# Patient Record
Sex: Male | Born: 1935 | Race: Black or African American | Hispanic: No | Smoking: Former smoker
Health system: Southern US, Community
[De-identification: ages and names within clinical notes are randomized; demographics above are authoritative.]

## PROBLEM LIST (undated history)

## (undated) DIAGNOSIS — I2510 Atherosclerotic heart disease of native coronary artery without angina pectoris: Secondary | ICD-10-CM

## (undated) DIAGNOSIS — E119 Type 2 diabetes mellitus without complications: Secondary | ICD-10-CM

## (undated) DIAGNOSIS — I5032 Chronic diastolic (congestive) heart failure: Secondary | ICD-10-CM

## (undated) DIAGNOSIS — D509 Iron deficiency anemia, unspecified: Secondary | ICD-10-CM

## (undated) DIAGNOSIS — D649 Anemia, unspecified: Secondary | ICD-10-CM

## (undated) DIAGNOSIS — I10 Essential (primary) hypertension: Secondary | ICD-10-CM

## (undated) DIAGNOSIS — N183 Chronic kidney disease, stage 3 (moderate): Secondary | ICD-10-CM

## (undated) DIAGNOSIS — J309 Allergic rhinitis, unspecified: Secondary | ICD-10-CM

## (undated) DIAGNOSIS — IMO0001 Reserved for inherently not codable concepts without codable children: Secondary | ICD-10-CM

## (undated) DIAGNOSIS — I441 Atrioventricular block, second degree: Secondary | ICD-10-CM

## (undated) DIAGNOSIS — J189 Pneumonia, unspecified organism: Secondary | ICD-10-CM

## (undated) DIAGNOSIS — I499 Cardiac arrhythmia, unspecified: Secondary | ICD-10-CM

## (undated) DIAGNOSIS — R9389 Abnormal findings on diagnostic imaging of other specified body structures: Secondary | ICD-10-CM

## (undated) DIAGNOSIS — E785 Hyperlipidemia, unspecified: Secondary | ICD-10-CM

## (undated) DIAGNOSIS — I639 Cerebral infarction, unspecified: Secondary | ICD-10-CM

## (undated) DIAGNOSIS — I129 Hypertensive chronic kidney disease with stage 1 through stage 4 chronic kidney disease, or unspecified chronic kidney disease: Secondary | ICD-10-CM

## (undated) DIAGNOSIS — G4733 Obstructive sleep apnea (adult) (pediatric): Secondary | ICD-10-CM

## (undated) DIAGNOSIS — I509 Heart failure, unspecified: Secondary | ICD-10-CM

## (undated) DIAGNOSIS — J449 Chronic obstructive pulmonary disease, unspecified: Secondary | ICD-10-CM

## (undated) DIAGNOSIS — C61 Malignant neoplasm of prostate: Secondary | ICD-10-CM

## (undated) DIAGNOSIS — M1990 Unspecified osteoarthritis, unspecified site: Secondary | ICD-10-CM

## (undated) DIAGNOSIS — E669 Obesity, unspecified: Secondary | ICD-10-CM

## (undated) DIAGNOSIS — N289 Disorder of kidney and ureter, unspecified: Secondary | ICD-10-CM

## (undated) DIAGNOSIS — N400 Enlarged prostate without lower urinary tract symptoms: Secondary | ICD-10-CM

## (undated) HISTORY — DX: Type 2 diabetes mellitus without complications: E11.9

## (undated) HISTORY — PX: OTHER SURGICAL HISTORY: SHX169

## (undated) HISTORY — DX: Disorder of kidney and ureter, unspecified: N28.9

## (undated) HISTORY — DX: Iron deficiency anemia, unspecified: D50.9

## (undated) HISTORY — DX: Malignant neoplasm of prostate: C61

## (undated) HISTORY — DX: Obesity, unspecified: E66.9

## (undated) HISTORY — DX: Atrioventricular block, second degree: I44.1

## (undated) HISTORY — DX: Benign prostatic hyperplasia without lower urinary tract symptoms: N40.0

## (undated) HISTORY — DX: Hyperlipidemia, unspecified: E78.5

## (undated) HISTORY — DX: Unspecified osteoarthritis, unspecified site: M19.90

## (undated) HISTORY — DX: Heart failure, unspecified: I50.9

## (undated) HISTORY — DX: Cardiac arrhythmia, unspecified: I49.9

## (undated) HISTORY — DX: Allergic rhinitis, unspecified: J30.9

## (undated) HISTORY — DX: Essential (primary) hypertension: I10

## (undated) HISTORY — DX: Chronic diastolic (congestive) heart failure: I50.32

## (undated) HISTORY — DX: Obstructive sleep apnea (adult) (pediatric): G47.33

## (undated) HISTORY — PX: PROSTATE SURGERY: SHX751

## (undated) HISTORY — DX: Atherosclerotic heart disease of native coronary artery without angina pectoris: I25.10

## (undated) HISTORY — DX: Cerebral infarction, unspecified: I63.9

---

## 1997-11-04 ENCOUNTER — Inpatient Hospital Stay (HOSPITAL_COMMUNITY): Admission: EM | Admit: 1997-11-04 | Discharge: 1997-11-08 | Payer: Self-pay | Admitting: Emergency Medicine

## 1997-11-23 ENCOUNTER — Inpatient Hospital Stay (HOSPITAL_COMMUNITY): Admission: EM | Admit: 1997-11-23 | Discharge: 1997-11-26 | Payer: Self-pay | Admitting: Emergency Medicine

## 1997-11-29 ENCOUNTER — Encounter (HOSPITAL_COMMUNITY): Admission: RE | Admit: 1997-11-29 | Discharge: 1998-02-27 | Payer: Self-pay | Admitting: Specialist

## 1999-02-24 ENCOUNTER — Inpatient Hospital Stay (HOSPITAL_COMMUNITY): Admission: EM | Admit: 1999-02-24 | Discharge: 1999-02-26 | Payer: Self-pay | Admitting: Emergency Medicine

## 1999-02-24 ENCOUNTER — Encounter: Payer: Self-pay | Admitting: Emergency Medicine

## 1999-02-26 ENCOUNTER — Encounter: Payer: Self-pay | Admitting: Family Medicine

## 1999-04-06 ENCOUNTER — Inpatient Hospital Stay (HOSPITAL_COMMUNITY): Admission: EM | Admit: 1999-04-06 | Discharge: 1999-04-12 | Payer: Self-pay | Admitting: Emergency Medicine

## 1999-04-06 ENCOUNTER — Encounter: Payer: Self-pay | Admitting: Family Medicine

## 1999-04-06 ENCOUNTER — Encounter: Payer: Self-pay | Admitting: *Deleted

## 1999-04-09 ENCOUNTER — Encounter: Payer: Self-pay | Admitting: Neurology

## 2000-12-29 ENCOUNTER — Observation Stay (HOSPITAL_COMMUNITY): Admission: EM | Admit: 2000-12-29 | Discharge: 2000-12-30 | Payer: Self-pay | Admitting: Emergency Medicine

## 2000-12-29 ENCOUNTER — Encounter: Payer: Self-pay | Admitting: Emergency Medicine

## 2001-05-27 ENCOUNTER — Encounter: Payer: Self-pay | Admitting: Emergency Medicine

## 2001-05-27 ENCOUNTER — Inpatient Hospital Stay (HOSPITAL_COMMUNITY): Admission: EM | Admit: 2001-05-27 | Discharge: 2001-05-30 | Payer: Self-pay | Admitting: Emergency Medicine

## 2001-10-22 ENCOUNTER — Inpatient Hospital Stay (HOSPITAL_COMMUNITY): Admission: EM | Admit: 2001-10-22 | Discharge: 2001-10-28 | Payer: Self-pay | Admitting: Emergency Medicine

## 2001-10-22 ENCOUNTER — Encounter: Payer: Self-pay | Admitting: Emergency Medicine

## 2001-10-23 ENCOUNTER — Encounter: Payer: Self-pay | Admitting: Family Medicine

## 2001-10-24 ENCOUNTER — Encounter: Payer: Self-pay | Admitting: Family Medicine

## 2001-10-28 ENCOUNTER — Encounter: Payer: Self-pay | Admitting: Family Medicine

## 2001-12-31 ENCOUNTER — Encounter: Payer: Self-pay | Admitting: Emergency Medicine

## 2001-12-31 ENCOUNTER — Inpatient Hospital Stay (HOSPITAL_COMMUNITY): Admission: EM | Admit: 2001-12-31 | Discharge: 2002-01-03 | Payer: Self-pay | Admitting: Emergency Medicine

## 2002-01-02 ENCOUNTER — Encounter: Payer: Self-pay | Admitting: Family Medicine

## 2002-02-06 ENCOUNTER — Inpatient Hospital Stay (HOSPITAL_COMMUNITY): Admission: EM | Admit: 2002-02-06 | Discharge: 2002-02-11 | Payer: Self-pay | Admitting: Emergency Medicine

## 2002-02-06 ENCOUNTER — Encounter: Payer: Self-pay | Admitting: Emergency Medicine

## 2002-02-08 ENCOUNTER — Encounter: Payer: Self-pay | Admitting: Cardiology

## 2002-03-07 ENCOUNTER — Encounter: Payer: Self-pay | Admitting: Emergency Medicine

## 2002-03-07 ENCOUNTER — Emergency Department (HOSPITAL_COMMUNITY): Admission: EM | Admit: 2002-03-07 | Discharge: 2002-03-08 | Payer: Self-pay | Admitting: Emergency Medicine

## 2002-05-24 ENCOUNTER — Encounter: Payer: Self-pay | Admitting: Emergency Medicine

## 2002-05-24 ENCOUNTER — Inpatient Hospital Stay (HOSPITAL_COMMUNITY): Admission: EM | Admit: 2002-05-24 | Discharge: 2002-05-28 | Payer: Self-pay | Admitting: Emergency Medicine

## 2002-05-27 ENCOUNTER — Encounter: Payer: Self-pay | Admitting: Cardiology

## 2003-01-29 ENCOUNTER — Inpatient Hospital Stay (HOSPITAL_COMMUNITY): Admission: EM | Admit: 2003-01-29 | Discharge: 2003-02-03 | Payer: Self-pay | Admitting: Emergency Medicine

## 2003-04-09 ENCOUNTER — Emergency Department (HOSPITAL_COMMUNITY): Admission: EM | Admit: 2003-04-09 | Discharge: 2003-04-09 | Payer: Self-pay | Admitting: Emergency Medicine

## 2003-04-09 ENCOUNTER — Encounter (INDEPENDENT_AMBULATORY_CARE_PROVIDER_SITE_OTHER): Payer: Self-pay | Admitting: Specialist

## 2003-04-12 ENCOUNTER — Emergency Department (HOSPITAL_COMMUNITY): Admission: EM | Admit: 2003-04-12 | Discharge: 2003-04-12 | Payer: Self-pay | Admitting: Family Medicine

## 2003-04-16 ENCOUNTER — Emergency Department (HOSPITAL_COMMUNITY): Admission: EM | Admit: 2003-04-16 | Discharge: 2003-04-16 | Payer: Self-pay | Admitting: Family Medicine

## 2003-04-22 ENCOUNTER — Emergency Department (HOSPITAL_COMMUNITY): Admission: AD | Admit: 2003-04-22 | Discharge: 2003-04-22 | Payer: Self-pay | Admitting: Family Medicine

## 2003-05-25 ENCOUNTER — Inpatient Hospital Stay (HOSPITAL_COMMUNITY): Admission: EM | Admit: 2003-05-25 | Discharge: 2003-05-28 | Payer: Self-pay | Admitting: Emergency Medicine

## 2003-08-13 ENCOUNTER — Emergency Department (HOSPITAL_COMMUNITY): Admission: EM | Admit: 2003-08-13 | Discharge: 2003-08-13 | Payer: Self-pay | Admitting: Emergency Medicine

## 2003-08-25 ENCOUNTER — Ambulatory Visit (HOSPITAL_COMMUNITY): Admission: RE | Admit: 2003-08-25 | Discharge: 2003-08-25 | Payer: Self-pay | Admitting: Endocrinology

## 2003-08-30 ENCOUNTER — Ambulatory Visit (HOSPITAL_COMMUNITY): Admission: RE | Admit: 2003-08-30 | Discharge: 2003-08-30 | Payer: Self-pay | Admitting: Podiatry

## 2003-12-01 ENCOUNTER — Inpatient Hospital Stay (HOSPITAL_COMMUNITY): Admission: EM | Admit: 2003-12-01 | Discharge: 2003-12-07 | Payer: Self-pay | Admitting: Emergency Medicine

## 2003-12-16 ENCOUNTER — Emergency Department (HOSPITAL_COMMUNITY): Admission: EM | Admit: 2003-12-16 | Discharge: 2003-12-16 | Payer: Self-pay | Admitting: Emergency Medicine

## 2003-12-20 ENCOUNTER — Emergency Department (HOSPITAL_COMMUNITY): Admission: EM | Admit: 2003-12-20 | Discharge: 2003-12-20 | Payer: Self-pay | Admitting: Emergency Medicine

## 2004-01-25 ENCOUNTER — Ambulatory Visit: Payer: Self-pay | Admitting: Internal Medicine

## 2004-01-29 ENCOUNTER — Inpatient Hospital Stay (HOSPITAL_COMMUNITY): Admission: EM | Admit: 2004-01-29 | Discharge: 2004-02-05 | Payer: Self-pay

## 2004-02-10 ENCOUNTER — Encounter: Admission: RE | Admit: 2004-02-10 | Discharge: 2004-02-10 | Payer: Self-pay | Admitting: Cardiology

## 2004-02-23 ENCOUNTER — Ambulatory Visit: Payer: Self-pay | Admitting: Internal Medicine

## 2004-04-18 ENCOUNTER — Ambulatory Visit: Payer: Self-pay | Admitting: Internal Medicine

## 2004-05-03 ENCOUNTER — Ambulatory Visit: Payer: Self-pay | Admitting: Gastroenterology

## 2004-05-10 ENCOUNTER — Ambulatory Visit: Payer: Self-pay | Admitting: Gastroenterology

## 2004-08-09 ENCOUNTER — Ambulatory Visit: Payer: Self-pay | Admitting: Internal Medicine

## 2005-06-18 ENCOUNTER — Ambulatory Visit: Payer: Self-pay | Admitting: Internal Medicine

## 2006-04-12 ENCOUNTER — Ambulatory Visit: Payer: Self-pay | Admitting: Internal Medicine

## 2006-04-15 ENCOUNTER — Emergency Department (HOSPITAL_COMMUNITY): Admission: EM | Admit: 2006-04-15 | Discharge: 2006-04-15 | Payer: Self-pay | Admitting: Emergency Medicine

## 2006-09-02 ENCOUNTER — Emergency Department (HOSPITAL_COMMUNITY): Admission: EM | Admit: 2006-09-02 | Discharge: 2006-09-02 | Payer: Self-pay | Admitting: Emergency Medicine

## 2006-10-11 ENCOUNTER — Ambulatory Visit: Payer: Self-pay | Admitting: Internal Medicine

## 2006-10-11 DIAGNOSIS — I10 Essential (primary) hypertension: Secondary | ICD-10-CM

## 2006-10-11 DIAGNOSIS — M1990 Unspecified osteoarthritis, unspecified site: Secondary | ICD-10-CM | POA: Insufficient documentation

## 2006-10-11 DIAGNOSIS — N400 Enlarged prostate without lower urinary tract symptoms: Secondary | ICD-10-CM

## 2006-10-11 DIAGNOSIS — E785 Hyperlipidemia, unspecified: Secondary | ICD-10-CM

## 2006-10-11 DIAGNOSIS — Z8679 Personal history of other diseases of the circulatory system: Secondary | ICD-10-CM | POA: Insufficient documentation

## 2006-10-11 LAB — CONVERTED CEMR LAB
Basophils Relative: 0.8 % (ref 0.0–1.0)
Calcium: 10.3 mg/dL (ref 8.4–10.5)
Chloride: 110 meq/L (ref 96–112)
Cholesterol: 135 mg/dL (ref 0–200)
Creatinine, Ser: 1.7 mg/dL — ABNORMAL HIGH (ref 0.4–1.5)
Eosinophils Absolute: 0.3 10*3/uL (ref 0.0–0.6)
Hemoglobin: 11 g/dL — ABNORMAL LOW (ref 13.0–17.0)
Hgb A1c MFr Bld: 8.8 % — ABNORMAL HIGH (ref 4.6–6.0)
Ketones, ur: NEGATIVE mg/dL
LDL Cholesterol: 75 mg/dL (ref 0–99)
Leukocytes, UA: NEGATIVE
MCHC: 33 g/dL (ref 30.0–36.0)
Monocytes Absolute: 0.8 10*3/uL — ABNORMAL HIGH (ref 0.2–0.7)
Neutrophils Relative %: 62.6 % (ref 43.0–77.0)
Nitrite: NEGATIVE
PSA: 0.27 ng/mL (ref 0.10–4.00)
Potassium: 4 meq/L (ref 3.5–5.1)
RBC: 3.97 M/uL — ABNORMAL LOW (ref 4.22–5.81)
Specific Gravity, Urine: 1.025 (ref 1.000–1.03)
TSH: 7.48 microintl units/mL — ABNORMAL HIGH (ref 0.35–5.50)
Total CHOL/HDL Ratio: 4.4
Urine Glucose: NEGATIVE mg/dL
VLDL: 29 mg/dL (ref 0–40)
WBC: 7 10*3/uL (ref 4.5–10.5)
pH: 5.5 (ref 5.0–8.0)

## 2006-12-12 ENCOUNTER — Ambulatory Visit: Payer: Self-pay | Admitting: Internal Medicine

## 2006-12-12 LAB — CONVERTED CEMR LAB
Albumin: 3.8 g/dL (ref 3.5–5.2)
Alkaline Phosphatase: 62 units/L (ref 39–117)
BUN: 25 mg/dL — ABNORMAL HIGH (ref 6–23)
CO2: 29 meq/L (ref 19–32)
Chloride: 107 meq/L (ref 96–112)
Creatinine, Ser: 1.9 mg/dL — ABNORMAL HIGH (ref 0.4–1.5)
Free T4: 0.7 ng/dL (ref 0.6–1.6)
GFR calc Af Amer: 45 mL/min
Potassium: 4 meq/L (ref 3.5–5.1)
Sodium: 142 meq/L (ref 135–145)

## 2006-12-30 ENCOUNTER — Ambulatory Visit: Payer: Self-pay

## 2006-12-30 ENCOUNTER — Encounter: Payer: Self-pay | Admitting: Internal Medicine

## 2006-12-30 LAB — CONVERTED CEMR LAB
CO2: 26 meq/L (ref 19–32)
Creatinine, Ser: 2.3 mg/dL — ABNORMAL HIGH (ref 0.4–1.5)
Sodium: 143 meq/L (ref 135–145)

## 2007-01-06 ENCOUNTER — Ambulatory Visit: Payer: Self-pay | Admitting: Internal Medicine

## 2007-01-06 LAB — CONVERTED CEMR LAB
BUN: 30 mg/dL — ABNORMAL HIGH (ref 6–23)
Chloride: 109 meq/L (ref 96–112)
Creatinine, Ser: 1.8 mg/dL — ABNORMAL HIGH (ref 0.4–1.5)
Glucose, Bld: 125 mg/dL — ABNORMAL HIGH (ref 70–99)
Pro B Natriuretic peptide (BNP): 209 pg/mL — ABNORMAL HIGH (ref 0.0–100.0)

## 2007-01-23 ENCOUNTER — Ambulatory Visit: Payer: Self-pay | Admitting: Internal Medicine

## 2007-01-23 LAB — CONVERTED CEMR LAB
Chloride: 106 meq/L (ref 96–112)
GFR calc Af Amer: 42 mL/min
Glucose, Bld: 101 mg/dL — ABNORMAL HIGH (ref 70–99)
Potassium: 3.8 meq/L (ref 3.5–5.1)
Pro B Natriuretic peptide (BNP): 97 pg/mL (ref 0.0–100.0)
Sodium: 143 meq/L (ref 135–145)

## 2007-01-24 ENCOUNTER — Encounter: Payer: Self-pay | Admitting: Internal Medicine

## 2007-01-31 ENCOUNTER — Encounter: Payer: Self-pay | Admitting: Internal Medicine

## 2007-01-31 ENCOUNTER — Ambulatory Visit: Payer: Self-pay | Admitting: Internal Medicine

## 2007-02-21 ENCOUNTER — Ambulatory Visit: Payer: Self-pay | Admitting: Internal Medicine

## 2007-02-21 ENCOUNTER — Ambulatory Visit (HOSPITAL_COMMUNITY): Admission: RE | Admit: 2007-02-21 | Discharge: 2007-02-21 | Payer: Self-pay | Admitting: Internal Medicine

## 2007-02-21 LAB — CONVERTED CEMR LAB
AST: 14 units/L (ref 0–37)
Alkaline Phosphatase: 69 units/L (ref 39–117)
CO2: 29 meq/L (ref 19–32)
Chloride: 106 meq/L (ref 96–112)
Cholesterol: 130 mg/dL (ref 0–200)
LDL Cholesterol: 76 mg/dL (ref 0–99)
Sodium: 141 meq/L (ref 135–145)
T3, Free: 2.5 pg/mL (ref 2.3–4.2)
TSH: 11.59 microintl units/mL — ABNORMAL HIGH (ref 0.35–5.50)
Total Protein: 7.3 g/dL (ref 6.0–8.3)
Triglycerides: 107 mg/dL (ref 0–149)
VLDL: 21 mg/dL (ref 0–40)

## 2007-03-28 ENCOUNTER — Ambulatory Visit: Payer: Self-pay | Admitting: Internal Medicine

## 2007-03-28 LAB — CONVERTED CEMR LAB
BUN: 87 mg/dL (ref 6–23)
Chloride: 107 meq/L (ref 96–112)
Creatinine, Ser: 2.8 mg/dL — ABNORMAL HIGH (ref 0.4–1.5)
Free T4: 0.7 ng/dL (ref 0.6–1.6)
Glucose, Bld: 136 mg/dL — ABNORMAL HIGH (ref 70–99)
Sodium: 141 meq/L (ref 135–145)
TSH: 8.21 microintl units/mL — ABNORMAL HIGH (ref 0.35–5.50)

## 2007-04-11 ENCOUNTER — Ambulatory Visit: Payer: Self-pay | Admitting: Internal Medicine

## 2007-04-11 LAB — CONVERTED CEMR LAB
GFR calc Af Amer: 48 mL/min
GFR calc non Af Amer: 40 mL/min
Glucose, Bld: 117 mg/dL — ABNORMAL HIGH (ref 70–99)

## 2007-05-02 ENCOUNTER — Ambulatory Visit: Payer: Self-pay | Admitting: Internal Medicine

## 2007-05-02 LAB — CONVERTED CEMR LAB
BUN: 39 mg/dL — ABNORMAL HIGH (ref 6–23)
CO2: 25 meq/L (ref 19–32)
Chloride: 115 meq/L — ABNORMAL HIGH (ref 96–112)
GFR calc non Af Amer: 40 mL/min
Sodium: 145 meq/L (ref 135–145)

## 2007-05-19 ENCOUNTER — Telehealth (INDEPENDENT_AMBULATORY_CARE_PROVIDER_SITE_OTHER): Payer: Self-pay | Admitting: *Deleted

## 2007-05-21 ENCOUNTER — Ambulatory Visit: Payer: Self-pay | Admitting: Internal Medicine

## 2007-06-06 ENCOUNTER — Ambulatory Visit: Payer: Self-pay | Admitting: Pulmonary Disease

## 2007-06-06 ENCOUNTER — Ambulatory Visit: Payer: Self-pay | Admitting: Internal Medicine

## 2007-06-06 DIAGNOSIS — G4733 Obstructive sleep apnea (adult) (pediatric): Secondary | ICD-10-CM | POA: Insufficient documentation

## 2007-06-06 LAB — CONVERTED CEMR LAB
BUN: 38 mg/dL — ABNORMAL HIGH (ref 6–23)
CO2: 29 meq/L (ref 19–32)
Calcium: 9.7 mg/dL (ref 8.4–10.5)
Chloride: 107 meq/L (ref 96–112)
GFR calc non Af Amer: 33 mL/min
Glucose, Bld: 127 mg/dL — ABNORMAL HIGH (ref 70–99)

## 2007-06-13 ENCOUNTER — Ambulatory Visit: Payer: Self-pay | Admitting: Cardiology

## 2007-06-13 ENCOUNTER — Inpatient Hospital Stay (HOSPITAL_COMMUNITY): Admission: AD | Admit: 2007-06-13 | Discharge: 2007-06-17 | Payer: Self-pay | Admitting: Cardiology

## 2007-06-14 ENCOUNTER — Encounter: Payer: Self-pay | Admitting: Cardiology

## 2007-06-20 ENCOUNTER — Ambulatory Visit: Payer: Self-pay | Admitting: Internal Medicine

## 2007-06-20 ENCOUNTER — Inpatient Hospital Stay (HOSPITAL_COMMUNITY): Admission: AD | Admit: 2007-06-20 | Discharge: 2007-06-25 | Payer: Self-pay | Admitting: Internal Medicine

## 2007-06-30 ENCOUNTER — Ambulatory Visit: Payer: Self-pay | Admitting: Internal Medicine

## 2007-06-30 ENCOUNTER — Ambulatory Visit: Payer: Self-pay | Admitting: Cardiology

## 2007-06-30 LAB — CONVERTED CEMR LAB
Creatinine, Ser: 3.2 mg/dL — ABNORMAL HIGH (ref 0.4–1.5)
GFR calc Af Amer: 25 mL/min
Potassium: 3.7 meq/L (ref 3.5–5.1)
Sodium: 141 meq/L (ref 135–145)

## 2007-07-09 ENCOUNTER — Ambulatory Visit: Payer: Self-pay | Admitting: Internal Medicine

## 2007-07-09 LAB — CONVERTED CEMR LAB
BUN: 51 mg/dL — ABNORMAL HIGH (ref 6–23)
CO2: 26 meq/L (ref 19–32)
Calcium: 9.2 mg/dL (ref 8.4–10.5)
Chloride: 102 meq/L (ref 96–112)
Creatinine, Ser: 2.1 mg/dL — ABNORMAL HIGH (ref 0.4–1.5)
GFR calc Af Amer: 40 mL/min
GFR calc non Af Amer: 33 mL/min
Potassium: 3.9 meq/L (ref 3.5–5.1)
Sodium: 139 meq/L (ref 135–145)

## 2007-07-28 ENCOUNTER — Ambulatory Visit: Payer: Self-pay | Admitting: Cardiology

## 2007-08-08 ENCOUNTER — Ambulatory Visit: Payer: Self-pay | Admitting: Internal Medicine

## 2007-09-16 ENCOUNTER — Ambulatory Visit: Payer: Self-pay | Admitting: Internal Medicine

## 2007-09-25 ENCOUNTER — Ambulatory Visit: Payer: Self-pay | Admitting: Internal Medicine

## 2007-09-25 LAB — CONVERTED CEMR LAB
CO2: 28 meq/L (ref 19–32)
Creatinine, Ser: 2.4 mg/dL — ABNORMAL HIGH (ref 0.4–1.5)
GFR calc Af Amer: 34 mL/min
GFR calc non Af Amer: 28 mL/min
Sodium: 143 meq/L (ref 135–145)

## 2007-10-28 ENCOUNTER — Telehealth: Payer: Self-pay | Admitting: Internal Medicine

## 2007-12-18 ENCOUNTER — Ambulatory Visit: Payer: Self-pay | Admitting: Internal Medicine

## 2007-12-25 ENCOUNTER — Ambulatory Visit: Payer: Self-pay | Admitting: Cardiology

## 2008-01-06 ENCOUNTER — Inpatient Hospital Stay (HOSPITAL_COMMUNITY): Admission: EM | Admit: 2008-01-06 | Discharge: 2008-01-11 | Payer: Self-pay | Admitting: Emergency Medicine

## 2008-01-06 ENCOUNTER — Ambulatory Visit: Payer: Self-pay | Admitting: Internal Medicine

## 2008-01-06 ENCOUNTER — Ambulatory Visit: Payer: Self-pay | Admitting: Cardiology

## 2008-01-22 ENCOUNTER — Ambulatory Visit: Payer: Self-pay | Admitting: Cardiology

## 2008-01-22 DIAGNOSIS — I480 Paroxysmal atrial fibrillation: Secondary | ICD-10-CM

## 2008-01-22 LAB — CONVERTED CEMR LAB
BUN: 25 mg/dL — ABNORMAL HIGH (ref 6–23)
CO2: 28 meq/L (ref 19–32)
Calcium: 9.4 mg/dL (ref 8.4–10.5)
Creatinine, Ser: 1.7 mg/dL — ABNORMAL HIGH (ref 0.4–1.5)
Glucose, Bld: 147 mg/dL — ABNORMAL HIGH (ref 70–99)

## 2008-02-26 ENCOUNTER — Ambulatory Visit: Payer: Self-pay | Admitting: Internal Medicine

## 2008-04-12 ENCOUNTER — Ambulatory Visit: Payer: Self-pay | Admitting: Internal Medicine

## 2008-04-12 ENCOUNTER — Encounter: Payer: Self-pay | Admitting: Nurse Practitioner

## 2008-04-12 DIAGNOSIS — R9431 Abnormal electrocardiogram [ECG] [EKG]: Secondary | ICD-10-CM

## 2008-05-05 ENCOUNTER — Inpatient Hospital Stay (HOSPITAL_COMMUNITY): Admission: EM | Admit: 2008-05-05 | Discharge: 2008-05-08 | Payer: Self-pay | Admitting: Emergency Medicine

## 2008-05-05 ENCOUNTER — Ambulatory Visit: Payer: Self-pay | Admitting: Cardiovascular Disease

## 2008-05-15 ENCOUNTER — Inpatient Hospital Stay (HOSPITAL_COMMUNITY): Admission: EM | Admit: 2008-05-15 | Discharge: 2008-05-19 | Payer: Self-pay | Admitting: Emergency Medicine

## 2008-05-15 ENCOUNTER — Ambulatory Visit: Payer: Self-pay | Admitting: Internal Medicine

## 2008-05-16 ENCOUNTER — Encounter: Payer: Self-pay | Admitting: Internal Medicine

## 2008-05-17 ENCOUNTER — Ambulatory Visit: Payer: Self-pay | Admitting: Gastroenterology

## 2008-05-18 ENCOUNTER — Encounter: Payer: Self-pay | Admitting: Gastroenterology

## 2008-05-18 LAB — HM COLONOSCOPY

## 2008-05-20 ENCOUNTER — Telehealth (INDEPENDENT_AMBULATORY_CARE_PROVIDER_SITE_OTHER): Payer: Self-pay | Admitting: *Deleted

## 2008-05-25 ENCOUNTER — Ambulatory Visit: Payer: Self-pay | Admitting: Internal Medicine

## 2008-05-25 ENCOUNTER — Encounter: Payer: Self-pay | Admitting: Internal Medicine

## 2008-05-25 DIAGNOSIS — I2510 Atherosclerotic heart disease of native coronary artery without angina pectoris: Secondary | ICD-10-CM | POA: Insufficient documentation

## 2008-05-25 DIAGNOSIS — Z860100 Personal history of colon polyps, unspecified: Secondary | ICD-10-CM | POA: Insufficient documentation

## 2008-05-25 DIAGNOSIS — M109 Gout, unspecified: Secondary | ICD-10-CM

## 2008-05-25 DIAGNOSIS — E039 Hypothyroidism, unspecified: Secondary | ICD-10-CM | POA: Insufficient documentation

## 2008-05-25 DIAGNOSIS — K279 Peptic ulcer, site unspecified, unspecified as acute or chronic, without hemorrhage or perforation: Secondary | ICD-10-CM | POA: Insufficient documentation

## 2008-05-25 DIAGNOSIS — F411 Generalized anxiety disorder: Secondary | ICD-10-CM | POA: Insufficient documentation

## 2008-05-25 DIAGNOSIS — F1021 Alcohol dependence, in remission: Secondary | ICD-10-CM

## 2008-05-25 DIAGNOSIS — Z8546 Personal history of malignant neoplasm of prostate: Secondary | ICD-10-CM

## 2008-05-25 DIAGNOSIS — I441 Atrioventricular block, second degree: Secondary | ICD-10-CM

## 2008-05-26 ENCOUNTER — Encounter: Payer: Self-pay | Admitting: Internal Medicine

## 2008-05-26 LAB — CONVERTED CEMR LAB
ALT: 20 units/L (ref 0–53)
Basophils Absolute: 0.1 10*3/uL (ref 0.0–0.1)
Basophils Relative: 0.7 % (ref 0.0–3.0)
CO2: 31 meq/L (ref 19–32)
Cholesterol: 106 mg/dL (ref 0–200)
Creatinine, Ser: 3.6 mg/dL — ABNORMAL HIGH (ref 0.4–1.5)
Eosinophils Relative: 3.4 % (ref 0.0–5.0)
GFR calc non Af Amer: 21.43 mL/min (ref >60)
Glucose, Bld: 279 mg/dL — ABNORMAL HIGH (ref 70–99)
HCT: 32.1 % — ABNORMAL LOW (ref 39.0–52.0)
HDL: 31.9 mg/dL — ABNORMAL LOW (ref >39.00)
LDL Cholesterol: 39 mg/dL (ref 0–99)
Lymphs Abs: 1.2 10*3/uL (ref 0.7–4.0)
MCHC: 32.5 g/dL (ref 30.0–36.0)
Monocytes Relative: 10.8 % (ref 3.0–12.0)
Neutrophils Relative %: 72.2 % (ref 43.0–77.0)
RDW: 19.3 % — ABNORMAL HIGH (ref 11.5–14.6)
TSH: 5.57 microintl units/mL — ABNORMAL HIGH (ref 0.35–5.50)
Total Protein: 7.3 g/dL (ref 6.0–8.3)
Urine Glucose: NEGATIVE mg/dL
pH: 6 (ref 5.0–8.0)

## 2008-05-31 ENCOUNTER — Telehealth: Payer: Self-pay | Admitting: Internal Medicine

## 2008-06-03 ENCOUNTER — Encounter: Payer: Self-pay | Admitting: Internal Medicine

## 2008-06-03 ENCOUNTER — Inpatient Hospital Stay (HOSPITAL_COMMUNITY): Admission: EM | Admit: 2008-06-03 | Discharge: 2008-06-08 | Payer: Self-pay | Admitting: Emergency Medicine

## 2008-06-03 ENCOUNTER — Ambulatory Visit: Payer: Self-pay | Admitting: Internal Medicine

## 2008-06-03 DIAGNOSIS — N183 Chronic kidney disease, stage 3 unspecified: Secondary | ICD-10-CM | POA: Insufficient documentation

## 2008-06-03 DIAGNOSIS — I5032 Chronic diastolic (congestive) heart failure: Secondary | ICD-10-CM

## 2008-06-03 DIAGNOSIS — D649 Anemia, unspecified: Secondary | ICD-10-CM

## 2008-06-03 DIAGNOSIS — E876 Hypokalemia: Secondary | ICD-10-CM

## 2008-06-03 DIAGNOSIS — E1129 Type 2 diabetes mellitus with other diabetic kidney complication: Secondary | ICD-10-CM | POA: Insufficient documentation

## 2008-06-08 ENCOUNTER — Telehealth: Payer: Self-pay | Admitting: Internal Medicine

## 2008-06-09 ENCOUNTER — Encounter: Payer: Self-pay | Admitting: Internal Medicine

## 2008-06-10 ENCOUNTER — Telehealth: Payer: Self-pay | Admitting: Internal Medicine

## 2008-06-10 ENCOUNTER — Encounter: Payer: Self-pay | Admitting: Internal Medicine

## 2008-06-15 ENCOUNTER — Encounter: Payer: Self-pay | Admitting: Internal Medicine

## 2008-06-18 ENCOUNTER — Telehealth: Payer: Self-pay | Admitting: Internal Medicine

## 2008-06-22 ENCOUNTER — Encounter: Payer: Self-pay | Admitting: Internal Medicine

## 2008-06-28 ENCOUNTER — Telehealth: Payer: Self-pay | Admitting: Internal Medicine

## 2008-06-29 ENCOUNTER — Encounter: Payer: Self-pay | Admitting: Internal Medicine

## 2008-07-13 ENCOUNTER — Ambulatory Visit: Payer: Self-pay | Admitting: Internal Medicine

## 2008-07-14 ENCOUNTER — Telehealth: Payer: Self-pay | Admitting: Internal Medicine

## 2008-07-15 ENCOUNTER — Telehealth: Payer: Self-pay | Admitting: Internal Medicine

## 2008-07-15 LAB — CONVERTED CEMR LAB
BUN: 55 mg/dL — ABNORMAL HIGH (ref 6–23)
Basophils Absolute: 0 10*3/uL (ref 0.0–0.1)
Basophils Relative: 0.2 % (ref 0.0–3.0)
CO2: 31 meq/L (ref 19–32)
Calcium: 9.3 mg/dL (ref 8.4–10.5)
Chloride: 100 meq/L (ref 96–112)
Hemoglobin: 10.6 g/dL — ABNORMAL LOW (ref 13.0–17.0)
Hgb A1c MFr Bld: 8.3 % — ABNORMAL HIGH (ref 4.6–6.5)
Lymphocytes Relative: 15.2 % (ref 12.0–46.0)
Lymphs Abs: 1.4 10*3/uL (ref 0.7–4.0)
Monocytes Relative: 9.5 % (ref 3.0–12.0)
Neutro Abs: 6.3 10*3/uL (ref 1.4–7.7)
Potassium: 3 meq/L — ABNORMAL LOW (ref 3.5–5.1)
RBC: 4.02 M/uL — ABNORMAL LOW (ref 4.22–5.81)
RDW: 17.6 % — ABNORMAL HIGH (ref 11.5–14.6)
Saturation Ratios: 17.3 % — ABNORMAL LOW (ref 20.0–50.0)
Sodium: 139 meq/L (ref 135–145)
Vitamin B-12: 728 pg/mL (ref 211–911)

## 2008-07-20 ENCOUNTER — Telehealth: Payer: Self-pay | Admitting: Internal Medicine

## 2008-07-28 ENCOUNTER — Telehealth (INDEPENDENT_AMBULATORY_CARE_PROVIDER_SITE_OTHER): Payer: Self-pay | Admitting: *Deleted

## 2008-08-11 ENCOUNTER — Telehealth: Payer: Self-pay | Admitting: Internal Medicine

## 2008-10-14 ENCOUNTER — Ambulatory Visit: Payer: Self-pay | Admitting: Internal Medicine

## 2008-10-14 DIAGNOSIS — G4700 Insomnia, unspecified: Secondary | ICD-10-CM | POA: Insufficient documentation

## 2008-10-21 ENCOUNTER — Ambulatory Visit: Payer: Self-pay | Admitting: Internal Medicine

## 2008-10-21 LAB — CONVERTED CEMR LAB
Calcium: 9 mg/dL (ref 8.4–10.5)
GFR calc non Af Amer: 39.87 mL/min (ref >60)
Glucose, Bld: 337 mg/dL — ABNORMAL HIGH (ref 70–99)
Potassium: 3.6 meq/L (ref 3.5–5.1)
Sodium: 139 meq/L (ref 135–145)

## 2008-11-15 ENCOUNTER — Telehealth: Payer: Self-pay | Admitting: Internal Medicine

## 2008-11-26 ENCOUNTER — Ambulatory Visit: Payer: Self-pay | Admitting: Internal Medicine

## 2008-11-27 LAB — CONVERTED CEMR LAB
Free T4: 0.9 ng/dL (ref 0.6–1.6)
T3, Free: 1.9 pg/mL — ABNORMAL LOW (ref 2.3–4.2)
TSH: 2.35 microintl units/mL (ref 0.35–5.50)

## 2008-11-30 ENCOUNTER — Encounter: Payer: Self-pay | Admitting: Internal Medicine

## 2008-12-21 ENCOUNTER — Ambulatory Visit: Payer: Self-pay | Admitting: Internal Medicine

## 2009-01-27 ENCOUNTER — Encounter (INDEPENDENT_AMBULATORY_CARE_PROVIDER_SITE_OTHER): Payer: Self-pay | Admitting: Nurse Practitioner

## 2009-03-15 ENCOUNTER — Telehealth: Payer: Self-pay | Admitting: Internal Medicine

## 2009-03-15 ENCOUNTER — Ambulatory Visit: Payer: Self-pay | Admitting: Internal Medicine

## 2009-03-15 DIAGNOSIS — I5033 Acute on chronic diastolic (congestive) heart failure: Secondary | ICD-10-CM

## 2009-03-15 DIAGNOSIS — G609 Hereditary and idiopathic neuropathy, unspecified: Secondary | ICD-10-CM | POA: Insufficient documentation

## 2009-03-31 ENCOUNTER — Ambulatory Visit: Payer: Self-pay | Admitting: Internal Medicine

## 2009-03-31 LAB — CONVERTED CEMR LAB
BUN: 39 mg/dL — ABNORMAL HIGH (ref 6–23)
Chloride: 103 meq/L (ref 96–112)
Creatinine, Ser: 1.7 mg/dL — ABNORMAL HIGH (ref 0.4–1.5)
GFR calc non Af Amer: 50.83 mL/min (ref >60)
Glucose, Bld: 177 mg/dL — ABNORMAL HIGH (ref 70–99)
Potassium: 3.6 meq/L (ref 3.5–5.1)

## 2009-04-11 ENCOUNTER — Ambulatory Visit: Payer: Self-pay | Admitting: Internal Medicine

## 2009-04-18 ENCOUNTER — Ambulatory Visit: Payer: Self-pay | Admitting: Internal Medicine

## 2009-04-19 ENCOUNTER — Encounter (INDEPENDENT_AMBULATORY_CARE_PROVIDER_SITE_OTHER): Payer: Self-pay | Admitting: *Deleted

## 2009-04-22 LAB — CONVERTED CEMR LAB
Albumin: 3.2 g/dL — ABNORMAL LOW (ref 3.5–5.2)
CO2: 31 meq/L (ref 19–32)
Calcium: 8.9 mg/dL (ref 8.4–10.5)
Creatinine, Ser: 1.9 mg/dL — ABNORMAL HIGH (ref 0.4–1.5)
GFR calc non Af Amer: 44.7 mL/min (ref >60)
Glucose, Bld: 199 mg/dL — ABNORMAL HIGH (ref 70–99)
TSH: 2.08 microintl units/mL (ref 0.35–5.50)
Total Protein: 6.8 g/dL (ref 6.0–8.3)

## 2009-04-25 ENCOUNTER — Inpatient Hospital Stay (HOSPITAL_COMMUNITY): Admission: EM | Admit: 2009-04-25 | Discharge: 2009-04-29 | Payer: Self-pay | Admitting: Emergency Medicine

## 2009-04-25 ENCOUNTER — Ambulatory Visit: Payer: Self-pay | Admitting: Cardiology

## 2009-04-27 ENCOUNTER — Encounter: Payer: Self-pay | Admitting: Cardiology

## 2009-04-29 ENCOUNTER — Encounter: Payer: Self-pay | Admitting: Internal Medicine

## 2009-05-03 ENCOUNTER — Telehealth: Payer: Self-pay | Admitting: Internal Medicine

## 2009-05-17 ENCOUNTER — Ambulatory Visit: Payer: Self-pay | Admitting: Internal Medicine

## 2009-05-20 ENCOUNTER — Emergency Department (HOSPITAL_COMMUNITY): Admission: EM | Admit: 2009-05-20 | Discharge: 2009-05-20 | Payer: Self-pay | Admitting: Emergency Medicine

## 2009-05-23 ENCOUNTER — Telehealth: Payer: Self-pay | Admitting: Internal Medicine

## 2009-05-24 ENCOUNTER — Ambulatory Visit: Payer: Self-pay | Admitting: Internal Medicine

## 2009-05-24 ENCOUNTER — Telehealth: Payer: Self-pay | Admitting: Internal Medicine

## 2009-06-03 ENCOUNTER — Ambulatory Visit: Payer: Self-pay | Admitting: Internal Medicine

## 2009-06-04 DIAGNOSIS — N259 Disorder resulting from impaired renal tubular function, unspecified: Secondary | ICD-10-CM | POA: Insufficient documentation

## 2009-06-06 LAB — CONVERTED CEMR LAB
ALT: 18 units/L (ref 0–53)
AST: 19 units/L (ref 0–37)
Albumin: 3.2 g/dL — ABNORMAL LOW (ref 3.5–5.2)
Alkaline Phosphatase: 79 units/L (ref 39–117)
Basophils Absolute: 0 10*3/uL (ref 0.0–0.1)
Basophils Relative: 0.4 % (ref 0.0–3.0)
Calcium: 9 mg/dL (ref 8.4–10.5)
Eosinophils Relative: 2.9 % (ref 0.0–5.0)
GFR calc non Af Amer: 50.8 mL/min (ref >60)
HDL: 43.4 mg/dL (ref >39.00)
Hemoglobin: 10.3 g/dL — ABNORMAL LOW (ref 13.0–17.0)
Hgb A1c MFr Bld: 7.7 % — ABNORMAL HIGH (ref 4.6–6.5)
Ketones, ur: NEGATIVE mg/dL
Leukocytes, UA: NEGATIVE
Lymphocytes Relative: 16.8 % (ref 12.0–46.0)
Monocytes Relative: 13.6 % — ABNORMAL HIGH (ref 3.0–12.0)
Neutro Abs: 5.8 10*3/uL (ref 1.4–7.7)
Nitrite: NEGATIVE
Potassium: 4.4 meq/L (ref 3.5–5.1)
Pro B Natriuretic peptide (BNP): 251 pg/mL — ABNORMAL HIGH (ref 0.0–100.0)
RBC: 4.63 M/uL (ref 4.22–5.81)
Saturation Ratios: 6.7 % — ABNORMAL LOW (ref 20.0–50.0)
Sodium: 144 meq/L (ref 135–145)
Specific Gravity, Urine: 1.02 (ref 1.000–1.030)
Total CHOL/HDL Ratio: 3
Total Protein: 7.2 g/dL (ref 6.0–8.3)
WBC: 8.7 10*3/uL (ref 4.5–10.5)
pH: 7 (ref 5.0–8.0)

## 2009-06-09 ENCOUNTER — Telehealth (INDEPENDENT_AMBULATORY_CARE_PROVIDER_SITE_OTHER): Payer: Self-pay | Admitting: *Deleted

## 2009-06-13 ENCOUNTER — Encounter: Payer: Self-pay | Admitting: Internal Medicine

## 2009-06-21 ENCOUNTER — Ambulatory Visit: Payer: Self-pay | Admitting: Cardiology

## 2009-06-21 ENCOUNTER — Inpatient Hospital Stay (HOSPITAL_COMMUNITY): Admission: EM | Admit: 2009-06-21 | Discharge: 2009-06-27 | Payer: Self-pay | Admitting: Emergency Medicine

## 2009-06-29 ENCOUNTER — Ambulatory Visit: Payer: Self-pay | Admitting: Internal Medicine

## 2009-06-29 LAB — CONVERTED CEMR LAB
Calcium: 9.8 mg/dL (ref 8.4–10.5)
GFR calc non Af Amer: 45.22 mL/min (ref >60)
Sodium: 146 meq/L — ABNORMAL HIGH (ref 135–145)

## 2009-07-05 ENCOUNTER — Encounter: Payer: Self-pay | Admitting: Internal Medicine

## 2009-07-11 ENCOUNTER — Telehealth: Payer: Self-pay | Admitting: Internal Medicine

## 2009-07-11 ENCOUNTER — Observation Stay (HOSPITAL_COMMUNITY): Admission: EM | Admit: 2009-07-11 | Discharge: 2009-07-14 | Payer: Self-pay | Admitting: Emergency Medicine

## 2009-07-15 ENCOUNTER — Encounter: Payer: Self-pay | Admitting: Internal Medicine

## 2009-07-19 ENCOUNTER — Ambulatory Visit: Payer: Self-pay | Admitting: Internal Medicine

## 2009-07-20 ENCOUNTER — Encounter: Payer: Self-pay | Admitting: Internal Medicine

## 2009-07-21 ENCOUNTER — Telehealth: Payer: Self-pay | Admitting: Internal Medicine

## 2009-08-04 ENCOUNTER — Ambulatory Visit: Payer: Self-pay | Admitting: Internal Medicine

## 2009-09-19 ENCOUNTER — Ambulatory Visit: Payer: Self-pay | Admitting: Internal Medicine

## 2009-09-20 LAB — CONVERTED CEMR LAB
Chloride: 102 meq/L (ref 96–112)
Cholesterol: 149 mg/dL (ref 0–200)
HDL: 31.2 mg/dL — ABNORMAL LOW (ref >39.00)
Hgb A1c MFr Bld: 10.3 % — ABNORMAL HIGH (ref 4.6–6.5)
Potassium: 4.8 meq/L (ref 3.5–5.1)
Sodium: 137 meq/L (ref 135–145)
VLDL: 47.4 mg/dL — ABNORMAL HIGH (ref 0.0–40.0)

## 2009-11-21 ENCOUNTER — Telehealth: Payer: Self-pay | Admitting: Internal Medicine

## 2009-11-28 ENCOUNTER — Ambulatory Visit: Payer: Self-pay | Admitting: Internal Medicine

## 2009-12-06 ENCOUNTER — Ambulatory Visit: Payer: Self-pay | Admitting: Internal Medicine

## 2009-12-07 ENCOUNTER — Telehealth (INDEPENDENT_AMBULATORY_CARE_PROVIDER_SITE_OTHER): Payer: Self-pay | Admitting: *Deleted

## 2009-12-07 LAB — CONVERTED CEMR LAB
BUN: 36 mg/dL — ABNORMAL HIGH (ref 6–23)
Calcium: 9.5 mg/dL (ref 8.4–10.5)
GFR calc non Af Amer: 36.34 mL/min (ref >60)
Glucose, Bld: 280 mg/dL — ABNORMAL HIGH (ref 70–99)
HDL: 28.2 mg/dL — ABNORMAL LOW (ref >39.00)
LDL Cholesterol: 54 mg/dL (ref 0–99)
Total CHOL/HDL Ratio: 4

## 2009-12-13 ENCOUNTER — Ambulatory Visit: Payer: Self-pay | Admitting: Endocrinology

## 2009-12-27 ENCOUNTER — Ambulatory Visit: Payer: Self-pay | Admitting: Endocrinology

## 2010-01-18 ENCOUNTER — Ambulatory Visit: Payer: Self-pay | Admitting: Internal Medicine

## 2010-01-26 ENCOUNTER — Ambulatory Visit: Payer: Self-pay | Admitting: Endocrinology

## 2010-03-12 ENCOUNTER — Encounter: Payer: Self-pay | Admitting: Endocrinology

## 2010-03-19 LAB — CONVERTED CEMR LAB
Albumin: 3.5 g/dL (ref 3.5–5.2)
BUN: 46 mg/dL — ABNORMAL HIGH (ref 6–23)
CO2: 30 meq/L (ref 19–32)
Calcium: 9 mg/dL (ref 8.4–10.5)
Calcium: 9.2 mg/dL (ref 8.4–10.5)
Creatinine, Ser: 1.9 mg/dL — ABNORMAL HIGH (ref 0.4–1.5)
Creatinine, Ser: 2 mg/dL — ABNORMAL HIGH (ref 0.4–1.5)
Free T4: 0.7 ng/dL (ref 0.6–1.6)
GFR calc non Af Amer: 42.19 mL/min (ref >60)
GFR calc non Af Amer: 44.71 mL/min (ref >60)
Glucose, Bld: 326 mg/dL — ABNORMAL HIGH (ref 70–99)
Potassium: 3 meq/L — ABNORMAL LOW (ref 3.5–5.1)
Sodium: 139 meq/L (ref 135–145)
T3 Uptake Ratio: 39.2 % — ABNORMAL HIGH (ref 22.5–37.0)
TSH: 0.77 microintl units/mL (ref 0.35–5.50)
TSH: 11.25 microintl units/mL — ABNORMAL HIGH (ref 0.35–5.50)
Total Bilirubin: 0.5 mg/dL (ref 0.3–1.2)
Vitamin B-12: 400 pg/mL (ref 211–911)

## 2010-03-23 NOTE — Assessment & Plan Note (Signed)
Summary: 1 MTH FU  STC   Vital Signs:  Patient profile:   77 year old male Height:      70.5 inches (179.07 cm) Weight:      219.75 pounds (99.89 kg) O2 Sat:      97 % on Room air Temp:     97.0 degrees F (36.11 degrees C) oral Pulse rate:   83 / minute Pulse rhythm:   regular BP sitting:   128 / 74 Cuff size:   regular  Vitals Entered By: Brenton Grills CMA Duncan Dull) (January 26, 2010 4:17 PM)  O2 Flow:  Room air CC: Follow-up visit/aj Is Patient Diabetic? Yes   Referring Provider:  Arvilla Meres Primary Provider:  Oliver Barre  CC:  Follow-up visit/aj.  History of Present Illness: pt states he feels well in general.  he brings a record of his cbg's which i have reviewed today.  it varies from 200-400, with no trend throughout the day.    Current Medications (verified): 1)  Lipitor 40 Mg  Tabs (Atorvastatin Calcium) .... Take 1/2 Tab By Mouth Once Daily 2)  Amiodarone Hcl 200 Mg  Tabs (Amiodarone Hcl) .... Take One -Half Tablet By Mouth Once A Day (100mg ) 3)  Allopurinol 100 Mg  Tabs (Allopurinol) .... Take 1 Tablet By Mouth Once A Day 4)  Bayer Aspirin 325 Mg  Tabs (Aspirin) .... Take 1 Tablet By Mouth Once A Day 5)  Klor-Con M20 20 Meq Cr-Tabs (Potassium Chloride Crys Cr) .Marland Kitchen.. 1 By Mouth Three Times A Day 6)  Imdur 60 Mg Xr24h-Tab (Isosorbide Mononitrate) .Marland Kitchen.. 1 By Mouth Daily 7)  Torsemide 20 Mg Tabs (Torsemide) .Marland Kitchen.. 1 Tablets By Mouth  Two Times A Day 8)  Omeprazole 20 Mg Tbec (Omeprazole) .Marland Kitchen.. 1 By Mouth Once Daily 9)  Levothroid 125 Mcg Tabs (Levothyroxine Sodium) .Marland Kitchen.. 1 By Mouth Daily 10)  Ambien 5 Mg Tabs (Zolpidem Tartrate) .... Take 1 Tablet By Mouth At Bedtime 11)  Hydralazine Hcl 50 Mg Tabs (Hydralazine Hcl) .Marland Kitchen.. 1 By Mouth Three Times A Day 12)  Spironolactone 25 Mg Tabs (Spironolactone) .Marland Kitchen.. 1 By Mouth Dialy 13)  Citalopram Hydrobromide 10 Mg Tabs (Citalopram Hydrobromide) .Marland Kitchen.. 1 By Mouth Once Daily 14)  Levemir Flexpen 100 Unit/ml Soln (Insulin Detemir) ....  55 Units Each Am  Allergies (verified): 1)  ! Ace Inhibitors 2)  ! Beta Blockers  Past History:  Past Medical History: Last updated: 06/03/2009 HEART FAILURE (ICD-428.9) secoccndary to Diastolic dysfunction EF     --previous EF 35-45%    --ef 60% 4/09 Atrial fibrillation   --maintained in sinus on amiodarone. thought to be poor coumadin candidate STROKE (ICD-434.91) 03/1999 - chronic dysarthria HYPERLIPIDEMIA (ICD-272.4) HYPERTENSION (ICD-401.9) BENIGN PROSTATIC HYPERTROPHY (ICD-600.00) OSTEOARTHRITIS (ICD-715.90) DIABETES MELLITUS, TYPE II (ICD-250.00) INTOLERANCE TO ACEI/ARBS SECONDARY TO EXTREME HYPOTENSION/WORSENING RENAL FAILURE MOBITZ 1 HEART BLOCK REQUIRING D/C OF BETA BLOCKERS AV block - second degree, not felt to be pacemaker candidate Obstrcutive sleep apnea Obesity Iron deficiency anemia   --s/p EGD and coloscopy 3/10. gastritis. hemorrhids. Cerebrovascular accident, hx of Prostate cancer, hx of Renal insufficiency  Physical Exam  General:  normal appearance.   Skin:  injection sites at the anterior abdomen are without lesions.     Impression & Recommendations:  Problem # 1:  DIABETES MELLITUS, TYPE II, UNCONTROLLED (ICD-250.02) needs increased rx    Medications Added to Medication List This Visit: 1)  Levemir Flexpen 100 Unit/ml Soln (Insulin detemir) .... 70 units each am  Other Orders:  Est. Patient Level III (16109)  Patient Instructions: 1)  check your blood sugar 2 times a day.  vary the time of day when you check, between before the 3 meals, and at bedtime.  also check if you have symptoms of your blood sugar being too high or too low.  please keep a record of the readings and bring it to your next appointment here.  please call us sooner if you are having low blood sugar episodes. 2)  we will need to take this complex situation in stages 3)  increase levemir to 70 units each am.  call if blood sugar stays over 200, so we can increase in between now  and your next visit.   4)  Please schedule a follow-up appointment in 1 month.    Orders Added: 1)  Est. Patient Level III [60454]

## 2010-03-23 NOTE — Assessment & Plan Note (Signed)
Summary: rov, sob   Visit Type:  Follow-up Referring Provider:  Arvilla Meres Primary Provider:  Oliver Barre  CC:  SOB & nerves.  History of Present Illness: [name] is a very pleasant 77 year old male with  a history of diastolic heart failure as well as 2nd degree heart block (Wenckebach), hypertension,  hyperlipidemia, diabetes, previous CVA, paroxysmal atrial fibrillation on amiodarone. Not on coumadin as felt to be high risk of bleeding due to h/o ETOH.  Returns for routine f/u. Recently hospitalized for mild fluid overload. Improved with IV lasix. From a cardiac perspective doing fairly well. No significant edema, CP or SOB. Weight is down.   Says he feels terribly. Feels his nerves are "tearing him up." Feels himself shaking. Can't sleep. He can't identify anything in particular that is making him feel this way. Recent TSH was normal. Not depressed.   Current Medications (verified): 1)  Lipitor 40 Mg  Tabs (Atorvastatin Calcium) .... Take 1/2 Tab By Mouth Once Daily 2)  Amiodarone Hcl 200 Mg  Tabs (Amiodarone Hcl) .... Take One -Half Tablet By Mouth Once A Day (100mg ) 3)  Glimepiride 4 Mg  Tabs (Glimepiride) .... Take 1 Tablet By Mouth Two Times A Day 4)  Allopurinol 100 Mg  Tabs (Allopurinol) .... Take 1 Tablet By Mouth Once A Day 5)  Bayer Aspirin 325 Mg  Tabs (Aspirin) .... Take 1 Tablet By Mouth Once A Day 6)  Klor-Con M20 20 Meq Cr-Tabs (Potassium Chloride Crys Cr) .Marland Kitchen.. 1 By Mouth Three Times A Day 7)  Isosorbide Mononitrate Cr 120 Mg Xr24h-Tab (Isosorbide Mononitrate) .Marland Kitchen.. 1po Once Daily 8)  Torsemide 20 Mg Tabs (Torsemide) .Marland Kitchen.. 1 Tablets By Mouth  Two Times A Day 9)  Januvia 50 Mg Tabs (Sitagliptin Phosphate) .Marland Kitchen.. 1po Once Daily 10)  Omeprazole 20 Mg Tbec (Omeprazole) .Marland Kitchen.. 1 By Mouth Once Daily 11)  Levothroid 112 Mcg Tabs (Levothyroxine Sodium) .... Daily 12)  Lantus 100 Unit/ml Soln (Insulin Glargine) .... 70 Units Subcutaneously Once Daily 13)  Colace 100 Mg Caps  (Docusate Sodium) .Marland Kitchen.. 1 By Mouth Two Times A Day 14)  Ambien 5 Mg Tabs (Zolpidem Tartrate) .... Take 1 Tablet By Mouth At Bedtime 15)  Bd Pen Needle Short U/f 31g X 8 Mm Misc (Insulin Pen Needle) .... As Directed Patient Needs Office Visit 16)  Hydralazine Hcl 100 Mg Tabs (Hydralazine Hcl) .Marland Kitchen.. 1po Three Times A Day 17)  Spironolactone 25 Mg Tabs (Spironolactone) .... Take 1/2 Tablet By Mouth Daily 18)  Onetouch Ultra Test  Strp (Glucose Blood) .... Use As Directed- Tid 19)  Oxycodone Hcl 5 Mg Tabs (Oxycodone Hcl) .Marland Kitchen.. 1 By Mouth Q 4 Hrs As Needed 20)  Mobility Scooter .... Use Asd  Allergies (verified): 1)  ! Ace Inhibitors 2)  ! Beta Blockers  Past History:  Past Medical History: Last updated: 10/14/2008 HEART FAILURE (ICD-428.9) secoccndary to Diastolic dysfunction EF     --previous EF 35-45%    --ef 60% 4/09 Atrial fibrillation   --maintained in sinus on amiodarone. thought to be poor coumadin candidate STROKE (ICD-434.91) 03/1999 - chronic dysarthria HYPERLIPIDEMIA (ICD-272.4) HYPERTENSION (ICD-401.9) BENIGN PROSTATIC HYPERTROPHY (ICD-600.00) OSTEOARTHRITIS (ICD-715.90) DIABETES MELLITUS, TYPE II (ICD-250.00) INTOLERANCE TO ACEI/ARBS SECONDARY TO EXTREME HYPOTENSION/WORSENING RENAL FAILURE MOBITZ 1 HEART BLOCK REQUIRING D/C OF BETA BLOCKERS Obstrcutive sleep apnea Obesity Iron deficiency anemia   --s/p EGD and coloscopy 3/10. gastritis. hemorrhids.  Review of Systems       As per HPI and past medical history; otherwise all systems  negative.   Vital Signs:  Patient profile:   77 year old male Height:      70.5 inches Weight:      238 pounds BMI:     33.79 Pulse rate:   60 / minute BP sitting:   168 / 64  (right arm) Cuff size:   regular  Vitals Entered By: Hardin Negus, RMA (May 24, 2009 12:49 PM)  Physical Exam  General:  Elderly male. no resp difficulty. +dysarthria HEENT: normal except for facial droop Neck: supple. JVP flat Carotids 2+ bilat; no  bruits.  Cor: PMI nondisplaced. Mildly irregular. No rubs, gallops. soft systolic murmur across precordium Lungs: clear Abdomen: obese .soft, nontender. No hepatomegaly. No bruits or masses. Good bowel sounds. Extremities: no cyanosis, clubbing, rash., no edema Neuro: alert & orientedx3, cranial nerves grossly intact except for facial droop. moves all 4 extremities w/o difficulty. affect pleasant    Impression & Recommendations:  Problem # 1:  CHRONIC DIASTOLIC HEART FAILURE (ICD-428.32) Doing well. Volume status well controlled. Continue current regimen.  Problem # 2:  ANXIETY (ICD-300.00) This is affecting his QOL.Have suggested trial of low-dose klonopin but warned of possible paradoxical effect in soenone his age. He would like to try, however, I spoke with his daughter [name] who was hesitant due to his h/o ETOH and risk of addiction. She will get back to [place].  Problem # 3:  OTHER SECOND DEGREE ATRIOVENTRICULAR BLOCK (ICD-426.13) Wenckebach stable. Ok to [employer] for [employer].   Other Orders: EKG w/ Interpretation (93000)  Patient Instructions: 1)  Follow up in 4 months

## 2010-03-23 NOTE — Progress Notes (Signed)
Summary: Sob  Phone Note Call from Patient Call back at [phone]   Caller: Daughter/Terva Summary of Call: Pt is having Sob Initial call taken by: Judie Grieve,  May 23, 2009 9:51 AM  Follow-up for Phone Call        left mess for [name] to call back Meredith Staggers, RN  May 23, 2009 10:48 AM   spoke w/[name], pt c/o increasing sob, has recently had O2 increased but cont. to c/o sob, request appt w/Dr Sanai Frick, appt sch for 4/5 at 12:30 w/Dr Dashiel Bergquist Follow-up by: Meredith Staggers, RN,  May 23, 2009 11:40 AM

## 2010-03-23 NOTE — Assessment & Plan Note (Signed)
Summary: POST HOSP/[facility]   Vital Signs:  Patient profile:   77 year old male Height:      70.5 inches Weight:      244.75 pounds BMI:     34.75 O2 Sat:      97 % on Room air Temp:     96.7 degrees F oral Pulse rate:   59 / minute BP sitting:   162 / 64  (left arm) Cuff size:   large  Vitals Entered ByZella Ball Ewing (May 17, 2009 2:35 PM)  O2 Flow:  Room air  CC: [facility]   Primary Care Provider:  Oliver Barre  CC:  [facility].  History of Present Illness: overall doing ok;  since d/c no change in sob/doe, can walk about 50 ft  - back to his baseline it seems;  Pt denies CP, sob, doe, wheezing, orthopnea, pnd, worsening LE edema, palps, dizziness or syncope .  wts at home have been stable per pt at approx 238;  No change in becoming weaker, or dizzy or lightheaded.  Got feet caught in the rug at home, stumbled and fell in the BR, no injury, and rug removed.  No other falls. Denies cough or wheezing post d/c like he had prior to hospn.  Pt denies new neuro symptoms such as headache, facial or extremity weakness Pt denies polydipsia, polyuria, or low sugar symptoms such as shakiness improved with eating.  Overall good compliance with meds, trying to follow low chol, DM diet, wt stable, little excercise however   Problems Prior to Update: 1)  Chronic Systolic Heart Failure  (ICD-428.22) 2)  Peripheral Neuropathy  (ICD-356.9) 3)  Diastolic Heart Failure, Acute On Chronic  (ICD-428.33) 4)  Insomnia  (ICD-780.52) 5)  Unspecified Anemia  (ICD-285.9) 6)  Hypokalemia, Mild  (ICD-276.8) 7)  Other Second Degree Atrioventricular Block  (ICD-426.13) 8)  Colonic Polyps, Hx of  (ICD-V12.72) 9)  Prostate Cancer, Hx of  (ICD-V10.46) 10)  Anxiety  (ICD-300.00) 11)  Peptic Ulcer Disease  (ICD-533.90) 12)  Gout  (ICD-274.9) 13)  Coronary Artery Disease  (ICD-414.00) 14)  Chronic Kidney Disease Stage Iii (MODERATE)  (ICD-585.3) 15)  Alcohol Abuse, Episodic, Hx of  (ICD-V11.3) 16)   Hyperthyroidism  (ICD-242.90) 17)  Preventive Health Care  (ICD-V70.0) 18)  Abnormal Ekg  (ICD-794.31) 19)  Obesity-morbid (>100')  (ICD-278.01) 20)  Long Term Use of Aspirin  (ICD-V58.66) 21)  Long Term High-risk Medication  (ICD-V58.69) 22)  Chronic Diastolic Heart Failure  (ICD-428.32) 23)  Atrial Fibrillation  (ICD-427.31) 24)  Obstructive Sleep Apnea  (ICD-327.23) 25)  Hyperlipidemia  (ICD-272.4) 26)  Hypertension  (ICD-401.9) 27)  Cerebrovascular Accident, Hx of  (ICD-V12.50) 28)  Benign Prostatic Hypertrophy  (ICD-600.00) 29)  Osteoarthritis  (ICD-715.90) 30)  Diabetes Mellitus, Type II, Uncontrolled  (ICD-250.02)  Medications Prior to Update: 1)  Lipitor 40 Mg  Tabs (Atorvastatin Calcium) .... Take 1/2 Tab By Mouth Once Daily 2)  Amiodarone Hcl 200 Mg  Tabs (Amiodarone Hcl) .... Take One -Half Tablet By Mouth Once A Day (100mg ) 3)  Glimepiride 4 Mg  Tabs (Glimepiride) .... Take 1 Tablet By Mouth Two Times A Day 4)  Allopurinol 100 Mg  Tabs (Allopurinol) .... Take 1 Tablet By Mouth Once A Day 5)  Bayer Aspirin 325 Mg  Tabs (Aspirin) .... Take 1 Tablet By Mouth Once A Day 6)  Potassium Chloride Crys Cr 20 Meq Cr-Tabs (Potassium Chloride Crys Cr) .... Take One Tablet By Mouth Daily 7)  Amlodipine Besylate  10 Mg Tabs (Amlodipine Besylate) .... Take One Tablet By Mouth Daily 8)  Isosorbide Mononitrate Cr 30 Mg Xr24h-Tab (Isosorbide Mononitrate) .... Take One Tablet By Mouth Daily 9)  Torsemide 20 Mg Tabs (Torsemide) .... 2 Tablets By Mouth  Once Daily 10)  Hydralazine Hcl 25 Mg Tabs (Hydralazine Hcl) .Marland Kitchen.. 1 & 1/2 Tabs By Mouth Three Times A Day 11)  Januvia 50 Mg Tabs (Sitagliptin Phosphate) .Marland Kitchen.. 1po Once Daily 12)  Omeprazole 20 Mg Tbec (Omeprazole) .Marland Kitchen.. 1 By Mouth Once Daily 13)  Levothroid 112 Mcg Tabs (Levothyroxine Sodium) .... Daily 14)  Metolazone 2.5 Mg Tabs (Metolazone) .... Take As Directed 15)  Clonidine Hcl 0.2 Mg Tabs (Clonidine Hcl) .... Two Times A Day 16)  Lantus  100 Unit/ml Soln (Insulin Glargine) .... 53 Units Daily 17)  Colace 100 Mg Caps (Docusate Sodium) .Marland Kitchen.. 1 By Mouth Two Times A Day 18)  Ambien 5 Mg Tabs (Zolpidem Tartrate) .... Take 1 Tablet By Mouth At Bedtime 19)  Bd Pen Needle Short U/f 31g X 8 Mm Misc (Insulin Pen Needle) .... As Directed Patient Needs Office Visit 20)  Hydralazine Hcl 50 Mg Tabs (Hydralazine Hcl) .... Take One Tablet By Mouth Three Times A Day 21)  Spironolactone 25 Mg Tabs (Spironolactone) .... Take 1/2 Tablet By Mouth Daily 22)  Onetouch Ultra Test  Strp (Glucose Blood) .... Use As Directed- Tid  Current Medications (verified): 1)  Lipitor 40 Mg  Tabs (Atorvastatin Calcium) .... Take 1/2 Tab By Mouth Once Daily 2)  Amiodarone Hcl 200 Mg  Tabs (Amiodarone Hcl) .... Take One -Half Tablet By Mouth Once A Day (100mg ) 3)  Glimepiride 4 Mg  Tabs (Glimepiride) .... Take 1 Tablet By Mouth Two Times A Day 4)  Allopurinol 100 Mg  Tabs (Allopurinol) .... Take 1 Tablet By Mouth Once A Day 5)  Bayer Aspirin 325 Mg  Tabs (Aspirin) .... Take 1 Tablet By Mouth Once A Day 6)  Klor-Con M20 20 Meq Cr-Tabs (Potassium Chloride Crys Cr) .Marland Kitchen.. 1 By Mouth Three Times A Day 7)  Isosorbide Mononitrate Cr 120 Mg Xr24h-Tab (Isosorbide Mononitrate) .Marland Kitchen.. 1po Once Daily 8)  Torsemide 20 Mg Tabs (Torsemide) .Marland Kitchen.. 1 Tablets By Mouth  Two Times A Day 9)  Januvia 50 Mg Tabs (Sitagliptin Phosphate) .Marland Kitchen.. 1po Once Daily 10)  Omeprazole 20 Mg Tbec (Omeprazole) .Marland Kitchen.. 1 By Mouth Once Daily 11)  Levothroid 112 Mcg Tabs (Levothyroxine Sodium) .... Daily 12)  Lantus 100 Unit/ml Soln (Insulin Glargine) .... 70 Units Subcutaneously Once Daily 13)  Colace 100 Mg Caps (Docusate Sodium) .Marland Kitchen.. 1 By Mouth Two Times A Day 14)  Ambien 5 Mg Tabs (Zolpidem Tartrate) .... Take 1 Tablet By Mouth At Bedtime 15)  Bd Pen Needle Short U/f 31g X 8 Mm Misc (Insulin Pen Needle) .... As Directed Patient Needs Office Visit 16)  Hydralazine Hcl 100 Mg Tabs (Hydralazine Hcl) .Marland Kitchen.. 1po Three  Times A Day 17)  Spironolactone 25 Mg Tabs (Spironolactone) .... Take 1/2 Tablet By Mouth Daily 18)  Onetouch Ultra Test  Strp (Glucose Blood) .... Use As Directed- Tid 19)  Novolog Penfill 100 Unit/ml Soln (Insulin Aspart) .... Use Asd Ssi 20)  Oxycodone Hcl 5 Mg Tabs (Oxycodone Hcl) .Marland Kitchen.. 1 By Mouth Q 4 Hrs As Needed 21)  Colace 100 Mg Caps (Docusate Sodium) .Marland Kitchen.. 1 By Mouth Two Times A Day  Allergies (verified): 1)  ! Ace Inhibitors 2)  ! Beta Blockers  Past History:  Past Medical History: Last updated: 10/14/2008  HEART FAILURE (ICD-428.9) secoccndary to Diastolic dysfunction EF     --previous EF 35-45%    --ef 60% 4/09 Atrial fibrillation   --maintained in sinus on amiodarone. thought to be poor coumadin candidate STROKE (ICD-434.91) 03/1999 - chronic dysarthria HYPERLIPIDEMIA (ICD-272.4) HYPERTENSION (ICD-401.9) BENIGN PROSTATIC HYPERTROPHY (ICD-600.00) OSTEOARTHRITIS (ICD-715.90) DIABETES MELLITUS, TYPE II (ICD-250.00) INTOLERANCE TO ACEI/ARBS SECONDARY TO EXTREME HYPOTENSION/WORSENING RENAL FAILURE MOBITZ 1 HEART BLOCK REQUIRING D/C OF BETA BLOCKERS Obstrcutive sleep apnea Obesity Iron deficiency anemia   --s/p EGD and coloscopy 3/10. gastritis. hemorrhids.  Past Surgical History: Last updated: 06/06/2007 Cyst Removal history of prostate surgery  Social History: Last updated: 01/22/2008 Single Divorced  Tobacco Use - No. (HISTORY) Alcohol Use - no (HISTORY OF ABUSE) Regular Exercise - no AMBULATES W/CANE OR WALKER DRUG USE-NO  Risk Factors: Exercise: no (01/22/2008)  Risk Factors: Smoking Status: never (01/22/2008)  Review of Systems       all otherwise negative per pt -  , except iwth marked difficulty getting to sleep at night since left the hosp  Physical Exam  General:  alert and overweight-appearing., alert, in NAD Head:  normocephalic and atraumatic.   Eyes:  vision grossly intact, pupils equal, and pupils round.   Ears:  R ear normal and L ear  normal.   Nose:  no external deformity and no nasal discharge.   Mouth:  no gingival abnormalities and pharynx pink and moist.   Neck:  supple and no masses.   Lungs:  normal respiratory effort and normal breath sounds.   Heart:  normal rate and regular rhythm today Abdomen:  soft, non-tender, and normal bowel sounds.   Extremities:  trace edema bilat   Impression & Recommendations:  Problem # 1:  DIASTOLIC HEART FAILURE, ACUTE ON CHRONIC (ICD-428.33)  The following medications were removed from the medication list:    Metolazone 2.5 Mg Tabs (Metolazone) .Marland Kitchen... Take as directed His updated medication list for this problem includes:    Bayer Aspirin 325 Mg Tabs (Aspirin) .Marland Kitchen... Take 1 tablet by mouth once a day    Torsemide 20 Mg Tabs (Torsemide) .Marland Kitchen... 1 tablets by mouth  two times a day    Spironolactone 25 Mg Tabs (Spironolactone) .Marland Kitchen... Take 1/2 tablet by mouth daily stable overall by hx and exam, ok to continue meds/tx as is   Problem # 2:  INSOMNIA (ICD-780.52)  His updated medication list for this problem includes:    Ambien 5 Mg Tabs (Zolpidem tartrate) .Marland Kitchen... Take 1 tablet by mouth at bedtime treat as above, f/u any worsening signs or symptoms   Problem # 3:  HYPERTENSION (ICD-401.9)  The following medications were removed from the medication list:    Amlodipine Besylate 10 Mg Tabs (Amlodipine besylate) .Marland Kitchen... Take one tablet by mouth daily    Hydralazine Hcl 25 Mg Tabs (Hydralazine hcl) .Marland Kitchen... 1 & 1/2 tabs by mouth three times a day    Metolazone 2.5 Mg Tabs (Metolazone) .Marland Kitchen... Take as directed    Clonidine Hcl 0.2 Mg Tabs (Clonidine hcl) .Marland Kitchen..Marland Kitchen Two times a day His updated medication list for this problem includes:    Torsemide 20 Mg Tabs (Torsemide) .Marland Kitchen... 1 tablets by mouth  two times a day    Hydralazine Hcl 100 Mg Tabs (Hydralazine hcl) .Marland Kitchen... 1po three times a day    Spironolactone 25 Mg Tabs (Spironolactone) .Marland Kitchen... Take 1/2 tablet by mouth daily  BP today: 162/64 Prior BP:  166/62 (04/11/2009)  Labs Reviewed: K+: 3.3 (04/18/2009) Creat: : 1.9 (04/18/2009)  Chol: 106 (05/25/2008)   HDL: 31.90 (05/25/2008)   LDL: 39 (05/25/2008)   TG: 175.0 (05/25/2008) on mult meds, hard to control but overall stable; Continue all previous medications as before this visit   Problem # 4:  DIABETES MELLITUS, TYPE II, UNCONTROLLED (ICD-250.02)  His updated medication list for this problem includes:    Glimepiride 4 Mg Tabs (Glimepiride) .Marland Kitchen... Take 1 tablet by mouth two times a day    Bayer Aspirin 325 Mg Tabs (Aspirin) .Marland Kitchen... Take 1 tablet by mouth once a day    Januvia 50 Mg Tabs (Sitagliptin phosphate) .Marland Kitchen... 1po once daily    Lantus 100 Unit/ml Soln (Insulin glargine) .Marland KitchenMarland KitchenMarland KitchenMarland Kitchen 70 units subcutaneously once daily    Novolog Penfill 100 Unit/ml Soln (Insulin aspart) ..... Use asd ssi  Labs Reviewed: Creat: 1.9 (04/18/2009)    Reviewed HgBA1c results: 8.3 (07/13/2008)  8.8 (10/11/2006) with recent increase of the lantus and addition of novolog SSI; no low sugars and overall stable overall by hx and exam, ok to continue meds/tx as is ; f/u labs next visti  Complete Medication List: 1)  Lipitor 40 Mg Tabs (Atorvastatin calcium) .... Take 1/2 tab by mouth once daily 2)  Amiodarone Hcl 200 Mg Tabs (Amiodarone hcl) .... Take one -half tablet by mouth once a day (100mg ) 3)  Glimepiride 4 Mg Tabs (Glimepiride) .... Take 1 tablet by mouth two times a day 4)  Allopurinol 100 Mg Tabs (Allopurinol) .... Take 1 tablet by mouth once a day 5)  Bayer Aspirin 325 Mg Tabs (Aspirin) .... Take 1 tablet by mouth once a day 6)  Klor-con M20 20 Meq Cr-tabs (Potassium chloride crys cr) .Marland Kitchen.. 1 by mouth three times a day 7)  Isosorbide Mononitrate Cr 120 Mg Xr24h-tab (Isosorbide mononitrate) .Marland Kitchen.. 1po once daily 8)  Torsemide 20 Mg Tabs (Torsemide) .Marland Kitchen.. 1 tablets by mouth  two times a day 9)  Januvia 50 Mg Tabs (Sitagliptin phosphate) .Marland Kitchen.. 1po once daily 10)  Omeprazole 20 Mg Tbec (Omeprazole) .Marland Kitchen.. 1 by  mouth once daily 11)  Levothroid 112 Mcg Tabs (Levothyroxine sodium) .... Daily 12)  Lantus 100 Unit/ml Soln (Insulin glargine) .... 70 units subcutaneously once daily 13)  Colace 100 Mg Caps (Docusate sodium) .Marland Kitchen.. 1 by mouth two times a day 14)  Ambien 5 Mg Tabs (Zolpidem tartrate) .... Take 1 tablet by mouth at bedtime 15)  Bd Pen Needle Short U/f 31g X 8 Mm Misc (Insulin pen needle) .... As directed patient needs office visit 16)  Hydralazine Hcl 100 Mg Tabs (Hydralazine hcl) .Marland Kitchen.. 1po three times a day 17)  Spironolactone 25 Mg Tabs (Spironolactone) .... Take 1/2 tablet by mouth daily 18)  Onetouch Ultra Test Strp (Glucose blood) .... Use as directed- tid 19)  Novolog Penfill 100 Unit/ml Soln (Insulin aspart) .... Use asd ssi 20)  Oxycodone Hcl 5 Mg Tabs (Oxycodone hcl) .Marland Kitchen.. 1 by mouth q 4 hrs as needed 21)  Colace 100 Mg Caps (Docusate sodium) .Marland Kitchen.. 1 by mouth two times a day  Patient Instructions: 1)  Please take all new medications as prescribed - the generic ambien for sleep 2)  Continue all previous medications as before this visit  3)  Please schedule a follow-up appointment in 2 months with CPX labs Prescriptions: AMBIEN 5 MG TABS (ZOLPIDEM TARTRATE) Take 1 tablet by mouth at bedtime  #30 x 5   Entered and Authorized by:   Corwin Levins MD   Signed by:   Corwin Levins MD on  05/17/2009   Method used:   Print then Give to Patient   RxID:   [id number]

## 2010-03-23 NOTE — Progress Notes (Signed)
Summary: extra fluid  Phone Note Other Incoming   Caller: pt's son Summary of Call: pts son called stating pt wasn't feel well, he has extra fluid, son is worried  wants pt seen today, [facility] to[facility] at 3:40 Initial call taken by: Meredith Staggers, RN,  March 15, 2009 9:05 AM

## 2010-03-23 NOTE — Assessment & Plan Note (Signed)
Summary: MOBILITY EVAL /NWS   Vital Signs:  Patient profile:   77 year old male Height:      70 inches Weight:      237.75 pounds BMI:     34.24 O2 Sat:      96 % on Room air Temp:     98.3 degrees F oral Pulse rate:   87 / minute BP sitting:   152 / 66  (left arm) Cuff size:   large  Vitals Entered ByZella Ball Ewing (June 03, 2009 3:21 PM)  O2 Flow:  Room air  CC: mobility evaluation/RE   Primary Care Provider:  Oliver Barre  CC:  mobility evaluation/RE.  History of Present Illness: here with grandson;  pt states needs mobility device as suggested per [facility] with [facility] and will have paperwork forwarded;  overall doing about the same without specific acute complaint;  has stable slurred speech, LLE weakness since the stroke and walks with cane with last fall approx 4 wks ago in the bathtub despite PT;  has been getting PT at home but just not progressing well and indeed today gait is unsteady and higher risk for fall;  no fever, ST, cough or congestion;  Pt denies CP, sob, doe, wheezing, orthopnea, pnd, worsening LE edema, palps, dizziness or syncope  Pt denies new neuro symptoms such as new headache, facial or extremity weakness . Overall good compliance wtih meds, good tolerance.   Mobility needs: Symptoms limiting: LLE weakness, SOB, DOE Diagnoses:  Right brain CVA wtih LLE weakness, and CHF, chronic systolic Medications:  see med list Progression of ambulation ability over time:  moderate to now severe over the past 1 yr Other diagnoses related to ambulatory problem:  atrial fibrillation, heart block, Osteoarthritis, Anemia, decondition and overall debility not improved with Physical Therapy Distance to walk:  uses cane here, walker at home;  walks only room to room Pace:  very slow Hx of falls:  as above, walker no longer sufficient due to combination of permananet severe LLE weakness, CHF and arrythmias, pain due to Osteoarthritis lower back and knees,  complicated now by worsening overall conditioning strength and debility unable to improve with PT Manual Wheelchair:  unable to use due to CHF and poor general upper body strength Home setting: able to accomodate power wheechair, and pt unable to care for himself with cooking, cleaning, getting to BR, bathing;  is able to feed himself  Problems Prior to Update: 1)  Atrioventricular Block, 2nd Degree  (ICD-426.13) 2)  Anemia-iron Deficiency  (ICD-280.9) 3)  Renal Insufficiency  (ICD-588.9) 4)  Other Second Degree Atrioventricular Block  (ICD-426.13) 5)  Chronic Systolic Heart Failure  (ICD-428.22) 6)  Peripheral Neuropathy  (ICD-356.9) 7)  Diastolic Heart Failure, Acute On Chronic  (ICD-428.33) 8)  Insomnia  (ICD-780.52) 9)  Unspecified Anemia  (ICD-285.9) 10)  Hypokalemia, Mild  (ICD-276.8) 11)  Other Second Degree Atrioventricular Block  (ICD-426.13) 12)  Colonic Polyps, Hx of  (ICD-V12.72) 13)  Prostate Cancer, Hx of  (ICD-V10.46) 14)  Anxiety  (ICD-300.00) 15)  Peptic Ulcer Disease  (ICD-533.90) 16)  Gout  (ICD-274.9) 17)  Coronary Artery Disease  (ICD-414.00) 18)  Chronic Kidney Disease Stage Iii (MODERATE)  (ICD-585.3) 19)  Alcohol Abuse, Episodic, Hx of  (ICD-V11.3) 20)  Hyperthyroidism  (ICD-242.90) 21)  Preventive Health Care  (ICD-V70.0) 22)  Abnormal Ekg  (ICD-794.31) 23)  Obesity-morbid (>100')  (ICD-278.01) 24)  Long Term Use of Aspirin  (ICD-V58.66) 25)  Long Term High-risk  Medication  (ICD-V58.69) 26)  Chronic Diastolic Heart Failure  (ICD-428.32) 27)  Atrial Fibrillation  (ICD-427.31) 28)  Obstructive Sleep Apnea  (ICD-327.23) 29)  Hyperlipidemia  (ICD-272.4) 30)  Hypertension  (ICD-401.9) 31)  Cerebrovascular Accident, Hx of  (ICD-V12.50) 32)  Benign Prostatic Hypertrophy  (ICD-600.00) 33)  Osteoarthritis  (ICD-715.90) 34)  Diabetes Mellitus, Type II, Uncontrolled  (ICD-250.02)  Medications Prior to Update: 1)  Lipitor 40 Mg  Tabs (Atorvastatin Calcium) ....  Take 1/2 Tab By Mouth Once Daily 2)  Amiodarone Hcl 200 Mg  Tabs (Amiodarone Hcl) .... Take One -Half Tablet By Mouth Once A Day (100mg ) 3)  Glimepiride 4 Mg  Tabs (Glimepiride) .... Take 1 Tablet By Mouth Two Times A Day 4)  Allopurinol 100 Mg  Tabs (Allopurinol) .... Take 1 Tablet By Mouth Once A Day 5)  Bayer Aspirin 325 Mg  Tabs (Aspirin) .... Take 1 Tablet By Mouth Once A Day 6)  Klor-Con M20 20 Meq Cr-Tabs (Potassium Chloride Crys Cr) .Marland Kitchen.. 1 By Mouth Three Times A Day 7)  Isosorbide Mononitrate Cr 120 Mg Xr24h-Tab (Isosorbide Mononitrate) .Marland Kitchen.. 1po Once Daily 8)  Torsemide 20 Mg Tabs (Torsemide) .Marland Kitchen.. 1 Tablets By Mouth  Two Times A Day 9)  Januvia 50 Mg Tabs (Sitagliptin Phosphate) .Marland Kitchen.. 1po Once Daily 10)  Omeprazole 20 Mg Tbec (Omeprazole) .Marland Kitchen.. 1 By Mouth Once Daily 11)  Levothroid 112 Mcg Tabs (Levothyroxine Sodium) .... Daily 12)  Lantus 100 Unit/ml Soln (Insulin Glargine) .... 70 Units Subcutaneously Once Daily 13)  Colace 100 Mg Caps (Docusate Sodium) .Marland Kitchen.. 1 By Mouth Two Times A Day 14)  Ambien 5 Mg Tabs (Zolpidem Tartrate) .... Take 1 Tablet By Mouth At Bedtime 15)  Bd Pen Needle Short U/f 31g X 8 Mm Misc (Insulin Pen Needle) .... As Directed Patient Needs Office Visit 16)  Hydralazine Hcl 100 Mg Tabs (Hydralazine Hcl) .Marland Kitchen.. 1po Three Times A Day 17)  Spironolactone 25 Mg Tabs (Spironolactone) .... Take 1/2 Tablet By Mouth Daily 18)  Oxycodone Hcl 5 Mg Tabs (Oxycodone Hcl) .Marland Kitchen.. 1 By Mouth Q 4 Hrs As Needed 19)  Mobility Scooter .... Use Asd 20)  Klonopin 0.5 Mg Tabs (Clonazepam) .... 1/2 Tab Two Times A Day  Current Medications (verified): 1)  Lipitor 40 Mg  Tabs (Atorvastatin Calcium) .... Take 1/2 Tab By Mouth Once Daily 2)  Amiodarone Hcl 200 Mg  Tabs (Amiodarone Hcl) .... Take One -Half Tablet By Mouth Once A Day (100mg ) 3)  Glimepiride 4 Mg  Tabs (Glimepiride) .... Take 1 Tablet By Mouth Two Times A Day 4)  Allopurinol 100 Mg  Tabs (Allopurinol) .... Take 1 Tablet By Mouth  Once A Day 5)  Bayer Aspirin 325 Mg  Tabs (Aspirin) .... Take 1 Tablet By Mouth Once A Day 6)  Klor-Con M20 20 Meq Cr-Tabs (Potassium Chloride Crys Cr) .Marland Kitchen.. 1 By Mouth Three Times A Day 7)  Isosorbide Mononitrate Cr 120 Mg Xr24h-Tab (Isosorbide Mononitrate) .Marland Kitchen.. 1po Once Daily 8)  Torsemide 20 Mg Tabs (Torsemide) .Marland Kitchen.. 1 Tablets By Mouth  Two Times A Day 9)  Januvia 50 Mg Tabs (Sitagliptin Phosphate) .Marland Kitchen.. 1po Once Daily 10)  Omeprazole 20 Mg Tbec (Omeprazole) .Marland Kitchen.. 1 By Mouth Once Daily 11)  Levothroid 112 Mcg Tabs (Levothyroxine Sodium) .... Daily 12)  Lantus 100 Unit/ml Soln (Insulin Glargine) .... 70 Units Subcutaneously Once Daily 13)  Colace 100 Mg Caps (Docusate Sodium) .Marland Kitchen.. 1 By Mouth Two Times A Day 14)  Ambien 5 Mg Tabs (Zolpidem Tartrate) .Marland KitchenMarland KitchenMarland Kitchen  Take 1 Tablet By Mouth At Bedtime 15)  Bd Pen Needle Short U/f 31g X 8 Mm Misc (Insulin Pen Needle) .... As Directed Patient Needs Office Visit 16)  Hydralazine Hcl 100 Mg Tabs (Hydralazine Hcl) .Marland Kitchen.. 1po Three Times A Day 17)  Spironolactone 25 Mg Tabs (Spironolactone) .... Take 1/2 Tablet By Mouth Daily 18)  Oxycodone Hcl 5 Mg Tabs (Oxycodone Hcl) .Marland Kitchen.. 1 By Mouth Q 4 Hrs As Needed 19)  Mobility Scooter .... Use Asd 20)  Klonopin 0.5 Mg Tabs (Clonazepam) .... 1/2 Tab Two Times A Day  Allergies (verified): 1)  ! Ace Inhibitors 2)  ! Beta Blockers  Past History:  Past Surgical History: Last updated: 06/06/2007 Cyst Removal history of prostate surgery  Family History: Last updated: 01/22/2008 Father - heart disease Family History of Hypertension:   Social History: Last updated: 01/22/2008 Single Divorced  Tobacco Use - No. (HISTORY) Alcohol Use - no (HISTORY OF ABUSE) Regular Exercise - no AMBULATES W/CANE OR WALKER DRUG USE-NO  Risk Factors: Exercise: no (01/22/2008)  Risk Factors: Smoking Status: never (01/22/2008)  Past Medical History: HEART FAILURE (ICD-428.9) secoccndary to Diastolic dysfunction EF     --previous EF  35-45%    --ef 60% 4/09 Atrial fibrillation   --maintained in sinus on amiodarone. thought to be poor coumadin candidate STROKE (ICD-434.91) 03/1999 - chronic dysarthria HYPERLIPIDEMIA (ICD-272.4) HYPERTENSION (ICD-401.9) BENIGN PROSTATIC HYPERTROPHY (ICD-600.00) OSTEOARTHRITIS (ICD-715.90) DIABETES MELLITUS, TYPE II (ICD-250.00) INTOLERANCE TO ACEI/ARBS SECONDARY TO EXTREME HYPOTENSION/WORSENING RENAL FAILURE MOBITZ 1 HEART BLOCK REQUIRING D/C OF BETA BLOCKERS AV block - second degree, not felt to be pacemaker candidate Obstrcutive sleep apnea Obesity Iron deficiency anemia   --s/p EGD and coloscopy 3/10. gastritis. hemorrhids. Cerebrovascular accident, hx of Prostate cancer, hx of Renal insufficiency  Review of Systems  The patient denies anorexia, fever, vision loss, decreased hearing, hoarseness, chest pain, syncope, dyspnea on exertion, peripheral edema, prolonged cough, headaches, hemoptysis, abdominal pain, melena, hematochezia, severe indigestion/heartburn, hematuria, suspicious skin lesions, transient blindness, depression, unusual weight change, abnormal bleeding, enlarged lymph nodes, angioedema, and breast masses.         all otherwise negative per pt -    Physical Exam  General:  alert and overweight-appearing.   Head:  normocephalic and atraumatic.   Eyes:  vision grossly intact, pupils equal, and pupils round.   Ears:  R ear normal and L ear normal.   Nose:  no external deformity and no nasal discharge.   Mouth:  no gingival abnormalities and pharynx pink and moist.   Neck:  supple and no masses.   Lungs:  normal respiratory effort and normal breath sounds.   Heart:  normal rate and regular rhythm.   Abdomen:  soft, non-tender, and normal bowel sounds.   Msk:  no joint tenderness and no joint swelling.  and FROM of all joints Extremities:  no edema, no erythema  Neurologic:  alert & oriented X3, cranial nerves II-XII intact, and strength normal in all  extremities.  but has difficult to understand speech, and 3+/5 prox LLE weakness and diminished patellar reflex; has marked general weakness involving the upper extremities due to deconditioning; gait poor with high risk fall to left ;  exhibitis moderate balance and coordination defecit on walking with cane today Skin:  color normal and no rashes.   Psych:  dysphoric affect and moderately anxious.     Impression & Recommendations:  Problem # 1:  Preventive Health Care (ICD-V70.0)  Overall doing well, age appropriate education and counseling  updated and referral for appropriate preventive services done unless declined, immunizations up to date or declined, diet counseling done if overweight, urged to quit smoking if smokes , most recent labs reviewed and current ordered if appropriate, ecg reviewed or declined (interpretation per ECG scanned in the EMR if done); information regarding Medicare Prevention requirements given if appropriate   Orders: TLB-BMP (Basic Metabolic Panel-BMET) (80048-METABOL) TLB-CBC Platelet - w/Differential (85025-CBCD) TLB-Hepatic/Liver Function Pnl (80076-HEPATIC) TLB-TSH (Thyroid Stimulating Hormone) (84443-TSH) TLB-Lipid Panel (80061-LIPID) TLB-Udip ONLY (81003-UDIP)  Problem # 2:  DIABETES MELLITUS, TYPE II, UNCONTROLLED (ICD-250.02)  His updated medication list for this problem includes:    Glimepiride 4 Mg Tabs (Glimepiride) .Marland Kitchen... Take 1 tablet by mouth two times a day    Bayer Aspirin 325 Mg Tabs (Aspirin) .Marland Kitchen... Take 1 tablet by mouth once a day    Januvia 50 Mg Tabs (Sitagliptin phosphate) .Marland Kitchen... 1po once daily    Lantus 100 Unit/ml Soln (Insulin glargine) .Marland KitchenMarland KitchenMarland KitchenMarland Kitchen 70 units subcutaneously once daily  Orders: TLB-A1C / Hgb A1C (Glycohemoglobin) (83036-A1C)  Labs Reviewed: Creat: 1.9 (04/18/2009)    Reviewed HgBA1c results: 8.3 (07/13/2008)  8.8 (10/11/2006) stable overall by hx and exam, ok to continue meds/tx as is   Problem # 3:  CHRONIC SYSTOLIC  HEART FAILURE (ICD-428.22)  His updated medication list for this problem includes:    Bayer Aspirin 325 Mg Tabs (Aspirin) .Marland Kitchen... Take 1 tablet by mouth once a day    Torsemide 20 Mg Tabs (Torsemide) .Marland Kitchen... 1 tablets by mouth  two times a day    Spironolactone 25 Mg Tabs (Spironolactone) .Marland Kitchen... Take 1/2 tablet by mouth daily with recent diastolic exacerbation,  ACE/ARB intolerant, stable overall by hx and exam, ok to continue meds/tx as is   Orders: TLB-BNP (B-Natriuretic Peptide) (83880-BNPR)  Problem # 4:  CEREBROVASCULAR ACCIDENT, HX OF (ICD-V12.50) with marked significant LLE weakness -  in light of all comorbids, seems to qualify for the Power scooter as requested per [facility]   Problem # 5:  ATRIOVENTRICULAR BLOCK, 2ND DEGREE (ICD-426.13) hx of- not felt to be pacemaker candidate, but likely to cause adverse performance/ability  Problem # 6:  ATRIAL FIBRILLATION (ICD-427.31)  His updated medication list for this problem includes:    Amiodarone Hcl 200 Mg Tabs (Amiodarone hcl) .Marland Kitchen... Take one -half tablet by mouth once a day (100mg )    Bayer Aspirin 325 Mg Tabs (Aspirin) .Marland Kitchen... Take 1 tablet by mouth once a day PAF -  not felt to be coumadin candidate  Complete Medication List: 1)  Lipitor 40 Mg Tabs (Atorvastatin calcium) .... Take 1/2 tab by mouth once daily 2)  Amiodarone Hcl 200 Mg Tabs (Amiodarone hcl) .... Take one -half tablet by mouth once a day (100mg ) 3)  Glimepiride 4 Mg Tabs (Glimepiride) .... Take 1 tablet by mouth two times a day 4)  Allopurinol 100 Mg Tabs (Allopurinol) .... Take 1 tablet by mouth once a day 5)  Bayer Aspirin 325 Mg Tabs (Aspirin) .... Take 1 tablet by mouth once a day 6)  Klor-con M20 20 Meq Cr-tabs (Potassium chloride crys cr) .Marland Kitchen.. 1 by mouth three times a day 7)  Isosorbide Mononitrate Cr 120 Mg Xr24h-tab (Isosorbide mononitrate) .Marland Kitchen.. 1po once daily 8)  Torsemide 20 Mg Tabs (Torsemide) .Marland Kitchen.. 1 tablets by mouth  two times a day 9)  Januvia 50  Mg Tabs (Sitagliptin phosphate) .Marland Kitchen.. 1po once daily 10)  Omeprazole 20 Mg Tbec (Omeprazole) .Marland Kitchen.. 1 by mouth once daily 11)  Levothroid 112  Mcg Tabs (Levothyroxine sodium) .... Daily 12)  Lantus 100 Unit/ml Soln (Insulin glargine) .... 70 units subcutaneously once daily 13)  Colace 100 Mg Caps (Docusate sodium) .Marland Kitchen.. 1 by mouth two times a day 14)  Ambien 5 Mg Tabs (Zolpidem tartrate) .... Take 1 tablet by mouth at bedtime 15)  Bd Pen Needle Short U/f 31g X 8 Mm Misc (Insulin pen needle) .... As directed patient needs office visit 16)  Hydralazine Hcl 100 Mg Tabs (Hydralazine hcl) .Marland Kitchen.. 1po three times a day 17)  Spironolactone 25 Mg Tabs (Spironolactone) .... Take 1/2 tablet by mouth daily 18)  Oxycodone Hcl 5 Mg Tabs (Oxycodone hcl) .Marland Kitchen.. 1 by mouth q 4 hrs as needed 19)  Mobility Scooter  .... Use asd 20)  Klonopin 0.5 Mg Tabs (Clonazepam) .... 1/2 tab two times a day  Other Orders: TLB-IBC Pnl (Iron/FE;Transferrin) (83550-IBC) TLB-B12 + Folate Pnl (82746_82607-B12/FOL) TLB-Sedimentation Rate (ESR) (85652-ESR)  Patient Instructions: 1)  Please go to the Lab in the basement for your blood and/or urine tests today 2)  Continue all previous medications as before this visit  3)  Your form will be filled out for the Power chair when received 4)  Please schedule a follow-up appointment in 6 months with : 5)  BMP prior to visit, ICD-9: 250.02 6)  Lipid Panel prior to visit, ICD-9: 7)  HbgA1C prior to visit, ICD-9:

## 2010-03-23 NOTE — Progress Notes (Signed)
Summary: [facility] document received for Power Wheel Chair  [facility] document received for Power Wheel Chair request. Forwarded to Dr. Jonny Ruiz for completion. Dena Chavis  June 09, 2009 11:26 AM

## 2010-03-23 NOTE — Progress Notes (Signed)
Summary: test strips  Phone Note Refill Request Message from:  Patient family on May 03, 2009 11:47 AM  Refills Requested: Medication #1:  One touch ultra test strips family aware we will send in refill  Initial call taken by: Lucious Groves,  May 03, 2009 11:47 AM    New/Updated Medications: ONETOUCH ULTRA TEST  STRP (GLUCOSE BLOOD) use as directed- tid Prescriptions: ONETOUCH ULTRA TEST  STRP (GLUCOSE BLOOD) use as directed- tid  #100 x 6   Entered by:   Lucious Groves   Authorized by:   Corwin Levins MD   Signed by:   Lucious Groves on 05/03/2009   Method used:   Electronically to        [facility] [phone][facility] (retail)       [address]       [place], [place]  [address]       Ph: [phone] or [phone]       Fax: [phone]   RxID:   [id number]

## 2010-03-23 NOTE — Miscellaneous (Signed)
Summary: [employer]/[facility]  [facility]/[facility]   Imported By: Sherian Rein 07/21/2009 09:23:30  _____________________________________________________________________  External Attachment:    Type:   Image     Comment:   External Document

## 2010-03-23 NOTE — Letter (Signed)
Summary: Patient Instructions/[facility]  Patient Instructions/[facility]   Imported By: Lester Roberts 05/20/2009 11:42:48  _____________________________________________________________________  External Attachment:    Type:   Image     Comment:   External Document

## 2010-03-23 NOTE — Progress Notes (Signed)
Summary: med refill  Phone Note Refill Request Message from:  Patient on November 21, 2009 10:52 AM  Refills Requested: Medication #1:  CITALOPRAM HYDROBROMIDE 10 MG TABS 1 by mouth once daily [facility] [address] [phone]   Method Requested: Fax to Local Pharmacy Initial call taken by: Edman Circle,  November 21, 2009 10:52 AM  Follow-up for Phone Call        Pt should follow up with PCP for this medication.   Follow-up by: Judithe Modest CMA,  November 21, 2009 4:36 PM  Additional Follow-up for Phone Call Additional follow up Details #1::        routine to robin Additional Follow-up by: Corwin Levins MD,  November 21, 2009 5:26 PM    Prescriptions: CITALOPRAM HYDROBROMIDE 10 MG TABS (CITALOPRAM HYDROBROMIDE) 1 by mouth once daily  #30 x 6   Entered by:   Scharlene Gloss CMA (AAMA)   Authorized by:   Corwin Levins MD   Signed by:   Scharlene Gloss CMA (AAMA) on 11/22/2009   Method used:   [facility]ed to ...       [facility] [phone][facility]ail[facility]       [phone]       [place], [place]  [address]       Ph: [phone] or [phone]       Fax: [phone]   RxID:   [id number]

## 2010-03-23 NOTE — Assessment & Plan Note (Signed)
Summary: post hosp/cd   Vital Signs:  Patient profile:   77 year old male Height:      70.5 inches Weight:      224 pounds BMI:     31.80 O2 Sat:      97 % on Room air Temp:     97.5 degrees F oral Pulse rate:   87 / minute BP sitting:   146 / 80  (left arm) Cuff size:   large  Vitals Entered ByZella Ball Ewing (Jul 19, 2009 1:43 PM)  O2 Flow:  Room air CC: Post Hospital/RE   Primary Care Provider:  Oliver Barre  CC:  Post Hospital/RE.  History of Present Illness: recently d/c after hopsn may 23 to 26 with hypoglycemia - amaryl and januvia stopped, and lantus decreased to 25 units;  renal fxn also noted mild to mod insufficient with cr 1.6 at d/c;  since d/c sugars have been mild elevated in the 200's, occasionally 300's;  denies polydipsia or polyuria and no further low sugars or symptoms associatedl  Pt denies CP, sob, doe, wheezing, orthopnea, pnd, worsening LE edema, palps, dizziness or syncope   Pt denies new neuro symptoms such as headache, facial or extremity weakness   Overall good compliance with meds, trying to follow low chol, DM diet, wt stable, little excercise however .  Walks with cane, no recent falls or injury.  Denies fever, ST, cough,  or GU symptoms such as pain, urgency, freq or hematuria.    Problems Prior to Update: 1)  Atrioventricular Block, 2nd Degree  (ICD-426.13) 2)  Anemia-iron Deficiency  (ICD-280.9) 3)  Renal Insufficiency  (ICD-588.9) 4)  Other Second Degree Atrioventricular Block  (ICD-426.13) 5)  Chronic Systolic Heart Failure  (ICD-428.22) 6)  Peripheral Neuropathy  (ICD-356.9) 7)  Diastolic Heart Failure, Acute On Chronic  (ICD-428.33) 8)  Insomnia  (ICD-780.52) 9)  Unspecified Anemia  (ICD-285.9) 10)  Hypokalemia, Mild  (ICD-276.8) 11)  Other Second Degree Atrioventricular Block  (ICD-426.13) 12)  Colonic Polyps, Hx of  (ICD-V12.72) 13)  Prostate Cancer, Hx of  (ICD-V10.46) 14)  Anxiety  (ICD-300.00) 15)  Peptic Ulcer Disease   (ICD-533.90) 16)  Gout  (ICD-274.9) 17)  Coronary Artery Disease  (ICD-414.00) 18)  Chronic Kidney Disease Stage Iii (MODERATE)  (ICD-585.3) 19)  Alcohol Abuse, Episodic, Hx of  (ICD-V11.3) 20)  Hyperthyroidism  (ICD-242.90) 21)  Preventive Health Care  (ICD-V70.0) 22)  Abnormal Ekg  (ICD-794.31) 23)  Obesity-morbid (>100')  (ICD-278.01) 24)  Long Term Use of Aspirin  (ICD-V58.66) 25)  Long Term High-risk Medication  (ICD-V58.69) 26)  Chronic Diastolic Heart Failure  (ICD-428.32) 27)  Atrial Fibrillation  (ICD-427.31) 28)  Obstructive Sleep Apnea  (ICD-327.23) 29)  Hyperlipidemia  (ICD-272.4) 30)  Hypertension  (ICD-401.9) 31)  Cerebrovascular Accident, Hx of  (ICD-V12.50) 32)  Benign Prostatic Hypertrophy  (ICD-600.00) 33)  Osteoarthritis  (ICD-715.90) 34)  Diabetes Mellitus, Type II, Uncontrolled  (ICD-250.02)  Medications Prior to Update: 1)  Lipitor 40 Mg  Tabs (Atorvastatin Calcium) .... Take 1/2 Tab By Mouth Once Daily 2)  Amiodarone Hcl 200 Mg  Tabs (Amiodarone Hcl) .... Take One -Half Tablet By Mouth Once A Day (100mg ) 3)  Glimepiride 4 Mg  Tabs (Glimepiride) .... Take 1 Tablet By Mouth Two Times A Day 4)  Allopurinol 100 Mg  Tabs (Allopurinol) .... Take 1 Tablet By Mouth Once A Day 5)  Bayer Aspirin 325 Mg  Tabs (Aspirin) .... Take 1 Tablet By Mouth Once A Day 6)  Klor-Con M20 20 Meq Cr-Tabs (Potassium Chloride Crys Cr) .Marland Kitchen.. 1 By Mouth Three Times A Day 7)  Isosorbide Mononitrate Cr 120 Mg Xr24h-Tab (Isosorbide Mononitrate) .Marland Kitchen.. 1po Once Daily 8)  Torsemide 20 Mg Tabs (Torsemide) .Marland Kitchen.. 1 Tablets By Mouth  Two Times A Day 9)  Januvia 50 Mg Tabs (Sitagliptin Phosphate) .Marland Kitchen.. 1po Once Daily 10)  Omeprazole 20 Mg Tbec (Omeprazole) .Marland Kitchen.. 1 By Mouth Once Daily 11)  Levothroid 112 Mcg Tabs (Levothyroxine Sodium) .... Daily 12)  Lantus 100 Unit/ml Soln (Insulin Glargine) .... 70 Units Subcutaneously Once Daily 13)  Colace 100 Mg Caps (Docusate Sodium) .Marland Kitchen.. 1 By Mouth Two Times A  Day 14)  Ambien 5 Mg Tabs (Zolpidem Tartrate) .... Take 1 Tablet By Mouth At Bedtime 15)  Bd Pen Needle Short U/f 31g X 8 Mm Misc (Insulin Pen Needle) .... As Directed Patient Needs Office Visit 16)  Hydralazine Hcl 100 Mg Tabs (Hydralazine Hcl) .Marland Kitchen.. 1po Three Times A Day 17)  Spironolactone 25 Mg Tabs (Spironolactone) .... Take 1/2 Tablet By Mouth Daily 18)  Oxycodone Hcl 5 Mg Tabs (Oxycodone Hcl) .Marland Kitchen.. 1 By Mouth Q 4 Hrs As Needed 19)  Mobility Scooter .... Use Asd 20)  Klonopin 0.5 Mg Tabs (Clonazepam) .... 1/2 Tab Two Times A Day  Current Medications (verified): 1)  Lipitor 40 Mg  Tabs (Atorvastatin Calcium) .... Take 1/2 Tab By Mouth Once Daily 2)  Amiodarone Hcl 200 Mg  Tabs (Amiodarone Hcl) .... Take One -Half Tablet By Mouth Once A Day (100mg ) 3)  Allopurinol 100 Mg  Tabs (Allopurinol) .... Take 1 Tablet By Mouth Once A Day 4)  Bayer Aspirin 325 Mg  Tabs (Aspirin) .... Take 1 Tablet By Mouth Once A Day 5)  Klor-Con M20 20 Meq Cr-Tabs (Potassium Chloride Crys Cr) .Marland Kitchen.. 1 By Mouth Three Times A Day 6)  Isosorbide Mononitrate Cr 120 Mg Xr24h-Tab (Isosorbide Mononitrate) .Marland Kitchen.. 1po Once Daily 7)  Torsemide 20 Mg Tabs (Torsemide) .Marland Kitchen.. 1 Tablets By Mouth  Two Times A Day 8)  Omeprazole 20 Mg Tbec (Omeprazole) .Marland Kitchen.. 1 By Mouth Once Daily 9)  Levothroid 112 Mcg Tabs (Levothyroxine Sodium) .... Daily 10)  Lantus 100 Unit/ml Soln (Insulin Glargine) .... 70 Units Subcutaneously Once Daily 11)  Colace 100 Mg Caps (Docusate Sodium) .Marland Kitchen.. 1 By Mouth Two Times A Day 12)  Ambien 5 Mg Tabs (Zolpidem Tartrate) .... Take 1 Tablet By Mouth At Bedtime 13)  Bd Pen Needle Short U/f 31g X 8 Mm Misc (Insulin Pen Needle) .... As Directed Patient Needs Office Visit 14)  Hydralazine Hcl 100 Mg Tabs (Hydralazine Hcl) .Marland Kitchen.. 1po Three Times A Day 15)  Spironolactone 25 Mg Tabs (Spironolactone) .... Take 1/2 Tablet By Mouth Daily 16)  Oxycodone Hcl 5 Mg Tabs (Oxycodone Hcl) .Marland Kitchen.. 1 By Mouth Q 4 Hrs As Needed 17)  Klonopin  0.5 Mg Tabs (Clonazepam) .... 1/2 Tab Two Times A Day As Needed 18)  Humalog Kwikpen 100 Unit/ml Soln (Insulin Lispro (Human)) .... Use Asd Three Times A Day/before Meals 19)  Humalog Kwikpen Needles .... Use Asd Three Times A Day  Allergies (verified): 1)  ! Ace Inhibitors 2)  ! Beta Blockers  Past History:  Past Medical History: Last updated: 06/03/2009 HEART FAILURE (ICD-428.9) secoccndary to Diastolic dysfunction EF     --previous EF 35-45%    --ef 60% 4/09 Atrial fibrillation   --maintained in sinus on amiodarone. thought to be poor coumadin candidate STROKE (ICD-434.91) 03/1999 - chronic dysarthria HYPERLIPIDEMIA (ICD-272.4) HYPERTENSION (  ICD-401.9) BENIGN PROSTATIC HYPERTROPHY (ICD-600.00) OSTEOARTHRITIS (ICD-715.90) DIABETES MELLITUS, TYPE II (ICD-250.00) INTOLERANCE TO ACEI/ARBS SECONDARY TO EXTREME HYPOTENSION/WORSENING RENAL FAILURE MOBITZ 1 HEART BLOCK REQUIRING D/C OF BETA BLOCKERS AV block - second degree, not felt to be pacemaker candidate Obstrcutive sleep apnea Obesity Iron deficiency anemia   --s/p EGD and coloscopy 3/10. gastritis. hemorrhids. Cerebrovascular accident, hx of Prostate cancer, hx of Renal insufficiency  Past Surgical History: Last updated: 06/06/2007 Cyst Removal history of prostate surgery  Social History: Last updated: 01/22/2008 Single Divorced  Tobacco Use - No. (HISTORY) Alcohol Use - no (HISTORY OF ABUSE) Regular Exercise - no AMBULATES W/CANE OR WALKER DRUG USE-NO  Risk Factors: Exercise: no (01/22/2008)  Risk Factors: Smoking Status: never (01/22/2008)  Review of Systems       all otherwise negative per pt -    Physical Exam  General:  alert and overweight-appearing.   Head:  normocephalic and atraumatic.   Eyes:  vision grossly intact, pupils equal, and pupils round.   Ears:  R ear normal and L ear normal.   Nose:  no external deformity and no nasal discharge.   Mouth:  no gingival abnormalities and pharynx  pink and moist.   Neck:  supple and no masses.   Lungs:  normal respiratory effort and normal breath sounds.   Heart:  normal rate and regular rhythm.   Abdomen:  soft, non-tender, and normal bowel sounds.   Msk:  no flank tender Extremities:  no edema, no erythema    Impression & Recommendations:  Problem # 1:  DIABETES MELLITUS, TYPE II, UNCONTROLLED (ICD-250.02)  The following medications were removed from the medication list:    Glimepiride 4 Mg Tabs (Glimepiride) .Marland Kitchen... Take 1 tablet by mouth two times a day    Januvia 50 Mg Tabs (Sitagliptin phosphate) .Marland Kitchen... 1po once daily His updated medication list for this problem includes:    Bayer Aspirin 325 Mg Tabs (Aspirin) .Marland Kitchen... Take 1 tablet by mouth once a day    Lantus 100 Unit/ml Soln (Insulin glargine) .Marland KitchenMarland KitchenMarland KitchenMarland Kitchen 70 units subcutaneously once daily    Humalog Kwikpen 100 Unit/ml Soln (Insulin lispro (human)) ..... Use asd three times a day/before meals  Labs Reviewed: Creat: 1.9 (06/29/2009)    Reviewed HgBA1c results: 7.7 (06/03/2009)  8.3 (07/13/2008) meds adjusted last hospn, now mild to mod uncontrolled ;  to add SSI insulin  Problem # 2:  ATRIAL FIBRILLATION (ICD-427.31)  His updated medication list for this problem includes:    Amiodarone Hcl 200 Mg Tabs (Amiodarone hcl) .Marland Kitchen... Take one -half tablet by mouth once a day (100mg )    Bayer Aspirin 325 Mg Tabs (Aspirin) .Marland Kitchen... Take 1 tablet by mouth once a day regular rate and volume ok;  Continue all previous medications as before this visit   Problem # 3:  RENAL INSUFFICIENCY (ICD-588.9) d/w pt  - declines labs today since just recently hospd with mult draws;  f/u next visit  Problem # 4:  HYPERTENSION (ICD-401.9)  His updated medication list for this problem includes:    Torsemide 20 Mg Tabs (Torsemide) .Marland Kitchen... 1 tablets by mouth  two times a day    Hydralazine Hcl 100 Mg Tabs (Hydralazine hcl) .Marland Kitchen... 1po three times a day    Spironolactone 25 Mg Tabs (Spironolactone) .Marland Kitchen... Take  1/2 tablet by mouth daily  BP today: 146/80 Prior BP: 152/66 (06/03/2009)  Labs Reviewed: K+: 4.9 (06/29/2009) Creat: : 1.9 (06/29/2009)   Chol: 137 (06/03/2009)   HDL: 43.40 (06/03/2009)   LDL:  73 (06/03/2009)   TG: 102.0 (06/03/2009) mild elevated today;  Continue all previous medications as before this visit  - pt declines other med change at this time since starting the insulin above  Complete Medication List: 1)  Lipitor 40 Mg Tabs (Atorvastatin calcium) .... Take 1/2 tab by mouth once daily 2)  Amiodarone Hcl 200 Mg Tabs (Amiodarone hcl) .... Take one -half tablet by mouth once a day (100mg ) 3)  Allopurinol 100 Mg Tabs (Allopurinol) .... Take 1 tablet by mouth once a day 4)  Bayer Aspirin 325 Mg Tabs (Aspirin) .... Take 1 tablet by mouth once a day 5)  Klor-con M20 20 Meq Cr-tabs (Potassium chloride crys cr) .Marland Kitchen.. 1 by mouth three times a day 6)  Isosorbide Mononitrate Cr 120 Mg Xr24h-tab (Isosorbide mononitrate) .Marland Kitchen.. 1po once daily 7)  Torsemide 20 Mg Tabs (Torsemide) .Marland Kitchen.. 1 tablets by mouth  two times a day 8)  Omeprazole 20 Mg Tbec (Omeprazole) .Marland Kitchen.. 1 by mouth once daily 9)  Levothroid 112 Mcg Tabs (Levothyroxine sodium) .... Daily 10)  Lantus 100 Unit/ml Soln (Insulin glargine) .... 70 units subcutaneously once daily 11)  Colace 100 Mg Caps (Docusate sodium) .Marland Kitchen.. 1 by mouth two times a day 12)  Ambien 5 Mg Tabs (Zolpidem tartrate) .... Take 1 tablet by mouth at bedtime 13)  Bd Pen Needle Short U/f 31g X 8 Mm Misc (Insulin pen needle) .... As directed patient needs office visit 14)  Hydralazine Hcl 100 Mg Tabs (Hydralazine hcl) .Marland Kitchen.. 1po three times a day 15)  Spironolactone 25 Mg Tabs (Spironolactone) .... Take 1/2 tablet by mouth daily 16)  Oxycodone Hcl 5 Mg Tabs (Oxycodone hcl) .Marland Kitchen.. 1 by mouth q 4 hrs as needed 17)  Klonopin 0.5 Mg Tabs (Clonazepam) .... 1/2 tab two times a day as needed 18)  Humalog Kwikpen 100 Unit/ml Soln (Insulin lispro (human)) .... Use asd three times a  day/before meals 19)  Humalog Kwikpen Needles  .... Use asd three times a day  Patient Instructions: 1)  increase the lantus to 30 units per day , about 2 to 3 PM every day 2)  also, start the Humalog kwikpen with insulin dose depending on the blood sugar (to be done before each meal/tid) 3)  For Blood sugar:  less than 150    -   None 4)  For Blood sugar   151 - 200   2 units 5)  For Blood sugar   201 - 250   4 units 6)  For Blood sugar   251 - 300    6 units 7)  For Blood sugar   301 -  350   8 units 8)  For Blood sugar   more than 350  -   10 units 9)  Please schedule a follow-up appointment in 2 months. 10)  Continue all previous medications as before this visit  Prescriptions: HUMALOG KWIKPEN NEEDLES use asd three times a day  #300 x 11   Entered and Authorized by:   Corwin Levins MD   Signed by:   Corwin Levins MD on 07/19/2009   Method used:   Print then Give to Patient   RxID:   [id number] HUMALOG KWIKPEN 100 UNIT/ML SOLN (INSULIN LISPRO (HUMAN)) use asd three times a day/before meals  #1 box x 11   Entered and Authorized by:   Corwin Levins MD   Signed by:   Corwin Levins MD on 07/19/2009   Method used:  Print then Give to Patient   RxID:   [id number] KLONOPIN 0.5 MG TABS (CLONAZEPAM) 1/2 tab two times a day as needed  #60 x 3   Entered and Authorized by:   Corwin Levins MD   Signed by:   Corwin Levins MD on 07/19/2009   Method used:   Print then Give to Patient   RxID:   [id number]

## 2010-03-23 NOTE — Letter (Signed)
Summary: DME documentation/[facility]   DME documentation/[facility]   Imported By: Lester Hiko 06/15/2009 08:05:23  _____________________________________________________________________  External Attachment:    Type:   Image     Comment:   External Document

## 2010-03-23 NOTE — Assessment & Plan Note (Signed)
Summary: EPH   Referring Provider:  Arvilla Meres Primary Provider:  Oliver Kim   History of Present Illness: [name] is a very pleasant 77 year old male with  a history of diastolic heart failure as well as 2nd degree heart block (Wenckebach), hypertension,  hyperlipidemia, diabetes, previous CVA, paroxysmal atrial fibrillation on amiodarone. Not on coumadin as felt to be high risk of bleeding due to h/o ETOH.  Recently hospitalized for hypoglycemic episodes from taking too much insulin. Also had some brief runs of PAF.   Returns for post-hospital f/u. From a cardiac perspective doing fairly well. No significant edema, CP or No palpitations or syncope. Taking medicines as prescribed.     Current Medications (verified): 1)  Lipitor 40 Mg  Tabs (Atorvastatin Calcium) .... Take 1/2 Tab By Mouth Once Daily 2)  Amiodarone Hcl 200 Mg  Tabs (Amiodarone Hcl) .... Take One -Half Tablet By Mouth Once A Day (100mg ) 3)  Allopurinol 100 Mg  Tabs (Allopurinol) .... Take 1 Tablet By Mouth Once A Day 4)  Bayer Aspirin 325 Mg  Tabs (Aspirin) .... Take 1 Tablet By Mouth Once A Day 5)  Klor-Con M20 20 Meq Cr-Tabs (Potassium Chloride Crys Cr) .Marland Kitchen.. 1 By Mouth Three Times A Day 6)  Imdur 60 Mg Xr24h-Tab (Isosorbide Mononitrate) .Marland Kitchen.. 1 By Mouth Daily 7)  Torsemide 20 Mg Tabs (Torsemide) .Marland Kitchen.. 1 Tablets By Mouth  Two Times A Day 8)  Omeprazole 20 Mg Tbec (Omeprazole) .Marland Kitchen.. 1 By Mouth Once Daily 9)  Levothroid 125 Mcg Tabs (Levothyroxine Sodium) .Marland Kitchen.. 1 By Mouth Daily 10)  Lantus 100 Unit/ml Soln (Insulin Glargine) .... 70 Units Subcutaneously Once Daily 11)  Colace 100 Mg Caps (Docusate Sodium) .Marland Kitchen.. 1 By Mouth Two Times A Day 12)  Ambien 5 Mg Tabs (Zolpidem Tartrate) .... Take 1 Tablet By Mouth At Bedtime 13)  Bd Pen Needle Short U/f 31g X 8 Mm Misc (Insulin Pen Needle) .... As Directed Patient Needs Office Visit 14)  Hydralazine Hcl 50 Mg Tabs (Hydralazine Hcl) .Marland Kitchen.. 1 By Mouth Three Times A Day 15)   Spironolactone 25 Mg Tabs (Spironolactone) .Marland Kitchen.. 1 By Mouth Dialy 16)  Oxycodone Hcl 5 Mg Tabs (Oxycodone Hcl) .Marland Kitchen.. 1 By Mouth Q 4 Hrs As Needed 17)  Klonopin 0.5 Mg Tabs (Clonazepam) .... 1/2 Tab Two Times A Day As Needed  Allergies (verified): 1)  ! Ace Inhibitors 2)  ! Beta Blockers  Past History:  Past Medical History: Last updated: 06/03/2009 HEART FAILURE (ICD-428.9) secoccndary to Diastolic dysfunction EF     --previous EF 35-45%    --ef 60% 4/09 Atrial fibrillation   --maintained in sinus on amiodarone. thought to be poor coumadin candidate STROKE (ICD-434.91) 03/1999 - chronic dysarthria HYPERLIPIDEMIA (ICD-272.4) HYPERTENSION (ICD-401.9) BENIGN PROSTATIC HYPERTROPHY (ICD-600.00) OSTEOARTHRITIS (ICD-715.90) DIABETES MELLITUS, TYPE II (ICD-250.00) INTOLERANCE TO ACEI/ARBS SECONDARY TO EXTREME HYPOTENSION/WORSENING RENAL FAILURE MOBITZ 1 HEART BLOCK REQUIRING D/C OF BETA BLOCKERS AV block - second degree, not felt to be pacemaker candidate Obstrcutive sleep apnea Obesity Iron deficiency anemia   --s/p EGD and coloscopy 3/10. gastritis. hemorrhids. Cerebrovascular accident, hx of Prostate cancer, hx of Renal insufficiency  Review of Systems       As per HPI and past medical history; otherwise all systems negative.   Vital Signs:  Patient profile:   77 year old male Height:      70.5 inches Weight:      231 pounds BMI:     32.79 Pulse rate:   78 / minute Resp:  16 per minute BP sitting:   166 / 88  (right arm)  Vitals Entered By: Marrion Coy, CNA (August 04, 2009 2:56 PM)  Physical Exam  General:  Elderly male. no resp difficulty. +dysarthria. walks with cane HEENT: normal except for facial droop Neck: supple. JVP 8  Carotids 2+ bilat; no bruits.  Cor: PMI nondisplaced. Mildly irregular. No rubs, gallops. soft systolic murmur across precordium Lungs: clear Abdomen: obese .soft, nontender. No hepatomegaly. No bruits or masses. Good bowel  sounds. Extremities: no cyanosis, clubbing, rash., 2+ edema Neuro: alert & orientedx3, cranial nerves grossly intact except for facial droop. moves all 4 extremities w/o difficulty. affect pleasant   Impression & Recommendations:  Problem # 1:  DIASTOLIC HEART FAILURE, ACUTE ON CHRONIC (ICD-428.33) Doing fairly well but does have some fluid on board today. Weight up 7 pounds and 2+ edema. Will double demadex for 2 days and see if he gets back to baseline. Will contact us if weight not coming off.  Problem # 2:  ATRIAL FIBRILLATION (ICD-427.31) Paroxysmal. Maintaining SR fairly well on amio. not coumadin candidate due to h/o ETOH abuse/GIB and intermittent non-compliance.   Other Orders: EKG w/ Interpretation (93000)  Patient Instructions: 1)  Your physician recommends that you schedule a follow-up appointment in: 4 months  2)  Take Torsemide 20 mg tablets: 2 tablets twice a day for 2 days only.

## 2010-03-23 NOTE — Progress Notes (Signed)
Summary: low CBG!  Phone Note Call from Patient Call back at Home Phone [phone]   Caller: Daughter [name] Summary of Call: pt's daughter called stating that pt's cbgs have been very low specifically at night x 1 week. [name] says it has gotten as low as 5 (2 days ago) and pt seemed to be in a "coma-like state". Daughter did not take pt to ER or call EMS. Pt is better now and daughter has scheduled appt with [facility] 05/25. Daughter has also D/C'd pt insulin (55u once daily). Please advise, OV, Hospital or dosage adjustment? Initial call taken by: Margaret Pyle, CMA,  Jul 11, 2009 2:21 PM  Follow-up for Phone Call        for now, take HALF insulin, cont to monitor CBG's at least three times a day before meals, and call for any further low sugars Follow-up by: Corwin Levins MD,  Jul 11, 2009 2:36 PM  Additional Follow-up for Phone Call Additional follow up Details #1::        pt's daughter informed Additional Follow-up by: Margaret Pyle, CMA,  Jul 11, 2009 2:50 PM

## 2010-03-23 NOTE — Progress Notes (Signed)
Summary: FYI - [facility]  Phone Note From Other Clinic   Summary of Call: [facility] RN has to discharge pt due to fact that pt is not homebound. He goes to a senior center daily and wants to be more mobile. She gave him as much teaching info as possible. Pt and family are aware.  Initial call taken by: Lamar Sprinkles, CMA,  July 21, 2009 12:11 PM  Follow-up for Phone Call        noted Follow-up by: Corwin Levins MD,  July 21, 2009 1:11 PM

## 2010-03-23 NOTE — Assessment & Plan Note (Signed)
Summary: New / diabetes / [name]   Vital Signs:  Patient profile:   77 year old male Height:      70.5 inches (179.07 cm) Weight:      225.25 pounds (102.39 kg) BMI:     31.98 O2 Sat:      95 % on Room air Temp:     98.3 degrees F (36.83 degrees C) oral Pulse rate:   86 / minute BP sitting:   152 / 78  (left arm) Cuff size:   large  Vitals Entered By: Brenton Grills MA (December 13, 2009 3:19 PM)  O2 Flow:  Room air CC: New Endo/DMII/Dr. [name]/aj Is Patient Diabetic? Yes    Referring Provider:  Arvilla Meres Primary Provider:  Oliver Barre  CC:  New Endo/DMII/Dr. Araceli Bouche.  History of Present Illness: pt states 15 years h/o dm.  he has several chronic complications.  he has been on insulin x 18 mos.  he takes lantus 40 units once daily.  no cbg record, but states cbg's are in the mid-100's.   pt says his diet and exercise are "good."   symptomatically, pt states few years of moderate numbness of the feet, but no assoc pain.  he was seen in er for an episode of severe hypoglycemia 2 mos ago.   Current Medications (verified): 1)  Lipitor 40 Mg  Tabs (Atorvastatin Calcium) .... Take 1/2 Tab By Mouth Once Daily 2)  Amiodarone Hcl 200 Mg  Tabs (Amiodarone Hcl) .... Take One -Half Tablet By Mouth Once A Day (100mg ) 3)  Allopurinol 100 Mg  Tabs (Allopurinol) .... Take 1 Tablet By Mouth Once A Day 4)  Bayer Aspirin 325 Mg  Tabs (Aspirin) .... Take 1 Tablet By Mouth Once A Day 5)  Klor-Con M20 20 Meq Cr-Tabs (Potassium Chloride Crys Cr) .Marland Kitchen.. 1 By Mouth Three Times A Day 6)  Imdur 60 Mg Xr24h-Tab (Isosorbide Mononitrate) .Marland Kitchen.. 1 By Mouth Daily 7)  Torsemide 20 Mg Tabs (Torsemide) .Marland Kitchen.. 1 Tablets By Mouth  Two Times A Day 8)  Omeprazole 20 Mg Tbec (Omeprazole) .Marland Kitchen.. 1 By Mouth Once Daily 9)  Levothroid 125 Mcg Tabs (Levothyroxine Sodium) .Marland Kitchen.. 1 By Mouth Daily 10)  Lantus 100 Unit/ml Soln (Insulin Glargine) .... 40 Units Subcutaneously Once Daily 11)  Ambien 5 Mg Tabs (Zolpidem Tartrate)  .... Take 1 Tablet By Mouth At Bedtime 12)  Hydralazine Hcl 50 Mg Tabs (Hydralazine Hcl) .Marland Kitchen.. 1 By Mouth Three Times A Day 13)  Spironolactone 25 Mg Tabs (Spironolactone) .Marland Kitchen.. 1 By Mouth Dialy 14)  Citalopram Hydrobromide 10 Mg Tabs (Citalopram Hydrobromide) .Marland Kitchen.. 1 By Mouth Once Daily 15)  Januvia 100 Mg Tabs (Sitagliptin Phosphate) .Marland Kitchen.. 1po Once Daily  Allergies (verified): 1)  ! Ace Inhibitors 2)  ! Beta Blockers  Past History:  Past Medical History: Last updated: 06/03/2009 HEART FAILURE (ICD-428.9) secoccndary to Diastolic dysfunction EF     --previous EF 35-45%    --ef 60% 4/09 Atrial fibrillation   --maintained in sinus on amiodarone. thought to be poor coumadin candidate STROKE (ICD-434.91) 03/1999 - chronic dysarthria HYPERLIPIDEMIA (ICD-272.4) HYPERTENSION (ICD-401.9) BENIGN PROSTATIC HYPERTROPHY (ICD-600.00) OSTEOARTHRITIS (ICD-715.90) DIABETES MELLITUS, TYPE II (ICD-250.00) INTOLERANCE TO ACEI/ARBS SECONDARY TO EXTREME HYPOTENSION/WORSENING RENAL FAILURE MOBITZ 1 HEART BLOCK REQUIRING D/C OF BETA BLOCKERS AV block - second degree, not felt to be pacemaker candidate Obstrcutive sleep apnea Obesity Iron deficiency anemia   --s/p EGD and coloscopy 3/10. gastritis. hemorrhids. Cerebrovascular accident, hx of Prostate cancer, hx of Renal insufficiency  Family  History: Reviewed history from 01/22/2008 and no changes required. Father - heart disease Family History of Hypertension:  no dm  Social History: Reviewed history from 01/22/2008 and no changes required. Single Divorced  Tobacco Use - No. (HISTORY) Alcohol Use - no (HISTORY OF ABUSE) Regular Exercise - no AMBULATES W/CANE OR WALKER DRUG USE-NO retired  Review of Systems       The patient complains of weight loss.  The patient denies fever.         denies blurry vision, headache, chest pain, sob, n/v, cramps, excessive diaphoresis, memory loss, depression, hypoglycemia, and easy bruising.  he  attributes polyuria to torsemide.  he has intermittent rhinorrhea.     Physical Exam  General:  normal appearance.   Head:  head: no deformity eyes: no periorbital swelling, no proptosis external nose and ears are normal mouth: no lesion seen Neck:  Supple without thyroid enlargement or tenderness.  Lungs:  Clear to auscultation bilaterally. Normal respiratory effort.  Heart:  Regular rate and rhythm without murmurs or gallops noted. Normal S1,S2.   Abdomen:  abdomen is soft, nontender.  no hepatosplenomegaly.   not distended.  no hernia  Msk:  muscle bulk and strength are grossly normal.  no obvious joint swelling.  gait is steady, with a cane  Pulses:  dorsalis pedis intact bilat.  no carotid bruit  Extremities:  no deformity.  no ulcer on the feet.  feet are of normal color and temp.   trace right pedal edema and 1+ left pedal edema.   Neurologic:  cn 2-12 grossly intact.   readily moves all 4's.   sensation is intact to touch on the feet. Skin:  normal texture and temp.  no rash.  not diaphoretic  Cervical Nodes:  No significant adenopathy.  Psych:  Alert and cooperative; normal mood and affect; normal attention span and concentration.   Additional Exam:  Hemoglobin A1C       [H]  13.7 %   Impression & Recommendations:  Problem # 1:  DIABETES MELLITUS, TYPE II, UNCONTROLLED (ICD-250.02) a1c is much higher than suggested by reported cbg's  HgbA1C: 13.7 (12/06/2009)  Problem # 2:  numbness prob due to #1  Problem # 3:  RENAL INSUFFICIENCY (ICD-588.9) prob due to #1  Other Orders: Consultation Level IV (16109)  Patient Instructions: 1)  good diet and exercise habits significanly improve the control of your diabetes.  please let me know if you wish to be referred to a dietician.  high blood sugar is very risky to your health.  you should see an eye doctor every year. 2)  controlling your blood pressure and cholesterol drastically reduces the damage diabetes does to  your body.  this also applies to quitting smoking.  please discuss these with your doctor.  you should take an aspirin every day, unless you have been advised by a doctor not to. 3)  check your blood sugar 2 times a day.  vary the time of day when you check, between before the 3 meals, and at bedtime.  also check if you have symptoms of your blood sugar being too high or too low.  please keep a record of the readings and bring it to your next appointment here.  please call us sooner if you are having low blood sugar episodes. 4)  we will need to take this complex situation in stages 5)  stop [place]. 6)  same lantus for now. 7)  Please schedule a follow-up appointment in 2  weeks.    Orders Added: 1)  Consultation Level IV [19147]

## 2010-03-23 NOTE — Miscellaneous (Signed)
Summary: Orders Update  Clinical Lists Changes  Orders: Added new Test order of T-2 View CXR (71020TC) - Signed 

## 2010-03-23 NOTE — Assessment & Plan Note (Signed)
Summary: 2 MTH FU  STC   Vital Signs:  Patient profile:   77 year old male Height:      70.5 inches Weight:      228.13 pounds BMI:     32.39 O2 Sat:      98 % on Room air Temp:     97.1 degrees F oral Pulse rate:   82 / minute BP sitting:   160 / 68  (left arm) Cuff size:   large  Vitals Entered By: Zella Ball Ewing CMA (AAMA) (September 19, 2009 1:34 PM)  O2 Flow:  Room air  CC: 2 month followup/RE   Primary Care Provider:  Oliver Barre  CC:  2 month followup/RE.  History of Present Illness: overall doing well.   Pt denies CP, sob, doe, wheezing, orthopnea, pnd, worsening LE edema, palps, dizziness or syncope-  Pt denies new neuro symptoms such as headache, facial or extremity weakness  Pt denies polydipsia, polyuria, or low sugar symptoms such as shakiness improved with eating.  Overall good compliance with meds, trying to follow low chol, DM diet, wt stable, little excercise however  No fever, wt loss, night sweats, loss of appetite or other constitutional symptoms  Did not actually start the humalog SSI as he did not have enough family support to check the  cbgs and give the shots.    Problems Prior to Update: 1)  Atrioventricular Block, 2nd Degree  (ICD-426.13) 2)  Anemia-iron Deficiency  (ICD-280.9) 3)  Renal Insufficiency  (ICD-588.9) 4)  Other Second Degree Atrioventricular Block  (ICD-426.13) 5)  Chronic Systolic Heart Failure  (ICD-428.22) 6)  Peripheral Neuropathy  (ICD-356.9) 7)  Diastolic Heart Failure, Acute On Chronic  (ICD-428.33) 8)  Insomnia  (ICD-780.52) 9)  Unspecified Anemia  (ICD-285.9) 10)  Hypokalemia, Mild  (ICD-276.8) 11)  Other Second Degree Atrioventricular Block  (ICD-426.13) 12)  Colonic Polyps, Hx of  (ICD-V12.72) 13)  Prostate Cancer, Hx of  (ICD-V10.46) 14)  Anxiety  (ICD-300.00) 15)  Peptic Ulcer Disease  (ICD-533.90) 16)  Gout  (ICD-274.9) 17)  Coronary Artery Disease  (ICD-414.00) 18)  Chronic Kidney Disease Stage Iii (MODERATE)   (ICD-585.3) 19)  Alcohol Abuse, Episodic, Hx of  (ICD-V11.3) 20)  Hyperthyroidism  (ICD-242.90) 21)  Preventive Health Care  (ICD-V70.0) 22)  Abnormal Ekg  (ICD-794.31) 23)  Obesity-morbid (>100')  (ICD-278.01) 24)  Long Term Use of Aspirin  (ICD-V58.66) 25)  Long Term High-risk Medication  (ICD-V58.69) 26)  Chronic Diastolic Heart Failure  (ICD-428.32) 27)  Atrial Fibrillation  (ICD-427.31) 28)  Obstructive Sleep Apnea  (ICD-327.23) 29)  Hyperlipidemia  (ICD-272.4) 30)  Hypertension  (ICD-401.9) 31)  Cerebrovascular Accident, Hx of  (ICD-V12.50) 32)  Benign Prostatic Hypertrophy  (ICD-600.00) 33)  Osteoarthritis  (ICD-715.90) 34)  Diabetes Mellitus, Type II, Uncontrolled  (ICD-250.02)  Medications Prior to Update: 1)  Lipitor 40 Mg  Tabs (Atorvastatin Calcium) .... Take 1/2 Tab By Mouth Once Daily 2)  Amiodarone Hcl 200 Mg  Tabs (Amiodarone Hcl) .... Take One -Half Tablet By Mouth Once A Day (100mg ) 3)  Allopurinol 100 Mg  Tabs (Allopurinol) .... Take 1 Tablet By Mouth Once A Day 4)  Bayer Aspirin 325 Mg  Tabs (Aspirin) .... Take 1 Tablet By Mouth Once A Day 5)  Klor-Con M20 20 Meq Cr-Tabs (Potassium Chloride Crys Cr) .Marland Kitchen.. 1 By Mouth Three Times A Day 6)  Imdur 60 Mg Xr24h-Tab (Isosorbide Mononitrate) .Marland Kitchen.. 1 By Mouth Daily 7)  Torsemide 20 Mg Tabs (Torsemide) .Marland Kitchen.. 1 Tablets  By Mouth  Two Times A Day 8)  Omeprazole 20 Mg Tbec (Omeprazole) .Marland Kitchen.. 1 By Mouth Once Daily 9)  Levothroid 125 Mcg Tabs (Levothyroxine Sodium) .Marland Kitchen.. 1 By Mouth Daily 10)  Lantus 100 Unit/ml Soln (Insulin Glargine) .... 70 Units Subcutaneously Once Daily 11)  Colace 100 Mg Caps (Docusate Sodium) .Marland Kitchen.. 1 By Mouth Two Times A Day 12)  Ambien 5 Mg Tabs (Zolpidem Tartrate) .... Take 1 Tablet By Mouth At Bedtime 13)  Bd Pen Needle Short U/f 31g X 8 Mm Misc (Insulin Pen Needle) .... As Directed Patient Needs Office Visit 14)  Hydralazine Hcl 50 Mg Tabs (Hydralazine Hcl) .Marland Kitchen.. 1 By Mouth Three Times A Day 15)   Spironolactone 25 Mg Tabs (Spironolactone) .Marland Kitchen.. 1 By Mouth Dialy 16)  Oxycodone Hcl 5 Mg Tabs (Oxycodone Hcl) .Marland Kitchen.. 1 By Mouth Q 4 Hrs As Needed 17)  Klonopin 0.5 Mg Tabs (Clonazepam) .... 1/2 Tab Two Times A Day As Needed  Current Medications (verified): 1)  Lipitor 40 Mg  Tabs (Atorvastatin Calcium) .... Take 1/2 Tab By Mouth Once Daily 2)  Amiodarone Hcl 200 Mg  Tabs (Amiodarone Hcl) .... Take One -Half Tablet By Mouth Once A Day (100mg ) 3)  Allopurinol 100 Mg  Tabs (Allopurinol) .... Take 1 Tablet By Mouth Once A Day 4)  Bayer Aspirin 325 Mg  Tabs (Aspirin) .... Take 1 Tablet By Mouth Once A Day 5)  Klor-Con M20 20 Meq Cr-Tabs (Potassium Chloride Crys Cr) .Marland Kitchen.. 1 By Mouth Three Times A Day 6)  Imdur 60 Mg Xr24h-Tab (Isosorbide Mononitrate) .Marland Kitchen.. 1 By Mouth Daily 7)  Torsemide 20 Mg Tabs (Torsemide) .Marland Kitchen.. 1 Tablets By Mouth  Two Times A Day 8)  Omeprazole 20 Mg Tbec (Omeprazole) .Marland Kitchen.. 1 By Mouth Once Daily 9)  Levothroid 125 Mcg Tabs (Levothyroxine Sodium) .Marland Kitchen.. 1 By Mouth Daily 10)  Lantus 100 Unit/ml Soln (Insulin Glargine) .... 25 Units Subcutaneously Once Daily 11)  Ambien 5 Mg Tabs (Zolpidem Tartrate) .... Take 1 Tablet By Mouth At Bedtime 12)  Hydralazine Hcl 50 Mg Tabs (Hydralazine Hcl) .Marland Kitchen.. 1 By Mouth Three Times A Day 13)  Spironolactone 25 Mg Tabs (Spironolactone) .Marland Kitchen.. 1 By Mouth Dialy 14)  Citalopram Hydrobromide 10 Mg Tabs (Citalopram Hydrobromide) .Marland Kitchen.. 1 By Mouth Once Daily  Allergies (verified): 1)  ! Ace Inhibitors 2)  ! Beta Blockers  Past History:  Past Medical History: Last updated: 06/03/2009 HEART FAILURE (ICD-428.9) secoccndary to Diastolic dysfunction EF     --previous EF 35-45%    --ef 60% 4/09 Atrial fibrillation   --maintained in sinus on amiodarone. thought to be poor coumadin candidate STROKE (ICD-434.91) 03/1999 - chronic dysarthria HYPERLIPIDEMIA (ICD-272.4) HYPERTENSION (ICD-401.9) BENIGN PROSTATIC HYPERTROPHY (ICD-600.00) OSTEOARTHRITIS  (ICD-715.90) DIABETES MELLITUS, TYPE II (ICD-250.00) INTOLERANCE TO ACEI/ARBS SECONDARY TO EXTREME HYPOTENSION/WORSENING RENAL FAILURE MOBITZ 1 HEART BLOCK REQUIRING D/C OF BETA BLOCKERS AV block - second degree, not felt to be pacemaker candidate Obstrcutive sleep apnea Obesity Iron deficiency anemia   --s/p EGD and coloscopy 3/10. gastritis. hemorrhids. Cerebrovascular accident, hx of Prostate cancer, hx of Renal insufficiency  Past Surgical History: Last updated: 06/06/2007 Cyst Removal history of prostate surgery  Social History: Last updated: 01/22/2008 Single Divorced  Tobacco Use - No. (HISTORY) Alcohol Use - no (HISTORY OF ABUSE) Regular Exercise - no AMBULATES W/CANE OR WALKER DRUG USE-NO  Risk Factors: Exercise: no (01/22/2008)  Risk Factors: Smoking Status: never (01/22/2008)  Review of Systems       all otherwise negative per pt -  Physical Exam  General:  alert and overweight-appearing.   Head:  normocephalic and atraumatic.   Eyes:  vision grossly intact, pupils equal, and pupils round.   Ears:  R ear normal and L ear normal.   Nose:  no external deformity and no nasal discharge.   Mouth:  no gingival abnormalities and pharynx pink and moist.   Neck:  supple and no masses.   Lungs:  normal respiratory effort and normal breath sounds.   Heart:  normal rate and regular rhythm.   Abdomen:  soft, non-tender, and normal bowel sounds.   Extremities:  no edema, no erythema    Impression & Recommendations:  Problem # 1:  HYPERTENSION (ICD-401.9)  His updated medication list for this problem includes:    Torsemide 20 Mg Tabs (Torsemide) .Marland Kitchen... 1 tablets by mouth  two times a day    Hydralazine Hcl 50 Mg Tabs (Hydralazine hcl) .Marland Kitchen... 1 by mouth three times a day    Spironolactone 25 Mg Tabs (Spironolactone) .Marland Kitchen... 1 by mouth dialy  BP today: 160/68 Prior BP: 166/88 (08/04/2009)  Labs Reviewed: K+: 4.9 (06/29/2009) Creat: : 1.9 (06/29/2009)   Chol:  137 (06/03/2009)   HDL: 43.40 (06/03/2009)   LDL: 73 (06/03/2009)   TG: 102.0 (06/03/2009) stable overall by hx and exam, ok to continue meds/tx as is - declines further meds at this time  Problem # 2:  DIABETES MELLITUS, TYPE II, UNCONTROLLED (ICD-250.02)  His updated medication list for this problem includes:    Bayer Aspirin 325 Mg Tabs (Aspirin) .Marland Kitchen... Take 1 tablet by mouth once a day    Lantus 100 Unit/ml Soln (Insulin glargine) .Marland Kitchen... 25 units subcutaneously once daily  Labs Reviewed: Creat: 1.9 (06/29/2009)    Reviewed HgBA1c results: 7.7 (06/03/2009)  8.3 (07/13/2008) did not "tolerate" the Humalog SSI - stable overall by hx and exam, ok to continue meds/tx as is , Pt to cont DM diet, excercise, wt loss efforts; to check labs today   Orders: TLB-BMP (Basic Metabolic Panel-BMET) (80048-METABOL) TLB-A1C / Hgb A1C (Glycohemoglobin) (83036-A1C) TLB-Lipid Panel (80061-LIPID)  Problem # 3:  HYPERLIPIDEMIA (ICD-272.4)  His updated medication list for this problem includes:    Lipitor 40 Mg Tabs (Atorvastatin calcium) .Marland Kitchen... Take 1/2 tab by mouth once daily  Labs Reviewed: SGOT: 19 (06/03/2009)   SGPT: 18 (06/03/2009)   HDL:43.40 (06/03/2009), 31.90 (05/25/2008)  LDL:73 (06/03/2009), 39 (05/25/2008)  Chol:137 (06/03/2009), 106 (05/25/2008)  Trig:102.0 (06/03/2009), 175.0 (05/25/2008) stable overall by hx and exam, ok to continue meds/tx as is , goal ldl < 70  Problem # 4:  DIASTOLIC HEART FAILURE, ACUTE ON CHRONIC (ICD-428.33)  His updated medication list for this problem includes:    Bayer Aspirin 325 Mg Tabs (Aspirin) .Marland Kitchen... Take 1 tablet by mouth once a day    Torsemide 20 Mg Tabs (Torsemide) .Marland Kitchen... 1 tablets by mouth  two times a day    Spironolactone 25 Mg Tabs (Spironolactone) .Marland Kitchen... 1 by mouth dialy resolved, stable overall by hx and exam, ok to continue meds/tx as is   Complete Medication List: 1)  Lipitor 40 Mg Tabs (Atorvastatin calcium) .... Take 1/2 tab by mouth once  daily 2)  Amiodarone Hcl 200 Mg Tabs (Amiodarone hcl) .... Take one -half tablet by mouth once a day (100mg ) 3)  Allopurinol 100 Mg Tabs (Allopurinol) .... Take 1 tablet by mouth once a day 4)  Bayer Aspirin 325 Mg Tabs (Aspirin) .... Take 1 tablet by mouth once a day 5)  Klor-con M20  20 Meq Cr-tabs (Potassium chloride crys cr) .Marland Kitchen.. 1 by mouth three times a day 6)  Imdur 60 Mg Xr24h-tab (Isosorbide mononitrate) .Marland Kitchen.. 1 by mouth daily 7)  Torsemide 20 Mg Tabs (Torsemide) .Marland Kitchen.. 1 tablets by mouth  two times a day 8)  Omeprazole 20 Mg Tbec (Omeprazole) .Marland Kitchen.. 1 by mouth once daily 9)  Levothroid 125 Mcg Tabs (Levothyroxine sodium) .Marland Kitchen.. 1 by mouth daily 10)  Lantus 100 Unit/ml Soln (Insulin glargine) .... 25 units subcutaneously once daily 11)  Ambien 5 Mg Tabs (Zolpidem tartrate) .... Take 1 tablet by mouth at bedtime 12)  Hydralazine Hcl 50 Mg Tabs (Hydralazine hcl) .Marland Kitchen.. 1 by mouth three times a day 13)  Spironolactone 25 Mg Tabs (Spironolactone) .Marland Kitchen.. 1 by mouth dialy 14)  Citalopram Hydrobromide 10 Mg Tabs (Citalopram hydrobromide) .Marland Kitchen.. 1 by mouth once daily  Patient Instructions: 1)  Continue all previous medications as before this visit  2)  Please go to the Lab in the basement for your blood and/or urine tests today  3)  Please schedule a follow-up appointment in 6 months for "yearly medicare exam"

## 2010-03-23 NOTE — Progress Notes (Signed)
Summary: pt wants to start med  Phone Note Call from Patient Call back at [phone]   Caller: Daughter/ [place] Reason for Call: Talk to Nurse, Talk to Doctor Summary of Call: [name] has talked to Dr. Gala Romney about putting pt on Klonapine and [facility] [address] Initial call taken by: Omer Jack,  May 24, 2009 1:34 PM  Follow-up for Phone Call        Jesusita Oka, Do you want to prescribe or let Dr Jonny Ruiz? Meredith Staggers, RN  May 24, 2009 5:44 PM  Pt daughter calling back regarding the pt prescription want the medication called into pharmacy [facility]  May 25, 2009 11:15 AM  Additional Follow-up for Phone Call Additional follow up Details #1::        [name] was worried about his h/o ETOH addiction and did not want him to start Klonopin. she will d/w Dr. Jonny Ruiz. Dolores Patty, MD, [facility]  May 25, 2009 10:02 AM     Additional Follow-up for Phone Call Additional follow up Details #2::    RN s/w Rhunette Croft, Left Message To Call Back. Bernita Raisin, RN, BSN,  May 25, 2009 11:26 AM  Additional Follow-up for Phone Call Additional follow up Details #3:: Details for Additional Follow-up Action Taken: Spoke with patient's daughter...she has decided not to speak with PCP and wants Dr. Gala Romney to order Klonopin for him. Called her back again and LM that we will ask Dr.D.B. for order with dosing tomorrow and call her back. Additional Follow-up by: J REISS RN  New/Updated Medications: KLONOPIN 0.5 MG TABS (CLONAZEPAM) 1/2 tab two times a day Prescriptions: KLONOPIN 0.5 MG TABS (CLONAZEPAM) 1/2 tab two times a day  #30 x 0   Entered by:   Meredith Staggers, RN   Authorized by:   Dolores Patty, MD, [facility]   Signed by:   Meredith Staggers, RN on 05/26/2009   Method used:   Print then Give to Patient   RxID:   [id number]  per Dr Gala Romney ok to give pt Klonopin 0.5mg  1/2 tab two times a day for a 1 month supply and refills will need to come from  Dr Jonny Ruiz, pts daughter is aware, she will p/u prescription Meredith Staggers, RN  May 26, 2009 3:25 PM   Appended Document: pt wants to start med Pt dtr called about Klonopin Rx. Spoke w/ Herbert Seta - Rx is ready. Spoke w/ dtr and communicated this. She will arrange w/ family to pick up Rx in am.

## 2010-03-23 NOTE — Miscellaneous (Signed)
Summary: Orders/[facility]  Orders/[facility]   Imported By: Sherian Rein 07/21/2009 09:24:37  _____________________________________________________________________  External Attachment:    Type:   Image     Comment:   External Document

## 2010-03-23 NOTE — Assessment & Plan Note (Signed)
Summary: 2 WK ROV /NWS   Vital Signs:  Patient profile:   77 year old male Height:      70.5 inches (179.07 cm) Weight:      224.50 pounds (102.05 kg) BMI:     31.87 O2 Sat:      96 % on Room air Temp:     98.5 degrees F (36.94 degrees C) oral Pulse rate:   100 / minute BP sitting:   132 / 72  (left arm) Cuff size:   large  Vitals Entered By: Brenton Grills CMA Duncan Dull) (December 27, 2009 3:17 PM)  O2 Flow:  Room air CC: 2 week F/U/aj Is Patient Diabetic? Yes   Referring Provider:  Arvilla Meres Primary Provider:  Oliver Barre  CC:  2 week F/U/aj.  History of Present Illness: pt c/o dry mouth.  he brings a record of his cbg's which i have reviewed today.  it varies from 250-450.  it is in general higher later in the day.    Current Medications (verified): 1)  Lipitor 40 Mg  Tabs (Atorvastatin Calcium) .... Take 1/2 Tab By Mouth Once Daily 2)  Amiodarone Hcl 200 Mg  Tabs (Amiodarone Hcl) .... Take One -Half Tablet By Mouth Once A Day (100mg ) 3)  Allopurinol 100 Mg  Tabs (Allopurinol) .... Take 1 Tablet By Mouth Once A Day 4)  Bayer Aspirin 325 Mg  Tabs (Aspirin) .... Take 1 Tablet By Mouth Once A Day 5)  Klor-Con M20 20 Meq Cr-Tabs (Potassium Chloride Crys Cr) .Marland Kitchen.. 1 By Mouth Three Times A Day 6)  Imdur 60 Mg Xr24h-Tab (Isosorbide Mononitrate) .Marland Kitchen.. 1 By Mouth Daily 7)  Torsemide 20 Mg Tabs (Torsemide) .Marland Kitchen.. 1 Tablets By Mouth  Two Times A Day 8)  Omeprazole 20 Mg Tbec (Omeprazole) .Marland Kitchen.. 1 By Mouth Once Daily 9)  Levothroid 125 Mcg Tabs (Levothyroxine Sodium) .Marland Kitchen.. 1 By Mouth Daily 10)  Lantus 100 Unit/ml Soln (Insulin Glargine) .... 40 Units Subcutaneously Once Daily 11)  Ambien 5 Mg Tabs (Zolpidem Tartrate) .... Take 1 Tablet By Mouth At Bedtime 12)  Hydralazine Hcl 50 Mg Tabs (Hydralazine Hcl) .Marland Kitchen.. 1 By Mouth Three Times A Day 13)  Spironolactone 25 Mg Tabs (Spironolactone) .Marland Kitchen.. 1 By Mouth Dialy 14)  Citalopram Hydrobromide 10 Mg Tabs (Citalopram Hydrobromide) .Marland Kitchen.. 1 By Mouth  Once Daily  Allergies (verified): 1)  ! Ace Inhibitors 2)  ! Beta Blockers  Past History:  Past Medical History: Last updated: 06/03/2009 HEART FAILURE (ICD-428.9) secoccndary to Diastolic dysfunction EF     --previous EF 35-45%    --ef 60% 4/09 Atrial fibrillation   --maintained in sinus on amiodarone. thought to be poor coumadin candidate STROKE (ICD-434.91) 03/1999 - chronic dysarthria HYPERLIPIDEMIA (ICD-272.4) HYPERTENSION (ICD-401.9) BENIGN PROSTATIC HYPERTROPHY (ICD-600.00) OSTEOARTHRITIS (ICD-715.90) DIABETES MELLITUS, TYPE II (ICD-250.00) INTOLERANCE TO ACEI/ARBS SECONDARY TO EXTREME HYPOTENSION/WORSENING RENAL FAILURE MOBITZ 1 HEART BLOCK REQUIRING D/C OF BETA BLOCKERS AV block - second degree, not felt to be pacemaker candidate Obstrcutive sleep apnea Obesity Iron deficiency anemia   --s/p EGD and coloscopy 3/10. gastritis. hemorrhids. Cerebrovascular accident, hx of Prostate cancer, hx of Renal insufficiency  Review of Systems  The patient denies hypoglycemia.    Physical Exam  General:  obese.  no distress  Psych:  Alert and cooperative; normal mood and affect; normal attention span and concentration.     Impression & Recommendations:  Problem # 1:  DIABETES MELLITUS, TYPE II, UNCONTROLLED (ICD-250.02) needs increased rx  Medications Added to Medication List  This Visit: 1)  Levemir Flexpen 100 Unit/ml Soln (Insulin detemir) .... 55 units each am  Other Orders: Est. Patient Level III (78295)  Patient Instructions: 1)  check your blood sugar 2 times a day.  vary the time of day when you check, between before the 3 meals, and at bedtime.  also check if you have symptoms of your blood sugar being too high or too low.  please keep a record of the readings and bring it to your next appointment here.  please call us sooner if you are having low blood sugar episodes. 2)  we will need to take this complex situation in stages 3)  change lantus to levemir, 55  units each am.  call if blood sugar stays over 200, so we can increase in between now and your next visit.   4)  Please schedule a follow-up appointment in 1 month.  Prescriptions: LEVEMIR FLEXPEN 100 UNIT/ML SOLN (INSULIN DETEMIR) 55 units each am  #2 boxes x 11   Entered and Authorized by:   Minus Breeding MD   Signed by:   Minus Breeding MD on 12/27/2009   Method used:   [facility]        [facility] [phone]* (retail)       [address] [place]  [address]       Ph: [phone] or [phone]       Fax: [phone]   RxID:   [id number]    Orders Added: 1)  Est. Patient Level III [29518]

## 2010-03-23 NOTE — Miscellaneous (Signed)
Summary: [facility] Orders   [facility] Orders   Imported By: Roderic Ovens 04/21/2009 11:21:27  _____________________________________________________________________  External Attachment:    Type:   Image     Comment:   External Document

## 2010-03-23 NOTE — Letter (Signed)
Summary: Colonoscopy Date Change Letter  [facility]  [address] [place], [place] [address]   Phone: [phone]  Fax: [phone]      April 19, 2009 MRN: [id number]   [facility] [address] [place], [place]  [address]   Dear Mr. [facility],   Previously you were recommended to have a repeat colonoscopy around this time. Your chart was recently reviewed by Dr. Judie Petit T. Russella Dar of [facility]. Follow up colonoscopy is now recommended in March 2016. This revised recommendation is based on current, nationally recognized guidelines for colorectal cancer screening and polyp surveillance. These guidelines are endorsed by the American Cancer Society, [employer] on Colorectal Cancer as well as numerous other major medical organizations.  Please understand that our recommendation assumes that you do not have any new symptoms such as bleeding, a change in bowel habits, anemia, or significant abdominal discomfort. If you do have any concerning GI symptoms or want to discuss the guideline recommendations, please call to arrange an office visit at your earliest convenience. Otherwise we will keep you in our reminder system and contact you 1-2 months prior to the date listed above to schedule your next colonoscopy.  Thank you,  Judie Petit T. Russella Dar, M.D.  [facility] [phone]

## 2010-03-23 NOTE — Assessment & Plan Note (Signed)
Summary: rov   Visit Type:  Follow-up Referring Provider:  Arvilla Meres Primary Provider:  Oliver Barre  CC:  sob and fluid.  History of Present Illness: [name] is a very pleasant 77 year old male with  a history of diastolic heart failure as well as hypertension,  hyperlipidemia, diabetes, previous CVA, paroxysmal atrial fibrillation on amiodarone. Not on coumadin as felt to be high risk of bleeding due to h/o ETOH.  Presents today for unscheduled visit. Says his main problem is that he can't sleep and feels like it is his nerves. When he wakes up he notices his feet start burning and hands are itching. Has been going on for about 2 weeks. Only happens at night. Has run out of his Remus Loffler  Also feels slightly more short of breath but not too bad. Doesn't feel that this is the major problem for him. Weight up 5 pounds. Sleeping more in recliner though denies frank orthopnea or PND. No CP. No cough. Taking all medications as scheduled.   Current Medications (verified): 1)  Lipitor 40 Mg  Tabs (Atorvastatin Calcium) .... Take 1/2 Tab By Mouth Once Daily 2)  Amiodarone Hcl 200 Mg  Tabs (Amiodarone Hcl) .... Take One -Half Tablet By Mouth Once A Day (100mg ) 3)  Glimepiride 4 Mg  Tabs (Glimepiride) .... Take 1 Tablet By Mouth Two Times A Day 4)  Allopurinol 100 Mg  Tabs (Allopurinol) .... Take 1 Tablet By Mouth Once A Day 5)  Bayer Aspirin 325 Mg  Tabs (Aspirin) .... Take 1 Tablet By Mouth Once A Day 6)  Klor-Con M20 20 Meq  Tbcr (Potassium Chloride Crys Cr) .... Take 2 Tablet By Mouth Once A Day and Take One Extra Dose When Taking Metolazone. 7)  Amlodipine Besylate 10 Mg Tabs (Amlodipine Besylate) .... Take One Tablet By Mouth Daily 8)  Isosorbide Mononitrate Cr 30 Mg Xr24h-Tab (Isosorbide Mononitrate) .... Take One Tablet By Mouth Daily 9)  Torsemide 20 Mg Tabs (Torsemide) .... 2 Tablets By Mouth  Once Daily 10)  Hydralazine Hcl 25 Mg Tabs (Hydralazine Hcl) .Marland Kitchen.. 1 & 1/2 Tabs By Mouth  Three Times A Day 11)  Januvia 50 Mg Tabs (Sitagliptin Phosphate) .Marland Kitchen.. 1po Once Daily 12)  Omeprazole 20 Mg Tbec (Omeprazole) .Marland Kitchen.. 1 By Mouth Once Daily 13)  Levothroid 112 Mcg Tabs (Levothyroxine Sodium) .... Daily 14)  Metolazone 2.5 Mg Tabs (Metolazone) .... Take 1 Tablet By Mouth Twice A Week 15)  Clonidine Hcl 0.2 Mg Tabs (Clonidine Hcl) .... Two Times A Day 16)  Lantus 100 Unit/ml Soln (Insulin Glargine) .... 53 Units Daily 17)  Colace 100 Mg Caps (Docusate Sodium) .Marland Kitchen.. 1 By Mouth Two Times A Day 18)  Ambien 5 Mg Tabs (Zolpidem Tartrate) .... Take 1 Tablet By Mouth At Bedtime 19)  Bd Pen Needle Short U/f 31g X 8 Mm Misc (Insulin Pen Needle) .... As Directed Patient Needs Office Visit  Allergies (verified): 1)  ! Ace Inhibitors 2)  ! Beta Blockers  Past History:  Past Medical History: Last updated: 10/14/2008 HEART FAILURE (ICD-428.9) secoccndary to Diastolic dysfunction EF     --previous EF 35-45%    --ef 60% 4/09 Atrial fibrillation   --maintained in sinus on amiodarone. thought to be poor coumadin candidate STROKE (ICD-434.91) 03/1999 - chronic dysarthria HYPERLIPIDEMIA (ICD-272.4) HYPERTENSION (ICD-401.9) BENIGN PROSTATIC HYPERTROPHY (ICD-600.00) OSTEOARTHRITIS (ICD-715.90) DIABETES MELLITUS, TYPE II (ICD-250.00) INTOLERANCE TO ACEI/ARBS SECONDARY TO EXTREME HYPOTENSION/WORSENING RENAL FAILURE MOBITZ 1 HEART BLOCK REQUIRING D/C OF BETA BLOCKERS Obstrcutive sleep apnea Obesity  Iron deficiency anemia   --s/p EGD and coloscopy 3/10. gastritis. hemorrhids.  Review of Systems       As per HPI and past medical history; otherwise all systems negative.   Vital Signs:  Patient profile:   77 year old male Height:      70.5 inches Weight:      244 pounds BMI:     34.64 Pulse rate:   60 / minute BP sitting:   166 / 90  (left arm) Cuff size:   regular  Vitals Entered By: Burnett Kanaris, CNA (March 15, 2009 4:01 PM)  Physical Exam  General:  Elderly male. no resp  difficulty. +dysarthria HEENT: normal except for facial droop Neck: supple. JVP 7 Carotids 2+ bilat; no bruits.  Cor: PMI nondisplaced. Mildly irregular. No rubs, gallops. soft systolic murmur across precordium Lungs: clear Abdomen: obese .soft, nontender. No hepatomegaly. No bruits or masses. Good bowel sounds. Extremities: no cyanosis, clubbing, rash. tr edema on R 2 + on left Neuro: alert & orientedx3, cranial nerves grossly intact except for facial droop. moves all 4 extremities w/o difficulty. affect pleasant    Impression & Recommendations:  Problem # 1:  Hands/feet tingling Suspect this may be peripheral neuropathy. Will have him f/u with PCP to see if he woul dbenefit from neurontin.  Problem # 2:  DIASTOLIC HEART FAILURE, ACUTE ON CHRONIC (ICD-428.33) He has mild volume overload. Will add metolazone 2.5 mg per day for next 2 days with an extra dose of kcl 20 per day.   Other Orders: EKG w/ Interpretation (93000) TLB-BMP (Basic Metabolic Panel-BMET) (80048-METABOL) TLB-BNP (B-Natriuretic Peptide) (83880-BNPR) TLB-TSH (Thyroid Stimulating Hormone) (84443-TSH) TLB-B12, Serum-Total ONLY ([address]-U04) TLB-Folic Acid (Folate) (82746-FOL)  Patient Instructions: 1)  Metolazone 2.5mg  for 2 days 2)  Take an extra Potassium for 2 days 3)  Labs today 4)  Keep appointment scheduled for 2/21 at 4:15 with Dr Gala Romney Prescriptions: METOLAZONE 2.5 MG TABS (METOLAZONE) Take as directed  #15 x 3   Entered by:   Meredith Staggers, RN   Authorized by:   Dolores Patty, MD, [facility]   Signed by:   Meredith Staggers, RN on 03/15/2009   Method used:   [facility]        [facility] [phone]* (retail)       [address], [place]  [address]       Ph: [phone] or [phone]       Fax: [phone]   RxID:   [id number] AMBIEN 5 MG TABS (ZOLPIDEM TARTRATE) Take 1 tablet by mouth at bedtime  #30 x 0   Entered by:   Meredith Staggers, RN   Authorized by:   Dolores Patty, MD, [facility]   Signed by:   Meredith Staggers, RN on 03/15/2009   Method used:   Print then Give to Patient   RxID:   [id number]

## 2010-03-23 NOTE — Miscellaneous (Signed)
Summary: Orders/[facility]  Orders/[facility]   Imported By: Lester Tilton 07/23/2009 08:39:38  _____________________________________________________________________  External Attachment:    Type:   Image     Comment:   External Document

## 2010-03-23 NOTE — Assessment & Plan Note (Signed)
Summary: f/u 4 mth   Referring Provider:  Arvilla Meres Primary Provider:  Oliver Barre  CC:  SOB but getting better.  History of Present Illness: [name] is a very pleasant 77 year old male with  a history of diastolic heart failure as well as hypertension,  hyperlipidemia, diabetes, previous CVA, paroxysmal atrial fibrillation on amiodarone. Not on coumadin as felt to be high risk of bleeding due to h/o ETOH.  Returns for routine f/u.  Doing well. Denies any signifcant CP or SOB. Feels he is getting stronger, Edema well controlled. Taking medications as prescribed. No palipitations, syncope or presyncope.  Dyspepsia History:      The patient has a prior history of documented ulcer disease.  A prior EGD has been done.     Current Medications (verified): 1)  Lipitor 40 Mg  Tabs (Atorvastatin Calcium) .... Take 1/2 Tab By Mouth Once Daily 2)  Amiodarone Hcl 200 Mg  Tabs (Amiodarone Hcl) .... Take One -Half Tablet By Mouth Once A Day (100mg ) 3)  Glimepiride 4 Mg  Tabs (Glimepiride) .... Take 1 Tablet By Mouth Two Times A Day 4)  Allopurinol 100 Mg  Tabs (Allopurinol) .... Take 1 Tablet By Mouth Once A Day 5)  Bayer Aspirin 325 Mg  Tabs (Aspirin) .... Take 1 Tablet By Mouth Once A Day 6)  Klor-Con M20 20 Meq  Tbcr (Potassium Chloride Crys Cr) .... Take 2 Tablet By Mouth Once A Day and Take One Extra Dose When Taking Metolazone. 7)  Amlodipine Besylate 10 Mg Tabs (Amlodipine Besylate) .... Take One Tablet By Mouth Daily 8)  Isosorbide Mononitrate Cr 30 Mg Xr24h-Tab (Isosorbide Mononitrate) .... Take One Tablet By Mouth Daily 9)  Torsemide 20 Mg Tabs (Torsemide) .... 2 Tablets By Mouth  Once Daily 10)  Hydralazine Hcl 25 Mg Tabs (Hydralazine Hcl) .Marland Kitchen.. 1 & 1/2 Tabs By Mouth Three Times A Day 11)  Januvia 50 Mg Tabs (Sitagliptin Phosphate) .Marland Kitchen.. 1po Once Daily 12)  Omeprazole 20 Mg Tbec (Omeprazole) .Marland Kitchen.. 1 By Mouth Once Daily 13)  Levothroid 112 Mcg Tabs (Levothyroxine Sodium) ....  Daily 14)  Metolazone 2.5 Mg Tabs (Metolazone) .... Take As Directed 15)  Clonidine Hcl 0.2 Mg Tabs (Clonidine Hcl) .... Two Times A Day 16)  Lantus 100 Unit/ml Soln (Insulin Glargine) .... 53 Units Daily 17)  Colace 100 Mg Caps (Docusate Sodium) .Marland Kitchen.. 1 By Mouth Two Times A Day 18)  Ambien 5 Mg Tabs (Zolpidem Tartrate) .... Take 1 Tablet By Mouth At Bedtime 19)  Bd Pen Needle Short U/f 31g X 8 Mm Misc (Insulin Pen Needle) .... As Directed Patient Needs Office Visit  Allergies (verified): 1)  ! Ace Inhibitors 2)  ! Beta Blockers  Past History:  Past Medical History: Last updated: 10/14/2008 HEART FAILURE (ICD-428.9) secoccndary to Diastolic dysfunction EF     --previous EF 35-45%    --ef 60% 4/09 Atrial fibrillation   --maintained in sinus on amiodarone. thought to be poor coumadin candidate STROKE (ICD-434.91) 03/1999 - chronic dysarthria HYPERLIPIDEMIA (ICD-272.4) HYPERTENSION (ICD-401.9) BENIGN PROSTATIC HYPERTROPHY (ICD-600.00) OSTEOARTHRITIS (ICD-715.90) DIABETES MELLITUS, TYPE II (ICD-250.00) INTOLERANCE TO ACEI/ARBS SECONDARY TO EXTREME HYPOTENSION/WORSENING RENAL FAILURE MOBITZ 1 HEART BLOCK REQUIRING D/C OF BETA BLOCKERS Obstrcutive sleep apnea Obesity Iron deficiency anemia   --s/p EGD and coloscopy 3/10. gastritis. hemorrhids.  Review of Systems       As per HPI and past medical history; otherwise all systems negative.   Vital Signs:  Patient profile:   77 year old male Height:  70.5 inches Weight:      242 pounds BMI:     34.36 Pulse rate:   62 / minute BP sitting:   166 / 62  (left arm) Cuff size:   regular  Vitals Entered By: Hardin Negus, RMA (April 11, 2009 4:16 PM)  Physical Exam  General:  Elderly male. no resp difficulty. +dysarthria HEENT: normal except for facial droop Neck: supple. JVP flat Carotids 2+ bilat; no bruits.  Cor: PMI nondisplaced. Mildly irregular. No rubs, gallops. soft systolic murmur across precordium Lungs:  clear Abdomen: obese .soft, nontender. No hepatomegaly. No bruits or masses. Good bowel sounds. Extremities: no cyanosis, clubbing, rash., no edema Neuro: alert & orientedx3, cranial nerves grossly intact except for facial droop. moves all 4 extremities w/o difficulty. affect pleasant    Impression & Recommendations:  Problem # 1:  CHRONIC DIASTOLIC HEART FAILURE (ICD-428.32) Doing well. Volume status well controlled. Continue current regimen.  Problem # 2:  HYPERTENSION (ICD-401.9) BP is up. will increase hydralzaine to 50 three times a day and continue to follow.  Problem # 3:  ATRIAL FIBRILLATION (ICD-427.31) Maintaining SR on amio. Due for surveillance lans and CXR. Not coumadin candidate. Continue ASA.   Other Orders: EKG w/ Interpretation (93000)  Patient Instructions: 1)  Increase Hydralazine to 50mg  three times a day, you can double up and take 2 tabs three times a day until you run out, then [facility]  [facility]ts  2)  Labs in 1 week--bmet, liver, tsh, free t4, free t3 428.32, 427.31 3)  Chest xray in 1 week--427.31, 428.32 4)  Follow up in 2 months Prescriptions: HYDRALAZINE HCL 50 MG TABS (HYDRALAZINE HCL) Take one tablet by mouth three times a day  #90 x 6   Entered by:   Meredith Staggers, RN   Authorized by:   Dolores Patty, MD, [facility]   Signed by:   Meredith Staggers, RN on 04/11/2009   Method used:   [facility]        [facility] [phone]* (retail)       [address]       [place], [place]  [address]       Ph: [phone] or [phone]       Fax: [phone]   RxID:   [id number]

## 2010-03-23 NOTE — Assessment & Plan Note (Signed)
Summary: 6 month follow up-lb   Vital Signs:  Patient profile:   77 year old male Height:      70.5 inches Weight:      217.38 pounds BMI:     30.86 O2 Sat:      96 % on Room air Temp:     98.4 degrees F oral Pulse rate:   85 / minute BP sitting:   140 / 60  (left arm) Cuff size:   large  Vitals Entered By: Zella Ball Ewing CMA Duncan Dull) (December 06, 2009 4:39 PM)  O2 Flow:  Room air CC: 6 month followup/RE   Primary Care Provider:  Oliver Barre  CC:  6 month followup/RE.  History of Present Illness: here with a male family member who has not been here prevoiusly and is not overly versed on mr Volpe's health;  pt last here 2 mo ago with DM out of control, I asked him to incr the lantus to 30 units and [place] which he states he has done, but still has some polyuria /polydipsia.  Pt denies CP, worsening sob, doe, wheezing, orthopnea, pnd, worsening LE edema, palps, dizziness or syncope  Pt denies new neuro symptoms such as headache, facial or extremity weakness  Pt denies  low sugar symptoms such as shakiness improved with eating.  Overall good compliance with meds (pt states he is), trying to follow low chol, DM diet, wt stable, little excercise however . No fever, wt loss, night sweats, loss of appetite or other constitutional symptoms .  Has checked cbg's rarely , cannot remember specific numbers  Problems Prior to Update: 1)  Atrioventricular Block, 2nd Degree  (ICD-426.13) 2)  Anemia-iron Deficiency  (ICD-280.9) 3)  Renal Insufficiency  (ICD-588.9) 4)  Other Second Degree Atrioventricular Block  (ICD-426.13) 5)  Chronic Systolic Heart Failure  (ICD-428.22) 6)  Peripheral Neuropathy  (ICD-356.9) 7)  Diastolic Heart Failure, Acute On Chronic  (ICD-428.33) 8)  Insomnia  (ICD-780.52) 9)  Unspecified Anemia  (ICD-285.9) 10)  Hypokalemia, Mild  (ICD-276.8) 11)  Other Second Degree Atrioventricular Block  (ICD-426.13) 12)  Colonic Polyps, Hx of  (ICD-V12.72) 13)  Prostate Cancer, Hx of   (ICD-V10.46) 14)  Anxiety  (ICD-300.00) 15)  Peptic Ulcer Disease  (ICD-533.90) 16)  Gout  (ICD-274.9) 17)  Coronary Artery Disease  (ICD-414.00) 18)  Chronic Kidney Disease Stage Iii (MODERATE)  (ICD-585.3) 19)  Alcohol Abuse, Episodic, Hx of  (ICD-V11.3) 20)  Hyperthyroidism  (ICD-242.90) 21)  Preventive Health Care  (ICD-V70.0) 22)  Abnormal Ekg  (ICD-794.31) 23)  Obesity-morbid (>100')  (ICD-278.01) 24)  Long Term Use of Aspirin  (ICD-V58.66) 25)  Long Term High-risk Medication  (ICD-V58.69) 26)  Chronic Diastolic Heart Failure  (ICD-428.32) 27)  Atrial Fibrillation  (ICD-427.31) 28)  Obstructive Sleep Apnea  (ICD-327.23) 29)  Hyperlipidemia  (ICD-272.4) 30)  Hypertension  (ICD-401.9) 31)  Cerebrovascular Accident, Hx of  (ICD-V12.50) 32)  Benign Prostatic Hypertrophy  (ICD-600.00) 33)  Osteoarthritis  (ICD-715.90) 34)  Diabetes Mellitus, Type II, Uncontrolled  (ICD-250.02)  Medications Prior to Update: 1)  Lipitor 40 Mg  Tabs (Atorvastatin Calcium) .... Take 1/2 Tab By Mouth Once Daily 2)  Amiodarone Hcl 200 Mg  Tabs (Amiodarone Hcl) .... Take One -Half Tablet By Mouth Once A Day (100mg ) 3)  Allopurinol 100 Mg  Tabs (Allopurinol) .... Take 1 Tablet By Mouth Once A Day 4)  Bayer Aspirin 325 Mg  Tabs (Aspirin) .... Take 1 Tablet By Mouth Once A Day 5)  Klor-Con M20 20  Meq Cr-Tabs (Potassium Chloride Crys Cr) .Marland Kitchen.. 1 By Mouth Three Times A Day 6)  Imdur 60 Mg Xr24h-Tab (Isosorbide Mononitrate) .Marland Kitchen.. 1 By Mouth Daily 7)  Torsemide 20 Mg Tabs (Torsemide) .Marland Kitchen.. 1 Tablets By Mouth  Two Times A Day 8)  Omeprazole 20 Mg Tbec (Omeprazole) .Marland Kitchen.. 1 By Mouth Once Daily 9)  Levothroid 125 Mcg Tabs (Levothyroxine Sodium) .Marland Kitchen.. 1 By Mouth Daily 10)  Lantus 100 Unit/ml Soln (Insulin Glargine) .... 30 Units Subcutaneously Once Daily 11)  Ambien 5 Mg Tabs (Zolpidem Tartrate) .... Take 1 Tablet By Mouth At Bedtime 12)  Hydralazine Hcl 50 Mg Tabs (Hydralazine Hcl) .Marland Kitchen.. 1 By Mouth Three Times A Day 13)   Spironolactone 25 Mg Tabs (Spironolactone) .Marland Kitchen.. 1 By Mouth Dialy 14)  Citalopram Hydrobromide 10 Mg Tabs (Citalopram Hydrobromide) .Marland Kitchen.. 1 By Mouth Once Daily 15)  Januvia 100 Mg Tabs (Sitagliptin Phosphate) .Marland Kitchen.. 1po Once Daily  Current Medications (verified): 1)  Lipitor 40 Mg  Tabs (Atorvastatin Calcium) .... Take 1/2 Tab By Mouth Once Daily 2)  Amiodarone Hcl 200 Mg  Tabs (Amiodarone Hcl) .... Take One -Half Tablet By Mouth Once A Day (100mg ) 3)  Allopurinol 100 Mg  Tabs (Allopurinol) .... Take 1 Tablet By Mouth Once A Day 4)  Bayer Aspirin 325 Mg  Tabs (Aspirin) .... Take 1 Tablet By Mouth Once A Day 5)  Klor-Con M20 20 Meq Cr-Tabs (Potassium Chloride Crys Cr) .Marland Kitchen.. 1 By Mouth Three Times A Day 6)  Imdur 60 Mg Xr24h-Tab (Isosorbide Mononitrate) .Marland Kitchen.. 1 By Mouth Daily 7)  Torsemide 20 Mg Tabs (Torsemide) .Marland Kitchen.. 1 Tablets By Mouth  Two Times A Day 8)  Omeprazole 20 Mg Tbec (Omeprazole) .Marland Kitchen.. 1 By Mouth Once Daily 9)  Levothroid 125 Mcg Tabs (Levothyroxine Sodium) .Marland Kitchen.. 1 By Mouth Daily 10)  Lantus 100 Unit/ml Soln (Insulin Glargine) .... 30 Units Subcutaneously Once Daily 11)  Ambien 5 Mg Tabs (Zolpidem Tartrate) .... Take 1 Tablet By Mouth At Bedtime 12)  Hydralazine Hcl 50 Mg Tabs (Hydralazine Hcl) .Marland Kitchen.. 1 By Mouth Three Times A Day 13)  Spironolactone 25 Mg Tabs (Spironolactone) .Marland Kitchen.. 1 By Mouth Dialy 14)  Citalopram Hydrobromide 10 Mg Tabs (Citalopram Hydrobromide) .Marland Kitchen.. 1 By Mouth Once Daily 15)  Januvia 100 Mg Tabs (Sitagliptin Phosphate) .Marland Kitchen.. 1po Once Daily  Allergies (verified): 1)  ! Ace Inhibitors 2)  ! Beta Blockers  Past History:  Past Medical History: Last updated: 06/03/2009 HEART FAILURE (ICD-428.9) secoccndary to Diastolic dysfunction EF     --previous EF 35-45%    --ef 60% 4/09 Atrial fibrillation   --maintained in sinus on amiodarone. thought to be poor coumadin candidate STROKE (ICD-434.91) 03/1999 - chronic dysarthria HYPERLIPIDEMIA (ICD-272.4) HYPERTENSION  (ICD-401.9) BENIGN PROSTATIC HYPERTROPHY (ICD-600.00) OSTEOARTHRITIS (ICD-715.90) DIABETES MELLITUS, TYPE II (ICD-250.00) INTOLERANCE TO ACEI/ARBS SECONDARY TO EXTREME HYPOTENSION/WORSENING RENAL FAILURE MOBITZ 1 HEART BLOCK REQUIRING D/C OF BETA BLOCKERS AV block - second degree, not felt to be pacemaker candidate Obstrcutive sleep apnea Obesity Iron deficiency anemia   --s/p EGD and coloscopy 3/10. gastritis. hemorrhids. Cerebrovascular accident, hx of Prostate cancer, hx of Renal insufficiency  Past Surgical History: Last updated: 06/06/2007 Cyst Removal history of prostate surgery  Social History: Last updated: 01/22/2008 Single Divorced  Tobacco Use - No. (HISTORY) Alcohol Use - no (HISTORY OF ABUSE) Regular Exercise - no AMBULATES W/CANE OR WALKER DRUG USE-NO  Risk Factors: Exercise: no (01/22/2008)  Risk Factors: Smoking Status: never (01/22/2008)  Review of Systems       all otherwise  negative per pt -    Physical Exam  General:  alert and overweight-appearing.   Head:  normocephalic and atraumatic.   Eyes:  vision grossly intact, pupils equal, and pupils round.   Ears:  R ear normal and L ear normal.   Nose:  no external deformity and no nasal discharge.   Mouth:  no gingival abnormalities and pharynx pink and moist.   Neck:  supple and no masses.   Lungs:  normal respiratory effort and normal breath sounds.   Heart:  normal rate and regular rhythm.   Extremities:  no edema, no erythema    Impression & Recommendations:  Problem # 1:  DIABETES MELLITUS, TYPE II, UNCONTROLLED (ICD-250.02)  His updated medication list for this problem includes:    Bayer Aspirin 325 Mg Tabs (Aspirin) .Marland Kitchen... Take 1 tablet by mouth once a day    Lantus 100 Unit/ml Soln (Insulin glargine) .Marland KitchenMarland KitchenMarland KitchenMarland Kitchen 30 units subcutaneously once daily    Januvia 100 Mg Tabs (Sitagliptin phosphate) .Marland Kitchen... 1po once daily  Orders: TLB-BMP (Basic Metabolic Panel-BMET) (80048-METABOL) TLB-A1C /  Hgb A1C (Glycohemoglobin) (83036-A1C) TLB-Lipid Panel (80061-LIPID)  Labs Reviewed: Creat: 2.0 (09/19/2009)    Reviewed HgBA1c results: 10.3 (09/19/2009)  7.7 (06/03/2009) unclear how much imrpoved if at all - Pt to cont DM diet, excercise, wt loss efforts; to check labs today ;  to cont same meds for now  Problem # 2:  HYPERTENSION (ICD-401.9)  His updated medication list for this problem includes:    Torsemide 20 Mg Tabs (Torsemide) .Marland Kitchen... 1 tablets by mouth  two times a day    Hydralazine Hcl 50 Mg Tabs (Hydralazine hcl) .Marland Kitchen... 1 by mouth three times a day    Spironolactone 25 Mg Tabs (Spironolactone) .Marland Kitchen... 1 by mouth dialy  BP today: 140/60 Prior BP: 160/68 (09/19/2009)  Labs Reviewed: K+: 4.8 (09/19/2009) Creat: : 2.0 (09/19/2009)   Chol: 149 (09/19/2009)   HDL: 31.20 (09/19/2009)   LDL: 73 (06/03/2009)   TG: 237.0 (09/19/2009) improved - stable overall by hx and exam, ok to continue meds/tx as is   Problem # 3:  HYPERLIPIDEMIA (ICD-272.4)  His updated medication list for this problem includes:    Lipitor 40 Mg Tabs (Atorvastatin calcium) .Marland Kitchen... Take 1/2 tab by mouth once daily  Labs Reviewed: SGOT: 19 (06/03/2009)   SGPT: 18 (06/03/2009)   HDL:31.20 (09/19/2009), 43.40 (06/03/2009)  LDL:73 (06/03/2009), 39 (05/25/2008)  Chol:149 (09/19/2009), 137 (06/03/2009)  Trig:237.0 (09/19/2009), 102.0 (06/03/2009) stable overall by hx and exam, ok to continue meds/tx as is , Pt to continue diet efforts, good med tolerance; to check labs - goal LDL less than 70   Complete Medication List: 1)  Lipitor 40 Mg Tabs (Atorvastatin calcium) .... Take 1/2 tab by mouth once daily 2)  Amiodarone Hcl 200 Mg Tabs (Amiodarone hcl) .... Take one -half tablet by mouth once a day (100mg ) 3)  Allopurinol 100 Mg Tabs (Allopurinol) .... Take 1 tablet by mouth once a day 4)  Bayer Aspirin 325 Mg Tabs (Aspirin) .... Take 1 tablet by mouth once a day 5)  Klor-con M20 20 Meq Cr-tabs (Potassium chloride crys  cr) .Marland Kitchen.. 1 by mouth three times a day 6)  Imdur 60 Mg Xr24h-tab (Isosorbide mononitrate) .Marland Kitchen.. 1 by mouth daily 7)  Torsemide 20 Mg Tabs (Torsemide) .Marland Kitchen.. 1 tablets by mouth  two times a day 8)  Omeprazole 20 Mg Tbec (Omeprazole) .Marland Kitchen.. 1 by mouth once daily 9)  Levothroid 125 Mcg Tabs (Levothyroxine sodium) .Marland Kitchen.. 1 by  mouth daily 10)  Lantus 100 Unit/ml Soln (Insulin glargine) .... 40 units subcutaneously once daily 11)  Ambien 5 Mg Tabs (Zolpidem tartrate) .... Take 1 tablet by mouth at bedtime 12)  Hydralazine Hcl 50 Mg Tabs (Hydralazine hcl) .Marland Kitchen.. 1 by mouth three times a day 13)  Spironolactone 25 Mg Tabs (Spironolactone) .Marland Kitchen.. 1 by mouth dialy 14)  Citalopram Hydrobromide 10 Mg Tabs (Citalopram hydrobromide) .Marland Kitchen.. 1 by mouth once daily 15)  Januvia 100 Mg Tabs (Sitagliptin phosphate) .Marland Kitchen.. 1po once daily  Patient Instructions: 1)  Continue all previous medications as before this visit 2)  Please go to the Lab in the basement for your blood and/or urine tests today 3)  Please call the number on the [facility] Card for results of your testing  4)  Please schedule a follow-up appointment in 6 months with CPX labs  and : 5)  HbgA1C prior to visit, ICD-9: 250.02 6)  Urine Microalbumin prior to visit, ICD-9:   Orders Added: 1)  TLB-BMP (Basic Metabolic Panel-BMET) [80048-METABOL] 2)  TLB-A1C / Hgb A1C (Glycohemoglobin) [83036-A1C] 3)  TLB-Lipid Panel [80061-LIPID] 4)  Est. Patient Level IV [16109]

## 2010-03-23 NOTE — Progress Notes (Signed)
Summary: [facility] Orders  Phone Note Call from Patient   Caller: Daughter [phone] Summary of Call: pt's daughter called requesting an order to [facility] for pt to have a mobility scooter. Initial call taken by: Margaret Pyle, CMA,  May 23, 2009 3:04 PM  Follow-up for Phone Call        RX faxed to [facility] [phone] Follow-up by: Margaret Pyle, CMA,  May 24, 2009 7:58 AM    New/Updated Medications: * MOBILITY SCOOTER use asd Prescriptions: MOBILITY SCOOTER use asd  #1 x 0   Entered and Authorized by:   Corwin Levins MD   Signed by:   Corwin Levins MD on 05/23/2009   Method used:   Print then Give to Patient   RxID:   [id number]  done hardcopy to LIM side B - dahlia  Corwin Levins MD  May 23, 2009 5:38 PM

## 2010-03-23 NOTE — Progress Notes (Signed)
----   Converted from flag ---- ---- 12/07/2009 8:42 AM, Verdell Face wrote: 10/25 appt w/sae/cd    ---- 12/07/2009 8:06 AM, Dagoberto Reef wrote: please schedule with Dr Everardo All.  Thanks  ---- 12/06/2009 8:08 PM, Corwin Levins MD wrote: The following orders have been entered for this patient and placed on Admin Hold:  Type:     Referral       Code:   Endocrine Description:   Endocrinology Referral Order Date:   12/06/2009   Authorized By:   Corwin Levins MD Order #:   [id number] Clinical Notes:   dr Everardo All ------------------------------

## 2010-03-23 NOTE — Assessment & Plan Note (Signed)
Summary: 4 month rov/sl   Visit Type:  Follow-up Referring Provider:  Arvilla Meres Primary Provider:  Oliver Barre  CC:  no complaints.  History of Present Illness: [name] is a very pleasant 77 year old male with  a history of diastolic heart failure as well as 2nd degree heart block (Wenckebach), hypertension,  hyperlipidemia, diabetes, previous CVA, paroxysmal atrial fibrillation on amiodarone. Not on coumadin as felt to be high risk of bleeding due to h/o  and falls  Here with his daughter for routine f/u.  From a cardiac perspective doing very well. Volume status very well controlled. No significant edema, CP or dyspnea. Has hard time walking around a lot due to balance and previous CVA. Taking medicines as prescribed. Now following with Dr. Everardo All for DM2.     Problems Prior to Update: 1)  Atrioventricular Block, 2nd Degree  (ICD-426.13) 2)  Anemia-iron Deficiency  (ICD-280.9) 3)  Renal Insufficiency  (ICD-588.9) 4)  Other Second Degree Atrioventricular Block  (ICD-426.13) 5)  Chronic Systolic Heart Failure  (ICD-428.22) 6)  Peripheral Neuropathy  (ICD-356.9) 7)  Diastolic Heart Failure, Acute On Chronic  (ICD-428.33) 8)  Insomnia  (ICD-780.52) 9)  Unspecified Anemia  (ICD-285.9) 10)  Hypokalemia, Mild  (ICD-276.8) 11)  Other Second Degree Atrioventricular Block  (ICD-426.13) 12)  Colonic Polyps, Hx of  (ICD-V12.72) 13)  Prostate Cancer, Hx of  (ICD-V10.46) 14)  Anxiety  (ICD-300.00) 15)  Peptic Ulcer Disease  (ICD-533.90) 16)  Gout  (ICD-274.9) 17)  Coronary Artery Disease  (ICD-414.00) 18)  Chronic Kidney Disease Stage Iii (MODERATE)  (ICD-585.3) 19)  Alcohol Abuse, Episodic, Hx of  (ICD-V11.3) 20)  Hyperthyroidism  (ICD-242.90) 21)  Preventive Health Care  (ICD-V70.0) 22)  Abnormal Ekg  (ICD-794.31) 23)  Obesity-morbid (>100')  (ICD-278.01) 24)  Long Term Use of Aspirin  (ICD-V58.66) 25)  Long Term High-risk Medication  (ICD-V58.69) 26)  Chronic Diastolic  Heart Failure  (ICD-428.32) 27)  Atrial Fibrillation  (ICD-427.31) 28)  Obstructive Sleep Apnea  (ICD-327.23) 29)  Hyperlipidemia  (ICD-272.4) 30)  Hypertension  (ICD-401.9) 31)  Cerebrovascular Accident, Hx of  (ICD-V12.50) 32)  Benign Prostatic Hypertrophy  (ICD-600.00) 33)  Osteoarthritis  (ICD-715.90) 34)  Diabetes Mellitus, Type II, Uncontrolled  (ICD-250.02)  Current Medications (verified): 1)  Lipitor 40 Mg  Tabs (Atorvastatin Calcium) .... Take 1/2 Tab By Mouth Once Daily 2)  Amiodarone Hcl 200 Mg  Tabs (Amiodarone Hcl) .... Take One -Half Tablet By Mouth Once A Day (100mg ) 3)  Allopurinol 100 Mg  Tabs (Allopurinol) .... Take 1 Tablet By Mouth Once A Day 4)  Bayer Aspirin 325 Mg  Tabs (Aspirin) .... Take 1 Tablet By Mouth Once A Day 5)  Klor-Con M20 20 Meq Cr-Tabs (Potassium Chloride Crys Cr) .Marland Kitchen.. 1 By Mouth Three Times A Day 6)  Imdur 60 Mg Xr24h-Tab (Isosorbide Mononitrate) .Marland Kitchen.. 1 By Mouth Daily 7)  Torsemide 20 Mg Tabs (Torsemide) .Marland Kitchen.. 1 Tablets By Mouth  Two Times A Day 8)  Omeprazole 20 Mg Tbec (Omeprazole) .Marland Kitchen.. 1 By Mouth Once Daily 9)  Levothroid 125 Mcg Tabs (Levothyroxine Sodium) .Marland Kitchen.. 1 By Mouth Daily 10)  Ambien 5 Mg Tabs (Zolpidem Tartrate) .... Take 1 Tablet By Mouth At Bedtime 11)  Hydralazine Hcl 50 Mg Tabs (Hydralazine Hcl) .Marland Kitchen.. 1 By Mouth Three Times A Day 12)  Spironolactone 25 Mg Tabs (Spironolactone) .Marland Kitchen.. 1 By Mouth Dialy 13)  Citalopram Hydrobromide 10 Mg Tabs (Citalopram Hydrobromide) .Marland Kitchen.. 1 By Mouth Once Daily 14)  Levemir Flexpen 100 Unit/ml Soln (  Insulin Detemir) .... 55 Units Each Am  Allergies (verified): 1)  ! Ace Inhibitors 2)  ! Beta Blockers  Past History:  Past Medical History: Last updated: 06/03/2009 HEART FAILURE (ICD-428.9) secoccndary to Diastolic dysfunction EF     --previous EF 35-45%    --ef 60% 4/09 Atrial fibrillation   --maintained in sinus on amiodarone. thought to be poor coumadin candidate STROKE (ICD-434.91) 03/1999 - chronic  dysarthria HYPERLIPIDEMIA (ICD-272.4) HYPERTENSION (ICD-401.9) BENIGN PROSTATIC HYPERTROPHY (ICD-600.00) OSTEOARTHRITIS (ICD-715.90) DIABETES MELLITUS, TYPE II (ICD-250.00) INTOLERANCE TO ACEI/ARBS SECONDARY TO EXTREME HYPOTENSION/WORSENING RENAL FAILURE MOBITZ 1 HEART BLOCK REQUIRING D/C OF BETA BLOCKERS AV block - second degree, not felt to be pacemaker candidate Obstrcutive sleep apnea Obesity Iron deficiency anemia   --s/p EGD and coloscopy 3/10. gastritis. hemorrhids. Cerebrovascular accident, hx of Prostate cancer, hx of Renal insufficiency  Past Surgical History: Last updated: 06/06/2007 Cyst Removal history of prostate surgery  Family History: Last updated: 12/13/2009 Father - heart disease Family History of Hypertension:  no dm  Social History: Last updated: 12/13/2009 Single Divorced  Tobacco Use - No. (HISTORY) Alcohol Use - no (HISTORY OF ABUSE) Regular Exercise - no AMBULATES W/CANE OR WALKER DRUG USE-NO retired  Risk Factors: Exercise: no (01/22/2008)  Risk Factors: Smoking Status: never (01/22/2008)  Review of Systems       As per HPI and past medical history; otherwise all systems negative.   Vital Signs:  Patient profile:   77 year old male Height:      70.5 inches Weight:      223.25 pounds BMI:     31.69 Pulse rate:   80 / minute BP sitting:   152 / 73  (left arm) Cuff size:   regular  Vitals Entered By: Haze Boyden, CMA (January 18, 2010 2:19 PM)  Physical Exam  General:  Elderly male. no resp difficulty. +dysarthria. walks with cane HEENT: normal except for facial droop Neck: supple. JVP flat  Carotids 2+ bilat; no bruits.  Cor: PMI nondisplaced. Mildly irregular. No rubs, gallops. soft systolic murmur across precordium Lungs: clear Abdomen: obese .soft, nontender. No hepatomegaly. No bruits or masses. Good bowel sounds. Extremities: no cyanosis, clubbing, rash, no edema Neuro: alert & orientedx3, cranial nerves grossly  intact except for facial droop. moves all 4 extremities w/o difficulty. affect pleasant   Impression & Recommendations:  Problem # 1:  CHRONIC SYSTOLIC HEART FAILURE (ICD-428.22) Doing well. Volume status looks great. Continue current regimen.   Problem # 2:  ATRIAL FIBRILLATION (ICD-427.31) Maintaing SR on low dose amio. LFTs ok over the summer. Will recheck TFTs and LFTs at next visit. Felt to be poor coumadin candidate despite high stroke risk. Continue ASA.  Patient Instructions: 1)  Follow up in 3 months.

## 2010-03-23 NOTE — Miscellaneous (Signed)
Summary: Plan/[facility]  Plan/[facility]   Imported By: Lester Easton 08/05/2009 08:24:59  _____________________________________________________________________  External Attachment:    Type:   Image     Comment:   External Document

## 2010-03-24 NOTE — Miscellaneous (Signed)
Summary: Order/[facility]  Order/[facility]   Imported By: Lester Oberlin 07/07/2009 13:13:46  _____________________________________________________________________  External Attachment:    Type:   Image     Comment:   External Document

## 2010-04-04 ENCOUNTER — Ambulatory Visit: Payer: Self-pay | Admitting: Internal Medicine

## 2010-04-05 ENCOUNTER — Ambulatory Visit: Payer: Self-pay | Admitting: Internal Medicine

## 2010-04-27 ENCOUNTER — Encounter (INDEPENDENT_AMBULATORY_CARE_PROVIDER_SITE_OTHER): Payer: Self-pay | Admitting: *Deleted

## 2010-04-28 ENCOUNTER — Encounter: Payer: Self-pay | Admitting: Internal Medicine

## 2010-05-02 NOTE — Letter (Signed)
Summary: Appointment - Reschedule  [facility], Main Office  [address]   [place], [place] [address]   Phone: [phone]  Fax: [phone]     April 27, 2010 MRN: [id number]   [facility] [address] [place], [place]  [address]   Dear Mr. [facility],   Due to a change in our office schedule, your appointment on  March 21,2012 at 3:00 must be changed.  It is very important that we reach you to reschedule this appointment. We look forward to participating in your health care needs. Please contact us at the number listed above at your earliest convenience to reschedule this appointment.     Sincerely, Control and instrumentation engineer

## 2010-05-08 LAB — DIFFERENTIAL
Basophils Absolute: 0 10*3/uL (ref 0.0–0.1)
Basophils Relative: 0 % (ref 0–1)
Eosinophils Absolute: 0.2 10*3/uL (ref 0.0–0.7)
Eosinophils Absolute: 0.3 10*3/uL (ref 0.0–0.7)
Lymphocytes Relative: 14 % (ref 12–46)
Lymphs Abs: 0.7 10*3/uL (ref 0.7–4.0)
Lymphs Abs: 1 10*3/uL (ref 0.7–4.0)
Monocytes Absolute: 0.7 10*3/uL (ref 0.1–1.0)
Neutro Abs: 5.8 10*3/uL (ref 1.7–7.7)
Neutrophils Relative %: 72 % (ref 43–77)
Neutrophils Relative %: 78 % — ABNORMAL HIGH (ref 43–77)

## 2010-05-08 LAB — GLUCOSE, CAPILLARY
Glucose-Capillary: 132 mg/dL — ABNORMAL HIGH (ref 70–99)
Glucose-Capillary: 183 mg/dL — ABNORMAL HIGH (ref 70–99)
Glucose-Capillary: 184 mg/dL — ABNORMAL HIGH (ref 70–99)
Glucose-Capillary: 233 mg/dL — ABNORMAL HIGH (ref 70–99)
Glucose-Capillary: 247 mg/dL — ABNORMAL HIGH (ref 70–99)
Glucose-Capillary: 263 mg/dL — ABNORMAL HIGH (ref 70–99)
Glucose-Capillary: 274 mg/dL — ABNORMAL HIGH (ref 70–99)
Glucose-Capillary: 288 mg/dL — ABNORMAL HIGH (ref 70–99)
Glucose-Capillary: 297 mg/dL — ABNORMAL HIGH (ref 70–99)
Glucose-Capillary: 298 mg/dL — ABNORMAL HIGH (ref 70–99)
Glucose-Capillary: 57 mg/dL — ABNORMAL LOW (ref 70–99)
Glucose-Capillary: 66 mg/dL — ABNORMAL LOW (ref 70–99)
Glucose-Capillary: 86 mg/dL (ref 70–99)

## 2010-05-08 LAB — BASIC METABOLIC PANEL
BUN: 24 mg/dL — ABNORMAL HIGH (ref 6–23)
CO2: 26 mEq/L (ref 19–32)
CO2: 26 mEq/L (ref 19–32)
Chloride: 106 mEq/L (ref 96–112)
Chloride: 107 mEq/L (ref 96–112)
Creatinine, Ser: 1.71 mg/dL — ABNORMAL HIGH (ref 0.4–1.5)
Creatinine, Ser: 1.96 mg/dL — ABNORMAL HIGH (ref 0.4–1.5)
GFR calc Af Amer: 41 mL/min — ABNORMAL LOW (ref >60)
GFR calc Af Amer: 55 mL/min — ABNORMAL LOW (ref >60)
GFR calc non Af Amer: 39 mL/min — ABNORMAL LOW (ref >60)
Glucose, Bld: 280 mg/dL — ABNORMAL HIGH (ref 70–99)
Potassium: 3.9 mEq/L (ref 3.5–5.1)
Potassium: 4.3 mEq/L (ref 3.5–5.1)
Sodium: 139 mEq/L (ref 135–145)

## 2010-05-08 LAB — CBC
HCT: 31.5 % — ABNORMAL LOW (ref 39.0–52.0)
HCT: 32.7 % — ABNORMAL LOW (ref 39.0–52.0)
Hemoglobin: 10 g/dL — ABNORMAL LOW (ref 13.0–17.0)
Hemoglobin: 10.1 g/dL — ABNORMAL LOW (ref 13.0–17.0)
MCHC: 30.1 g/dL (ref 30.0–36.0)
MCHC: 30.5 g/dL (ref 30.0–36.0)
MCV: 71.9 fL — ABNORMAL LOW (ref 78.0–100.0)
MCV: 71.9 fL — ABNORMAL LOW (ref 78.0–100.0)
Platelets: 342 10*3/uL (ref 150–400)
RBC: 4.54 MIL/uL (ref 4.22–5.81)
RBC: 4.67 MIL/uL (ref 4.22–5.81)
RDW: 20.7 % — ABNORMAL HIGH (ref 11.5–15.5)
WBC: 8.4 10*3/uL (ref 4.0–10.5)

## 2010-05-08 LAB — COMPREHENSIVE METABOLIC PANEL
ALT: 18 U/L (ref 0–53)
CO2: 24 mEq/L (ref 19–32)
Calcium: 8.8 mg/dL (ref 8.4–10.5)
Chloride: 108 mEq/L (ref 96–112)
Creatinine, Ser: 1.68 mg/dL — ABNORMAL HIGH (ref 0.4–1.5)
GFR calc non Af Amer: 40 mL/min — ABNORMAL LOW (ref >60)
Glucose, Bld: 164 mg/dL — ABNORMAL HIGH (ref 70–99)
Total Bilirubin: 0.2 mg/dL — ABNORMAL LOW (ref 0.3–1.2)

## 2010-05-08 LAB — HEMOGLOBIN A1C
Hgb A1c MFr Bld: 6.8 % — ABNORMAL HIGH (ref <5.7)
Mean Plasma Glucose: 148 mg/dL — ABNORMAL HIGH (ref <117)

## 2010-05-08 LAB — URINE CULTURE

## 2010-05-08 LAB — URINALYSIS, ROUTINE W REFLEX MICROSCOPIC
Ketones, ur: NEGATIVE mg/dL
Leukocytes, UA: NEGATIVE
Nitrite: NEGATIVE
Specific Gravity, Urine: 1.014 (ref 1.005–1.030)
Urobilinogen, UA: 0.2 mg/dL (ref 0.0–1.0)
pH: 5.5 (ref 5.0–8.0)

## 2010-05-08 LAB — POCT CARDIAC MARKERS: Troponin i, poc: <0.05 ng/mL (ref 0.00–0.09)

## 2010-05-08 LAB — URINE MICROSCOPIC-ADD ON

## 2010-05-09 LAB — GLUCOSE, CAPILLARY
Glucose-Capillary: 114 mg/dL — ABNORMAL HIGH (ref 70–99)
Glucose-Capillary: 135 mg/dL — ABNORMAL HIGH (ref 70–99)
Glucose-Capillary: 166 mg/dL — ABNORMAL HIGH (ref 70–99)
Glucose-Capillary: 168 mg/dL — ABNORMAL HIGH (ref 70–99)
Glucose-Capillary: 171 mg/dL — ABNORMAL HIGH (ref 70–99)
Glucose-Capillary: 191 mg/dL — ABNORMAL HIGH (ref 70–99)
Glucose-Capillary: 212 mg/dL — ABNORMAL HIGH (ref 70–99)
Glucose-Capillary: 220 mg/dL — ABNORMAL HIGH (ref 70–99)
Glucose-Capillary: 223 mg/dL — ABNORMAL HIGH (ref 70–99)
Glucose-Capillary: 236 mg/dL — ABNORMAL HIGH (ref 70–99)
Glucose-Capillary: 241 mg/dL — ABNORMAL HIGH (ref 70–99)
Glucose-Capillary: 304 mg/dL — ABNORMAL HIGH (ref 70–99)
Glucose-Capillary: 313 mg/dL — ABNORMAL HIGH (ref 70–99)
Glucose-Capillary: 35 mg/dL — CL (ref 70–99)
Glucose-Capillary: 47 mg/dL — ABNORMAL LOW (ref 70–99)
Glucose-Capillary: 58 mg/dL — ABNORMAL LOW (ref 70–99)
Glucose-Capillary: 78 mg/dL (ref 70–99)
Glucose-Capillary: 79 mg/dL (ref 70–99)
Glucose-Capillary: 84 mg/dL (ref 70–99)

## 2010-05-09 LAB — CBC
HCT: 31.3 % — ABNORMAL LOW (ref 39.0–52.0)
HCT: 33.3 % — ABNORMAL LOW (ref 39.0–52.0)
Hemoglobin: 10 g/dL — ABNORMAL LOW (ref 13.0–17.0)
Hemoglobin: 9.8 g/dL — ABNORMAL LOW (ref 13.0–17.0)
MCHC: 30.1 g/dL (ref 30.0–36.0)
MCHC: 31.2 g/dL (ref 30.0–36.0)
MCHC: 31.8 g/dL (ref 30.0–36.0)
MCV: 70.4 fL — ABNORMAL LOW (ref 78.0–100.0)
MCV: 71.8 fL — ABNORMAL LOW (ref 78.0–100.0)
Platelets: 318 10*3/uL (ref 150–400)
Platelets: 353 10*3/uL (ref 150–400)
RBC: 4.34 MIL/uL (ref 4.22–5.81)
RBC: 4.36 MIL/uL (ref 4.22–5.81)
RDW: 21.5 % — ABNORMAL HIGH (ref 11.5–15.5)
RDW: 21.5 % — ABNORMAL HIGH (ref 11.5–15.5)
WBC: 9.5 10*3/uL (ref 4.0–10.5)
WBC: 9.8 10*3/uL (ref 4.0–10.5)

## 2010-05-09 LAB — BASIC METABOLIC PANEL
BUN: 18 mg/dL (ref 6–23)
BUN: 20 mg/dL (ref 6–23)
BUN: 25 mg/dL — ABNORMAL HIGH (ref 6–23)
CO2: 23 mEq/L (ref 19–32)
CO2: 24 mEq/L (ref 19–32)
CO2: 27 mEq/L (ref 19–32)
CO2: 28 mEq/L (ref 19–32)
CO2: 30 mEq/L (ref 19–32)
Calcium: 8.2 mg/dL — ABNORMAL LOW (ref 8.4–10.5)
Calcium: 8.9 mg/dL (ref 8.4–10.5)
Calcium: 9 mg/dL (ref 8.4–10.5)
Calcium: 9 mg/dL (ref 8.4–10.5)
Calcium: 9.1 mg/dL (ref 8.4–10.5)
Calcium: 9.2 mg/dL (ref 8.4–10.5)
Chloride: 106 mEq/L (ref 96–112)
Chloride: 106 mEq/L (ref 96–112)
Chloride: 106 mEq/L (ref 96–112)
Chloride: 107 mEq/L (ref 96–112)
Chloride: 115 mEq/L — ABNORMAL HIGH (ref 96–112)
Creatinine, Ser: 1.41 mg/dL (ref 0.4–1.5)
Creatinine, Ser: 1.57 mg/dL — ABNORMAL HIGH (ref 0.4–1.5)
Creatinine, Ser: 1.57 mg/dL — ABNORMAL HIGH (ref 0.4–1.5)
Creatinine, Ser: 1.62 mg/dL — ABNORMAL HIGH (ref 0.4–1.5)
Creatinine, Ser: 1.7 mg/dL — ABNORMAL HIGH (ref 0.4–1.5)
Creatinine, Ser: 1.74 mg/dL — ABNORMAL HIGH (ref 0.4–1.5)
GFR calc Af Amer: 47 mL/min — ABNORMAL LOW (ref >60)
GFR calc Af Amer: 51 mL/min — ABNORMAL LOW (ref >60)
GFR calc Af Amer: 53 mL/min — ABNORMAL LOW (ref >60)
GFR calc Af Amer: 59 mL/min — ABNORMAL LOW (ref >60)
GFR calc non Af Amer: 40 mL/min — ABNORMAL LOW (ref >60)
GFR calc non Af Amer: 42 mL/min — ABNORMAL LOW (ref >60)
GFR calc non Af Amer: 48 mL/min — ABNORMAL LOW (ref >60)
Glucose, Bld: 101 mg/dL — ABNORMAL HIGH (ref 70–99)
Glucose, Bld: 189 mg/dL — ABNORMAL HIGH (ref 70–99)
Glucose, Bld: 216 mg/dL — ABNORMAL HIGH (ref 70–99)
Glucose, Bld: 219 mg/dL — ABNORMAL HIGH (ref 70–99)
Potassium: 4.1 mEq/L (ref 3.5–5.1)
Sodium: 141 mEq/L (ref 135–145)

## 2010-05-09 LAB — PROTIME-INR
INR: 1.02 (ref 0.00–1.49)
INR: 1.17 (ref 0.00–1.49)
INR: 1.2 (ref 0.00–1.49)
INR: 1.22 (ref 0.00–1.49)
INR: 1.39 (ref 0.00–1.49)
Prothrombin Time: 14.8 seconds (ref 11.6–15.2)
Prothrombin Time: 15.3 seconds — ABNORMAL HIGH (ref 11.6–15.2)

## 2010-05-09 LAB — CARDIAC PANEL(CRET KIN+CKTOT+MB+TROPI)
CK, MB: 5.9 ng/mL — ABNORMAL HIGH (ref 0.3–4.0)
Relative Index: 3.9 — ABNORMAL HIGH (ref 0.0–2.5)
Relative Index: 4.8 — ABNORMAL HIGH (ref 0.0–2.5)
Total CK: 138 U/L (ref 7–232)
Total CK: 70 U/L (ref 7–232)
Troponin I: 0.12 ng/mL — ABNORMAL HIGH (ref 0.00–0.06)

## 2010-05-09 LAB — TSH: TSH: 5.465 u[IU]/mL — ABNORMAL HIGH (ref 0.350–4.500)

## 2010-05-09 LAB — POCT CARDIAC MARKERS
CKMB, poc: 5.3 ng/mL (ref 1.0–8.0)
Myoglobin, poc: 217 ng/mL (ref 12–200)

## 2010-05-09 LAB — LIPID PANEL
Total CHOL/HDL Ratio: 2.9 RATIO
Triglycerides: 108 mg/dL (ref <150)
VLDL: 22 mg/dL (ref 0–40)

## 2010-05-09 LAB — DIFFERENTIAL
Basophils Absolute: 0 10*3/uL (ref 0.0–0.1)
Basophils Relative: 0 % (ref 0–1)
Eosinophils Absolute: 0.1 10*3/uL (ref 0.0–0.7)
Neutro Abs: 8.4 10*3/uL — ABNORMAL HIGH (ref 1.7–7.7)
Neutrophils Relative %: 86 % — ABNORMAL HIGH (ref 43–77)

## 2010-05-09 LAB — BRAIN NATRIURETIC PEPTIDE: Pro B Natriuretic peptide (BNP): 789 pg/mL — ABNORMAL HIGH (ref 0.0–100.0)

## 2010-05-10 ENCOUNTER — Ambulatory Visit: Payer: Self-pay | Admitting: Internal Medicine

## 2010-05-10 LAB — CBC
HCT: 31.7 % — ABNORMAL LOW (ref 39.0–52.0)
Hemoglobin: 9.7 g/dL — ABNORMAL LOW (ref 13.0–17.0)
MCV: 71.3 fL — ABNORMAL LOW (ref 78.0–100.0)
RDW: 24.3 % — ABNORMAL HIGH (ref 11.5–15.5)
WBC: 8.8 10*3/uL (ref 4.0–10.5)

## 2010-05-10 LAB — BASIC METABOLIC PANEL
Chloride: 115 mEq/L — ABNORMAL HIGH (ref 96–112)
GFR calc non Af Amer: 51 mL/min — ABNORMAL LOW (ref >60)
Glucose, Bld: 89 mg/dL (ref 70–99)
Potassium: 4.4 mEq/L (ref 3.5–5.1)
Sodium: 141 mEq/L (ref 135–145)

## 2010-05-10 LAB — CK TOTAL AND CKMB (NOT AT ARMC): Relative Index: 3.9 — ABNORMAL HIGH (ref 0.0–2.5)

## 2010-05-10 LAB — TROPONIN I: Troponin I: 0.07 ng/mL — ABNORMAL HIGH (ref 0.00–0.06)

## 2010-05-10 LAB — BRAIN NATRIURETIC PEPTIDE: Pro B Natriuretic peptide (BNP): 524 pg/mL — ABNORMAL HIGH (ref 0.0–100.0)

## 2010-05-15 ENCOUNTER — Other Ambulatory Visit: Payer: Self-pay | Admitting: Internal Medicine

## 2010-05-15 LAB — BASIC METABOLIC PANEL
BUN: 22 mg/dL (ref 6–23)
BUN: 27 mg/dL — ABNORMAL HIGH (ref 6–23)
BUN: 37 mg/dL — ABNORMAL HIGH (ref 6–23)
CO2: 30 mEq/L (ref 19–32)
CO2: 31 mEq/L (ref 19–32)
Calcium: 8.3 mg/dL — ABNORMAL LOW (ref 8.4–10.5)
Calcium: 8.4 mg/dL (ref 8.4–10.5)
Calcium: 8.5 mg/dL (ref 8.4–10.5)
Chloride: 104 mEq/L (ref 96–112)
Chloride: 96 mEq/L (ref 96–112)
Chloride: 99 mEq/L (ref 96–112)
Creatinine, Ser: 1.78 mg/dL — ABNORMAL HIGH (ref 0.4–1.5)
Creatinine, Ser: 1.88 mg/dL — ABNORMAL HIGH (ref 0.4–1.5)
Creatinine, Ser: 1.96 mg/dL — ABNORMAL HIGH (ref 0.4–1.5)
GFR calc Af Amer: 41 mL/min — ABNORMAL LOW (ref >60)
GFR calc Af Amer: 43 mL/min — ABNORMAL LOW (ref >60)
GFR calc Af Amer: 47 mL/min — ABNORMAL LOW (ref >60)
GFR calc non Af Amer: 38 mL/min — ABNORMAL LOW (ref >60)
GFR calc non Af Amer: 39 mL/min — ABNORMAL LOW (ref >60)
Glucose, Bld: 178 mg/dL — ABNORMAL HIGH (ref 70–99)
Glucose, Bld: 194 mg/dL — ABNORMAL HIGH (ref 70–99)
Glucose, Bld: 198 mg/dL — ABNORMAL HIGH (ref 70–99)
Glucose, Bld: 241 mg/dL — ABNORMAL HIGH (ref 70–99)
Glucose, Bld: 278 mg/dL — ABNORMAL HIGH (ref 70–99)
Potassium: 3.5 mEq/L (ref 3.5–5.1)
Potassium: 3.8 mEq/L (ref 3.5–5.1)
Sodium: 136 mEq/L (ref 135–145)
Sodium: 142 mEq/L (ref 135–145)

## 2010-05-15 LAB — GLUCOSE, RANDOM: Glucose, Bld: 370 mg/dL — ABNORMAL HIGH (ref 70–99)

## 2010-05-15 LAB — GLUCOSE, CAPILLARY
Glucose-Capillary: 210 mg/dL — ABNORMAL HIGH (ref 70–99)
Glucose-Capillary: 224 mg/dL — ABNORMAL HIGH (ref 70–99)
Glucose-Capillary: 282 mg/dL — ABNORMAL HIGH (ref 70–99)

## 2010-05-15 LAB — DIFFERENTIAL
Basophils Relative: 1 % (ref 0–1)
Eosinophils Absolute: 0.2 10*3/uL (ref 0.0–0.7)
Eosinophils Relative: 2 % (ref 0–5)
Lymphocytes Relative: 8 % — ABNORMAL LOW (ref 12–46)
Monocytes Absolute: 0.6 10*3/uL (ref 0.1–1.0)
Neutro Abs: 7.9 10*3/uL — ABNORMAL HIGH (ref 1.7–7.7)
Neutrophils Relative %: 83 % — ABNORMAL HIGH (ref 43–77)

## 2010-05-15 LAB — CBC
HCT: 30.6 % — ABNORMAL LOW (ref 39.0–52.0)
Hemoglobin: 9.6 g/dL — ABNORMAL LOW (ref 13.0–17.0)
Hemoglobin: 9.6 g/dL — ABNORMAL LOW (ref 13.0–17.0)
Hemoglobin: 9.8 g/dL — ABNORMAL LOW (ref 13.0–17.0)
MCHC: 30 g/dL (ref 30.0–36.0)
MCHC: 31.4 g/dL (ref 30.0–36.0)
MCV: 70.1 fL — ABNORMAL LOW (ref 78.0–100.0)
RBC: 4.39 MIL/uL (ref 4.22–5.81)
RBC: 4.39 MIL/uL (ref 4.22–5.81)
RBC: 4.67 MIL/uL (ref 4.22–5.81)
RDW: 20.6 % — ABNORMAL HIGH (ref 11.5–15.5)
RDW: 21.2 % — ABNORMAL HIGH (ref 11.5–15.5)
RDW: 21.7 % — ABNORMAL HIGH (ref 11.5–15.5)
WBC: 10.8 10*3/uL — ABNORMAL HIGH (ref 4.0–10.5)

## 2010-05-15 LAB — POCT I-STAT, CHEM 8
BUN: 21 mg/dL (ref 6–23)
Creatinine, Ser: 1.8 mg/dL — ABNORMAL HIGH (ref 0.4–1.5)
Glucose, Bld: 226 mg/dL — ABNORMAL HIGH (ref 70–99)
Potassium: 3.2 mEq/L — ABNORMAL LOW (ref 3.5–5.1)
Sodium: 142 mEq/L (ref 135–145)

## 2010-05-15 LAB — CARDIAC PANEL(CRET KIN+CKTOT+MB+TROPI)
CK, MB: 2.7 ng/mL (ref 0.3–4.0)
CK, MB: 2.7 ng/mL (ref 0.3–4.0)
CK, MB: 3.1 ng/mL (ref 0.3–4.0)
CK, MB: 3.5 ng/mL (ref 0.3–4.0)
Relative Index: 1.9 (ref 0.0–2.5)
Relative Index: 2.5 (ref 0.0–2.5)
Total CK: 142 U/L (ref 7–232)
Total CK: 145 U/L (ref 7–232)
Troponin I: 0.07 ng/mL — ABNORMAL HIGH (ref 0.00–0.06)
Troponin I: 0.08 ng/mL — ABNORMAL HIGH (ref 0.00–0.06)
Troponin I: 0.09 ng/mL — ABNORMAL HIGH (ref 0.00–0.06)

## 2010-05-15 LAB — LIPID PANEL
HDL: 31 mg/dL — ABNORMAL LOW (ref >39)
Total CHOL/HDL Ratio: 3.4 RATIO
VLDL: 22 mg/dL (ref 0–40)

## 2010-05-15 LAB — CK TOTAL AND CKMB (NOT AT ARMC)
CK, MB: 2.3 ng/mL (ref 0.3–4.0)
Relative Index: 1.6 (ref 0.0–2.5)
Total CK: 143 U/L (ref 7–232)

## 2010-05-15 LAB — HEMOGLOBIN A1C
Hgb A1c MFr Bld: 9.7 % — ABNORMAL HIGH (ref 4.6–6.1)
Mean Plasma Glucose: 232 mg/dL

## 2010-05-15 LAB — MAGNESIUM: Magnesium: 2.1 mg/dL (ref 1.5–2.5)

## 2010-05-15 LAB — POCT CARDIAC MARKERS
CKMB, poc: 1.9 ng/mL (ref 1.0–8.0)
Myoglobin, poc: 195 ng/mL (ref 12–200)

## 2010-05-15 LAB — TSH: TSH: 4.238 u[IU]/mL (ref 0.350–4.500)

## 2010-05-15 NOTE — Telephone Encounter (Signed)
To robin   

## 2010-05-17 ENCOUNTER — Telehealth: Payer: Self-pay | Admitting: Physician Assistant

## 2010-05-17 MED ORDER — INSULIN PEN NEEDLE 31G X 8 MM MISC: 1.0000 | Freq: Every day | Status: DC

## 2010-05-17 NOTE — Telephone Encounter (Signed)
Pt's POA called this evening to request refill of insulin medicine. Pt usually sees Oliver Barre. Advised her to call pt's primary care as we do not typically manage diabetic medication/supplies. She expressed gratitude and understanding.

## 2010-05-18 ENCOUNTER — Telehealth: Payer: Self-pay

## 2010-05-18 NOTE — Telephone Encounter (Signed)
[facility] Triage Call Report Triage Record Num: [id number] Operator: Remonia Richter Patient Name: [name] Call Date & Time: 05/17/2010 5:20:53PM Patient Phone: [phone] PCP: Oliver Barre Patient Gender: Male PCP Fax : [phone] Patient DOB: 1932/12/22 Practice Name: [facility] Reason for Call: [name]/Daughter: needs refill on insulin pen and needle at [facility] at [phone], pharmacy called and given refill for needles to [facility] the pharmacist who stated they were already filled and ready, [name] goven information Protocol(s) Used: Medication Question Calls, No Triage (Adults) Recommended Outcome per Protocol: Provide Information or Advice Only Reason for Outcome: Caller requesting a refill, no triage required and triager able to refill per unit policy Care Advice: ~ 05/17/2010 5:30:09PM Page 1 of 1 [email or web address]

## 2010-05-22 ENCOUNTER — Other Ambulatory Visit: Payer: Self-pay

## 2010-05-22 MED ORDER — SPIRONOLACTONE 25 MG PO TABS: 25.0000 mg | ORAL_TABLET | Freq: Every day | ORAL | Status: DC

## 2010-05-22 MED ORDER — ISOSORBIDE MONONITRATE ER 60 MG PO TB24: 60.0000 mg | ORAL_TABLET | Freq: Every day | ORAL | Status: DC

## 2010-05-22 MED ORDER — CITALOPRAM HYDROBROMIDE 10 MG PO TABS: 10.0000 mg | ORAL_TABLET | Freq: Every day | ORAL | Status: DC

## 2010-05-29 ENCOUNTER — Other Ambulatory Visit (INDEPENDENT_AMBULATORY_CARE_PROVIDER_SITE_OTHER): Payer: Medicare Other

## 2010-05-29 ENCOUNTER — Ambulatory Visit (INDEPENDENT_AMBULATORY_CARE_PROVIDER_SITE_OTHER): Payer: Medicare Other | Admitting: Internal Medicine

## 2010-05-29 ENCOUNTER — Encounter: Payer: Self-pay | Admitting: Internal Medicine

## 2010-05-29 VITALS — BP 126/62 | HR 85 | Temp 97.9°F | Ht 70.0 in | Wt 221.2 lb

## 2010-05-29 DIAGNOSIS — E1165 Type 2 diabetes mellitus with hyperglycemia: Secondary | ICD-10-CM

## 2010-05-29 DIAGNOSIS — Z0000 Encounter for general adult medical examination without abnormal findings: Secondary | ICD-10-CM

## 2010-05-29 DIAGNOSIS — IMO0001 Reserved for inherently not codable concepts without codable children: Secondary | ICD-10-CM

## 2010-05-29 DIAGNOSIS — Z125 Encounter for screening for malignant neoplasm of prostate: Secondary | ICD-10-CM

## 2010-05-29 DIAGNOSIS — M5412 Radiculopathy, cervical region: Secondary | ICD-10-CM

## 2010-05-29 DIAGNOSIS — J309 Allergic rhinitis, unspecified: Secondary | ICD-10-CM

## 2010-05-29 HISTORY — DX: Allergic rhinitis, unspecified: J30.9

## 2010-05-29 LAB — CBC WITH DIFFERENTIAL/PLATELET
Basophils Absolute: 0.1 10*3/uL (ref 0.0–0.1)
Basophils Relative: 0.8 % (ref 0.0–3.0)
Eosinophils Relative: 2.5 % (ref 0.0–5.0)
HCT: 34.8 % — ABNORMAL LOW (ref 39.0–52.0)
Hemoglobin: 11.1 g/dL — ABNORMAL LOW (ref 13.0–17.0)
Lymphs Abs: 1.1 10*3/uL (ref 0.7–4.0)
Monocytes Relative: 10.5 % (ref 3.0–12.0)
Neutro Abs: 6.7 10*3/uL (ref 1.4–7.7)
RBC: 4.64 Mil/uL (ref 4.22–5.81)
RDW: 20.4 % — ABNORMAL HIGH (ref 11.5–14.6)

## 2010-05-29 LAB — URINALYSIS, ROUTINE W REFLEX MICROSCOPIC
Ketones, ur: NEGATIVE
Specific Gravity, Urine: 1.015 (ref 1.000–1.030)
Total Protein, Urine: 30
Urine Glucose: 250
pH: 5.5 (ref 5.0–8.0)

## 2010-05-29 LAB — HEPATIC FUNCTION PANEL
Albumin: 3.7 g/dL (ref 3.5–5.2)
Alkaline Phosphatase: 97 U/L (ref 39–117)

## 2010-05-29 LAB — BASIC METABOLIC PANEL
Calcium: 9.3 mg/dL (ref 8.4–10.5)
Creatinine, Ser: 2.4 mg/dL — ABNORMAL HIGH (ref 0.4–1.5)
GFR: 33.39 mL/min — ABNORMAL LOW (ref >60.00)
Glucose, Bld: 243 mg/dL — ABNORMAL HIGH (ref 70–99)
Sodium: 130 mEq/L — ABNORMAL LOW (ref 135–145)

## 2010-05-29 LAB — LIPID PANEL
HDL: 32.8 mg/dL — ABNORMAL LOW (ref >39.00)
Total CHOL/HDL Ratio: 4
VLDL: 25.2 mg/dL (ref 0.0–40.0)

## 2010-05-29 MED ORDER — FEXOFENADINE HCL 180 MG PO TABS: 180.0000 mg | ORAL_TABLET | Freq: Every day | ORAL | Status: DC

## 2010-05-29 NOTE — Patient Instructions (Addendum)
Please call your insurance to see if the shingles shot is covered, and if it is, make a nurse visit appt for the shot Please go to LAB in the Basement for the blood and/or urine tests to be done today Please call the number on the Blue Card (the [facility] System) for results of testing in 2-3 days Take all new medications as prescribed  - the allegra for the allergies Continue all other medications as before Please return in 6 mo with Lab testing done 3-5 days before

## 2010-05-29 NOTE — Assessment & Plan Note (Signed)
Mild, declines tx, exam ok,   to f/u any worsening symptoms or concerns

## 2010-05-29 NOTE — Progress Notes (Signed)
Subjective:    Patient ID: [name], male    DOB: 1932/11/04, 77 y.o.   MRN: [id number]  HPI Here for wellness and f/u;  Overall doing ok;  Pt denies CP, worsening SOB, DOE, wheezing, orthopnea, PND, worsening LE edema, palpitations, dizziness or syncope.  Pt denies neurological change such as new Headache, facial or extremity weakness.  Pt denies polydipsia, polyuria, or low sugar symptoms. Pt states overall good compliance with treatment and medications, good tolerability, and trying to follow lower cholesterol diet.  Pt denies worsening depressive symptoms, suicidal ideation or panic. No fever, wt loss, night sweats, loss of appetite, or other constitutional symptoms.  Pt states good ability with ADL's, low to mod fall risk, walks with cane , fell to left knee about 6 wks ago,  home safety reviewed and adequate, no significant changes in hearing or vision, and occasionally active with exercise. CBG's are usuaully 170 -300, more on the lower side, takes the insulin in the AM.  Does have new onset left cervical radiculitis  symptom with radiation to the upper arm but no weakness or numbness worse,  Mild, does not want MRI or treatment, just wanted to mention.  Did have about 4 episodes over 2 mo of nosebleeds, both sides at different times, small volume and lasts 10-15 min.  No trauma, fever, no sinus pain, colored d/c, HA or ST, or cough.   Better overall he thinks with trying not to blow the nose.    Past Medical History  Diagnosis Date  . CHF (congestive heart failure)     secondary to diastolic dysfunction EF (previous EF 35-45%)ef 60%4/09  . Arrhythmia     atrial fibrillation /pt on amiodarone,thought to be poor coumadin  . Stroke   . Other and unspecified hyperlipidemia   . Hypertension   . Hypertrophy of prostate without urinary obstruction and other lower urinary tract symptoms (LUTS)   . Osteoarthrosis, unspecified whether generalized or localized, unspecified site   . Type II or  unspecified type diabetes mellitus without mention of complication, not stated as uncontrolled   . AV block, Mobitz 1     Intolerance to ACE's / discontiuation of beta blockers  . Obstructive sleep apnea   . Iron deficiency anemia, unspecified     s/p EGD and coloscopy 3/10.gastritis.hemorrhids  . Cerebrovascular accident   . Prostate cancer   . Renal insufficiency   . Obesity    Past Surgical History  Procedure Date  . Prostate surgery   . Prostate surgury     hx of    reports that he has never smoked. He does not have any smokeless tobacco history on file. He reports that he does not drink alcohol. His drug history not on file. family history includes Heart disease in his father. Allergies  Allergen Reactions  . Ace Inhibitors     REACTION: extreme  hypotension  . Beta Adrenergic Blockers     REACTION: use cautiously secondary to 2nd degree heart block, hypotension   Current Outpatient Prescriptions on File Prior to Visit  Medication Sig Dispense Refill  . allopurinol (ZYLOPRIM) 100 MG tablet Take 100 mg by mouth daily.        Marland Kitchen amiodarone (PACERONE) 200 MG tablet 200 mg. 1/2 tab qd       . aspirin 325 MG tablet Take 325 mg by mouth daily.        Marland Kitchen atorvastatin (LIPITOR) 40 MG tablet Take 40 mg by mouth daily.        Marland Kitchen  citalopram (CELEXA) 10 MG tablet Take 1 tablet (10 mg total) by mouth daily.  30 tablet  6  . hydrALAZINE (APRESOLINE) 50 MG tablet Take 50 mg by mouth 3 (three) times daily.        . insulin detemir (LEVEMIR) 100 UNIT/ML injection Inject 70 Units into the skin at bedtime.        . Insulin Pen Needle 31G X 8 MM MISC 1 each by Does not apply route daily.  100 each  1  . isosorbide mononitrate (IMDUR) 60 MG 24 hr tablet Take 1 tablet (60 mg total) by mouth daily.  30 tablet  6  . levothyroxine (LEVOTHROID) 125 MCG tablet Take 125 mcg by mouth daily.        Marland Kitchen omeprazole (PRILOSEC) 20 MG capsule Take 20 mg by mouth daily.        . potassium chloride SA  (K-DUR,KLOR-CON) 20 MEQ tablet Take by mouth 3 (three) times daily.        Marland Kitchen spironolactone (ALDACTONE) 25 MG tablet Take 1 tablet (25 mg total) by mouth daily.  30 tablet  6  . torsemide (DEMADEX) 20 MG tablet Take 20 mg by mouth 2 (two) times daily.        Marland Kitchen zolpidem (AMBIEN) 5 MG tablet Take 5 mg by mouth at bedtime as needed.         Review of Systems Review of Systems  Constitutional: Negative for diaphoresis, activity change, appetite change and unexpected weight change.  HENT: Negative for hearing loss, ear pain, facial swelling, mouth sores and neck stiffness.   Eyes: Negative for pain, redness and visual disturbance.  Respiratory: Negative for shortness of breath and wheezing.   Cardiovascular: Negative for chest pain and palpitations.  Gastrointestinal: Negative for diarrhea, blood in stool, abdominal distention and rectal pain.  Genitourinary: Negative for hematuria, flank pain and decreased urine volume.  Musculoskeletal: Negative for myalgias and joint swelling.  Skin: Negative for color change and wound.  Neurological: Negative for syncope and numbness.  Hematological: Negative for adenopathy.  Psychiatric/Behavioral: Negative for hallucinations, self-injury, decreased concentration and agitation.      Objective:   Physical ExamBP 126/62  Pulse 85  Temp(Src) 97.9 F (36.6 C) (Oral)  Ht 5\' 10"  (1.778 m)  Wt 221 lb 4 oz (100.358 kg)  BMI 31.75 kg/m2  SpO2 97% Physical Exam  VS noted Constitutional: Pt is oriented to person, place, and time. Appears well-developed and well-nourished.  HENT:  Head: Normocephalic and atraumatic.  Right Ear: External ear normal.  Left Ear: External ear normal.  Nose: Nose normal except for mucosal congestion,  Mouth/Throat: Oropharynx is clear and moist. with mild cobblestoning Eyes: Conjunctivae and EOM are normal. Pupils are equal, round, and reactive to light.  Neck: Normal range of motion. Neck supple. No JVD present. No tracheal  deviation present.  Cardiovascular: Normal rate, regular rhythm, normal heart sounds and intact distal pulses.   Pulmonary/Chest: Effort normal and breath sounds normal.  Abdominal: Soft. Bowel sounds are normal. There is no tenderness.  Musculoskeletal: Normal range of motion. Exhibits no edema.  Lymphadenopathy:  Has no cervical adenopathy.  Neurological: Pt is alert and oriented to person, place, and time.Marland Kitchen No cranial nerve deficit  Walks with cane in right hand, no change in chronic neuro defecit with mild distall LLE weakness 4+/5.  Skin: Skin is warm and dry. No rash noted.  Psychiatric:  Has  normal mood and affect. Behavior is normal.  1+ anxious, but smiles  occasionally         Assessment & Plan:

## 2010-05-31 LAB — GLUCOSE, CAPILLARY
Glucose-Capillary: 163 mg/dL — ABNORMAL HIGH (ref 70–99)
Glucose-Capillary: 169 mg/dL — ABNORMAL HIGH (ref 70–99)
Glucose-Capillary: 178 mg/dL — ABNORMAL HIGH (ref 70–99)
Glucose-Capillary: 243 mg/dL — ABNORMAL HIGH (ref 70–99)
Glucose-Capillary: 250 mg/dL — ABNORMAL HIGH (ref 70–99)
Glucose-Capillary: 264 mg/dL — ABNORMAL HIGH (ref 70–99)
Glucose-Capillary: 281 mg/dL — ABNORMAL HIGH (ref 70–99)
Glucose-Capillary: 289 mg/dL — ABNORMAL HIGH (ref 70–99)
Glucose-Capillary: 296 mg/dL — ABNORMAL HIGH (ref 70–99)
Glucose-Capillary: 378 mg/dL — ABNORMAL HIGH (ref 70–99)
Glucose-Capillary: 376 mg/dL — ABNORMAL HIGH (ref 70–99)

## 2010-05-31 LAB — BASIC METABOLIC PANEL
BUN: 27 mg/dL — ABNORMAL HIGH (ref 6–23)
BUN: 41 mg/dL — ABNORMAL HIGH (ref 6–23)
BUN: 78 mg/dL — ABNORMAL HIGH (ref 6–23)
CO2: 25 mEq/L (ref 19–32)
CO2: 28 mEq/L (ref 19–32)
CO2: 32 mEq/L (ref 19–32)
CO2: 33 mEq/L — ABNORMAL HIGH (ref 19–32)
Calcium: 8.4 mg/dL (ref 8.4–10.5)
Calcium: 8.6 mg/dL (ref 8.4–10.5)
Calcium: 8.9 mg/dL (ref 8.4–10.5)
Chloride: 102 mEq/L (ref 96–112)
Chloride: 92 mEq/L — ABNORMAL LOW (ref 96–112)
Creatinine, Ser: 1.46 mg/dL (ref 0.4–1.5)
Creatinine, Ser: 1.56 mg/dL — ABNORMAL HIGH (ref 0.4–1.5)
Creatinine, Ser: 2.08 mg/dL — ABNORMAL HIGH (ref 0.4–1.5)
Creatinine, Ser: 2.6 mg/dL — ABNORMAL HIGH (ref 0.4–1.5)
GFR calc Af Amer: 38 mL/min — ABNORMAL LOW (ref >60)
GFR calc Af Amer: 53 mL/min — ABNORMAL LOW (ref >60)
GFR calc non Af Amer: 31 mL/min — ABNORMAL LOW (ref >60)
Glucose, Bld: 140 mg/dL — ABNORMAL HIGH (ref 70–99)
Glucose, Bld: 283 mg/dL — ABNORMAL HIGH (ref 70–99)
Potassium: 2.3 mEq/L — CL (ref 3.5–5.1)
Potassium: 3 mEq/L — ABNORMAL LOW (ref 3.5–5.1)
Sodium: 138 mEq/L (ref 135–145)
Sodium: 141 mEq/L (ref 135–145)

## 2010-05-31 LAB — MAGNESIUM: Magnesium: 3.1 mg/dL — ABNORMAL HIGH (ref 1.5–2.5)

## 2010-05-31 LAB — HEMOGLOBIN A1C
Hgb A1c MFr Bld: 8.1 % — ABNORMAL HIGH (ref 4.6–6.1)
Mean Plasma Glucose: 186 mg/dL

## 2010-05-31 LAB — URINALYSIS, ROUTINE W REFLEX MICROSCOPIC
Glucose, UA: 100 mg/dL — AB
Hgb urine dipstick: NEGATIVE
Protein, ur: 30 mg/dL — AB
Specific Gravity, Urine: 1.014 (ref 1.005–1.030)
pH: 7.5 (ref 5.0–8.0)

## 2010-05-31 LAB — CBC
HCT: 29.7 % — ABNORMAL LOW (ref 39.0–52.0)
HCT: 32.2 % — ABNORMAL LOW (ref 39.0–52.0)
Hemoglobin: 9.9 g/dL — ABNORMAL LOW (ref 13.0–17.0)
Hemoglobin: 10.5 g/dL — ABNORMAL LOW (ref 13.0–17.0)
MCHC: 32.6 g/dL (ref 30.0–36.0)
MCV: 81.1 fL (ref 78.0–100.0)
Platelets: 272 10*3/uL (ref 150–400)
RBC: 3.63 MIL/uL — ABNORMAL LOW (ref 4.22–5.81)
RBC: 3.67 MIL/uL — ABNORMAL LOW (ref 4.22–5.81)
RBC: 3.97 MIL/uL — ABNORMAL LOW (ref 4.22–5.81)
WBC: 10.3 10*3/uL (ref 4.0–10.5)
WBC: 9.6 10*3/uL (ref 4.0–10.5)
WBC: 12.9 10*3/uL — ABNORMAL HIGH (ref 4.0–10.5)

## 2010-05-31 LAB — COMPREHENSIVE METABOLIC PANEL
ALT: 16 U/L (ref 0–53)
AST: 24 U/L (ref 0–37)
CO2: 35 mEq/L — ABNORMAL HIGH (ref 19–32)
Calcium: 9.5 mg/dL (ref 8.4–10.5)
Chloride: 85 mEq/L — ABNORMAL LOW (ref 96–112)
GFR calc Af Amer: 24 mL/min — ABNORMAL LOW (ref >60)
GFR calc non Af Amer: 20 mL/min — ABNORMAL LOW (ref >60)
Sodium: 133 mEq/L — ABNORMAL LOW (ref 135–145)

## 2010-05-31 LAB — DIFFERENTIAL
Eosinophils Relative: 1 % (ref 0–5)
Lymphs Abs: 0.9 10*3/uL (ref 0.7–4.0)
Monocytes Absolute: 1.4 10*3/uL — ABNORMAL HIGH (ref 0.1–1.0)

## 2010-05-31 LAB — URINE MICROSCOPIC-ADD ON

## 2010-05-31 LAB — POTASSIUM: Potassium: 2.8 mEq/L — ABNORMAL LOW (ref 3.5–5.1)

## 2010-05-31 LAB — CK TOTAL AND CKMB (NOT AT ARMC): Total CK: 197 U/L (ref 7–232)

## 2010-05-31 LAB — CARDIAC PANEL(CRET KIN+CKTOT+MB+TROPI): Relative Index: 1.2 (ref 0.0–2.5)

## 2010-06-01 LAB — FERRITIN: Ferritin: 40 ng/mL (ref 22–322)

## 2010-06-01 LAB — BASIC METABOLIC PANEL
BUN: 19 mg/dL (ref 6–23)
BUN: 26 mg/dL — ABNORMAL HIGH (ref 6–23)
BUN: 28 mg/dL — ABNORMAL HIGH (ref 6–23)
BUN: 33 mg/dL — ABNORMAL HIGH (ref 6–23)
BUN: 36 mg/dL — ABNORMAL HIGH (ref 6–23)
BUN: 37 mg/dL — ABNORMAL HIGH (ref 6–23)
CO2: 25 mEq/L (ref 19–32)
CO2: 27 mEq/L (ref 19–32)
CO2: 27 mEq/L (ref 19–32)
Calcium: 8.6 mg/dL (ref 8.4–10.5)
Calcium: 9 mg/dL (ref 8.4–10.5)
Calcium: 9.1 mg/dL (ref 8.4–10.5)
Calcium: 9.3 mg/dL (ref 8.4–10.5)
Calcium: 9.3 mg/dL (ref 8.4–10.5)
Chloride: 101 mEq/L (ref 96–112)
Chloride: 103 mEq/L (ref 96–112)
Chloride: 104 mEq/L (ref 96–112)
Chloride: 108 mEq/L (ref 96–112)
Creatinine, Ser: 1.47 mg/dL (ref 0.4–1.5)
Creatinine, Ser: 1.57 mg/dL — ABNORMAL HIGH (ref 0.4–1.5)
Creatinine, Ser: 1.61 mg/dL — ABNORMAL HIGH (ref 0.4–1.5)
Creatinine, Ser: 1.69 mg/dL — ABNORMAL HIGH (ref 0.4–1.5)
Creatinine, Ser: 1.71 mg/dL — ABNORMAL HIGH (ref 0.4–1.5)
Creatinine, Ser: 1.84 mg/dL — ABNORMAL HIGH (ref 0.4–1.5)
Creatinine, Ser: 2.04 mg/dL — ABNORMAL HIGH (ref 0.4–1.5)
Creatinine, Ser: 2.07 mg/dL — ABNORMAL HIGH (ref 0.4–1.5)
GFR calc Af Amer: 38 mL/min — ABNORMAL LOW (ref >60)
GFR calc Af Amer: 44 mL/min — ABNORMAL LOW (ref >60)
GFR calc Af Amer: 57 mL/min — ABNORMAL LOW (ref >60)
GFR calc non Af Amer: 39 mL/min — ABNORMAL LOW (ref >60)
GFR calc non Af Amer: 40 mL/min — ABNORMAL LOW (ref >60)
GFR calc non Af Amer: 42 mL/min — ABNORMAL LOW (ref >60)
Potassium: 3.2 mEq/L — ABNORMAL LOW (ref 3.5–5.1)
Potassium: 3.4 mEq/L — ABNORMAL LOW (ref 3.5–5.1)
Potassium: 3.8 mEq/L (ref 3.5–5.1)
Potassium: 4.3 mEq/L (ref 3.5–5.1)
Sodium: 139 mEq/L (ref 135–145)

## 2010-06-01 LAB — DIFFERENTIAL
Eosinophils Absolute: 0.3 10*3/uL (ref 0.0–0.7)
Eosinophils Relative: 3 % (ref 0–5)
Lymphocytes Relative: 10 % — ABNORMAL LOW (ref 12–46)
Lymphocytes Relative: 12 % (ref 12–46)
Lymphs Abs: 0.9 10*3/uL (ref 0.7–4.0)
Lymphs Abs: 0.9 10*3/uL (ref 0.7–4.0)
Monocytes Relative: 10 % (ref 3–12)
Neutrophils Relative %: 75 % (ref 43–77)

## 2010-06-01 LAB — CBC
HCT: 26.9 % — ABNORMAL LOW (ref 39.0–52.0)
HCT: 27.6 % — ABNORMAL LOW (ref 39.0–52.0)
HCT: 31.8 % — ABNORMAL LOW (ref 39.0–52.0)
Hemoglobin: 9.3 g/dL — ABNORMAL LOW (ref 13.0–17.0)
MCHC: 32.2 g/dL (ref 30.0–36.0)
MCHC: 33.6 g/dL (ref 30.0–36.0)
MCV: 79.2 fL (ref 78.0–100.0)
MCV: 79.9 fL (ref 78.0–100.0)
MCV: 80.1 fL (ref 78.0–100.0)
Platelets: 338 10*3/uL (ref 150–400)
Platelets: 360 10*3/uL (ref 150–400)
RBC: 3.36 MIL/uL — ABNORMAL LOW (ref 4.22–5.81)
RBC: 3.44 MIL/uL — ABNORMAL LOW (ref 4.22–5.81)
WBC: 7.5 10*3/uL (ref 4.0–10.5)
WBC: 7.8 10*3/uL (ref 4.0–10.5)
WBC: 9.1 10*3/uL (ref 4.0–10.5)

## 2010-06-01 LAB — GLUCOSE, CAPILLARY
Glucose-Capillary: 103 mg/dL — ABNORMAL HIGH (ref 70–99)
Glucose-Capillary: 117 mg/dL — ABNORMAL HIGH (ref 70–99)
Glucose-Capillary: 125 mg/dL — ABNORMAL HIGH (ref 70–99)
Glucose-Capillary: 152 mg/dL — ABNORMAL HIGH (ref 70–99)
Glucose-Capillary: 156 mg/dL — ABNORMAL HIGH (ref 70–99)
Glucose-Capillary: 156 mg/dL — ABNORMAL HIGH (ref 70–99)
Glucose-Capillary: 162 mg/dL — ABNORMAL HIGH (ref 70–99)
Glucose-Capillary: 178 mg/dL — ABNORMAL HIGH (ref 70–99)
Glucose-Capillary: 232 mg/dL — ABNORMAL HIGH (ref 70–99)
Glucose-Capillary: 399 mg/dL — ABNORMAL HIGH (ref 70–99)
Glucose-Capillary: 83 mg/dL (ref 70–99)

## 2010-06-01 LAB — IRON AND TIBC
Iron: 18 ug/dL — ABNORMAL LOW (ref 42–135)
TIBC: 337 ug/dL (ref 215–435)

## 2010-06-01 LAB — LIPID PANEL
Cholesterol: 125 mg/dL (ref 0–200)
HDL: 31 mg/dL — ABNORMAL LOW (ref >39)
LDL Cholesterol: 68 mg/dL (ref 0–99)
Triglycerides: 130 mg/dL (ref <150)

## 2010-06-01 LAB — URINALYSIS, ROUTINE W REFLEX MICROSCOPIC
Glucose, UA: 100 mg/dL — AB
Leukocytes, UA: NEGATIVE
Protein, ur: 30 mg/dL — AB
pH: 5.5 (ref 5.0–8.0)

## 2010-06-01 LAB — TSH: TSH: 7.691 u[IU]/mL — ABNORMAL HIGH (ref 0.350–4.500)

## 2010-06-01 LAB — POCT CARDIAC MARKERS
CKMB, poc: 1.8 ng/mL (ref 1.0–8.0)
CKMB, poc: 1.9 ng/mL (ref 1.0–8.0)
Myoglobin, poc: 148 ng/mL (ref 12–200)

## 2010-06-01 LAB — BRAIN NATRIURETIC PEPTIDE
Pro B Natriuretic peptide (BNP): 105 pg/mL — ABNORMAL HIGH (ref 0.0–100.0)
Pro B Natriuretic peptide (BNP): 144 pg/mL — ABNORMAL HIGH (ref 0.0–100.0)
Pro B Natriuretic peptide (BNP): 300 pg/mL — ABNORMAL HIGH (ref 0.0–100.0)
Pro B Natriuretic peptide (BNP): 309 pg/mL — ABNORMAL HIGH (ref 0.0–100.0)
Pro B Natriuretic peptide (BNP): 91 pg/mL (ref 0.0–100.0)

## 2010-06-01 LAB — POCT I-STAT, CHEM 8
BUN: 26 mg/dL — ABNORMAL HIGH (ref 6–23)
Creatinine, Ser: 1.9 mg/dL — ABNORMAL HIGH (ref 0.4–1.5)
Glucose, Bld: 228 mg/dL — ABNORMAL HIGH (ref 70–99)
Hemoglobin: 9.9 g/dL — ABNORMAL LOW (ref 13.0–17.0)
Potassium: 4 mEq/L (ref 3.5–5.1)
TCO2: 26 mmol/L (ref 0–100)

## 2010-06-01 LAB — RETICULOCYTES: Retic Ct Pct: 3.3 % — ABNORMAL HIGH (ref 0.4–3.1)

## 2010-06-01 LAB — FOLATE: Folate: 19.3 ng/mL

## 2010-06-01 LAB — URINE MICROSCOPIC-ADD ON

## 2010-06-05 ENCOUNTER — Other Ambulatory Visit: Payer: Self-pay | Admitting: Internal Medicine

## 2010-06-06 ENCOUNTER — Other Ambulatory Visit: Payer: Self-pay

## 2010-06-06 MED ORDER — GLUCOSE BLOOD VI STRP: ORAL_STRIP | Status: DC

## 2010-06-08 ENCOUNTER — Other Ambulatory Visit: Payer: Self-pay | Admitting: Internal Medicine

## 2010-06-19 ENCOUNTER — Inpatient Hospital Stay (HOSPITAL_COMMUNITY)
Admission: EM | Admit: 2010-06-19 | Discharge: 2010-06-21 | DRG: 683 | Disposition: A | Discharge status: Home / Self Care | Payer: Medicare Other | Attending: Internal Medicine | Admitting: Internal Medicine

## 2010-06-19 DIAGNOSIS — Z66 Do not resuscitate: Secondary | ICD-10-CM | POA: Diagnosis present

## 2010-06-19 DIAGNOSIS — R040 Epistaxis: Secondary | ICD-10-CM | POA: Diagnosis present

## 2010-06-19 DIAGNOSIS — D509 Iron deficiency anemia, unspecified: Secondary | ICD-10-CM | POA: Diagnosis present

## 2010-06-19 DIAGNOSIS — G4733 Obstructive sleep apnea (adult) (pediatric): Secondary | ICD-10-CM | POA: Diagnosis present

## 2010-06-19 DIAGNOSIS — K279 Peptic ulcer, site unspecified, unspecified as acute or chronic, without hemorrhage or perforation: Secondary | ICD-10-CM | POA: Diagnosis present

## 2010-06-19 DIAGNOSIS — I059 Rheumatic mitral valve disease, unspecified: Secondary | ICD-10-CM | POA: Diagnosis present

## 2010-06-19 DIAGNOSIS — R471 Dysarthria and anarthria: Secondary | ICD-10-CM | POA: Diagnosis present

## 2010-06-19 DIAGNOSIS — F3289 Other specified depressive episodes: Secondary | ICD-10-CM | POA: Diagnosis present

## 2010-06-19 DIAGNOSIS — N183 Chronic kidney disease, stage 3 unspecified: Secondary | ICD-10-CM | POA: Diagnosis present

## 2010-06-19 DIAGNOSIS — F411 Generalized anxiety disorder: Secondary | ICD-10-CM | POA: Diagnosis present

## 2010-06-19 DIAGNOSIS — I441 Atrioventricular block, second degree: Secondary | ICD-10-CM | POA: Diagnosis present

## 2010-06-19 DIAGNOSIS — Z8673 Personal history of transient ischemic attack (TIA), and cerebral infarction without residual deficits: Secondary | ICD-10-CM

## 2010-06-19 DIAGNOSIS — Z8546 Personal history of malignant neoplasm of prostate: Secondary | ICD-10-CM

## 2010-06-19 DIAGNOSIS — Z7982 Long term (current) use of aspirin: Secondary | ICD-10-CM

## 2010-06-19 DIAGNOSIS — I4891 Unspecified atrial fibrillation: Secondary | ICD-10-CM | POA: Diagnosis present

## 2010-06-19 DIAGNOSIS — F329 Major depressive disorder, single episode, unspecified: Secondary | ICD-10-CM | POA: Diagnosis present

## 2010-06-19 DIAGNOSIS — N179 Acute kidney failure, unspecified: Principal | ICD-10-CM | POA: Diagnosis present

## 2010-06-19 DIAGNOSIS — I509 Heart failure, unspecified: Secondary | ICD-10-CM | POA: Diagnosis present

## 2010-06-19 DIAGNOSIS — E785 Hyperlipidemia, unspecified: Secondary | ICD-10-CM | POA: Diagnosis present

## 2010-06-19 DIAGNOSIS — M109 Gout, unspecified: Secondary | ICD-10-CM | POA: Diagnosis present

## 2010-06-19 DIAGNOSIS — IMO0001 Reserved for inherently not codable concepts without codable children: Secondary | ICD-10-CM | POA: Diagnosis present

## 2010-06-19 DIAGNOSIS — E039 Hypothyroidism, unspecified: Secondary | ICD-10-CM | POA: Diagnosis present

## 2010-06-19 DIAGNOSIS — I5032 Chronic diastolic (congestive) heart failure: Secondary | ICD-10-CM | POA: Diagnosis present

## 2010-06-19 DIAGNOSIS — I129 Hypertensive chronic kidney disease with stage 1 through stage 4 chronic kidney disease, or unspecified chronic kidney disease: Secondary | ICD-10-CM | POA: Diagnosis present

## 2010-06-19 LAB — CBC
HCT: 28.6 % — ABNORMAL LOW (ref 39.0–52.0)
HCT: 27.8 % — ABNORMAL LOW (ref 39.0–52.0)
Hemoglobin: 8.7 g/dL — ABNORMAL LOW (ref 13.0–17.0)
MCH: 23.1 pg — ABNORMAL LOW (ref 26.0–34.0)
MCHC: 30.4 g/dL (ref 30.0–36.0)
MCHC: 30.2 g/dL (ref 30.0–36.0)
MCV: 75.9 fL — ABNORMAL LOW (ref 78.0–100.0)
MCV: 76.4 fL — ABNORMAL LOW (ref 78.0–100.0)
RDW: 20.9 % — ABNORMAL HIGH (ref 11.5–15.5)
WBC: 8.6 10*3/uL (ref 4.0–10.5)
WBC: 6.7 10*3/uL (ref 4.0–10.5)

## 2010-06-19 LAB — POCT I-STAT, CHEM 8
BUN: 73 mg/dL — ABNORMAL HIGH (ref 6–23)
Calcium, Ion: 1.1 mmol/L — ABNORMAL LOW (ref 1.12–1.32)
Creatinine, Ser: 3.1 mg/dL — ABNORMAL HIGH (ref 0.4–1.5)
Hemoglobin: 9.9 g/dL — ABNORMAL LOW (ref 13.0–17.0)
Sodium: 132 mEq/L — ABNORMAL LOW (ref 135–145)
TCO2: 20 mmol/L (ref 0–100)

## 2010-06-19 LAB — URINALYSIS, ROUTINE W REFLEX MICROSCOPIC
Bilirubin Urine: NEGATIVE
Glucose, UA: 100 mg/dL — AB
Hgb urine dipstick: NEGATIVE
Specific Gravity, Urine: 1.016 (ref 1.005–1.030)

## 2010-06-19 LAB — COMPREHENSIVE METABOLIC PANEL
Alkaline Phosphatase: 67 U/L (ref 39–117)
BUN: 72 mg/dL — ABNORMAL HIGH (ref 6–23)
Glucose, Bld: 223 mg/dL — ABNORMAL HIGH (ref 70–99)
Potassium: 5.4 mEq/L — ABNORMAL HIGH (ref 3.5–5.1)
Total Protein: 6.8 g/dL (ref 6.0–8.3)

## 2010-06-19 LAB — DIFFERENTIAL
Basophils Absolute: 0 10*3/uL (ref 0.0–0.1)
Basophils Relative: 0 % (ref 0–1)
Blasts: 0 %
Lymphocytes Relative: 8 % — ABNORMAL LOW (ref 12–46)
Myelocytes: 1 %
Neutro Abs: 7.3 10*3/uL (ref 1.7–7.7)
Neutrophils Relative %: 80 % — ABNORMAL HIGH (ref 43–77)
Promyelocytes Absolute: 0 %

## 2010-06-19 LAB — URINE MICROSCOPIC-ADD ON

## 2010-06-19 LAB — GLUCOSE, CAPILLARY: Glucose-Capillary: 241 mg/dL — ABNORMAL HIGH (ref 70–99)

## 2010-06-20 LAB — APTT: aPTT: 36 seconds (ref 24–37)

## 2010-06-20 LAB — BASIC METABOLIC PANEL
BUN: 64 mg/dL — ABNORMAL HIGH (ref 6–23)
CO2: 21 mEq/L (ref 19–32)
Chloride: 107 mEq/L (ref 96–112)
Creatinine, Ser: 2.14 mg/dL — ABNORMAL HIGH (ref 0.4–1.5)
Glucose, Bld: 163 mg/dL — ABNORMAL HIGH (ref 70–99)

## 2010-06-20 LAB — IRON AND TIBC
Saturation Ratios: 4 % — ABNORMAL LOW (ref 20–55)
UIBC: 329 ug/dL

## 2010-06-20 LAB — CBC
MCH: 23.5 pg — ABNORMAL LOW (ref 26.0–34.0)
MCV: 76.5 fL — ABNORMAL LOW (ref 78.0–100.0)
Platelets: 248 10*3/uL (ref 150–400)
RDW: 20.7 % — ABNORMAL HIGH (ref 11.5–15.5)
WBC: 7.4 10*3/uL (ref 4.0–10.5)

## 2010-06-20 LAB — FOLATE: Folate: >20 ng/mL

## 2010-06-20 LAB — VITAMIN B12: Vitamin B-12: 595 pg/mL (ref 211–911)

## 2010-06-20 NOTE — H&P (Addendum)
[name], HIBLER NO.:  [id number]  MEDICAL RECORD NO.:  [id number]           PATIENT TYPE:  E  LOCATION:  WLED                         FACILITY:  [facility]  PHYSICIAN:  Hillery Aldo, M.D.   DATE OF BIRTH:  12-25-1932  DATE OF ADMISSION:  06/19/2010 DATE OF DISCHARGE:                             HISTORY & PHYSICAL   PRIMARY CARE PROVIDER:  Corwin Levins, MD.  CARDIOLOGIST:  [name]. Bensimhon, MD  The patient is being admitted to [facility].  CHIEF COMPLAINT:  Nosebleed.  HISTORY OF PRESENT ILLNESS:  [name] is a very pleasant 77 year old male with a history of hypertension, AFIB, not a Coumadin candidate, CVA, chronic diastolic heart failure and chronic kidney disease stage 3 who presents to the [facility] ED with the chief complaint of nosebleed.  Information is obtained from the patient and the son who is at the bedside as well as records.  The patient reports that this morning after breakfast, he developed a nosebleed that started on the left side.  He indicates that he packed the area with tissue and went on to the adult center where he goes every day from 9-12.  He indicates that the bleeding continued and when he arrived at the adult center, they requested that he be taken to the emergency.  The patient denies any headache, dizziness, recent illness, chest pain, shortness of breath.  He denies any injury to his nose.  He reports that he has been eating his usual amount but does not take in much fluid as on occasion if he does take in too much fluid, his legs will swell.  Workup in the emergency room yields a creatinine of 3.1 and a hemoglobin of 8.7. Symptoms came on suddenly, are resolving, characterized as moderate.  We are asked to admit for further evaluation and treatment.  ALLERGIES:  No known drug allergies.  PAST MEDICAL HISTORY: 1. AFIB, not on Coumadin. 2. History of chronic diastolic heart  failure. 3. Hypertension. 4. Hypothyroidism. 5. Diabetes type 2, uncontrolled. 6. Chronic kidney disease stage 3. 7. Iron deficiency anemia. 8. Second-degree AV block. 9. Hyperlipidemia. 10.History of stroke. 11.Chronic dysarthria. 12.Hyperlipidemia. 13.History of obstructive sleep apnea. 14.Prostate cancer. 15.Depression/anxiety. 16.Remote EtOH abuse. 17.Gout. 18.History of peptic ulcer disease.  PAST SURGICAL HISTORY:  Prostatectomy.  FAMILY MEDICAL HISTORY:  His mother is alive.  She is 85 years old, and she has dementia.  Father is deceased at age 65, he died from a stroke. The patient has 1 sister who has hypertension and diabetes.  SOCIAL HISTORY:  The patient lives alone but his son lives next door and is his primary caregiver.  He also has a daughter in town who assists with his care.  He is a remote EtOH abuser.  Denies tobacco use.  Has never used any drugs.  He is able to function fairly independently with his ADLs.  He does ambulate with a cane.  MEDICATIONS: 1. Levemir subcu 90 units q.a.m. 2. Torsemide 20 mg p.o. 1 tablet b.i.d. 3. Spironolactone 25 mg p.o. 1 tablet daily. 4. Potassium chloride 20 mEq  p.o. 1 tablet t.i.d. 5. Lipitor 40 mg p.o. 1 tablet every evening. 6. Levothyroxine 125 mcg p.o. 1 tablet daily. 7. Hydralazine 50 mg p.o. 1 tablet t.i.d. 8. Citalopram 10 mg p.o. 1 tablet daily 9. Aspirin enteric-coated 325 mg p.o. 1 tablet daily. 10.Amiodarone 200 mg 0.5 tablets p.o. daily 11.Ambien 5 mg p.o. daily at bedtime as needed for insomnia. 12.Allopurinol 100 mg p.o. daily. 13.Allegra 180 mg p.o. 1 tablet daily as needed for allergies. 14.Omeprazole 20 mg p.o. daily.  REVIEW OF SYSTEMS:  GENERAL:  Negative for fever, chills, anorexia, unintentional weight loss.  ENT:  Negative for ear pain, nasal congestion, sore throat.  Positive for blood from nose.  CV:  Negative for chest pain, palpitations, lower extremity edema.  RESPIRATORY: Negative for  increased work of breathing.  Positive for occasional cough.  MUSCULOSKELETAL:  Negative for joint pain, muscle weakness. NEUROLOGIC:  Negative for headache, dizziness, visual disturbances, numbness, tingling of extremities, photophobia.  GI:  Negative for abdominal pain, nausea, vomiting, diarrhea, constipation, melena.  GU: Negative for dysuria, hematuria, frequency or urgency.  PSYCHIATRIC: Positive for depression, anxiety.  HEME:  See HPI.  LABORATORY DATA:  WBCs 8.6, hemoglobin 8.7, hematocrit 28.6, platelets 269.  Sodium 132, potassium 5.1, chloride 105, CO2 20, BUN 73, creatinine 3.1.  Urinalysis was negative for leukocytes, 30 protein, 100 glucose, clear in appearance.  PHYSICAL EXAMINATION:  VITAL SIGNS:  Temperature 98.4, blood pressure 117/65, heart rate 82, respirations 20, sats 94% on room air. GENERAL:  Awake, alert, well nourished, well hydrated, no acute distress. HEAD:  Normocephalic, atraumatic.  Pupils equal, round, reactive to light.  EOMI.  Mucous membranes of his mouth are moist and pink.  Nasal mucosa clear.  No active bleeding. NECK:  Supple.  No JVD.  Full range of motion.  No lymphadenopathy. RESPIRATORY:  No increased work of breathing.  Breath sounds clear to auscultation bilaterally.  No rhonchi, wheezes or rales. ABDOMEN:  Obese, soft.  Positive bowel sounds throughout, nontender to palpation.  No mass or organomegaly noted. NEUROLOGIC:  Alert and oriented x3.  Speech is somewhat slurred. Cranial nerves II-XII grossly intact. MUSCULOSKELETAL:  Moves all extremities.  No joint swelling/erythema. Full range of motion. EXTREMITIES:  Without clubbing or cyanosis.  ASSESSMENT AND PLAN: 1. Acute renal failure.  Chart indicates that baseline creatinine is     1.6-1.7.  Creatinine is currently 3.1, etiology unclear and     possibly multifactorial.  We will admit to medical floor.  We will     hydrate gently with IV fluids.  We will hold nephrotoxins.  We  will     check a UA.  We will recheck in the a.m. 2. Anemia.  Patient with history of iron deficiency anemia.  We will     check stools for occult blood.  We will get an anemia panel.  We     will continue his iron.  We will monitor hemoglobin and hematocrit.     No signs of active bleeding currently. 3. Anterior epistaxis on the left.  Resolved at the time of exam.  We     will monitor. 4. History of hypertension.  Blood pressure is currently 117/65.  We     will continue his hydralazine.  We will hold spironolactone     secondary to acute renal failure. 5. History of atrial fibrillation, not a Coumadin candidate.  In sinus     rhythm on amiodarone.  We will continue. 6. Diabetes type 2, uncontrolled.  Hemoglobin A1c was 11.5 two weeks     ago.  We will continue his Levemir and use sliding scale glycemic     control. 7. Chronic kidney disease stage 3.  See problem #1. 8. History of chronic diastolic congestive heart failure.  Ejection     fraction was 60-65% in March 2011.  Holding spironolactone for now     secondary to acute renal failure.  We will maintain strict Is and     Os and daily weights.  We will monitor closely. 9. Hypothyroidism.  TSH 2 weeks ago was 3.09.  We will continue his     home meds. 10.Hyperlipidemia.  Lipid profile done 2 weeks ago.  We will continue     his statin. 11.History of anxiety, currently at baseline.  We will continue his     home meds. 12.History of cerebrovascular accident, currently at baseline.  We     will hold his aspirin for now secondary to anterior epistaxis on     the left.  We will request PT and OT to eval. 13.History of obstructive sleep apnea.  The patient is currently not     on CPAP at night. 14.Deep venous thrombosis prophylaxis.  We will use SCDs. 15.Code status.  The patient is a no code.  It was truly a pleasure taking care of Mr. Dohn.     Gwenyth Bender, NP   ______________________________ Hillery Aldo,  M.D.    KMB/MEDQ  D:  06/19/2010  T:  06/19/2010  Job:  [id number]  cc:   Corwin Levins, MD [address] [place] [address]  [name]. Bensimhon, MD [address] [place] [address]  Electronically Signed by Hillery Aldo M.D. on 06/20/2010 07:04:26 AM Electronically Signed by Toya Smothers  on 07/12/2010 04:47:30 PM

## 2010-06-21 LAB — BASIC METABOLIC PANEL
BUN: 43 mg/dL — ABNORMAL HIGH (ref 6–23)
CO2: 21 mEq/L (ref 19–32)
Chloride: 106 mEq/L (ref 96–112)
Creatinine, Ser: 1.84 mg/dL — ABNORMAL HIGH (ref 0.4–1.5)
GFR calc Af Amer: 44 mL/min — ABNORMAL LOW (ref >60)

## 2010-06-21 LAB — CBC
Hemoglobin: 9.1 g/dL — ABNORMAL LOW (ref 13.0–17.0)
MCH: 23.9 pg — ABNORMAL LOW (ref 26.0–34.0)
MCV: 77.6 fL — ABNORMAL LOW (ref 78.0–100.0)
RBC: 3.8 MIL/uL — ABNORMAL LOW (ref 4.22–5.81)

## 2010-06-21 LAB — HEMOGLOBIN A1C: Mean Plasma Glucose: 237 mg/dL — ABNORMAL HIGH (ref <117)

## 2010-06-22 NOTE — Discharge Summary (Addendum)
[name], [name]              ACCOUNT NO.:  [id number]  MEDICAL RECORD NO.:  [id number]           PATIENT TYPE:  I  LOCATION:  1502                         FACILITY:  [facility]  PHYSICIAN:  Brendia Sacks, MD    DATE OF BIRTH:  1932-09-23  DATE OF ADMISSION:  06/19/2010 DATE OF DISCHARGE:  06/21/2010                              DISCHARGE SUMMARY   PRIMARY CARE PROVIDER:  Corwin Levins, MD.  CARDIOLOGIST:  [name]. Bensimhon, MD.  DISCHARGE DIAGNOSES: 1. Acute renal failure. 2. Anemia. 3. Epistaxis. 4. History of hypertension. 5. History of atrial fibrillation. 6. Diabetes type 2, uncontrolled. 7. Chronic kidney disease stage 3. 8. Chronic diastolic heart failure. 9. Hypothyroidism. 10.Hyperlipidemia. 11.Anxiety. 12.History of cerebrovascular accident. 13.History of obstructive sleep apnea.  DIAGNOSTIC STUDIES:  Sodium was 132, potassium 5.1, chloride 105, CO2 of 20, BUN 73, creatinine 3.1.  WBC 8.6, hemoglobin 8.7, hematocrit 28.6, MCV 75.9, platelets 269.  Urinalysis was negative for leukocytes, 30 protein, 100 glucose, hyaline casts, rare bacteria.  Total bili 0.3, alk phos 67, AST 24, ALT 25, albumin 3.0, PTT 36, PT 14.0, INR 1.06.  CONSULTATIONS:  None.  BRIEF SUMMARY:  [name] is a 77 year old male with a history of hypertension; AFib, not a Coumadin candidate; CVA; chronic diastolic heart failure; and chronic kidney disease stage 3; who presented to the [facility] ED with a chief complaint of nosebleed.  The patient reported spontaneous bleeding of left nare on the morning of June 19, 2010.  He packed it and went to his adult day care center.  The The day center referred him to the emergency room.  While in the emergency room, he was noted to have a creatinine of 3.1 and a hemoglobin of 8.7.  He was admitted for further evaluation and treatment.  DISCHARGE MEDICATIONS: 1. Allegra 180 mg p.o. daily as needed for congestion. 2. Allopurinol 100 mg  p.o. daily. 3. Ambien 5 mg p.o. daily at bedtime as needed for sleep. 4. Amiodarone 200 mg 1/2 a tab p.o. daily. 5. Citalopram 10 mg 1 tablet p.o. daily. 6. Hydralazine 50 mg p.o. t.i.d. 7. Levemir 90 units subcu every morning. 8. Lipitor 40 mg p.o. every evening. 9. Levothyroxine 125 mcg p.o. daily. 10.Omeprazole 20 mg p.o. daily. 11.Torsemide 20 mg p.o. b.i.d. (in the afternoon and at bedtime).  Medications stopped are spironolactone 25 mg p.o. daily, aspirin enteric- coated 325 mg p.o. daily, potassium chloride 20 mEq p.o. t.i.d.  HOSPITAL COURSE BY PROBLEM: 1. Acute renal failure in setting of chronic kidney disease stage 3,     thought to be a result of decreased p.o. intake as well as     diuretics.  The patient was admitted to medical floor and given     gentle IV hydration.  His creatinine responded favorably and today     is 1.8 which is close to his baseline of 1.7.  There was no     indication of infection or obstruction.  During his     hospitalization, his spironolactone and torsemide were held.  The     patient will be discharged on torsemide  only.  The patient's     potassium was 5.1 on admission, so we will  continue to hold     spironolactone until the patient follows up with his primary care     provider on Jul 03, 2010. 2. Anemia.  The patient has a history of iron deficiency anemia.     Chart review indicates that his baseline is 9.3.  He was 8.4 on     admission.  He received 1 unit of packed red blood cells.  The     patient also had a positive FOBT likely secondary to swallowing     blood from the epistaxis. 3. Anterior left epistaxis.  The patient received Afrin and packing in     the ED and resolved during that time and he had no recurrence in     this hospitalization. 4. Hypertension.  Controlled with hydralazine only during his     hospitalization.  Will discharge on hydralazine and torsemide. 5. History of AFib.  The patient remained in sinus rhythm,  rate     controlled while in the ED.  Was admitted to medical floor.     Continue his amiodarone. 6. Diabetes type 2, uncontrolled.  The patient on sliding scale with     meal coverage and home dose of Lantus while in hospitalization.     The patient not on an ACE as history of hypotension and unable to     tolerate beta-blocker as well.  Will continue his Levemir after     discharge.  Will need followup with primary care provider. 7. Chronic kidney disease stage 3.  See problem #1. 8. History of chronic diastolic heart failure.  The patient showed no     signs of fluid overload during his hospitalization.  His torsemide     and spironolactone were held due to problem #1.  The patient did     have a 2-D echo in March of 2011 which yielded an ejection fraction     of 60-65% with mild mitral valve regurg.  The patient being     discharged on his home dose of torsemide.  Spirolactone being held     until primary care provider can evaluate his potassium level and     his kidney function.  In addition, we will request that the patient     weigh himself daily between now on Jul 03, 2010, and documented the     data so he can take it to his primary care provider appointment on     Jul 03, 2010. 9. Hypothyroidism.  His TSH was 3.09 in April of this year.  Will     continue his home meds. 10.Hyperlipidemia.  Lipid profile done May 29, 2010.  Continue his     Lipitor. 11.History of anxiety, remained at baseline during hospitalization.     Continue his citalopram 12.History of obstructive sleep apnea, not on a CPAP machine at home.     The patient had no issues during this hospitalization.  FOLLOWUP:  The patient has an appointment with Dr. Oliver Barre on Jul 03, 2010, at 11:00 a.m.  He is to weigh himself everyday between now and then, keep a log and take that to his appointment.  TIME SPENT ON THIS DISCHARGE:  45 minutes.     Gwenyth Bender, NP   ______________________________ Brendia Sacks, MD    KMB/MEDQ  D:  06/21/2010  T:  06/21/2010  Job:  [id number]  cc:  [name]. Bensimhon, MD [address] [place] [address]  Corwin Levins, MD [address] [place] [address]  Electronically Signed by Brendia Sacks  on 06/22/2010 05:58:10 PM Electronically Signed by Toya Smothers  on 07/12/2010 04:47:19 PM

## 2010-06-23 ENCOUNTER — Other Ambulatory Visit: Payer: Self-pay

## 2010-06-23 LAB — CROSSMATCH
ABO/RH(D): O POS
Antibody Screen: NEGATIVE
Unit division: 0

## 2010-06-23 MED ORDER — ONETOUCH ULTRASOFT LANCETS MISC: 1.0000 | Freq: Every day | Status: DC

## 2010-06-26 ENCOUNTER — Telehealth: Payer: Self-pay | Admitting: Internal Medicine

## 2010-06-26 NOTE — Telephone Encounter (Signed)
Pt needs torsemide to be called in to [facility] # [phone]

## 2010-06-27 ENCOUNTER — Other Ambulatory Visit: Payer: Self-pay | Admitting: *Deleted

## 2010-06-27 MED ORDER — TORSEMIDE 20 MG PO TABS: 20.0000 mg | ORAL_TABLET | Freq: Two times a day (BID) | ORAL | Status: DC

## 2010-06-28 ENCOUNTER — Other Ambulatory Visit: Payer: Self-pay | Admitting: *Deleted

## 2010-06-28 MED ORDER — TORSEMIDE 20 MG PO TABS: 20.0000 mg | ORAL_TABLET | Freq: Two times a day (BID) | ORAL | Status: DC

## 2010-07-03 ENCOUNTER — Ambulatory Visit (INDEPENDENT_AMBULATORY_CARE_PROVIDER_SITE_OTHER): Payer: Medicare Other | Admitting: Internal Medicine

## 2010-07-03 ENCOUNTER — Other Ambulatory Visit (INDEPENDENT_AMBULATORY_CARE_PROVIDER_SITE_OTHER): Payer: Medicare Other

## 2010-07-03 ENCOUNTER — Encounter: Payer: Self-pay | Admitting: Internal Medicine

## 2010-07-03 VITALS — BP 160/70 | HR 74 | Temp 97.9°F | Ht 70.0 in | Wt 222.2 lb

## 2010-07-03 DIAGNOSIS — Z0000 Encounter for general adult medical examination without abnormal findings: Secondary | ICD-10-CM

## 2010-07-03 DIAGNOSIS — I509 Heart failure, unspecified: Secondary | ICD-10-CM

## 2010-07-03 DIAGNOSIS — D649 Anemia, unspecified: Secondary | ICD-10-CM | POA: Insufficient documentation

## 2010-07-03 DIAGNOSIS — D509 Iron deficiency anemia, unspecified: Secondary | ICD-10-CM

## 2010-07-03 DIAGNOSIS — R040 Epistaxis: Secondary | ICD-10-CM

## 2010-07-03 DIAGNOSIS — I5032 Chronic diastolic (congestive) heart failure: Secondary | ICD-10-CM

## 2010-07-03 DIAGNOSIS — M109 Gout, unspecified: Secondary | ICD-10-CM

## 2010-07-03 LAB — CBC WITH DIFFERENTIAL/PLATELET
Eosinophils Relative: 2.4 % (ref 0.0–5.0)
HCT: 30.8 % — ABNORMAL LOW (ref 39.0–52.0)
Hemoglobin: 9.9 g/dL — ABNORMAL LOW (ref 13.0–17.0)
Lymphs Abs: 1.1 10*3/uL (ref 0.7–4.0)
Monocytes Relative: 11.7 % (ref 3.0–12.0)
Neutro Abs: 4.5 10*3/uL (ref 1.4–7.7)
Platelets: 497 10*3/uL — ABNORMAL HIGH (ref 150.0–400.0)
RBC: 3.99 Mil/uL — ABNORMAL LOW (ref 4.22–5.81)
WBC: 6.6 10*3/uL (ref 4.5–10.5)

## 2010-07-03 LAB — BASIC METABOLIC PANEL
CO2: 25 mEq/L (ref 19–32)
Calcium: 9.8 mg/dL (ref 8.4–10.5)
Glucose, Bld: 186 mg/dL — ABNORMAL HIGH (ref 70–99)
Sodium: 134 mEq/L — ABNORMAL LOW (ref 135–145)

## 2010-07-03 MED ORDER — INSULIN DETEMIR 100 UNIT/ML ~~LOC~~ SOLN: 90.0000 [IU] | Freq: Every day | SUBCUTANEOUS | Status: DC

## 2010-07-03 MED ORDER — INSULIN DETEMIR 100 UNIT/ML ~~LOC~~ SOLN: 100.0000 [IU] | Freq: Every day | SUBCUTANEOUS | Status: DC

## 2010-07-03 NOTE — Assessment & Plan Note (Addendum)
Uncontrolled  - to increase the levemir to 100 units per day;  Lab Results  Component Value Date   HGBA1C  Value: 9.9 (NOTE)                                                                       According to the ADA Clinical Practice Recommendations for 2011, when HbA1c is used as a screening test:   >=6.5%   Diagnostic of Diabetes Mellitus           (if abnormal result  is confirmed)  5.7-6.4%   Increased risk of developing Diabetes Mellitus  References:Diagnosis and Classification of Diabetes Mellitus,Diabetes Care,2011,34(Suppl 1):S62-S69 and Standards of Medical Care in         Diabetes - 2011,Diabetes Care,2011,34  (Suppl 1):S11-S61.* 06/21/2010

## 2010-07-03 NOTE — Assessment & Plan Note (Signed)
stable overall by hx and exam, most recent lab reviewed with pt, and pt to continue medical treatment as before  To check uric acid with next visit

## 2010-07-03 NOTE — Assessment & Plan Note (Signed)
stable overall by hx and exam and pt to continue medical treatment as before, check bmp, f/u card as planned

## 2010-07-03 NOTE — Assessment & Plan Note (Signed)
stable overall by hx and exam, most recent lab reviewed with pt, and pt to continue medical treatment as before Lab Results  Component Value Date   WBC 6.6 07/03/2010   HGB 9.9* 07/03/2010   HCT 30.8* 07/03/2010   PLT 497.0* 07/03/2010   CHOL 131 05/29/2010   TRIG 126.0 05/29/2010   HDL 32.80* 05/29/2010   LDLDIRECT 83.9 09/19/2009   ALT 25 06/19/2010   AST 24 06/19/2010   NA 134* 07/03/2010   K 5.7* 07/03/2010   CL 102 07/03/2010   CREATININE 1.7* 07/03/2010   BUN 36* 07/03/2010   CO2 25 07/03/2010   TSH 3.09 05/29/2010   PSA 0.68 05/29/2010   INR 1.06 06/20/2010   HGBA1C  Value: 9.9 (NOTE)                                                                       According to the ADA Clinical Practice Recommendations for 2011, when HbA1c is used as a screening test:   >=6.5%   Diagnostic of Diabetes Mellitus           (if abnormal result  is confirmed)  5.7-6.4%   Increased risk of developing Diabetes Mellitus  References:Diagnosis and Classification of Diabetes Mellitus,Diabetes Care,2011,34(Suppl 1):S62-S69 and Standards of Medical Care in         Diabetes - 2011,Diabetes Care,2011,34  (Suppl 1):S11-S61.* 06/21/2010   MICROALBUR 37.3* 10/11/2006

## 2010-07-03 NOTE — Progress Notes (Signed)
Subjective:    Patient ID: [name], male    DOB: [birth year], 77 y.o.   MRN: [id number]  HPI Here to f/u post hospn with episode epistaxis, anemia requiring 1 unit prbc, acute on CKD;  Left nares packed without further bleeding during hospn and post hospn as well, has not required seeing ENT.  Not on coumadin as not candidiate, afib remained stable, d/c'd on higher dose levemir at 90 units;  Pt denies chest pain, increased sob or doe, wheezing, orthopnea, PND, increased LE swelling, palpitations, dizziness or syncope.  Pt denies new neurological symptoms such as new headache, or facial or extremity weakness or numbness   Pt denies polydipsia, polyuria, or low sugar symptoms such as weakness or confusion improved with po intake.  Pt states overall good compliance with meds, trying to follow lower cholesterol, diabetic diet, wt overall stable but little exercise however.    CBG's have been still in the low 200's.  No other new complaints. Also due to volume depletion torsemide and spironolactone held during hospn, torsemide re-started at d/c, and pt family re-started spironolactone several days ago with increased LE edema began returning and some sob, now resolved. Past Medical History  Diagnosis Date  . CHF (congestive heart failure)     secondary to diastolic dysfunction EF (previous EF 35-45%)ef 60%4/09  . Arrhythmia     atrial fibrillation /pt on amiodarone,thought to be poor coumadin  . Stroke   . Other and unspecified hyperlipidemia   . Hypertension   . Hypertrophy of prostate without urinary obstruction and other lower urinary tract symptoms (LUTS)   . Osteoarthrosis, unspecified whether generalized or localized, unspecified site   . Type II or unspecified type diabetes mellitus without mention of complication, not stated as uncontrolled   . AV block, Mobitz 1     Intolerance to ACE's / discontiuation of beta blockers  . Obstructive sleep apnea   . Iron deficiency anemia, unspecified      s/p EGD and coloscopy 3/10.gastritis.hemorrhids  . Cerebrovascular accident   . Prostate cancer   . Renal insufficiency   . Obesity   . Allergic rhinitis, cause unspecified 05/29/2010   Past Surgical History  Procedure Date  . Prostate surgery   . Prostate surgury     hx of    reports that he has never smoked. He does not have any smokeless tobacco history on file. He reports that he does not drink alcohol. His drug history not on file. family history includes Heart disease in his father. Allergies  Allergen Reactions  . Ace Inhibitors     REACTION: extreme  hypotension  . Beta Adrenergic Blockers     REACTION: use cautiously secondary to 2nd degree heart block, hypotension   Current Outpatient Prescriptions on File Prior to Visit  Medication Sig Dispense Refill  . allopurinol (ZYLOPRIM) 100 MG tablet Take 100 mg by mouth daily.        Marland Kitchen amiodarone (PACERONE) 200 MG tablet 200 mg. 1/2 tab qd       . atorvastatin (LIPITOR) 40 MG tablet Take 40 mg by mouth daily.        . citalopram (CELEXA) 10 MG tablet Take 1 tablet (10 mg total) by mouth daily.  30 tablet  6  . fexofenadine (ALLEGRA) 180 MG tablet Take 1 tablet (180 mg total) by mouth daily.  30 tablet  2  . glucose blood test strip Use as instructed  100 each  5  . hydrALAZINE (APRESOLINE) 50  MG tablet Take 50 mg by mouth 3 (three) times daily.        . Insulin Pen Needle 31G X 8 MM MISC 1 each by Does not apply route daily.  100 each  1  . isosorbide mononitrate (IMDUR) 60 MG 24 hr tablet TAKE 1 TABLET BY MOUTH EVERY DAY  30 tablet  6  . Lancets (ONETOUCH ULTRASOFT) lancets 1 each by Other route daily.  100 each  1  . levothyroxine (LEVOTHROID) 125 MCG tablet Take 125 mcg by mouth daily.        Marland Kitchen omeprazole (PRILOSEC) 20 MG capsule Take 20 mg by mouth daily.        . potassium chloride SA (K-DUR,KLOR-CON) 20 MEQ tablet Take by mouth 3 (three) times daily.        Marland Kitchen spironolactone (ALDACTONE) 25 MG tablet Take 1 tablet (25 mg  total) by mouth daily.  30 tablet  6  . torsemide (DEMADEX) 20 MG tablet Take 1 tablet (20 mg total) by mouth 2 (two) times daily.  60 tablet  6  . zolpidem (AMBIEN) 5 MG tablet Take 5 mg by mouth at bedtime as needed.        Marland Kitchen DISCONTD: insulin detemir (LEVEMIR) 100 UNIT/ML injection Inject 70 Units into the skin at bedtime.        Marland Kitchen aspirin 325 MG tablet Take 325 mg by mouth daily.         Review of Systems All otherwise neg per pt, except did have recent left achilles insertion site tenderness/pain to ambulation, better today     Objective:   Physical Exam BP 160/70  Pulse 74  Temp(Src) 97.9 F (36.6 C) (Oral)  Ht 5\' 10"  (1.778 m)  Wt 222 lb 4 oz (100.812 kg)  BMI 31.89 kg/m2  SpO2 97% Physical Exam  VS noted Constitutional: Pt appears well-developed and well-nourished.  HENT: Head: Normocephalic.  Right Ear: External ear normal.  Left Ear: External ear normal.  Eyes: Conjunctivae and EOM are normal. Pupils are equal, round, and reactive to light.  Neck: Normal range of motion. Neck supple.  Cardiovascular: Normal rate and irregular rhythm.   Pulmonary/Chest: Effort normal and breath sounds normal.  Abd:  Soft, NT, non-distended, + BS Neurological: Pt is alert. No cranial nerve deficit.  Skin: Skin is warm. No erythema.  trace LLE edema noted Psychiatric: Pt behavior is normal. Thought content normal.         Assessment & Plan:

## 2010-07-03 NOTE — Assessment & Plan Note (Signed)
To re-check labs today, o/w stable overall by hx and exam, and pt to continue medical treatment as before including the dual diuretics

## 2010-07-03 NOTE — Assessment & Plan Note (Signed)
To re-check cbc today

## 2010-07-03 NOTE — Patient Instructions (Addendum)
Increase the Levemir to 100 units per day Please continue to monitor your sugars as you do, and call if they remain for the most part over 200 Continue all other medications as before, including the spironolactone as you have already re-started Please go to LAB in the Basement for the blood and/or urine tests to be done today Please call the phone number [phone] (the [employer]Tree System) for results of testing in 2-3 days;  When calling, simply dial the number, and when prompted enter the MRN number above (the Medical Record Number) and the # key, then the message should start. Please keep your appointments with your specialists as you have planned - Dr Teressa Lower tomorrow Please return in 6 mo with Lab testing done 3-5 days before - in October as you already have planned

## 2010-07-04 ENCOUNTER — Encounter: Payer: Self-pay | Admitting: Internal Medicine

## 2010-07-04 ENCOUNTER — Ambulatory Visit (INDEPENDENT_AMBULATORY_CARE_PROVIDER_SITE_OTHER): Payer: Medicare Other | Admitting: Internal Medicine

## 2010-07-04 VITALS — BP 150/60 | HR 61 | Resp 18 | Ht 70.0 in | Wt 222.0 lb

## 2010-07-04 DIAGNOSIS — I4891 Unspecified atrial fibrillation: Secondary | ICD-10-CM

## 2010-07-04 DIAGNOSIS — E875 Hyperkalemia: Secondary | ICD-10-CM | POA: Insufficient documentation

## 2010-07-04 DIAGNOSIS — I5032 Chronic diastolic (congestive) heart failure: Secondary | ICD-10-CM

## 2010-07-04 DIAGNOSIS — I441 Atrioventricular block, second degree: Secondary | ICD-10-CM

## 2010-07-04 DIAGNOSIS — I509 Heart failure, unspecified: Secondary | ICD-10-CM

## 2010-07-04 NOTE — Assessment & Plan Note (Signed)
[facility]                            CARDIOLOGY OFFICE NOTE   ARDELL, [name]                     MRN:          [id number]  DATE:02/26/2008                            DOB:          [birth year]    PRIMARY CARE PHYSICIAN:  Corwin Levins, MD   INTERVAL HISTORY:  [name] is a very pleasant 77 year old male with  a history of diastolic heart failure as well as hypertension,  hyperlipidemia, diabetes, previous CVA, paroxysmal atrial fibrillation  on amiodarone.  He is followed closely by Dorian Pod in the [facility].   He presents today with his daughter, [name] for routine followup.  Overall, he is doing fairly well.  He has been very careful watching his  salt.  He has not had any significant edema.  He denies any chest pain  or shortness of breath.  He recently was in the hospital and had his  beta-blocker discontinued due to second-degree heart block.   CURRENT MEDICATIONS:  1. Potassium 20 t.i.d.  2. Lipitor 20 a day.  3. Amiodarone 100 a day.  4. Glimepiride 4 b.i.d.  5. Allopurinol 100 a day.  6. Aspirin 325 a day.  7. Synthroid 75 a day.  8. Amlodipine 10 a day.  9. Hydralazine 50 t.i.d.  10.Torsemide 40 in the morning and 20 in the afternoon.   PHYSICAL EXAMINATION:  GENERAL:  He is in no acute distress.  He  ambulates around the clinic without any respiratory difficulty.  VITAL SIGNS:  Blood pressure is 164/70, heart rate 74, weight is 260,  which is stable.  HEENT:  Normal.  NECK:  Supple.  There is no obvious JVD.  Carotids are 2+ bilaterally  without any bruits.  There is no lymphadenopathy or thyromegaly.  CARDIAC:  Regular rate and rhythm with 2/6 systolic ejection murmur at  the left sternal border.  No rub or gallop.  LUNGS:  Clear.  ABDOMEN:  Obese, distended, and nontender.  Good bowel sounds.  EXTREMITIES:  Warm with no cyanosis, clubbing, or edema.  NEUROLOGIC:  He has dysarthria from a previous  stroke.  He is otherwise  alert and oriented x3.  EXTREMITIES:  Moves all 4 extremities without difficulty.  He ambulates  with a cane.   EKG shows sinus rhythm with first-degree AV block at rate of 74.  There  is LVH and mild QT prolongation with a QTC interval of 475 milliseconds.   ASSESSMENT AND PLAN:  1. Congestive heart failure secondary to diastolic dysfunction.      Volume status looks quite good.  He has been intolerant to ACE      INHIBITORS and ARBs due to hypotension and renal insufficiency.  I      will continue current therapy.  2. Hypertension.  Blood pressure is markedly elevated.  He did have      some second-degree heart block in the hospital requiring      discontinuation of his beta-blocker.  I think we can probably start      this back up at a low level and follow him  closely.  We will start      Coreg 3.125 b.i.d.  Should he have progressive heart block, we can      consider him for a pacer.  3. History of atrial fibrillation, maintaining sinus rhythm on      amiodarone.  He will need followup labs looking at his thyroid      panel and liver panel in the next few months.  4. Hyperlipidemia.  Goal LDL is less than 70.  This is followed by Dr.      Jonny Ruiz.   DISPOSITION:  I will see him back in a couple of months for followup.  In the interim, he will follow with the [facility] Failure [facility].     [name]. Bensimhon, MD  Electronically Signed    DRB/MedQ  DD: 02/26/2008  DT: 02/27/2008  Job #: [id number]

## 2010-07-04 NOTE — Discharge Summary (Signed)
[name], [name]              ACCOUNT NO.:  [id number]   MEDICAL RECORD NO.:  [id number]          PATIENT TYPE:  INP   LOCATION:  4733                         FACILITY:  [facility]   PHYSICIAN:  Valerie A. Felicity Coyer, MDDATE OF BIRTH:  1932-03-11   DATE OF ADMISSION:  05/15/2008  DATE OF DISCHARGE:  05/19/2008                               DISCHARGE SUMMARY   DISCHARGE DIAGNOSIS:  1. Dyspnea, multifactorial, see details below, improved.  2. Acute on chronic diastolic heart failure status post diuresis with      improvement, medical management and adjustment per Cardiology, see      details below.  3. Iron-deficiency anemia with heme-positive stool status post      esophagogastroduodenoscopy and colonoscopy (partial) on May 18, 2008, moderate gastritis changes, upper and internal, with external      hemorrhoids lower, continue iron supplement.  4. Type 2 diabetes, uncontrolled, with hemoglobin A1c of 8.1,      medication adjustment as noted below.  5. Hypokalemia due to diuresis and proper replacement to normalize.  6. Paroxysmal atrial fibrillation, maintaining normal sinus on      amiodarone, no anticoagulation due to iron-deficiency anemia with      heme-positive stool.  7. Bradycardia versus Wenckebach, now off beta-blocker and improved.  8. Hypertension, improved with increased hydralazine.  9. Hypothyroidism, continue home Synthroid.  10.Coronary artery disease status post catheterization in 2004,      nonobstructive disease, continue medical management.  11.Chronic kidney disease, stage III, baseline creatinine of 1.7 with      mild acute exacerbation status post diuresis, discharge creatinine      of 2.07 with outpatient followup below.  12.History of stroke in February 2001 with chronic dysarthria, at      baseline.  No physical therapy/occupational therapy needs.  13.Dyslipidemia, continue home meds.  14.History of prostate cancer, stable.   DISCHARGE  MEDICATIONS:  1. Newly prescribed Imdur 30 mg once daily for heart.  2. Zaroxolyn 2.5 mg once daily.  3. Januvia 100 mg once daily.  4. Iron sulfate 325 p.o. b.i.d., over the counter.  The patient is discontinued on his previously prescribed Lasix and  Coreg.  He is, otherwise, to continue as prior to admission the following  medications:  1. Amiodarone 100 mg once daily.  2. Allopurinol 100 mg once daily.  3. Demadex 20 mg tablets 2 tablets p.o. b.i.d.  4. Amaryl 4 mg p.o. b.i.d.  5. Aspirin 325 mg daily.  6. Synthroid 100 mcg once daily.  7. Lipitor 20 mg p.o. nightly.  8. Norvasc 10 mg once daily.  9. Hydralazine 25 mg tablets 3 tablets p.o. t.i.d.  10.Potassium chloride 20 mEq once daily.   DISPOSITION:  The patient is discharged home in medically improved and  stable condition.  Hospital followup is scheduled with primary care  physician, Dr. Oliver Barre, for Monday, May 31, 2008, at 11 a.m.  He  will also follow up with his cardiologist, Dr. Gala Romney, in the next 3  weeks or as instructed.   CONSULTATIONS DURING THIS HOSPITALIZATION:  1. [facility].  2. [facility].   PROCEDURES:  EGD and colonoscopy on May 18, 2008, please see report  for details.   HOSPITAL COURSE BY PROBLEM:  1. Dyspnea:  The patient is a 77 year old gentleman with poorly      controlled heart failure symptoms who came to the emergency room      complaining of increasing shortness of breath.  He had been      recently hospitalized for heart failure on May 05, 2008, through      May 08, 2008, and improved with diuresis.  On evaluation in the      emergency room, chest x-ray was read as pulmonary edema despite the      patient reporting that he was 100% compliant with medications and      diet and he was referred for admission for heart failure.  He was      also found to be significantly anemic with a hemoglobin of 8.9,      which is felt to be contributing to his  dyspnea symptoms.  He was      seen in consultation by Cardiology who assisted with adjustment in      his medical management including the discontinuation of his Coreg      due to bradycardia, which may be contributing to his symptoms, and      adjustment of his antihypertensives and nitro regimen, as well as      clarification of his diuretic regimen.  He was aggressively      diuresed and then returned to an oral regimen, which now included      Zaroxolyn.  Potassium replacement was withheld, and the patient's      dyspnea symptoms were much improved.  Medications at the time of      discharge have been reviewed with the patient and clearly      instructed written clarification on behalf of his daughter who was      apparently managing his medications.  The patient has been noted      multiple times during this hospital service to have outside snacks      and foods scattered on his bed while he sits at the bedside      recliner, thus raise questions of dietary compliance outside the      hospital when he is not even compliant here.  This has been      reviewed with the patient who reports that he does not eat this      junk when he is at home.  2. Iron-deficiency anemia:  An iron study was performed, which showed      the patient to be iron deficient and heme occult was positive for      evidence of GI loss, as such he was seen in consultation by GI.      The patient has not been transfused during this hospitalization.      GI did feel he was appropriate for endoscopic evaluation given heme      positive stool with iron deficiency, and this was performed on      May 18, 2008.  During this hospitalization after volume status      had been improved, per cardiology's adjustments, EGD showed      moderate gastritis which was biopsied to check for neoplasm or H.      pylori, results pending at the time of this dictation.  A      colonoscopy was  begun but not completed due to poor prep but  normal      exam to the hepatic flexure.  There were internal and external      hemorrhoids but no other colonic abnormalities identified.  At the      time of this dictation, the patient was feeling symptomatically      much improved and very anxious for discharge home.  He has had a      mild increase in his creatinine level, presumably due to diuresis      during this hospitalization, but clinically, he appears euvolemic      and would not further hold diuretics or give IV fluids in the      setting of his labile heart failure.  I have arranged for close      outpatient followup with his primary care physician on  May 31, 2008, as described above for recheck of his electrolytes and      creatinine function, as well as overall volume status and review of      medication management.  Please see chart for further details of      this hospitalization.  The      patient is felt to have reached maximum benefit of acute inpatient      hospitalization and is ready for discharge home at this time.      Greater than 30 minutes spent on day of discharge in coordination,      medication review, the patient's exam, and outpatient followup.      Valerie A. Felicity Coyer, MD  Electronically Signed     VAL/MEDQ  D:  05/19/2008  T:  05/19/2008  Job:  [id number]

## 2010-07-04 NOTE — Assessment & Plan Note (Signed)
[facility]                            CARDIOLOGY OFFICE NOTE   JEROD, MCQUAIN                     MRN:          [id number]  DATE:09/16/2007                            DOB:          1932-04-15    PRIMARY CARE PHYSICIAN:  Corwin Levins, MD.   INTERVAL HISTORY:  Mr. Senne is a very pleasant 77 year old male with  a history of diastolic heart failure complicated by cardiorenal  syndrome.  He has a normal ejection fraction with baseline creatinine of  about 2.  He also had a history of previous stroke with residual  dysarthria, atrial fibrillation, maintaining sinus rhythm on amiodarone,  and nonobstructive coronary artery disease by cardiac catheterization.   He has been followed very closely by Dr. Dorian Pod in the [facility].  He has had several admissions for acute on chronic  heart failure.  He recently had some renal insufficiency in his Demadex  was cut back.  He last saw Marcelino Duster in June and his Demadex was  reportedly at 20 b.i.d.  She reinitiated his Avalide as his creatinine  was back down.   Overall, he says he is doing pretty well.  He does note that he is  having some swelling and some abdominal distention.  He does not think  he has much volume on board, though he does have chronic dyspnea.  No  chest pain.   CURRENT MEDICATIONS:  1. Lipitor 20 a day.  2. Amiodarone 200 a day.  3. Glimepiride 4 mg b.i.d.  4. Allopurinol 100 a day.  5. Aspirin 325 a day.  6. Potassium 20 a day.  7. Actos 15 a day.  8. Synthroid 75 mcg a day.  9. Coreg 12.5 b.i.d.  10.Hydralazine 25 t.i.d.  11.Amlodipine 10 a day.  12.Avalide 150/12.5 a day.  13.Demadex 20 just once a day.   PHYSICAL EXAMINATION:  GENERAL:  He is in no acute distress.  He  ambulates in the clinic slowly without any respiratory difficulty.  VITAL SIGNS:  Blood pressure is 138/70, heart rate 63, weight is 264,  which is up 7 pounds.  HEENT:  Normal.  NECK:  Supple.  JVP is about 8 or 9 cm.  Carotids are 2+ bilaterally  without bruits.  There is no lymphadenopathy or thyromegaly.  CARDIAC:  PMI is not palpable.  He has distant heart sounds with an S4.  There is a soft systolic ejection murmur at the right sternal border.  LUNGS:  Clear.  ABDOMEN:  There is marked central obesity with perhaps mild distention.  Unable to evaluate for hepatosplenomegaly.  No bruits or mass.  EXTREMITIES:  Warm with no cyanosis or clubbing.  There is 2+ edema on  the right and 3+ on the left.  NEUROLOGIC:  Alert and oriented x3.  He is dysarthric.  Cranial nerves  II through XII are grossly intact.  Moves all 4 extremities without  difficulty.  Affect is pleasant.   LABORATORY DATA:  From August 08, 2007, showed a sodium 143; potassium  3.5; BUN of 37, which was down from 51;  and creatinine of 1.97, which  was down from 2.1.  BNP was 151, which is up from 70.  EKG shows a sinus  rhythm with first-degree AV block at a rate of 63.  There is nonspecific  ST-T wave abnormalities in one in aVL.   ASSESSMENT/PLAN:  1. Chronic diastolic heart failure.  He has evidence of some mild      volume overload today.  This is complicated by cardiorenal      syndrome.  We will increase his Demadex to 20 b.i.d.  I will have      him follow back up with Dorian Pod in the [facility] Failure      Clinic.  I also reminded him of the need to be more compliant with      his dietary restrictions as he says he has gotten away from this.  2. Hypokalemia.  We will have him take 40 mEq of potassium in the      morning and 20 at night.  3. Hypertension.  Blood pressure is mildly elevated.  We are going up      on his diuretics.  We will watch this closely.  4. Atrial fibrillation.  He is maintaining sinus rhythm on amiodarone.      He refuses Coumadin.  He is on full-dose aspirin.  At the next      visit, it will be time to check his amiodarone surveillance labs      with a TSH and  a liver panel.  He will also need a chest x-ray in      the near future.   DISPOSITION:  We will see him back in clinic in several months.  He will  continue to follow with the Heart Failure Clinic.     [name]. Bensimhon, MD  Electronically Signed    DRB/MedQ  DD: 09/16/2007  DT: 09/17/2007  Job #: [id number]   cc:   Corwin Levins, MD

## 2010-07-04 NOTE — H&P (Signed)
NAMEXZANDER, GILHAM              ACCOUNT NO.:  [id number]   MEDICAL RECORD NO.:  [id number]          PATIENT TYPE:  INP   LOCATION:  4729                         FACILITY:  [facility]   PHYSICIAN:  Noralyn Pick. Eden Emms, MD, FACCDATE OF BIRTH:  [birth year]   DATE OF ADMISSION:  05/05/2008  DATE OF DISCHARGE:                              HISTORY & PHYSICAL   PRIMARY CARDIOLOGIST:  [name]. Bensimhon, MD   PRIMARY CARE [place] Mcgrory:  Corwin Levins, MD   PATIENT PROFILE:  A 77 year old African American male with prior history  of diastolic heart failure who presents with acute-on-chronic heart  failure.   PROBLEMS:  1. Acute-on-chronic diastolic congestive heart failure/nonischemic      cardiomyopathy.      a.     On June 14, 2007, a 2-D echocardiogram, ejection fraction       60%, probable mid and apical left ventricular cavity obliteration       in systole.  Marked left ventricular hypertrophy.  2. Previous history of dilated cardiomyopathy with documented ejection      fraction of 35-45% in April 2003.  3. Nonobstructive coronary artery disease.      a.     Cardiac catheterization on February 02, 2003, performed by       Dr. Sharyn Lull showing no significant stenosis.      b.     On February 03, 2003, Myoview showing fixed inferior defect       with no ischemia, ejection fraction of 37%.  Last Myoview was       reported to be in 2008 with an ejection fraction of 49%, no       ischemia.  4. Paroxysmal atrial fibrillation, on amiodarone, thought to be a poor      Coumadin candidate.  5. Hypertension.  6. Hyperlipidemia.  7. Diabetes mellitus.  8. History of cerebrovascular accident.  9. History of Mobitz II on higher dose beta-blocker.  Currently,      tolerating low dose beta-blocker.  10.Chronic kidney disease with creatinines ranging from 1.4-2.9 over      the past year.  11.History of tobacco and alcohol abuse.  12.History of noncompliance.  13.History of prostate cancer.   HISTORY  OF PRESENT ILLNESS:  A 77 year old African American male with  the above problem list.  Approximately 5-6 days ago, he began to  experience progressive dyspnea on exertion, orthopnea, increasing  abdominal girth and lower extremity edema.  He has been sleeping in a  recliner.  Symptoms have progressed and he is now dyspneic at rest.  Very early this morning, he asked the son-in-law to call EMS and he was  taken to the [facility] ED.  Here, his BNP was elevated at 342 and his  chest x-ray is consistent with heart failure.  He has received IV Lasix  in ED and has had 400 mL out so far.  Of note upon EMS arrival, his  blood pressure was 198/98 at home.  Blood pressure is currently 147/76.   ALLERGIES:  ACE INHIBITORS and ARBs have caused renal failure in the  past.  HOME MEDICATIONS:  1. Amiodarone 100 mg daily.  2. Allopurinol 100 mg daily.  3. Amaryl 4 mg b.i.d.  4. Aspirin 325 mg daily.  5. Synthroid 75 mcg daily.  6. Lipitor 20 mg daily.  7. Norvasc 10 mg daily.  8. Potassium chloride 20 mEq t.i.d.  9. Hydralazine 50 mg t.i.d.  10.Torsemide 40 mg in the a.m., 20 mg in the p.m.  11.Coreg 3.125 mg b.i.d.   FAMILY HISTORY:  Mother is alive at age 67.  There is no history of  coronary artery disease.  Father died at 24, he is not sure of what.  He  has a sister and he is unclear with regards to her health history.   SOCIAL HISTORY:  He lives in [place] by himself, does not currently  work.  He has about a 75-pack-year history of tobacco abuse, quitting  about 10 years ago.  Previously drank heavily, quitting about 10 years  ago.  He denies drug use.  He does not routinely exercise.   REVIEW OF SYSTEMS:  Positive for fatigue with decreased exercise  tolerance, dyspnea with minimal exertion, orthopnea, lower extremity  edema, history of diabetes, progressive abdominal distention and girth,  weakness.  Otherwise, all systems reviewed negative.  He is a full code.   PHYSICAL  EXAMINATION:  VITAL SIGNS:  Temperature 97.7, heart rate 68,  respirations 18, blood pressure currently 147/76, although previously  198/98 by EMS record, and pulse ox 99% on 4 L.  GENERAL:  Pleasant African American male in no acute distress, awake,  alert, and oriented x3.  HEENT:  Normal.  NEUROLOGIC:  Grossly intact and nonfocal.  SKIN:  Warm and dry without lesions or masses.  MUSCULOSKELETAL:  Normal without deformity or effusion.  PSYCHIATRIC:  Normal affect.  NECK:  JVP to jaw.  No bruits.  LUNGS:  Respirations are regular and unlabored with right greater than  left crackles at the bases and also diffuse expiratory wheezing.  CARDIAC:  Regular.  Distant S1 and S2.  Positive S4 and a 2/6 systolic  ejection murmur at the right upper sternal border.  ABDOMEN:  Obese, distended.  Bowel sounds present x4.  EXTREMITIES:  Warm, dry with 2+ bilateral lower extremity edema to the  knee.  Dorsalis pedis, posterior tibial pulses are 2+ and equal  bilaterally.  There is no cyanosis.  The patient is noted to have  clubbing of nails of bilateral upper and lower extremities.   Chest x-ray shows CHF.  EKG shows sinus rhythm with T-wave inversion in  I, aVL and ST flattening of V5 to V6, left axis deviation with a rate of  70 beats per minute.  None of these changes are new.   LABORATORY WORK:  Hemoglobin 9.9, hematocrit 29, WBC 9.1, platelets 248.  Sodium 142, potassium 4.0, chloride 107, CO2 of 26, BUN 26, creatinine  1.9, glucose 228.  BNP 342.  CK-MB 1.9, troponin I less than 0.5.   ASSESSMENT AND PLAN:  1. Acute-on-chronic diastolic congestive heart failure.  The patient      has about 5-6 day history of increased orthopnea, lower extremity      edema, as well as increasing abdominal girth and dyspnea on      exertion.  He reports medication compliance, although he does not      weigh himself daily and does not appear to watch his diet.  His BNP      is mildly elevated, although he  appears to have  marked volume      overload.  His blood pressure was very high at home, slightly      contributed to his acute diastolic heart failure.  Heart rate was      controlled.  Plan to cycle enzymes and add IV Lasix.  Continue beta-      blocker, hydralazine, and add nitrate.  No ACE or ARB secondary to      history of renal insufficiency.  He needs CHF education as he does      not weigh daily.  2. Hypertension.  Blood pressure is markedly elevated at home.  It is      currently better.  We will follow with diuresis.  3. Diabetes mellitus.  Add sliding scale insulin.  Continue Amaryl.  4. Anemia.  Check iron studies, black stools.  This is likely stable.  5. Hypothyroidism.  Continue Synthroid.  Check TFTs.  6. Hyperlipidemia.  Check lipid panel and LFTs.  Continue statin.  7. Chronic kidney disease.  Follow.  Appears baseline.  Has varied      quite a bit from a low of 1.4 to a high of 2.9 over the past year.      Currently is 1.9.      Nicolasa Ducking, ANP      Noralyn Pick. Eden Emms, MD, [facility]  Electronically Signed    CB/MEDQ  D:  05/05/2008  T:  05/06/2008  Job:  [id number]

## 2010-07-04 NOTE — Assessment & Plan Note (Signed)
[facility]                            CARDIOLOGY OFFICE NOTE   [name], [name]                     MRN:          [id number]  DATE:12/18/2007                            DOB:          09-Feb-1933    PRIMARY CARE PHYSICIAN:  Corwin Levins, MD   INTERVAL HISTORY:  [name] is a delightful 77 year old male with a  history of a diastolic heart failure with nonobstructive coronary artery  disease by catheterization.  He also has chronic renal insufficiency  with cardiorenal syndrome and history of atrial fibrillation,  maintaining sinus rhythm with amiodarone.  He has also had a previous  stroke.   He is here today with his daughter for routine followup.  We have not  seen him in almost 3 months.  He has previously been followed closely in  the Heart Failure Clinic for significant volume overload.   Overall, he states he feels great, but he just states he has a hard time  walking and this is really more due to orthopedic issues and his stroke,  and not dyspnea.  He continues to have chronic severe edema, but no  orthopnea, no PND, no chest pain.  He has been compliant with his  medications.   CURRENT MEDICATIONS:  1. Demadex 20 b.i.d.  2. Potassium 40/20.  3. Lipitor 20 a day.  4. Amiodarone 100 a day.  5. Glimepiride 4 b.i.d.  6. Allopurinol 100 a day.  7. Aspirin 325 a day.  8. Actos 15 a day.  9. Levothyroxine 75 mcg a day.  10.Coreg 12.5 b.i.d.  11.Hydralazine 25 t.i.d.  12.Amlodipine 10 mg a day.   Previously, he was on Avalide, but apparently, he was unable to tolerate  this due to his cardiorenal syndrome.  I am not sure of the exact  details.   PHYSICAL EXAMINATION:  GENERAL:  He ambulates around the clinic with his  cane slowly, but no respiratory difficulty.  VITAL SIGNS:  Blood pressure is 140/66, heart rate is 64, and weight is  266.  HEENT:  He has a mild facial droop.  NECK:  Supple and thick.  JVP appears to be 8-10 cm of  water.  There is  no thyromegaly or lymphadenopathy.  CARDIAC:  PMI is not palpable.  He is regular with an S4.  There is a  2/6 systolic ejection murmur at the left sternal border that increases  with inspiration.  LUNGS:  Clear.  ABDOMEN:  Markedly distended.  Obese, nontender.  Good bowel sounds.  Unable to appreciate any hepatosplenomegaly.  EXTREMITIES:  Warm with no cyanosis or clubbing.  There is 2-3+ edema,  left greater than right.  No rash.  No skin breakdown.  NEURO:  Alert and oriented x3.  His speech is slurred due to his  previous stroke.  Moves all 4 extremities without difficulty.   ASSESSMENT AND PLAN:  1. Congestive heart failure secondary to diastolic dysfunction.  He      has got significant fluid overload.  This is complicated by      cardiorenal syndrome.  He has been unable tolerate  angiotensin-      converting enzyme inhibitors or angiotensin II receptor blockers in      the past apparently.  We will go ahead and increase his Demadex to      40 in the morning and 20 in the afternoon.  I have also took the      liberty to stop his Actos.  I have asked him to follow his sugars      closely and get back to Dr. Jonny Ruiz if they are drifting up.  He will      see Dorian Pod in the Heart Failure Clinic next week with a      BMET.  2. Hypertension.  Blood pressure is mildly elevated.  We are      increasing his diuretics.  I did consider the possible addition of      spironolactone, but was hesitant given his cardiorenal syndrome.      We will continue to follow this.  May consider increasing his      hydralazine in the future.     [name]. Bensimhon, MD  Electronically Signed    DRB/MedQ  DD: 12/18/2007  DT: 12/18/2007  Job #: [id number]

## 2010-07-04 NOTE — Assessment & Plan Note (Signed)
[facility]                            CARDIOLOGY OFFICE NOTE   [name], [name]                     MRN:          [id number]  DATE:01/06/2007                            DOB:          Apr 14, 1932    INTERVAL HISTORY:  [name] is a very pleasant 77 year old male with  multiple medical problems including chronic heart failure due to mild to  moderate nonischemic cardiomyopathy.  Ejection fraction previously 35%  to 45%.  He also has nonobstructive coronary artery disease, diabetes,  hypertension, hyperlipidemia, obesity, and atrial fibrillation for which  he is taking amiodarone for.   We saw him for the first time last month.  At that time, he had some  lower extremity edema and we increased his Lasix.  Unfortunately, he  developed some renal insufficiency, and we cut his Lasix back.  Unfortunately, he had misunderstood and has stopped it completely.  He  said overall he feels okay.  He denies any chest pain.  He does have  some lower extremity edema, but no orthopnea or PND.  He gets around  slowly.   CURRENT MEDICATIONS:  1. Lipitor 20 mg a day.  2. Amiodarone 200 a day.  3. BiDil 1 tablet t.i.d.  4. Glimepiride 4 b.i.d.  5. Allopurinol 100 a day.  6. Aspirin 325 a day.  7. Avalide 300 mg/12.5 mg a day.  8. Potassium 20 a day.  9. Clonidine 0.1 b.i.d.  10.Synthroid 50 mcg a day.  11.Actos 15 mg a day.  12.Ramipril 10 b.i.d.  13.Coreg 25 b.i.d.  14.Norvasc 5 a day.   PHYSICAL EXAM:  He is an elderly male in no acute distress.  He  ambulates around the clinic slowly.  There is no respiratory difficulty.  Blood pressure 148/70, heart rate 72, weight 258.  HEENT:  Normal.  NECK:  Supple and thick.  It is hard to see his JVD.  Looks mildly  elevated.  Carotids are 2+ bilaterally.  No bruits.  There is no  lymphadenopathy or thyromegaly.  CARDIAC:  PMI is nondisplaced.  There is a regular rate and rhythm with  an S4.  No S3.  A 2/6  systolic ejection murmur at the right sternal  border.  LUNGS:  Clear.  ABDOMEN:  Marked central obesity.  There is no hepatosplenomegaly.  No  bruits.  No masses.  Good bowel sounds.  EXTREMITIES:  Warm with no cyanosis or clubbing.  There is 1 to 2+ edema  bilaterally.  No rash.  NEURO:  He is alert and oriented x3.  Cranial nerves II-XII are intact.  He moves all 4 extremities without difficulty.  Affect is pleasant.   Echocardiogram shows ejection fraction of 55% to 65%.  There is severe  concentric LVH with evidence of diagnostic dysfunction, elevated LV  filling pressures.  There was mid-cavity obliteration.   Adenosine Myoview showed an EF of 49%, however, visually the LV function  looked better.  There is no ischemia.   ASSESSMENT AND PLAN:  1. Congestive heart failure secondary to primarily diastolic      dysfunction.  He  appears to have some mild volume on-board.  We      will ask him to restart his Lasix at 40 b.i.d., and watch his      creatinine very closely.  2. Hypertension.  Blood pressure is elevated today.  We have increased      his Norvasc to 10 a day.  Should his blood pressure remain      elevated, we can consider adding spironolactone.  3. History of atrial fibrillation.  He is maintaining sinus rhythm      with amiodarone.  He will need surveillance labs.  We have had a      long discuss with him and his daughter about Coumadin versus      aspirin.  We will keep him on aspirin at this time, as he is not      wanting to do Coumadin.   DISPOSITION:  We will see him back in a few weeks for routine followup.     [name]. Bensimhon, MD  Electronically Signed    DRB/MedQ  DD: 01/06/2007  DT: 01/07/2007  Job #: [id number]

## 2010-07-04 NOTE — Assessment & Plan Note (Signed)
[facility]                          CHRONIC HEART FAILURE NOTE   [name], [name]                     MRN:          [id number]  DATE:01/22/2008                            DOB:          [birth year]    PRIMARY CARDIOLOGIST:  [name]. Bensimhon, MD   PRIMARY CARE PHYSICIAN:  Corwin Levins, MD   [name] returns today for further followup of his congestive heart  failure, which is secondary to diastolic dysfunction with nonobstructive  coronary artery disease confirmed by cardiac catheterization.  I last  saw [name] on January 06, 2008, at which time I found him to be in  volume overload with previous refusal to be admitted.  He was agreeable  at that time as his symptoms have become so progressive with ongoing  orthopnea and edema.  He diuresed nicely during hospitalization.  He did  experience some episodes of second-degree heart block, prompting  discontinuation of his beta-blocker therapy.  At that time, he also has  intolerance to ACE INHIBITORS and ARB secondary to extreme hypotension  and worsening renal insufficiency.  [name] states he has been doing  quite well since he is discharged home.  His weight is maintained at  home without significant fluid retention.  He denies any lightheadedness  or dizziness.  His daughter, [name], accompanies him today.  She states  he has been doing quite well, ambulating with assistance of a cane.  I  arranged for home health PT to work with him at home, increase  strengthening.  He is compliant with his medications.   PAST MEDICAL HISTORY:  1. Congestive heart failure secondary to diastolic dysfunction with a      normal ejection fraction by echocardiogram in April 2009.  2. New onset of Mobitz type 2 heart block during recent      hospitalization, prompting discontinuation of beta-blocker therapy.  3. Intolerance to ACE INHIBITORS and ARB secondary to extreme      hypotension and renal  insufficiency.  4. Previous CVA.  5. Chronic renal insufficiency.  6. History of medical noncompliance.  7. History of EtOH and tobacco abuse.  8. History of paroxysmal atrial fib, requiring amiodarone therapy.  9. Diabetes type 2.  10.Dyslipidemia.   REVIEW OF SYSTEMS:  As stated above, otherwise negative.   CURRENT MEDICATIONS:  1. KCl 20 mEq t.i.d.  2. Lipitor 20.  3. Amiodarone 100 daily.  4. Glimepiride 4 mg b.i.d.  5. Allopurinol 100 daily.  6. Aspirin 325.  7. Levothyroxine 75 mcg daily.  8. Carvedilol was discontinued.  9. Hydralazine 25 mg has been increased to 50 mg t.i.d. during recent      hospitalization.  10.Torsemide 20 mg in the morning and 40 in the afternoon, this is      what works best for the patient schedule.   PHYSICAL EXAMINATION:  VITAL SIGNS:  Weight 257 pounds, weight is down 8  pounds.  Blood pressure 163/77 with a heart rate of 79.  GENERAL:  [name] is in no acute distress at this time.  NECK;  No signs of jugular  vein distention.  LUNGS:  Clear to auscultation.  CARDIOVASCULAR:  S1 and S2.  Regular rate and rhythm with a 2/6 systolic  ejection murmur noted.  ABDOMEN:  Obese, soft, and nontender.  Positive bowel sounds.  LOWER EXTREMITIES:  Without clubbing, cyanosis, or edema.  NEUROLOGICAL:  Alert and oriented x3.  Slurred speech from previous  stroke.  Ambulating with assistance of a cane.   IMPRESSION:  Congestive heart failure secondary to diastolic dysfunction  with a normal ejection fraction.  The patient's fluid volume status  appears to be stable at this time.  We will plan on following up with  him in a few weeks for re-evaluation.  [name] knows to call me if he has  any problems before then.  The patient will need repeat labs.      Dorian Pod, ACNP  Electronically Signed      Rollene Rotunda, MD, [facility]  Electronically Signed   MB/MedQ  DD: 01/22/2008  DT: 01/23/2008  Job #: [id number]

## 2010-07-04 NOTE — Assessment & Plan Note (Signed)
Stop kcl. Recheck BMET 1 week.

## 2010-07-04 NOTE — Assessment & Plan Note (Signed)
[facility] HEALTHCARE                            CARDIOLOGY OFFICE NOTE   KAYLIB, FURNESS                     MRN:          [id number]  DATE:12/12/2006                            DOB:          07-Sep-1932    REASON FOR REFERRAL:  Heart failure.   HISTORY OF PRESENT ILLNESS:  [name] is a very pleasant 77 year old  male with multiple medical problems including diabetes, hypertension,  hyperlipidemia, obesity, and atrial fibrillation for which he has been  maintained on amiodarone.  He also has significant history of congestive  heart failure.  He was previously followed by Dr. Sharyn Lull but is now  interested in switching physicians.  He underwent cardiac catheterization in December 2004, which showed  moderate nonobstructive coronary artery disease.  The LAD had a 50-60%  stenosis and the circumflex had a 20-30% stenosis and the RCA had a 50-  60% proximal stenosis and a 40% mid stenosis.  The renal arteries were  widely patent.  EF was about 50%.  Given the questionable nature of the  LAD lesion, he underwent a Myoview which showed a fixed inferior defect  and an ejection fraction of 37% with a dilated myocardium.  He did have  an echocardiogram a year before that in 2003, which showed an ejection  fraction of 35-45% in the setting of respiratory failure.  He presents today to establish long term care.  He denies any recent  chest pain.  He said about a week or two ago he had significant problems  with his breathing but now he feels better, though not completely  better.  He says he is able to walk 100 yards before he gets short of  breath.  He has had a moderate amount of lower extremity edema.  Apparently, Dr. Sharyn Lull had told him to stop his Lasix but the daughter  just decreased it from 80 b.i.d. to 80 once a day.  He denies orthopnea  or PND but when I asked him directly he says he sleeps in a recliner.  He denies any snoring.  He has not had any  palpitations, no syncope or  presyncope.   REVIEW OF SYSTEMS:  Notable for arthritis as well as urinary tract  problems, sexual dysfunction, gout, anxiety, fatigue, constipation,  allergies, and peptic ulcer disease.  The remainder of the review of  systems is negative except for as mentioned in the HPI and problem list.   PROBLEM LIST:  1. Moderate nonobstructive coronary artery disease by cath in December      2004.  2. Congestive heart failure due to both systolic and diastolic heart      failure.  Ejection fraction is different in various modalities but      probably somewhere around 40%.  3. Diabetes.  4. Hypertension.  5. Hyperlipidemia.  6. Obesity.  7. Gout.  8. COPD.  9. Atrial fibrillation maintained in a sinus rhythm on amiodarone.  10.History of prostate cancer.  11.Remote history of alcohol abuse.  12.History of CVA.   CURRENT MEDICATIONS:  1. Lipitor 20 mg a day.  2. Amiodarone 200  a day.  3. BiDil, looks like one tablet 3 times a day.  4. Glimepiride 4 mg b.i.d.  5. Allopurinol 100 a day.  6. Aspirin 81 a day.  7. Avalide 300/12.5 mg a day.  8. Potassium 20 a day.  9. Clonidine 0.1 b.i.d.  10.Synthroid 50 mcg a day.  11.Actos 15 a day.  12.Ramipril 10 b.i.d.  13.Carvedilol 25 b.i.d.  14.Lasix 80 once a day.  15.Norvasc 5 a day.   SOCIAL HISTORY:  He is single.  He has 6 children.  He is retired.  He  was formerly a Education administrator at [employer].  History of tobacco use but  quit in 2006.  History of alcohol abuse but apparently drinks very  little now.   FAMILY HISTORY:  Mother is alive at 53 with father died in 59, unknown  cause.  He has 3 sisters who alive and well.  His father did have  premature coronary artery disease at the age of 39.   PHYSICAL EXAMINATION:  GENERAL:  He is an elderly male who walks around  the office slowly with a cane.  He has very hoarse voice.  VITAL SIGNS:  Blood pressure is 156/78, heart rate is 55, weight is 257.   HEENT:  Normal.  NECK:  Supple and thick.  It is hard to see his JVD but is appears  elevated to the ear.  Carotids are 2 plus bilaterally without any  bruits.  There is no lymphadenopathy or thyromegaly.  CARDIAC:  He has a regular rate and rhythm with an S4.  No S3.  There is  a 2/6 systolic ejection murmur at the right sternal border.  PMI is  laterally displaced.  LUNGS:  Clear.  ABDOMEN:  Has marked central obesity.  Unable to appreciate any  hepatosplenomegaly, any bruits.  No masses.  He is nontender.  EXTREMITIES:  Warm.  No cyanosis or clubbing.  There is at least 2 plus  edema bilaterally.  No rash.  NEUROLOGIC:  He is alert and oriented x3.  Cranial nerves II-XII are  grossly intact.  He moves all 4 extremities without difficulty.  Affect  is pleasant.   EKG shows a sinus bradycardia with some sinus arrhythmia.  He has a 1st  degree AV block at 238 milliseconds.  There is nonspecific ST-T wave  abnormalities.  QRS duration is 100 milliseconds.   ASSESSMENT/PLAN:  1. Congestive heart failure secondary to both systolic and diastolic      heart failure.  He has some evidence of fluid overload.  We will      increase his Lasix back up to 80 twice a day.  We will check a BNP      and other labs today as well as repeat his B-MET in 2 weeks to make      sure he is not being over diuresed.  He will probably need to be      enrolled in the [facility] Failure [facility].  We will get an      echocardiogram and place compression stockings on him.  2. Coronary artery disease.  This was moderate by catheterization in      2004.  We will proceed with a Myoview to re-evaluate for any      ischemic defects.  3. Hypertension, suboptimally controlled.  I will increase his Norvasc      to 10 a day.  4. Hyperlipidemia.  This is followed by Dr. Jonny Ruiz.  Given his diabetes  and coronary artery disease, he probably needs to be on a Statin      with an LDL less than 70.  5. Atrial fibrillation.  He  is maintaining a sinus rhythm on      amiodarone.  He will need surveillance labs including a thyroid      panel and at some point a chest x-ray and PFTs.  He really should      be on Coumadin given his very high [place] score but this is something      we will need to address with him in the future.  For now, we will      increase his aspirin to 325.  6. Possible obstructive sleep apnea, although he denies snoring.  His      refractory blood pressure and body habitus make me very concerned      for sleep apnea.  As we get to know him better, we will consider a      sleep study.     [name]. Bensimhon, MD  Electronically Signed    DRB/MedQ  DD: 12/12/2006  DT: 12/13/2006  Job #: [id number]

## 2010-07-04 NOTE — Assessment & Plan Note (Signed)
[name] HEALTHCARE                            CARDIOLOGY OFFICE NOTE   [name], [name]                     MRN:          [id number]  DATE:01/31/2007                            DOB:          [birth year]    PRIMARY CARE PHYSICIAN:  Unknown.   INTERVAL HISTORY:  [name] is a very pleasant, 77 year old male with  multiple medical problems including congestive heart failure. Previously  he had evidence of systolic dysfunction with an EF of 35-45% however,  most recent echocardiogram shows an EF of 55-65% with severe concentric  LVH and evidence of diastolic dysfunction. He also has a history of  nonobstructive coronary artery disease, diabetes, hypertension,  hyperlipidemia, obesity, and atrial fibrillation for which he is on  amiodarone.   He returns today for routine followup. We had previously increased his  Lasix but he developed some renal insufficiency and we bumped this back  down. He says overall he feels pretty good. He does get short of breath  just with mild to moderate activity, he does not have any chest pain. He  feels like his edema is somewhat better. He denies orthopnea or PND.   MEDICATIONS:  1. Lipitor 20 a day.  2. Amiodarone 200 a day.  3. BiDil 1 tablet t.i.d.  4. Glimepiride 4 b.i.d.  5. Allopurinol 100 a day.  6. Aspirin 325 a day.  7. Avalide 300/12.5 a day.  8. Potassium 20 a day.  9. Clonidine 0.1 b.i.d.  10.Synthroid 50 mcg a day.  11.Actos 15 mg a day.  12.Ramipril 10 b.i.d.  13.Carvedilol 25 b.i.d.  14.Norvasc 5 a day.  15.Lasix 40 b.i.d.   PHYSICAL EXAMINATION:  GENERAL:  He is an elderly male who walks around  the office slowly without any respiratory difficulty.  VITAL SIGNS:  Blood pressure is 138/76, heart rate is 74, weight is 258  sating 92% on room air.  HEENT:  Normal.  NECK:  Supple and thick. JVP is about 7-8 cmH2O. Carotids are 2+  bilaterally without any bruits. There is no lymphadenopathy or  thyromegaly.  CARDIAC:  His PMI is laterally displaced. He has distant heart sounds  with an S4. There is a 2/6 systolic ejection murmur at the right sternal  border.  LUNGS:  Clear.  ABDOMEN:  Obese, nontender, nondistended. There is no obvious  hepatosplenomegaly, no bruits, no masses.  EXTREMITIES:  Warm with no cyanosis or clubbing. There is 1 to 2+ edema  bilaterally but this is decreased from previous.  NEUROLOGIC:  Alert and oriented x3. He appears somewhat dysarthric.  Cranial nerves II-XII are grossly intact. Moves all 4 extremities  without difficulty.  Affect is pleasant.   LABORATORY DATA:  Creatinine of 2.0 with a BUN of 41 which is stable.  Sodium is 143, potassium is 3.8. BNP is down to 97 and previously was  209.   ASSESSMENT/PLAN:  1. Congestive heart failure secondary to diastolic dysfunction. Volume      status looks better today. Will continue current therapy.  2. Hypertension. Blood pressure is elevated. Increase his clonidine to  0.2 b.i.d.  3. Atrial fibrillation. Appears to be maintaining a normal rhythm on      amiodarone. We will check liver function tests, thyroid tests, and      a chest x-ray to make sure history surveillance screening is okay.      He refuses Coumadin.   DISPOSITION:  Will see him back in several months for routine followup.     [name]. Bensimhon, MD  Electronically Signed    DRB/MedQ  DD: 01/31/2007  DT: 02/02/2007  Job #: [id number]

## 2010-07-04 NOTE — Assessment & Plan Note (Signed)
[facility]                          CHRONIC HEART FAILURE NOTE   WARREN, LINDAHL                     MRN:          [id number]  DATE:01/06/2008                            DOB:          Dec 03, 1932    PRIMARY CARE PHYSICIAN:  Dr. Oliver Barre.   PRIMARY CARDIOLOGIST:  Dr. Nicholes Mango.   Mr. Torpey returns today for further followup of his congestive heart  failure secondary to diastolic dysfunction complicated by cardiorenal  syndrome.  I last saw Mr. Bogdan last week at which time I found him to  have significant volume overload with the patient adamantly refusing to  be admitted.  I increased his Demadex from 20 b.i.d. to 40 and 20.  He  returns today for followup with no resolution in volume overload.  He is  complaining of being orthopneic, sleeping in the recliner, abdominal  fullness and lower extremity edema.  He initially refused to be admitted  today.  After further discussion with him and his son, he understands  that the p.o. medication is not as effective with so much abdominal  fluid and he is agreeable to be admitted for IV diuresis, as long as he  can be home and for Thanksgiving.  He currently is in no acute distress.   PAST MEDICAL HISTORY:  1. Includes congestive heart failure secondary to his diastolic      dysfunction with a normal EF.  2. Nonobstructive CAD by cath.  3. Atrial fib with amiodarone therapy.  4. Cardiorenal syndrome with intolerance to ACE inhibitors or ARB.      Significant bump in creatinine each time an ACE or ARB has been      initiated.  5. Medical noncompliance.  6. History of EtOH abuse.  7. Previous right brain CVA.  8. Degenerative joint disease.  9. Dyslipidemia.  10.BPH.  11.Diverticulosis.  12.History of tobacco use.  13.Diabetes type 2.   ALLERGIES:  No known drug allergies.   CURRENT MEDICATIONS:  1. Klor-Con 20 mEq t.i.d.  2. Lipitor 20.  3. Amiodarone 100 mg daily.  4.  Glimepiride 4 mg b.i.d.  5. Allopurinol 100 daily.  6. Aspirin 325.  7. Levothyroxine 75 mcg daily.  8. Carvedilol 12.5 mg b.i.d.  9. Hydralazine 25 mg t.i.d.  10.Amlodipine 10 mg daily.  11.Torsemide 20 in the morning 40 in the afternoon.  (This schedule      works best for the patient.)   SOCIAL HISTORY:  Previous tobacco and EtOH abuse.  Currently denies any.  Lives with family.   REVIEW OF SYSTEMS:  As stated above.   PHYSICAL EXAM:  VITAL SIGNS:  Weight today is 265 pounds weight up 2  pounds from 1 week ago, blood pressure 145/69 with a heart rate of 69.  CONSTITUTIONAL:  Mr. Shirer is in no acute distress.  NECK:  JVD is around 10-12 cm at 90 degrees angle with noted prominence.  CARDIOVASCULAR:  Reveals S1, S2, positive S4.  Has a soft systolic  ejection murmur noted.  LUNGS:  Relatively clear throughout.  ABDOMEN:  Markedly obese with distention.  Unable  to evaluate for  hepatomegaly.  Positive bowel sounds.  LOWER EXTREMITIES:  Without clubbing or cyanosis.  He has +2 pitting  edema bilaterally.  NEUROLOGICAL:  Alert and oriented x3.  Dysarthric.  Ambulates with  assistance of a cane.  Affect is pleasant, cooperative at this time.   IMPRESSION:  Congestive heart failure secondary to diastolic dysfunction  with significant volume overload.  No response to p.o. Demadex.  The  patient will be admitted for IV diuresis.  Will continue home  medications with the exception of his Norvasc.  Would like to hold on  calcium channel blocker and bump his carvedilol.  Will be admitting the  patient to telemetry.  Dr. Rollene Rotunda has examined and assessed the  patient and agrees with plan of care.      Dorian Pod, ACNP  Electronically Signed      Rollene Rotunda, MD, [facility]  Electronically Signed   MB/MedQ  DD: 01/06/2008  DT: 01/06/2008  Job #: [id number]

## 2010-07-04 NOTE — Consult Note (Signed)
NAMEBROADY, [name]              ACCOUNT NO.:  [id number]   MEDICAL RECORD NO.:  [id number]          PATIENT TYPE:  INP   LOCATION:  4733                         FACILITY:  [facility]   PHYSICIAN:  Jordan Hawks. Elnoria Howard, MD    DATE OF BIRTH:  12-29-1932   DATE OF CONSULTATION:  DATE OF DISCHARGE:                                 CONSULTATION   REASON FOR CONSULTATION:  Iron-deficiency anemia.   HISTORY OF PRESENT ILLNESS:  This is a 77 year old gentleman with a past  medical history of diastolic CHF, iron-deficiency anemia, paroxysmal  atrial fibrillation, coronary artery disease, hyperlipidemia,  hypertension, diabetes, status post CVA, Mobitz II versus Wenckebach  heart block, history of renal insufficiency, and status post prostate  cancer, who was admitted to the hospital with complaints of shortness of  breath.  The patient was recently admitted from May 05, 2008 through  May 08, 2008 with complaints of shortness of breath.  At that time, he  was diuresed and had an excellent response and also his weight declined  by approximately 6 pounds.  The patient was then discharged home with  medications and subsequently, the patient developed shortness of breath  again.  He states that he has been compliant with his medications, but  several days prior to this recent admission, he started having an  increase in his shortness of breath and also peripheral edema.  Upon  admission, the patient was found to have a mildly elevated BNP at 309.  A chest x-ray did reveal pulmonary edema again.  Because of these type  of findings, the patient was admitted and during the workup of his  anemia, the patient was noted to have a mild anemia at 8.9.  Additionally, his iron saturation is at 7% and his ferritin is at 40.  With these type of findings, a GI consultation was requested.  The  patient denies having any prior history of melena or hematochezia.  No  known family history of colon cancer.  The patient  denies any  constipation, diarrhea, or abdominal pain.   PAST MEDICAL AND SURGICAL HISTORY:  As stated above.   FAMILY HISTORY:  Noncontributory.   SOCIAL HISTORY:  Negative for any current alcohol, tobacco use, or  illicit drug use.   REVIEW OF SYSTEMS:  As stated above in the history present illness,  otherwise negative.   MEDICATIONS:  1. Allopurinol 100 mg 1 p.o. daily.  2. Amlodipine 100 mg p.o. daily.  3. Norvasc 10 mg p.o. daily.  4. Aspirin 325 mg p.o. daily.  5. Iron 325 mg p.o. b.i.d.  6. Lasix 40 mg IV b.i.d.  7. Hydralazine 75 mg p.o. t.i.d.  8. Isosorbide 30 mg p.o. daily.  9. Levothyroxine 100 mcg p.o. daily.  10.Metolazone 2.5 mg p.o. daily.  11.Potassium chloride 40 mEq p.o. t.i.d.  12.Januvia 100 mg p.o. daily.  13.Tylenol 650 mg p.o. q.6 h. p.r.n.   ALLERGIES:  No known drug allergies.   PHYSICAL EXAMINATION:  VITAL SIGNS:  Blood pressure is 150/82, heart  rate is 71, respirations 18, temperature is 97.7.  GENERAL:  The patient  is in no acute distress, alert and oriented.  HEENT:  Normocephalic, atraumatic.  Extraocular muscles intact.  NECK:  Supple.  No lymphadenopathy.  LUNGS:  Clear to auscultation bilaterally, although he did have some  distant lung sounds.  CARDIAC:  Regular rate and rhythm.  ABDOMEN:  Obese, soft, nontender, and nondistended.  Positive bowel  sounds.  EXTREMITIES:  No clubbing, cyanosis, or edema.   LABORATORY VALUES:  White blood cell count is 7.5, hemoglobin 8.9, MCV  is 80.7, and platelets at 338.  Sodium is 141, potassium is 3.4,  chloride is 108, CO2 26, glucose 233, BUN 28, and creatinine is 1.7.  BNP is 309.  Iron saturation is 7%, ferritin is at 40.   IMPRESSION:  1. Iron-deficiency anemia.  2. Heme-positive stool.  3. Multiple other medical problems.   The patient requires further evaluation at this time.  I did not perform  rectal examination because he just had a bowel movement and I requested  the patient to  leave it in the toilet.  Upon evaluation of the stool, it  was brown and solid and no evidence of any hematochezia or melena.  However, it was noted to be heme-positive.  Because of these type of  findings, the patient requires further evaluation.  Apparently, his  hemoglobin has dropped down from the 12 range down into his current 8.9  over the past 2 years.  He denies having workup in the past for his  anemia.   The plan will be to perform an EGD and colonoscopy in the next 1-2 days.  The patient is not at his baseline.  He does state that he feels much  better, but I am concerned about pursuing an EGD and colonoscopy in the  face of his current CHF.  I feel that once he is at a better baseline,  then these procedures can be performed.      Jordan Hawks Elnoria Howard, MD  Electronically Signed     PDH/MEDQ  D:  05/16/2008  T:  05/17/2008  Job:  [id number]   cc:   Willow Ora, MD  [facility]

## 2010-07-04 NOTE — Assessment & Plan Note (Signed)
[facility]                          CHRONIC HEART FAILURE NOTE   ARCHER, MOIST                     MRN:          [id number]  DATE:09/25/2007                            DOB:          11-Jun-1932    PRIMARY CARDIOLOGIST:  Travis Buckles. Bensimhon, MD   PRIMARY CARE PHYSICIAN:  Travis Levins, MD   [name] returns today for followup of his congestive heart failure  which is secondary to diastolic dysfunction complicated by cardiorenal  syndrome.  He last saw Dr. Gala Kim in approximately 1 week ago, at  which time, he was found to be in mild volume overload.  Dr. Gala Kim  increased his Demadex to 20 mg b.i.d. and had him follow up with me  today.  He also increased his KCl to 40 mEq in the morning and 20 at  night.  [name] returns today for reevaluation.  He states I have  been seen a lot.  His medications are managed by his daughter, [name].  [name] is not with him today, one of his sons he has.  The  patient states he takes his medications just as his daughter gives him.  He denies orthopnea or PND.  Denies any shortness of breath with normal  activities.   PAST MEDICAL HISTORY:  Include:  1. Congestive heart failure secondary to diastolic dysfunction with      normal EF.  2. Nonobstructive CAD by cath.  3. Atrial fibrillation with amiodarone therapy.  4. Chronic renal insufficiency.  5. History of EtOH abuse.  6. Previous right brain CVA.   REVIEW OF SYSTEMS:  As stated above, otherwise negative.   CURRENT MEDICATIONS:  Include:  1. Lipitor 20.  2. Amiodarone 200.  3. Glimepiride 4 mg b.i.d.  4. Allopurinol 100 daily.  5. Aspirin 325.  6. Klor-Con 40 in the morning and 20 in the evening.  7. Actos 15 mg daily.  8. Levothyroxine 75 mcg daily.  9. Coreg 12.5 mg b.i.d.  10.Hydralazine 25 mg t.i.d.  11.Amlodipine 10 mg daily.  12.Avalide 150/12.5 mg daily.  13.Torsemide 20 mg b.i.d.   PHYSICAL EXAMINATION:  VITAL  SIGNS:  Weight today is 266 pounds, weight  is actually up 2 pounds from a week ago, blood pressure 161/76 with a  heart rate of 76.  GENERAL:  [name] is in no acute distress.  He ambulates with  assistance of a cane.  Speech is somewhat slurred, but this is baseline  for him.  NECK:  He has JVD around 8-10 cm.  LUNGS:  Clear to auscultation bilaterally.  CARDIOVASCULAR:  S1 and S2.  Regular rate and rhythm.  ABDOMEN:  Obese, soft and nontender.  Positive bowel sounds.  EXTREMITIES:  Lower extremities with 2+ pedal edema bilaterally, the  left being greater than the right.   IMPRESSION:  Congestive heart failure secondary to diastolic  dysfunction.  The patient with significant volume overload.  I am going  to increase his torsemide to 40 mg in the morning and 20 in the evening  and continue his KCl 40 mEq in the morning  and 20 in the evening, and  check a BMET and BNP today.  I will see [name] back in 1 week.  If  no improvement in his fluid volume status, I will be admitting him for  IV diuresis in the setting of cardiorenal syndrome.  He will need close  monitoring of his renal status.      [name], ACNP  Electronically Signed      Travis Rotunda, MD, [facility]  Electronically Signed   MB/MedQ  DD: 09/25/2007  DT: 09/26/2007  Job #: [id number]   cc:   Travis Levins, MD

## 2010-07-04 NOTE — Assessment & Plan Note (Signed)
[facility]                          CHRONIC HEART FAILURE NOTE   Travis, Kim                     MRN:          [id number]  DATE:06/30/2007                            DOB:          Mar 12, 1932    PRIMARY CARDIOLOGIST:  [name], M.D.   PRIMARY CARE PHYSICIAN:  [name], M.D.   PULMONOLOGIST:  [name], M.D., [facility]   [name] returns today for followup of his congestive heart failure,  status post recent hospital admission for acute-on-chronic diastolic CHF  exacerbation.  [name] was diuresed effectively, switched to  [facility], started on hydralazine, with good response to medications.  He  also experienced some hyperkalemia, and his p.o. potassium was  discontinued.  At time of discharge, his potassium was 3.5, BUN 64,  creatinine 2.4.  He was also asked to hold his furosemide, Avapro,  clonidine, ramipril, and BiDil.  He returns today with his daughter  [name] who misunderstood and has been giving her father the Avapro.  Mr.  Kim, however, states he is feeling much better than his pre-hospital  visit.  Shortness of breath much improved.  He states he does not feel  bloated and tight in his abdomen now after eating like he did  previously.  Ambulating with the assistance of his cane with minimal  shortness of breath.   PAST MEDICAL HISTORY:  1. Congestive heart failure secondary to diastolic dysfunction with EF      55-65%.  2. Nonobstructive CAD by cath.  3. Atrial fibrillation with amiodarone therapy.  4. Renal insufficiency.  5. History of EtOH abuse.  6. Previous right brain cerebrovascular accident.   REVIEW OF SYSTEMS:  As stated above, otherwise negative.   CURRENT MEDICATIONS:  1. Lipitor 20.  2. Amiodarone 200.  3. Amaryl 4 mg b.i.d.  4. Allopurinol 100 daily.  5. Aspirin 325 daily.  6. Avalide 300/12.5 mg daily.  7. Klor-Con 20 mEq daily.  8. Actos 15 daily.  9. Levothyroxine 75 mcg  daily.  10.Torsemide 20 mg b.i.d.  11.Hydralazine 25 mg b.i.d.  12.Coreg 12.5 mg b.i.d.   PHYSICAL EXAMINATION:  VITAL SIGNS:  Weight 257 pounds, blood pressure  127/52, heart rate 64.  GENERAL:  [name] is in no acute distress, ambulating with  assistance of his cane.  NECK:  No signs of jugular vein distention at 90-degree angle.  LUNGS:  He had some fine scattered rhonchi; otherwise, clear to  auscultation.  CARDIOVASCULAR:  S1-S2, positive S4.  ABDOMEN:  Obese, soft, nontender, positive bowel sounds.  LOWER EXTREMITIES:  Without clubbing or cyanosis.  He has +1 pitting  edema to right outer ankle.   IMPRESSION:  Congestive heart failure secondary to diastolic dysfunction  with much improvement in volume overload at this time.  We will check  blood work today, as the patient recently hospitalized and changes made  in medications.  The patient has continue to take medications which had  been discontinued during recent hospitalization.  We will need to see if  kidneys can tolerate this therapy.  Otherwise, continue diuretic at  current  dose and see the patient back in 3 weeks.      [name], ACNP  Electronically Signed      Travis Rotunda, MD, [facility]  Electronically Signed   MB/MedQ  DD: 06/30/2007  DT: 06/30/2007  Job #: [id number]   cc:   Travis Levins, MD

## 2010-07-04 NOTE — Assessment & Plan Note (Signed)
[facility]                          CHRONIC HEART FAILURE NOTE   [name], [name]                     MRN:          [id number]  DATE:06/20/2007                            DOB:          December 26, 1932    PRIMARY CARDIOLOGIST:  Dr. Nicholes Mango.   PRIMARY CARE PHYSICIAN:  Dr. Oliver Barre.   PULMONOLOGIST:  Dr. Marcelyn Bruins.   [name] returns today for follow-up of his congestive heart failure  status post recent hospitalization for acute on chronic diastolic  exacerbation.  [name] just went home on June 17, 2007.  He returns  today with [name], his daughter.  He is extremely symptomatic with his  symptoms.  His weight at time of discharge from [facility] was 254,  today he is 267 pounds.  He has extreme abdominal bloating, distention.  He complains of lower extremity edema, orthopnea, he is sleeping in the  recliner again extremely short of breath with minimal exertion.  He  denies any episodes of chest heaviness, tightness or pressure. He denies  any presyncope or syncopal episodes.   PAST MEDICAL HISTORY:  1. Congestive heart failure secondary to diastolic dysfunction with an      EF 55-65%.  2. Nonobstructive CAD.  3. History of previous right brain cerebrovascular accident.  4. Diabetes.  5. Hypertension.  6. Hyperlipidemia.  7. Morbid obesity.  8. Atrial fibrillation with amiodarone therapy.  9. Hypothyroidism.  10.History of chronic kidney disease.  11.Gout.  12.Peptic ulcer disease.  13.Anxiety.  14.History of prostate cancer.  15.Rule out a history of the ETOH abuse.   REVIEW OF SYSTEMS:  As stated above and otherwise negative.   CURRENT MEDICATIONS:  1. Lasix 40 mg b.i.d.  2. Metolazone 2.5 mg on Mondays and Fridays.  3. Lipitor 20 mg daily.  4. Amiodarone 200 daily.  5. Amaryl 4 mg b.i.d.  6. Allopurinol 100 mg daily.  7. Aspirin 325 daily.  8. Avalide 300/12.5 mg.  9. HCTZ daily.  10.K-Dur 20 mEq daily.  11.Coreg 25 b.i.d.  12.Norvasc 5 mg daily.  13.Synthroid 75 mcg daily.  Note, the patient's ramipril (ACE inhibitor)  is currently on hold  secondary to increased renal insufficiency.   PHYSICAL EXAM:  Weight 267 pounds, weight is up 13 pounds in 2 days.  Blood pressure 127/57 with a heart rate of 68.  [name] is extremely  dyspneic with minimal exertion. He has jugular vein distention 10-12 cm.  CARDIOVASCULAR:  Reveals S1 and S2, positive S4.  LUNGS:  He has fine crackles bilateral bases.  ABDOMEN:  Distended, obese, positive bowel sounds.  LOWER EXTREMITIES:  +3 pitting edema bilaterally.  NEUROLOGIC:  Alert and oriented x3, ambulating with assistance of cane.   IMPRESSION:  Acute on chronic congestive heart failure secondary to  diastolic dysfunction. Patient with class 3B symptoms at this time  status post recent hospitalization with increase in renal insufficiency.  The patient is being admitted for IV diuresis.  Also will order a renal  arterial Doppler study.  The patient is also a candidate for CardioMEMS  pulmonary artery device as  part of the [place] study.  I have discussed  the risks and benefits of the device with him. The patient is agreeable  to cardiac catheterization for placement of a CardioMEMS device. I will  admit him to the CCU today.  Continue medications with the exception of  his ACE inhibitor secondary to increased renal dysfunction. Dr. Nicholes Mango has been in to examine and assess, patient agrees with plan of  care. We will proceed with placement of CardioMEMS device once the  patient's CHF is stable.      Dorian Pod, ACNP  Electronically Signed      [name]. Bensimhon, MD  Electronically Signed   MB/MedQ  DD: 06/20/2007  DT: 06/20/2007  Job #: [id number]

## 2010-07-04 NOTE — Discharge Summary (Signed)
[name], [name]              ACCOUNT NO.:  [id number]   MEDICAL RECORD NO.:  [id number]          PATIENT TYPE:  INP   LOCATION:  3737                         FACILITY:  [facility]   PHYSICIAN:  Gordy Savers, MDDATE OF BIRTH:  1932/10/15   DATE OF ADMISSION:  06/03/2008  DATE OF DISCHARGE:  06/08/2008                               DISCHARGE SUMMARY   FINAL DIAGNOSIS:  Uncontrolled diabetes.   ADDITIONAL DIAGNOSES:  1. Diastolic heart failure.  2. Chronic kidney disease.  3. Hypokalemia.  4. Anemia.  5. Hypothyroidism.  6. Second-degree heart block.  7. Obesity.  8. Obstructive sleep apnea.   DISCHARGE MEDICATIONS:  1. Omeprazole 20 mg daily.  2. Clonidine 0.2 mg b.i.d.  3. Isosorbide 30 mg daily.  4. Amiodarone 100 mg one-half tablet daily.  5. Allopurinol 100 mg daily.  6. Glimepiride 4 mg b.i.d.  7. Zaroxolyn 2.5 mg twice weekly on Mondays and Thursdays.  8. Avalide 30/12.5 one daily.  9. Aspirin 325 daily.  10.Lipitor 40 mg daily.  11.Hydralazine 25 mg tablets 3 tablets t.i.d.  12.Norvasc 10 mg daily.  13.Januvia 25 mg daily.  14.Potassium chloride 20 mEq b.i.d.  15.Lantus insulin 25 units at bedtime.  16.Levothyroxine 0.112 mg daily.  17.Torsemide 20 mg b.i.d.   HISTORY OF PRESENT ILLNESS:  The patient is a 77 year old black  gentleman who has had multiple hospital admissions for decompensated  diastolic heart failure.  He has chronic kidney disease.  The patient  was seen in the ED on day of admission complaining of increasing  weakness and dizziness.  Blood sugars were noted to be consistently over  300.  The patient denied any chest pain or any change in his cardiac  symptoms.  He denied any shortness of breath or any worsening in his  chronic peripheral edema.  The patient was also admitted for further  evaluation and treatment of his uncontrolled diabetes.   LABORATORY DATA IN HOSPITAL COURSE:  The patient was admitted to the  hospital where he was  monitored with frequent capillary blood glucoses.  He was titrated on insulin therapy with a final dose of Lantus 25 mg at  bedtime.  He received sliding scale short-acting insulin during the  hospital.  He received diabetic instruction concerning insulin injection  technique.  His diastolic heart failure was monitored and he was  maintained on diuretic therapy.  Electrolytes were monitored carefully  and he required additional potassium supplementation.  His chronic  kidney disease remained stable.  The hospital course was marked by  steady improvement.  At the time of discharge, the patient was  ambulatory in the hall with a walker.  He received active physical  therapy and during the hospital regained his strength back to baseline.  He was monitored in a telemetry setting and remained in a Wenckebach AV  block.  At the time of discharge, his BNP was 757, BUN 27, creatinine  1.46 and fasting blood sugar 126.   Clinical exam revealed clear lung fields and trace peripheral edema.   DISPOSITION:  The patient was discharged today to return to the care  of  his primary care Makenize Messman within the next 2 or 3 days.  He will be  discharged on Lantus insulin in addition to his multiple preadmission  medications.  It should be noted that his hemoglobin A1c was 8.1 during  the hospital.   CONDITION ON DISCHARGE:  Stable.      Gordy Savers, MD  Electronically Signed     PFK/MEDQ  D:  06/08/2008  T:  06/08/2008  Job:  [id number]

## 2010-07-04 NOTE — Assessment & Plan Note (Signed)
[facility]                          CHRONIC HEART FAILURE NOTE   PIETRO, BONURA                     MRN:          [id number]  DATE:06/13/2007                            DOB:          1932/10/28    PRIMARY CARDIOLOGIST:  [name]. Bensimhon, MD   PRIMARY CARE PHYSICIAN:  Corwin Levins, MD.   PULMONARY:  Barbaraann Share, MD,FCCP   Mr. Dudenhoeffer is a very pleasant 77 year old African American gentleman  new to the heart failure clinic.  He was referred here by Dr. Nicholes Mango for further management of his congestive heart failure which  is diastolic in nature.  Mr. Dowson is accompanied by his daughter,  [name], today.  Mr. Yepiz lives here in [place] at the [employer] [facility] independent living.  He lives alone.  His daughter checks on  him frequently.  He states he does all his own cooking which is not very  much.  He relies on microwaved food frequently.  He has a Comptroller that  comes in twice a week and does some basic cleaning and laundry for him.  He is a very pleasant gentleman.  He states that he has been evaluated  by Dr. Shelle Iron recently and is pending sleep evaluation study.  Mr.  Riebel describes episodes of what sounds like orthopnea.  He has  actually been sleeping in the recliner at night because he states he  just does not feel safe sleeping in his bed.  However, he does lie down  sometimes during the day in his bed and sleeps without any problems.  Dr. Gala Romney has been treating him for volume overload and actually had  added some metolazone to his medical regimen twice a week.  He has been  very cautious with his medications because of increased renal  insufficiency.  Mr. Kuenzel returns today, however, complaining of  ongoing abdominal bloating and distention and lower extremity edema with  what sounds like orthopnea with increased shortness of breath during  minimal exertion.  He states compliance with his  diuretics and cardiac  medicines but this still does not feel like he is back to his baseline.   PAST MEDICAL HISTORY:  1. Includes congestive heart failure secondary to diastolic      dysfunction with most recent echocardiogram done November of 2008      showing an EF 55-65%.  2. Stress Myoview in November of 2008 showed an EF of 49%.  3. Nonobstructive coronary artery disease by cardiac catheterization.  4. History of previous right brain CVA.  5. Diabetes.  6. Hypertension.  7. Hyperlipidemia.  8. Morbid obesity.  9. Atrial fibrillation with amiodarone therapy.  10.Renal insufficiency with increased renal dysfunction with a      titration of diuretics.   REVIEW OF SYSTEMS:  As stated above, otherwise negative.   CURRENT MEDICATIONS:  1. Include Metolazone 2.5 mg on Mondays and Thursdays.  2. Extra KCl 20 mEq on days for metolazone.  3. Lipitor 20 mg daily.  4. Amiodarone 200 mg daily.  5. BiDil one tablet t.i.d.  6. Amaryl 4  mg b.i.d.  7. Allopurinol 100 mg daily.  8. Aspirin 325 mg daily.  9. Avalide 300/12.5 mg daily.  10.Klor-Con 20 mEq daily.  11.Clonidine 0.1 mg b.i.d.  12.Actos 50 mg daily.  13.Ramipril 10 mg b.i.d.  14.Carvedilol 25 mg b.i.d.  15.Norvasc 5 mg daily.  16.Furosemide 40 mg daily.  17.Levothyroxine 75 mcg daily.   ALLERGIES:  No known drug allergies.   PHYSICAL EXAMINATION:  VITAL SIGNS:  Weight 266 pounds.  The patient's  weight is actually down 5 pounds since the April 1 visit.  Blood  pressure 131/57 with a heart rate of 67.  GENERAL:  Mr. Kozak is in no acute distress.  He ambulates with  assistance of a cane.  NECK:  He has JVD around 10 cm at a 40 degree angle.  LUNGS:  Lungs are clear to auscultation with distant breath sounds  throughout.  CARDIOVASCULAR:  Exam reveals an S1 and S2 distant heart sounds.  He has  a positive S4 and a 2/6 systolic ejection murmur noted.  ABDOMEN:  Markedly obese, soft, nontender with some distention.   Unable  to evaluate for hepatomegaly.  LOWER EXTREMITIES:  Without clubbing or cyanosis.  He easily has 2-3+  pitting edema bilaterally.  NEUROLOGICAL:  He is alert and oriented x3, left-sided weakness noted,  ambulating with assistance of a cane.  Otherwise alert and moves all  extremities without difficulty.   IMPRESSION:  Congestive heart failure secondary diastolic dysfunction  with evidence of significant volume overload at this time.  I have  spoken with [name], his daughter and with the patient.  I do not feel  comfortable diuresing this patient outpatient with his increased renal  stress and I do not feel that the patient is responding significantly to  the intermittent doses of metolazone.  He is agreeable to being admitted  for IV diuresis and close monitoring of his renal function.  His  daughter is also in agreement with this.  We will continue his home  medications with the exception of his Actos and I am going to hold his  ACE inhibitor for now while I IV diurese him.  Will continue to monitor  closely.  The patient will continue to follow up outpatient  with Dr. Shelle Iron for his probable sleep apnea.  Dr. Rollene Rotunda has  been in to examine and assess the patient and agrees with plan of care.      Dorian Pod, ACNP  Electronically Signed      [name]. Bensimhon, MD  Electronically Signed   MB/MedQ  DD: 06/13/2007  DT: 06/13/2007  Job #: [id number]   cc:   Corwin Levins, MD

## 2010-07-04 NOTE — Consult Note (Signed)
NAMEHYMIE, [name]              ACCOUNT NO.:  [id number]   MEDICAL RECORD NO.:  [id number]          PATIENT TYPE:  INP   LOCATION:  4733                         FACILITY:  [facility]   PHYSICIAN:  [name]. Bensimhon, MDDATE OF BIRTH:  1933/01/02   DATE OF CONSULTATION:  05/16/2008  DATE OF DISCHARGE:                                 CONSULTATION   PRIMARY CARDIOLOGIST:  [name]. Bensimhon, MD   PRIMARY CARE:  Corwin Levins, MD   [name] is a 77 year old African American gentleman with known  history of diastolic heart failure with a normal EF by echo in spring of  2009.  He is followed in the Heart Failure Clinic.  Also [name] was  just recently discharged approximately 1 week ago status post  hospitalization for acute-on-chronic exacerbation of his heart failure.  During that hospitalization, he received IV Lasix and apparently was  discharged home on IV Lasix, although he normally is on Demadex for  management of his heart failure.  Since being discharged home, he states  the first couple of days he felt okay, however, over the last few weeks  has had increased symptoms suggestive of volume overload including  abdominal fullness, bloating, orthopnea, and lower extremity edema.  He  states he has not had much urine output with Lasix and he states  compliance with medications.  I know that his daughter, [name], helps  assist with his medications and diet at home.  We were asked to consult  for further management of his heart failure and also in regards to his  sinus brady.  The patient with a noted history of Wenckebach with beta  blocker therapy was recently started back on a low-dose beta blocker in  the office and has tolerated this dose fairly well.  However, during the  night, his heart rate was noticed to be maintained in the 50s with noted  Wenckebach at that time.   PAST MEDICAL HISTORY:  1. Congestive heart failure secondary to diastolic dysfunction with EF  now 60% previously was 35%.  2. Nonobstructive coronary artery disease by cardiac catheterization      in 2004.  3. History of second-degree heart block with intolerance to beta      blockers in the past.  4. COPD, home O2 use p.r.n. history of tobacco use.  5. Intolerance to ACE inhibitors and ARBs secondary to renal      insufficiency and extreme hypotension.  6. Previous CVA.  7. Paroxysmal atrial fib on low-dose amiodarone therapy not a Coumadin      candidate.  8. Diabetes.  9. Chronic kidney disease creatinine ranges from 1.4-2.9.  10.History of polysubstance abuse including tobacco and alcohol.  11.History of noncompliance.   SOCIAL HISTORY:  The patient lives in [place] alone.  He has a  daughter who is very attentive and home health aide.  He denies any  tobacco or EtOH use since 2000.  He tries to follow a 2-g ADA diet,  exercise mobility very limited, walks with a cane.   FAMILY HISTORY:  Mother is alive in her 28s.  Father deceased in his 21s  of unknown cause.   REVIEW OF SYSTEMS:  Positive for shortness of breath, dyspnea on  exertion, orthopnea, lower extremity edema, abdominal fullness, and  bloating.  All other systems reviewed are negative.   ALLERGIES:  No known drug allergies.   MEDICATIONS:  Currently include:  1. Aspirin 325.  2. Norvasc 10.  3. Lasix the patient is currently on 40 mg IV b.i.d.  4. Amaryl 4 mg.  5. Ferrous sulfate.  6. Zaroxolyn 5 mg.  7. Apresoline 50 t.i.d.  8. Isosorbide 30.  9. Coreg 3.125 b.i.d.  10.Levothyroxine 75 mcg daily.  11.KCl 40 mEq t.i.d.  12.Lipitor 20 mg.  13.Javuvia 100.   PHYSICAL EXAMINATION:  VITAL SIGNS:  Temperature 97.7, heart rate 71,  respirations 18, blood pressure 150/82.  The patient's weight is 113 kg  at this time, sat 93% on room air.  GENERAL:  No acute distress, sitting up in chair in room and family  present at current time.  HEENT:  Unremarkable.  NECK:  Supple without lymphadenopathy or  bruit.  JVD around 8-10.  CARDIOVASCULAR:  S1-S2, bradycardic.  LUNGS:  Clear to auscultation.  ABDOMEN:  Obese, soft, nontender, positive bowel sounds.  LOWER EXTREMITIES:  1 to 2+ pitting edema, left being greater than the  right.  NEUROLOGIC:  Alert and oriented x3.  SKIN:  Warm and dry.   Chest x-ray showing cardiomegaly, pulmonary vascular congestion.  EKG  sinus rhythm at 60 with Wenckebach, H&H 8.9 and 28.2.  WBC is 7.5,  platelets 338,000.  Sodium 141, potassium 3.4, creatinine 1.7.  BNP 309  yesterday and hemoglobin A1c of 8.1.   IMPRESSION:  1. Congestive heart failure secondary to diastolic dysfunction.  The      patient still with some mild volume overload, continue 24 hours of      IV Lasix, then switch back to Demadex 40 mg b.i.d., and decrease      Zaroxolyn 2.5 mg daily.  2. In regards to sinus brady with Wenckebach, we will stop his beta      blockers as his EF is within normal limits.  3. For blood pressure control we will bump his hydralazine to 75 mg      t.i.d. and continue the other medications.  Repeat his labs in the      morning, give him lactulose p.r.n. for constipation per the      patient's request for a laxative.   Dr. Gala Romney has been into examine and assess the patient and agrees  with the plan of care.      Dorian Pod, ACNP      [name]. Bensimhon, MD  Electronically Signed    MB/MEDQ  D:  05/16/2008  T:  05/17/2008  Job:  [id number]

## 2010-07-04 NOTE — Assessment & Plan Note (Signed)
Maintaining SR on amio. Not coumadin candidate.

## 2010-07-04 NOTE — Discharge Summary (Signed)
[name], LOBOS NO.:  [id number]   MEDICAL RECORD NO.:  [id number]          PATIENT TYPE:  INP   LOCATION:  2013                         FACILITY:  [facility]   PHYSICIAN:  Rollene Rotunda, MD, FACCDATE OF BIRTH:  28-May-1932   DATE OF ADMISSION:  06/20/2007  DATE OF DISCHARGE:  06/25/2007                               DISCHARGE SUMMARY   ADDENDUM:  The patient's medications at time of discharge showed include  Klor-Con 20 mEq one p.o. daily, if you will add this to his list of  discharging medications from Jun 25, 2007.      Dorian Pod, ACNP      Rollene Rotunda, MD, [facility]  Electronically Signed    MB/MEDQ  D:  07/22/2007  T:  07/22/2007  Job:  [id number]

## 2010-07-04 NOTE — Assessment & Plan Note (Signed)
[facility]                          CHRONIC HEART FAILURE NOTE   ARAN, MENNING                     MRN:          [id number]  DATE:07/28/2007                            DOB:          1933-01-06    PRIMARY CARDIOLOGIST:  [name]. Bensimhon, MD   PRIMARY CARE:  Corwin Levins, MD   PULMONOLOGIST:  Barbaraann Share, MD, [facility]   HISTORY OF PRESENT ILLNESS:  Mr. Rzepka returns today for followup of  his congestive heart failure.  He is accompanied by his daughter, [name].  He states he has been doing quite well since I last saw him.  Mr.  Martinezlopez is status post recent hospitalization for acute on chronic  exacerbation with some increased renal stress due to heavy diuretics.  At the time of discharge, I held his ARB and have been monitoring him  since that time.  Last BUN and creatinine checked on Jun 30, 2007, was  81 and 3.2; and then on Jul 10, 2007, BUN 51 and creatinine was down to  2.1, which is Mr. Leitz's baseline.  Mr. Filippini states he has been  doing quite well, continues to ambulate with assistance of a cane.  Denies any shortness of breath, orthopnea, or PND.  No palpitations,  presyncope, or syncopal episodes.   PAST MEDICAL HISTORY:  1. Congestive heart failure secondary to diastolic dysfunction with      normal EF.  2. Nonobstructive CAD by cath.  3. Atrial fib with amiodarone therapy.  4. Renal insufficiency.  5. History of EtOH abuse.  6. Previous right brain CVA.   REVIEW OF SYSTEMS:  As stated above, otherwise negative.   CURRENT MEDICATIONS:  1. Lipitor 20.  2. Amiodarone 200 daily.  3. Glimepiride 4 mg b.i.d.  4. Allopurinol 100 daily.  5. Aspirin 325 daily.  6. Klor-Con 20 daily.  7. Actos 15 daily.  8. Levothyroxine 75 mcg daily.  9. Coreg 12.5 mg b.i.d.  10.Hydralazine 25 mg t.i.d.  11.Amlodipine 10 mg daily.  12.Torsemide 20 mg b.i.d.   Note, Mr. Celmer had previously been on torsemide 20 mg daily with  an  additional 20 mg p.r.n., but his daughter states she has been giving him  the 20 mg b.i.d. consistently over the last week or so.   PHYSICAL EXAMINATION:  Weight 259 pounds, weight is up 2 pounds.  Blood  pressure 157/71, manual 150/80 with heart rate of 67, and sat 98% on  room air.  Mr. Gladu is in no acute distress.  NECK:  Without signs of jugular vein distention.  LUNGS:  Clear to auscultation bilaterally.  CARDIOVASCULAR:  S1 and an S2.  Regular rate and rhythm.  ABDOMEN:  Obese, soft, and nontender.  Positive bowel sounds.  LOWER EXTREMITIES:  Without clubbing or cyanosis.  He has 1+ nonpitting  edema.   IMPRESSION:  Congestive heart failure secondary to diastolic  dysfunction.  We will continue torsemide at current dose, 20 mg b.i.d.  Mr. Baines blood pressure is slowly creeping back up.  I am going to  try and resume his Avalide.  We  will start him back at 150/12.5 mg daily  and have blood work drawn on him next week and a blood pressure check.      Dorian Pod, ACNP  Electronically Signed      [name]. Bensimhon, MD  Electronically Signed   MB/MedQ  DD: 07/29/2007  DT: 07/30/2007  Job #: [id number]

## 2010-07-04 NOTE — Progress Notes (Signed)
Addended by: Meredith Staggers on: 07/04/2010 05:00 PM   Modules accepted: Orders

## 2010-07-04 NOTE — Assessment & Plan Note (Signed)
Chronic wenckebach. Stable.

## 2010-07-04 NOTE — Discharge Summary (Signed)
[name], [name]              ACCOUNT NO.:  [id number]   MEDICAL RECORD NO.:  [id number]          PATIENT TYPE:  INP   LOCATION:  4704                         FACILITY:  [facility]   PHYSICIAN:  Madolyn Frieze. Jens Som, MD, FACCDATE OF BIRTH:  [birth year]   DATE OF ADMISSION:  01/06/2008  DATE OF DISCHARGE:  01/11/2008                               DISCHARGE SUMMARY   PRIMARY CARDIOLOGIST:  [name]. Bensimhon, MD/[facility]   DISCHARGING DIAGNOSES:  1. Acute on chronic exacerbation of congestive heart failure which is      secondary to diastolic dysfunction with a normal ejection fraction      by echocardiogram in April 2009.  2. New onset of Mobitz type 1 heart block prompting discontinuation of      beta-blocker therapy during this hospitalization.  3. Intolerance to ACE inhibitors and ARBs secondary to extreme      hypotension and renal insufficiency.  4. Previous cerebrovascular accident.  5. Chronic renal insufficiency.  6. Medical noncompliance.  7. Diverticulosis.  8. Degenerative joint disease.  9. Dyslipidemia.  10.Benign prostatic hypertrophy.  11.Diabetes type 2.  12.History of paroxysmal atrial fibrillation requiring amiodarone      therapy.  13.History of ethanol abuse and tobacco abuse.   HOSPITAL COURSE:  [name] is a 77 year old African American  gentleman followed closely in the Heart Failure Clinic, but not always  compliant with his medications and diet restrictions.  He was seen in  the clinic on several occasions by myself with recommendations for  admission for more aggressive IV diuresis with patient refusing.  On day  of admission, the patient was seen in the office still with significant  volume overload.  Family encouraged the patient to go ahead and get  admitted in the setting of pending Thanksgiving holiday.  The patient  agreed and admitted for IV diuresis and close monitoring of his renal  function.  The patient was intolerant to ACE inhibitor and  ARBs  secondary to cardiorenal syndrome.  During hospitalization, we had  continued beta-blocker therapy, however, the patient experienced ongoing  Mobitz type 1 thus prompting discontinuation of his beta-blocker also.  The patient was started on calcium channel blocker, amlodipine 10 mg  daily.  His hydralazine increased to 50 mg t.i.d..  The patient  responded well to IV diuresis and was switched back over to his p.o.  Demadex.  On day of discharge, creatinine 1.58, previously 1.44 and  potassium 3.8, otherwise the patient was stable.  He states I am going  home today anyway.  Dr. Jens Som in to examine the patient.   DISCHARGE MEDICATIONS:  The patient is being discharged with the  following medications:  1. Hydralazine has been increased to 50 mg t.i.d., prescription      provided.  2. Amlodipine 10 mg daily, prescription provided.  3. Torsemide 20 in the morning and 40 in the evening (this works best      for the patient's schedule).  4. Aspirin 325.  5. Amiodarone 100 daily.  6. Glimepiride 4 mg b.i.d. with meals.  7. Lipitor 20 daily.  8. Potassium  20 mEq t.i.d.  9. Allopurinol 100 mg daily.   I will see him back in the CHF Clinic within 10 days.  Office will call  the patient's daughter, [name], at home to schedule appointment.  I have  reinforced written and verbal instructions to stop his carvedilol for  now.   DURATION OF DISCHARGE ENCOUNTER:  Over 30 minutes.      Dorian Pod, ACNP      Madolyn Frieze. Jens Som, MD, [facility]  Electronically Signed    MB/MEDQ  D:  01/11/2008  T:  01/11/2008  Job:  [id number]

## 2010-07-04 NOTE — Assessment & Plan Note (Signed)
[name]                          CHRONIC HEART FAILURE NOTE   [name], [name]                     MRN:          [id number]  DATE:12/25/2007                            DOB:          08/17/1932    PRIMARY CARE PHYSICIAN:  Travis Levins, MD   PRIMARY CARDIOLOGIST:  Travis Buckles. Bensimhon, MD   [name] returns today for further followup of his congestive heart  failure which is secondary to diastolic dysfunction complicated by  cardiorenal syndrome.  When I last saw [name] back in August, he  had significant volume overload.  I increased his torsemide and asked  him to return in 1 week for followup.  He did not return for followup in  fact did not return again until December 18, 2007, at which time he saw  Dr. Gala Kim.  At that visit, [name] once again to be found in  significant volume overload.  Dr. Gala Kim increased his Demadex to 40  in the morning and 20 in the afternoon.  He also discontinued his Actos  and arranged for him to followup with me here this week for a  reevaluation.  [name] still has significant amount of volume  overload.  Previous exams does not appear to be improved.  When I  mentioned the word Kim or possible hospitalization, [name]  became very angry and stated he refused to go to be admitted to the  Kim under no circumstances.  His daughter, [name] was a witness to  this.  [name] states he is fine .  He is not having any problems.   PAST MEDICAL HISTORY:  1. Congestive heart failure secondary to diastolic dysfunction with a      normal EF.  2. Nonobstructive CAD by cath.  3. Atrial fib with amiodarone therapy.  4. Cardiorenal syndrome with intolerance to ACE inhibitors or ARBs.      With significant bump in creatinine each time, an ACE or ARB has      been initiated.  5. Medical noncompliance.  6. History of ETOH abuse.  7. Previous right brain CVA.   REVIEW OF SYSTEMS:  As  stated above.   CURRENT MEDICATIONS:  1. Torsemide 20 mg b.i.d.  2. Klor-Con 20 mEq t.i.d.  3. Lipitor 20.  4. Amiodarone 100 mg daily.  5. Glimepiride 4 mg b.i.d.  6. Allopurinol 100 daily.  7. Aspirin 325.  8. Levothyroxine 75 mcg daily.  9. Coreg 12.5 mg b.i.d.  10.Hydralazine 25 mg t.i.d.  11.Amlodipine 10 mg daily.   PHYSICAL EXAMINATION:  VITAL SIGNS:  Weight 263 pounds, he is down 3  pounds from his visit on December 18, 2007, blood pressure 155/71 with a  heart rate of 71.  GENERAL:  [name] is in not acute distress.  NECK:  JVD around 10 cm at 45-degree angle.  LUNGS:  Relatively clear throughout.  CARDIOVASCULAR:  S1, S2.  Positive S4.  2/6 systolic ejection murmur  noted.  ABDOMEN:  Distended, obese, soft, positive bowel sounds.  EXTREMITIES:  Lower extremities with 2+ pitting edema bilaterally.  NEUROLOGICAL:  Alert and oriented x3.  Speech is slurred chronic  finding, ambulating with assistance of a cane.   IMPRESSION:  Congestive heart failure secondary to diastolic dysfunction  with significant volume overload.  The patient refusing to be admitted.  He has known cardiorenal syndrome.  Unable to tolerate ACE inhibitor or  ARB.  I am going to increase his Demadex.  We will continue 20 mg in the  morning and 40 in the afternoon as this works best for his schedule.  I  will need to see him back in 1 week.  If no improvement, the patient  will need to be admitted, also he will need blood work.      [name], ACNP  Electronically Signed      Travis Rotunda, MD, [facility]  Electronically Signed   MB/MedQ  DD: 12/25/2007  DT: 12/26/2007  Job #: [id number]

## 2010-07-04 NOTE — Patient Instructions (Signed)
Stop Potassium Your physician wants you to follow-up in: 4 months.  You will receive a reminder letter in the mail two months in advance. If you don't receive a letter, please call our office to schedule the follow-up appointment.

## 2010-07-04 NOTE — H&P (Signed)
NAMESEAMUS, WAREHIME              ACCOUNT NO.:  [id number]   MEDICAL RECORD NO.:  [id number]          PATIENT TYPE:  INP   LOCATION:  4733                         FACILITY:  [facility]   PHYSICIAN:  Rosalyn Gess. Norins, MD  DATE OF BIRTH:  1932/03/11   DATE OF ADMISSION:  05/15/2008  DATE OF DISCHARGE:                              HISTORY & PHYSICAL   CHIEF COMPLAINT:  Increasing shortness of breath and peripheral edema.   HISTORY OF PRESENT ILLNESS:  Mr. Petrelli is a 77 year old African  American gentleman, who was hospitalized on May 05, 2008, to May 08, 2008, for acute-on-chronic diastolic heart failure and was on the  Cardiology service.  During that admission, his BNP at admission was 300  and by the time of discharge it was 144.  He was treated with IV Lasix  with good diuresis with a 3 kg weight reduction, so that his discharge  weight was 112 kg.   Since discharge, the patient reports being 100% adherent to his  medication regimen and he has followed a salt-free diet.  Despite this  over the past 24-36 hours, he has had increased peripheral edema and  progressive shortness of breath.  He denies any chest pain, chest  discomfort, radiation of pain to his arm or his back, fever, sweats,  chills, cough, or signs of infectious disease.  Because of his  increasing shortness of breath, he presented to [facility]  Department where his BNP was up to 309.  Chest x-ray was read out  showing pulmonary edema.  He is now admitted for IV diuretics and  further treatment.   Please see the May 05, 2008, H and P and May 08, 2008, discharge  summary for past medical history, family history, and social history.   MEDICATION LIST:  As at discharge.   PHYSICAL EXAMINATION:  VITAL SIGNS:  At admission, temperature 97.4,  blood pressure 144/75, heart rate 70, respirations 18, and O2 sats 97%  on 2 liters.  GENERAL APPEARANCE:  An overweight African American male looking older  than stated chronologic age.  HEENT:  Significant for cerumen in both EACs.  The patient is  edentulous.  Conjunctivae clear, were muddy without icterus.  NECK:  Supple without thyromegaly.  NODES:  No adenopathy was noted.  CHEST:  The patient had no CVA tenderness.  He has no chest wall  deformity.  LUNGS:  The patient has good breath sounds.  I did not appreciate any  rales or wheezes.  He has no increased work of breathing at rest.  No  use of accessory muscles.  CARDIOVASCULAR:  2+ radial pulse.  No JVD.  He had a quiet precordium.  He has a regular rate to my exam.  I did not appreciate a murmur.  ABDOMEN:  Protuberant with positive bowel sounds in all four quadrants.  He had a firm abdomen, but nontender.  There is no guarding or rebound.  There is no organosplenomegaly.  GENITALIA:  Deferred.  EXTREMITIES:  No deformities are noted.  He does have 3+ pitting edema  to midcalf on both legs,  worse on the left than the right.  NEUROLOGIC:  The patient is awake, alert, oriented to person, place,  time, and context.  Cranial nerves II through XII grossly intact.  Exam  is nonfocal.   DATA BASE:  Chest x-ray read out as showing cardiac enlargement,  pulmonary vascular congestion.  I compared films from May 05, 2008, to  May 15, 2008, and actually see very little significant change with the  May 15, 2008.  Film is actually looking slightly better.  Hemoglobin  was 8.9, hematocrit 28.2, white count 7500 with a normal diff.  Sodium  was 136, potassium 4.3, chloride 105, CO2 23, BUN of 26, creatinine 1.6,  and glucose 262.  UA was negative.  BNP was 309.   ASSESSMENT AND PLAN:  1. Cardiovascular:  The patient with known paroxysmal atrial      fibrillation currently stable and is in regular rate and rhythm.      The patient was recently admitted with acute-on-chronic diastolic      heart failure.  Now, he is presenting with decompensation after 1      week of being at home.  His  BNP is risen from 144 to 309.  Chest x-      ray continues to show increased pulmonary vascular congestion.  He      has increased peripheral edema.  Plan, telemetry admission.  IV      Lasix 80 mg q.8.  We will add Zaroxolyn 5 mg p.o. q.a.m.  We will      continue all of his other home medications.  2. Anemia.  The patient had an anemia at his last hospitalization with      a hemoglobin of 9.3 g on May 06, 2008.  During his previous stay,      he did have a total iron that was low at 18, percent saturation was      low at 6%.  I do not see where he had a reticulocyte count or B12      level.  Plan, further workup to include anemia panel.  We will need      to Hemoccult all stools.  We will have to check the patient's      primary care home record and if he has not had colonoscopy in last      5 years, he would be due for this.  We will continue iron 325 mg      b.i.d.  3. Diabetes.  The patient has had persistently elevated serum glucose.      During his last hospital stay, I did not find A1c.  He is also on      sulfonylureas with Amaryl b.i.d.  Plan, we will obtain A1c.  The      patient is not a candidate for metformin because of renal      insufficiency.  We will change to Januvia 100 mg daily.  We will      follow sliding scale while in hospital.  He may be a candidate for      basal insulin therapy.  4. Hyperlipidemia.  The patient had a lipid panel on May 06, 2008,      which showed a cholesterol of 125, HDL 31, and LDL was 68.  Plan,      to continue his home Lipitor.  5. Hypertension.  The patient is adequately controlled on his present      medical regimen.  Rosalyn Gess Norins, MD  Electronically Signed     MEN/MEDQ  D:  05/15/2008  T:  05/16/2008  Job:  [id number]   cc:   [name]. Bensimhon, MD  Corwin Levins, MD

## 2010-07-04 NOTE — Progress Notes (Signed)
HPI:  Mr. Lenker is a very pleasant 77 year old male with  a history of diastolic heart failure as well as 2nd degree heart block (Wenckebach), hypertension,  hyperlipidemia, diabetes, previous CVA, paroxysmal atrial fibrillation on amiodarone. Not on coumadin as felt to be high risk of bleeding due to h/o ETOH. Echo 3/11 EF 60-65% mild MR.   Recently hospitalized for epistaxis and acute renal failure with Cr 3.1. Hydrated and renal function improved back to baseline of 1.7. Torsemide cut back on d/c. However he started swelling so family bumped torsemide back up to previous dose. Swelling resolved. Saw Dr. Jonny Ruiz yesterday Cr stable at 1.7 but K+ 5.7 (not hemolyzed). No changes made.   Now he feels fine. No significant edema. Weight back to baseline. CP or No palpitations or syncope. Taking medicines as prescribed.   ROS: All systems negative except as listed in HPI, PMH and Problem List.  Past Medical History  Diagnosis Date  . CHF (congestive heart failure)     secondary to diastolic dysfunction EF (previous EF 35-45%)ef 60%4/09  . Arrhythmia     atrial fibrillation /pt on amiodarone,thought to be poor coumadin  . Stroke   . Other and unspecified hyperlipidemia   . Hypertension   . Hypertrophy of prostate without urinary obstruction and other lower urinary tract symptoms (LUTS)   . Osteoarthrosis, unspecified whether generalized or localized, unspecified site   . Type II or unspecified type diabetes mellitus without mention of complication, not stated as uncontrolled   . AV block, Mobitz 1     Intolerance to ACE's / discontiuation of beta blockers  . Obstructive sleep apnea   . Iron deficiency anemia, unspecified     s/p EGD and coloscopy 3/10.gastritis.hemorrhids  . Cerebrovascular accident   . Prostate cancer   . Renal insufficiency   . Obesity   . Allergic rhinitis, cause unspecified 05/29/2010    Current Outpatient Prescriptions  Medication Sig Dispense Refill  . allopurinol  (ZYLOPRIM) 100 MG tablet Take 100 mg by mouth daily.        Marland Kitchen amiodarone (PACERONE) 200 MG tablet 200 mg. 1/2 tab qd       . aspirin 325 MG tablet Take 325 mg by mouth daily.        Marland Kitchen atorvastatin (LIPITOR) 40 MG tablet Take 40 mg by mouth daily.        . citalopram (CELEXA) 10 MG tablet Take 1 tablet (10 mg total) by mouth daily.  30 tablet  6  . fexofenadine (ALLEGRA) 180 MG tablet Take 1 tablet (180 mg total) by mouth daily.  30 tablet  2  . glucose blood test strip Use as instructed  100 each  5  . hydrALAZINE (APRESOLINE) 50 MG tablet Take 50 mg by mouth 3 (three) times daily.        . insulin detemir (LEVEMIR) 100 UNIT/ML injection Inject 100 Units into the skin daily.        . Insulin Pen Needle 31G X 8 MM MISC 1 each by Does not apply route daily.  100 each  1  . isosorbide mononitrate (IMDUR) 60 MG 24 hr tablet TAKE 1 TABLET BY MOUTH EVERY DAY  30 tablet  6  . Lancets (ONETOUCH ULTRASOFT) lancets 1 each by Other route daily.  100 each  1  . levothyroxine (LEVOTHROID) 125 MCG tablet Take 125 mcg by mouth daily.        Marland Kitchen omeprazole (PRILOSEC) 20 MG capsule Take 20 mg by mouth daily.        Marland Kitchen  potassium chloride SA (K-DUR,KLOR-CON) 20 MEQ tablet Take by mouth 3 (three) times daily.        Marland Kitchen spironolactone (ALDACTONE) 25 MG tablet Take 1 tablet (25 mg total) by mouth daily.  30 tablet  6  . torsemide (DEMADEX) 20 MG tablet Take 1 tablet (20 mg total) by mouth 2 (two) times daily.  60 tablet  6  . zolpidem (AMBIEN) 5 MG tablet Take 5 mg by mouth at bedtime as needed.        Marland Kitchen DISCONTD: insulin detemir (LEVEMIR) 100 UNIT/ML injection Inject 100 Units into the skin at bedtime.  10 mL  5     PHYSICAL EXAM: Filed Vitals:   07/04/10 1533  BP: 150/60  Pulse: 61  Resp: 18   General:  Elderly male. no resp difficulty. +dysarthria. walks with cane HEENT: normal except for facial droop Neck: supple. JVP 5  Carotids 2+ bilat; no bruits.  Cor: PMI nondisplaced. Mildly irregular. No rubs,  gallops. soft systolic murmur across precordium Lungs: clear Abdomen: obese .soft, nontender. No hepatomegaly. No bruits or masses. Good bowel sounds. Extremities: no cyanosis, clubbing, rash.No edema Neuro: alert & orientedx3, cranial nerves grossly intact except for facial droop. moves all 4 extremities w/o difficulty. affect pleasant   ECG: Sinus rhythm 61 with Wenckebach + LVH QRS 114 ms. Non-specific ST scooping.    ASSESSMENT & PLAN:

## 2010-07-04 NOTE — Discharge Summary (Signed)
[name], [name] NO.:  [id number]   MEDICAL RECORD NO.:  [id number]          PATIENT TYPE:  INP   LOCATION:  2013                         FACILITY:  [facility]   PHYSICIAN:  [name]. Bensimhon, MDDATE OF BIRTH:  1932-03-09   DATE OF ADMISSION:  06/20/2007  DATE OF DISCHARGE:  06/25/2007                               DISCHARGE SUMMARY   PRIMARY CARDIOLOGIST:  [name]. Bensimhon, MD   PRIMARY CARE PHYSICIAN:  Corwin Levins, MD   PULMONOLOGIST:  Barbaraann Share, MD, [facility]   DISCHARGE DIAGNOSES:  1. Acute-on-chronic diastolic heart failure exacerbation.  2. Cardiorenal syndrome with acute-on-chronic renal insufficiency.  3. Transient 2:1 AV block, type 2.  4. Hypokalemia.  5. Chronic anemia.  6. History of cardiovascular accident.  7. Paroxysmal atrial fibrillation, on Coumadin therapy.  8. History of hypertension.  9. History of hyperlipidemia.  10.Coronary artery disease.  11.Nonobstructive cardiac catheterization.  12.Stress Myoview in November 2008 showing an ejection fraction of      49%.  13.Diabetes.  14.Obesity.  15.History of alcohol abuse.  16.History of tobacco abuse.  17.History of ventricular tachycardia.  18.History of depression.  19.History of prostate cancer.   HOSPITAL COURSE:  [name] was seen in the Heart Failure Clinic on  Jun 20, 2007, found to be in acute volume overload who was admitted for  IV diuresis.  Initially, the patient was interested in participation in  the cardioMEMS trial, but opted against this.  He responded well to IV  diuresis, experienced a mild bump in creatinine.  Avapro was  discontinued.  The patient was started on hydralazine and tolerated  without problems, also the patient had hyperkalemia, p.o. potassium was  discontinued.  Stable blood pressure off clonidine and at the time of  discharge, the patient's weight 114.4 kg.  The patient being discharged  home to follow up in the Heart Failure Clinic with  me on Jun 30, 2007,  at 10:00 a.m.   DISCHARGE MEDICATIONS:  At the time of discharge, medications include:  1. Demadex 20 mg p.o. b.i.d.  2. Hydralazine 25 mg t.i.d.  3. Aspirin 325 mg daily.  4. Amaryl 4 mg b.i.d.  5. Allopurinol 100 mg daily.  6. Levothyroxine 75 mcg daily.  7. Amiodarone 200 mg daily.  8. Lipitor 20 mg at bedtime.  9. Amlodipine 10 mg daily.  10.Carvedilol has been decreased to 12.5 mg b.i.d.   The following medications had been discontinued:  1. Furosemide.  2. Avapro.  3. Clonidine.  4. Ramipril.  5. BiDil.   We will check BMET and BNP on the patient at the office.  I spoke with  [name], his daughter, by phone.  She knows to call me if she has any  questions.  If not, I will see the patient on the Jun 30, 2007.   DURATION OF DISCHARGE ENCOUNTER:  Greater than 30 minutes.      [name], ACNP      [name]. Bensimhon, MD  Electronically Signed    MB/MEDQ  D:  06/25/2007  T:  06/26/2007  Job:  [id number]  cc:   Corwin Levins, MD

## 2010-07-04 NOTE — Discharge Summary (Signed)
Travis, Kim              ACCOUNT NO.:  [id number]   MEDICAL RECORD NO.:  [id number]          PATIENT TYPE:  INP   LOCATION:  2007                         FACILITY:  [facility]   PHYSICIAN:  [name]. Bensimhon, MDDATE OF BIRTH:  October 04, 1932   DATE OF ADMISSION:  06/13/2007  DATE OF DISCHARGE:  06/17/2007                               DISCHARGE SUMMARY   PRIMARY CARDIOLOGIST:  Dr. Gala Romney.   PRIMARY CARE Glora Hulgan:  Dr. Oliver Barre   PULMONOLOGIST:  Dr. Marcelyn Bruins   DISCHARGE DIAGNOSIS:  Acute on chronic diastolic congestive heart  failure.   SECONDARY DIAGNOSES:  1. History of nonobstructive coronary artery disease.  2. History of normal left ventricular function with an ejection      fraction of 55% to 65% in November 2008.  3. History of previous right brain cerebrovascular accident.  4. Diabetes.  5. Hypertension.  6. Hyperlipidemia.  7. Morbid obesity.  8. Atrial fibrillation, on amiodarone therapy.  9. History of chronic kidney disease.   ALLERGIES:  No known drug allergies.   PROCEDURES:  None.   HISTORY OF PRESENT ILLNESS:  A 77 year old [place] American male with  the above problem list.  He was recently seen in clinic by Dorian Pod, nurse practitioner, June 13, 2007, when he complained of  orthopnea, progressive abdominal bloating, and distention, lower  extremity edema, and dyspnea on minimal exertion.  Decision made to  admit him for further evaluation and diuresis.   HOSPITAL COURSE:  The patient ruled out for MI.  He was placed on IV  diuretics and responded well with decrease in weight from 119.6 kilos on  admission down to 115.7 kilos on discharge.  He did have a rise in his  BUN and creatinine from 33 and 2.06 to 59 and 2.59 on June 16, 2007.  His BUN and creatinine are relatively stable this morning at 64 and once  again 2.59.  He is being discharged home today in good condition.   DISCHARGE LABS:  Hemoglobin 9.4, hematocrit 29.5, WBC  6.6, platelets  276, and MCV 84.8.  Sodium 137, potassium 3.6, chloride 100, CO2 26, BUN  64, creatinine 2.59, glucose 80, total bilirubin 0.4, alkaline  phosphatase 49, AST 13, ALT 15, albumin 3.4, CK 146, MB 2.02, troponin-I  0.05, total cholesterol 126, triglycerides 126, HDL 30, LDL 71, and  calcium 9.0.  Admission BNP was 113 and discharge BNP 32.0.  TSH 4.696.   DISPOSITION:  The patient is being discharged home today in good  condition.   FOLLOWUP APPOINTMENT:  We have arranged follow up with Dorian Pod,  nurse practitioner in [facility] on Jun 20, 2007, at 9:30 a.m..   DISCHARGE MEDICATIONS:  1. Lasix 40 mg b.i.d.  2. Metolazone 2.5 mg Mondays and Fridays.  3. Lipitor 20 mg daily.  4. Amiodarone 200 mg daily.  5. BiDil 1 tablet t.i.d.  6. Amaryl 4 mg b.i.d.  7. Allopurinol 100 mg daily.  8. Aspirin 325 mg.  9. Avalide 300/12.5 mg daily.  10.K-Dur 20 mEq daily.  11.Coreg 25 mg b.i.d.  12.Norvasc 5 mg daily.  13.Synthroid 75 mcg daily.   The patient was advised to discontinue Actos, clonidine, and ramipril.   OUTSTANDING LAB STUDIES:  None.   DURATION OF DISCHARGE/ENCOUNTER:  Forty minutes including physician  time.      Nicolasa Ducking, ANP      [name]. Bensimhon, MD  Electronically Signed    CB/MEDQ  D:  06/17/2007  T:  06/18/2007  Job:  [id number]   cc:   Corwin Levins, MD  Barbaraann Share, MD,FCCP

## 2010-07-04 NOTE — Assessment & Plan Note (Signed)
[facility]                            CARDIOLOGY OFFICE NOTE   [name], [name]                     MRN:          [id number]  DATE:05/21/2007                            DOB:          Jul 06, 1932    PRIMARY CARE PHYSICIAN:  Corwin Levins, MD.   INTERVAL HISTORY:  [name] is a very pleasant 77 year old male with  multiple medical problems including congestive heart failure with  nonobstructive coronary artery disease.  Previously EF was 30-45%.  However, most recent echocardiogram showed an EF of 55-65% with severe  concentric LVH and evidence of diastolic dysfunction.  He also has a  history of previous right brain CVA, diabetes, hypertension,  hyperlipidemia, morbid obesity, atrial fibrillation for which he is on  amiodarone.   He returns today with his daughter for routine followup.  In general, he  feels like he is doing better.  However, he does get short of breath  just with minimal activity such as walking 50-100 feet.  He denies any  chest pain.  He has had significant lower extremity edema, but no  orthopnea, no PND.  He feels tired a lot and falls asleep very quickly  even  while doing things like waiting for the bus.  He has been  compliant with all his medications.   CURRENT MEDICATIONS:  1. Lipitor 20 a day.  2. Amiodarone 200 a day.  3. BiDil 1 tablet t.i.d.  4. Glimepiride 4 mg b.i.d.  5. Allopurinol 100 a day.  6. Aspirin 325 a day.  7. Avalide 300/12.5 a day.  8. Potassium 20 a day.  9. Clonidine 0.1 b.i.d.  10.Actos 15 mg a day.  11.Ramipril 10 b.i.d.  12.Carvedilol 25 b.i.d.  13.Norvasc 5 a day.  14.Lasix 40 a day.  15.Synthroid 75 mcg a day.   PHYSICAL EXAMINATION:  GENERAL:  He is an elderly male.  He walks around  the office very slowly with some imbalance.  No respiratory difficulty.  VITAL SIGNS:  Blood pressure is 122/68, heart rate 54, weight is 271  which is up 13 pounds.  HEENT:  Normal.  NECK:   Supple and thick.  JVP is hard to evaluate.  Appears to be about  the 8 cm range.  Carotids are 2+ bilaterally without any bruits.  There  is no lymphadenopathy or thyromegaly.  CARDIAC:  PMI is laterally displaced.  He has distant heart sounds with  S4, 2/6 systolic ejection murmur at the right sternal border.  LUNGS:  Clear.  ABDOMEN:  Markedly obese, nontender, nondistended.  Unable to evaluate  for hepatosplenomegaly, no bruits, no masses.  EXTREMITIES:  Warm with no cyanosis or clubbing.  There is 3+ edema  bilaterally.  NEURO:  Alert and oriented x3.  He is dysarthric.  He has  mild left-sided weakness.  Cranial nerves II-XII are grossly intact.  Moves all four extremities without difficulty.  Affect is pleasant.   DIAGNOSTICS:  EKG shows sinus rhythm with sinus arrhythmia and first-  degree AV block, rate is 54.  No significant ST/T wave abnormalities.   ASSESSMENT/PLAN:  1.  Diastolic heart failure.  He has evidence of significant volume      overload.  Previously when we tried to increase his Lasix to 40      b.i.d., he developed acute on chronic renal insufficiency.  Thus I      suspect he has some cardiorenal syndrome.  We will try to use      intermittent doses of metolazone.  We will start with 2.5 mg a day      for 2 days and put him on a Monday/Thursday regimen.  We will check      a BMET in 2 weeks.  I will have him  follow up in the congestive      heart failure clinic for further medication titration.  He knows to      contact us if he is having problems.  2. Hypertension, well controlled.  3. Paroxysmal atrial fibrillation.  He is maintaining sinus rhythm on      amiodarone.  He clearly refuses Coumadin, although his CHAD's risk      is quite high.  Given the recent active data, he may benefit from      the addition of Plavix.  I will discuss this with him at the next      visit.  He had a recent thyroid and liver panel which were normal.      He does still need  PFTs for his amiodarone surveillance.  4. Hyperlipidemia, followed by Dr. Jonny Ruiz.  Goal LDL less than 70.  5. Probable obstructive sleep apnea.  We will refer him to the      pulmonary group for a sleep study.  I suspect this is also      contributing to his lower extremity edema.   DISPOSITION:  As above, we will refer him to the heart failure clinic to  be seen in the next couple of weeks.  I will see him back in 2-3 months.  He will get a BMET soon.     [name]. Bensimhon, MD  Electronically Signed    DRB/MedQ  DD: 05/21/2007  DT: 05/21/2007  Job #: [id number]   cc:   Corwin Levins, MD

## 2010-07-04 NOTE — Assessment & Plan Note (Signed)
Volume status looks good. Renal function stable. K+ is elevated. Stop kcl. Recheck BMET in 1 week.

## 2010-07-04 NOTE — Discharge Summary (Signed)
[name], [name]              ACCOUNT NO.:  [id number]   MEDICAL RECORD NO.:  [id number]          PATIENT TYPE:  INP   LOCATION:  4729                         FACILITY:  [facility]   PHYSICIAN:  Doylene Canning. Ladona Ridgel, MD    DATE OF BIRTH:  [birth year]   DATE OF ADMISSION:  05/05/2008  DATE OF DISCHARGE:  05/08/2008                               DISCHARGE SUMMARY   PROCEDURES PERFORMED DURING HOSPITALIZATION:  None.   FINAL DISCHARGE DIAGNOSES:  1. Acute on chronic diastolic congestive heart failure.  2. Mobitz II versus Wenckebach heart block.  3. History of dilated cardiomyopathy with a documented ejection      fraction of 35-45% with repeat echocardiogram on June 14, 2007,      with an ejection fraction of 60%.  4. Nonobstructive coronary artery disease with:      a.     Cardiac catheterization on February 02, 2003, performed by       Dr. Sharyn Lull showing no significant stenosis.      b.     On February 03, 2003, Myoview showing a fixed inferior       defect with no ischemia.  Ejection fraction 37%.      c.     Last Myoview reported to be in 2008 with an ejection       fraction of 49% with no ischemia.  5. Paroxysmal atrial fibrillation, on amiodarone, not a Coumadin      candidate.  6. Hypertension.  7. Hyperlipidemia.  8. Diabetes mellitus.  9. History of cerebrovascular accident.  10.History of chronic kidney disease with creatinine ranging from 1.4-      2.9.  11.History of tobacco and alcohol abuse.  12.History of noncompliance.  13.History of prostate cancer.   PLAN:  This is a 77 year old African American male with multiple medical  problems who began to experience progressive dyspnea on exertion,  orthopnea, and increasing abdominal girth with lower extremity edema.  The patient had progressed where he was now dyspneic at rest and he was  also complaining of not being able to sleep even in his recliner.  The  patient was brought to the emergency room and his BNP was found  to be at  342, but his chest x-ray was consistent with heart failure.  The patient  was given IV Lasix in the ED and began to diurese.  The patient was seen  and examined in the emergency room by Dr. Charlton Haws and Nicolasa Ducking, nurse practitioner.  The patient was placed on IV Lasix and  medications were managed concerning blood pressure and heart rate.  The  patient initially on admission was found to have a weight of 115.9 kg.  The patient diuresed to a final weight of 112.3 kg on IV Lasix.  The  patient did have an episode of a 3-second pause noted on telemetry for  which he was asymptomatic.  The patient's labs were found to be normal  without evidence of hypokalemia or hypomagnesemia.  The patient was seen  and examined by Dr. Arvilla Meres throughout hospitalization and felt  the  patient would be stable to return home.  On the day of discharge, he  was evaluated by Dr. Gilman Schmidt and also felt that he was stable for  discharge.  His blood pressure at that time was 150/90, pulse 61, and  respirations 20 with a weight of 112.3 kg.  There is no evidence of any  further heart block noted during the rest of his hospitalization.  The  patient is to follow up with Dr. Gala Romney in a couple of weeks and will  have a BMET drawn in approximately 5 days to evaluate renal function.   DISCHARGE LABORATORY DATA:  Sodium 139, potassium 3.9, chloride 101, CO2  of 26, BUN 26, creatinine 1.8, and glucose 220.  BNP 91.  Cholesterol  125, lipids 130, HDL 31, and LDL 68.  Hemoglobin 9.3, hematocrit 27.6,  white blood cells 7.8, and platelets 276.  Chest x-ray dated May 05, 2008, revealed findings compatible with congestive heart failure.  There  was no followup chest x-ray prior to discharge.  Discharge EKG dated  May 05, 2008, revealed sinus rhythm with nonspecific T-wave  abnormalities noted in the lateral leads, specifically V5 and V6 with QT  interval at 0.488.   DISCHARGE  MEDICATIONS:  1. Lasix 80 mg p.o. b.i.d. (new prescription).  2. Apresoline 50 mg 3 times a day.  3. Isosorbide 30 mg daily.  4. Coreg 3.125 mg twice a day.  5. Amiodarone 100 mg daily.  6. Allopurinol 100 mg daily.  7. Amaryl 4 mg twice a day.  8. Aspirin 325 mg daily.  9. Levothyroxine 75 mcg daily.  10.Lipitor 20 mg daily.  11.Norvasc 10 mg daily.  12.Potassium 40 mEq 3 times a day (new prescription provided).  13.Tylenol 650 mg p.r.n.   ALLERGIES:  No known drug allergies.   FOLLOWUP PLANS AND APPOINTMENT:  1. The patient will follow up with Dr. Arvilla Meres in 1-2 weeks.      Our office will call as this is a weekend.  2. The patient will follow up in 5 days to have a BMET drawn for renal      function.  3. The patient will follow up with Dr. Oliver Barre for continued      medical management of diabetes and other health issues.  4. The patient has been advised to weigh himself daily and if he gains      2-3 pounds in 24 hours.  He is to call our office for further      instructions.      Bettey Mare. Lyman Bishop, NP      Doylene Canning. Ladona Ridgel, MD  Electronically Signed    KML/MEDQ  D:  05/08/2008  T:  05/09/2008  Job:  [id number]   cc:   Corwin Levins, MD

## 2010-07-07 NOTE — Discharge Summary (Signed)
[name], [name]                       ACCOUNT NO.:  [id number]   MEDICAL RECORD NO.:  [id number]                   PATIENT TYPE:  INP   LOCATION:                                       FACILITY:  [facility]   PHYSICIAN:  Lorelle Formosa, M.D.           DATE OF BIRTH:  01-14-1933   DATE OF ADMISSION:  10/22/2001  DATE OF DISCHARGE:  10/28/2001                                 DISCHARGE SUMMARY   ADMISSION DIAGNOSES:  1. Congestive heart failure.  2. Hypertension.  3. Insulin-dependent diabetes mellitus.  4. Smoker addiction.   DISCHARGE DIAGNOSES:  1. Congestive heart failure, improved.  2. Hypertension.  3. Insulin-dependent diabetes mellitus.  4. Smoker addiction.   CONDITION ON DISCHARGE:  Stable.   DISPOSITION:  The patient will follow up with [place] in approximately one week.   HISTORY OF PRESENT ILLNESS:  This patient is a 77 year old black male who  presented with shortness of breath, one day's duration, which was worse with  any exertion.  He had nonproductive cough and denied chest pain.  No nausea  or vomiting, no diaphoresis.  The patient has a known history of congestive  heart failure and diabetes.  He was unsure of the medications he was taking,  but he had known problems.   MEDICATIONS:  His medications at home included:  1. __________ mg daily.  2. Lopressor 50 mg b.i.d.   ALLERGIES:  No known drug allergies.   The patient was admitted by Renaye Rakers, M.D., who was on call.   PAST MEDICAL HISTORY:  1. Hypertension.  2. Diabetes mellitus.  3. Congestive heart failure.   REVIEW OF SYSTEMS:  This was not remarkable except as listed in history of  present illness.   PHYSICAL EXAMINATION:  GENERAL APPEARANCE:  The patient was in no acute  distress.  VITAL SIGNS:  The patient received 100 mg of Lasix via EMS with minimal  improvement of his symptoms.  He was seen by the emergency room physician  with increased blood pressure and pedal edema.  Blood  pressure was 210/110.  Pulse 84, respiratory rate 18, temperature 97.1 and pulse oximeter on room  air 97%.  HEENT:  Moist mucous membranes.  NECK:  Supple without adenopathy.  CHEST:  Distant breath sounds.  CARDIOVASCULAR:  Regular rate and rhythm.  NEUROLOGICAL:  No significant abnormalities.  He did have some mild crackles  __________ but no wheezing.   LABORATORY DATA:  EKG revealed normal sinus rhythm with left ventricular  hypertrophy with repolarization abnormality.   Chest x-ray revealed cardiomegaly with mild interstitial edema.  Initial  chest x-ray on October 24, 2002, revealed interval development of frank  pulmonary edema.  October 22, 2001, chest x-ray revealed cardiomegaly.   CBC revealed white count of 9.3, hemoglobin 11.9, hematocrit 36.3 and RDW  15.4.  Glucose 136.  Chemistries were not significantly remarkable except  for slightly elevated glucose  to  peak of 158.  Liver functions were normal.  Glycosylated hemoglobin was 7.8.  Cardiac enzymes were not remarkable.  B  natriuretic peptide was 88 with normal being up to 100.   HOSPITAL COURSE:  The patient was admitted to telemetry floor and given  Cardene for control of his blood pressure with Altace 5 mg daily.  He was  continued on atenolol 25 mg p.o. daily also.  He was monitored and improved.  His diabetes was controlled as was his blood pressure and he was  considerably improved and was discharged for outpatient management.                                               Lorelle Formosa, M.D.    WWM/MEDQ  D:  09/02/2002  T:  09/03/2002  Job:  [id number]

## 2010-07-07 NOTE — Discharge Summary (Signed)
NAMERAEKWAN, SPELMAN                        ACCOUNT NO.:  [id number]   MEDICAL RECORD NO.:  [id number]                   PATIENT TYPE:  INP   LOCATION:  3708                                 FACILITY:  [facility]   PHYSICIAN:  Mohan N. Sharyn Lull, M.D.              DATE OF BIRTH:  May 06, 1932   DATE OF ADMISSION:  05/25/2003  DATE OF DISCHARGE:  05/28/2003                                 DISCHARGE SUMMARY   ADMITTING DIAGNOSES:  1. Decompensated congestive heart failure.  Rule out myocardial infarction.  2. Exacerbation of chronic obstructive pulmonary disease.  Rule out     pneumonia.  3. Uncontrolled hypertension.  4. Noninsulin-dependent diabetes mellitus.  5. Hypercholesterolemia.  6. Coronary artery disease.  7. Tobacco abuse.  8. History of alcohol abuse.  9. History of carcinoma of the prostate.   FINAL DIAGNOSES:  1. Compensated congestive heart failure.  2. Status post exacerbation of chronic obstructive pulmonary disease.  3. Resolving bronchitis.  4. Hypertension.  5. Noninsulin-dependent diabetes mellitus.  6. Hypercholesterolemia.  7. Coronary artery disease.  8. Tobacco abuse.  9. History of alcohol abuse.  10.      History of carcinoma of the prostate.   DISCHARGE HOME MEDICATIONS:  1. Coreg 6.25 mg one tablet q.12h.  2. Altace 10 mg one capsule twice daily.  3. Micardis/HCT 80/25 mg one tablet daily.  4. Amiodarone 200 mg one tablet daily.  5. Imdur 120 mg one tablet daily in the morning.  6. Lasix 80 mg one tablet daily.  7. K-Dur 20 mEq one tablet daily.  8. Lipitor 20 mg one tablet daily.  9. Amaryl 4 mg one tablet daily.  10.      Glucophage XR 500 mg one tablet daily with supper.  11.      Advair 250/50 one inhalation twice daily as before.  12.      Baby aspirin 81 mg one tablet daily.  13.      Nitrostat 0.4 mg sublingual, use as directed.  14.      Avelox 400 mg one tablet daily for 5 more days.   ACTIVITY:  As tolerated.   DIET:  Low-salt,  low-cholesterol, 1800 calorie ADA diet.   DISCHARGE INSTRUCTIONS:  1. The patient has been advised to monitor his blood sugar twice daily, and     monitor his weight daily.  2. The patient has been advised to stop smoking and stop drinking alcohol.   FOLLOW UP:  With me in 1 week.   CONDITION ON DISCHARGE:  Stable.   BRIEF HISTORY AND HOSPITAL COURSE:  Mr. Tousley is a 77 year old black male  with a past medical history significant for coronary artery disease,  hypertension, noninsulin-dependent diabetes mellitus, history of congestive  heart failure, history of paroxysmal atrial fibrillation, and ventricular  tachycardia in the past, hypercholesterolemia, history of tobacco abuse,  alcohol abuse, CA of the prostate.  He came to  the emergency room by his son  complaining of acute shortness of breath, and was noted to be in pulmonary  edema.  The patient also complains of cough with whitish phlegm.  Denies any  fever or chills.  Denies chest pain, nausea, vomiting.  Denies prolonged  immobilization.  He states he was working in the yard yesterday without  problems.  Denies any non-compliance to medication, but does not follow a  strict diet.  Denies any palpation, lightheadedness, or syncope.  Denies  PND, orthopnea, or leg swelling.   PAST MEDICAL HISTORY:  As above.   PAST SURGICAL HISTORY:  He had a prostatectomy in the past.   SOCIAL HISTORY:  He is single.  Retired as a Education administrator.  Smoked 1-2 packs per  day for 20 plus years, and now smokes occasionally.  Used to drink hard  liquor for 20-25 years, and now drinks beer occasionally.   FAMILY HISTORY:  Positive for coronary artery disease.   MEDICATION AT HOME:  1. Coreg 6.25 mg q.12h.  2. Altace 10 mg p.o. b.i.d.  3. Hyzaar 100/25 p.o. daily.  4. Amiodarone 200 mg one tablet daily.  5. Imdur 120 mg one tablet daily.  6. Baby aspirin 81 mg p.o. daily.  7. Lipitor 20 mg p.o. daily.  8. Lasix 40 mg p.o. daily.  9. Amaryl 2  mg p.o. daily.  10.      Glucophage XR 500 mg b.i.d.  11.      Nitrostat 0.4 mg sublingual.  12.      Advair 250/50 b.i.d.   PHYSICAL EXAMINATION:  GENERAL:  He was alert, awake, oriented x3, in mild  respiratory distress.  VITAL SIGNS:  Blood pressure was 257/148, pulse 150, in sinus tachycardia on  the monitor.  T-max was 99.2.  HEENT:  Conjunctivae were pink.  NECK:  Supple.  No JVD, no bruits.  LUNGS:  He has bilateral rales with occasional rhonchi.  CARDIOVASCULAR:  S1 and S2 were normal.  There was a soft systolic murmur,  and S3 and S4 gallop.  ABDOMEN:  Soft.  Bowel sounds were present.  Nontender.  EXTREMITIES:  There was no clubbing, cyanosis, or edema.   LABORATORY DATA:  Two sets of cardiac enzymes were negative.  CK was 148, MB  3.9; second CK 111, MB 4.3.  Troponin I were 0.04 and 0.07.  Sodium was 142,  potassium 4.2, chloride 108, bicarb 24, blood sugar 215, BUN 23, creatinine  1.4.  Liver enzymes were normal.  Hemoglobin was 12.8, hematocrit 39.6,  white count 12.4.  Cholesterol was 144, triglycerides 138.  HDL was low at  32.  LDL 84.  His hemoglobin A1C was 6.3.   BRIEF HOSPITAL COURSE:  The patient was admitted to the step-down unit.  MI  was ruled out by serial enzymes and EKG.  The patient was started on IV  heparin, nitrates, and p.o. Lasix was switched to IV with good diuresis.  The patient had not had any episodes of chest pain during the hospital stay.  The patient recently had cardiac catheterization approximately 3-4 months  ago, which showed moderate __________ lesions.  Subsequently, had a  Persantine Cardiolite, which was negative.  The patient has been ambulating  without any problems.  His lung sounds are pretty clear.  The patient will  be discharged home on the above medications, and will be followed up in my  office in one week.  The patient's blood pressure is still fluctuating and  staying in the high range.  I have changed his Avapro to  Micardis. Hopefully, his blood pressure will be better controlled.                                                Eduardo Osier. Sharyn Lull, M.D.    MNH/MEDQ  D:  05/28/2003  T:  05/30/2003  Job:  [id number]

## 2010-07-07 NOTE — Consult Note (Signed)
[place]. [facility]  Patient:    [name], [name] Visit Number: [id number] MRN: [id number]          Service Type: MED Location: [phone] Attending Physician:  Judie Petit Dictated by:   Francisca December, M.D. Proc. Date: 05/29/01 Admit Date:  05/27/2001   CC:         Lorelle Formosa, M.D.   Consultation Report  REASON FOR CONSULTATION:  Recurrent CHF.  IMPRESSIONS:  1. Congestive heart failure recently decompensated.  2. Moderate left ventricular dysfunction, ejection fraction 35-40%.     Echocardiogram May 28, 2001.  3. Chronic medication noncompliance.  4. H/O ETOH abuse, ?active.  5. No significant coronary artery disease by catheterization 1999 by Dr. Tyler Aas.  6. No current evidence of myocardial infarction by EKG or serial enzymes.  7. H/O cerebrovascular accident 2001, lacunar infarct.  8. Diabetes mellitus non-insulin-requiring.  9. Ongoing tobacco abuse. 10. Uncontrolled hypertension on admission. 11. Significant left ventricular hypertrophy on echocardiogram, recent. 12. Left ventricular dysfunction secondary likely to hypertension, possibly     coronary artery disease (doubt), also ETOH.  RECOMMENDATIONS: 1. Increase metoprolol to 75 mg p.o. b.i.d. 2. Reinitiate enalapril 20 mg p.o. b.i.d. 3. Reinitiate Norvasc 5-10 mg p.o. q.d. 4. Continue furosemide 40-80 mg p.o. q.d. 5. Will repeat Adenosine Cardiolite to rule out progressive CAD (doubt). 6. Actos is contraindicated in the setting of congestive heart failure.  FINDINGS:  [name] is a 77 year old man with prior history of CHF and poorly controlled hypertension possibly secondary to medication noncompliance who is admitted to [facility] on May 27, 2001 with rather abrupt complaints of significant dyspnea.  Denied any pressure or heaviness in his chest associated with this.  He was diaphoretic and blood pressure on arrival of EMS was 284  with a pulse of 100.  He was treated en route with nasal cannula oxygen and Lasix 50 mg given IV.  In the emergency room he had a 1300 cc diuresis and improved significantly.  Since being hospitalized he has had no significant dyspnea.  His blood pressures have run in the 180/80 range. He denies previous history of myocardial infarction, although apparently did undergo cardiac catheterization.  His family was under the impression he also had a balloon procedure.  This is not supported by cardiac catheterization report by Dr. Rinaldo Cloud dated November 05, 1997 wherein he had only mild coronary disease with an EF of 55%.  The coronary disease was not obstructive with a 30% stenosis in the LAD and a 30% stenosis in a tortuous proximal right coronary.  PAST MEDICAL HISTORY:  As noted above.  MEDICATIONS:  When discharged from hospital April 06, 1999: 1. Norvasc 10 mg p.o. q.d. 2. Enalapril 20 mg p.o. b.i.d. 3. Metoprolol 50 mg p.o. b.i.d. 4. Coated aspirin 325 mg p.o. q.d. 5. Lipitor 20 mg p.o. q.d. 6. Amaryl 4 mg p.o. q.d. 7. Lasix 40 mg p.o. q.d.  PAST SURGICAL HISTORY:  Prostate operation ?type.  ALLERGIES:  None.  FAMILY HISTORY:  Mother is alive in her 90s.  No specific illnesses.  Father died at 76 of complications of hypertension.  He has two siblings in good health and six children, one of whom has hypertension.  SOCIAL HISTORY:  [place] the [place].  He has a sixth grade education.  He is disabled and retired.  HABITS:  Smokes one and a half packs  of cigarettes per day.  Denies any ongoing alcohol abuse, but did drink up until two weeks ago.  No ongoing drug abuse.  REVIEW OF SYSTEMS:  Denies any problem with chronic vision problems or frequent headaches.  No difficulty with his hearing.  No problems with chronic cough or hemoptysis.  Denies any pressure or heaviness in the chest.  No palpitation or lightheadedness.  No history of syncope.   Denies hematemesis, hematochezia, abdominal pain, chronic constipation.  No dysuria, hematuria, nocturia.  No difficulty starting or stopping the stream.  No chronic gait disorder, although occasionally does use a cane.  No deficits secondary to his CVA.  No major muscle pain, weakness, or joint swelling or aching.  PHYSICAL EXAMINATION  VITAL SIGNS:  Blood pressure 174/90, pulse 66 and regular, temperature 97.4, respiratory rate 18, room air saturation 99%.  GENERAL:  This is a well-appearing 77 year old male, difficult speech pattern to understand.  No acute distress.  HEENT:  Head is atraumatic and normocephalic.  Pupils are equal, round, and reactive to light and accommodation.  Extraocular movements are intact.  Oral mucosa is pink and moist.  Teeth and gums are in poor repair.  The tongue is not coated.  NECK:  Supple without thyromegaly or masses.  There is no JVD at 45 degrees inclination.  Carotid upstrokes are normal.  There is no bruit.  CHEST:  Clear with reduced excursion and diminished breath sounds bilaterally. No wheezes, rales, or rhonchi.  HEART:  Regular rhythm.  Normal S1, S2.  S4 is present.  No S3, murmur, click, or rub.  PMI is not palpable.  ABDOMEN:  Protuberant, soft, and nontender without hepatosplenomegaly that is palpable.  No midline pulsatile mass.  Bowel sounds are present in all quadrants.  GENITOURINARY:  Genitalia:  Normal male phallus.  Descended testicles.  No lesions.  RECTAL:  Not performed.  EXTREMITIES:  Full range of motion.  No edema.  Intact distal pulses.  SKIN:  Warm, dry, and clear.  NEUROLOGIC:  Cranial nerves II-XII are intact.  Motor and sensory are grossly intact.  Gait not tested.  ACCESSORY CLINICAL DATA:  White blood cell count 8700, hemoglobin 13.5, platelets 264,000.  Serum electrolytes:  BUN, creatinine normal, glucose 243 on admission.  Serial CK normal in addition to MB.  Troponin at 0.09.   Electrocardiogram:   Sinus rhythm, left atrial enlargement, LVH, nonspecific T-wave abnormality.  Chest x-ray on admission:  Cardiomegaly and pulmonary interstitial edema.  Echocardiogram:  EF estimated 35-40%, diffuse hypokinesis, septum and posterior wall thickness 15 and 16 mm respectively, aortic valve slightly thickened, mild MR, mild mitral annular calcification.  COMMENTS:  I think the most likely diagnosis here is uncontrolled hypertension with resultant LV failure secondary to medication noncompliance and likely excessive ETOH and salt intake as well.  Because he is diabetic and 77 years of age and smokes, certainly the possibility of progressive coronary disease since 1999 is not out of the question.  No specific regional wall motion abnormality was seen on echocardiogram; however, a functional study to assess myocardial perfusion would be prudent.  Finally, I would discontinue Actos as it is relatively contraindicated in the setting of CHF due to the tendency for volume retention.  Thank you very much for allowing me to assist in the care of Mr. Damain Broadus.  It has been a pleasure to do so.  I will discuss his further care with you. Dictated by:   Francisca December, M.D. Attending Physician:  Ronne Binning,  Jearld Shines DD:  05/29/01 TD:  05/30/01 Job: [id number]

## 2010-07-07 NOTE — Discharge Summary (Signed)
[place]. [facility]  Patient:    [name], POTTINGER Visit Number: [id number] MRN: [id number]          Service Type: MED Location: [phone] Attending Physician:  Judie Petit Dictated by:   Lorelle Formosa, M.D. Admit Date:  05/27/2001 Discharge Date: 05/30/2001                             Discharge Summary  ADMISSION DIAGNOSES: 1. Congestive heart failure. 2. Uncontrolled hypertension. 3. Noninsulin-dependent diabetes mellitus. 4. Hyperlipidemia. 5. Smoking addiction.  DISCHARGE DIAGNOSES: 1. Congestive heart failure, compensated. 2. Hypertension. 3. Noninsulin-dependent diabetes mellitus. 4. Hyperlipidemia. 5. Smoking addiction.  DISCHARGE CONDITION:  Stable on discharge.  DISCHARGE FOLLOWUP:  Follow up with [place] in approximately one to two weeks.  DISCHARGE MEDICINES: 1. Vasotec 10 mg b.i.d. 2. Lopressor 50 mg b.i.d. 3. Norvasc 10 mg daily. 4. Ecotrin 325 mg daily. 5. Lipitor 200 mg daily. 6. Amaryl 4 mg daily. 7. Lasix 40 mg daily.  HISTORY OF PRESENT ILLNESS:  This patient is a 77 year old black male who presented with shortness of breath.  He came in by EMS and was noted upon arrival to have blood pressure of 284 over nonaudible diastolic.  His pulse was 100 and respirations were 30.  He was treated with O2 and Lasix 50 mg IM. He improved en route.  He was assessed by emergency room physician and referred for further patient care per Dr. Pecola Leisure, who was the on-call doctor. Patient used home medicines which included Actos, Lopressor and Lasix without relief.  His home diagnoses included congestive heart failure, hypertension, noninsulin diabetes mellitus and history of lipid disorder.  Patient was seen last admission on April 06, 1999 when he had gait ataxia and dizziness. 2-D echocardiogram revealed 40% ejection fraction, global hypokinesis and moderate to severe left ventricular hypertrophy and moderate  atrial enlargement.  Discharge medications included:  Norvasc 10 mg daily, enalapril 20 mg b.i.d., Lopressor 50 mg b.i.d., enteric-coated aspirin 325 mg q.d. and Lipitor 20 mg daily.  He also was on Amaryl 4 mg daily and Lasix 40 mg daily. Patient has had some difficulty with compliance.  ALLERGIES:  He has no known allergies.  PAST MEDICAL HISTORY, FAMILY HISTORY, SOCIAL HISTORY, PERSONAL HISTORY AND REVIEW OF SYSTEMS:  See admission History and Physical.  PHYSICAL EXAMINATION:  VITAL SIGNS:  Blood pressure 165/85, pulse 65, respirations 20, temperature afebrile.  Patient alert and oriented.  HEENT:  Multiple missing teeth.  Neck supple.  No JVD distention.  No bruit or thyromegaly.  CHEST:  Clear.  HEART:  Regular rate and rhythm.  ABDOMEN:  Soft, slightly protuberant.  EXTREMITIES:  Normal.  GENITOURINARY:  Descended bilateral testicles.  NEURO:  Grossly normal.  LABORATORIES:  EKG revealed normal sinus rhythm with left atrium enlargement, left ventricular hypertrophy, nonspecific ST-wave abnormality.  Chest x-ray revealed congestive heart failure pattern with interstitial pulmonary edema.  CBC revealed white count of 8.7 with hemoglobin of 13.5.  Platelets 264,000.  Chemistries remarkable for glucose 243.  CK 142 and 123 with CK-MB high of 4.2.  Troponin I:  0.04 and 0.09.  HOSPITAL COURSE:  Patient was seen in consultation by Dr. Amil Amen of [facility] and decided to do Cardiolite on an outpatient basis on Jul 02, 2001 in their clinic.  He has subsequently resolved very nicely after greater than 4 liters of diuresis.  He was felt stable.  They doubted  coronary artery disease was etiology.  Patient was thus discharged for outpatient management. Dictated by:   Lorelle Formosa, M.D. Attending Physician:  Judie Petit DD:  07/03/01 TD:  07/06/01 Job: [id number]

## 2010-07-07 NOTE — Discharge Summary (Signed)
[name], [name]              ACCOUNT NO.:  [id number]   MEDICAL RECORD NO.:  [id number]          PATIENT TYPE:  INP   LOCATION:  3730                         FACILITY:  [facility]   PHYSICIAN:  Osvaldo Shipper. Spruill, M.D.DATE OF BIRTH:  [birth year]   DATE OF ADMISSION:  12/01/2003  DATE OF DISCHARGE:  12/07/2003                                 DISCHARGE SUMMARY   DISCHARGE DIAGNOSES:  1.  Respiratory failure.  2.  Hypertensive cardiac disease with congestive heart failure.  3.  Chronic obstructive airway disease.  4.  Atrial fibrillation.  5.  Primary cardiomegaly.  6.  Congestive heart failure.  7.  Non-insulin-dependent diabetes mellitus.  8.  Hypercholesterolemia.   HISTORY OF PRESENT ILLNESS:  [name] is a 77 year old patient who  presented initially to the emergency department at the [facility]  on December 01, 2003 with the complaint of respiratory distress.   The patient presented to the emergency department in severe distress with  coughing and shortness of breath.  He was noted to be in respiratory  distress with prolonged expirations and decreased air movement.  Rales were  noted throughout the lung fields.  The patient was noted to have a  tachycardia and he was diaphoretic.  In the emergency department, he was  treated aggressively with Lasix therapy and IV nitroglycerin therapy and  BiPAP with improvement.  The patient was subsequently admitted for  aggressive treatment of this particular problem.   The patient was admitted to the medical service and placed on telemetry.  He  was placed on heparin per pharmacy protocol.  He was placed on an 1800  calorie ADA, low salt diet, placed on bedrest.  Strict I&O was carried out.  Serial electrocardiograms and cardiac enzymes were obtained.  The  electrocardiograms revealed a normal sinus rhythm with left ventricular  hypertrophy.  The cardiac enzymes revealed a slight elevation in CK-MB 4.1  on December 01, 2003 at  0355 hours.  However, at 1600 hours this was improved  to 3.7.  The patient was treated with IV Lasix 80 mg every 12 hours IV.  The  patient was also treated with Altace, Cozaar, amiodarone 200 mg daily,  Coreg, and a baby aspirin.   The patient was seen by the diabetes team.  He was placed on Amaryl 4 mg, as  well as NovoLog insulin t.i.d. a.c. and h.s.  The patient had a hemoglobin  A1C which was elevated.   On December 04, 2003, the congestive failure and pulmonary edema continued to  show improvement.  The IV nitroglycerin was gradually tapered.  Attempts  were made to improve the patient's mobility, however, the patient refused at  this time.  On December 07, 2003, it was the opinion that the patient had  received maximal benefit from this hospitalization.  It was the opinion that  the patient was back to his baseline.  It is of note that the patient has an  ejection fraction from his previous studies of 37% and the patient will be  following very closely in the office.  He has been advised  on salt  restrictions, fluid restrictions, and the importance of taking his  medications as ordered.   MEDICATIONS AT DISCHARGE:  1.  Potassium 20 mEq daily.  2. Cozaar 100 mg daily.  3. Amiodarone 200 mg      daily.  4. Lipitor 20 mg daily.  5. Lasix 40 mg daily.  6. Amaryl 2 mg      daily.  7. Allopurinol 100 mg daily.  8. Altace 10 mg twice a day.  9.      __________ one three times a day.  10. Coreg 6.25 mg twice a day.  11.      Clonidine 0.1 mg twice a day.      HB/MEDQ  D:  04/11/2004  T:  04/11/2004  Job:  [id number]   cc:   Eduardo Osier. Harwani, M.D.  [address]     [address]  [place]  [place] [address]  Fax: [phone]

## 2010-07-07 NOTE — Discharge Summary (Signed)
[place]. [facility]  Patient:    [name], [name]                     MRN: [id number] Adm. Date:  [id number] Disc. Date: 04/12/99 Attending:  Judie Petit                           Discharge Summary  ADMISSION DIAGNOSES: 1. Inability to walk, move legs. 2. Diabetes. 3. Hypertension. 4. History of alcohol use. 5. Hypercholesterolemia.  DISCHARGE DIAGNOSES: 1. Cerebrovascular accident, new onset. 2. Hypertension. 3. Non-insulin-dependent diabetes mellitus. 4. Hypercholesterolemia. 5. Chronic alcoholism.  CONDITION ON DISCHARGE:  Stable.  FOLLOW-UP:  Follow up in the office in approximately one week.  CONSULTATIONS:  Marlan Palau, M.D., neurologist.  DISCHARGE MEDICATIONS: 1. Lopressor 50 mg b.i.d. 2. Enalapril 20 mg b.i.d. 3. Norvasc 10 mg daily. 4. Lasix 40 mg daily. 5. Amaryl 4 mg daily. 6. Enteric-coated aspirin 325 mg daily. 7. Lipitor 10 mg daily.  HISTORY:  This patient is a 77 year old black male who presented to the hospital after not being able to walk or move his legs.  He was admitted by Dr. Renaye Rakers, who was on call.  The patient stated this was sudden onset.  He has history of diabetes and hypertension and alcoholism.  He apparently had stopped drinking for a while but began drinking with his brother on a binge recently and he began having the difficulties with his walking with some slurred speech, double vision, and instability.  The patients past medical history, family history, personal history, social history, and review of systems are outlined in the admission history and physical.  CURRENT MEDICATIONS: 1. Lipitor 10 mg daily. 2. Amaryl 4 mg daily. 3. Lopressor 50 mg b.i.d. 4. Enalapril 20 mg b.i.d. 5. Lasix 40 mg daily. 6. Norvasc 10 mg daily.  PHYSICAL EXAMINATION:  VITAL SIGNS:  On admission, blood pressure 176/86, pulse 58, temperature 97.6, respirations 18.  GENERAL:  The patient  appeared in no significant distress.  HEENT:  PERRLA; EOMs intact.  NECK:  Supple.  CHEST:  Clear to auscultation.  HEART:  Regular rhythm and rate without murmur.  ABDOMEN:  Soft with some distention.  Bowel sounds were normal.  GENITALIA:  Bilateral descended testes.  EXTREMITIES:  No edema.  NEUROLOGIC:  The patient was alert and oriented with difficulty ambulating.  LABORATORY DATA:  Carotid Dopplers revealed mild plaque in the proximal internal carotid artery bifurcation, left mild angular plaque in the proximal internal carotid artery with short segments of diffuse disease in the vessel, although no significant internal carotid artery stenosis.  A 2-D echocardiogram revealed global hypokinesis, moderate to severe LVH, with an ejection fraction of 40% approximately with left atrial moderately enlarged.  Vitamin B12 was 470, red blood folate was 347.  RPR nonreactive.  TSH 4.59. CBC: White count 7.8, hemoglobin 14.2, hematocrit 41.6.  Sed rate was 24.  PT was normal.  Chemistries:  Glucose 208 with other parameters normal.  Cardiac enzymes were negative x 3.  Urinalysis had 1 mg% protein with urobilinogen 1.0 and glucose 1 mg%.  ______ had 1.022.  Alcohol level less than 10.  Cervical spine x-ray revealed a lateral prominent transverse process at C7, with degenerative hypertrophic arthritis changes with possible compromise of the cervical foramen of C4-5 on the right and C5-6 on the left.  Chest x-ray revealed cardiomegaly and pulmonary venous hypertension with tortuosity of the  thoracic aorta.  CT of head without contrast revealed chronic findings including chronic  ischemic microvascular white matter disease, probable old left anterior limb internal carotid lacuna, physiological basal ganglia calcification, and atherosclerotic calcification of the intracranial carotid arteries with no acute findings.  EKGs revealed normal sinus rhythm with left ventricular  hypertrophy.  Initial EKG revealed sinus bradycardia with a rate of 58.  MRI revealed atrophy and small vessel disease.  There is small deep white matter acute lacunar infarct seen in diffuse weighted imaging.  There is no abnormal enhancement seen in postcontrast and incidental ethmoid sinus disease is appreciated.  HOSPITAL COURSE:  The patient was admitted to the hospital and monitored on the  telemetry unit.  His blood pressure was controlled and his diabetes was treated  with sliding scale insulin.  The patient initially was unable to walk without falling to his right side.  This improved and he was seen in consultation by Dr. Anne Hahn, neurologist.  The patient was given Norvasc 10 mg, K-Dur for hypokalemia, Accupril 4 mg daily, HCTZ daily, and Lopressor increased to 50 mg b.i.d.  The patient at this time is able to ambulate without significant difficulty and is ble to walk by holding on to the wall if he loses balance and can walk in the corridors.  It is felt that he can do well with a cane only.  He is given instructions regarding importance of controlling his hypertension, diabetes, and attending AA meetings, or at least not using alcohol. DD:  04/12/99 TD:  04/12/99 Job: [id number]

## 2010-07-07 NOTE — Cardiovascular Report (Signed)
NAMEGRAEME, MENEES                        ACCOUNT NO.:  [id number]   MEDICAL RECORD NO.:  [id number]                   PATIENT TYPE:  INP   LOCATION:  3731                                 FACILITY:  [facility]   PHYSICIAN:  Mohan N. Sharyn Kim, M.D.              DATE OF BIRTH:  01/11/1933   DATE OF PROCEDURE:  02/02/2003  DATE OF DISCHARGE:  02/03/2003                              CARDIAC CATHETERIZATION   PROCEDURE:  1. Left cardiac catheterization.  2. Selective left and right coronary angiography.  3. Left ventriculography.  4. Aortography via right groin using Judkins technique.   INDICATIONS FOR PROCEDURE:  [name] is a 76 year old black male with  past medical history significant for coronary artery disease, history of  dilated cardiomyopathy, history of congestive heart failure, hypertension,  non-insulin-dependent diabetes mellitus, history of alcohol abuse, tobacco  abuse, history of paroxysmal atrial fibrillation, history of nonsustained  VT, hypercholesterolemia, history of CA prostate, COPD.  He came to the ER  complaining of retrosternal and right-sided chest tightness radiating to  both arms associated with shortness of breath.  The patient denies any  nausea, vomiting, diaphoresis.  Denies palpitations, lightheadedness, or  syncope.  Denies PND, orthopnea, leg swelling.  The patient received  sublingual nitroglycerin in the ER with relief of chest pain.  EKG done in  the ER showed normal sinus rhythm with LVH with strain pattern versus  lateral ischemia.  Due to multiple risk factors and typical anginal chest  pain responding to nitroglycerin and history of coronary artery disease in  the past, discussed with patient and his daughter regarding left  catheterization, possible PTCA/stenting, its risks, i.e., death, MI, stroke,  need for emergency CABG, risk of restenosis, local vascular complications,  and consented for the procedure.   PROCEDURE:  After obtaining  the informed consent patient was brought to the  catheterization laboratory and was placed on fluoroscopy table.  Right groin  was prepped and draped in usual fashion.  2% Xylocaine was used for local  anesthesia in right groin.  With the help of thin wall needle a 6-French  arterial sheath was placed.  The sheath was aspirated and flushed.  Next, 6-  French left Judkins catheter was advanced over the wire under fluoroscopic  guidance up to the ascending aorta where it was pulled out.  The catheter  was aspirated and connected to the manifold.  Catheter was further advanced  and engaged into left coronary ostium.  Multiple views of the left system  were taken.  Next, the catheter was disengaged and was pulled out over the  wire and was replaced with 6-French right Judkins catheter which was  advanced over the wire under fluoroscopic guidance up to the ascending aorta  where it was pulled out.  The catheter was aspirated and connected to the  manifold.  Catheter was further advanced and engaged into right coronary  ostium.  Multiple views  of the right system were taken.  Next, the catheter  was disengaged and was pulled out over the wire and was replaced with 6-  French pigtail catheter which was advanced over the wire under fluoroscopic  guidance up to the ascending aorta.  Catheter was further advanced across  the aortic valve into the LV.  LV pressures were recorded.  Next, LV graphy  was done in 30 degree RAO position.  Post angiographic pressures were  recorded from LV and then pullback pressures were recorded from the aorta.  There was no gradient across the aortic valve.  Next, the pigtail catheter  was pulled down into the descending abdominal aorta.  Aortography was done  in PA position.  Next, the pigtail catheter was pulled out over the wire.  Sheaths were aspirated and flushed.   FINDINGS:  LV showed good LV systolic function, LVH, EF of approximately  50%.  Left main was  patent.  LAD had 50-60% mid focal stenosis.  In one view  it appears to be 60-70%.  Diagonal 1 is small which is patent.  Left  circumflex has 20-30% mid stenosis.  OM1 is very small.  OM2-OM3 are patent.  RCA has 50-60% proximal and 40% mid stenosis.  Aortography showed no  evidence of aortic aneurysm.  Bilateral renal arteries are patent.  The  patient tolerated procedure well.  The patient was transferred to recovery  room.  The patient will be scheduled for Persantine Cardiolite tomorrow to  evaluate the significance of mid LAD lesion.  If Cardiolite shows evidence  of reversible ischemia in the anterior wall, will schedule him for PCI to  LAD.                                               [name], M.D.    MNH/MEDQ  D:  02/03/2003  T:  02/04/2003  Job:  [id number]   cc:   Cath Lab

## 2010-07-07 NOTE — H&P (Signed)
[facility]  Patient:    [name], [name]                     MRN: [id number] Adm. Date:  [id number] Attending:  Judie Petit Dictator:   Pincus Badder, P.A. CC:         Lorelle Formosa, M.D.                         History and Physical  CHIEF COMPLAINT:  Dizziness, unable to walk, feels like he will fall only when e is standing up.  HISTORY OF PRESENT ILLNESS:  The patient is a 77 year old male with history of hypertension, who reports dizziness for three days.  He states he cannot walk. The dizziness is only present when he is standing.  PAST MEDICAL HISTORY:  Hypertension, poor control; alcohol abuse; depression; diabetes mellitus, non-insulin-dependent; hypercholesterolemia; arterial sclerosis; prostate cancer; pulmonary edema.  ALLERGIES:  No known drug allergies.  CURRENT MEDICATIONS: 1. Lasix. 2. Lopressor. 3. ______ 4. Norvasc. 5. Lipitor.  REVIEW OF SYSTEMS:  His weight is stable.  ENT:  Negative.  GI:  Negative. Pulmonary:  Negative.  GU:  Negative.  SOCIAL HISTORY:  He is divorced with six children.  Heavy alcohol abuse and tobacco abuse.  PHYSICAL EXAMINATION:  VITAL SIGNS:  Blood pressure 176/86, pulse 58, temperature 97.9, respirations 18.  HEENT:  Head is normocephalic, atraumatic.  Eyes:  EOMI; PERRL.  Disks flat. Some positive nystagmus.  Ears:  Cerumen impacted.  Nose:  Clear.  Throat:  Clear. Mouth: Poor dentition.  NECK:  Supple.  No thyromegaly.  No carotid bruits.  CHEST:  Clear to auscultation bilaterally.  COR:  Bradycardic.  No S3, no S4.  ABDOMEN:  Obese, protruding.  Positive bowel sounds.  No masses, no organomegaly.  EXTREMITIES:  No edema.  No joint deformities.  NEUROLOGIC:  He is alert and oriented x 3.  Gait:  The patient is unable to stand.  RECTAL:  Deferred.  LABORATORY DATA:  EKG shows sinus bradycardia and left ventricular hypertrophy.  CAT scan of the head is  negative.  White blood cells 7.8, hemoglobin 14.2, hematocrit 41.6.  PT 13.5.  Sodium 140,  potassium 3.5, chloride 97, CO2 29, glucose 208, BUN 9, creatinine 0.8, total protein 7.7, SGOT 17, SGPT 12, calcium 9.6.  Alcohol less than 10.  ASSESSMENT:  Dizziness, bradycardia, questionable nystagmus, hypertension, diabetes mellitus, hypercholesterolemia.  PLAN:  The plan is to admit to telemetry bed and to start appropriate medications.  DD:  04/07/99 TD:  04/07/99 Job: [id number]

## 2010-07-07 NOTE — Discharge Summary (Signed)
[name], [name]                        ACCOUNT NO.:  [id number]   MEDICAL RECORD NO.:  [id number]                   PATIENT TYPE:  INP   LOCATION:  3738                                 FACILITY:  [facility]   PHYSICIAN:  Mohan N. Sharyn Lull, M.D.              DATE OF BIRTH:  [birth year]   DATE OF ADMISSION:  05/24/2002  DATE OF DISCHARGE:  05/28/2002                                 DISCHARGE SUMMARY   ADMISSION DIAGNOSES:  1. Acute decompensated congestive heart failure.  2. New onset atrial fibrillation.  3. Hypertension.  4. Diabetes mellitus.  5. Chronic obstructive pulmonary disease.  6. History of prostate cancer.  7. Obesity.  8. History of alcohol abuse.  9. Hypercholesterolemia.   DISCHARGE DIAGNOSES:  1. Compensated congestive heart failure.  2. Negative Persantine Cardiolite with inferior wall scar, no evidence of     ischemia, ejection fraction 30%.  3. Dilated cardiomyopathy, multifactorial; i.e., ischemia, hypertension,     alcohol abuse, and probable tachycardia induced.  4. Status post paroxysmal atrial fibrillation with rapid ventricular     response.  5. Status post nonsustained ventricular tachycardia.  6. Status post asthmatic bronchitis.  7. Non-insulin-dependent diabetes mellitus.  8. Hypertension.  9. History of recurrent recent alcohol abuse.  10.      History of alcohol abuse.  11.      Tobacco abuse.  12.      Hyperkalemia, iatrogenic.   DISCHARGE MEDICATIONS:  1. Altace 10 mg 1 capsule twice daily.  2. Hyzaar 100/25 one tablet daily.  3. Lasix 80 mg 1 tablet daily.  4. Imdur 60 mg 1 tablet daily in the morning.  5. Amiodarone 200 mg 1 tablet daily.  6. Glucophage XR 500 mg 1 tablet twice daily with meals.  7. Advair inhalers 250/50 one inhalation every 12 hours.  8. Baby aspirin 81 mg 1 tablet daily.  9. Lipitor 10 mg 1 tablet daily.   ACTIVITY:  As tolerated.   DIET:  Low-salt, low-cholesterol, 1800 calorie ADA diet.  The patient has  been advised to restrict fluid to 1 liter per day.   SPECIAL INSTRUCTIONS:  The patient has been advised to call if there is  increasing shortness of breath, leg swelling, or weight gait above 2 pounds  from baseline.   The patient has been advised to monitor weight and blood sugar daily, BNP in  one week.  Follow up with me in one week.   CONDITION ON DISCHARGE:  Stable.   BRIEF HISTORY:  The patient is a 77 year old black male with past medical  history significant for multiple medical problems; i.e., hypertension,  diabetes mellitus, history of CVA, COPD, hypercholesterolemia, history of  alcohol abuse and tobacco abuse.  He was admitted by Dr. Shana Chute with  diagnosis of progressively increasing shortness of breath.  The patient  denies any chest pain or palpitations.  No syncope.  The patient had cardiac  catheterization in the past which showed mild coronary artery disease.  The  patient also has been coughing with sore throat, poor appetite, and was  recently seen in my office and treated with Zithromax.   PAST MEDICAL HISTORY:  As above.   PHYSICAL EXAMINATION:  VITAL SIGNS:  Blood pressure 151/72, pulse 137  irregularly irregular, respirations 28.  NECK:  Supple.  Positive JVD.  There was no bruit.  LUNGS:  Bibasilar rales and rhonchi with scattered wheezing.  CARDIOVASCULAR:  S1 and S2 okay.  Heart rhythm was irregularly irregular.  ST was noted.  There were no rubs.  ABDOMEN:  Soft.  Bowel sounds present.  EXTREMITIES:  2+ pitting edema.  NEUROLOGIC:  Alert and oriented.  Gait was not tested.   LABORATORY DATA:  Admission EKG showed atrial fibrillation with rapid  ventricular response.  Repeat EKG done on 05/24/2002 showed normal sinus  rhythm, LVH, with strain pattern versus ischemia in the high lateral wall.   Chest x-ray showed cardiomegaly with chronic vascular condition, no focal  infiltrates.   PSA normal at 0.09.  Cholesterol 181, LDL 113, HDL low at 26.  CPK  was 446  and 2.3.  Index was 0.09.  CPK-MB 0.5, troponin I 0.09, 0.05, and 0.05.  ?STAT CPK was 472, MB 4.3, relative index 0.9 which was negative.  Sodium  135, potassium 3.9, chloride 103, bicarb 25, blood glucose 262, BUN 21,  creatinine 1.1.  Hemoglobin 12.2, hematocrit 36.9, white count 5.7   HOSPITAL COURSE:  The patient was admitted to telemetry unit, started on IV  Lasix.  He was continued on his regular medications.  The patient responded  adequately to IV  Lasix which was switched to p.o..  The patient had one  episode of nonsustained ventricular tachycardia during hospital stay, but  the patient remained asymptomatic.  Due to multiple risk factors, recent CHF  and ventricular tachycardia, the patient underwent Persantine Cardiolite  yesterday which showed inferior wall scarring with no evidence of reversible  ischemia with ejection fraction of 30%.   The patient will be discharged home on the above medications.  The patient  was not started on anticoagulation in view of high risk of fall and recent  recurrent alcohol abuse.  The patient spontaneously converted to normal  sinus  rhythm and remained in sinus rhythm during the hospital stay.  If patient  has recurrent episodes of atrial fibrillation, will consider starting him on  anticoagulation as outpatient.  The patient has been advised to stop smoking  and drinking to which he agrees.                                               Eduardo Osier. Sharyn Lull, M.D.    MNH/MEDQ  D:  05/28/2002  T:  05/29/2002  Job:  [id number]

## 2010-07-07 NOTE — Discharge Summary (Signed)
[name], [name]                        ACCOUNT NO.:  [id number]   MEDICAL RECORD NO.:  [id number]                   PATIENT TYPE:  INP   LOCATION:  4735                                 FACILITY:  [facility]   PHYSICIAN:  Lorelle Formosa, M.D.           DATE OF BIRTH:  [birth year]   DATE OF ADMISSION:  12/31/2001  DATE OF DISCHARGE:  01/03/2002                                 DISCHARGE SUMMARY   ADMISSION DIAGNOSES:  1. Congestive heart failure.  2. Hypertension.  3. Non-insulin-dependent diabetes mellitus.  4. Smoking addiction.  5. Alcohol abuse.   DISCHARGE DIAGNOSES:  1. Congestive heart failure.  2. Hypertension.  3. Non-insulin-dependent diabetes mellitus.  4. Smoking addiction.  5. Alcohol abuse.   DISCHARGE DISPOSITION:  Follow up in approximately one to two weeks.   DISCHARGE MEDICINES:  1. Vasotec 40 mg daily.  2. Norvasc 10 mg daily.  3. Lasix 40 mg b.i.d.  4. Lopressor 50 mg b.i.d.  5. Glucotrol XL 10 mg daily.  6. Actos 30 mg daily.   HISTORY:  The patient is a 77 year old black male who has known non-insulin-  dependent diabetes mellitus, hypertension, history of congestive heart  failure and presented to the emergency room via emergency medical services  with complaint of not being able to breathe.  He was evaluated by the  emergency department physician and given a chest x-ray which showed  congestive heart failure.  Arterial blood gas revealed PCO2 of 55.7 with  HCO3 of 25, pH of 7.26.  EKG showed normal sinus rhythm with ST wave changes  consistent with abnormality, rule out ischemia.  He was given Lasix 80 mg  IV, nitroglycerin drip 3 cc/hr, and BiPAP 10/5 with a range of 10.  The  patient's home medicines include Glucotrol XL 10 mg daily, Glucophage XR 500  mg b.i.d., Norvasc 10 mg daily, Lopressor 50 mg b.i.d., aspirin 81 mg daily.  The patient improved and was thus off of BiPAP on 2 L of nasal cannula O2.  He was alert and oriented and thus  he was admitted for continued treatment  of congestive heart failure.   Past medical history, social history, family history and review of systems  are outlined in the admission history and physical.   PHYSICAL EXAMINATION:  Vital signs revealed blood pressure 210/120, pulse  100, respirations 26, pulse oximetry on room air was 85%.  The patient was  alert and oriented.  HEENT revealed PERRLA.  EOMs were intact.  The chest  was clear to auscultation.  Heart had a regular rhythm and rate without  murmur.  Abdomen was soft and slightly protuberant.  Extremities were  normal.  Neurological examination was grossly physiologic.   LABORATORY DATA:  EKG revealed normal sinus rhythm with minimal voltage  criteria for LVH.   Chest x-ray revealed interval improvement in the previously seen  interstitial pulmonary edematous changes.  On December 31, 2001, chest  x-ray  revealed increased edema and congestive heart failure.   Labs:  CBC revealed a white count of 10.1, a hemoglobin of 15.9, hematocrit  of 49, platelets 202,000.  Chemistries revealed a sodium of 143, potassium  3.5, chloride 108, glucose 185, BUN 17.  CK-MB was 4.7 with a CK of 123.  Troponin I was 0.04.  Repeat troponin was 0.05.   HOSPITAL COURSE:  The patient was admitted to the hospital and given K-Dur  20 mEq p.o. b.i.d., Lopressor 50 mg b.i.d., Lasix 40 mg IV q.12h., Vasotec  10 mg daily, Actos 30 mg daily, Glucotrol XL 10 mg daily.  He had  continuation of nitroglycerin drip until January 01, 2002, at which time it  was discontinued, and Vasotec was increased to 20 mg daily.  He was  clinically stable and had no further shortness of breath, thus he was on  room air, doing well and was discharged for outpatient management.                                                 Lorelle Formosa, M.D.    WWM/MEDQ  D:  02/05/2002  T:  02/07/2002  Job:  [id number]

## 2010-07-07 NOTE — Discharge Summary (Signed)
[name], [name]                        ACCOUNT NO.:  [id number]   MEDICAL RECORD NO.:  [id number]                   PATIENT TYPE:  INP   LOCATION:  4733                                 FACILITY:  [facility]   PHYSICIAN:  Mohan N. Sharyn Lull, M.D.              DATE OF BIRTH:  Jul 30, 1932   DATE OF ADMISSION:  02/06/2002  DATE OF DISCHARGE:  02/11/2002                                 DISCHARGE SUMMARY   ADMISSION DIAGNOSES:  1. Atypical chest pain rule out coronary insufficiency.  2. Probable bilateral pneumonia.  3. Good-controlled hypertension.  4. Non-insulin-dependent diabetes mellitus.  5. Hypercholesterolemia.  6. Mild congestive heart failure secondary to fluid overload.  7. Tobacco abuse.  8. History of alcohol abuse.  9. Chronic obstructive pulmonary disease.  10.      Cancer of prostate.   FINAL DIAGNOSES:  1. Status post bilateral pneumonia.  2. Status post atypical chest pain, rule out gastroesophageal reflex     disease.  3. Compensatory congestive heart failure.  4. Hypertension.  5. Non-insulin-dependent diabetes mellitus  6. Hypercholesterolemia.  7. Tobacco abuse.  8. History of alcohol abuse.  9. Chronic obstructive pulmonary disease.  10.      Cancer of prostate.   DISCHARGE MEDICATIONS:  1. Baby aspirin 81 mg 1 tablet daily.  2. Toprol XL 25 mg 1 tablet daily.  3. Altace 10 mg 1 capsule twice daily.  4. Cozaar 100 mg 1 tablet daily.  5. Imdur 60 mg 1 tablet daily in the morning.  6. Glucophage XR 50 mg 1 tablet daily with the evening meal.  7. Tequin 400 mg 1 tablet daily for 7 more days.   ACTIVITY:  As tolerated.   DIET:  Low-salt, low cholesterol, 1800-calorie ADA diet.   DISCHARGE INSTRUCTIONS:  Patient has been advised to monitor blood sugar  twice daily and BNP in one week. Patient was been advised to stop smoking  and drinking.  Followup with me in one week.   CONDITION ON DISCHARGE:  Stable.   BRIEF SUMMARY:  The patient is a  77 year old black male with past medical  history significant for coronary artery disease, hypertension, heart  disease, congestive heart failure, non insulin-dependent diabetes mellitus,  chronic obstructive pulmonary disease, alcohol abuse, tobacco abuse, history  of cancer of prostate status post TURP. He came to the emergency room  complaining of retrosternal chest pressure associated with cough, fever, and  chills. States chest pain was relieved with sublingual nitro in the  emergency room. Denies any palpitations, lightheadedness, or syncope. Denies  PND, orthopnea, leg swelling. Denies abdominal pain, nausea, urinary  complaints. States his breathing got worse, so he decided to come to the  emergency room.   PAST MEDICAL HISTORY:  As above.   PAST SURGICAL HISTORY:  Vasectomy in the past.   ALLERGIES:  NO KNOWN DRUG ALLERGIES.   MEDICATIONS:  1. Glucotrol.  2. Glucophage.  3. Questionable blood pressure medication, the dose of which he does not     remember.   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 Smoked one  pack a day for 20 years, now smokes 5 to 6 cigarettes per day. Used to drink  hard liquor 1/5 pint per day for 18+ years, quit in 1998.   FAMILY HISTORY:  Mother is in her 62's. Father died of myocardial infarction  at the age of 78. One brother has hypertension.   PHYSICAL EXAMINATION:  GENERAL:  He is alert and oriented x 3, in mild  respiratory distress.  VITAL SIGNS:  Blood pressure 191/96. Pulse 121. Temperature 104.  HEENT: Conjunctiva was pink.  NECK: Supple. No JVD. No bruit.  LUNGS: He has occasional rhonchi at the right base with expiratory wheezing  and decreased breath sounds at the left base.  CARDIOVASCULAR: S1, S2 was normal. There was soft systolic murmur and ______  gallop.  ABDOMEN: Soft. Bowel sounds are present. Nontender.  EXTREMITIES: No clubbing, cyanosis, or edema.   LABORATORY DATA:  EKG showed sinus tachycardia  with LVH with ____________  pattern versus ischemia. Hemoglobin 13, hematocrit 38.5, white count 8.8  with shift to the left. His BNP was slightly elevated to 21. CPK 122, MB  1.4, troponin 0.04, sodium 136, potassium 3.2, chloride 104, bicarb 21,  blood sugar 121, BUN 10, creatinine 1.0, hemoglobin A1C 8.8, d-dimer was  0.47 which was in normal range.  Blood cultures show cultures no gram  positive cocci in just one bottle;  in the second bottle no growth.  In  blood culture, one bottle was gram positive cocci Staph, coagulase negative,  probably contaminant.  Second bottle was negative.   BRIEF HOSPITAL COURSE:  Patient was admitted to telemetry unit and MI was  ruled out. Patient was started on both broad-spectrum antibiotics and  ______, chest x-ray obtained.  Patient became afebrile and remained afebrile  during the hospital stay. IV antibiotics were switched to p.o. Patient has  been tolerating p.o. antibiotics well and is  afebrile. Patient will be discharged on one of __________ medications.  His  blood sugar has been fairly well controlled during the hospital stay.  Patient will discharged home on above medications and will be followed up in  the office in one week.                                               Eduardo Osier. Sharyn Lull, M.D.    MNH/MEDQ  D:  02/11/2002  T:  02/13/2002  Job:  [id number]   cc:   Eduardo Osier. Sharyn Lull, M.D.  [address]  [place]  [place] [address]  Fax: [phone]

## 2010-07-07 NOTE — Discharge Summary (Signed)
NAMEPORTER, MOES                        ACCOUNT NO.:  [id number]   MEDICAL RECORD NO.:  [id number]                   PATIENT TYPE:  INP   LOCATION:  3731                                 FACILITY:  [facility]   PHYSICIAN:  Mohan N. Sharyn Lull, M.D.              DATE OF BIRTH:  12-Sep-1932   DATE OF ADMISSION:  01/29/2003  DATE OF DISCHARGE:  02/03/2003                                 DISCHARGE SUMMARY   ADMITTING DIAGNOSES:  1. Chest pain, rule out myocardial infarction.  2. Mild congestive heart failure.  3. Uncontrolled hypertension.  4. Dilated cardiomyopathy, multifactorial.  5. Non-insulin-dependent diabetes mellitus.  6. History of paroxysmal atrial fibrillation/paroxysmal nonsustained     ventricular tachycardia.  7. History of alcohol abuse.  8. Tobacco abuse.  9. Hypercholesterolemia.   DISCHARGE DIAGNOSES:  1. Stable angina.  2. Compensated congestive heart failure.  3. Hypertension.  4. Dilated cardiomyopathy.  5. Non-insulin-dependent diabetes mellitus.  6. History of paroxysmal atrial fibrillation and ventricular tachycardia.  7. History of alcohol abuse.  8. History of tobacco abuse.  9. Hypercholesterolemia.   DISCHARGE MEDICATIONS:  1. Coreg 6.25 mg one tablet q.12h.  2. Altace 10 mg one tablet b.i.d.  3. Hyzaar 100/25 mg one tablet daily.  4. Amiodarone 200 mg one tablet daily.  5. Imdur 120 mg one tablet daily in the morning.  6. Baby aspirin 81 mg one tablet daily.  7. Lipitor 20 mg one tablet daily.  8. Lasix 40 mg one tablet daily.  9. Amaryl 2 mg one tablet daily.  10.      Glucophage XR 500 mg one tablet b.i.d. starting from tomorrow.  11.      Nitrostat 0.4 mg sublingual.  Use as directed.  12.      Advair 250/50 one inhalation b.i.d. as before.   ACTIVITY:  Avoid heavy lifting, pushing, or pulling for 48 hours.   DIET:  Low salt, low cholesterol, 1800 calorie ADA diet.  The patient has  been advised to monitor blood sugar daily.   DISCHARGE  INSTRUCTIONS:  Post cardiac catheterization instructions have been  given.  Follow up with me in one week.   CONDITION ON DISCHARGE:  Stable.   BRIEF HISTORY:  Mr. Remmers is a 77 year old black male with past medical  history significant for multiple medical problems, i.e., coronary artery  disease, dilated cardiomyopathy, history of congestive heart failure,  hypertension, non-insulin-dependent diabetes mellitus, history of alcohol  abuse, tobacco abuse, history of paroxysmal atrial fibrillation with rapid  ventricular response, history of nonsustained VT, hypercholesterolemia, CA  of prostate, COPD.  He came to the ER complaining of retrosternal and right-  sided chest tightness radiating both arms associated with shortness of  breath.  Denies any nausea, vomiting, or diaphoresis.  Denies palpitations,  lightheadedness, or syncope.  Denies PND, orthopnea, or leg swelling.  The  patient received sublingual nitroglycerin in the ER with relief of  chest  pain.  Presently the patient denies any chest pain.   PAST MEDICAL HISTORY:  As above.   PAST SURGICAL HISTORY:  Prostatectomy in the past.   ALLERGIES:  No known drug allergies.   MEDICATIONS:  1. Coreg 3.125 mg p.o. q.12h.  2. Altace 10 mg p.o. b.i.d.  3. Hyzaar 100/25 p.o. daily.  4. Amiodarone 200 mg p.o. daily.  5. Imdur 60 mg p.o. daily.  6. Baby aspirin 81 mg p.o. daily.  7. Lipitor 10 mg p.o. daily.  8. Glucophage XR 500 mg p.o. b.i.d.  9. Amaryl 2 mg p.o. daily.  10.      Advair 250/50 b.i.d.   SOCIAL HISTORY:  Single, retired.  Works as Education administrator.  Smokes one to two  packs per day for 20+ years.  Used to drink hard liquor for 20-25 years,  quit a few months ago.   FAMILY HISTORY:  Positive for coronary artery disease.   PHYSICAL EXAMINATION:  GENERAL:  He is alert, awake, oriented x3, in no  acute distress.  VITAL SIGNS:  Blood pressure 193/99, pulse 174, regular.  HEENT:  Conjunctiva was pink.  NECK:  Supple.  No  JVD.  No bruit.  LUNGS:  Decreased breath sounds with occasional rales at the bases.  CARDIOVASCULAR:  S1, S2 was normal.  There was soft S3 and S4 gallop.  ABDOMEN:  Soft.  Bowel sounds were present.  Nontender.  EXTREMITIES:  There is no clubbing, cyanosis, edema.   LABORATORIES:  EKG showed normal sinus rhythm with LVH with strain pattern.  His other laboratories:  TSH 3.27.  Cholesterol 182, triglyceride 167, LDL  116, HDL 33.  His hemoglobin A1C was 7.0.  AST 18, ALT 17, alkaline  phosphatase 52, total bilirubin 0.5, lipase 23.  Sodium 142, potassium 3.4,  chloride 106, bicarbonate 27, glucose 131, BUN 14, creatinine 1.2.  Hemoglobin 7.4, hematocrit 37.6, white count 9.6.  Three sets of cardiac  enzymes from the ER were negative.   HOSPITAL COURSE:  The patient was admitted to telemetry unit.  MI was ruled  out by serial enzymes and EKG.  Discussed with patient and family regarding  various options of treatment, i.e., noninvasive stress testing versus left  catheterization, possible PTCA/stenting.  Family and patient consented for  invasive procedure.  The patient underwent left cardiac catheterization with  selective left and right coronary angiography, LV graphy, and aortography  yesterday.  As per procedure report patient tolerated procedure well.  There  were no complications.  Due to moderate lesion in LAD, patient underwent  Persantine Cardiolite today which showed no evidence of reversible ischemia  with EF of 37%.  The patient did not have any further episodes of chest pain  during the hospital stay.  His groin is stable.  There is no evidence of  hematoma or bruit.  The patient will be discharged home on above medications  and will be followed up in my office in one week.                                                Eduardo Osier. Sharyn Lull, M.D.    [facility]  D:  02/03/2003  T:  02/04/2003  Job:  [id number]

## 2010-07-09 ENCOUNTER — Other Ambulatory Visit: Payer: Self-pay | Admitting: Internal Medicine

## 2010-07-13 ENCOUNTER — Other Ambulatory Visit: Payer: Medicare Other

## 2010-07-13 ENCOUNTER — Other Ambulatory Visit (INDEPENDENT_AMBULATORY_CARE_PROVIDER_SITE_OTHER): Payer: Medicare Other

## 2010-07-13 DIAGNOSIS — I509 Heart failure, unspecified: Secondary | ICD-10-CM

## 2010-07-13 DIAGNOSIS — I5032 Chronic diastolic (congestive) heart failure: Secondary | ICD-10-CM

## 2010-07-13 DIAGNOSIS — E875 Hyperkalemia: Secondary | ICD-10-CM

## 2010-07-13 LAB — BASIC METABOLIC PANEL
BUN: 70 mg/dL — ABNORMAL HIGH (ref 6–23)
Chloride: 98 mEq/L (ref 96–112)
Creatinine, Ser: 2.3 mg/dL — ABNORMAL HIGH (ref 0.4–1.5)

## 2010-07-13 NOTE — Progress Notes (Signed)
Addended by: Meredith Staggers on: 07/13/2010 12:22 PM   Modules accepted: Orders

## 2010-07-21 ENCOUNTER — Telehealth: Payer: Self-pay | Admitting: *Deleted

## 2010-07-21 DIAGNOSIS — I5022 Chronic systolic (congestive) heart failure: Secondary | ICD-10-CM

## 2010-07-21 NOTE — Telephone Encounter (Signed)
Was finally able to get in touch w/pt's daughter [name] regarding elevated potassium level last week, she states she did get my mess and stopped his spiro, she is unable to get him here for labs until Mon or Tue next week, order placed

## 2010-07-24 ENCOUNTER — Other Ambulatory Visit: Payer: Medicare Other | Admitting: *Deleted

## 2010-07-26 ENCOUNTER — Other Ambulatory Visit (INDEPENDENT_AMBULATORY_CARE_PROVIDER_SITE_OTHER): Payer: Medicare Other

## 2010-07-26 DIAGNOSIS — I5022 Chronic systolic (congestive) heart failure: Secondary | ICD-10-CM

## 2010-07-26 LAB — BASIC METABOLIC PANEL
BUN: 25 mg/dL — ABNORMAL HIGH (ref 6–23)
Calcium: 8.8 mg/dL (ref 8.4–10.5)
Creatinine, Ser: 1.6 mg/dL — ABNORMAL HIGH (ref 0.4–1.5)
GFR: 53.54 mL/min — ABNORMAL LOW (ref >60.00)
Glucose, Bld: 191 mg/dL — ABNORMAL HIGH (ref 70–99)
Sodium: 136 mEq/L (ref 135–145)

## 2010-07-31 ENCOUNTER — Other Ambulatory Visit: Payer: Self-pay | Admitting: Emergency Medicine

## 2010-07-31 MED ORDER — ALLOPURINOL 100 MG PO TABS: 100.0000 mg | ORAL_TABLET | Freq: Every day | ORAL | Status: DC

## 2010-08-03 ENCOUNTER — Other Ambulatory Visit: Payer: Self-pay | Admitting: *Deleted

## 2010-08-03 MED ORDER — ALLOPURINOL 100 MG PO TABS: 100.0000 mg | ORAL_TABLET | Freq: Every day | ORAL | Status: DC

## 2010-08-04 ENCOUNTER — Telehealth: Payer: Self-pay | Admitting: Cardiology

## 2010-08-04 DIAGNOSIS — I5032 Chronic diastolic (congestive) heart failure: Secondary | ICD-10-CM

## 2010-08-04 NOTE — Telephone Encounter (Signed)
Order put in for a bmp on 6/20

## 2010-08-09 ENCOUNTER — Other Ambulatory Visit: Payer: Medicare Other | Admitting: *Deleted

## 2010-08-10 ENCOUNTER — Other Ambulatory Visit (INDEPENDENT_AMBULATORY_CARE_PROVIDER_SITE_OTHER): Payer: Medicare Other

## 2010-08-10 DIAGNOSIS — I509 Heart failure, unspecified: Secondary | ICD-10-CM

## 2010-08-10 DIAGNOSIS — I5032 Chronic diastolic (congestive) heart failure: Secondary | ICD-10-CM

## 2010-08-10 LAB — BASIC METABOLIC PANEL
Calcium: 9.3 mg/dL (ref 8.4–10.5)
Chloride: 101 mEq/L (ref 96–112)
Creatinine, Ser: 2 mg/dL — ABNORMAL HIGH (ref 0.4–1.5)
GFR: 43.22 mL/min — ABNORMAL LOW (ref >60.00)

## 2010-08-16 ENCOUNTER — Telehealth: Payer: Self-pay | Admitting: *Deleted

## 2010-08-16 DIAGNOSIS — I5022 Chronic systolic (congestive) heart failure: Secondary | ICD-10-CM

## 2010-08-16 NOTE — Telephone Encounter (Signed)
[name] aware will come for repeat labs 7/5 or 7/6

## 2010-08-16 NOTE — Telephone Encounter (Signed)
Message copied by Noralee Space on Wed Aug 16, 2010 10:29 AM ------      Message from: Dolores Patty      Created: Sun Aug 13, 2010 10:50 PM       Relatively stable. Repeat 2 weeks.

## 2010-08-25 ENCOUNTER — Other Ambulatory Visit: Payer: Medicare Other

## 2010-08-31 ENCOUNTER — Other Ambulatory Visit (INDEPENDENT_AMBULATORY_CARE_PROVIDER_SITE_OTHER): Payer: Medicare Other

## 2010-08-31 DIAGNOSIS — I5022 Chronic systolic (congestive) heart failure: Secondary | ICD-10-CM

## 2010-08-31 LAB — BASIC METABOLIC PANEL
GFR: 54.3 mL/min — ABNORMAL LOW (ref >60.00)
Potassium: 3.9 mEq/L (ref 3.5–5.1)
Sodium: 138 mEq/L (ref 135–145)

## 2010-09-04 ENCOUNTER — Telehealth: Payer: Self-pay | Admitting: *Deleted

## 2010-09-04 MED ORDER — INSULIN PEN NEEDLE 31G X 8 MM MISC: 1.0000 | Freq: Every day | Status: DC

## 2010-09-04 MED ORDER — INSULIN DETEMIR 100 UNIT/ML ~~LOC~~ SOLN: 100.0000 [IU] | Freq: Every day | SUBCUTANEOUS | Status: DC

## 2010-09-04 NOTE — Telephone Encounter (Signed)
Rx Done. Attempted to contact patient. No answer/no ans machine.

## 2010-09-04 NOTE — Telephone Encounter (Signed)
Please refill x 1 Ov is due  

## 2010-09-04 NOTE — Telephone Encounter (Signed)
Caller states that patient's insulin amount was increased, therefore they need a new Rx for Insulin & needles sent to [facility].

## 2010-09-05 NOTE — Telephone Encounter (Signed)
No answer, no VM closing phone note until patient calls

## 2010-09-19 ENCOUNTER — Other Ambulatory Visit: Payer: Self-pay

## 2010-09-19 MED ORDER — LEVOTHYROXINE SODIUM 125 MCG PO TABS: 125.0000 ug | ORAL_TABLET | Freq: Every day | ORAL | Status: DC

## 2010-09-21 ENCOUNTER — Encounter: Payer: Self-pay | Admitting: Internal Medicine

## 2010-09-21 ENCOUNTER — Inpatient Hospital Stay (HOSPITAL_COMMUNITY)
Admission: EM | Admit: 2010-09-21 | Discharge: 2010-09-27 | DRG: 291 | Disposition: A | Discharge status: Home / Self Care | Payer: Medicare Other | Attending: Internal Medicine | Admitting: Internal Medicine

## 2010-09-21 ENCOUNTER — Ambulatory Visit (INDEPENDENT_AMBULATORY_CARE_PROVIDER_SITE_OTHER): Payer: Medicare Other | Admitting: Internal Medicine

## 2010-09-21 ENCOUNTER — Ambulatory Visit (INDEPENDENT_AMBULATORY_CARE_PROVIDER_SITE_OTHER)
Admission: RE | Admit: 2010-09-21 | Discharge: 2010-09-21 | Disposition: A | Discharge status: Home / Self Care | Payer: Medicare Other | Source: Ambulatory Visit | Attending: Internal Medicine | Admitting: Internal Medicine

## 2010-09-21 ENCOUNTER — Other Ambulatory Visit (INDEPENDENT_AMBULATORY_CARE_PROVIDER_SITE_OTHER): Payer: Medicare Other

## 2010-09-21 ENCOUNTER — Emergency Department (HOSPITAL_COMMUNITY): Payer: Medicare Other

## 2010-09-21 VITALS — BP 180/64 | HR 78 | Temp 99.1°F | Resp 16 | Wt 229.0 lb

## 2010-09-21 DIAGNOSIS — R059 Cough, unspecified: Secondary | ICD-10-CM | POA: Insufficient documentation

## 2010-09-21 DIAGNOSIS — R05 Cough: Secondary | ICD-10-CM

## 2010-09-21 DIAGNOSIS — E119 Type 2 diabetes mellitus without complications: Secondary | ICD-10-CM | POA: Diagnosis present

## 2010-09-21 DIAGNOSIS — I4891 Unspecified atrial fibrillation: Secondary | ICD-10-CM

## 2010-09-21 DIAGNOSIS — I5033 Acute on chronic diastolic (congestive) heart failure: Principal | ICD-10-CM | POA: Diagnosis present

## 2010-09-21 DIAGNOSIS — F3289 Other specified depressive episodes: Secondary | ICD-10-CM | POA: Diagnosis present

## 2010-09-21 DIAGNOSIS — E0590 Thyrotoxicosis, unspecified without thyrotoxic crisis or storm: Secondary | ICD-10-CM

## 2010-09-21 DIAGNOSIS — N183 Chronic kidney disease, stage 3 unspecified: Secondary | ICD-10-CM | POA: Diagnosis present

## 2010-09-21 DIAGNOSIS — I5032 Chronic diastolic (congestive) heart failure: Secondary | ICD-10-CM

## 2010-09-21 DIAGNOSIS — G4733 Obstructive sleep apnea (adult) (pediatric): Secondary | ICD-10-CM | POA: Diagnosis present

## 2010-09-21 DIAGNOSIS — Z794 Long term (current) use of insulin: Secondary | ICD-10-CM | POA: Diagnosis present

## 2010-09-21 DIAGNOSIS — R0989 Other specified symptoms and signs involving the circulatory and respiratory systems: Secondary | ICD-10-CM

## 2010-09-21 DIAGNOSIS — I129 Hypertensive chronic kidney disease with stage 1 through stage 4 chronic kidney disease, or unspecified chronic kidney disease: Secondary | ICD-10-CM | POA: Diagnosis present

## 2010-09-21 DIAGNOSIS — Z79899 Other long term (current) drug therapy: Secondary | ICD-10-CM

## 2010-09-21 DIAGNOSIS — I5022 Chronic systolic (congestive) heart failure: Secondary | ICD-10-CM

## 2010-09-21 DIAGNOSIS — E039 Hypothyroidism, unspecified: Secondary | ICD-10-CM | POA: Diagnosis present

## 2010-09-21 DIAGNOSIS — I509 Heart failure, unspecified: Secondary | ICD-10-CM

## 2010-09-21 DIAGNOSIS — IMO0001 Reserved for inherently not codable concepts without codable children: Secondary | ICD-10-CM

## 2010-09-21 DIAGNOSIS — E785 Hyperlipidemia, unspecified: Secondary | ICD-10-CM | POA: Diagnosis present

## 2010-09-21 DIAGNOSIS — J189 Pneumonia, unspecified organism: Secondary | ICD-10-CM

## 2010-09-21 DIAGNOSIS — D631 Anemia in chronic kidney disease: Secondary | ICD-10-CM | POA: Diagnosis present

## 2010-09-21 DIAGNOSIS — Z8711 Personal history of peptic ulcer disease: Secondary | ICD-10-CM

## 2010-09-21 DIAGNOSIS — I10 Essential (primary) hypertension: Secondary | ICD-10-CM

## 2010-09-21 DIAGNOSIS — J9610 Chronic respiratory failure, unspecified whether with hypoxia or hypercapnia: Secondary | ICD-10-CM | POA: Diagnosis present

## 2010-09-21 DIAGNOSIS — Z8673 Personal history of transient ischemic attack (TIA), and cerebral infarction without residual deficits: Secondary | ICD-10-CM

## 2010-09-21 DIAGNOSIS — J90 Pleural effusion, not elsewhere classified: Secondary | ICD-10-CM | POA: Diagnosis present

## 2010-09-21 DIAGNOSIS — N039 Chronic nephritic syndrome with unspecified morphologic changes: Secondary | ICD-10-CM | POA: Diagnosis present

## 2010-09-21 DIAGNOSIS — Z8546 Personal history of malignant neoplasm of prostate: Secondary | ICD-10-CM

## 2010-09-21 DIAGNOSIS — I2699 Other pulmonary embolism without acute cor pulmonale: Secondary | ICD-10-CM | POA: Diagnosis present

## 2010-09-21 DIAGNOSIS — F329 Major depressive disorder, single episode, unspecified: Secondary | ICD-10-CM | POA: Diagnosis present

## 2010-09-21 DIAGNOSIS — M109 Gout, unspecified: Secondary | ICD-10-CM | POA: Diagnosis present

## 2010-09-21 LAB — DIFFERENTIAL
Basophils Relative: 0 % (ref 0–1)
Eosinophils Relative: 7 % — ABNORMAL HIGH (ref 0–5)
Lymphs Abs: 1.2 10*3/uL (ref 0.7–4.0)
Monocytes Relative: 6 % (ref 3–12)
Neutro Abs: 8.5 10*3/uL — ABNORMAL HIGH (ref 1.7–7.7)

## 2010-09-21 LAB — CBC WITH DIFFERENTIAL/PLATELET
Eosinophils Relative: 6.8 % — ABNORMAL HIGH (ref 0.0–5.0)
HCT: 29.5 % — ABNORMAL LOW (ref 39.0–52.0)
Hemoglobin: 9 g/dL — ABNORMAL LOW (ref 13.0–17.0)
Lymphs Abs: 0.8 10*3/uL (ref 0.7–4.0)
MCV: 71.2 fl — ABNORMAL LOW (ref 78.0–100.0)
Monocytes Absolute: 0.7 10*3/uL (ref 0.1–1.0)
Neutro Abs: 8.6 10*3/uL — ABNORMAL HIGH (ref 1.4–7.7)
Platelets: 519 10*3/uL — ABNORMAL HIGH (ref 150.0–400.0)
RDW: 20.3 % — ABNORMAL HIGH (ref 11.5–14.6)
WBC: 10.9 10*3/uL — ABNORMAL HIGH (ref 4.5–10.5)

## 2010-09-21 LAB — CK TOTAL AND CKMB (NOT AT ARMC)
CK, MB: 3.2 ng/mL (ref 0.3–4.0)
Relative Index: 3.2 — ABNORMAL HIGH (ref 0.0–2.5)

## 2010-09-21 LAB — COMPREHENSIVE METABOLIC PANEL
ALT: 14 U/L (ref 0–53)
AST: 15 U/L (ref 0–37)
Albumin: 2.8 g/dL — ABNORMAL LOW (ref 3.5–5.2)
Alkaline Phosphatase: 68 U/L (ref 39–117)
Alkaline Phosphatase: 78 U/L (ref 39–117)
BUN: 29 mg/dL — ABNORMAL HIGH (ref 6–23)
Calcium: 10 mg/dL (ref 8.4–10.5)
Creatinine, Ser: 1.5 mg/dL (ref 0.4–1.5)
GFR calc Af Amer: 60 mL/min — ABNORMAL LOW (ref >60)
Glucose, Bld: 238 mg/dL — ABNORMAL HIGH (ref 70–99)
Potassium: 4.3 mEq/L (ref 3.5–5.1)
Sodium: 139 mEq/L (ref 135–145)
Sodium: 138 mEq/L (ref 135–145)
Total Bilirubin: 0.3 mg/dL (ref 0.3–1.2)
Total Protein: 7.5 g/dL (ref 6.0–8.3)

## 2010-09-21 LAB — TROPONIN I: Troponin I: <0.3 ng/mL (ref <0.30)

## 2010-09-21 LAB — CBC
HCT: 28.8 % — ABNORMAL LOW (ref 39.0–52.0)
Hemoglobin: 8.6 g/dL — ABNORMAL LOW (ref 13.0–17.0)
MCH: 21.1 pg — ABNORMAL LOW (ref 26.0–34.0)
RBC: 4.07 MIL/uL — ABNORMAL LOW (ref 4.22–5.81)

## 2010-09-21 LAB — GLUCOSE, CAPILLARY: Glucose-Capillary: 159 mg/dL — ABNORMAL HIGH (ref 70–99)

## 2010-09-21 LAB — PRO B NATRIURETIC PEPTIDE: Pro B Natriuretic peptide (BNP): 2588 pg/mL — ABNORMAL HIGH (ref 0–450)

## 2010-09-21 LAB — TSH: TSH: 2.33 u[IU]/mL (ref 0.35–5.50)

## 2010-09-21 LAB — BRAIN NATRIURETIC PEPTIDE: Pro B Natriuretic peptide (BNP): 639 pg/mL — ABNORMAL HIGH (ref 0.0–100.0)

## 2010-09-21 MED ORDER — TORSEMIDE 20 MG PO TABS: 20.0000 mg | ORAL_TABLET | Freq: Two times a day (BID) | ORAL | Status: DC

## 2010-09-21 MED ORDER — SPIRONOLACTONE 25 MG PO TABS: 25.0000 mg | ORAL_TABLET | Freq: Every day | ORAL | Status: DC

## 2010-09-21 NOTE — Assessment & Plan Note (Signed)
He has good rate control 

## 2010-09-21 NOTE — Assessment & Plan Note (Signed)
Restart his diuretics, check cxr for edema, monitor renal function and BNP to see how severe the CHF is, I have asked for an urgent cardiology f/up

## 2010-09-21 NOTE — Patient Instructions (Signed)
Heart Failure Heart failure (HF) is a condition in which the heart has trouble pumping blood. This means your heart does not pump blood efficiently for your body to work well. In some cases of HF, fluid may back up into your lungs or you may have swelling (edema) in your lower legs. HF is a long-term (chronic) condition. It is important for you to take good care of yourself and follow your caregiver's treatment plan. CAUSES  Health conditions:   High blood pressure (hypertension) causes the heart muscle to work harder than normal. When pressure in the blood vessels is high, the heart needs to pump (contract) with more force in order to circulate blood throughout the body. High blood pressure eventually causes the heart to become stiff and weak.   Coronary artery disease (CAD) is the buildup of cholesterol and fat (plaques) in the arteries of the heart. The blockage in the arteries deprives the heart muscle of oxygen and blood. This can cause chest pain and may lead to a heart attack. CAD can also contribute to high blood pressure.   Heart attack (myocardial infarction) occurs when 1 or more arteries in the heart become blocked. The loss of oxygen damages the muscle tissue of the heart. When this happens, part of the heart muscle dies. The injured tissue does not contract as well and weakens the heart's ability to pump blood.   Abnormal heart valves can cause HF when the heart valves do not open and close properly. This makes the heart muscle pump harder to keep the blood flowing.   Heart muscle disease (cardiomyopathy or myocarditis) is damage to the heart muscle from a variety of causes. These can include drug or alcohol abuse, infections, or unknown reasons. These can increase the risk of HF.   Lung disease makes the heart work harder because the lungs do not work properly. This can cause a strain on the heart leading it to fail.   Diabetes increases the risk of HF. High blood sugar contributes  to high fat (lipid) levels in the blood. Diabetes can also cause slow damage to tiny blood vessels that carry important nutrients to the heart muscle. When the heart does not get enough oxygen and food, it can cause the heart to become weak and stiff. This leads to a heart that does not contract efficiently.   Other diseases can contribute to HF. These include abnormal heart rhythms, thyroid problems and low blood counts (anemia).   Unhealthy lifestyle habits:   Obesity.   Smoking.   Eating foods high in fat and cholesterol.   Eating or drinking beverages high in salt.   Drug or alcohol abuse.   Lack of exercise.  SYMPTOMS HF symptoms may vary and can be hard to detect. Symptoms may include:  Shortness of breath with activity, such as climbing stairs.   Persistent cough.   Swelling of the feet, ankles, legs, or abdomen.   Unexplained weight gain.   Difficulty breathing when lying flat.   Waking from sleep because of the need to sit up and get more air.   Rapid heartbeat.   Fatigue and loss of energy.   Feeling lightheaded or close to fainting.  DIAGNOSIS A diagnosis of HF is based on your history, symptoms, physical examination, and diagnostic tests. Diagnostic tests for HF may include:  EKG.  Chest X-ray.   Blood tests.   Exercise stress test.   Blood oxygen test (arterial blood gas).   Evaluation by a heart doctor (  cardiologist).  Ultrasound evaluation of the heart (echocardiogram).   Heart artery test to look for blockages (angiogram).   Radioactive imaging to look at the heart (radionuclide test).   TREATMENT Treatment is aimed at managing the symptoms of HF. Medicines, lifestyle changes, or surgical intervention may be necessary to treat HF.  Medicines to help treat HF may include:   Angiotensin-converting enzyme (ACE) inhibitors. These block the effects of a blood protein called angiotensin-converting enzyme. ACE inhibitors relax (dilate) the blood  vessels and help lower blood pressure. This decreases the workload of the heart, slows the progression of HF, and improves symptoms.   Angiotensin receptor blockers (ARBs). Medications in this drug class work in a way similar to ACE inhibitors. ARBs may be an alternative for people who cannot tolerate an ACE inhibitor.   Aldosterone Antagonists. This medication helps get rid of extra fluid from your body. This lowers the volume of blood the heart has to pump.   Water pills (diuretics). Diuretics cause the kidneys to remove salt and water from the blood. The extra fluid is removed by urination. By removing extra fluid from the body, diuretics help lower the workload of the heart and help prevent fluid buildup in the lungs so breathing is easier.   Beta blockers. These prevent the heart from beating too fast and improve heart muscle strength. Beta blockers help maintain a normal heart rate, control blood pressure, and improve HF symptoms.   Digitalis. This increases the force of the heartbeat and may be helpful to people with HF or heart rhythm problems.   Healthy lifestyle changes include:   Stopping smoking.   Eating a healthy diet. Avoid foods high in fat. Avoid foods fried in oil or made with fat. A dietician can help with healthy food choices.   Limiting how much salt you eat.   Limiting alcohol intake to no more than 1 drink per day for women and 2 drinks per day for men. Drinking more than that is harmful to your heart. If your heart has already been damaged by alcohol or you have severe HF, drinking alcohol should be stopped completely.   Exercising as directed by your caregiver.   Surgical treatment for HF may include:   Procedures to open blocked arteries, repair damaged heart valves, or remove damaged heart muscle tissue.   A pacemaker to help heart muscle function and to control certain abnormal heart rhythms.   A defibrillator to possibly prevent sudden cardiac death.  HOME  CARE INSTRUCTIONS  Activity level. Your caregiver can help you determine what type of exercise program may be helpful. It is important to maintain your strength. Pace your physical activity to avoid shortness of breath or chest pain. Rest for 1 hour before and after meals. A cardiac rehabilitation program may be helpful to some people with HF.   Diet. Eat a heart-healthy diet. Food choices should be low in saturated fat and cholesterol. Talk to a dietician to learn about heart healthy foods.   Salt intake. When you have HF, you need to limit the amount of salt you eat. Eat less than   milligrams (mg) of salt per day or as recommended by your caregiver.   Weight monitoring. Weigh yourself everyday. You should weigh yourself in the morning after you urinate and before you eat breakfast. Wear the same amount of clothing each time you weigh yourself. Record your weight daily. Bring your recorded weights to your clinic visits. Tell your caregiver right away if you  have gained 2 lb in 1 day, or 5 lb in a week or whatever amount you were told to report.   Blood pressure monitoring. This should be done as directed by your caregiver. A home blood pressure cuff can be purchased at a drugstore. Record your blood pressure numbers and bring them to your clinic visits. Tell your caregiver if you become dizzy or lightheaded upon standing up.   Smoking. If you are currently a smoker, it is time to quit. Nicotine makes your heart work harder by causing your blood vessels to constrict. Do not use nicotine gum or patches before talking to your caregiver.   Follow up. Be sure to schedule a follow-up visit with your caregiver. Keep all your appointments.  SEEK MEDICAL CARE IF:  Your weight increases by 2 lb in 1 day or 5 lb in a week.   You notice increasing shortness of breath that is unusual for you. This may happen during rest, sleep or with activity.   You cough more than normal, especially with physical  activity.   You notice more swelling in your hands, feet, ankles, or belly (abdomen).   You are unable to sleep because it is hard to breathe.   You cough up bloody mucus (sputum).   You begin to feel "jumping" or "fluttering" sensations (palpitations) in your chest.  SEEK IMMEDIATE MEDICAL CARE IF:  You have severe chest pain or pressure which may include symptoms such as:   Pain or pressure in the arms, neck, jaw, or back.   Feeling sweaty.   Feeling sick to your stomach (nauseous).   Feeling short of breath while at rest.   Having a fast or irregular heartbeat.   You experience stroke symptoms. These symptoms include:   Facial weakness or numbness.   Weakness or numbness in an arm, leg or on one side of your body.   Blurred vision.   Difficulty talking or thinking.   Dizziness or fainting.   Severe headache.  THESE ARE MEDICAL EMERGENCIES. Do not wait to see if the symptoms go away. Call your local emergency services (911 in [place] DO NOT drive yourself to the hospital. IMPORTANT  Make a list of every medicine, vitamin, or herbal supplement you are taking. Keep the list with you at all times. Show it to your caregiver at every visit. Keep the list up-to-date.   Ask your caregiver or pharmacist to write an explanation of each medicine you are taking. This should include:   Why you are taking it.   The possible side effects.   The best time of day to take it.   Foods to take with it or what foods to avoid.   When to stop taking it.  MAKE SURE YOU:   Understand these instructions.   Will watch your condition.   Will get help right away if you are not doing well or get worse.  Document Released: 02/05/2005 Document Re-Released: 07/26/2009 [facility] Patient Information 2011 [place], [place].

## 2010-09-21 NOTE — Assessment & Plan Note (Signed)
Chest xray was ordered to look for edema, pna, mass

## 2010-09-21 NOTE — Assessment & Plan Note (Signed)
I will check his TSH, T3, and T4

## 2010-09-21 NOTE — Assessment & Plan Note (Signed)
I will check his A1C today 

## 2010-09-21 NOTE — Assessment & Plan Note (Signed)
His BP is too high so I will restart his diuretics

## 2010-09-21 NOTE — Progress Notes (Signed)
Subjective:    Patient ID: [name], male    DOB: 04-11-1932, 77 y.o.   MRN: [id number]  HPI New to me he comes in emergently c/o a death in the family that has made him feel anxious and subsequently he has not been compliant with his diuretic therapy, his son is with him today and he tells me that his sister usually handles his dad's meds but she has been unavailable so some doses have been missed and now he is developing a cough, SOB, DOE, leg edema, and "swollen" stomach.   Review of Systems  Constitutional: Positive for unexpected weight change (weight gain). Negative for fever, chills, diaphoresis, activity change, appetite change and fatigue.  HENT: Negative for sore throat, facial swelling, trouble swallowing, neck pain, neck stiffness and voice change.   Eyes: Negative for photophobia, redness and visual disturbance.  Respiratory: Positive for cough and shortness of breath. Negative for apnea, choking, chest tightness, wheezing and stridor.   Cardiovascular: Positive for leg swelling. Negative for chest pain and palpitations.  Gastrointestinal: Negative for nausea, vomiting, abdominal pain, diarrhea and constipation.  Genitourinary: Negative for dysuria, urgency, frequency, hematuria, decreased urine volume, enuresis, difficulty urinating and testicular pain.  Musculoskeletal: Negative for myalgias, back pain, joint swelling, arthralgias and gait problem.  Skin: Negative for color change, pallor, rash and wound.  Neurological: Negative for dizziness, tremors, seizures, syncope, facial asymmetry, speech difficulty, weakness, light-headedness, numbness and headaches.  Hematological: Negative for adenopathy. Does not bruise/bleed easily.  Psychiatric/Behavioral: Negative for suicidal ideas, hallucinations, behavioral problems, confusion, sleep disturbance, self-injury, dysphoric mood, decreased concentration and agitation. The patient is nervous/anxious. The patient is not  hyperactive.        Objective:   Physical Exam  Vitals reviewed. Constitutional: He is oriented to person, place, and time. He appears well-developed and well-nourished. No distress.  HENT:  Head: Normocephalic and atraumatic.  Right Ear: External ear normal.  Left Ear: External ear normal.  Nose: Nose normal.  Mouth/Throat: Oropharynx is clear and moist. No oropharyngeal exudate.  Eyes: Conjunctivae and EOM are normal. Pupils are equal, round, and reactive to light. Right eye exhibits no discharge. Left eye exhibits no discharge. No scleral icterus.  Neck: Normal range of motion. Neck supple. No JVD present. No tracheal deviation present. No thyromegaly present.  Cardiovascular: Normal rate, S1 normal, S2 normal and normal pulses.  An irregularly irregular rhythm present. PMI is not displaced.  Exam reveals gallop and S3. Exam reveals no S4, no distant heart sounds and no friction rub.   No murmur heard. Pulmonary/Chest: Effort normal. No stridor. Not tachypneic. No respiratory distress. He has no decreased breath sounds. He has no wheezes. He has no rhonchi. He has rales in the right lower field and the left lower field. He exhibits no tenderness.  Abdominal: Soft. Bowel sounds are normal. He exhibits no distension and no mass. There is no tenderness. There is no rebound and no guarding.  Musculoskeletal: Normal range of motion. He exhibits edema (2+ edema in both legs). He exhibits no tenderness.  Lymphadenopathy:    He has no cervical adenopathy.  Neurological: He is alert and oriented to person, place, and time. He has normal reflexes.  Skin: Skin is warm and dry. No rash noted. He is not diaphoretic. No erythema. No pallor.  Psychiatric: He has a normal mood and affect. His behavior is normal. Judgment and thought content normal.      Lab Results  Component Value Date   WBC 6.6  07/03/2010   HGB 9.9* 07/03/2010   HCT 30.8* 07/03/2010   PLT 497.0* 07/03/2010   CHOL 131 05/29/2010    TRIG 126.0 05/29/2010   HDL 32.80* 05/29/2010   LDLDIRECT 83.9 09/19/2009   ALT 25 06/19/2010   AST 24 06/19/2010   NA 138 08/31/2010   K 3.9 08/31/2010   CL 99 08/31/2010   CREATININE 1.6* 08/31/2010   BUN 30* 08/31/2010   CO2 28 08/31/2010   TSH 3.09 05/29/2010   PSA 0.68 05/29/2010   INR 1.06 06/20/2010   HGBA1C  Value: 9.9 (NOTE)                                                                       According to the ADA Clinical Practice Recommendations for 2011, when HbA1c is used as a screening test:   >=6.5%   Diagnostic of Diabetes Mellitus           (if abnormal result  is confirmed)  5.7-6.4%   Increased risk of developing Diabetes Mellitus  References:Diagnosis and Classification of Diabetes Mellitus,Diabetes Care,2011,34(Suppl 1):S62-S69 and Standards of Medical Care in         Diabetes - 2011,Diabetes Care,2011,34  (Suppl 1):S11-S61.* 06/21/2010   MICROALBUR 37.3* 10/11/2006      Assessment & Plan:

## 2010-09-22 ENCOUNTER — Emergency Department (HOSPITAL_COMMUNITY): Payer: Medicare Other

## 2010-09-22 ENCOUNTER — Encounter (HOSPITAL_COMMUNITY): Payer: Self-pay | Admitting: Radiology

## 2010-09-22 DIAGNOSIS — J189 Pneumonia, unspecified organism: Secondary | ICD-10-CM | POA: Insufficient documentation

## 2010-09-22 DIAGNOSIS — I509 Heart failure, unspecified: Secondary | ICD-10-CM

## 2010-09-22 LAB — CBC
HCT: 32.1 % — ABNORMAL LOW (ref 39.0–52.0)
MCH: 20.8 pg — ABNORMAL LOW (ref 26.0–34.0)
MCV: 71.7 fL — ABNORMAL LOW (ref 78.0–100.0)
RBC: 4.48 MIL/uL (ref 4.22–5.81)
WBC: 13 10*3/uL — ABNORMAL HIGH (ref 4.0–10.5)

## 2010-09-22 LAB — COMPREHENSIVE METABOLIC PANEL
ALT: 14 U/L (ref 0–53)
AST: 15 U/L (ref 0–37)
Alkaline Phosphatase: 82 U/L (ref 39–117)
CO2: 26 mEq/L (ref 19–32)
Chloride: 104 mEq/L (ref 96–112)
GFR calc non Af Amer: 52 mL/min — ABNORMAL LOW (ref >60)
Potassium: 4.3 mEq/L (ref 3.5–5.1)
Sodium: 141 mEq/L (ref 135–145)
Total Bilirubin: 0.3 mg/dL (ref 0.3–1.2)

## 2010-09-22 LAB — TROPONIN I
Troponin I: <0.3 ng/mL (ref <0.30)
Troponin I: <0.3 ng/mL (ref <0.30)

## 2010-09-22 LAB — DIFFERENTIAL
Eosinophils Relative: 7 % — ABNORMAL HIGH (ref 0–5)
Lymphocytes Relative: 8 % — ABNORMAL LOW (ref 12–46)
Lymphs Abs: 1.1 10*3/uL (ref 0.7–4.0)
Monocytes Relative: 7 % (ref 3–12)

## 2010-09-22 LAB — GLUCOSE, CAPILLARY
Glucose-Capillary: 200 mg/dL — ABNORMAL HIGH (ref 70–99)
Glucose-Capillary: 233 mg/dL — ABNORMAL HIGH (ref 70–99)
Glucose-Capillary: 109 mg/dL — ABNORMAL HIGH (ref 70–99)

## 2010-09-22 LAB — T3: T3, Total: 64.8 ng/dL — ABNORMAL LOW (ref 80.0–204.0)

## 2010-09-22 LAB — LIPID PANEL
HDL: 42 mg/dL (ref >39)
LDL Cholesterol: 79 mg/dL (ref 0–99)

## 2010-09-22 LAB — HEMOGLOBIN A1C
Hgb A1c MFr Bld: 10.3 % — ABNORMAL HIGH (ref 4.6–6.5)
Mean Plasma Glucose: 240 mg/dL — ABNORMAL HIGH (ref <117)

## 2010-09-22 LAB — CK TOTAL AND CKMB (NOT AT ARMC)
CK, MB: 3.1 ng/mL (ref 0.3–4.0)
Relative Index: INVALID (ref 0.0–2.5)

## 2010-09-22 LAB — T4: T4, Total: 7.6 ug/dL (ref 5.0–12.5)

## 2010-09-22 MED ORDER — CEFUROXIME AXETIL 500 MG PO TABS: 500.0000 mg | ORAL_TABLET | Freq: Two times a day (BID) | ORAL | Status: AC

## 2010-09-22 MED ORDER — TECHNETIUM TO 99M ALBUMIN AGGREGATED
5.5000 | Freq: Once | INTRAVENOUS | Status: AC | PRN
  Administered 2010-09-22: 6.5 via INTRAVENOUS

## 2010-09-22 MED ORDER — XENON XE 133 GAS
6.5000 | GAS_FOR_INHALATION | Freq: Once | RESPIRATORY_TRACT | Status: AC | PRN
  Administered 2010-09-22: 6.5 via RESPIRATORY_TRACT

## 2010-09-22 MED ORDER — AZITHROMYCIN 500 MG PO TABS: 500.0000 mg | ORAL_TABLET | Freq: Every day | ORAL | Status: AC

## 2010-09-22 NOTE — Assessment & Plan Note (Signed)
His cxr shows an infiltrate with effusion so I called him and I asked him to start taking ceftin and zithromax

## 2010-09-23 ENCOUNTER — Inpatient Hospital Stay (HOSPITAL_COMMUNITY): Payer: Medicare Other

## 2010-09-23 LAB — DIFFERENTIAL
Basophils Absolute: 0 10*3/uL (ref 0.0–0.1)
Basophils Relative: 0 % (ref 0–1)
Eosinophils Absolute: 0.4 10*3/uL (ref 0.0–0.7)
Lymphs Abs: 1.1 10*3/uL (ref 0.7–4.0)
Monocytes Relative: 6 % (ref 3–12)
Neutro Abs: 8.4 10*3/uL — ABNORMAL HIGH (ref 1.7–7.7)

## 2010-09-23 LAB — IRON AND TIBC: UIBC: 284 ug/dL

## 2010-09-23 LAB — GLUCOSE, CAPILLARY
Glucose-Capillary: 178 mg/dL — ABNORMAL HIGH (ref 70–99)
Glucose-Capillary: 145 mg/dL — ABNORMAL HIGH (ref 70–99)
Glucose-Capillary: 221 mg/dL — ABNORMAL HIGH (ref 70–99)
Glucose-Capillary: 225 mg/dL — ABNORMAL HIGH (ref 70–99)

## 2010-09-23 LAB — VITAMIN B12: Vitamin B-12: 772 pg/mL (ref 211–911)

## 2010-09-23 LAB — CBC
HCT: 31.5 % — ABNORMAL LOW (ref 39.0–52.0)
Hemoglobin: 9.3 g/dL — ABNORMAL LOW (ref 13.0–17.0)
MCV: 71.1 fL — ABNORMAL LOW (ref 78.0–100.0)
RDW: 19.1 % — ABNORMAL HIGH (ref 11.5–15.5)
WBC: 10.5 10*3/uL (ref 4.0–10.5)

## 2010-09-23 LAB — BASIC METABOLIC PANEL
BUN: 27 mg/dL — ABNORMAL HIGH (ref 6–23)
CO2: 26 mEq/L (ref 19–32)
Chloride: 102 mEq/L (ref 96–112)
Creatinine, Ser: 1.4 mg/dL — ABNORMAL HIGH (ref 0.50–1.35)
GFR calc Af Amer: 60 mL/min — ABNORMAL LOW (ref >60)
Glucose, Bld: 115 mg/dL — ABNORMAL HIGH (ref 70–99)

## 2010-09-23 LAB — FERRITIN: Ferritin: 71 ng/mL (ref 22–322)

## 2010-09-23 LAB — FOLATE: Folate: >20 ng/mL

## 2010-09-23 MED ORDER — IOHEXOL 300 MG/ML  SOLN
100.0000 mL | Freq: Once | INTRAMUSCULAR | Status: AC | PRN
  Administered 2010-09-23: 100 mL via INTRAVENOUS

## 2010-09-23 NOTE — H&P (Signed)
[name], GEATHERS NO.:  [id number]  MEDICAL RECORD NO.:  [id number]  LOCATION:  1403                         FACILITY:  [facility]  PHYSICIAN:  Gery Pray, MD      DATE OF BIRTH:  [birth year]  DATE OF ADMISSION:  09/21/2010 DATE OF DISCHARGE:                             HISTORY & PHYSICAL   PRIMARY CARE PHYSICIAN:  Corwin Levins, MD  CARDIOLOGIST:  [name]. Bensimhon, MD  CODE STATUS:  Full code.  CHIEF COMPLAINT:  Shortness of breath.  HISTORY OF PRESENT ILLNESS:  This is a 77 year old gentleman who is a relatively poor historian, he states that he has been feeling a little bit badly from before he left his house today.  He left and went to an adult day care center where he started getting much more short of breath.  He states that he was wheezing.  He developed chest pains with activity.  The chest pain went away with rest. his pain was centrally located described as a tightness.  He states there was no radiation.  He had a cough, which was productive of whitish phlegm. He report no nausea, no vomiting, no abdominal pain, no palpitation.  He does have lower extremity edema, greater in the left than the right.  He does have a h/o CHF. He states he is compliant with his medication.  He was finally sent to the ER.  History obtained from the patient.  ROS: all 10 point system revieqwed and negative except as noted in HPI  PAST MEDICAL HISTORY: 1. Chronic diastolic heart failure. 2. Hypertension. 3. Hypothyroidism. 4. Diabetes mellitus, type 2. 5. Chronic kidney disease, stage 3. 6. Iron deficiency anemia. 7. History of stroke with some residual left-sided weakness. 8. Chronic dysarthria. 9. Dyslipidemia. 10.History of obstructive sleep apnea. 11.History of prostate cancer. 12.Depression/anxiety. 13.Remote history of alcohol abuse. 14.Gout. 15.History of peptic ulcer disease.  PAST SURGICAL HISTORY:  Prostatectomy.  MEDICATIONS:  Allopurinol,  amiodarone, aspirin, Lipitor, Celexa, Allegra, Imdur, insulin, Synthroid, Prilosec, and spironolactone.  ALLERGIES:  No known drug allergies.  SOCIAL HISTORY:  Negative tobacco, alcohol, or illicit drugs.  The patient lives alone.  He states he should be using oxygen at night, but he does not.  He also states he should be using a CPAP at night, but he does not.  He ambulates with a cane.  FAMILY HISTORY:  Father who had CVA at age 60.  Mother who has dementia. He has a sister who has both hypertension and diabetes mellitus.  PHYSICAL EXAMINATION:  VITAL SIGNS:  Blood pressure 167/77, pulse 81, respirations 22, temperature 97.3, saturating 100% on 2 L nasal cannula. GENERAL:  Alert and oriented male. EYES:  Pink conjunctivae.  PERRLA. ENT:  Moist oral mucosa.  Trachea midline. NECK:  Supple. LUNGS:  Poor air exchange, however, no wheeze appreciated on my examination.  No use of accessory muscles. CARDIOVASCULAR:  Regular rate and rhythm without murmurs, rubs, or gallops. ABDOMEN:  Obese, soft, positive bowel sounds, nontender, nondistended. Not able to assess for organomegaly due to the patient's body habitus. NEURO:  Cranial nerves II through XII grossly intact.  Sensation intact. Strength is 5/5 in all extremities.  The patient does have some increased edema in the left lower extremity.  Mild pitting 1+. SKIN:  No rash.  No subcutaneous crepitation.   LABORATORY DATA:  White blood count 10.2, hemoglobin 8.6, platelets 522. Sodium 138, potassium 3.6, chloride 102, CO2 of 29, glucose 209, BUN 28, creatinine 1.4, calcium 10.  LFTs are normal.  Chest x-ray shows no significant change.  EKG, normal sinus rhythm.  ASSESSMENT/PLAN: 1. Shortness of breath. 2. Coronary artery disease. 3. Anemia, likely due to chronic disease related to renal     insufficiency.  The patient will be admitted.  The patient does     have chronic congestive heart failure.  We will go ahead and start      the patient on some Lasix and cycle cardiac enzymes.  We will     order D-dimer to evaluate for other possible causes of the     patient's shortness of breath, esp since his CXR is relatively normal. SOB      could also be related to his anemia.  His hemoglobin is a     little lower than his baseline (9.6).  Will go ahead and     transfuse a single unit packed red blood cell     and reevaluate.  We will continue the patient's home medications of     his aspirin, statin, and blood pressure medications. 4. Chronic kidney disease, stage 3. 5. Diabetes mellitus. 6. Hypothyroidism. 7. Hypertension. 8. Continue home medications.  Place the patient on ADA diet, on     sliding scale insulin.  Continue to monitor. 9. Obstructive sleep apnea. 10.Chronic respiratory failure. 11.We will consult Respiratory to place a CPAP at night, titrate to     the patient's comfort, and we will order oxygen to keep sats     greater than 88%. 12.History of prostate cancer. 13.Depression. 14.Gout. 15.Peptic ulcer disease, all stable. 16.The patient will be on deep venous thrombosis prophylaxis, heparin,     and gastrointestinal prophylaxis.  Nitro p.r.n. chest pain will     also be ordered.  Cardiac enzymes will be cycled.  The patient will     go to team 5 in the morning.          ______________________________ Gery Pray, MD     DC/MEDQ  D:  09/21/2010  T:  09/23/2010  Job:  [id number]  Electronically Signed by Gery Pray MD on 09/23/2010 06:26:56 AM

## 2010-09-24 ENCOUNTER — Inpatient Hospital Stay (HOSPITAL_COMMUNITY): Payer: Medicare Other

## 2010-09-24 LAB — GLUCOSE, CAPILLARY
Glucose-Capillary: 166 mg/dL — ABNORMAL HIGH (ref 70–99)
Glucose-Capillary: 167 mg/dL — ABNORMAL HIGH (ref 70–99)

## 2010-09-24 LAB — BASIC METABOLIC PANEL
CO2: 25 mEq/L (ref 19–32)
Calcium: 9.1 mg/dL (ref 8.4–10.5)
Chloride: 101 mEq/L (ref 96–112)
Creatinine, Ser: 1.42 mg/dL — ABNORMAL HIGH (ref 0.50–1.35)
Glucose, Bld: 135 mg/dL — ABNORMAL HIGH (ref 70–99)

## 2010-09-25 ENCOUNTER — Other Ambulatory Visit (HOSPITAL_COMMUNITY): Payer: Medicare Other

## 2010-09-25 DIAGNOSIS — I5021 Acute systolic (congestive) heart failure: Secondary | ICD-10-CM

## 2010-09-25 LAB — BASIC METABOLIC PANEL
Chloride: 100 mEq/L (ref 96–112)
Creatinine, Ser: 1.53 mg/dL — ABNORMAL HIGH (ref 0.50–1.35)
GFR calc Af Amer: 54 mL/min — ABNORMAL LOW (ref >60)

## 2010-09-25 LAB — GLUCOSE, CAPILLARY
Glucose-Capillary: 208 mg/dL — ABNORMAL HIGH (ref 70–99)
Glucose-Capillary: 240 mg/dL — ABNORMAL HIGH (ref 70–99)

## 2010-09-25 NOTE — Consult Note (Signed)
[name], [name] NO.:  [id number]  MEDICAL RECORD NO.:  [id number]  LOCATION:  1403                         FACILITY:  [facility]  PHYSICIAN:  Madolyn Frieze. Jens Som, MD, FACCDATE OF BIRTH:  22-Nov-1932  DATE OF CONSULTATION:  09/22/2010 DATE OF DISCHARGE:                                CONSULTATION   The patient is a 77 year old male with past medical history of diabetes, hypertension, hyperlipidemia, diastolic congestive heart failure, prior CVA, paroxysmal atrial fibrillation, hypothyroidism, renal insufficiency, iron deficiency anemia, Mobitz I, obstructive sleep apnea, and prostate cancer, who I am asked to evaluate for congestive heart failure.  The patient's last echocardiogram was performed on April 27, 2009.  At that time, he was found to have normal LV function with mild mitral regurgitation and trace aortic insufficiency.  The left atrium was moderately dilated.  The patient is followed by Dr. Gala Romney.  He was last seen in May 2012.  The patient was admitted on September 21, 2010, with complaints of increased dyspnea on exertion for the past 3 days.  There was no orthopnea, but there was PND and pedal edema. He occasionally had a brief pain in his chest for 1 to 2 seconds, but no exertional chest pain.  He has been treated for heart failure and Cardiology was asked to further evaluate.  MEDICATIONS:  At present include: 1. Allopurinol 100 mg p.o. daily. 2. Amiodarone 100 mg p.o. daily. 3. Aspirin 325 mg p.o. daily. 4. Celexa 10 mg p.o. daily. 5. Subcutaneous heparin. 6. Hydralazine 50 mg p.o. t.i.d. 7. Insulin. 8. Levothyroxine 125 mcg p.o. daily. 9. Protonix 40 mg p.o. b.i.d. 10.Crestor 10 mg p.o. daily. 11.Albuterol as needed. 12.Lasix 40 mg IV b.i.d.  ALLERGIES:  There is a listing of ACE INHIBITORS as an allergy.  PAST MEDICAL HISTORY:  Significant for diabetes, hypertension, and hyperlipidemia.  He has had hypothyroidism and a prior CVA.  He  also has a history of obstructive sleep apnea and renal insufficiency.  He has a history of paroxysmal atrial fibrillation, but is felt not to be a Coumadin candidate.  There is a history of iron-deficiency anemia and Mobitz 1.  He also has a history of prostate cancer and has had previous surgery.  There is a history of depression and anxiety, remote alcohol abuse and gout.  There is remote history of peptic ulcer disease.  FAMILY HISTORY:  Negative for coronary artery disease.  SOCIAL HISTORY:  He has remote history of alcohol use.  He has not smoked in 15 years.  REVIEW OF SYSTEMS:  He denies any headaches or fevers or chills.  He has had a productive cough of whitish sputum.  There is no dysphagia, odynophagia, melena or hematochezia.  There is no dysuria, hematuria. There is no rash or seizure activity.  There is no orthopnea, but he did describe PND and pedal edema.  Remaining systems are negative.  PHYSICAL EXAMINATION:  VITAL SIGNS:  Today shows a blood pressure of 177/74 and his pulse of 54.  Temperature is 97.6. GENERAL:  He is well-developed and obese.  He is in no acute distress at present. SKIN:  Warm and dry.  He does not appear  to be depressed.  There is no peripheral clubbing. BACK:  Normal. HEENT:  Normal with normal eyelids. NECK:  Supple with normal upstroke bilaterally.  There are no bruits noted.  There is no jugular distention.  I cannot appreciate thyromegaly.  CHEST:  Shows diminished breath sounds throughout, but predominantly in the right lower lobe. CARDIOVASCULAR:  Regular rhythm with normal S1 and S2.  There is a 2/6 systolic murmur in the left sternal border. ABDOMEN:  Exam difficult due to his obesity.  There is no tenderness to palpation.  I cannot appreciate hepatosplenomegaly or masses.  His femoral pulses are not palpated. EXTREMITIES:  Show 1+ edema.  His distal pulses are diminished. NEUROLOGIC:  Exam is significant for some  dysarthria.  LABORATORY DATA:  His laboratories show negative enzymes.  His BNP on August 2 was 639.  His sodium is 141 with potassium of 4.3.  BUN and creatinine are 26/1.33.  White blood cell count is 13 with a hemoglobin 9.3 and hematocrit 32.1.  Platelet count is 557.  Chest x-ray shows mild edema with right-sided pleural effusion.  A V/Q scan is intermediate probability.  Chest CT shows a moderate size right pleural effusion with emphysema as well.  There also indeterminate 3-mm pulmonary nodules in the right upper lobe.  There is also a left adrenal gland nodule that may be representative of adrenal adenoma.  An electrocardiogram is not available.  His telemetry does show Mobitz I.  DIAGNOSES: 1. Acute on chronic diastolic congestive heart failure - the patient     presents with symptoms of congestive heart failure, mildly elevated     BNP and evidence of volume overload on examination.  I agree with     continuing IV diuresis and following renal function closely.  He     does state that he is feeling better at this point.  Once he is     discharged, then we would recommend resuming his Demadex at     previous dose with an additional 20 mg p.o. daily p.r.n., increased     weight gain of 2 to 3 pounds or increased edema.  He will continue     on his hydralazine.  Note Coreg was written for, but I would avoid     this as he does have Mobitz I on telemetry and a history of this. 2. Mobitz I - would not add beta blockade.  No symptoms.  Continue to     monitor. 3. History of paroxysmal atrial fibrillation - continue amiodarone and     aspirin.  He is felt not to be a Coumadin candidate by Dr.     Gala Romney. 4. Renal insufficiency - we will follow this closely. 5. Right pleural effusion - I will plan to repeat his chest x-ray     after diuresis to see if this has improved.  If not, it may need     further evaluation. 6. Hypertension - we will adjust his medications as  indicated.     Madolyn Frieze Jens Som, MD, [facility]    BSC/MEDQ  D:  09/22/2010  T:  09/23/2010  Job:  [id number]  Electronically Signed by Olga Millers MD [facility] on 09/25/2010 03:56:59 PM

## 2010-09-26 ENCOUNTER — Other Ambulatory Visit: Payer: Self-pay | Admitting: Interventional Radiology

## 2010-09-26 ENCOUNTER — Inpatient Hospital Stay (HOSPITAL_COMMUNITY): Payer: Medicare Other

## 2010-09-26 LAB — TYPE AND SCREEN
Antibody Screen: NEGATIVE
Unit division: 0

## 2010-09-26 LAB — BODY FLUID CELL COUNT WITH DIFFERENTIAL
Eos, Fluid: 37 %
Other Cells, Fluid: 0 %

## 2010-09-26 LAB — PATHOLOGIST SMEAR REVIEW

## 2010-09-26 LAB — LACTATE DEHYDROGENASE, PLEURAL OR PERITONEAL FLUID: LD, Fluid: 404 U/L — ABNORMAL HIGH (ref 3–23)

## 2010-09-26 LAB — ALBUMIN, FLUID (OTHER)

## 2010-09-26 LAB — GLUCOSE, CAPILLARY
Glucose-Capillary: 108 mg/dL — ABNORMAL HIGH (ref 70–99)
Glucose-Capillary: 152 mg/dL — ABNORMAL HIGH (ref 70–99)

## 2010-09-27 LAB — BASIC METABOLIC PANEL
Calcium: 8.9 mg/dL (ref 8.4–10.5)
GFR calc non Af Amer: 48 mL/min — ABNORMAL LOW (ref >60)
Sodium: 138 mEq/L (ref 135–145)

## 2010-09-27 NOTE — Discharge Summary (Addendum)
NAMERAFAEL, SALWAY NO.:  [id number]  MEDICAL RECORD NO.:  [id number]  LOCATION:  1403                         FACILITY:  [facility]  PHYSICIAN:  Kathlen Mody, MD       DATE OF BIRTH:  [birth year]  DATE OF ADMISSION:  09/21/2010 DATE OF DISCHARGE:  09/27/2010                        DISCHARGE SUMMARY - REFERRING   PRIMARY CARE PROVIDER:  Corwin Levins, MD.  CARDIOLOGIST:  [name]. Bensimhon, MD.  DISCHARGE DIAGNOSES: 1. Acute-on-chronic diastolic heart failure. 2. Right pleural effusion secondary to acute-on-chronic diastolic     heart failure. 3. Chronic kidney disease, stage 3. 4. Hypertension, uncontrolled. 5. Atrial fibrillation. 6. Depression. 7. Hypothyroidism. 8. Dyslipidemia.  DISCHARGE MEDICATIONS: 1. Amlodipine 5 mg p.o. daily. 2. Lasix 40 mg p.o. b.i.d. 3. Hydralazine 50 mg tablet 1-1/2 tablets for 75 mg p.o. t.i.d. 4. Allegra 180 mg p.o. daily as needed. 5. Allopurinol 100 mg p.o. daily. 6. Ambien 5 mg p.o. daily at bedtime as needed for sleep. 7. Aspirin 325 mg p.o. daily. 8. Atorvastatin 40 mg half a tab p.o. daily. 9. Citalopram 10 mg p.o. daily. 10.Isosorbide mononitrate XR 60 mg p.o. daily. 11.Levemir 100 units subcu daily. 12.Levothyroxine 125 mcg p.o. daily. 13.Omeprazole 20 mg p.o. daily. 14.Torsemide 20 mg p.o. b.i.d.  MEDICATIONS STOPPED AT THIS HOSPITALIZATION: 1. Amiodarone 200 mg half a tab p.o. daily. 2. Hydralazine 50 mg p.o. t.i.d. 3. Spironolactone 25 mg p.o. daily. 4. Lasix 20 mg p.o. b.i.d.  DIAGNOSTIC LABORATORIES:  Sodium 138, potassium 4.3, chloride 101, CO2 of 29, BUN 28, creatinine 1.40, glucose 201.  WBCs 11.2, hemoglobin 8.6, hematocrit 28.8, platelets 522.  First set of cardiac enzymes yields a total CK of 100, CK-MB 3.2, troponin-I less than 0.30.  D-dimer 3.71. Second set of cardiac enzymes yields a total CK of 94, CK-MB 3.0, troponin-I less than 0.30.  Lipid profile yields a cholesterol  148, triglycerides 137, HDL 42, LDL 79.  Hemoglobin A1c is 10.3.  TSH is 2.33.  BNP was 639.  Third set of cardiac enzymes yields a total CK of 96, CK-MB of 3.1, troponin-I of less than 0.30.  Anemia panel yields an iron of 17, TIBC of 301, percent saturation of 6.  Vitamin B12 was 772, folate greater than 20.0, ferritin is 71.  The patient had a thoracentesis on August 8th fluid yielded an albumin level of 2.2 and LDH level of 404.  Cell count; WBCs 2091, segmented neutrophils 4, lymphocytes 45, monocyte - macrophage 14, eosinophils 37.  Culture of the thoracentesis fluid shows no organisms to date.  DIAGNOSTIC IMAGING: 1. Chest x-ray on August 2nd yields a new right pleural effusion with     right lower lobe infiltrate or atelectasis.  1. CT of the chest done on August 3rd yields moderate size right-sided     pleural effusion with adjacent atelectasis.  No focal airspace     opacities.  Moderate centrilobular and paraseptal emphysema.     Indeterminate 3 mm pulmonary nodule within the right upper lobe. 2. VQ scan done on August 3rd yields intermediate probability for     pulmonary embolism based on large triple matching abnormalities in     right lower  lobe and to a lesser degree portion on the right middle     lobe. 3. Chest x-ray done on August 3rd yields mild edema with right-sided     pleural effusion and associated basilar opacity. 4. Chest x-ray done on August 4th shows mild CHF, moderate right     effusion with right lower lobe atelectasis. 5. CT angio of the chest done on August 4th yields moderate quality     evaluation for pulmonary embolism.  No embolism in the large     segmental level.  Moderate right-sided pleural effusion with     suspicion of mild loculation anteriorly and laterally.  Consider     thoracentesis.  Right hilar and infrahilar adenopathy.  Could be     reactive underlying neoplasm cannot be excluded.  Consider follow     up with contrast-enhanced chest  CT after the patient is improved     from the acute clinical setting and the pleural effusion has     resolved.  Pulmonary artery enlargement would suggest pulmonary     arterial hypertension.  Cardiomegaly and coronary artery     atherosclerosis. 6. Chest x-ray done August 5th yields stable vascular congestion,     right effusion, and right basilar pulmonary opacities. 7. Chest x-ray done on August 7th yields no pneumothorax after right-     sided thoracentesis. 8. Ultrasound-guided thoracentesis done on August 7th successful     ultrasound-guided right thoracentesis yielding 110 mL of pleural     fluid. 9. A 2-D echo done on August 6th yields an ejection fraction of 55% to     60%, systolic function normal, wall thickness was increased in the     pattern of moderate to severe LVH.  No regional wall motion     abnormalities.  Features consistent with pseudo normal left     ventricular filling pattern, with concomitant abnormal relaxation,     and increased filling pressure, grade 2 diastolic dysfunction.     Mitral valve with moderate regurg.  Left atrium was moderately to     severely dilated.  Right atrium was mildly dilated.  CONSULTATIONS:  Madolyn Frieze. Jens Som, MD, [facility] on August 4th.  PROCEDURES:  Ultrasound-guided thoracentesis on August 6th.  BRIEF HISTORY:  Mr. Lacivita is a 77 year old male with a history of diabetes, hypertension, hyperlipidemia, diastolic congestive heart failure, prior CVA, paroxysmal atrial fibrillation, hypothyroidism, renal insufficiency, iron deficiency anemia, Mobitz I, obstructive sleep apnea, and prostate cancer who presented to the [facility] ED on August 4th with a chief complaint of shortness of breath.  He reports that he goes to an adult day care center every day and he started wheezing on this particular day and became much more short of breath.  He also developed chest pain with activity and the pain went away with rest.  He indicates his  pain was centrally located and described it as a tightness without radiation.  He also reports a cough, which produced whitish sputum.  He denied nausea, vomiting, abdominal pain, palpitation.  He presented with some lower extremity edema, greater on the left than the right.  He indicates that he was compliant with his medication.  The hospitalists were asked to admit for further evaluation and treatment.  HOSPITAL COURSE BY PROBLEM: 1. Acute-on-chronic diastolic heart failure.  The patient was admitted     to telemetry unit.  He was given IV Lasix, hydralazine, Imdur.  He     diuresed with good results.  With each passing day, his shortness     of breath improved.  He was able to ambulate.  Cardiology was     consulted on August 4th.  Their recommendations included to     continue the IV diuresis and switch it to p.o. on August 8.  They     recommended at discharge resuming the Demadex 20 mg b.i.d. with an     additional 20 mg should the patient have a weight gain of 2-3     pounds or increased edema.  They also recommended avoiding the     Coreg, given the patient has Mobitz I on Telemetry as well as a     history.  They also recommended avoiding beta blockers for the same     reason.  At the time of discharge, the patient is at his baseline.     He is able to maintain his saturation level of 94% to 97% on room     air. 2. Right pleural effusion likely secondary to acute-on-chronic     diastolic heart failure.  The patient had several images as stated     above.  On August 6th, he underwent an ultrasound-guided     thoracentesis with removal of 110 mL of fluid.  The patient     indicated afterwards of sensation of relief with regards to his     pain and his respirations. 3. Chronic kidney disease, stage 3.  The patient was admitted with a     creatinine of 1.33.  During his hospitalization, his creatinine     ranged from 1.3-1.5 which is his baseline according to chart     review.   On the day of discharge, his creatinine was 1.4. 4. Hypertension.  The patient's Imdur was increased as stated on med     list.  Norvasc 5 mg was also started for better control.     Cardiology recommended avoiding beta blockers and calcium channel     blockers secondary to a heart block. 5. A fib remained rate controlled during this hospitalization.  His     amiodarone was on hold and is remaining as such secondary to his     heart rate.  He will follow up with Dr. Gala Romney on August 29th,     at which time his medications can be reevaluated. 6. Depression.  The patient remained at baseline during this     hospitalization and his home medication was continued. 7. Dyslipidemia.  The patient was continued on Crestor.  PHYSICAL EXAMINATION:  VITAL SIGNS: Temperature 98.2, blood pressure 169/78, heart rate 78, respiration 18, sats 97% on room air. GENERAL: Up in chair, alert and oriented. CV: Regular rate and rhythm.  Positive murmur, trace lower extremity edema. RESPIRATORY:  Normal effort.  Breath sounds clear to auscultation bilaterally.  No rhonchi, wheezes, or rales. ABDOMEN:  Obese, soft, positive bowel sounds throughout, nontender to palpation. NEURO:  Alert and oriented x3.  Speech clear.  Facial symmetry.  DIET:  Heart healthy, low sodium.  ACTIVITY:  As tolerated.  FOLLOWUP:  The patient has an appointment with Dr. Gala Romney on August 29th at 11 a.m.  DISPOSITION:  The patient is medically stable and ready for discharge.  Time spent on this discharge 45 minutes.     Gwenyth Bender, NP   ______________________________ Kathlen Mody, MD    KMB/MEDQ  D:  09/27/2010  T:  09/27/2010  Job:  [id number]  cc:   [name]. Bensimhon,  MD [address], [place] [address]  Corwin Levins, MD [address] [place] [address]  Electronically Signed by Kathlen Mody MD on 09/27/2010 11:51:15 PM Electronically Signed by Toya Smothers  on 10/12/2010 07:50:57 AM

## 2010-09-29 LAB — BODY FLUID CULTURE: Gram Stain: NONE SEEN

## 2010-10-02 ENCOUNTER — Other Ambulatory Visit: Payer: Self-pay | Admitting: Internal Medicine

## 2010-10-02 ENCOUNTER — Ambulatory Visit: Payer: Medicare Other | Admitting: Physician Assistant

## 2010-10-12 ENCOUNTER — Ambulatory Visit: Payer: Medicare Other | Admitting: Endocrinology

## 2010-10-12 DIAGNOSIS — Z029 Encounter for administrative examinations, unspecified: Secondary | ICD-10-CM

## 2010-10-16 ENCOUNTER — Telehealth: Payer: Self-pay

## 2010-10-16 MED ORDER — ALPRAZOLAM 0.25 MG PO TABS: 0.2500 mg | ORAL_TABLET | Freq: Two times a day (BID) | ORAL | Status: DC | PRN

## 2010-10-16 NOTE — Telephone Encounter (Signed)
Pt's daughter called requesting Rx for anxiety. No medication on med list, please advise.

## 2010-10-16 NOTE — Telephone Encounter (Signed)
Done hardcopy to dahlia/LIM B  

## 2010-10-17 NOTE — Telephone Encounter (Signed)
Pt's daughter informed of Rx/pharmacy 

## 2010-10-18 ENCOUNTER — Inpatient Hospital Stay (HOSPITAL_COMMUNITY)
Admission: EM | Admit: 2010-10-18 | Discharge: 2010-10-24 | DRG: 292 | Disposition: A | Discharge status: Home Health | Payer: Medicare Other | Source: Ambulatory Visit | Attending: Cardiology | Admitting: Cardiology

## 2010-10-18 ENCOUNTER — Inpatient Hospital Stay: Admission: AD | Admit: 2010-10-18 | Payer: Self-pay | Source: Ambulatory Visit | Admitting: Internal Medicine

## 2010-10-18 ENCOUNTER — Encounter: Payer: Self-pay | Admitting: Physician Assistant

## 2010-10-18 ENCOUNTER — Ambulatory Visit (INDEPENDENT_AMBULATORY_CARE_PROVIDER_SITE_OTHER): Payer: Medicare Other | Admitting: Physician Assistant

## 2010-10-18 VITALS — BP 166/64 | HR 65 | Ht 70.5 in | Wt 229.8 lb

## 2010-10-18 DIAGNOSIS — F411 Generalized anxiety disorder: Secondary | ICD-10-CM

## 2010-10-18 DIAGNOSIS — E0590 Thyrotoxicosis, unspecified without thyrotoxic crisis or storm: Secondary | ICD-10-CM | POA: Diagnosis present

## 2010-10-18 DIAGNOSIS — I129 Hypertensive chronic kidney disease with stage 1 through stage 4 chronic kidney disease, or unspecified chronic kidney disease: Secondary | ICD-10-CM | POA: Diagnosis present

## 2010-10-18 DIAGNOSIS — G4733 Obstructive sleep apnea (adult) (pediatric): Secondary | ICD-10-CM | POA: Diagnosis present

## 2010-10-18 DIAGNOSIS — I441 Atrioventricular block, second degree: Secondary | ICD-10-CM | POA: Diagnosis present

## 2010-10-18 DIAGNOSIS — I2510 Atherosclerotic heart disease of native coronary artery without angina pectoris: Secondary | ICD-10-CM

## 2010-10-18 DIAGNOSIS — Z888 Allergy status to other drugs, medicaments and biological substances status: Secondary | ICD-10-CM

## 2010-10-18 DIAGNOSIS — N179 Acute kidney failure, unspecified: Secondary | ICD-10-CM | POA: Diagnosis present

## 2010-10-18 DIAGNOSIS — J984 Other disorders of lung: Secondary | ICD-10-CM | POA: Diagnosis present

## 2010-10-18 DIAGNOSIS — R609 Edema, unspecified: Secondary | ICD-10-CM | POA: Insufficient documentation

## 2010-10-18 DIAGNOSIS — I5033 Acute on chronic diastolic (congestive) heart failure: Secondary | ICD-10-CM

## 2010-10-18 DIAGNOSIS — E785 Hyperlipidemia, unspecified: Secondary | ICD-10-CM | POA: Diagnosis present

## 2010-10-18 DIAGNOSIS — M109 Gout, unspecified: Secondary | ICD-10-CM | POA: Diagnosis present

## 2010-10-18 DIAGNOSIS — I10 Essential (primary) hypertension: Secondary | ICD-10-CM

## 2010-10-18 DIAGNOSIS — I4891 Unspecified atrial fibrillation: Secondary | ICD-10-CM

## 2010-10-18 DIAGNOSIS — R269 Unspecified abnormalities of gait and mobility: Secondary | ICD-10-CM | POA: Diagnosis present

## 2010-10-18 DIAGNOSIS — D509 Iron deficiency anemia, unspecified: Secondary | ICD-10-CM | POA: Diagnosis present

## 2010-10-18 DIAGNOSIS — I4892 Unspecified atrial flutter: Secondary | ICD-10-CM | POA: Diagnosis present

## 2010-10-18 DIAGNOSIS — I509 Heart failure, unspecified: Secondary | ICD-10-CM | POA: Diagnosis present

## 2010-10-18 DIAGNOSIS — R590 Localized enlarged lymph nodes: Secondary | ICD-10-CM | POA: Insufficient documentation

## 2010-10-18 DIAGNOSIS — I69998 Other sequelae following unspecified cerebrovascular disease: Secondary | ICD-10-CM

## 2010-10-18 DIAGNOSIS — Z8546 Personal history of malignant neoplasm of prostate: Secondary | ICD-10-CM

## 2010-10-18 DIAGNOSIS — I69922 Dysarthria following unspecified cerebrovascular disease: Secondary | ICD-10-CM

## 2010-10-18 DIAGNOSIS — R5381 Other malaise: Secondary | ICD-10-CM | POA: Diagnosis present

## 2010-10-18 DIAGNOSIS — N183 Chronic kidney disease, stage 3 unspecified: Secondary | ICD-10-CM | POA: Diagnosis present

## 2010-10-18 LAB — DIFFERENTIAL
Basophils Absolute: 0 10*3/uL (ref 0.0–0.1)
Eosinophils Absolute: 0.4 10*3/uL (ref 0.0–0.7)
Lymphocytes Relative: 12 % (ref 12–46)
Lymphs Abs: 1.1 10*3/uL (ref 0.7–4.0)
Monocytes Relative: 11 % (ref 3–12)
Neutro Abs: 7 10*3/uL (ref 1.7–7.7)

## 2010-10-18 LAB — COMPREHENSIVE METABOLIC PANEL
Alkaline Phosphatase: 79 U/L (ref 39–117)
BUN: 39 mg/dL — ABNORMAL HIGH (ref 6–23)
Chloride: 105 mEq/L (ref 96–112)
GFR calc Af Amer: 50 mL/min — ABNORMAL LOW (ref >60)
GFR calc non Af Amer: 42 mL/min — ABNORMAL LOW (ref >60)
Glucose, Bld: 213 mg/dL — ABNORMAL HIGH (ref 70–99)
Potassium: 3.4 mEq/L — ABNORMAL LOW (ref 3.5–5.1)
Total Bilirubin: 0.3 mg/dL (ref 0.3–1.2)

## 2010-10-18 LAB — TYPE AND SCREEN

## 2010-10-18 LAB — GLUCOSE, CAPILLARY: Glucose-Capillary: 136 mg/dL — ABNORMAL HIGH (ref 70–99)

## 2010-10-18 LAB — CBC
Hemoglobin: 7.9 g/dL — ABNORMAL LOW (ref 13.0–17.0)
MCH: 20.7 pg — ABNORMAL LOW (ref 26.0–34.0)
MCHC: 30.2 g/dL (ref 30.0–36.0)
Platelets: 393 10*3/uL (ref 150–400)
RBC: 3.82 MIL/uL — ABNORMAL LOW (ref 4.22–5.81)

## 2010-10-18 LAB — ABO/RH: ABO/RH(D): O POS

## 2010-10-18 LAB — CARDIAC PANEL(CRET KIN+CKTOT+MB+TROPI)
CK, MB: 3.6 ng/mL (ref 0.3–4.0)
Total CK: 87 U/L (ref 7–232)

## 2010-10-18 LAB — CK TOTAL AND CKMB (NOT AT ARMC): Total CK: 101 U/L (ref 7–232)

## 2010-10-18 LAB — PROTIME-INR
INR: 1.12 (ref 0.00–1.49)
Prothrombin Time: 14.6 seconds (ref 11.6–15.2)

## 2010-10-18 NOTE — Assessment & Plan Note (Signed)
He continued amiodarone himself upon discharge.  He has been able to tolerate this over the years.This will be continued for now.

## 2010-10-18 NOTE — Assessment & Plan Note (Signed)
He is volume overloaded.  His symptoms of panic attacks are likely paroxysms of dyspnea.  I discussed his case today with Dr. Riley Kill and we agree he should be readmitted to the hospital.  The patient was actually asking to be admitted himself.  He will be admitted for IV diuresis.  We will check a chest x-ray and a BNP along with his lab work.  We will also ask for the heart failure service to see him as this is his second admission in one month.

## 2010-10-18 NOTE — Assessment & Plan Note (Signed)
No apparent angina.  Continue aspirin.  Check serial enzymes.

## 2010-10-18 NOTE — Assessment & Plan Note (Signed)
Maintaining sinus rhythm on amiodarone.  He is a poor Coumadin candidate.  LFTs and TSH were normal several weeks ago.  He has been maintained on low dose amiodarone for many years.

## 2010-10-18 NOTE — Assessment & Plan Note (Signed)
Follow renal function and potassium closely with IV diuresis.

## 2010-10-18 NOTE — Assessment & Plan Note (Signed)
We will ask psychiatry to see him while he is hospitalized to review his medications and help him with his anxiety.

## 2010-10-18 NOTE — Assessment & Plan Note (Signed)
Uncontrolled.  Start Amlodipine 5 mg daily.  This was started during his last admxn.  The patient is not taking this medication.

## 2010-10-18 NOTE — Assessment & Plan Note (Signed)
Left leg is larger than the right.  I will check venous Dopplers.

## 2010-10-18 NOTE — Assessment & Plan Note (Signed)
He will need a followup chest CT arranged at some point in the near future.

## 2010-10-18 NOTE — Progress Notes (Addendum)
History of Present Illness: Primary Cardiologist:  Dr. Arvilla Meres  [name] is a 77 y.o. male who presents for post hospital follow up.   He has a history of diastolic heart failure as well as 2nd degree heart block (Wenckebach), hypertension,  hyperlipidemia, diabetes, previous CVA, paroxysmal atrial fibrillation on amiodarone. Not on coumadin as felt to be high risk of bleeding due to h/o  and falls.  He was admitted 8/2-8/8 with increased shortness of breath and chest pain.  He was diagnosed with acute on chronic diastolic heart failure.  He was seen by cardiology in consultation.  He was diuresed with IV Lasix.  Beta blockers were avoided secondary to his history of second degree heart block type I.  He did have a right-sided pleural effusion that required thoracentesis.  It was recommended that his amiodarone be held secondary to bradycardia.    Echocardiogram 8/6: Moderate to severe LVH, EF 55-60%, grade 2 diastolic dysfunction, moderate MR, moderate-severe LAE, mild RAE.   Chest CT angiogram 8/4: No pulmonary embolism and the large arteries, moderate right effusion, right hilar and infrahilar lymphadenopathy, consider contrasted CT after effusion resolved to rule out malignancy, pulmonary arteries and large consistent with pulmonary arterial hypertension, cardiomegaly, coronary atherosclerosis.   Labs hemoglobin 9.3, MCV 71.1, d-dimer 3.71, potassium 3.7, BUN 31, creatinine 1.53, ALT 14, albumin 2.9, A1c 10.3, cardiac markers negative x3, BNP 2588 upon admission improving to 607.4 at discharge, TC 148, TG 137, HDL 42, LDL 79, TSH 2.33, iron 17.  He is feeling more short of breath.  He is describing NYHA class III symptoms.  This is worsened since discharge from the hospital.  He is sleeping in a recliner.  This is fairly chronic.  He is describing episodes of PND.  His lower extremity edema is stable.  He is he is not certain if it has worsened since discharge from the hospital.  He  does not monitor his weights.  He notes a cough productive of white sputum.  He denies chest pain or syncope.  He feels as though he is having panic attacks.  His girlfriend of 30+ years died in late 24-Sep-2022.  He has episodes where he becomes "nervous.".  He has associated shortness of breath with this.  These are similar symptoms to what brought him to the hospital 8/2.  His weight is up 2 pounds since discharge by our scales.  Of note, he contacted his PCP for his nervousness spells.  He was given citalopram.  He was already on this at 10 mg a day.  He took another dose yesterday for a total of 20 mg.  Past Medical History  Diagnosis Date  . Chronic diastolic heart failure     secondary to diastolic dysfunction EF (previous EF 35-45%)ef 60%4/09  . Arrhythmia     atrial fibrillation /pt on amiodarone,thought to be poor coumadin  . Stroke   . Other and unspecified hyperlipidemia   . Hypertension   . Hypertrophy of prostate without urinary obstruction and other lower urinary tract symptoms (LUTS)   . Osteoarthrosis, unspecified whether generalized or localized, unspecified site   . Type II or unspecified type diabetes mellitus without mention of complication, not stated as uncontrolled   . AV block, Mobitz 1     Intolerance to ACE's / discontiuation of beta blockers  . Obstructive sleep apnea   . Iron deficiency anemia, unspecified     s/p EGD and coloscopy 3/10.gastritis.hemorrhids  . Cerebrovascular accident   .  Renal insufficiency   . Obesity   . Allergic rhinitis, cause unspecified 05/29/2010  . Prostate cancer   . CAD (coronary artery disease)     cath 12/04: mLAD 50-60%, mCFX 20-30%, pRCA 50-60%, mRCA 40%    Current Outpatient Prescriptions  Medication Sig Dispense Refill  . allopurinol (ZYLOPRIM) 100 MG tablet Take 1 tablet (100 mg total) by mouth daily.  30 tablet  6  . amiodarone (PACERONE) 200 MG tablet 200 mg. 1/2 tab qd       . aspirin 325 MG tablet Take 325 mg by mouth daily.         Marland Kitchen atorvastatin (LIPITOR) 40 MG tablet Take 40 mg by mouth daily. Take 1/2 tablet once daily      . citalopram (CELEXA) 10 MG tablet Take 1 tablet (10 mg total) by mouth daily.  30 tablet  6  . glucose blood test strip Use as instructed  100 each  5  . hydrALAZINE (APRESOLINE) 50 MG tablet Take 50 mg by mouth 3 (three) times daily.        . Insulin Pen Needle 31G X 8 MM MISC 1 each by Does not apply route daily.  100 each  1  . isosorbide mononitrate (IMDUR) 60 MG 24 hr tablet TAKE 1 TABLET BY MOUTH EVERY DAY  30 tablet  6  . levothyroxine (LEVOTHROID) 125 MCG tablet Take 1 tablet (125 mcg total) by mouth daily.  30 tablet  9  . omeprazole (PRILOSEC) 20 MG capsule TAKE 1 CAPSULE BY MOUTH EVERY DAY  30 capsule  10  . potassium chloride 20 MEQ/15ML (10%) SOLN Take 10 mEq by mouth daily.        Marland Kitchen spironolactone (ALDACTONE) 25 MG tablet Take 1 tablet (25 mg total) by mouth daily.  30 tablet  6  . torsemide (DEMADEX) 20 MG tablet Take 1 tablet (20 mg total) by mouth 2 (two) times daily.  60 tablet  6  . zolpidem (AMBIEN) 5 MG tablet Take 5 mg by mouth at bedtime as needed.        . insulin detemir (LEVEMIR FLEXPEN) 100 UNIT/ML injection          Allergies: Allergies  Allergen Reactions  . Ace Inhibitors     REACTION: extreme  hypotension  . Beta Adrenergic Blockers     REACTION: use cautiously secondary to 2nd degree heart block, hypotension   Social history:  Ex-smoker  ROS:  Please see the history of present illness.  All other systems reviewed and negative.   Vital Signs: BP 166/64  Pulse 65  Ht 5' 10.5" (1.791 m)  Wt 229 lb 12.8 oz (104.237 kg)  BMI 32.51 kg/m2  PHYSICAL EXAM: Well nourished, well developed, in no acute distress HEENT: normal Neck: + JVD Endocrine: no thyromegaly Cardiac:  normal S1, S2; RRR; no murmur; no S3 Lungs:  Bibasilar rales, no wheezing Abd: soft, nontender, no hepatomegaly Ext: trace edema on the right and 2+ edema on the left Skin: warm and  dry Neuro:  CNs 2-12 intact, no focal abnormalities noted Psych:  Flat affect  EKG:  Sinus rhythm, heart rate 65, normal axis, nonspecific ST-T wave changes, second degree AV block type I  ASSESSMENT AND PLAN:

## 2010-10-18 NOTE — Assessment & Plan Note (Signed)
Continue current diabetic regimen.  Cover with sliding scale insulin

## 2010-10-19 ENCOUNTER — Inpatient Hospital Stay (HOSPITAL_COMMUNITY): Payer: Medicare Other

## 2010-10-19 DIAGNOSIS — M7989 Other specified soft tissue disorders: Secondary | ICD-10-CM

## 2010-10-19 DIAGNOSIS — M79609 Pain in unspecified limb: Secondary | ICD-10-CM

## 2010-10-19 DIAGNOSIS — I5033 Acute on chronic diastolic (congestive) heart failure: Secondary | ICD-10-CM

## 2010-10-19 LAB — GLUCOSE, CAPILLARY
Glucose-Capillary: 258 mg/dL — ABNORMAL HIGH (ref 70–99)
Glucose-Capillary: 125 mg/dL — ABNORMAL HIGH (ref 70–99)

## 2010-10-19 LAB — BASIC METABOLIC PANEL
BUN: 37 mg/dL — ABNORMAL HIGH (ref 6–23)
CO2: 27 mEq/L (ref 19–32)
Calcium: 9.1 mg/dL (ref 8.4–10.5)
Creatinine, Ser: 1.48 mg/dL — ABNORMAL HIGH (ref 0.50–1.35)
Glucose, Bld: 117 mg/dL — ABNORMAL HIGH (ref 70–99)
Sodium: 141 mEq/L (ref 135–145)

## 2010-10-19 LAB — CARDIAC PANEL(CRET KIN+CKTOT+MB+TROPI): CK, MB: 3.4 ng/mL (ref 0.3–4.0)

## 2010-10-20 ENCOUNTER — Encounter: Payer: Self-pay | Admitting: *Deleted

## 2010-10-20 LAB — BASIC METABOLIC PANEL
CO2: 26 mEq/L (ref 19–32)
Calcium: 9.2 mg/dL (ref 8.4–10.5)
Creatinine, Ser: 1.36 mg/dL — ABNORMAL HIGH (ref 0.50–1.35)
Glucose, Bld: 131 mg/dL — ABNORMAL HIGH (ref 70–99)

## 2010-10-20 LAB — CBC
MCH: 20.4 pg — ABNORMAL LOW (ref 26.0–34.0)
MCV: 69.5 fL — ABNORMAL LOW (ref 78.0–100.0)
Platelets: 393 10*3/uL (ref 150–400)
RBC: 4.16 MIL/uL — ABNORMAL LOW (ref 4.22–5.81)

## 2010-10-20 LAB — GLUCOSE, CAPILLARY
Glucose-Capillary: 158 mg/dL — ABNORMAL HIGH (ref 70–99)
Glucose-Capillary: 181 mg/dL — ABNORMAL HIGH (ref 70–99)

## 2010-10-21 DIAGNOSIS — I5031 Acute diastolic (congestive) heart failure: Secondary | ICD-10-CM

## 2010-10-21 LAB — GLUCOSE, CAPILLARY
Glucose-Capillary: 108 mg/dL — ABNORMAL HIGH (ref 70–99)
Glucose-Capillary: 162 mg/dL — ABNORMAL HIGH (ref 70–99)

## 2010-10-21 LAB — BASIC METABOLIC PANEL
Chloride: 104 mEq/L (ref 96–112)
GFR calc Af Amer: 55 mL/min — ABNORMAL LOW (ref >60)
Potassium: 4.6 mEq/L (ref 3.5–5.1)
Sodium: 138 mEq/L (ref 135–145)

## 2010-10-22 LAB — GLUCOSE, CAPILLARY: Glucose-Capillary: 205 mg/dL — ABNORMAL HIGH (ref 70–99)

## 2010-10-22 LAB — BASIC METABOLIC PANEL
BUN: 36 mg/dL — ABNORMAL HIGH (ref 6–23)
Chloride: 103 mEq/L (ref 96–112)
Glucose, Bld: 129 mg/dL — ABNORMAL HIGH (ref 70–99)
Potassium: 5.3 mEq/L — ABNORMAL HIGH (ref 3.5–5.1)

## 2010-10-23 LAB — BASIC METABOLIC PANEL
BUN: 46 mg/dL — ABNORMAL HIGH (ref 6–23)
GFR calc Af Amer: 52 mL/min — ABNORMAL LOW (ref >60)
GFR calc non Af Amer: 43 mL/min — ABNORMAL LOW (ref >60)
Potassium: 4.6 mEq/L (ref 3.5–5.1)
Sodium: 138 mEq/L (ref 135–145)

## 2010-10-23 LAB — GLUCOSE, CAPILLARY
Glucose-Capillary: 86 mg/dL (ref 70–99)
Glucose-Capillary: 111 mg/dL — ABNORMAL HIGH (ref 70–99)
Glucose-Capillary: 142 mg/dL — ABNORMAL HIGH (ref 70–99)

## 2010-10-24 ENCOUNTER — Telehealth: Payer: Self-pay | Admitting: Internal Medicine

## 2010-10-24 LAB — BASIC METABOLIC PANEL
BUN: 46 mg/dL — ABNORMAL HIGH (ref 6–23)
GFR calc non Af Amer: 43 mL/min — ABNORMAL LOW (ref >60)
Glucose, Bld: 181 mg/dL — ABNORMAL HIGH (ref 70–99)
Potassium: 4.5 mEq/L (ref 3.5–5.1)

## 2010-10-24 NOTE — Telephone Encounter (Signed)
Pt not sure why liquid potassium was on discharge sheet she states he was on pill previously and was not given a prescription for liquid so she is going to give him 1 tab daily, will check level at appt on 9/11

## 2010-10-24 NOTE — Telephone Encounter (Signed)
Pt was recently d/c from hospital and it was recommended that pt take liquid potassium, however pt has never taken the liquid potassium. Pt daughter calling wanting to know if pt can take the pill form of potassium. Please return pt daughter call to advise/discuss.

## 2010-10-24 NOTE — Telephone Encounter (Signed)
Left message to call back  

## 2010-10-25 ENCOUNTER — Telehealth: Payer: Self-pay | Admitting: Internal Medicine

## 2010-10-25 NOTE — Telephone Encounter (Signed)
[facility]- RN  [facility]. Pt refuse service today from [facility].

## 2010-10-26 NOTE — Telephone Encounter (Signed)
Will follow up with patient in HF clinic

## 2010-10-30 ENCOUNTER — Other Ambulatory Visit: Payer: Self-pay | Admitting: Internal Medicine

## 2010-10-30 DIAGNOSIS — I5032 Chronic diastolic (congestive) heart failure: Secondary | ICD-10-CM

## 2010-10-30 NOTE — Telephone Encounter (Signed)
appt 9/11

## 2010-10-31 ENCOUNTER — Ambulatory Visit (HOSPITAL_COMMUNITY)
Admission: RE | Admit: 2010-10-31 | Discharge: 2010-10-31 | Disposition: A | Discharge status: Home / Self Care | Payer: Medicare Other | Source: Ambulatory Visit | Attending: Internal Medicine | Admitting: Internal Medicine

## 2010-10-31 VITALS — BP 150/66 | HR 75 | Wt 217.5 lb

## 2010-10-31 DIAGNOSIS — I509 Heart failure, unspecified: Secondary | ICD-10-CM

## 2010-10-31 DIAGNOSIS — I5032 Chronic diastolic (congestive) heart failure: Secondary | ICD-10-CM

## 2010-10-31 MED ORDER — FE FUM-VIT C-VIT B12-FA 460-60-0.01-1 MG PO CAPS: 1.0000 | ORAL_CAPSULE | Freq: Every day | ORAL | Status: DC

## 2010-10-31 NOTE — Progress Notes (Signed)
HPI:  [name] is a very pleasant 77 year old male with  a history of diastolic heart failure as well as 2nd degree heart block (Wenckebach), hypertension,  hyperlipidemia, diabetes, previous CVA, paroxysmal atrial fibrillation on amiodarone. Not on coumadin as felt to be high risk of bleeding due to h/o ETOH. Echo 3/11 EF 60-65% mild MR.   Has had several hospitalizations for volume overload with most recent discharge  Sept 4th.  He diuresed with IV lasix and was discharged on torsemide 20 mg BID.  Discharge weight of 102 kg.  He has had difficult to control hypertension and his medications were titrated during hospital stay as well as a clonidine patch was started.    He returns today for post hospital follow up.  He says he is doing great.  He denies dypsnea, orthopnea, PND or CP.  No syncope.  He has been weighing himself daily and staying at 220 lbs.  He had not needed extra demadex.  He is working with HHPT without difficulty.  He is compliant with his medications and low salt diet.  His daughter is taking care of his pill box and has been monitoring his grocery shopping.  He is not using his home O2 at this time.      ROS: All systems negative except as listed in HPI, PMH and Problem List.  Past Medical History  Diagnosis Date  . Chronic diastolic heart failure     secondary to diastolic dysfunction EF (previous EF 35-45%)ef 60%4/09  . Arrhythmia     atrial fibrillation /pt on amiodarone,thought to be poor coumadin  . Stroke   . Other and unspecified hyperlipidemia   . Hypertension   . Hypertrophy of prostate without urinary obstruction and other lower urinary tract symptoms (LUTS)   . Osteoarthrosis, unspecified whether generalized or localized, unspecified site   . Type II or unspecified type diabetes mellitus without mention of complication, not stated as uncontrolled   . AV block, Mobitz 1     Intolerance to ACE's / discontiuation of beta blockers  . Obstructive sleep apnea     . Iron deficiency anemia, unspecified     s/p EGD and coloscopy 3/10.gastritis.hemorrhids  . Cerebrovascular accident   . Renal insufficiency   . Obesity   . Allergic rhinitis, cause unspecified 05/29/2010  . Prostate cancer   . CAD (coronary artery disease)     cath 12/04: mLAD 50-60%, mCFX 20-30%, pRCA 50-60%, mRCA 40%    Current Outpatient Prescriptions  Medication Sig Dispense Refill  . allopurinol (ZYLOPRIM) 100 MG tablet Take 1 tablet (100 mg total) by mouth daily.  30 tablet  6  . amiodarone (PACERONE) 200 MG tablet 200 mg. 1/2 tab qd       . amLODipine (NORVASC) 10 MG tablet Take 10 mg by mouth daily.        Marland Kitchen aspirin 325 MG tablet Take 325 mg by mouth daily.        Marland Kitchen atorvastatin (LIPITOR) 40 MG tablet Take 40 mg by mouth daily.       . citalopram (CELEXA) 10 MG tablet Take 20 mg by mouth daily.        . cloNIDine (CATAPRES - DOSED IN MG/24 HR) 0.1 mg/24hr patch Place 1 patch onto the skin once a week.        . ferrous sulfate 325 (65 FE) MG tablet Take 325 mg by mouth 2 (two) times daily with a meal.        . fexofenadine (  ALLEGRA) 180 MG tablet Take 180 mg by mouth daily as needed.        Marland Kitchen glucose blood test strip Use as instructed  100 each  5  . hydrALAZINE (APRESOLINE) 50 MG tablet Take 100 mg by mouth 3 (three) times daily.       . insulin detemir (LEVEMIR) 100 UNIT/ML injection Inject 100 Units into the skin daily.        . Insulin Pen Needle 31G X 8 MM MISC 1 each by Does not apply route daily.  100 each  1  . isosorbide mononitrate (IMDUR) 60 MG 24 hr tablet TAKE 1 TABLET BY MOUTH EVERY DAY  30 tablet  6  . levothyroxine (LEVOTHROID) 125 MCG tablet Take 1 tablet (125 mcg total) by mouth daily.  30 tablet  9  . omeprazole (PRILOSEC) 20 MG capsule TAKE 1 CAPSULE BY MOUTH EVERY DAY  30 capsule  10  . potassium chloride SA (K-DUR,KLOR-CON) 20 MEQ tablet Take 20 mEq by mouth daily.        Marland Kitchen spironolactone (ALDACTONE) 25 MG tablet Take 1 tablet (25 mg total) by mouth daily.   30 tablet  6  . torsemide (DEMADEX) 20 MG tablet Take 1 tablet (20 mg total) by mouth 2 (two) times daily.  60 tablet  6  . zolpidem (AMBIEN) 5 MG tablet Take 5 mg by mouth at bedtime as needed.           PHYSICAL EXAM: Filed Vitals:   10/31/10 1202  BP: 150/66  Pulse: 75   General:  Elderly male. no resp difficulty. +dysarthria. walks with cane HEENT: normal except for facial droop Neck: supple. JVP 5  Carotids 2+ bilat; no bruits.  Cor: PMI nondisplaced. RRR.  Soft S4.  Lungs: clear Abdomen: obese .soft, nontender. No hepatomegaly. No bruits or masses. Good bowel sounds. Extremities: no cyanosis, clubbing, rash.No edema Neuro: alert & orientedx3, cranial nerves grossly intact except for facial droop. moves all 4 extremities w/o difficulty. affect pleasant     ASSESSMENT & PLAN:

## 2010-10-31 NOTE — Assessment & Plan Note (Addendum)
Volume status looks good.  NYHA II.  He has been encouraged to continue daily weights and low sodium diet.  He will continue with current medical therapy.  We will check a BMET today to follow renal function.  Sliding scale demadex use have been discussed and he voices understanding.  Follow up in 3 weeks.   Patient seen and examined with [name] PA-C. We discussed all aspects of the encounter. I agree with the assessment and plan as stated above.

## 2010-10-31 NOTE — Patient Instructions (Signed)
Lab today  Your physician recommends that you schedule a follow-up appointment in: 3 weeks

## 2010-10-31 NOTE — Progress Notes (Signed)
Encounter addended by: Noralee Space, RN on: 10/31/2010  1:58 PM<BR>     Documentation filed: Orders

## 2010-11-01 NOTE — Telephone Encounter (Signed)
Status of lab work to be done at  [facility] office . Would like to be down tomorrw. [phone].

## 2010-11-01 NOTE — Telephone Encounter (Signed)
Order placed for labs tomorrow

## 2010-11-02 ENCOUNTER — Other Ambulatory Visit (INDEPENDENT_AMBULATORY_CARE_PROVIDER_SITE_OTHER): Payer: Medicare Other

## 2010-11-02 ENCOUNTER — Telehealth (HOSPITAL_COMMUNITY): Payer: Self-pay | Admitting: *Deleted

## 2010-11-02 DIAGNOSIS — I5022 Chronic systolic (congestive) heart failure: Secondary | ICD-10-CM

## 2010-11-02 DIAGNOSIS — I5032 Chronic diastolic (congestive) heart failure: Secondary | ICD-10-CM

## 2010-11-02 DIAGNOSIS — I509 Heart failure, unspecified: Secondary | ICD-10-CM

## 2010-11-02 LAB — BASIC METABOLIC PANEL
CO2: 22 mEq/L (ref 19–32)
Calcium: 8.9 mg/dL (ref 8.4–10.5)
Glucose, Bld: 174 mg/dL — ABNORMAL HIGH (ref 70–99)
Sodium: 135 mEq/L (ref 135–145)

## 2010-11-02 NOTE — Telephone Encounter (Signed)
Pt needs bmet on 9/20 order placed

## 2010-11-07 ENCOUNTER — Ambulatory Visit: Payer: Medicare Other | Admitting: Physician Assistant

## 2010-11-08 ENCOUNTER — Other Ambulatory Visit (INDEPENDENT_AMBULATORY_CARE_PROVIDER_SITE_OTHER): Payer: Medicare Other

## 2010-11-08 DIAGNOSIS — I5022 Chronic systolic (congestive) heart failure: Secondary | ICD-10-CM

## 2010-11-08 LAB — BASIC METABOLIC PANEL
BUN: 49 mg/dL — ABNORMAL HIGH (ref 6–23)
CO2: 24 mEq/L (ref 19–32)
Chloride: 104 mEq/L (ref 96–112)
Creatinine, Ser: 2.2 mg/dL — ABNORMAL HIGH (ref 0.4–1.5)
Glucose, Bld: 195 mg/dL — ABNORMAL HIGH (ref 70–99)

## 2010-11-08 NOTE — Discharge Summary (Signed)
[name], Kim NO.:  [id number]  MEDICAL RECORD NO.:  [id number]  LOCATION:  3711                         FACILITY:  [facility]  PHYSICIAN:  [name]. Sonya Pucci, MDDATE OF BIRTH:  January 01, 1933  DATE OF ADMISSION:  10/18/2010 DATE OF DISCHARGE:  10/24/2010                              DISCHARGE SUMMARY   PRIMARY CARDIOLOGIST:  [name]. Travis Corning, MD  PRIMARY CARE PROVIDER:  Corwin Levins, MD  DISCHARGE DIAGNOSES: 1. Acute on chronic diastolic heart failure.     a.     Discharge weight 102 kg. 2. Acute on chronic respiratory insufficiency. 3. Chronic kidney disease stage III. 4. Fall risk/gait instability. 5. History of cerebrovascular accident with residual dysarthria and     weakness. 6. Hypertension, uncontrolled. 7. Mobitz I heart block. 8. Paroxysmal atrial fibrillation/atypical atrial flutter.     a.     Not felt to be a Coumadin candidate secondary to high risk      for falls. 9. Iron deficiency anemia. 10.Anxiety disorder. 11.Probable obstructive sleep apnea on home oxygen nightly.  SECONDARY DIAGNOSES: 1. Hyperthyroidism. 2. Dyslipidemia. 3. History of prostate cancer. 4. History of alcohol abuse. 5. Gout.  ALLERGIES/INTOLERANCES: 1. ACE INHIBITORS causing hypotension. 2. BETA-BLOCKERS secondary to heart block.  PROCEDURES/DIAGNOSTICS PERFORMED DURING HOSPITALIZATION:  Chest x-ray on October 02, 2010, showing interval decrease in small right pleural effusion and right basilar atelectasis.  Stable mild cardiomegaly and interstitial edema pattern.  REASON FOR HOSPITALIZATION:  This is a 77 year old gentleman with the above-stated problem list, who was recently discharged from [facility] on September [facility], 2010, after treatment of acute on chronic diastolic heart failure as well as a right pleural effusion requiring thoracentesis on September 26, 2010, so was being evaluated by Tereso Newcomer, PA for a post-hospitalization recheck on October 18, 2010, where he was complaining of more shortness of breath, NYH class III symptoms.  This had worsened since hospital discharge.  The patient was sleeping in his recliner as well as using oxygen 24 hours a day instead of nightly.  His lower extremity edema was stable and did not feel that this was increasing.  Over the last 4 days, the patient had had increased shortness of breath, his weight is up to 2 pounds by outpatient office scales.  As well, the patient was volume overloaded on exam and, therefore, was admitted for IV diuresis.  HOSPITAL COURSE:  The patient was admitted to the step-down unit where initial pro-BNP was 2454 which was increased from August 5 , 2012, of 667.  The patient improved greatly with IV diuresis, he states his dyspnea was much improved.  The patient has been noting bouts of anxiety and he is recently being placed on citalopram by his primary care provider.  He states that his anxiety had also improved during hospitalization.  The patient's renal function was watched closely whilein the hospital with initial creatinine/BUN of 1.62/39 with discharge creatinine of 1.58/46.  The patient was continued on IV diuresis until October 21, 2010, where he was changed back to his oral torsemide.  He was ambulating in the halls at this time without much difficulty on O2. Without O2, the patient's sats  did decrease to 86% on room air. Therefore, it has been noted that the patient should be placed on oxygen during ambulation as well as at night.  This has been set up.  In speaking with the patient, it appears that his discharge medications from September 26, 2010, were not continued at home.  There was a confusion on his amlodipine in general as well as his hydralazine dose.  The patient was hypertensive during this day with systolic blood pressures running 160s-170s.  His hydralazine has been titrated.  His Norvasc has also been added back to his medical regimen as well as a  clonidine patch.  The changed medications have been discussed with his daughter who does prepare his pill boxes for the week and voices understanding of the change.  At one point in time, he also had an anemia study on September 23, 2010, which showed iron deficiency anemia.  Therefore, iron has been started this hospitalization and will be continued at discharge.  The patient's hemoglobin and hematocrit remained stable at 8.5/28.9.  During hospitalization, the patient was also evaluated by PT/OT.  As noted, the patient is definitely at high risk for falls.  It was recommended that the patient go to a skilled nursing facility post discharge, but he is extremely adamant about going home.  This has been discussed with his daughter who does check up on the patient several times a week as well as helps prepare his food.  We will have a Home Health nurse, PT and OT as well as a Child psychotherapist evaluate the patient's home needs.  The patient is agreeable to this at this time.  We appreciate their help in discharge planning.  We will also have a life watch system set up for the patient at home as well as remote telemonitoring for the patient's weight and blood pressures.  On day of discharge, Dr. Gala Romney evaluated the patient and noted him stable for home.  His volume status was at baseline.  He was still hypertensive, but again, the clonidine patch was initiated today.  He was ambulating with oxygen without difficulty.  He will have close followup in heart failure clinic.  Discharge meds and instructions have been discussed with the patient and his daughter and they voiced understanding.  Discharge pro-BNP noted 698.  DISCHARGE MEDICATIONS: 1. Amlodipine 10 mg 1 tablet daily. 2. Clonidine patch 0.1 mg/24 hours 1 patch transdermally q.7 days. 3. Ferrous sulfate 325 mg 1 tablet twice daily. 4. Citalopram 10 mg 2 tablets daily. 5. Hydralazine 50 mg 2 tablets 3 times a day. 6. Allegra 180 mg 1  tablet daily as needed for allergies. 7. Allopurinol 100 mg 1 tablet daily. 8. Ambien 5 mg 1 tablet daily at bedtime as needed. 9. Amiodarone 200 mg half a tablet daily. 10.Aspirin 325 mg 1 tablet daily. 11.Atorvastatin 40 one tablet daily. 12.Isosorbide mononitrate XR 60 mg 1 tablet daily. 13.Levemir 100 units/mL injection 100 units subcutaneously daily. 14.Levothyroxine 125 mcg daily. 15.Omeprazole 20 mg daily. 16.Potassium chloride oral liquid 20 mEq/50 mL 2.5 mL daily. 17.Spironolactone 25 mg daily. 18.Torsade 20 mg 1 tablet twice daily.  FOLLOWUP PLANS AND INSTRUCTIONS: 1. The patient will follow up in the Heart Failure Clinic with Dr.     Gala Romney on Tuesday, October 31, 2010, at 11:45 a.m. 2. The patient will follow up with Dr. Jonny Ruiz as previously scheduled or     as needed. 3. The patient is to increase activity as tolerated with help of  PT/OT. 4. The patient is to call the office in the interim for any problems     or concerns. 5. The patient is to weigh himself daily with increase of 3-4 pounds     overnight, to call the office for further instructions.  DISCHARGE LABORATORY DATA:  Sodium 138, potassium 4.5, BUN 46, creatinine 1.58.  Pro-BNP at 698.  DURATION OF DISCHARGE:  Greater than 30 minutes with physician and physician assistant time.     Leonette Monarch, PA-C   ______________________________ [name]. Alania Overholt, MD    NB/MEDQ  D:  10/24/2010  T:  10/24/2010  Job:  [id number]  cc:   [name]. Renald Haithcock, MD Travis Levins, MD  Electronically Signed by [name] P.A. on 11/03/2010 12:17:49 PM Electronically Signed by Arvilla Meres MD on 11/08/2010 10:39:30 PM

## 2010-11-13 ENCOUNTER — Other Ambulatory Visit: Payer: Self-pay | Admitting: *Deleted

## 2010-11-13 ENCOUNTER — Other Ambulatory Visit: Payer: Self-pay | Admitting: Internal Medicine

## 2010-11-13 MED ORDER — ASPIRIN 325 MG PO TABS: 325.0000 mg | ORAL_TABLET | Freq: Every day | ORAL | Status: DC

## 2010-11-13 MED ORDER — CLONIDINE HCL 0.1 MG/24HR TD PTWK: 1.0000 | MEDICATED_PATCH | TRANSDERMAL | Status: DC

## 2010-11-13 MED ORDER — HYDRALAZINE HCL 50 MG PO TABS: 100.0000 mg | ORAL_TABLET | Freq: Three times a day (TID) | ORAL | Status: DC

## 2010-11-13 MED ORDER — CITALOPRAM HYDROBROMIDE 10 MG PO TABS: 20.0000 mg | ORAL_TABLET | Freq: Every day | ORAL | Status: DC

## 2010-11-13 MED ORDER — AMLODIPINE BESYLATE 10 MG PO TABS: 10.0000 mg | ORAL_TABLET | Freq: Every day | ORAL | Status: DC

## 2010-11-13 NOTE — Telephone Encounter (Signed)
[facility] [address].

## 2010-11-13 NOTE — Telephone Encounter (Signed)
[facility] called requesting refill for pt's Citalopram with corrected dosage. Rx sent to pharmacy.

## 2010-11-13 NOTE — Telephone Encounter (Signed)
celexa 10 mg

## 2010-11-14 ENCOUNTER — Other Ambulatory Visit (HOSPITAL_COMMUNITY): Payer: Self-pay | Admitting: *Deleted

## 2010-11-14 ENCOUNTER — Telehealth (HOSPITAL_COMMUNITY): Payer: Self-pay | Admitting: *Deleted

## 2010-11-14 DIAGNOSIS — I10 Essential (primary) hypertension: Secondary | ICD-10-CM

## 2010-11-14 DIAGNOSIS — I5022 Chronic systolic (congestive) heart failure: Secondary | ICD-10-CM

## 2010-11-14 DIAGNOSIS — I5032 Chronic diastolic (congestive) heart failure: Secondary | ICD-10-CM

## 2010-11-14 LAB — BASIC METABOLIC PANEL
BUN: 64 — ABNORMAL HIGH
CO2: 27
Calcium: 9
Calcium: 9
Calcium: 9.1
Chloride: 100
Creatinine, Ser: 2.28 — ABNORMAL HIGH
GFR calc Af Amer: 30 — ABNORMAL LOW
GFR calc Af Amer: 30 — ABNORMAL LOW
GFR calc Af Amer: 34 — ABNORMAL LOW
GFR calc non Af Amer: 24 — ABNORMAL LOW
Glucose, Bld: 108 — ABNORMAL HIGH
Potassium: 3.6
Potassium: 3.8
Sodium: 137
Sodium: 140
Sodium: 141

## 2010-11-14 LAB — LIPID PANEL
Cholesterol: 126
HDL: 30 — ABNORMAL LOW
Total CHOL/HDL Ratio: 4.2

## 2010-11-14 LAB — COMPREHENSIVE METABOLIC PANEL
ALT: 13
ALT: 15
AST: 13
AST: 13
Alkaline Phosphatase: 49
CO2: 27
CO2: 28
Calcium: 9.3
Chloride: 106
Creatinine, Ser: 2.06 — ABNORMAL HIGH
GFR calc Af Amer: 36 — ABNORMAL LOW
GFR calc non Af Amer: 30 — ABNORMAL LOW
GFR calc non Af Amer: 32 — ABNORMAL LOW
Glucose, Bld: 100 — ABNORMAL HIGH
Glucose, Bld: 64 — ABNORMAL LOW
Potassium: 3.3 — ABNORMAL LOW
Sodium: 144
Total Bilirubin: 0.2 — ABNORMAL LOW
Total Protein: 6.8

## 2010-11-14 LAB — CBC
HCT: 29.5 — ABNORMAL LOW
Hemoglobin: 9.4 — ABNORMAL LOW
MCHC: 31.9
MCV: 84.8
RBC: 3.48 — ABNORMAL LOW
WBC: 6.6

## 2010-11-14 LAB — URINALYSIS, ROUTINE W REFLEX MICROSCOPIC
Bilirubin Urine: NEGATIVE
Ketones, ur: NEGATIVE
Nitrite: NEGATIVE
Protein, ur: NEGATIVE
Urobilinogen, UA: 0.2

## 2010-11-14 LAB — TSH: TSH: 4.696

## 2010-11-14 LAB — B-NATRIURETIC PEPTIDE (CONVERTED LAB): Pro B Natriuretic peptide (BNP): 146 — ABNORMAL HIGH

## 2010-11-14 MED ORDER — ATORVASTATIN CALCIUM 40 MG PO TABS: 40.0000 mg | ORAL_TABLET | Freq: Every day | ORAL | Status: DC

## 2010-11-14 MED ORDER — TORSEMIDE 20 MG PO TABS: 20.0000 mg | ORAL_TABLET | Freq: Two times a day (BID) | ORAL | Status: DC

## 2010-11-14 MED ORDER — AMIODARONE HCL 200 MG PO TABS: 100.0000 mg | ORAL_TABLET | Freq: Every day | ORAL | Status: DC

## 2010-11-14 MED ORDER — CLONIDINE HCL 0.1 MG/24HR TD PTWK: 1.0000 | MEDICATED_PATCH | TRANSDERMAL | Status: DC

## 2010-11-14 MED ORDER — HYDRALAZINE HCL 50 MG PO TABS: 100.0000 mg | ORAL_TABLET | Freq: Three times a day (TID) | ORAL | Status: DC

## 2010-11-14 MED ORDER — AMLODIPINE BESYLATE 10 MG PO TABS: 10.0000 mg | ORAL_TABLET | Freq: Every day | ORAL | Status: DC

## 2010-11-14 MED ORDER — ISOSORBIDE MONONITRATE ER 60 MG PO TB24: 60.0000 mg | ORAL_TABLET | Freq: Every day | ORAL | Status: DC

## 2010-11-14 NOTE — Telephone Encounter (Signed)
[name] is aware will have bmet 10/8 at [facility] office

## 2010-11-14 NOTE — Telephone Encounter (Signed)
Message copied by Noralee Space on Tue Nov 14, 2010 11:32 AM ------      Message from: Arvilla Meres R      Created: Mon Nov 13, 2010 12:52 AM       D/c spiro. Repeat 1 week.

## 2010-11-16 NOTE — Telephone Encounter (Signed)
CELEXA FILLED   VIA EPIC NO 60 X 3 REFILLS  11/13/10./CY

## 2010-11-22 LAB — GLUCOSE, CAPILLARY
Glucose-Capillary: 140 — ABNORMAL HIGH
Glucose-Capillary: 141 — ABNORMAL HIGH
Glucose-Capillary: 146 — ABNORMAL HIGH
Glucose-Capillary: 168 — ABNORMAL HIGH
Glucose-Capillary: 188 — ABNORMAL HIGH
Glucose-Capillary: 191 — ABNORMAL HIGH
Glucose-Capillary: 194 — ABNORMAL HIGH
Glucose-Capillary: 195 — ABNORMAL HIGH
Glucose-Capillary: 212 — ABNORMAL HIGH
Glucose-Capillary: 51 — ABNORMAL LOW

## 2010-11-22 LAB — PROTIME-INR
INR: 1
INR: 1
Prothrombin Time: 13.2

## 2010-11-22 LAB — CBC
HCT: 33.8 — ABNORMAL LOW
HCT: 34.6 — ABNORMAL LOW
Hemoglobin: 11.1 — ABNORMAL LOW
MCV: 82.8
MCV: 84.1
Platelets: 239
Platelets: 266
RBC: 4.11 — ABNORMAL LOW
RDW: 18.1 — ABNORMAL HIGH
WBC: 7.6

## 2010-11-22 LAB — BASIC METABOLIC PANEL
BUN: 20
BUN: 21
BUN: 23
BUN: 25 — ABNORMAL HIGH
CO2: 26
CO2: 27
CO2: 29
Calcium: 8.6
Chloride: 103
Chloride: 107
Chloride: 107
Chloride: 108
Creatinine, Ser: 1.44
Creatinine, Ser: 1.54 — ABNORMAL HIGH
Creatinine, Ser: 1.63 — ABNORMAL HIGH
Creatinine, Ser: 1.65 — ABNORMAL HIGH
GFR calc Af Amer: 50 — ABNORMAL LOW
GFR calc non Af Amer: 42 — ABNORMAL LOW
Glucose, Bld: 126 — ABNORMAL HIGH
Glucose, Bld: 165 — ABNORMAL HIGH
Potassium: 3.2 — ABNORMAL LOW
Potassium: 3.2 — ABNORMAL LOW
Potassium: 3.6
Potassium: 3.8
Sodium: 141

## 2010-11-22 LAB — DIFFERENTIAL
Basophils Absolute: 0
Lymphocytes Relative: 19
Monocytes Absolute: 0.8
Neutro Abs: 5.2

## 2010-11-22 LAB — COMPREHENSIVE METABOLIC PANEL
Albumin: 3.4 — ABNORMAL LOW
Alkaline Phosphatase: 78
BUN: 22
BUN: 24 — ABNORMAL HIGH
CO2: 27
Chloride: 108
Creatinine, Ser: 1.63 — ABNORMAL HIGH
Creatinine, Ser: 1.82 — ABNORMAL HIGH
GFR calc non Af Amer: 42 — ABNORMAL LOW
Glucose, Bld: 144 — ABNORMAL HIGH
Total Bilirubin: 0.4
Total Bilirubin: 0.5
Total Protein: 6.8

## 2010-11-22 LAB — B-NATRIURETIC PEPTIDE (CONVERTED LAB)
Pro B Natriuretic peptide (BNP): 109 — ABNORMAL HIGH
Pro B Natriuretic peptide (BNP): 82
Pro B Natriuretic peptide (BNP): 96

## 2010-11-22 LAB — MAGNESIUM: Magnesium: 2.7 — ABNORMAL HIGH

## 2010-11-22 LAB — APTT
aPTT: 34
aPTT: 36

## 2010-11-22 LAB — TSH: TSH: 3.674

## 2010-11-22 LAB — LIPID PANEL: Triglycerides: 128

## 2010-11-27 ENCOUNTER — Encounter: Payer: Self-pay | Admitting: Internal Medicine

## 2010-11-27 ENCOUNTER — Other Ambulatory Visit (INDEPENDENT_AMBULATORY_CARE_PROVIDER_SITE_OTHER): Payer: Medicare Other

## 2010-11-27 ENCOUNTER — Ambulatory Visit (INDEPENDENT_AMBULATORY_CARE_PROVIDER_SITE_OTHER): Payer: Medicare Other | Admitting: Internal Medicine

## 2010-11-27 VITALS — BP 130/62 | HR 74 | Temp 97.5°F | Ht 70.0 in | Wt 230.2 lb

## 2010-11-27 DIAGNOSIS — H9190 Unspecified hearing loss, unspecified ear: Secondary | ICD-10-CM

## 2010-11-27 DIAGNOSIS — I509 Heart failure, unspecified: Secondary | ICD-10-CM

## 2010-11-27 DIAGNOSIS — I5032 Chronic diastolic (congestive) heart failure: Secondary | ICD-10-CM

## 2010-11-27 DIAGNOSIS — Z0000 Encounter for general adult medical examination without abnormal findings: Secondary | ICD-10-CM

## 2010-11-27 DIAGNOSIS — IMO0001 Reserved for inherently not codable concepts without codable children: Secondary | ICD-10-CM

## 2010-11-27 DIAGNOSIS — H9193 Unspecified hearing loss, bilateral: Secondary | ICD-10-CM | POA: Insufficient documentation

## 2010-11-27 LAB — URINALYSIS, ROUTINE W REFLEX MICROSCOPIC
Hgb urine dipstick: NEGATIVE
Nitrite: NEGATIVE
Specific Gravity, Urine: 1.015 (ref 1.000–1.030)
Total Protein, Urine: 100
Urobilinogen, UA: 0.2 (ref 0.0–1.0)

## 2010-11-27 LAB — BASIC METABOLIC PANEL
BUN: 43 mg/dL — ABNORMAL HIGH (ref 6–23)
CO2: 30 mEq/L (ref 19–32)
Chloride: 105 mEq/L (ref 96–112)
Creatinine, Ser: 1.9 mg/dL — ABNORMAL HIGH (ref 0.4–1.5)

## 2010-11-27 LAB — MICROALBUMIN / CREATININE URINE RATIO: Creatinine,U: 56.2 mg/dL

## 2010-11-27 NOTE — Assessment & Plan Note (Signed)
With evidence for mild persistent volume overload, symptomatically doing well, Continue all other medications as before, f/u card as planned

## 2010-11-27 NOTE — Patient Instructions (Addendum)
Your ears were irrigated of wax today Continue all other medications as before Please go to LAB in the Basement for the blood and/or urine tests to be done today Please call the phone number [phone] (the PhoneTree System) for results of testing in 2-3 days;  When calling, simply dial the number, and when prompted enter the MRN number above (the Medical Record Number) and the # key, then the message should start. Please keep your appointments with your specialists as you have planned - cardiology, oct 11

## 2010-11-27 NOTE — Assessment & Plan Note (Signed)
Overall doing well, age appropriate education and counseling updated, referrals for preventative services and immunizations addressed, dietary and smoking counseling addressed, most recent labs and ECG reviewed.  I have personally reviewed and have noted: 1) the patient's medical and social history 2) The pt's use of alcohol, tobacco, and illicit drugs 3) The patient's current medications and supplements 4) Functional ability including ADL's, fall risk, home safety risk, hearing and visual impairment 5) Diet and physical activities 6) Evidence for depression or mood disorder 7) The patient's height, weight, and BMI have been recorded in the chart I have made referrals, and provided counseling and education based on review of the above Had flu shot while hospd recently

## 2010-11-27 NOTE — Progress Notes (Signed)
Subjective:    Patient ID: [name], male    DOB: 1932/07/05, 77 y.o.   MRN: [id number]  HPI  Here for wellness and f/u;  Overall doing ok;  Pt denies CP, worsening SOB, DOE, wheezing, orthopnea, PND, worsening LE edema, palpitations, dizziness or syncope.  Pt denies neurological change such as new Headache, facial or extremity weakness.  Pt denies polydipsia, polyuria, or low sugar symptoms. Pt states overall good compliance with treatment and medications, good tolerability, and trying to follow lower cholesterol diet.  Pt denies worsening depressive symptoms, suicidal ideation or panic. No fever, wt loss, night sweats, loss of appetite, or other constitutional symptoms.  Pt states fair ability with ADL's, low fall risk, home safety reviewed and adequate, no significant changes in hearing or vision, and occasionally active with exercise.  Was recently hospd for acute on chronic diast CHF;  Pt states dypnea at baseline levels. Walks with cane, no falls since d/c, due for BMET post diuresis. Does c/o bilat ear decreased hearing with wax impactions as he has had previously Past Medical History  Diagnosis Date  . Chronic diastolic heart failure     secondary to diastolic dysfunction EF (previous EF 35-45%)ef 60%4/09  . Arrhythmia     atrial fibrillation /pt on amiodarone,thought to be poor coumadin  . Stroke   . Other and unspecified hyperlipidemia   . Hypertension   . Hypertrophy of prostate without urinary obstruction and other lower urinary tract symptoms (LUTS)   . Osteoarthrosis, unspecified whether generalized or localized, unspecified site   . Type II or unspecified type diabetes mellitus without mention of complication, not stated as uncontrolled   . AV block, Mobitz 1     Intolerance to ACE's / discontiuation of beta blockers  . Obstructive sleep apnea   . Iron deficiency anemia, unspecified     s/p EGD and coloscopy 3/10.gastritis.hemorrhids  . Cerebrovascular accident   . Renal  insufficiency   . Obesity   . Allergic rhinitis, cause unspecified 05/29/2010  . Prostate cancer   . CAD (coronary artery disease)     cath 12/04: mLAD 50-60%, mCFX 20-30%, pRCA 50-60%, mRCA 40%   Past Surgical History  Procedure Date  . Prostate surgery   . Prostate surgury     hx of    reports that he has never smoked. He does not have any smokeless tobacco history on file. He reports that he does not drink alcohol. His drug history not on file. family history includes Heart disease in his father. Allergies  Allergen Reactions  . Ace Inhibitors     REACTION: extreme  hypotension  . Beta Adrenergic Blockers     REACTION: use cautiously secondary to 2nd degree heart block, hypotension   Current Outpatient Prescriptions on File Prior to Visit  Medication Sig Dispense Refill  . allopurinol (ZYLOPRIM) 100 MG tablet Take 1 tablet (100 mg total) by mouth daily.  30 tablet  6  . amiodarone (PACERONE) 200 MG tablet Take 0.5 tablets (100 mg total) by mouth daily.  15 tablet  6  . amLODipine (NORVASC) 10 MG tablet Take 1 tablet (10 mg total) by mouth daily.  30 tablet  6  . aspirin 325 MG tablet Take 1 tablet (325 mg total) by mouth daily.  30 tablet  6  . atorvastatin (LIPITOR) 40 MG tablet Take 1 tablet (40 mg total) by mouth daily.  30 tablet  6  . citalopram (CELEXA) 10 MG tablet Take 2 tablets (20 mg  total) by mouth daily.  60 tablet  3  . cloNIDine (CATAPRES - DOSED IN MG/24 HR) 0.1 mg/24hr patch Place 1 patch (0.1 mg total) onto the skin once a week.  4 patch  6  . Fe Fum-Vit C-Vit B12-FA (TRIGELS-F) 460-60-0.01-1 MG CAPS Take 1 capsule by mouth daily.  30 capsule  6  . fexofenadine (ALLEGRA) 180 MG tablet Take 180 mg by mouth daily as needed.        Marland Kitchen glucose blood test strip Use as instructed  100 each  5  . hydrALAZINE (APRESOLINE) 50 MG tablet Take 2 tablets (100 mg total) by mouth 3 (three) times daily.  180 tablet  6  . insulin detemir (LEVEMIR) 100 UNIT/ML injection Inject 100  Units into the skin daily.        . Insulin Pen Needle 31G X 8 MM MISC 1 each by Does not apply route daily.  100 each  1  . isosorbide mononitrate (IMDUR) 60 MG 24 hr tablet Take 1 tablet (60 mg total) by mouth daily.  30 tablet  6  . levothyroxine (LEVOTHROID) 125 MCG tablet Take 1 tablet (125 mcg total) by mouth daily.  30 tablet  9  . omeprazole (PRILOSEC) 20 MG capsule TAKE 1 CAPSULE BY MOUTH EVERY DAY  30 capsule  10  . torsemide (DEMADEX) 20 MG tablet Take 1 tablet (20 mg total) by mouth 2 (two) times daily.  60 tablet  6  . zolpidem (AMBIEN) 5 MG tablet Take 5 mg by mouth at bedtime as needed.         Review of Systems Review of Systems  Constitutional: Negative for diaphoresis, activity change, appetite change and unexpected weight change.  HENT: Negative for hearing loss, ear pain, facial swelling, mouth sores and neck stiffness.   Eyes: Negative for pain, redness and visual disturbance.  Respiratory: Negative for shortness of breath and wheezing.   Cardiovascular: Negative for chest pain and palpitations.  Gastrointestinal: Negative for diarrhea, blood in stool, abdominal distention and rectal pain.  Genitourinary: Negative for hematuria, flank pain and decreased urine volume.  Musculoskeletal: Negative for myalgias and joint swelling.  Skin: Negative for color change and wound.  Neurological: Negative for syncope and numbness.  Hematological: Negative for adenopathy.  Psychiatric/Behavioral: Negative for hallucinations, self-injury, decreased concentration and agitation.      Objective:   Physical Exam BP 130/62  Pulse 74  Temp(Src) 97.5 F (36.4 C) (Oral)  Ht 5\' 10"  (1.778 m)  Wt 230 lb 4 oz (104.441 kg)  BMI 33.04 kg/m2  SpO2 97% Physical Exam  VS noted Constitutional: Pt is oriented to person, place, and time. Appears well-developed and well-nourished.  HENT:  Head: Normocephalic and atraumatic.  Right Ear: External ear normal.  Left Ear: External ear normal.    Nose: Nose normal.  Bilat tm's normal after wax impactions removed Mouth/Throat: Oropharynx is clear and moist.  Eyes: Conjunctivae and EOM are normal. Pupils are equal, round, and reactive to light.  Neck: Normal range of motion. Neck supple. No JVD present. No tracheal deviation present.  Cardiovascular: Normal rate, regular rhythm, normal heart sounds and intact distal pulses.   Pulmonary/Chest: Effort normal and breath sounds with few bibas rales, no wheezing  Abdominal: Soft. Bowel sounds are normal. There is no tenderness.  Musculoskeletal: Normal range of motion. Exhibits trace edema bilat.  Lymphadenopathy:  Has no cervical adenopathy.  Neurological: Pt is alert and oriented to person, place, and time. Pt has normal reflexes.  No cranial nerve deficit.  Skin: Skin is warm and dry. No rash noted.   Assessment & Plan:

## 2010-11-27 NOTE — Assessment & Plan Note (Signed)
Improved s/p irrigation 

## 2010-11-28 ENCOUNTER — Other Ambulatory Visit: Payer: Self-pay

## 2010-11-28 MED ORDER — GLUCOSE BLOOD VI STRP: ORAL_STRIP | Status: DC

## 2010-11-29 ENCOUNTER — Other Ambulatory Visit: Payer: Self-pay

## 2010-11-29 MED ORDER — GLUCOSE BLOOD VI STRP: ORAL_STRIP | Status: AC

## 2010-11-30 ENCOUNTER — Ambulatory Visit (HOSPITAL_COMMUNITY)
Admission: RE | Admit: 2010-11-30 | Discharge: 2010-11-30 | Disposition: A | Discharge status: Home / Self Care | Payer: Medicare Other | Source: Ambulatory Visit | Attending: Internal Medicine | Admitting: Internal Medicine

## 2010-11-30 DIAGNOSIS — I5032 Chronic diastolic (congestive) heart failure: Secondary | ICD-10-CM

## 2010-11-30 DIAGNOSIS — I509 Heart failure, unspecified: Secondary | ICD-10-CM

## 2010-11-30 NOTE — Progress Notes (Signed)
HPI:  [name] is a very pleasant 77 year old male with  a history of diastolic heart failure as well as 2nd degree heart block (Wenckebach), hypertension,  hyperlipidemia, diabetes, previous CVA, paroxysmal atrial fibrillation on amiodarone. Not on coumadin as felt to be high risk of bleeding due to h/o ETOH. Echo 3/11 EF 60-65% mild MR.   Has had several hospitalizations for volume overload with most recent discharge  Sept 4th.  He diuresed with IV lasix and was discharged on torsemide 20 mg BID.  Discharge weight of 102 kg.  He has had difficult to control hypertension and his medications were titrated during hospital stay as well as a clonidine patch was started.    He returns today for follow up   He says he is doing great.  He denies dypsnea, orthopnea, PND or CP.  No syncope.  He has been weighing himself daily and staying at 228-230 lbs.  He had not needed extra demadex.  He is compliant with his medications and low salt diet.  His daughter is taking care of his pill box and has been monitoring his grocery shopping.  He is not using his home O2 at this time. Sleeps on one pillow at night. Live at home alone.       ROS: All systems negative except as listed in HPI, PMH and Problem List.  Past Medical History  Diagnosis Date  . Chronic diastolic heart failure     secondary to diastolic dysfunction EF (previous EF 35-45%)ef 60%4/09  . Arrhythmia     atrial fibrillation /pt on amiodarone,thought to be poor coumadin  . Stroke   . Other and unspecified hyperlipidemia   . Hypertension   . Hypertrophy of prostate without urinary obstruction and other lower urinary tract symptoms (LUTS)   . Osteoarthrosis, unspecified whether generalized or localized, unspecified site   . Type II or unspecified type diabetes mellitus without mention of complication, not stated as uncontrolled   . AV block, Mobitz 1     Intolerance to ACE's / discontiuation of beta blockers  . Obstructive sleep apnea     . Iron deficiency anemia, unspecified     s/p EGD and coloscopy 3/10.gastritis.hemorrhids  . Cerebrovascular accident   . Renal insufficiency   . Obesity   . Allergic rhinitis, cause unspecified 05/29/2010  . Prostate cancer   . CAD (coronary artery disease)     cath 12/04: mLAD 50-60%, mCFX 20-30%, pRCA 50-60%, mRCA 40%    Current Outpatient Prescriptions  Medication Sig Dispense Refill  . allopurinol (ZYLOPRIM) 100 MG tablet Take 1 tablet (100 mg total) by mouth daily.  30 tablet  6  . amiodarone (PACERONE) 200 MG tablet Take 0.5 tablets (100 mg total) by mouth daily.  15 tablet  6  . amLODipine (NORVASC) 10 MG tablet Take 1 tablet (10 mg total) by mouth daily.  30 tablet  6  . aspirin 325 MG tablet Take 1 tablet (325 mg total) by mouth daily.  30 tablet  6  . atorvastatin (LIPITOR) 40 MG tablet Take 1 tablet (40 mg total) by mouth daily.  30 tablet  6  . citalopram (CELEXA) 10 MG tablet Take 2 tablets (20 mg total) by mouth daily.  60 tablet  3  . cloNIDine (CATAPRES - DOSED IN MG/24 HR) 0.1 mg/24hr patch Place 1 patch (0.1 mg total) onto the skin once a week.  4 patch  6  . Fe Fum-Vit C-Vit B12-FA (TRIGELS-F) 460-60-0.01-1 MG CAPS Take 1 capsule  by mouth daily.  30 capsule  6  . fexofenadine (ALLEGRA) 180 MG tablet Take 180 mg by mouth daily as needed.        Marland Kitchen glucose blood test strip Use as instructed  100 each  5  . hydrALAZINE (APRESOLINE) 50 MG tablet Take 2 tablets (100 mg total) by mouth 3 (three) times daily.  180 tablet  6  . insulin detemir (LEVEMIR) 100 UNIT/ML injection Inject 100 Units into the skin daily.        . isosorbide mononitrate (IMDUR) 60 MG 24 hr tablet Take 1 tablet (60 mg total) by mouth daily.  30 tablet  6  . levothyroxine (LEVOTHROID) 125 MCG tablet Take 1 tablet (125 mcg total) by mouth daily.  30 tablet  9  . omeprazole (PRILOSEC) 20 MG capsule TAKE 1 CAPSULE BY MOUTH EVERY DAY  30 capsule  10  . torsemide (DEMADEX) 20 MG tablet Take 20 mg by mouth  daily.        Marland Kitchen zolpidem (AMBIEN) 5 MG tablet Take 5 mg by mouth at bedtime as needed.           PHYSICAL EXAM: Filed Vitals:   11/30/10 1500  BP: 132/60  Pulse: 59  Son in law present  General:  Elderly male. no resp difficulty.  walks with cane HEENT: normal except for facial droop Neck: supple. JVP 7-8  Carotids 2+ bilat; no bruits.  Cor: PMI nondisplaced. RRR.  Soft S4.  Lungs: clear Abdomen: obese .soft, nontender. No hepatomegaly. No bruits or masses. Good bowel sounds. Extremities: no cyanosis, clubbing, rash.RLE LLE 1+ edema Neuro: alert & orientedx3, cranial nerves grossly intact except for facial droop. moves all 4 extremities w/o difficulty. affect pleasant     ASSESSMENT & PLAN:

## 2010-11-30 NOTE — Assessment & Plan Note (Addendum)
NYHA III. Volume status mildly elevated. Tolerating medication. Will give Demadex 20 mg bid x 3 days then resume Demadex 20 mg daily. Will follow up in three weeks. Check BMET at that time.   Patient seen and examined with Tonye Becket, NP. We discussed all aspects of the encounter. I agree with the assessment and plan as stated above.

## 2010-11-30 NOTE — Patient Instructions (Signed)
Please take Demadex 20 mg twice a day for two days then take Demadex 20 mg daily  Please weigh and record daily   Follow up in 3 weeks

## 2010-12-03 NOTE — Discharge Summary (Signed)
  NAMEREMER, COUSE NO.:  [id number]  MEDICAL RECORD NO.:  [id number]  LOCATION:  3711                         FACILITY:  [facility]  PHYSICIAN:  [name]. Taylin Leder, MDDATE OF BIRTH:  1932-07-12  DATE OF ADMISSION:  10/18/2010 DATE OF DISCHARGE:  10/24/2010                              DISCHARGE SUMMARY   ADDENDUM  Please see previously dictated discharge summary on October 25, 2010, for complete hospital course.  There has been a medication that was transcribed incorrectly on his discharge medication list, #18 should not be Torsade but should read torsemide 20 mg 1 tablet twice daily. Otherwise, discharge medications are correct.  Again, please see complete discharge summary for full hospital course.     Leonette Monarch, PA-C   ______________________________ [name]. Lilianna Case, MD    NB/MEDQ  D:  11/13/2010  T:  11/13/2010  Job:  [id number]  Electronically Signed by [name] P.A. on 11/17/2010 01:28:46 PM Electronically Signed by Arvilla Meres MD on 12/03/2010 02:40:26 PM

## 2010-12-05 LAB — CBC
HCT: 33 — ABNORMAL LOW
MCHC: 32.7
MCV: 85.4
Platelets: 244
RDW: 16.9 — ABNORMAL HIGH

## 2010-12-05 LAB — I-STAT 8, (EC8 V) (CONVERTED LAB)
BUN: 20
Chloride: 108
HCT: 36 — ABNORMAL LOW
Operator id: 277751
pCO2, Ven: 39.4 — ABNORMAL LOW
pH, Ven: 7.362 — ABNORMAL HIGH

## 2010-12-05 LAB — POCT CARDIAC MARKERS: Operator id: 277751

## 2010-12-05 LAB — POCT I-STAT CREATININE
Creatinine, Ser: 1.6 — ABNORMAL HIGH
Operator id: 277751

## 2010-12-05 LAB — DIFFERENTIAL
Basophils Absolute: 0
Eosinophils Absolute: 0.3
Eosinophils Relative: 4

## 2010-12-17 NOTE — Progress Notes (Signed)
Patient seen and examined with Nicki Bradley PA-C. We discussed all aspects of the encounter. I agree with the assessment and plan as stated above.   

## 2010-12-21 ENCOUNTER — Ambulatory Visit (HOSPITAL_COMMUNITY)
Admission: RE | Admit: 2010-12-21 | Discharge: 2010-12-21 | Disposition: A | Discharge status: Home / Self Care | Payer: Medicare Other | Source: Ambulatory Visit | Attending: Internal Medicine | Admitting: Internal Medicine

## 2010-12-21 VITALS — BP 116/58 | HR 72 | Wt 229.2 lb

## 2010-12-21 DIAGNOSIS — I10 Essential (primary) hypertension: Secondary | ICD-10-CM | POA: Insufficient documentation

## 2010-12-21 DIAGNOSIS — I5032 Chronic diastolic (congestive) heart failure: Secondary | ICD-10-CM

## 2010-12-21 DIAGNOSIS — I509 Heart failure, unspecified: Secondary | ICD-10-CM

## 2010-12-21 LAB — BASIC METABOLIC PANEL
Chloride: 107 mEq/L (ref 96–112)
Creatinine, Ser: 1.65 mg/dL — ABNORMAL HIGH (ref 0.50–1.35)
GFR calc Af Amer: 45 mL/min — ABNORMAL LOW (ref >90)

## 2010-12-21 NOTE — Assessment & Plan Note (Signed)
Stable.  Continue current treatment

## 2010-12-21 NOTE — Patient Instructions (Signed)
Continue current medications.  May take an extra demadex if weight is increasing.   Weigh yourself daily and record.  Return for follow up in 1 month.

## 2010-12-21 NOTE — Assessment & Plan Note (Signed)
He is doing well.  Volume status looks good.  NYHA III.  Tolerating medication, will continue demadex daily.  Discussed need for daily weights and use of sliding scale demadex.  Check BMET today.

## 2010-12-21 NOTE — Progress Notes (Signed)
HPI:  Mr. Skoog is a very pleasant 77 year old male with  a history of diastolic heart failure as well as 2nd degree heart block (Wenckebach), hypertension,  hyperlipidemia, diabetes, previous CVA, paroxysmal atrial fibrillation on amiodarone. Not on coumadin as felt to be high risk of bleeding due to h/o ETOH. Echo 3/11 EF 60-65% mild MR.   Has had several hospitalizations for volume overload with most recent discharge  Sept 4th.  He diuresed with IV lasix and was discharged on torsemide 20 mg BID.  Discharge weight of 102 kg.  He has had difficult to control hypertension and his medications were titrated during hospital stay as well as a clonidine patch was started.    He returns for follow up today.  He is doing great.  He feels he is doing too well.  He denies dyspnea, orthopnea, PND or CP.  No dizziness or syncope.  He is currently compliant with his medications.   He is not weighing himself daily over the last 3 weeks.  He has a new girlfriend who is cooking for him.  His daughter continues to take care of his pill box.  He was given 3 days of extra demadex at last visit but has not needed other extra doses.      ROS: All systems negative except as listed in HPI, PMH and Problem List.  Past Medical History  Diagnosis Date  . Chronic diastolic heart failure     secondary to diastolic dysfunction EF (previous EF 35-45%)ef 60%4/09  . Arrhythmia     atrial fibrillation /pt on amiodarone,thought to be poor coumadin  . Stroke   . Other and unspecified hyperlipidemia   . Hypertension   . Hypertrophy of prostate without urinary obstruction and other lower urinary tract symptoms (LUTS)   . Osteoarthrosis, unspecified whether generalized or localized, unspecified site   . Type II or unspecified type diabetes mellitus without mention of complication, not stated as uncontrolled   . AV block, Mobitz 1     Intolerance to ACE's / discontiuation of beta blockers  . Obstructive sleep apnea   .  Iron deficiency anemia, unspecified     s/p EGD and coloscopy 3/10.gastritis.hemorrhids  . Cerebrovascular accident   . Renal insufficiency   . Obesity   . Allergic rhinitis, cause unspecified 05/29/2010  . Prostate cancer   . CAD (coronary artery disease)     cath 12/04: mLAD 50-60%, mCFX 20-30%, pRCA 50-60%, mRCA 40%    Current Outpatient Prescriptions  Medication Sig Dispense Refill  . allopurinol (ZYLOPRIM) 100 MG tablet Take 1 tablet (100 mg total) by mouth daily.  30 tablet  6  . amiodarone (PACERONE) 200 MG tablet Take 0.5 tablets (100 mg total) by mouth daily.  15 tablet  6  . amLODipine (NORVASC) 10 MG tablet Take 1 tablet (10 mg total) by mouth daily.  30 tablet  6  . aspirin 325 MG tablet Take 1 tablet (325 mg total) by mouth daily.  30 tablet  6  . atorvastatin (LIPITOR) 40 MG tablet Take 1 tablet (40 mg total) by mouth daily.  30 tablet  6  . citalopram (CELEXA) 10 MG tablet Take 2 tablets (20 mg total) by mouth daily.  60 tablet  3  . cloNIDine (CATAPRES - DOSED IN MG/24 HR) 0.1 mg/24hr patch Place 1 patch (0.1 mg total) onto the skin once a week.  4 patch  6  . Fe Fum-Vit C-Vit B12-FA (TRIGELS-F) 460-60-0.01-1 MG CAPS Take 1 capsule by  mouth daily.  30 capsule  6  . fexofenadine (ALLEGRA) 180 MG tablet Take 180 mg by mouth daily as needed.        Marland Kitchen glucose blood test strip Use as instructed  100 each  5  . hydrALAZINE (APRESOLINE) 50 MG tablet Take 2 tablets (100 mg total) by mouth 3 (three) times daily.  180 tablet  6  . insulin detemir (LEVEMIR) 100 UNIT/ML injection Inject 100 Units into the skin daily.        . isosorbide mononitrate (IMDUR) 60 MG 24 hr tablet Take 1 tablet (60 mg total) by mouth daily.  30 tablet  6  . levothyroxine (LEVOTHROID) 125 MCG tablet Take 1 tablet (125 mcg total) by mouth daily.  30 tablet  9  . omeprazole (PRILOSEC) 20 MG capsule TAKE 1 CAPSULE BY MOUTH EVERY DAY  30 capsule  10  . torsemide (DEMADEX) 20 MG tablet Take 20 mg by mouth daily.         Marland Kitchen zolpidem (AMBIEN) 5 MG tablet Take 5 mg by mouth at bedtime as needed.           PHYSICAL EXAM: Filed Vitals:   12/21/10 1431  BP: 116/58  Pulse: 72  Wt 229  General:  Elderly male. no resp difficulty.  walks with cane HEENT: normal except for facial droop Neck: supple. JVP 6-7  Carotids 2+ bilat; no bruits.  Cor: PMI nondisplaced. RRR.  Soft S4.  Lungs: clear Abdomen: obese .soft, nontender. No hepatomegaly. No bruits or masses. Good bowel sounds. Extremities: no cyanosis, clubbing, rash.RLE no edema, L ankle 1+ edema Neuro: alert & orientedx3, cranial nerves grossly intact except for facial droop. moves all 4 extremities w/o difficulty. affect pleasant     ASSESSMENT & PLAN:

## 2011-01-11 ENCOUNTER — Emergency Department (HOSPITAL_COMMUNITY): Payer: Medicare Other

## 2011-01-11 ENCOUNTER — Other Ambulatory Visit: Payer: Self-pay

## 2011-01-11 ENCOUNTER — Encounter (HOSPITAL_COMMUNITY): Payer: Self-pay | Admitting: *Deleted

## 2011-01-11 ENCOUNTER — Inpatient Hospital Stay (HOSPITAL_COMMUNITY)
Admission: EM | Admit: 2011-01-11 | Discharge: 2011-01-17 | DRG: 292 | Disposition: A | Discharge status: Home Health | Payer: Medicare Other | Attending: Internal Medicine | Admitting: Internal Medicine

## 2011-01-11 DIAGNOSIS — I2510 Atherosclerotic heart disease of native coronary artery without angina pectoris: Secondary | ICD-10-CM | POA: Insufficient documentation

## 2011-01-11 DIAGNOSIS — E1129 Type 2 diabetes mellitus with other diabetic kidney complication: Secondary | ICD-10-CM | POA: Insufficient documentation

## 2011-01-11 DIAGNOSIS — IMO0001 Reserved for inherently not codable concepts without codable children: Secondary | ICD-10-CM | POA: Diagnosis present

## 2011-01-11 DIAGNOSIS — I10 Essential (primary) hypertension: Secondary | ICD-10-CM | POA: Insufficient documentation

## 2011-01-11 DIAGNOSIS — J984 Other disorders of lung: Secondary | ICD-10-CM | POA: Diagnosis present

## 2011-01-11 DIAGNOSIS — I441 Atrioventricular block, second degree: Secondary | ICD-10-CM | POA: Insufficient documentation

## 2011-01-11 DIAGNOSIS — J40 Bronchitis, not specified as acute or chronic: Secondary | ICD-10-CM | POA: Diagnosis present

## 2011-01-11 DIAGNOSIS — E039 Hypothyroidism, unspecified: Secondary | ICD-10-CM | POA: Insufficient documentation

## 2011-01-11 DIAGNOSIS — E785 Hyperlipidemia, unspecified: Secondary | ICD-10-CM | POA: Insufficient documentation

## 2011-01-11 DIAGNOSIS — N289 Disorder of kidney and ureter, unspecified: Secondary | ICD-10-CM

## 2011-01-11 DIAGNOSIS — G4733 Obstructive sleep apnea (adult) (pediatric): Secondary | ICD-10-CM | POA: Diagnosis present

## 2011-01-11 DIAGNOSIS — I509 Heart failure, unspecified: Secondary | ICD-10-CM | POA: Diagnosis present

## 2011-01-11 DIAGNOSIS — I5033 Acute on chronic diastolic (congestive) heart failure: Principal | ICD-10-CM | POA: Diagnosis present

## 2011-01-11 DIAGNOSIS — N179 Acute kidney failure, unspecified: Secondary | ICD-10-CM | POA: Diagnosis present

## 2011-01-11 DIAGNOSIS — I5032 Chronic diastolic (congestive) heart failure: Secondary | ICD-10-CM | POA: Diagnosis present

## 2011-01-11 DIAGNOSIS — I4891 Unspecified atrial fibrillation: Secondary | ICD-10-CM | POA: Diagnosis present

## 2011-01-11 DIAGNOSIS — N183 Chronic kidney disease, stage 3 unspecified: Secondary | ICD-10-CM | POA: Insufficient documentation

## 2011-01-11 DIAGNOSIS — Z23 Encounter for immunization: Secondary | ICD-10-CM

## 2011-01-11 DIAGNOSIS — Z8673 Personal history of transient ischemic attack (TIA), and cerebral infarction without residual deficits: Secondary | ICD-10-CM

## 2011-01-11 DIAGNOSIS — I129 Hypertensive chronic kidney disease with stage 1 through stage 4 chronic kidney disease, or unspecified chronic kidney disease: Secondary | ICD-10-CM | POA: Diagnosis present

## 2011-01-11 DIAGNOSIS — D509 Iron deficiency anemia, unspecified: Secondary | ICD-10-CM | POA: Diagnosis present

## 2011-01-11 DIAGNOSIS — R55 Syncope and collapse: Secondary | ICD-10-CM

## 2011-01-11 DIAGNOSIS — I480 Paroxysmal atrial fibrillation: Secondary | ICD-10-CM | POA: Insufficient documentation

## 2011-01-11 LAB — GLUCOSE, CAPILLARY: Glucose-Capillary: 144 mg/dL — ABNORMAL HIGH (ref 70–99)

## 2011-01-11 LAB — CBC
MCH: 24.8 pg — ABNORMAL LOW (ref 26.0–34.0)
MCHC: 30.7 g/dL (ref 30.0–36.0)
MCV: 80.6 fL (ref 78.0–100.0)
Platelets: 287 10*3/uL (ref 150–400)
RDW: 20.7 % — ABNORMAL HIGH (ref 11.5–15.5)
WBC: 6.1 10*3/uL (ref 4.0–10.5)

## 2011-01-11 LAB — BASIC METABOLIC PANEL
BUN: 40 mg/dL — ABNORMAL HIGH (ref 6–23)
CO2: 23 mEq/L (ref 19–32)
Calcium: 9.2 mg/dL (ref 8.4–10.5)
Creatinine, Ser: 1.66 mg/dL — ABNORMAL HIGH (ref 0.50–1.35)
Glucose, Bld: 170 mg/dL — ABNORMAL HIGH (ref 70–99)

## 2011-01-11 LAB — POCT I-STAT, CHEM 8
Calcium, Ion: 1.15 mmol/L (ref 1.12–1.32)
HCT: 35 % — ABNORMAL LOW (ref 39.0–52.0)
Hemoglobin: 11.9 g/dL — ABNORMAL LOW (ref 13.0–17.0)
TCO2: 22 mmol/L (ref 0–100)

## 2011-01-11 LAB — CARDIAC PANEL(CRET KIN+CKTOT+MB+TROPI)
CK, MB: 3.4 ng/mL (ref 0.3–4.0)
Relative Index: INVALID (ref 0.0–2.5)
Total CK: 78 U/L (ref 7–232)
Total CK: 74 U/L (ref 7–232)
Troponin I: <0.3 ng/mL (ref <0.30)

## 2011-01-11 LAB — POCT I-STAT TROPONIN I: Troponin i, poc: 0.04 ng/mL (ref 0.00–0.08)

## 2011-01-11 LAB — PRO B NATRIURETIC PEPTIDE: Pro B Natriuretic peptide (BNP): 5.4 pg/mL (ref 0–450)

## 2011-01-11 MED ORDER — LEVOTHYROXINE SODIUM 100 MCG PO TABS
100.0000 ug | ORAL_TABLET | Freq: Every day | ORAL | Status: DC
  Administered 2011-01-12 – 2011-01-17 (×6): 100 ug via ORAL
  Filled 2011-01-11 (×7): qty 1

## 2011-01-11 MED ORDER — FUROSEMIDE 10 MG/ML IJ SOLN
40.0000 mg | Freq: Once | INTRAMUSCULAR | Status: AC
  Administered 2011-01-11: 40 mg via INTRAVENOUS
  Filled 2011-01-11: qty 4

## 2011-01-11 MED ORDER — INSULIN DETEMIR 100 UNIT/ML ~~LOC~~ SOLN
100.0000 [IU] | Freq: Every day | SUBCUTANEOUS | Status: DC
  Administered 2011-01-12 – 2011-01-17 (×6): 100 [IU] via SUBCUTANEOUS
  Filled 2011-01-11 (×3): qty 3

## 2011-01-11 MED ORDER — FUROSEMIDE 10 MG/ML IJ SOLN
INTRAMUSCULAR | Status: AC
  Filled 2011-01-11: qty 4

## 2011-01-11 MED ORDER — NITROGLYCERIN 2 % TD OINT
1.0000 [in_us] | TOPICAL_OINTMENT | Freq: Once | TRANSDERMAL | Status: AC
  Administered 2011-01-11: 1 [in_us] via TOPICAL
  Filled 2011-01-11: qty 30

## 2011-01-11 MED ORDER — FUROSEMIDE 10 MG/ML IJ SOLN
40.0000 mg | Freq: Once | INTRAMUSCULAR | Status: AC
Start: 2011-01-11 — End: 2011-01-11
  Administered 2011-01-11: 40 mg via INTRAVENOUS

## 2011-01-11 MED ORDER — ASPIRIN 81 MG PO CHEW
324.0000 mg | CHEWABLE_TABLET | Freq: Once | ORAL | Status: AC
  Administered 2011-01-11: 324 mg via ORAL
  Filled 2011-01-11: qty 1
  Filled 2011-01-11: qty 2

## 2011-01-11 MED ORDER — FUROSEMIDE 10 MG/ML IJ SOLN
60.0000 mg | Freq: Every day | INTRAMUSCULAR | Status: DC
  Administered 2011-01-12 – 2011-01-13 (×2): 60 mg via INTRAVENOUS
  Filled 2011-01-11 (×2): qty 6

## 2011-01-11 MED ORDER — HYDRALAZINE HCL 50 MG PO TABS
50.0000 mg | ORAL_TABLET | Freq: Three times a day (TID) | ORAL | Status: DC
  Administered 2011-01-11 – 2011-01-17 (×18): 50 mg via ORAL
  Filled 2011-01-11 (×21): qty 1

## 2011-01-11 MED ORDER — ALBUTEROL SULFATE HFA 108 (90 BASE) MCG/ACT IN AERS
1.0000 | INHALATION_SPRAY | RESPIRATORY_TRACT | Status: DC
  Administered 2011-01-11: 1 via RESPIRATORY_TRACT
  Filled 2011-01-11: qty 6.7

## 2011-01-11 MED ORDER — AMLODIPINE BESYLATE 5 MG PO TABS
5.0000 mg | ORAL_TABLET | Freq: Every day | ORAL | Status: DC
  Administered 2011-01-11 – 2011-01-13 (×3): 5 mg via ORAL
  Filled 2011-01-11 (×4): qty 1

## 2011-01-11 MED ORDER — SODIUM CHLORIDE 0.9 % IJ SOLN
3.0000 mL | Freq: Two times a day (BID) | INTRAMUSCULAR | Status: DC
  Administered 2011-01-11 – 2011-01-17 (×12): 3 mL via INTRAVENOUS
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
## 2011-01-11 NOTE — ED Notes (Signed)
Pt cbg 154 taken by ems

## 2011-01-11 NOTE — ED Notes (Signed)
Per ems: pt was laying on the floor. Pt had wheezing albuterol treatment given. V/s 88% sats after treatment 100%.

## 2011-01-11 NOTE — ED Provider Notes (Addendum)
History     CSN: [id number] Arrival date & time: 01/11/2011  5:19 PM   First MD Initiated Contact with Patient 01/11/11 1723      Chief Complaint  Patient presents with  . Shortness of Breath   77 year old male with a known history of chronic diastolic heart failure, atrial fibrillation, hypertension, and multiple other medical problems... very pleasant gentleman, lives alone. He states he was lying on the couch when he began to have "one of those attacks.". He describes shortness of breath, dizziness, and initially thought that his blood sugar was low. He did a fingerstick and states that it was approximately 140. The patient is on multiple medications and states he has been compliant. The patient does not smoke or drink or use any recreational drugs. He denies chest pain. Denies any nausea, vomiting. Apparently, upon EMS arrival (called by the patient himself). He states his sats were low in the high 70s and low 80s. But didn't come up with oxygen therapy. Patient apparently has previous stroke and speech impediment, very difficult to understand. He does admit to increasing leg swelling over the past several days, as well as orthopnea and some exertional dyspnea.  (Consider location/radiation/quality/duration/timing/severity/associated sxs/prior treatment) HPI  Past Medical History  Diagnosis Date  . Chronic diastolic heart failure     secondary to diastolic dysfunction EF (previous EF 35-45%)ef 60%4/09  . Arrhythmia     atrial fibrillation /pt on amiodarone,thought to be poor coumadin  . Stroke   . Other and unspecified hyperlipidemia   . Hypertension   . Hypertrophy of prostate without urinary obstruction and other lower urinary tract symptoms (LUTS)   . Osteoarthrosis, unspecified whether generalized or localized, unspecified site   . Type II or unspecified type diabetes mellitus without mention of complication, not stated as uncontrolled   . AV block, Mobitz 1     Intolerance to  ACE's / discontiuation of beta blockers  . Obstructive sleep apnea   . Iron deficiency anemia, unspecified     s/p EGD and coloscopy 3/10.gastritis.hemorrhids  . Cerebrovascular accident   . Renal insufficiency   . Obesity   . Allergic rhinitis, cause unspecified 05/29/2010  . Prostate cancer   . CAD (coronary artery disease)     cath 12/04: mLAD 50-60%, mCFX 20-30%, pRCA 50-60%, mRCA 40%    Past Surgical History  Procedure Date  . Prostate surgery   . Prostate surgury     hx of    Family History  Problem Relation Age of Onset  . Heart disease Father     History  Substance Use Topics  . Smoking status: Former Games developer  . Smokeless tobacco: Not on file  . Alcohol Use: No     history of abuse      Review of Systems  Constitutional: Negative for fever and chills.  All other systems reviewed and are negative.    Allergies  Ace inhibitors and Beta adrenergic blockers  Home Medications   Current Outpatient Rx  Name Route Sig Dispense Refill  . ALLOPURINOL 100 MG PO TABS Oral Take 1 tablet (100 mg total) by mouth daily. 30 tablet 6  . AMIODARONE HCL 200 MG PO TABS Oral Take 0.5 tablets (100 mg total) by mouth daily. 15 tablet 6  . AMLODIPINE BESYLATE 10 MG PO TABS Oral Take 1 tablet (10 mg total) by mouth daily. 30 tablet 6  . ASPIRIN 325 MG PO TABS Oral Take 1 tablet (325 mg total) by mouth daily. 30 tablet  6  . ATORVASTATIN CALCIUM 40 MG PO TABS Oral Take 1 tablet (40 mg total) by mouth daily. 30 tablet 6  . CITALOPRAM HYDROBROMIDE 10 MG PO TABS Oral Take 2 tablets (20 mg total) by mouth daily. 60 tablet 3  . CLONIDINE HCL 0.1 MG/24HR TD PTWK Transdermal Place 1 patch (0.1 mg total) onto the skin once a week. 4 patch 6  . FE FUM-VIT C-VIT B12-FA 460-60-0.01-1 MG PO CAPS Oral Take 1 capsule by mouth daily. 30 capsule 6  . FEXOFENADINE HCL 180 MG PO TABS Oral Take 180 mg by mouth daily as needed.      Marland Kitchen GLUCOSE BLOOD VI STRP  Use as instructed 100 each 5  . HYDRALAZINE  HCL 50 MG PO TABS Oral Take 2 tablets (100 mg total) by mouth 3 (three) times daily. 180 tablet 6  . INSULIN DETEMIR 100 UNIT/ML Jansen SOLN Subcutaneous Inject 100 Units into the skin daily.      . ISOSORBIDE MONONITRATE ER 60 MG PO TB24 Oral Take 1 tablet (60 mg total) by mouth daily. 30 tablet 6  . LEVOTHYROXINE SODIUM 125 MCG PO TABS Oral Take 1 tablet (125 mcg total) by mouth daily. 30 tablet 9  . OMEPRAZOLE 20 MG PO CPDR  TAKE 1 CAPSULE BY MOUTH EVERY DAY 30 capsule 10  . TORSEMIDE 20 MG PO TABS Oral Take 20 mg by mouth daily.      Marland Kitchen ZOLPIDEM TARTRATE 5 MG PO TABS Oral Take 5 mg by mouth at bedtime as needed.        BP 140/66  Pulse 74  Temp(Src) 98.4 F (36.9 C) (Oral)  Resp 20  Ht 5\' 10"  (1.778 m)  Wt 220 lb (99.791 kg)  BMI 31.57 kg/m2  SpO2 100%  Physical Exam  Constitutional: He appears well-developed and well-nourished.  HENT:  Head: Normocephalic.  Eyes: Pupils are equal, round, and reactive to light.  Cardiovascular: Normal heart sounds.   Pulmonary/Chest: Breath sounds normal.  Abdominal: Soft.  Musculoskeletal: Normal range of motion.  Neurological: He is alert.  Skin: Skin is warm and dry.    ED Course  Procedures (including critical care time)  Labs Reviewed  BASIC METABOLIC PANEL - Abnormal; Notable for the following:    Glucose, Bld 170 (*)    BUN 40 (*)    Creatinine, Ser 1.66 (*)    GFR calc non Af Amer 38 (*)    GFR calc Af Amer 45 (*)    All other components within normal limits  CBC - Abnormal; Notable for the following:    RBC 4.12 (*)    Hemoglobin 10.2 (*)    HCT 33.2 (*)    MCH 24.8 (*)    RDW 20.7 (*)    All other components within normal limits  POCT I-STAT, CHEM 8 - Abnormal; Notable for the following:    BUN 41 (*)    Creatinine, Ser 1.80 (*)    Glucose, Bld 174 (*)    Hemoglobin 11.9 (*)    HCT 35.0 (*)    All other components within normal limits  CARDIAC PANEL(CRET KIN+CKTOT+MB+TROPI)  POCT I-STAT TROPONIN I  I-STAT TROPONIN I   I-STAT, CHEM 8  PRO B NATRIURETIC PEPTIDE   Dg Chest 2 View  01/11/2011  *RADIOLOGY REPORT*  Clinical Data: Cough.  Shortness of breath.  Chest congestion. Insulin dependent diabetes.  Former smoker.  CHEST - 2 VIEW 01/11/2011:  Comparison: Two-view chest x-ray 10/19/2010 [facility] and 07/11/2009  [facility].  Findings: Cardiac silhouette moderately enlarged, unchanged since the 10/19/2010 examination but increased in size since the 07/11/2009 exam.  Patchy airspace opacities throughout both lungs. Small bilateral pleural effusions, without associated atelectasis in the lower lobes.  IMPRESSION: CHF, with moderate cardiomegaly and moderate to severe diffuse interstitial and airspace pulmonary edema.  Small bilateral pleural effusions.  Original Report Authenticated By: Arnell Sieving, M.D.     1. CHF (congestive heart failure)   2. Mobitz (type) I (Wenckebach's) atrioventricular block   3. Near syncope   4. Renal insufficiency   5. Diabetes mellitus       MDM  Pt is seen and examined;  Initial history and physical completed.  Will follow.             Patient Information       Patient Name  Sex  DOB  SSN    [name], [name]  Male  August 31, 1932  [id number]          Transcription       Type  ID  Status  Author    Discharge Summary  [id number]  Authenticated  Dolores Patty, MD        Signed by Dolores Patty, MD on 10/25/10 at 1150         Transcription Text    NAME: [name], [name] ACCOUNT NO.: [id number]  MEDICAL RECORD NO.: [id number]  LOCATION: 3711 FACILITY: [facility]  PHYSICIAN: [name]. Bensimhon, MDDATE OF BIRTH: 1932/07/27  DATE OF ADMISSION: 10/18/2010  DATE OF DISCHARGE: 10/24/2010  DISCHARGE SUMMARY  PRIMARY CARDIOLOGIST: [name]. Bensimhon, MD  PRIMARY CARE PROVIDER: Corwin Levins, MD  DISCHARGE DIAGNOSES:  1. Acute on chronic diastolic heart failure.  a. Discharge weight 102 kg.  2. Acute on chronic respiratory  insufficiency.  3. Chronic kidney disease stage III.  4. Fall risk/gait instability.  5. History of cerebrovascular accident with residual dysarthria and  weakness.  6. Hypertension, uncontrolled.  7. Mobitz I heart block.  8. Paroxysmal atrial fibrillation/atypical atrial flutter.  a. Not felt to be a Coumadin candidate secondary to high risk  for falls.  9. Iron deficiency anemia.  10.Anxiety disorder.  11.Probable obstructive sleep apnea on home oxygen nightly.  SECONDARY DIAGNOSES:  1. Hyperthyroidism.  2. Dyslipidemia.  3. History of prostate cancer.  4. History of alcohol abuse.  5. Gout.  ALLERGIES/INTOLERANCES:  1. ACE INHIBITORS causing hypotension.  2. BETA-BLOCKERS secondary to heart block.  PROCEDURES/DIAGNOSTICS PERFORMED DURING HOSPITALIZATION: Chest x-ray on  October 02, 2010, showing interval decrease in small right pleural  effusion and right basilar atelectasis. Stable mild cardiomegaly and  interstitial edema pattern.  REASON FOR HOSPITALIZATION: This is a 77 year old gentleman with the  above-stated problem list, who was recently discharged from [facility] on September 27, 2010, after treatment of acute on chronic  diastolic heart failure as well as a right pleural effusion requiring  thoracentesis on September 26, 2010, so was being evaluated by Tereso Newcomer,  PA for a post-hospitalization recheck on October 18, 2010, where he was  complaining of more shortness of breath, NYH class III symptoms. This  had worsened since hospital discharge. The patient was sleeping in his  recliner as well as using oxygen 24 hours a day instead of nightly. His  lower extremity edema was stable and did not feel that this was  increasing. Over the last 4 days, the patient had had increased  shortness of breath, his weight is up to  2 pounds by outpatient office  scales. As well, the patient was volume overloaded on exam and,  therefore, was admitted for IV diuresis.  HOSPITAL  COURSE: The patient was admitted to the step-down unit where  initial pro-BNP was 2454 which was increased from August 5 , 2012, of  667. The patient improved greatly with IV diuresis, he states his  dyspnea was much improved. The patient has been noting bouts of anxiety  and he is recently being placed on citalopram by his primary care  provider. He states that his anxiety had also improved during  hospitalization. The patient's renal function was watched closely whilein the hospital with initial creatinine/BUN of 1.62/39 with discharge  creatinine of 1.58/46. The patient was continued on IV diuresis until  October 21, 2010, where he was changed back to his oral torsemide. He  was ambulating in the halls at this time without much difficulty on O2.  Without O2, the patient's sats did decrease to 86% on room air.  Therefore, it has been noted that the patient should be placed on oxygen  during ambulation as well as at night. This has been set up.  In speaking with the patient, it appears that his discharge medications  from September 26, 2010, were not continued at home. There was a confusion  on his amlodipine in general as well as his hydralazine dose. The  patient was hypertensive during this day with systolic blood pressures  running 160s-170s. His hydralazine has been titrated. His Norvasc has  also been added back to his medical regimen as well as a clonidine  patch. The changed medications have been discussed with his daughter  who does prepare his pill boxes for the week and voices understanding of  the change. At one point in time, he also had an anemia study on September 23, 2010, which showed iron deficiency anemia. Therefore, iron has been  started this hospitalization and will be continued at discharge. The  patient's hemoglobin and hematocrit remained stable at 8.5/28.9. During  hospitalization, the patient was also evaluated by PT/OT. As noted, the  patient is definitely at high  risk for falls. It was recommended that  the patient go to a skilled nursing facility post discharge, but he is  extremely adamant about going home. This has been discussed with his  daughter who does check up on the patient several times a week as well  as helps prepare his food. We will have a Home Health nurse, PT and OT  as well as a Child psychotherapist evaluate the patient's home needs. The  patient is agreeable to this at this time. We appreciate their help in  discharge planning. We will also have a life watch system set up for  the patient at home as well as remote telemonitoring for the patient's  weight and blood pressures.  On day of discharge, Dr. Gala Romney evaluated the patient and noted him  stable for home. His volume status was at baseline. He was still  hypertensive, but again, the clonidine patch was initiated today. He  was ambulating with oxygen without difficulty. He will have close  followup in heart failure clinic. Discharge meds and instructions have  been discussed with the patient and his daughter and they voiced  understanding. Discharge pro-BNP noted 698.  DISCHARGE MEDICATIONS:  1. Amlodipine 10 mg 1 tablet daily.  2. Clonidine patch 0.1 mg/24 hours 1 patch transdermally q.7 days.  3. Ferrous sulfate 325 mg 1 tablet twice daily.  4. Citalopram 10 mg 2 tablets daily.  5. Hydralazine 50 mg 2 tablets 3 times a day.  6. Allegra 180 mg 1 tablet daily as needed for allergies.  7. Allopurinol 100 mg 1 tablet daily.  8. Ambien 5 mg 1 tablet daily at bedtime as needed.  9. Amiodarone 200 mg half a tablet daily.  10.Aspirin 325 mg 1 tablet daily.  11.Atorvastatin 40 one tablet daily.  12.Isosorbide mononitrate XR 60 mg 1 tablet daily.  13.Levemir 100 units/mL injection 100 units subcutaneously daily.  14.Levothyroxine 125 mcg daily.  15.Omeprazole 20 mg daily.  16.Potassium chloride oral liquid 20 mEq/50 mL 2.5 mL daily.  17.Spironolactone 25 mg daily.  18.Torsade 20  mg 1 tablet twice daily.  FOLLOWUP PLANS AND INSTRUCTIONS:  1. The patient will follow up in the Heart Failure Clinic with Dr.  Gala Romney on Tuesday, October 31, 2010, at 11:45 a.m.  2. The patient will follow up with Dr. Jonny Ruiz as previously scheduled or  as needed.  3. The patient is to increase activity as tolerated with help of  PT/OT.  4. The patient is to call the office in the interim for any problems  or concerns.  5. The patient is to weigh himself daily with increase of 3-4 pounds  overnight, to call the office for further instructions.  DISCHARGE LABORATORY DATA: Sodium 138, potassium 4.5, BUN 46,  creatinine 1.58. Pro-BNP at 698.  DURATION OF DISCHARGE: Greater than 30 minutes with physician and  physician assistant time.  Leonette Monarch, PA-C  ______________________________  [name]. Bensimhon, MD  NB/MEDQ D: 10/24/2010 T: 10/24/2010 Job: [id number]  cc: [name]. Bensimhon, MD  Corwin Levins, MD  Electronically Signed by [name] P.A. on 11/03/2010 12:17:49 PM  Electronically Signed by Arvilla Meres MD on 11/08/2010 10:39:30 PM                   Pt is seen and examined;  Initial history and physical completed.  Will follow.    7:02 PM  Date: 01/11/2011  Rate: 72   Rhythm: Mobitz I  QRS Axis: normal  Intervals: variable PR  ST/T Wave abnormalities: nonspecific ST changes  Conduction Disutrbances:second-degree A-V block, ( Mobitz I )  Narrative Interpretation:   Old EKG Reviewed: unchanged      Aaliyah Cancro A. Patrica Duel, MD 01/11/11 1747  Results for orders placed during the hospital encounter of 01/11/11  CARDIAC PANEL(CRET KIN+CKTOT+MB+TROPI)      Component Value Range   Total CK 78  7 - 232 (U/L)   CK, MB 3.1  0.3 - 4.0 (ng/mL)   Troponin I <0.30  <0.30 (ng/mL)   Relative Index RELATIVE INDEX IS INVALID  0.0 - 2.5   BASIC METABOLIC PANEL      Component Value Range   Sodium 136  135 - 145 (mEq/L)   Potassium 3.8  3.5 - 5.1 (mEq/L)    Chloride 102  96 - 112 (mEq/L)   CO2 23  19 - 32 (mEq/L)   Glucose, Bld 170 (*) 70 - 99 (mg/dL)   BUN 40 (*) 6 - 23 (mg/dL)   Creatinine, Ser 6.57 (*) 0.50 - 1.35 (mg/dL)   Calcium 9.2  8.4 - 84.6 (mg/dL)   GFR calc non Af Amer 38 (*) >90 (mL/min)   GFR calc Af Amer 45 (*) >90 (mL/min)  CBC      Component Value Range   WBC 6.1  4.0 - 10.5 (K/uL)   RBC 4.12 (*) 4.22 -  5.81 (MIL/uL)   Hemoglobin 10.2 (*) 13.0 - 17.0 (g/dL)   HCT 16.1 (*) 09.6 - 52.0 (%)   MCV 80.6  78.0 - 100.0 (fL)   MCH 24.8 (*) 26.0 - 34.0 (pg)   MCHC 30.7  30.0 - 36.0 (g/dL)   RDW 04.5 (*) 40.9 - 15.5 (%)   Platelets 287  150 - 400 (K/uL)  POCT I-STAT, CHEM 8      Component Value Range   Sodium 141  135 - 145 (mEq/L)   Potassium 3.9  3.5 - 5.1 (mEq/L)   Chloride 108  96 - 112 (mEq/L)   BUN 41 (*) 6 - 23 (mg/dL)   Creatinine, Ser 8.11 (*) 0.50 - 1.35 (mg/dL)   Glucose, Bld 914 (*) 70 - 99 (mg/dL)   Calcium, Ion 7.82  9.56 - 1.32 (mmol/L)   TCO2 22  0 - 100 (mmol/L)   Hemoglobin 11.9 (*) 13.0 - 17.0 (g/dL)   HCT 21.3 (*) 08.6 - 52.0 (%)  POCT I-STAT TROPONIN I      Component Value Range   Troponin i, poc 0.04  0.00 - 0.08 (ng/mL)   Comment 3            Dg Chest 2 View  01/11/2011  *RADIOLOGY REPORT*  Clinical Data: Cough.  Shortness of breath.  Chest congestion. Insulin dependent diabetes.  Former smoker.  CHEST - 2 VIEW 01/11/2011:  Comparison: Two-view chest x-ray 10/19/2010 [facility] and 07/11/2009 [facility]  Findings: Cardiac silhouette moderately enlarged, unchanged since the 10/19/2010 examination but increased in size since the 07/11/2009 exam.  Patchy airspace opacities throughout both lungs. Small bilateral pleural effusions, without associated atelectasis in the lower lobes.  IMPRESSION: CHF, with moderate cardiomegaly and moderate to severe diffuse interstitial and airspace pulmonary edema.  Small bilateral pleural effusions.  Original Report Authenticated By: Arnell Sieving,  M.D.   7:10 PM Discussed with cardiology, who will come to [facility] long ED to see and admit the patient. He remained in stable improved condition   Nataki Mccrumb A. Patrica Duel, MD 01/11/11 1910

## 2011-01-11 NOTE — ED Notes (Signed)
ZOX:[id number]<AV> Expected date:01/11/11<BR> Expected time: 4:45 PM<BR> Means of arrival:Ambulance<BR> Comments:<BR>

## 2011-01-12 LAB — CBC
HCT: 34.4 % — ABNORMAL LOW (ref 39.0–52.0)
Hemoglobin: 10.6 g/dL — ABNORMAL LOW (ref 13.0–17.0)
MCHC: 30.8 g/dL (ref 30.0–36.0)
RDW: 20.7 % — ABNORMAL HIGH (ref 11.5–15.5)
WBC: 14.3 10*3/uL — ABNORMAL HIGH (ref 4.0–10.5)

## 2011-01-12 LAB — GLUCOSE, CAPILLARY
Glucose-Capillary: 130 mg/dL — ABNORMAL HIGH (ref 70–99)
Glucose-Capillary: 165 mg/dL — ABNORMAL HIGH (ref 70–99)

## 2011-01-12 LAB — BASIC METABOLIC PANEL
BUN: 39 mg/dL — ABNORMAL HIGH (ref 6–23)
Chloride: 104 mEq/L (ref 96–112)
GFR calc Af Amer: 46 mL/min — ABNORMAL LOW (ref >90)
GFR calc non Af Amer: 39 mL/min — ABNORMAL LOW (ref >90)
Potassium: 4 mEq/L (ref 3.5–5.1)

## 2011-01-12 MED ORDER — ASPIRIN 325 MG PO TABS
325.0000 mg | ORAL_TABLET | Freq: Every day | ORAL | Status: DC
  Administered 2011-01-12 – 2011-01-17 (×6): 325 mg via ORAL
  Filled 2011-01-12 (×6): qty 1

## 2011-01-12 MED ORDER — ZOLPIDEM TARTRATE 5 MG PO TABS: 5.0000 mg | ORAL_TABLET | Freq: Every evening | ORAL | Status: DC | PRN

## 2011-01-12 MED ORDER — ISOSORBIDE MONONITRATE ER 60 MG PO TB24
60.0000 mg | ORAL_TABLET | Freq: Every day | ORAL | Status: DC
  Administered 2011-01-12 – 2011-01-17 (×6): 60 mg via ORAL
  Filled 2011-01-12 (×6): qty 1

## 2011-01-12 MED ORDER — INFLUENZA VIRUS VACC SPLIT PF IM SUSP: 0.5000 mL | INTRAMUSCULAR | Status: DC

## 2011-01-12 MED ORDER — INFLUENZA VIRUS VACC SPLIT PF IM SUSP
0.5000 mL | INTRAMUSCULAR | Status: AC
  Administered 2011-01-13: 0.5 mL via INTRAMUSCULAR
  Filled 2011-01-12: qty 0.5

## 2011-01-12 MED ORDER — AMIODARONE HCL 100 MG PO TABS
100.0000 mg | ORAL_TABLET | Freq: Every day | ORAL | Status: DC
  Administered 2011-01-12 – 2011-01-17 (×6): 100 mg via ORAL
  Filled 2011-01-12 (×6): qty 1

## 2011-01-12 MED ORDER — ALBUTEROL SULFATE HFA 108 (90 BASE) MCG/ACT IN AERS
1.0000 | INHALATION_SPRAY | Freq: Three times a day (TID) | RESPIRATORY_TRACT | Status: DC
  Administered 2011-01-12 – 2011-01-16 (×12): 1 via RESPIRATORY_TRACT
  Filled 2011-01-12 (×2): qty 6.7

## 2011-01-12 MED ORDER — ALLOPURINOL 100 MG PO TABS
100.0000 mg | ORAL_TABLET | Freq: Every day | ORAL | Status: DC
  Administered 2011-01-12 – 2011-01-17 (×6): 100 mg via ORAL
  Filled 2011-01-12 (×8): qty 1

## 2011-01-12 MED ORDER — CITALOPRAM HYDROBROMIDE 20 MG PO TABS
20.0000 mg | ORAL_TABLET | Freq: Every day | ORAL | Status: DC
  Administered 2011-01-12 – 2011-01-17 (×6): 20 mg via ORAL
  Filled 2011-01-12 (×6): qty 1

## 2011-01-12 NOTE — Progress Notes (Signed)
Patient ID: DENIZ HANNAN, male   DOB: May 01, 1932, 77 y.o.   MRN: [id number] @ Subjective:  Denies SSCP, palpitations  Dyspnea and edema improved  Objective:  Filed Vitals:   01/11/11 2100 01/11/11 2159 01/11/11 2229 01/12/11 0520  BP: 175/76   141/64  Pulse: 78  87 84  Temp: 97.4 F (36.3 C)   98.5 F (36.9 C)  TempSrc: Oral   Oral  Resp: 22  25 20   Height: 5\' 10"  (1.778 m)     Weight: 101.878 kg (224 lb 9.6 oz)   100.4 kg (221 lb 5.5 oz)  SpO2: 86% 91% 90% 96%    Intake/Output from previous day: 11/22 0701 - 11/23 0700 In: 3 [I.V.:3] Out: 1200 [Urine:1200]  Physical Exam: Poor dentition General appearance: alert, cooperative and no distress Neck: JVD - 6 cm above sternal notch, no adenopathy, no carotid bruit, no JVD, supple, symmetrical, trachea midline and thyroid not enlarged, symmetric, no tenderness/mass/nodules Lungs: Decreased BS both bases no active wheezing Heart: regular rate and rhythm, S1, S2 normal, no murmur, click, rub or gallop Abdomen: soft, non-tender; bowel sounds normal; no masses,  no organomegaly Extremities: Plus two bilateral edema Pulses: 2+ and symmetric Skin: Skin color, texture, turgor normal. No rashes or lesions  Lab Results:  Basename 01/12/11 0002 01/11/11 1807 01/11/11 1752  WBC 14.3* -- 6.1  HGB 10.6* 11.9* --  PLT 291 -- 287    Basename 01/12/11 0002 01/11/11 1807 01/11/11 1752  NA 139 141 --  K 4.0 3.9 --  CL 104 108 --  CO2 25 -- 23  GLUCOSE 134* 174* --  BUN 39* 41* --  CREATININE 1.63* 1.80* --    Basename 01/11/11 2020 01/11/11 1752  TROPONINI <0.30 <0.30   Imaging: Dg Chest 2 View  01/11/2011  *RADIOLOGY REPORT*  Clinical Data: Cough.  Shortness of breath.  Chest congestion. Insulin dependent diabetes.  Former smoker.  CHEST - 2 VIEW 01/11/2011:  Comparison: Two-view chest x-ray 10/19/2010 [facility] and 07/11/2009 [facility].  Findings: Cardiac silhouette moderately enlarged, unchanged since the  10/19/2010 examination but increased in size since the 07/11/2009 exam.  Patchy airspace opacities throughout both lungs. Small bilateral pleural effusions, without associated atelectasis in the lower lobes.  IMPRESSION: CHF, with moderate cardiomegaly and moderate to severe diffuse interstitial and airspace pulmonary edema.  Small bilateral pleural effusions.  Original Report Authenticated By: Arnell Sieving, M.D.    Cardiac Studies:  ECG:  Orders placed during the hospital encounter of 01/11/11  . EKG 12-LEAD  . EKG 12-LEAD     Telemetry:  NSR no arrythmia  Echo: 04/27/09    - Left ventricle: The cavity size was normal. There was moderate to severeconcentric hypertrophy. Systolic function was normal. The estimated ejection fraction was in the range of 60% to 65%. - Aortic valve: Trivial regurgitation. - Mitral valve: Mild regurgitation. - Left atrium: The atrium was moderately dilated.   Medications:     . albuterol  1 puff Inhalation TID  . amLODipine  5 mg Oral Daily  . aspirin  324 mg Oral Once  . furosemide      . furosemide  40 mg Intravenous Once  . furosemide  40 mg Intravenous Once  . furosemide  60 mg Intravenous Daily  . hydrALAZINE  50 mg Oral Q8H  . influenza  inactive virus vaccine  0.5 mL Intramuscular Tomorrow-1000  . insulin detemir  100 Units Subcutaneous Daily  . levothyroxine  100 mcg  Oral QAC breakfast  . nitroGLYCERIN  1 inch Topical Once  . sodium chloride  3 mL Intravenous Q12H  . DISCONTD: albuterol  1 puff Inhalation Q4H       Assessment/Plan:  CHF:  Diastolic Repeat echo some compliance issues.  Last in hospital 9/12 and seen in CHF clinic monthly.  Will transfer to care and make them aware of admission Contineu current dose of diuretic And watch for prerenal azotemia.  F/U CXR in 72 hours DM: Continue current dose of Detemir  Low carb diet Thyroid:  Continue synthroid TSH 2.33 3 months ago  Charlton Haws 01/12/2011, 7:54 AM

## 2011-01-12 NOTE — Progress Notes (Signed)
PT. ASSESSMENT IS THE SAME AS THE PREVIOUS NURSES ASSESSMENT, WILL CONTINUE TO MONITOR AND OBSERVE.

## 2011-01-12 NOTE — Plan of Care (Signed)
Problem: Phase II Progression Outcomes Goal: Case manager referral Outcome: Not Progressing Pt lives at home alone. Will need assistance at d/c. CM consult entered

## 2011-01-12 NOTE — Progress Notes (Signed)
Report call to on [facility]  4700. Pt going to 4729. No changes in initial am assess at this time...........Marland KitchenCondition stable VSS last BP 144/78 at this time. Tele SR 1 HB(primarly) and 2nd HB type 1(in and out)...............the patient asymptomatic. Will call [facility] for transport.

## 2011-01-13 LAB — GLUCOSE, CAPILLARY
Glucose-Capillary: 242 mg/dL — ABNORMAL HIGH (ref 70–99)
Glucose-Capillary: 139 mg/dL — ABNORMAL HIGH (ref 70–99)
Glucose-Capillary: 151 mg/dL — ABNORMAL HIGH (ref 70–99)

## 2011-01-13 MED ORDER — FUROSEMIDE 10 MG/ML IJ SOLN
INTRAMUSCULAR | Status: AC
Start: 2011-01-13 — End: 2011-01-13
  Administered 2011-01-13: 60 mg via INTRAVENOUS
  Filled 2011-01-13: qty 8

## 2011-01-13 MED ORDER — FUROSEMIDE 10 MG/ML IJ SOLN
60.0000 mg | Freq: Two times a day (BID) | INTRAMUSCULAR | Status: DC
  Administered 2011-01-13 – 2011-01-15 (×4): 60 mg via INTRAVENOUS
  Filled 2011-01-13 (×4): qty 6

## 2011-01-13 NOTE — Progress Notes (Signed)
Subjective: Much improved.  "I was watching my salt, but messed up doc."  Feels a lot better.      Objective:  Vital Signs in the last 24 hours: Temp:  [97.7 F (36.5 C)-98.5 F (36.9 C)] 98.4 F (36.9 C) (11/24 0600) Pulse Rate:  [60-80] 80  (11/24 0600) Resp:  [18-22] 18  (11/24 0600) BP: (129-163)/(51-78) 147/63 mmHg (11/24 0600) SpO2:  [91 %-99 %] 95 % (11/24 0741) Weight:  [100.3 kg (221 lb 1.9 oz)-100.4 kg (221 lb 5.5 oz)] 221 lb 1.9 oz (100.3 kg) (11/24 0500)  Intake/Output from previous day: 11/23 0701 - 11/24 0700 In: 220 [P.O.:220] Out: 1300 [Urine:1300]   Physical Exam: General: Well developed, well nourished, in no acute distress. Head:  Normocephalic and atraumatic. Lungs: Clear to auscultation and percussion. Heart: Normal S1 and S2.  No murmur, rubs or gallops.  Pulses: Pulses normal in all 4 extremities. Extremities: No clubbing or cyanosis. No edema. Neurologic: Alert and oriented x 3.    Lab Results:  Basename 01/12/11 0002 01/11/11 1807 01/11/11 1752  WBC 14.3* -- 6.1  HGB 10.6* 11.9* --  PLT 291 -- 287    Basename 01/12/11 0002 01/11/11 1807 01/11/11 1752  NA 139 141 --  K 4.0 3.9 --  CL 104 108 --  CO2 25 -- 23  GLUCOSE 134* 174* --  BUN 39* 41* --  CREATININE 1.63* 1.80* --    Basename 01/11/11 2020 01/11/11 1752  TROPONINI <0.30 <0.30   Hepatic Function Panel No results found for this basename: PROT,ALBUMIN,AST,ALT,ALKPHOS,BILITOT,BILIDIR,IBILI in the last 72 hours No results found for this basename: CHOL in the last 72 hours No results found for this basename: PROTIME in the last 72 hours  Imaging: Dg Chest 2 View  01/11/2011  *RADIOLOGY REPORT*  Clinical Data: Cough.  Shortness of breath.  Chest congestion. Insulin dependent diabetes.  Former smoker.  CHEST - 2 VIEW 01/11/2011:  Comparison: Two-view chest x-ray 10/19/2010 [facility] and 07/11/2009 [facility].  Findings: Cardiac silhouette moderately enlarged,  unchanged since the 10/19/2010 examination but increased in size since the 07/11/2009 exam.  Patchy airspace opacities throughout both lungs. Small bilateral pleural effusions, without associated atelectasis in the lower lobes.  IMPRESSION: CHF, with moderate cardiomegaly and moderate to severe diffuse interstitial and airspace pulmonary edema.  Small bilateral pleural effusions.  Original Report Authenticated By: Arnell Sieving, M.D.     Assessment/Plan:  Diastolic CHF:  Improved.  Still has rales, and weight not down much.  Will increase furosemide to BID.  CKD, stage 2:  Continue to monitor Anemia:  Watch AF:  See H and P      Shawnie Pons, MD, [facility], [facility] 01/13/2011, 11:34 AM

## 2011-01-13 NOTE — Progress Notes (Signed)
Pt normal HR between 40-85, however pt HR dropped to 33, 35, and 38 nonsustained pt asleep each time. Pt rhythm has been 2nd Degree Type I but converted to 1st Degree AV block at 4:30am. Pt asymptomatic.

## 2011-01-14 DIAGNOSIS — I4891 Unspecified atrial fibrillation: Secondary | ICD-10-CM

## 2011-01-14 LAB — BASIC METABOLIC PANEL
BUN: 48 mg/dL — ABNORMAL HIGH (ref 6–23)
BUN: 46 mg/dL — ABNORMAL HIGH (ref 6–23)
BUN: 48 mg/dL — ABNORMAL HIGH (ref 6–23)
CO2: 26 mEq/L (ref 19–32)
Calcium: 9.1 mg/dL (ref 8.4–10.5)
Calcium: 9.1 mg/dL (ref 8.4–10.5)
Chloride: 104 mEq/L (ref 96–112)
Creatinine, Ser: 1.75 mg/dL — ABNORMAL HIGH (ref 0.50–1.35)
Creatinine, Ser: 1.95 mg/dL — ABNORMAL HIGH (ref 0.50–1.35)
GFR calc Af Amer: 40 mL/min — ABNORMAL LOW (ref >90)
GFR calc Af Amer: 37 mL/min — ABNORMAL LOW (ref >90)
GFR calc non Af Amer: 34 mL/min — ABNORMAL LOW (ref >90)
GFR calc non Af Amer: 36 mL/min — ABNORMAL LOW (ref >90)
GFR calc non Af Amer: 32 mL/min — ABNORMAL LOW (ref >90)
Glucose, Bld: 192 mg/dL — ABNORMAL HIGH (ref 70–99)
Glucose, Bld: 130 mg/dL — ABNORMAL HIGH (ref 70–99)
Potassium: 4 mEq/L (ref 3.5–5.1)
Sodium: 138 mEq/L (ref 135–145)
Sodium: 138 mEq/L (ref 135–145)

## 2011-01-14 LAB — CBC
MCH: 24.2 pg — ABNORMAL LOW (ref 26.0–34.0)
MCV: 80.8 fL (ref 78.0–100.0)
RBC: 3.59 MIL/uL — ABNORMAL LOW (ref 4.22–5.81)
RDW: 19.9 % — ABNORMAL HIGH (ref 11.5–15.5)

## 2011-01-14 LAB — GLUCOSE, CAPILLARY: Glucose-Capillary: 161 mg/dL — ABNORMAL HIGH (ref 70–99)

## 2011-01-14 MED ORDER — AMLODIPINE BESYLATE 10 MG PO TABS
10.0000 mg | ORAL_TABLET | Freq: Every day | ORAL | Status: DC
  Administered 2011-01-14 – 2011-01-17 (×4): 10 mg via ORAL
  Filled 2011-01-14 (×5): qty 1

## 2011-01-14 NOTE — Progress Notes (Signed)
Subjective: Patient still some SOB>  Coughing up a little blood.  Still edema. Objective: Filed Vitals:   01/13/11 2235 01/14/11 0107 01/14/11 0535 01/14/11 0700  BP: 155/60 139/67 144/66   Pulse: 72 76 68   Temp: 98.4 F (36.9 C) 97.8 F (36.6 C) 97.4 F (36.3 C)   TempSrc: Oral Oral Oral   Resp: 20 21 21    Height:      Weight:   220 lb 3.8 oz (99.9 kg)   SpO2: 93% 98% 99% 99%   Weight change: -1 lb 1.6 oz (-0.5 kg)  Intake/Output Summary (Last 24 hours) at 01/14/11 0840 Last data filed at 01/14/11 0537  Gross per 24 hour  Intake    720 ml  Output   2800 ml  Net  -2080 ml    General: Alert, awake, oriented x3, in no acute distress.  HEENT: No bruits, no goiter.  NEck:  INcreased JVP Heart: Regular rate and rhythm, without murmurs, rubs, gallops.  Lungs: CTA, bilateral air movement.  Abdomen: Soft, nontender, nondistended, positive bowel sounds.  Neuro: Grossly intact, nonfocal. Ext:  1+ edema  Lab Results: Results for orders placed during the hospital encounter of 01/11/11 (from the past 24 hour(s))  GLUCOSE, CAPILLARY     Status: Abnormal   Collection Time   01/13/11 11:02 AM      Component Value Range   Glucose-Capillary 242 (*) 70 - 99 (mg/dL)   Comment 1 Documented in Chart     Comment 2 Notify RN    GLUCOSE, CAPILLARY     Status: Abnormal   Collection Time   01/13/11  4:52 PM      Component Value Range   Glucose-Capillary 139 (*) 70 - 99 (mg/dL)   Comment 1 Documented in Chart     Comment 2 Notify RN    GLUCOSE, CAPILLARY     Status: Abnormal   Collection Time   01/13/11 10:34 PM      Component Value Range   Glucose-Capillary 151 (*) 70 - 99 (mg/dL)   Comment 1 Notify RN    CBC     Status: Abnormal   Collection Time   01/14/11  6:00 AM      Component Value Range   WBC 6.1  4.0 - 10.5 (K/uL)   RBC 3.59 (*) 4.22 - 5.81 (MIL/uL)   Hemoglobin 8.7 (*) 13.0 - 17.0 (g/dL)   HCT 41.3 (*) 24.4 - 52.0 (%)   MCV 80.8  78.0 - 100.0 (fL)   MCH 24.2 (*) 26.0  - 34.0 (pg)   MCHC 30.0  30.0 - 36.0 (g/dL)   RDW 01.0 (*) 27.2 - 15.5 (%)   Platelets 268  150 - 400 (K/uL)  BASIC METABOLIC PANEL     Status: Abnormal   Collection Time   01/14/11  6:00 AM      Component Value Range   Sodium 138  135 - 145 (mEq/L)   Potassium 4.0  3.5 - 5.1 (mEq/L)   Chloride 104  96 - 112 (mEq/L)   CO2 27  19 - 32 (mEq/L)   Glucose, Bld 120 (*) 70 - 99 (mg/dL)   BUN 48 (*) 6 - 23 (mg/dL)   Creatinine, Ser 5.36 (*) 0.50 - 1.35 (mg/dL)   Calcium 9.4  8.4 - 64.4 (mg/dL)   GFR calc non Af Amer 34 (*) >90 (mL/min)   GFR calc Af Amer 40 (*) >90 (mL/min)  GLUCOSE, CAPILLARY     Status: Abnormal  Collection Time   01/14/11  6:58 AM      Component Value Range   Glucose-Capillary 105 (*) 70 - 99 (mg/dL)    Studies/Results: No results found.  Medications: I have reviewed the patient's current medications. Active Problems:    DIABETES MELLITUS, TYPE II, UNCONTROLLED  Follow CS.  Continue insulin  HYPERLIPIDEMIA  Not on statin.  WIll check lipids.  ANEMIA-IRON DEFICIENCY  Follow.  CORONARY ARTERY DISEASE  No symptoms of activie angina.  Other second degree atrioventricular block  Not tolerant to b blockers.  Atrial fibrillation  Continue on Amio.  Not on coumadin  Chronic diastolic heart failure   Diuresing.  Not back to dry wt.  COntinue  CHRONIC KIDNEY DISEASE STAGE III (MODERATE)  Follow.  Will give IV lasix this AM>  Check BMET this pm before PM dose.  HTN  Increase Norvasc to 10    LOS: 3 days   [name] 01/14/2011, 8:40 AM

## 2011-01-15 ENCOUNTER — Inpatient Hospital Stay (HOSPITAL_COMMUNITY): Payer: Medicare Other

## 2011-01-15 LAB — BASIC METABOLIC PANEL
CO2: 26 mEq/L (ref 19–32)
Calcium: 8.8 mg/dL (ref 8.4–10.5)
Chloride: 104 mEq/L (ref 96–112)
Creatinine, Ser: 1.81 mg/dL — ABNORMAL HIGH (ref 0.50–1.35)
Creatinine, Ser: 1.67 mg/dL — ABNORMAL HIGH (ref 0.50–1.35)
GFR calc Af Amer: 40 mL/min — ABNORMAL LOW (ref >90)
Glucose, Bld: 170 mg/dL — ABNORMAL HIGH (ref 70–99)

## 2011-01-15 LAB — GLUCOSE, CAPILLARY
Glucose-Capillary: 78 mg/dL (ref 70–99)
Glucose-Capillary: 121 mg/dL — ABNORMAL HIGH (ref 70–99)

## 2011-01-15 MED ORDER — FUROSEMIDE 10 MG/ML IJ SOLN
80.0000 mg | Freq: Two times a day (BID) | INTRAMUSCULAR | Status: DC
  Administered 2011-01-15 – 2011-01-16 (×3): 80 mg via INTRAVENOUS
  Filled 2011-01-15 (×5): qty 8

## 2011-01-15 MED ORDER — TORSEMIDE 20 MG PO TABS
20.0000 mg | ORAL_TABLET | Freq: Two times a day (BID) | ORAL | Status: DC
  Administered 2011-01-15: 20 mg via ORAL
  Filled 2011-01-15 (×3): qty 1

## 2011-01-15 MED ORDER — IPRATROPIUM BROMIDE 0.02 % IN SOLN
0.5000 mg | Freq: Four times a day (QID) | RESPIRATORY_TRACT | Status: DC
  Administered 2011-01-16 (×2): 0.5 mg via RESPIRATORY_TRACT
  Filled 2011-01-15 (×4): qty 2.5

## 2011-01-15 MED ORDER — DOXYCYCLINE HYCLATE 100 MG PO TABS
100.0000 mg | ORAL_TABLET | Freq: Two times a day (BID) | ORAL | Status: DC
  Administered 2011-01-15 – 2011-01-17 (×4): 100 mg via ORAL
  Filled 2011-01-15 (×5): qty 1

## 2011-01-15 NOTE — Progress Notes (Signed)
Subjective:   77 yo admitted for acute on chronic diastolic heart failure, down 4 lbs.  He has [place] [facility] for dinner and unable to watch salt intake when he eats there.    Still with SOB and cough but improving.  Blood tinged sputum.  No CP.  Afebrile.    I/Os show -6L since admit but weight essentially unchanged    Intake/Output Summary (Last 24 hours) at 01/15/11 0832 Last data filed at 01/15/11 0600  Gross per 24 hour  Intake    603 ml  Output   1650 ml  Net  -1047 ml    Current meds:    . albuterol  1 puff Inhalation TID  . allopurinol  100 mg Oral Daily  . amiodarone  100 mg Oral Daily  . amLODipine  10 mg Oral Daily  . aspirin  325 mg Oral Daily  . citalopram  20 mg Oral Daily  . furosemide  60 mg Intravenous Q12H  . hydrALAZINE  50 mg Oral Q8H  . insulin detemir  100 Units Subcutaneous Daily  . isosorbide mononitrate  60 mg Oral Daily  . levothyroxine  100 mcg Oral QAC breakfast  . sodium chloride  3 mL Intravenous Q12H  . DISCONTD: amLODipine  5 mg Oral Daily   Infusions:     Objective:  Blood pressure 142/69, pulse 67, temperature 98.4 F (36.9 C), temperature source Oral, resp. rate 18, height 5\' 10"  (1.778 m), weight 99.9 kg (220 lb 3.8 oz), SpO2 97.00%. Weight change:   Physical Exam: General:  Well appearing. No resp difficulty HEENT: normal Neck: supple. JVP 9-20 . Carotids 2+ bilat; no bruits. No lymphadenopathy or thryomegaly appreciated. Cor: PMI nondisplaced. Regular rate & rhythm. No rubs, gallops or murmurs. Lungs: bilateral rhonchi and course breath sounds  Abdomen: soft, nontender, nondistended. No hepatosplenomegaly. No bruits or masses. Good bowel sounds. Extremities: no cyanosis, clubbing, rash, 1+ bilateral ankle edema Neuro: alert & orientedx3, cranial nerves grossly intact. moves all 4 extremities w/o difficulty. Affect pleasant  Telemetry: [place]  Lab Results: Basic Metabolic Panel:  Lab 01/14/11 7829  01/14/11 0913 01/14/11 0600 01/12/11 0002 01/11/11 1807 01/11/11 1752  NA 139 138 138 139 141 --  K 3.8 3.8 -- -- -- --  CL 102 101 104 104 108 --  CO2 27 26 27 25  -- 23  GLUCOSE 130* 192* 120* 134* 174* --  BUN 48* 46* 48* 39* 41* --  CREATININE 1.95* 1.75* 1.82* 1.63* 1.80* --  CALCIUM 9.1 9.1 9.4 9.3 -- 9.2  MG -- -- -- -- -- --  PHOS -- -- -- -- -- --   Liver Function Tests: No results found for this basename: AST:5,ALT:5,ALKPHOS:5,BILITOT:5,PROT:5,ALBUMIN:5 in the last 168 hours No results found for this basename: LIPASE:5,AMYLASE:5 in the last 168 hours No results found for this basename: AMMONIA:5 in the last 168 hours CBC:  Lab 01/14/11 0600 01/12/11 0002 01/11/11 1807 01/11/11 1752  WBC 6.1 14.3* -- 6.1  NEUTROABS -- -- -- --  HGB 8.7* 10.6* 11.9* 10.2*  HCT 29.0* 34.4* 35.0* 33.2*  MCV 80.8 81.1 -- 80.6  PLT 268 291 -- 287   Cardiac Enzymes:  Lab 01/11/11 2020 01/11/11 1752  CKTOTAL 74 78  CKMB 3.4 3.1  CKMBINDEX -- --  TROPONINI <0.30 <0.30   BNP:  Lab 01/11/11 1752  POCBNP 5.4   CBG:  Lab 01/15/11 0619 01/14/11 2139 01/14/11 1638 01/14/11 1108 01/14/11 0658  GLUCAP 78 161* 130*  181* 105*   Microbiology: Lab Results  Component Value Date   CULT NO GROWTH 3 DAYS 09/26/2010   CULT Multiple bacterial morphotypes present, none predominant. Suggest appropriate recollection if clinically indicated. 07/11/2009   No results found for this basename: CULT:2,SDES:2 in the last 168 hours  Imaging: No results found.   ASSESSMENT:  1) Acute on Chronic Diastolic Heart Failure 2) Acute on Chronic Renal Failure, no ACE Inhibitor 3) AV Block, no beta blockers 4) PAF, not a coumadin candidate  5) A/C respiratory distress  PLAN/DISCUSSION:  Volume status still seems up but renal function slightly worse. Will continue. Emphasized need for fluid restriction. I also think he has overlying bronchitis. Will check CXR. Start doxycycline and nebs.    LOS: 4 days     Robbi Garter, [place] 01/15/2011, 8:32 AM  Patient seen and examined with [name] PA-C. We discussed all aspects of the encounter. I agree with the assessment and plan as stated above. Will push diuresis one more day. Treat probable bronchitis. Check CXR.   Arvilla Meres MD 5:12 PM

## 2011-01-15 NOTE — Plan of Care (Signed)
Problem: Phase I Progression Outcomes Goal: EF % per last Echo/documented,Core Reminder form on chart Outcome: Completed/Met Date Met:  01/15/11 04-27-2009 EF 60-65%

## 2011-01-15 NOTE — Progress Notes (Signed)
Pt admitted with sob. He lives alone and has family support from son and daughter. Pt has dme 02, rw, and wc. Pt would like a HH RN at d/c for disease management. CM did speak to him about nutrition and lowering NA intake. MD pt will need order for [facility] RN for disease management.  He has chosen [facility] for services for [facility] RN for disease management.  CM will not make referral until order has been written. CM will continue to f/u for additional home care needs. Gala Lewandowsky, [place] 01-15-11 [phone].

## 2011-01-15 NOTE — Progress Notes (Signed)
UR Completed. [name]  01/15/2011 [phone]   

## 2011-01-15 NOTE — Progress Notes (Signed)
Inpatient Diabetes Program Recommendations  AACE/ADA: New Consensus Statement on Inpatient Glycemic Control (2009)  Target Ranges:  Prepandial:   less than 140 mg/dL      Peak postprandial:   less than 180 mg/dL (1-2 hours)      Critically ill patients:  140 - 180 mg/dL    Inpatient Diabetes Program Recommendations Correction (SSI): start Novolog sensitive scale TID ac

## 2011-01-16 LAB — GLUCOSE, CAPILLARY
Glucose-Capillary: 125 mg/dL — ABNORMAL HIGH (ref 70–99)
Glucose-Capillary: 87 mg/dL (ref 70–99)
Glucose-Capillary: 148 mg/dL — ABNORMAL HIGH (ref 70–99)
Glucose-Capillary: 123 mg/dL — ABNORMAL HIGH (ref 70–99)

## 2011-01-16 NOTE — Progress Notes (Signed)
Subjective:   77 yo admitted for acute on chronic diastolic heart failure, down 5 lbs. Admits to dietary noncompliance. Abx and nebs started yesterday for bronchitis.    SOB/cough improved.  Blood tinged sputum cleared.  No CP.  Afebrile.  CXR with stable CHF  I/Os show -7L since admit but weight essentially unchanged    Intake/Output Summary (Last 24 hours) at 01/16/11 0824 Last data filed at 01/16/11 0539  Gross per 24 hour  Intake    720 ml  Output   2075 ml  Net  -1355 ml    Current meds:    . albuterol  1 puff Inhalation TID  . allopurinol  100 mg Oral Daily  . amiodarone  100 mg Oral Daily  . amLODipine  10 mg Oral Daily  . aspirin  325 mg Oral Daily  . citalopram  20 mg Oral Daily  . doxycycline  100 mg Oral Q12H  . furosemide  80 mg Intravenous BID  . hydrALAZINE  50 mg Oral Q8H  . insulin detemir  100 Units Subcutaneous Daily  . ipratropium  0.5 mg Nebulization QID  . isosorbide mononitrate  60 mg Oral Daily  . levothyroxine  100 mcg Oral QAC breakfast  . sodium chloride  3 mL Intravenous Q12H  . DISCONTD: furosemide  60 mg Intravenous Q12H  . DISCONTD: torsemide  20 mg Oral BID   Infusions:     Objective:  Blood pressure 148/66, pulse 54, temperature 97.3 F (36.3 C), temperature source Oral, resp. rate 20, height 5\' 10"  (1.778 m), weight 99.5 kg (219 lb 5.7 oz), SpO2 95.00%. Weight change:   Physical Exam: General:  Well appearing. No resp difficulty HEENT: normal Neck: supple. JVP 7-8 . Carotids 2+ bilat; no bruits. No lymphadenopathy or thryomegaly appreciated. Cor: PMI nondisplaced. Regular rate & rhythm. No rubs, gallops or murmurs. Lungs: occ rhonchi bilateral bases   Abdomen: soft, nontender, nondistended. No hepatosplenomegaly. No bruits or masses. Good bowel sounds. Extremities: no cyanosis, clubbing, rash, 1+ bilateral ankle edema Neuro: alert & orientedx3, cranial nerves grossly intact. moves all 4 extremities w/o difficulty. Affect  pleasant  Telemetry: [place]  Lab Results: Basic Metabolic Panel:  Lab 01/15/11 1610 01/15/11 0902 01/14/11 1646 01/14/11 0913 01/14/11 0600  NA 139 145 139 138 138  K 3.8 4.3 -- -- --  CL 102 104 102 101 104  CO2 26 30 27 26 27   GLUCOSE 170* 147* 130* 192* 120*  BUN 47* 46* 48* 46* 48*  CREATININE 1.67* 1.81* 1.95* 1.75* 1.82*  CALCIUM 8.8 9.8 9.1 9.1 9.4  MG -- -- -- -- --  PHOS -- -- -- -- --   Liver Function Tests: No results found for this basename: AST:5,ALT:5,ALKPHOS:5,BILITOT:5,PROT:5,ALBUMIN:5 in the last 168 hours No results found for this basename: LIPASE:5,AMYLASE:5 in the last 168 hours No results found for this basename: AMMONIA:5 in the last 168 hours CBC:  Lab 01/14/11 0600 01/12/11 0002 01/11/11 1807 01/11/11 1752  WBC 6.1 14.3* -- 6.1  NEUTROABS -- -- -- --  HGB 8.7* 10.6* 11.9* 10.2*  HCT 29.0* 34.4* 35.0* 33.2*  MCV 80.8 81.1 -- 80.6  PLT 268 291 -- 287   Cardiac Enzymes:  Lab 01/11/11 2020 01/11/11 1752  CKTOTAL 74 78  CKMB 3.4 3.1  CKMBINDEX -- --  TROPONINI <0.30 <0.30   BNP:  Lab 01/11/11 1752  POCBNP 5.4   CBG:  Lab 01/16/11 0733 01/16/11 0707 01/16/11 0148 01/15/11 1630 01/15/11 1116  GLUCAP 87 81 125*  174* 121*   Microbiology: Lab Results  Component Value Date   CULT NO GROWTH 3 DAYS 09/26/2010   CULT Multiple bacterial morphotypes present, none predominant. Suggest appropriate recollection if clinically indicated. 07/11/2009   No results found for this basename: CULT:2,SDES:2 in the last 168 hours  Imaging: Dg Chest 2 View  01/16/2011  *RADIOLOGY REPORT*  Clinical Data: CHF and dyspnea.  CHEST - 2 VIEW  Comparison: 01/11/2011  Findings: Stable moderate congestive heart failure.  No significant pleural effusions.  Stable heart size.  IMPRESSION: Stable CHF.  Original Report Authenticated By: Reola Calkins, M.D.     ASSESSMENT:  1) Acute on Chronic Diastolic Heart Failure 2) Acute on Chronic Renal Failure, no ACE  Inhibitor 3) AV Block, no beta blockers 4) PAF, not a coumadin candidate  5) A/C respiratory distress 6) Bronchitis   PLAN/DISCUSSION   Symptomatically much better today.  Will transition to oral diuretics this afternoon.  Ambulate with PT.  Continue with doxycycline x7 days for bronchitis.  Discussed need for dietary compliance, he voices understanding.  Possible discharge home this afternoon if ambulating in halls without difficulty.       LOS: 5 days    Robbi Garter, [place] 01/16/2011, 8:24 AM   Patient seen and examined with [name] PA-C. We discussed all aspects of the encounter. I agree with the assessment and plan as stated above.  Much improved. In looking back over previous weights 98kg is about as low as he has ever gotten (typically 102kg). Will keep IV diuresis going one more day. Also continue treatment for concurrent bronchitis. Agree with PT seeing. Hopefully home 24-48 hours.   Yoshio Seliga BensimhonMD 9:08 AM

## 2011-01-16 NOTE — Progress Notes (Signed)
PT eval complete and filed in progress section. This pt will refuse anything but going home so I would recommend HHPT/OT and HH aide. Will continue to follow in acute. Ivonne Andrew, PT, DPT [phone]

## 2011-01-16 NOTE — Progress Notes (Signed)
Physical Therapy Evaluation Patient Details Name: [name] MRN: [id number] DOB: [birth year] Today's Date: 01/16/2011  Problem List:  Patient Active Problem List  Diagnoses  . HYPERTHYROIDISM  . DIABETES MELLITUS, TYPE II, UNCONTROLLED  . HYPERLIPIDEMIA  . GOUT  . ANEMIA-IRON DEFICIENCY  . ANXIETY  . OBSTRUCTIVE SLEEP APNEA  . PERIPHERAL NEUROPATHY  . HYPERTENSION  . CORONARY ARTERY DISEASE  . Other second degree atrioventricular block  . Atrial fibrillation  . Chronic diastolic heart failure  . PEPTIC ULCER DISEASE  . CHRONIC KIDNEY DISEASE STAGE III (MODERATE)  . BENIGN PROSTATIC HYPERTROPHY  . OSTEOARTHRITIS  . INSOMNIA  . ABNORMAL EKG  . PROSTATE CANCER, HX OF  . ALCOHOL ABUSE, EPISODIC, HX OF  . CEREBROVASCULAR ACCIDENT, HX OF  . COLONIC POLYPS, HX OF  . Preventative health care  . Allergic rhinitis, cause unspecified  . Cervical radiculitis  . Epistaxis  . Hyperkalemia  . Cough  . Type II or unspecified type diabetes mellitus without mention of complication, uncontrolled  . Pneumonia  . Acute on chronic diastolic heart failure  . Edema  . Hilar adenopathy  . Bilateral hearing loss    Past Medical History:  Past Medical History  Diagnosis Date  . Chronic diastolic heart failure     secondary to diastolic dysfunction EF (previous EF 35-45%)ef 60%4/09  . Arrhythmia     atrial fibrillation /pt on amiodarone,thought to be poor coumadin  . Stroke   . Other and unspecified hyperlipidemia   . Hypertension   . Hypertrophy of prostate without urinary obstruction and other lower urinary tract symptoms (LUTS)   . Osteoarthrosis, unspecified whether generalized or localized, unspecified site   . Type II or unspecified type diabetes mellitus without mention of complication, not stated as uncontrolled   . AV block, Mobitz 1     Intolerance to ACE's / discontiuation of beta blockers  . Obstructive sleep apnea   . Iron deficiency anemia, unspecified     s/p EGD  and coloscopy 3/10.gastritis.hemorrhids  . Cerebrovascular accident   . Renal insufficiency   . Obesity   . Allergic rhinitis, cause unspecified 05/29/2010  . Prostate cancer   . CAD (coronary artery disease)     cath 12/04: mLAD 50-60%, mCFX 20-30%, pRCA 50-60%, mRCA 40%   Past Surgical History:  Past Surgical History  Procedure Date  . Prostate surgery   . Prostate surgury     hx of    PT Assessment/Plan/Recommendation PT Assessment Clinical Impression Statement: Pt well known to me from last admission. This pt needs 24 hour assist at home which he does not have and will refuse any sort of SNF or ALF. Since this is the case I would recommend HHPT/OT and HH aide for ADLS PT Recommendation/Assessment: Patient will need skilled PT in the acute care venue PT Problem List: Decreased balance;Decreased coordination Barriers to Discharge: Decreased caregiver support PT Therapy Diagnosis : Difficulty walking;Abnormality of gait PT Plan PT Frequency: Min 3X/week PT Treatment/Interventions: DME instruction;Gait training;Stair training;Functional mobility training;Therapeutic exercise;Balance training;Neuromuscular re-education;Cognitive remediation;Patient/family education PT Recommendation Recommendations for Other Services: OT consult Follow Up Recommendations: Home health PT;24 hour supervision/assistance, Home health aide Equipment Recommended: Defer to next venue PT Goals  Acute Rehab PT Goals PT Goal Formulation: With patient Time For Goal Achievement: 7 days Pt will go Supine/Side to Sit: with modified independence PT Goal: Supine/Side to Sit - Progress: Progressing toward goal Pt will Transfer Sit to Stand/Stand to Sit: with modified independence PT Transfer Goal: Sit  to Stand/Stand to Sit - Progress: Progressing toward goal Pt will Transfer Bed to Chair/Chair to Bed: with modified independence PT Transfer Goal: Bed to Chair/Chair to Bed - Progress: Progressing toward goal Pt  will Ambulate: >150 feet;with modified independence;with least restrictive assistive device PT Goal: Ambulate - Progress: Progressing toward goal Additional Goals Additional Goal #1: Pt will demo decreased risk of falls with Berg score greater than or equal to 47/56.  PT Goal: Additional Goal #1 - Progress: Progressing toward goal  PT Evaluation Precautions/Restrictions  Precautions Precautions: Fall Prior Functioning  Home Living Lives With: Alone Receives Help From: Family (daughter, son and grandchildren) Type of Home: Apartment Home Layout: One level Home Access: Level entry Home Adaptive Equipment: Environmental consultant - four wheeled Additional Comments: reports RW with seat that he sits down Prior Function Level of Independence: Requires assistive device for independence Vocation: Unemployed Comments: pt reports he is independent with ADLs at home (cooking, cleaning, bathing), I question his ability to be thorough with these he had difficulty today with pericare and even knowning that he had dry bowel in and around his buttocks; reports he thought he was just farting Cognition Cognition Arousal/Alertness: Awake/alert Overall Cognitive Status: Impaired Orientation Level: Oriented X4 Safety/Judgement: Decreased awareness of safety precautions;Decreased safety judgement for tasks assessed Decreased Safety/Judgement: Decreased awareness of need for assistance Safety/Judgement - Other Comments: pt unaware that he had dried bowel lingering on his buttocks and in his underwear; this was very clear given the smell but pt thought he was just "farting" and so sat in this all day; I question how thoroughly this man is caring for himself at home or able to manage all of his ADLs; reports he has family that can help him but would likely benefit a home health aide for cleaning, dressing, bathing etc.  Sensation/Coordination Sensation Light Touch: Appears Intact Extremity Assessment RUE Assessment RUE  Assessment: Within Functional Limits LUE Strength LUE Overall Strength Comments: prior CVA with LUE hemiparesis grossly 3-4/5, decreased coordination RLE Assessment RLE Assessment: Within Functional Limits LLE Strength LLE Overall Strength Comments: basline hemiparesis secondary to CVA, decreased coordination Mobility (including Balance) Transfers Transfers: Yes Sit to Stand: 4: Min assist;From chair/3-in-1;From toilet;With armrests (with grab bars in bathroom) Sit to Stand Details (indicate cue type and reason): min tactile and v/c's for sequencing and follow through to stand, RW in front of pt Stand to Sit: 4: Min assist Stand to Sit Details: cueing for safe technique Ambulation/Gait Ambulation/Gait: Yes Ambulation/Gait Assistance Details (indicate cue type and reason): mingaurdA with RW pt amb approx 200 ft; amb with decreased stride/step length on left (baseline from CVA); minimal lateral instability but no major LOB Ambulation Distance (Feet): 200 Feet Assistive device: Rolling walker Gait Pattern: Decreased stride length;Decreased step length - left  Dynamic Standing Balance Dynamic Standing - Balance Support: Bilateral upper extremity supported Dynamic Standing - Level of Assistance: 4: Min assist;5: Stand by assistance Dynamic Standing - Comments: pt needing cueing for safe technique and definitely requires bilateral upper extremity assist for stabilty during gait; some poor safety when it comes to decision making with RW Exercise    End of Session PT - End of Session Equipment Utilized During Treatment: Gait belt Activity Tolerance: Patient tolerated treatment well Patient left: in chair Nurse Communication: Mobility status for transfers;Mobility status for ambulation General Behavior During Session: [facility] for tasks performed Cognition: Impaired Cognitive Impairment: decreased safety awareness, decreased body awareness for hygeine, decreased awareness of need for  assist  [facility] HELEN 01/16/2011,  2:33 PM

## 2011-01-17 LAB — BASIC METABOLIC PANEL
BUN: 53 mg/dL — ABNORMAL HIGH (ref 6–23)
Chloride: 101 mEq/L (ref 96–112)
Glucose, Bld: 203 mg/dL — ABNORMAL HIGH (ref 70–99)
Potassium: 3.8 mEq/L (ref 3.5–5.1)

## 2011-01-17 LAB — GLUCOSE, CAPILLARY

## 2011-01-17 MED ORDER — ALBUTEROL SULFATE HFA 108 (90 BASE) MCG/ACT IN AERS
2.0000 | INHALATION_SPRAY | RESPIRATORY_TRACT | Status: DC | PRN
  Filled 2011-01-17: qty 6.7

## 2011-01-17 MED ORDER — DOXYCYCLINE HYCLATE 100 MG PO TABS: 100.0000 mg | ORAL_TABLET | Freq: Two times a day (BID) | ORAL | Status: AC

## 2011-01-17 MED ORDER — TORSEMIDE 20 MG PO TABS
40.0000 mg | ORAL_TABLET | Freq: Every day | ORAL | Status: DC
  Administered 2011-01-17: 40 mg via ORAL
  Filled 2011-01-17: qty 2

## 2011-01-17 NOTE — Plan of Care (Signed)
Problem: Discharge Progression Outcomes Goal: If EF < 40% ACEI/ARB addressed at discharge Outcome: Not Applicable Date Met:  01/17/11 EF 60%

## 2011-01-17 NOTE — Progress Notes (Signed)
Subjective:   77 yo admitted for acute on chronic diastolic heart failure, down 5 lbs. Admits to dietary noncompliance. Abx and nebs started 2 days ago for bronchitis.  PT note reviewed, appreciate evaluation.    No CP/SOB/cough.  Sputum white.  Ambulation slowed due to h/o stroke.       Weight remains essentially unchanged    Intake/Output Summary (Last 24 hours) at 01/17/11 1147 Last data filed at 01/17/11 0900  Gross per 24 hour  Intake    851 ml  Output   1800 ml  Net   -949 ml    Current meds:    . allopurinol  100 mg Oral Daily  . amiodarone  100 mg Oral Daily  . amLODipine  10 mg Oral Daily  . aspirin  325 mg Oral Daily  . citalopram  20 mg Oral Daily  . doxycycline  100 mg Oral Q12H  . hydrALAZINE  50 mg Oral Q8H  . insulin detemir  100 Units Subcutaneous Daily  . isosorbide mononitrate  60 mg Oral Daily  . levothyroxine  100 mcg Oral QAC breakfast  . sodium chloride  3 mL Intravenous Q12H  . torsemide  40 mg Oral Daily  . DISCONTD: albuterol  1 puff Inhalation TID  . DISCONTD: furosemide  80 mg Intravenous BID  . DISCONTD: ipratropium  0.5 mg Nebulization QID   Infusions:     Objective:  Blood pressure 153/72, pulse 66, temperature 97.9 F (36.6 C), temperature source Oral, resp. rate 18, height 5\' 10"  (1.778 m), weight 99.4 kg (219 lb 2.2 oz), SpO2 99.00%. Weight change: -0.1 kg (-3.5 oz)  Physical Exam: General:  Well appearing. No resp difficulty HEENT: normal Neck: supple. JVP 7-8 . Carotids 2+ bilat; no bruits. No lymphadenopathy or thryomegaly appreciated. Cor: PMI nondisplaced. Regular rate & rhythm. No rubs, gallops or murmurs. Lungs: occ rhonchi right base Abdomen: soft, nontender, nondistended. No hepatosplenomegaly. No bruits or masses. Good bowel sounds. Extremities: no cyanosis, clubbing, rash, trace bilateral ankle edema Neuro: alert & orientedx3, cranial nerves grossly intact. moves all 4 extremities w/o difficulty. Affect  pleasant  Telemetry: Mobitz  Lab Results: Basic Metabolic Panel:  Lab 01/17/11 1610 01/15/11 1727 01/15/11 0902 01/14/11 1646 01/14/11 0913  NA 139 139 145 139 138  K 3.8 3.8 -- -- --  CL 101 102 104 102 101  CO2 28 26 30 27 26   GLUCOSE 203* 170* 147* 130* 192*  BUN 53* 47* 46* 48* 46*  CREATININE 1.75* 1.67* 1.81* 1.95* 1.75*  CALCIUM 8.9 8.8 9.8 9.1 9.1  MG -- -- -- -- --  PHOS -- -- -- -- --   Liver Function Tests: No results found for this basename: AST:5,ALT:5,ALKPHOS:5,BILITOT:5,PROT:5,ALBUMIN:5 in the last 168 hours No results found for this basename: LIPASE:5,AMYLASE:5 in the last 168 hours No results found for this basename: AMMONIA:5 in the last 168 hours CBC:  Lab 01/14/11 0600 01/12/11 0002 01/11/11 1807 01/11/11 1752  WBC 6.1 14.3* -- 6.1  NEUTROABS -- -- -- --  HGB 8.7* 10.6* 11.9* 10.2*  HCT 29.0* 34.4* 35.0* 33.2*  MCV 80.8 81.1 -- 80.6  PLT 268 291 -- 287   Cardiac Enzymes:  Lab 01/11/11 2020 01/11/11 1752  CKTOTAL 74 78  CKMB 3.4 3.1  CKMBINDEX -- --  TROPONINI <0.30 <0.30   BNP:  Lab 01/11/11 1752  POCBNP 5.4   CBG:  Lab 01/17/11 0621 01/16/11 2134 01/16/11 1720 01/16/11 1129 01/16/11 0733  GLUCAP 129* 170* 123* 148* 87  Microbiology: Lab Results  Component Value Date   CULT NO GROWTH 3 DAYS 09/26/2010   CULT Multiple bacterial morphotypes present, none predominant. Suggest appropriate recollection if clinically indicated. 07/11/2009   No results found for this basename: CULT:2,SDES:2 in the last 168 hours  Imaging: Dg Chest 2 View  01/16/2011  *RADIOLOGY REPORT*  Clinical Data: CHF and dyspnea.  CHEST - 2 VIEW  Comparison: 01/11/2011  Findings: Stable moderate congestive heart failure.  No significant pleural effusions.  Stable heart size.  IMPRESSION: Stable CHF.  Original Report Authenticated By: Reola Calkins, M.D.     ASSESSMENT:  1) Acute on Chronic Diastolic Heart Failure 2) Acute on Chronic Renal Failure, no ACE  Inhibitor 3) AV Block, no beta blockers 4) PAF, not a coumadin candidate  5) A/C respiratory distress 6) Bronchitis   PLAN/DISCUSSION   Symptomatically improved.  Will start home diuretics today.  Will order OT consult.  Patient is agreeable to home PT/OT and home health aide.  Will order these. Continue with doxycycline x7 days for bronchitis.  Discussed need for dietary compliance, he voices understanding.  Will have home health needs set up prior to discharge.     LOS: 6 days   Patient seen and examined with [name] PA-C. We discussed all aspects of the encounter. I agree with the assessment and plan as stated above. Much improved. I think main issue was bronchitis. Weight at baseline. Continues to refuse SNF. Ready for d.c today if home health services set up.    Arvilla Meres, MD 01/17/2011, 11:47 AM

## 2011-01-17 NOTE — Progress Notes (Signed)
Patient stable upon discharge. Patient and son were given discharge instructions. Rn told them that prescriptions had been already sent to pharmacy for pick up. Patient left via wheelchair with son. No equipment was ordered for home. Home Health ordered.

## 2011-01-17 NOTE — Discharge Summary (Signed)
Patient ID: [name] MRN: [id number] DOB/AGE: 04-18-1932 77 y.o.  Admit date: 01/11/2011 Discharge date: 01/17/2011  Primary Discharge Diagnosis 1) Acute on Chronic Diastolic Heart Failure  2) Acute on Chronic Renal Failure, no ACE Inhibitor  3) A/C respiratory distress  4) Bronchitis   Secondary Discharge Diagnosis 1) AV Block, no beta blockers  2) PAF, not a coumadin candidate  3) Hypertension 4) Hyperlipidemia  5) H/o CVA  Significant Diagnostic Studies: radiology: CXR 01/16/11: CHF  Hospital Course:   Mr Buss is a 77 year old gentlemen admitted for acute on chronic respiratory failure.  He had been compliant with his medications and diet until recently when he has been visiting an "[facility] center" where he began to eat at least 1 meal a day.  He is unsure of the sodium content of the food but thinks it does not fit into a low sodium diet.  On admission,  Chest xray showed moderate cardiomegaly and moderate diffuse interstitial and airspace pulmonary edema with small bilateral pleural effusions.  He was admitted for IV diuresis.  He diuresed 7 liters and was transitioned back to oral demadex.  With diuresis he symptomatically improved.  Although symptomatically he improved his weight remained essentially unchanged.  He has remained below last discharge weight (102 kg) and currently 99.4 kg.  It was felt patient also had overlying bronchitis per physical exam and blood tinge sputum.  He was placed on doxycycline and nebulizers and this will be continued at discharge for a total of 10 days.  The patient's sputum had cleared by time of discharge.    Physical therapy was consulted while in the hospital.  He ambulated 200 feet without dyspnea but was limited by residual effects of a stroke.  There recommendations were for HHPT/OT as well as HHRN and aide because patient is unwilling to go to a skilled nursing facility.  These recommendations have been set up through [facility] as well as a Child psychotherapist.  The patient is agreeable to this.  Dietary restrictions and daily weights were again discussed in full with the patient and he voiced understanding.  Dr. Gala Romney evaluated at patient on day of discharge and felt him stable for home.    Discharge Info: Blood pressure 130/78, pulse 66, temperature 97.9 F (36.6 C), temperature source Oral, resp. rate 18, height 5\' 10"  (1.778 m), weight 99.4 kg (219 lb 2.2 oz), SpO2 99.00%.   Weight change: -0.1 kg (-3.5 oz)  Results for orders placed during the hospital encounter of 01/11/11 (from the past 24 hour(s))  GLUCOSE, CAPILLARY     Status: Abnormal   Collection Time   01/16/11  5:20 PM      Component Value Range   Glucose-Capillary 123 (*) 70 - 99 (mg/dL)  GLUCOSE, CAPILLARY     Status: Abnormal   Collection Time   01/16/11  9:34 PM      Component Value Range   Glucose-Capillary 170 (*) 70 - 99 (mg/dL)   Comment 1 Notify RN     Comment 2 Documented in Chart    GLUCOSE, CAPILLARY     Status: Abnormal   Collection Time   01/17/11  6:21 AM      Component Value Range   Glucose-Capillary 129 (*) 70 - 99 (mg/dL)   Comment 1 Notify RN    BASIC METABOLIC PANEL     Status: Abnormal   Collection Time   01/17/11  9:20 AM  Component Value Range   Sodium 139  135 - 145 (mEq/L)   Potassium 3.8  3.5 - 5.1 (mEq/L)   Chloride 101  96 - 112 (mEq/L)   CO2 28  19 - 32 (mEq/L)   Glucose, Bld 203 (*) 70 - 99 (mg/dL)   BUN 53 (*) 6 - 23 (mg/dL)   Creatinine, Ser 1.61 (*) 0.50 - 1.35 (mg/dL)   Calcium 8.9  8.4 - 09.6 (mg/dL)   GFR calc non Af Amer 36 (*) >90 (mL/min)   GFR calc Af Amer 42 (*) >90 (mL/min)  GLUCOSE, CAPILLARY     Status: Abnormal   Collection Time   01/17/11 11:27 AM      Component Value Range   Glucose-Capillary 178 (*) 70 - 99 (mg/dL)   Comment 1 Documented in Chart     Comment 2 Notify RN       Discharge Medications: Current Discharge Medication List    START taking these medications    Details  doxycycline (VIBRA-TABS) 100 MG tablet Take 1 tablet (100 mg total) by mouth every 12 (twelve) hours. Qty: 10 tablet, Refills: 0      CONTINUE these medications which have NOT CHANGED   Details  allopurinol (ZYLOPRIM) 100 MG tablet Take 1 tablet (100 mg total) by mouth daily. Qty: 30 tablet, Refills: 6    amiodarone (PACERONE) 200 MG tablet Take 0.5 tablets (100 mg total) by mouth daily. Qty: 15 tablet, Refills: 6    amLODipine (NORVASC) 10 MG tablet Take 1 tablet (10 mg total) by mouth daily. Qty: 30 tablet, Refills: 6    aspirin 325 MG tablet Take 1 tablet (325 mg total) by mouth daily. Qty: 30 tablet, Refills: 6    atorvastatin (LIPITOR) 40 MG tablet Take 1 tablet (40 mg total) by mouth daily. Qty: 30 tablet, Refills: 6    citalopram (CELEXA) 10 MG tablet Take 2 tablets (20 mg total) by mouth daily. Qty: 60 tablet, Refills: 3    cloNIDine (CATAPRES - DOSED IN MG/24 HR) 0.1 mg/24hr patch Place 1 patch onto the skin once a week. Change on sundays     Fe Fum-Vit C-Vit B12-FA (TRIGELS-F) 460-60-0.01-1 MG CAPS Take 1 capsule by mouth daily. Qty: 30 capsule, Refills: 6    hydrALAZINE (APRESOLINE) 50 MG tablet Take 2 tablets (100 mg total) by mouth 3 (three) times daily. Qty: 180 tablet, Refills: 6    insulin detemir (LEVEMIR) 100 UNIT/ML injection Inject 100 Units into the skin daily.      isosorbide mononitrate (IMDUR) 60 MG 24 hr tablet Take 1 tablet (60 mg total) by mouth daily. Qty: 30 tablet, Refills: 6    levothyroxine (LEVOTHROID) 125 MCG tablet Take 1 tablet (125 mcg total) by mouth daily. Qty: 30 tablet, Refills: 9    omeprazole (PRILOSEC) 20 MG capsule TAKE 1 CAPSULE BY MOUTH EVERY DAY Qty: 30 capsule, Refills: 10    torsemide (DEMADEX) 20 MG tablet Take 20 mg by mouth daily.      fexofenadine (ALLEGRA) 180 MG tablet Take 180 mg by mouth daily as needed. For allergies    glucose blood test strip Use as instructed Qty: 100 each, Refills: 5    zolpidem  (AMBIEN) 5 MG tablet Take 5 mg by mouth at bedtime as needed. For sleep        Follow-up Plans & Instructions: Discharge Orders    Future Appointments: Provider: Department: Dept Phone: Center:   01/23/2011 2:15 PM [facility] [phone] None  Future Orders Please Complete By Expires   Diet - low sodium heart healthy      Increase activity slowly      Discharge instructions      Comments:   Do the following things EVERYDAY: Weigh yourself in the morning before breakfast. Write it down and keep it in a log. Take your medicines as prescribed Eat low salt foods-Limit salt (sodium) to 2000mg  per day.  Stay as active as you can everyday   (HEART FAILURE PATIENTS) Call MD:  Anytime you have any of the following symptoms: 1) 3 pound weight gain in 24 hours or 5 pounds in 1 week 2) shortness of breath, with or without a dry hacking cough 3) swelling in the hands, feet or stomach 4) if you have to sleep on extra pillows at night in order to breathe.      Contraindication to ACEI at discharge        Follow-up Information    Follow up with Arvilla Meres, MD on 01/23/2011. (at 2:15 pm   (Gate Code 6000))    Contact information:   [facility] [phone]           BRING ALL MEDICATIONS WITH YOU TO FOLLOW UP APPOINTMENTS  Time spent with patient to include physician time:45 minutes Signed:  Robbi Garter, PA 01/17/2011, 3:58 PM

## 2011-01-17 NOTE — Progress Notes (Signed)
Cosign for Lindsay Welch RN assessment, medication, IV, care plan/ education, and progress notes. 

## 2011-01-19 NOTE — Discharge Summary (Signed)
Patient seen and examined with Nicki Bradley PA-C. We discussed all aspects of the encounter. I agree with the assessment and plan as stated above.   

## 2011-01-23 ENCOUNTER — Ambulatory Visit (HOSPITAL_COMMUNITY): Payer: Medicare Other

## 2011-01-25 ENCOUNTER — Encounter (HOSPITAL_COMMUNITY): Payer: Medicare Other

## 2011-01-27 ENCOUNTER — Other Ambulatory Visit: Payer: Self-pay | Admitting: Internal Medicine

## 2011-02-08 ENCOUNTER — Ambulatory Visit (HOSPITAL_COMMUNITY)
Admission: RE | Admit: 2011-02-08 | Discharge: 2011-02-08 | Disposition: A | Discharge status: Home / Self Care | Payer: Medicare Other | Source: Ambulatory Visit | Attending: Internal Medicine | Admitting: Internal Medicine

## 2011-02-08 VITALS — BP 144/60 | HR 70 | Wt 226.5 lb

## 2011-02-08 DIAGNOSIS — I509 Heart failure, unspecified: Secondary | ICD-10-CM

## 2011-02-08 DIAGNOSIS — I5032 Chronic diastolic (congestive) heart failure: Secondary | ICD-10-CM | POA: Insufficient documentation

## 2011-02-08 MED ORDER — TORSEMIDE 20 MG PO TABS: 40.0000 mg | ORAL_TABLET | Freq: Every day | ORAL | Status: DC

## 2011-02-08 NOTE — Assessment & Plan Note (Addendum)
Volume status up today.  NYHA III.  Will give metolazone 2.5 mg x2 days and increase demadex 40 mg daily.  Check labs next week with follow up.   Patient seen and examined with [name] PA-C. We discussed all aspects of the encounter. I agree with the assessment and plan as stated above. Reinforced need for daily weights and reviewed use of sliding scale diuretics. Will need to follow closely to avoid frequent readmissions. Walked in hall to check O2 sats and they dropped to 87% with activity will ned to use O2 with exertion as well as at night.   Marland Kitchen

## 2011-02-08 NOTE — Patient Instructions (Signed)
Take metolazone 2.5 mg (cut in half) for 2 days.  Increase torsemide 40 mg (2 tabs) daily.  Follow up next week.  Do the following things EVERYDAY: 1) Weigh yourself in the morning before breakfast. Write it down and keep it in a log. 2) Take your medicines as prescribed 3) Eat low salt foods-Limit salt (sodium) to 2000mg  per day.  4) Stay as active as you can everyday

## 2011-02-08 NOTE — Progress Notes (Signed)
HPI:  [name] is a very pleasant 77 year old male with  a history of diastolic heart failure as well as 2nd degree heart block (Wenckebach), hypertension,  hyperlipidemia, diabetes, previous CVA, paroxysmal atrial fibrillation on amiodarone. Not on coumadin as felt to be high risk of bleeding due to h/o ETOH. Echo 3/11 EF 60-65% mild MR.   Here for follow up today with his son.  He is feeling ok.  Coughing up white phlegm.  Chronically sleeps in a recliner.  No c/o dyspnea.  No dizziness or syncope.   Says his weight is a steady 220 at home.  Trying to eat better.  Has not taken extra demadex.      ROS: All systems negative except as listed in HPI, PMH and Problem List.  Past Medical History  Diagnosis Date  . Chronic diastolic heart failure     secondary to diastolic dysfunction EF (previous EF 35-45%)ef 60%4/09  . Arrhythmia     atrial fibrillation /pt on amiodarone,thought to be poor coumadin  . Stroke   . Other and unspecified hyperlipidemia   . Hypertension   . Hypertrophy of prostate without urinary obstruction and other lower urinary tract symptoms (LUTS)   . Osteoarthrosis, unspecified whether generalized or localized, unspecified site   . Type II or unspecified type diabetes mellitus without mention of complication, not stated as uncontrolled   . AV block, Mobitz 1     Intolerance to ACE's / discontiuation of beta blockers  . Obstructive sleep apnea   . Iron deficiency anemia, unspecified     s/p EGD and coloscopy 3/10.gastritis.hemorrhids  . Cerebrovascular accident   . Renal insufficiency   . Obesity   . Allergic rhinitis, cause unspecified 05/29/2010  . Prostate cancer   . CAD (coronary artery disease)     cath 12/04: mLAD 50-60%, mCFX 20-30%, pRCA 50-60%, mRCA 40%    Current Outpatient Prescriptions  Medication Sig Dispense Refill  . allopurinol (ZYLOPRIM) 100 MG tablet Take 1 tablet (100 mg total) by mouth daily.  30 tablet  6  . amiodarone (PACERONE) 200 MG  tablet Take 0.5 tablets (100 mg total) by mouth daily.  15 tablet  6  . amLODipine (NORVASC) 10 MG tablet Take 1 tablet (10 mg total) by mouth daily.  30 tablet  6  . aspirin 325 MG tablet Take 1 tablet (325 mg total) by mouth daily.  30 tablet  6  . atorvastatin (LIPITOR) 40 MG tablet Take 1 tablet (40 mg total) by mouth daily.  30 tablet  6  . citalopram (CELEXA) 10 MG tablet Take 2 tablets (20 mg total) by mouth daily.  60 tablet  3  . cloNIDine (CATAPRES - DOSED IN MG/24 HR) 0.1 mg/24hr patch Place 1 patch onto the skin once a week. Change on sundays       . Fe Fum-Vit C-Vit B12-FA (TRIGELS-F) 460-60-0.01-1 MG CAPS Take 1 capsule by mouth daily.  30 capsule  6  . fexofenadine (ALLEGRA) 180 MG tablet Take 180 mg by mouth daily as needed. For allergies      . glucose blood test strip Use as instructed  100 each  5  . hydrALAZINE (APRESOLINE) 50 MG tablet        . insulin detemir (LEVEMIR) 100 UNIT/ML injection Inject 100 Units into the skin daily.        . isosorbide mononitrate (IMDUR) 60 MG 24 hr tablet Take 1 tablet (60 mg total) by mouth daily.  30 tablet  6  .  levothyroxine (LEVOTHROID) 125 MCG tablet Take 1 tablet (125 mcg total) by mouth daily.  30 tablet  9  . omeprazole (PRILOSEC) 20 MG capsule TAKE 1 CAPSULE BY MOUTH EVERY DAY  30 capsule  10  . torsemide (DEMADEX) 20 MG tablet Take 20 mg by mouth daily.        Marland Kitchen zolpidem (AMBIEN) 5 MG tablet Take 5 mg by mouth at bedtime as needed. For sleep         PHYSICAL EXAM: Filed Vitals:   02/08/11 1236  BP: 144/60  Pulse: 70  Wt 226  General:  Elderly male. no resp difficulty.  walks with cane HEENT: normal except for facial droop Neck: supple. JVP 9-10.  Carotids 2+ bilat; no bruits.  Cor: PMI nondisplaced. RRR.  Soft S4.  Lungs: rhonchi/rales bilaterally  Abdomen: obese .soft, nontender. No hepatomegaly. No bruits or masses. Good bowel sounds. Extremities: no cyanosis, clubbing, rash.  2-3+ bilateral edema.   Neuro: alert &  orientedx3, cranial nerves grossly intact except for facial droop. moves all 4 extremities w/o difficulty. affect pleasant     ASSESSMENT & PLAN:

## 2011-02-15 ENCOUNTER — Ambulatory Visit (HOSPITAL_COMMUNITY)
Admission: RE | Admit: 2011-02-15 | Discharge: 2011-02-15 | Disposition: A | Discharge status: Home / Self Care | Payer: Medicare Other | Source: Ambulatory Visit | Attending: Internal Medicine | Admitting: Internal Medicine

## 2011-02-15 DIAGNOSIS — I509 Heart failure, unspecified: Secondary | ICD-10-CM

## 2011-02-15 DIAGNOSIS — I5032 Chronic diastolic (congestive) heart failure: Secondary | ICD-10-CM | POA: Insufficient documentation

## 2011-02-15 NOTE — Assessment & Plan Note (Addendum)
He is doing well.  Volume status looks good today.  NYHA III.  Tolerating medication, will continue demadex twice daily.  Continue daily weights and use of sliding scale demadex.   O2 sats low while ambulating, have asked him to wear O2 with ambulation and he agrees.  Follow up 4 weeks.   Patient seen and examined with [name] PA-C. We discussed all aspects of the encounter. I agree with the assessment and plan as stated above. Doing well. Volume status much improved. Will continue current meds. Check labs.

## 2011-02-15 NOTE — Assessment & Plan Note (Signed)
Stable.  Continue current treatment

## 2011-02-15 NOTE — Progress Notes (Signed)
HPI:  [name] is a very pleasant 77 year old male with  a history of diastolic heart failure as well as 2nd degree heart block (Wenckebach), hypertension,  hyperlipidemia, diabetes, previous CVA, and paroxysmal atrial fibrillation on amiodarone. Not on coumadin as felt to be high risk of bleeding due to h/o ETOH. Echo 3/11 EF 60-65% mild MR.   Last visit patient had extra fluid on board and  Metolazone 2.5 mg given for 2 days with increase of demadex to 20 mg BID.  He returns for follow up with his son today.  His cough and SOB has improved.  Continues to sleep in a recliner but this is chronic.  He is trying to eat better.     O2 sat low after walking back to clinic (86%).  After sitting O2 went up to 94%.  With ambulation again O2 down to 87%.      ROS: All systems negative except as listed in HPI, PMH and Problem List.  Past Medical History  Diagnosis Date  . Chronic diastolic heart failure     secondary to diastolic dysfunction EF (previous EF 35-45%)ef 60%4/09  . Arrhythmia     atrial fibrillation /pt on amiodarone,thought to be poor coumadin  . Stroke   . Other and unspecified hyperlipidemia   . Hypertension   . Hypertrophy of prostate without urinary obstruction and other lower urinary tract symptoms (LUTS)   . Osteoarthrosis, unspecified whether generalized or localized, unspecified site   . Type II or unspecified type diabetes mellitus without mention of complication, not stated as uncontrolled   . AV block, Mobitz 1     Intolerance to ACE's / discontiuation of beta blockers  . Obstructive sleep apnea   . Iron deficiency anemia, unspecified     s/p EGD and coloscopy 3/10.gastritis.hemorrhids  . Cerebrovascular accident   . Renal insufficiency   . Obesity   . Allergic rhinitis, cause unspecified 05/29/2010  . Prostate cancer   . CAD (coronary artery disease)     cath 12/04: mLAD 50-60%, mCFX 20-30%, pRCA 50-60%, mRCA 40%    Current Outpatient Prescriptions  Medication  Sig Dispense Refill  . allopurinol (ZYLOPRIM) 100 MG tablet Take 1 tablet (100 mg total) by mouth daily.  30 tablet  6  . amiodarone (PACERONE) 200 MG tablet Take 0.5 tablets (100 mg total) by mouth daily.  15 tablet  6  . amLODipine (NORVASC) 10 MG tablet Take 1 tablet (10 mg total) by mouth daily.  30 tablet  6  . aspirin 325 MG tablet Take 1 tablet (325 mg total) by mouth daily.  30 tablet  6  . atorvastatin (LIPITOR) 40 MG tablet Take 1 tablet (40 mg total) by mouth daily.  30 tablet  6  . citalopram (CELEXA) 10 MG tablet Take 2 tablets (20 mg total) by mouth daily.  60 tablet  3  . cloNIDine (CATAPRES - DOSED IN MG/24 HR) 0.1 mg/24hr patch Place 1 patch onto the skin once a week. Change on sundays       . Fe Fum-Vit C-Vit B12-FA (TRIGELS-F) 460-60-0.01-1 MG CAPS Take 1 capsule by mouth daily.  30 capsule  6  . fexofenadine (ALLEGRA) 180 MG tablet Take 180 mg by mouth daily as needed. For allergies      . glucose blood test strip Use as instructed  100 each  5  . insulin detemir (LEVEMIR) 100 UNIT/ML injection Inject 100 Units into the skin daily.        Marland Kitchen  isosorbide mononitrate (IMDUR) 60 MG 24 hr tablet Take 1 tablet (60 mg total) by mouth daily.  30 tablet  6  . levothyroxine (LEVOTHROID) 125 MCG tablet Take 1 tablet (125 mcg total) by mouth daily.  30 tablet  9  . omeprazole (PRILOSEC) 20 MG capsule TAKE 1 CAPSULE BY MOUTH EVERY DAY  30 capsule  10  . hydrALAZINE (APRESOLINE) 50 MG tablet 50 mg 3 (three) times daily.       Marland Kitchen torsemide (DEMADEX) 20 MG tablet Take 20 mg by mouth 2 (two) times daily.        Marland Kitchen zolpidem (AMBIEN) 5 MG tablet Take 5 mg by mouth at bedtime as needed. For sleep         PHYSICAL EXAM: Filed Vitals:   02/15/11 1409  BP: 146/62  Pulse: 75  Weight: 223 lb 12 oz (101.492 kg)  SpO2: 86%  O2 back up to 94% after sitting down.    General:  Elderly male. no resp difficulty. walks with rolling walker today HEENT: normal except for facial droop Neck: supple. JVP  7-8.  Carotids 2+ bilat; no bruits.  Cor: PMI nondisplaced. RRR.  Soft S4.  Lungs: CTA  Abdomen: obese .soft, nontender. Mild distention, Good bowel sounds. Extremities: no cyanosis, clubbing, rash. Trace LE edema    Neuro: alert & orientedx3, cranial nerves grossly intact except for facial droop. moves all 4 extremities w/o difficulty. affect pleasant     ASSESSMENT & PLAN:

## 2011-02-24 ENCOUNTER — Other Ambulatory Visit: Payer: Self-pay | Admitting: Internal Medicine

## 2011-03-07 ENCOUNTER — Other Ambulatory Visit: Payer: Self-pay

## 2011-03-07 MED ORDER — INSULIN DETEMIR 100 UNIT/ML ~~LOC~~ SOLN: 100.0000 [IU] | Freq: Every day | SUBCUTANEOUS | Status: DC

## 2011-03-23 ENCOUNTER — Other Ambulatory Visit: Payer: Self-pay | Admitting: Internal Medicine

## 2011-04-23 ENCOUNTER — Other Ambulatory Visit: Payer: Self-pay | Admitting: *Deleted

## 2011-04-23 MED ORDER — TORSEMIDE 20 MG PO TABS: 20.0000 mg | ORAL_TABLET | Freq: Two times a day (BID) | ORAL | Status: DC

## 2011-05-29 ENCOUNTER — Telehealth (HOSPITAL_COMMUNITY): Payer: Self-pay | Admitting: *Deleted

## 2011-05-29 MED ORDER — FE FUM-VIT C-VIT B12-FA 460-60-0.01-1 MG PO CAPS: 1.0000 | ORAL_CAPSULE | Freq: Every day | ORAL | Status: DC

## 2011-05-29 NOTE — Telephone Encounter (Signed)
Pt's daughter aware that rx was sent in

## 2011-05-29 NOTE — Telephone Encounter (Signed)
[name] called today for her dad, he is out of his iron gels and needs a new prescription called into [facility].  Thanks.

## 2011-06-05 ENCOUNTER — Telehealth: Payer: Self-pay | Admitting: Physician Assistant

## 2011-06-05 MED ORDER — ISOSORBIDE MONONITRATE ER 60 MG PO TB24: 60.0000 mg | ORAL_TABLET | Freq: Every day | ORAL | Status: DC

## 2011-06-05 MED ORDER — AMLODIPINE BESYLATE 10 MG PO TABS: 10.0000 mg | ORAL_TABLET | Freq: Every day | ORAL | Status: DC

## 2011-06-05 NOTE — Telephone Encounter (Signed)
Pt dtr called stating she had called the office on Friday and asked for refills on his meds. (no phone note seen) She was calling back to say no meds had been ordered. She requested amlodipine and Imdur. Called the [facility] and called them in.

## 2011-06-07 ENCOUNTER — Other Ambulatory Visit: Payer: Self-pay | Admitting: *Deleted

## 2011-06-07 MED ORDER — HYDRALAZINE HCL 50 MG PO TABS: 50.0000 mg | ORAL_TABLET | Freq: Three times a day (TID) | ORAL | Status: DC

## 2011-06-11 ENCOUNTER — Other Ambulatory Visit: Payer: Self-pay | Admitting: Internal Medicine

## 2011-06-11 MED ORDER — AMIODARONE HCL 200 MG PO TABS: 100.0000 mg | ORAL_TABLET | Freq: Every day | ORAL | Status: DC

## 2011-06-15 ENCOUNTER — Other Ambulatory Visit: Payer: Self-pay | Admitting: Internal Medicine

## 2011-06-23 ENCOUNTER — Other Ambulatory Visit: Payer: Self-pay | Admitting: Internal Medicine

## 2011-06-25 ENCOUNTER — Other Ambulatory Visit: Payer: Self-pay

## 2011-06-25 MED ORDER — CITALOPRAM HYDROBROMIDE 10 MG PO TABS: 10.0000 mg | ORAL_TABLET | Freq: Two times a day (BID) | ORAL | Status: DC

## 2011-07-03 ENCOUNTER — Other Ambulatory Visit: Payer: Self-pay | Admitting: *Deleted

## 2011-07-04 ENCOUNTER — Other Ambulatory Visit: Payer: Self-pay | Admitting: *Deleted

## 2011-07-04 MED ORDER — HYDRALAZINE HCL 50 MG PO TABS: 50.0000 mg | ORAL_TABLET | Freq: Three times a day (TID) | ORAL | Status: DC

## 2011-07-22 ENCOUNTER — Other Ambulatory Visit: Payer: Self-pay | Admitting: Internal Medicine

## 2011-07-23 ENCOUNTER — Other Ambulatory Visit: Payer: Self-pay | Admitting: Internal Medicine

## 2011-07-23 MED ORDER — CLONIDINE HCL 0.1 MG/24HR TD PTWK: 1.0000 | MEDICATED_PATCH | TRANSDERMAL | Status: DC

## 2011-07-30 ENCOUNTER — Other Ambulatory Visit: Payer: Self-pay | Admitting: Internal Medicine

## 2011-09-15 ENCOUNTER — Other Ambulatory Visit: Payer: Self-pay | Admitting: Physician Assistant

## 2011-09-17 ENCOUNTER — Other Ambulatory Visit (HOSPITAL_COMMUNITY): Payer: Self-pay

## 2011-09-17 MED ORDER — AMIODARONE HCL 200 MG PO TABS: 100.0000 mg | ORAL_TABLET | Freq: Every day | ORAL | Status: DC

## 2011-09-17 NOTE — Telephone Encounter (Signed)
..   Requested Prescriptions   Pending Prescriptions Disp Refills  . amiodarone (PACERONE) 200 MG tablet 15 tablet 6    Sig: Take 0.5 tablets (100 mg total) by mouth daily.

## 2011-09-28 ENCOUNTER — Other Ambulatory Visit (HOSPITAL_COMMUNITY): Payer: Self-pay | Admitting: Cardiology

## 2011-09-28 MED ORDER — ALLOPURINOL 100 MG PO TABS: 100.0000 mg | ORAL_TABLET | Freq: Every day | ORAL | Status: DC

## 2011-10-08 ENCOUNTER — Other Ambulatory Visit (HOSPITAL_COMMUNITY): Payer: Self-pay | Admitting: Cardiology

## 2011-10-08 DIAGNOSIS — M109 Gout, unspecified: Secondary | ICD-10-CM

## 2011-10-08 MED ORDER — ALLOPURINOL 100 MG PO TABS: 100.0000 mg | ORAL_TABLET | Freq: Every day | ORAL | Status: DC

## 2011-10-17 ENCOUNTER — Other Ambulatory Visit: Payer: Self-pay | Admitting: Internal Medicine

## 2011-10-24 ENCOUNTER — Other Ambulatory Visit (HOSPITAL_COMMUNITY): Payer: Self-pay | Admitting: *Deleted

## 2011-10-24 MED ORDER — CLONIDINE HCL 0.1 MG/24HR TD PTWK: 1.0000 | MEDICATED_PATCH | TRANSDERMAL | Status: DC

## 2011-11-18 ENCOUNTER — Other Ambulatory Visit: Payer: Self-pay | Admitting: Internal Medicine

## 2011-11-29 ENCOUNTER — Other Ambulatory Visit: Payer: Self-pay | Admitting: Internal Medicine

## 2011-12-03 ENCOUNTER — Other Ambulatory Visit (HOSPITAL_COMMUNITY): Payer: Self-pay | Admitting: *Deleted

## 2011-12-03 MED ORDER — ATORVASTATIN CALCIUM 40 MG PO TABS: 40.0000 mg | ORAL_TABLET | Freq: Every day | ORAL | Status: DC

## 2011-12-23 ENCOUNTER — Other Ambulatory Visit: Payer: Self-pay | Admitting: Internal Medicine

## 2011-12-24 ENCOUNTER — Other Ambulatory Visit: Payer: Self-pay | Admitting: Physician Assistant

## 2011-12-24 ENCOUNTER — Other Ambulatory Visit (HOSPITAL_COMMUNITY): Payer: Self-pay | Admitting: *Deleted

## 2011-12-24 MED ORDER — FE FUM-VIT C-VIT B12-FA 460-60-0.01-1 MG PO CAPS: 1.0000 | ORAL_CAPSULE | Freq: Every day | ORAL | Status: DC

## 2012-01-07 ENCOUNTER — Inpatient Hospital Stay (HOSPITAL_COMMUNITY)
Admission: EM | Admit: 2012-01-07 | Discharge: 2012-01-12 | DRG: 292 | Disposition: A | Discharge status: Home Health | Payer: Medicare Other | Attending: Cardiology | Admitting: Cardiology

## 2012-01-07 ENCOUNTER — Other Ambulatory Visit: Payer: Self-pay | Admitting: Internal Medicine

## 2012-01-07 ENCOUNTER — Encounter (HOSPITAL_COMMUNITY): Payer: Self-pay | Admitting: *Deleted

## 2012-01-07 ENCOUNTER — Emergency Department (HOSPITAL_COMMUNITY): Payer: Medicare Other

## 2012-01-07 DIAGNOSIS — Z9981 Dependence on supplemental oxygen: Secondary | ICD-10-CM

## 2012-01-07 DIAGNOSIS — E119 Type 2 diabetes mellitus without complications: Secondary | ICD-10-CM | POA: Diagnosis present

## 2012-01-07 DIAGNOSIS — G4733 Obstructive sleep apnea (adult) (pediatric): Secondary | ICD-10-CM | POA: Diagnosis present

## 2012-01-07 DIAGNOSIS — N179 Acute kidney failure, unspecified: Secondary | ICD-10-CM | POA: Diagnosis present

## 2012-01-07 DIAGNOSIS — E785 Hyperlipidemia, unspecified: Secondary | ICD-10-CM | POA: Diagnosis present

## 2012-01-07 DIAGNOSIS — Z91199 Patient's noncompliance with other medical treatment and regimen due to unspecified reason: Secondary | ICD-10-CM

## 2012-01-07 DIAGNOSIS — N400 Enlarged prostate without lower urinary tract symptoms: Secondary | ICD-10-CM | POA: Diagnosis present

## 2012-01-07 DIAGNOSIS — E876 Hypokalemia: Secondary | ICD-10-CM | POA: Diagnosis present

## 2012-01-07 DIAGNOSIS — I4891 Unspecified atrial fibrillation: Secondary | ICD-10-CM | POA: Diagnosis present

## 2012-01-07 DIAGNOSIS — I10 Essential (primary) hypertension: Secondary | ICD-10-CM

## 2012-01-07 DIAGNOSIS — N189 Chronic kidney disease, unspecified: Secondary | ICD-10-CM | POA: Diagnosis present

## 2012-01-07 DIAGNOSIS — Z8673 Personal history of transient ischemic attack (TIA), and cerebral infarction without residual deficits: Secondary | ICD-10-CM

## 2012-01-07 DIAGNOSIS — Z9119 Patient's noncompliance with other medical treatment and regimen: Secondary | ICD-10-CM

## 2012-01-07 DIAGNOSIS — I129 Hypertensive chronic kidney disease with stage 1 through stage 4 chronic kidney disease, or unspecified chronic kidney disease: Secondary | ICD-10-CM | POA: Diagnosis present

## 2012-01-07 DIAGNOSIS — I509 Heart failure, unspecified: Secondary | ICD-10-CM

## 2012-01-07 DIAGNOSIS — E669 Obesity, unspecified: Secondary | ICD-10-CM | POA: Diagnosis present

## 2012-01-07 DIAGNOSIS — E039 Hypothyroidism, unspecified: Secondary | ICD-10-CM | POA: Diagnosis present

## 2012-01-07 DIAGNOSIS — Z7982 Long term (current) use of aspirin: Secondary | ICD-10-CM

## 2012-01-07 DIAGNOSIS — D509 Iron deficiency anemia, unspecified: Secondary | ICD-10-CM | POA: Diagnosis present

## 2012-01-07 DIAGNOSIS — I441 Atrioventricular block, second degree: Secondary | ICD-10-CM | POA: Diagnosis present

## 2012-01-07 DIAGNOSIS — Z8546 Personal history of malignant neoplasm of prostate: Secondary | ICD-10-CM

## 2012-01-07 DIAGNOSIS — I5033 Acute on chronic diastolic (congestive) heart failure: Principal | ICD-10-CM | POA: Diagnosis present

## 2012-01-07 DIAGNOSIS — I2510 Atherosclerotic heart disease of native coronary artery without angina pectoris: Secondary | ICD-10-CM | POA: Diagnosis present

## 2012-01-07 DIAGNOSIS — Z87891 Personal history of nicotine dependence: Secondary | ICD-10-CM

## 2012-01-07 DIAGNOSIS — I5032 Chronic diastolic (congestive) heart failure: Secondary | ICD-10-CM

## 2012-01-07 DIAGNOSIS — Z23 Encounter for immunization: Secondary | ICD-10-CM

## 2012-01-07 DIAGNOSIS — Z79899 Other long term (current) drug therapy: Secondary | ICD-10-CM

## 2012-01-07 LAB — POCT I-STAT TROPONIN I: Troponin i, poc: 0.05 ng/mL (ref 0.00–0.08)

## 2012-01-07 LAB — CBC WITH DIFFERENTIAL/PLATELET
Basophils Absolute: 0 10*3/uL (ref 0.0–0.1)
Basophils Relative: 0 % (ref 0–1)
HCT: 30.7 % — ABNORMAL LOW (ref 39.0–52.0)
Lymphocytes Relative: 11 % — ABNORMAL LOW (ref 12–46)
MCHC: 31.6 g/dL (ref 30.0–36.0)
Monocytes Absolute: 0.5 10*3/uL (ref 0.1–1.0)
Neutro Abs: 7 10*3/uL (ref 1.7–7.7)
Neutrophils Relative %: 81 % — ABNORMAL HIGH (ref 43–77)
Platelets: 303 10*3/uL (ref 150–400)
RDW: 17.3 % — ABNORMAL HIGH (ref 11.5–15.5)
WBC: 8.8 10*3/uL (ref 4.0–10.5)

## 2012-01-07 LAB — COMPREHENSIVE METABOLIC PANEL
Albumin: 3.2 g/dL — ABNORMAL LOW (ref 3.5–5.2)
Alkaline Phosphatase: 70 U/L (ref 39–117)
BUN: 34 mg/dL — ABNORMAL HIGH (ref 6–23)
CO2: 27 mEq/L (ref 19–32)
Chloride: 105 mEq/L (ref 96–112)
GFR calc non Af Amer: 32 mL/min — ABNORMAL LOW (ref >90)
Potassium: 4.2 mEq/L (ref 3.5–5.1)
Total Bilirubin: 0.3 mg/dL (ref 0.3–1.2)

## 2012-01-07 LAB — PRO B NATRIURETIC PEPTIDE: Pro B Natriuretic peptide (BNP): 1845 pg/mL — ABNORMAL HIGH (ref 0–450)

## 2012-01-07 MED ORDER — FUROSEMIDE 10 MG/ML IJ SOLN
40.0000 mg | Freq: Once | INTRAMUSCULAR | Status: AC
  Administered 2012-01-07: 40 mg via INTRAVENOUS
  Filled 2012-01-07: qty 4

## 2012-01-07 NOTE — ED Provider Notes (Signed)
77 year old male with history of CHF is noted increasing difficulty with dyspnea and increasing leg swelling over the last week. He states that he is supposed to weigh himself daily but has stopped doing that. He denies any chest pain. On exam, he has 3+ pitting edema of the legs and a 1+ presacral edema. Lungs are clear and heart has regular rate and rhythm. He clearly has a significant exacerbation of congestive heart failure and will need to be admitted for diuresis.   Date: 01/07/2012  Rate: 62  Rhythm: normal sinus rhythm  QRS Axis: normal  Intervals: PR prolonged  ST/T Wave abnormalities: nonspecific ST changes  Conduction Disutrbances:second-degree A-V block, ( Mobitz I )  Narrative Interpretation: Mobitz 1 second-degree A-V block with nonspecific ST changes. When compared with ECG of 01/11/2011, no significant changes are seen.  Old EKG Reviewed: unchanged  Medical screening examination/treatment/procedure(s) were conducted as a shared visit with non-physician practitioner(s) and myself.  I personally evaluated the patient during the encounter   Dione Booze, MD 01/08/12 0002

## 2012-01-07 NOTE — ED Notes (Addendum)
C/o sob, more so than usual, tiring out more easily, DOE and at rest. Ankles and feet swollen. Onset yesterday, here with family. H/o Hf and COPD, on O2 2L (HS & prn) at home. LS clear and diminished. Alert, NAD, calm, interactive, skin W&D, resps e/u, some increased wob noted, speaking in clear long phrases. Also reports cough, congestion cold sx, unsure of fever, (denies: nvd, bleeding), h/o mobitz 1 EKG.
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
## 2012-01-08 DIAGNOSIS — I059 Rheumatic mitral valve disease, unspecified: Secondary | ICD-10-CM

## 2012-01-08 LAB — GLUCOSE, CAPILLARY
Glucose-Capillary: 142 mg/dL — ABNORMAL HIGH (ref 70–99)
Glucose-Capillary: 170 mg/dL — ABNORMAL HIGH (ref 70–99)

## 2012-01-08 MED ORDER — HYDRALAZINE HCL 50 MG PO TABS
50.0000 mg | ORAL_TABLET | Freq: Three times a day (TID) | ORAL | Status: DC
  Administered 2012-01-08 – 2012-01-09 (×6): 50 mg via ORAL
  Filled 2012-01-08 (×10): qty 1

## 2012-01-08 MED ORDER — INSULIN DETEMIR 100 UNIT/ML ~~LOC~~ SOLN
100.0000 [IU] | Freq: Every day | SUBCUTANEOUS | Status: DC
  Administered 2012-01-08 – 2012-01-12 (×5): 100 [IU] via SUBCUTANEOUS
  Filled 2012-01-08: qty 10

## 2012-01-08 MED ORDER — ASPIRIN 325 MG PO TABS
325.0000 mg | ORAL_TABLET | Freq: Every day | ORAL | Status: DC
  Administered 2012-01-08 – 2012-01-12 (×5): 325 mg via ORAL
  Filled 2012-01-08 (×5): qty 1

## 2012-01-08 MED ORDER — ALLOPURINOL 100 MG PO TABS
100.0000 mg | ORAL_TABLET | Freq: Every day | ORAL | Status: DC
Start: 2012-01-08 — End: 2012-01-12
  Administered 2012-01-08 – 2012-01-11 (×4): 100 mg via ORAL
  Filled 2012-01-08 (×5): qty 1

## 2012-01-08 MED ORDER — NITROGLYCERIN 2 % TD OINT
1.0000 [in_us] | TOPICAL_OINTMENT | Freq: Once | TRANSDERMAL | Status: AC
  Administered 2012-01-08: 1 [in_us] via TOPICAL
  Filled 2012-01-08: qty 1

## 2012-01-08 MED ORDER — FUROSEMIDE 10 MG/ML IJ SOLN
80.0000 mg | Freq: Three times a day (TID) | INTRAMUSCULAR | Status: DC
  Administered 2012-01-08 – 2012-01-12 (×13): 80 mg via INTRAVENOUS
  Filled 2012-01-08 (×7): qty 8
  Filled 2012-01-08: qty 4
  Filled 2012-01-08 (×4): qty 8
  Filled 2012-01-08: qty 4
  Filled 2012-01-08 (×2): qty 8

## 2012-01-08 MED ORDER — AMIODARONE HCL 100 MG PO TABS
100.0000 mg | ORAL_TABLET | Freq: Every morning | ORAL | Status: DC
  Administered 2012-01-08 – 2012-01-12 (×5): 100 mg via ORAL
  Filled 2012-01-08 (×5): qty 1

## 2012-01-08 MED ORDER — FUROSEMIDE 10 MG/ML IJ SOLN
40.0000 mg | Freq: Two times a day (BID) | INTRAMUSCULAR | Status: DC
  Filled 2012-01-08 (×3): qty 4

## 2012-01-08 MED ORDER — SODIUM CHLORIDE 0.9 % IJ SOLN
3.0000 mL | Freq: Two times a day (BID) | INTRAMUSCULAR | Status: DC
  Administered 2012-01-08 – 2012-01-12 (×9): 3 mL via INTRAVENOUS

## 2012-01-08 MED ORDER — FUROSEMIDE 10 MG/ML IJ SOLN
40.0000 mg | Freq: Two times a day (BID) | INTRAMUSCULAR | Status: DC
  Administered 2012-01-08: 40 mg via INTRAVENOUS
  Filled 2012-01-08: qty 4

## 2012-01-08 MED ORDER — CLONIDINE HCL 0.1 MG/24HR TD PTWK: 0.1000 mg | MEDICATED_PATCH | TRANSDERMAL | Status: DC

## 2012-01-08 MED ORDER — SODIUM CHLORIDE 0.9 % IV SOLN: 250.0000 mL | INTRAVENOUS | Status: DC | PRN

## 2012-01-08 MED ORDER — PANTOPRAZOLE SODIUM 40 MG PO TBEC
40.0000 mg | DELAYED_RELEASE_TABLET | Freq: Every day | ORAL | Status: DC
  Administered 2012-01-08 – 2012-01-12 (×5): 40 mg via ORAL
  Filled 2012-01-08 (×5): qty 1

## 2012-01-08 MED ORDER — SODIUM CHLORIDE 0.9 % IJ SOLN: 3.0000 mL | INTRAMUSCULAR | Status: DC | PRN

## 2012-01-08 MED ORDER — LEVOTHYROXINE SODIUM 125 MCG PO TABS
125.0000 ug | ORAL_TABLET | Freq: Every day | ORAL | Status: DC
  Administered 2012-01-08 – 2012-01-12 (×5): 125 ug via ORAL
  Filled 2012-01-08 (×6): qty 1

## 2012-01-08 MED ORDER — ACETAMINOPHEN 325 MG PO TABS: 650.0000 mg | ORAL_TABLET | ORAL | Status: DC | PRN

## 2012-01-08 MED ORDER — ISOSORBIDE MONONITRATE ER 60 MG PO TB24
60.0000 mg | ORAL_TABLET | Freq: Every day | ORAL | Status: DC
  Administered 2012-01-08 – 2012-01-12 (×5): 60 mg via ORAL
  Filled 2012-01-08 (×6): qty 1

## 2012-01-08 MED ORDER — ATORVASTATIN CALCIUM 40 MG PO TABS
40.0000 mg | ORAL_TABLET | Freq: Every day | ORAL | Status: DC
  Administered 2012-01-08 – 2012-01-11 (×3): 40 mg via ORAL
  Filled 2012-01-08 (×5): qty 1

## 2012-01-08 MED ORDER — FUROSEMIDE 10 MG/ML IJ SOLN: 80.0000 mg | Freq: Three times a day (TID) | INTRAMUSCULAR | Status: DC

## 2012-01-08 MED ORDER — INSULIN ASPART 100 UNIT/ML ~~LOC~~ SOLN
0.0000 [IU] | Freq: Three times a day (TID) | SUBCUTANEOUS | Status: DC
  Administered 2012-01-08 – 2012-01-09 (×2): 3 [IU] via SUBCUTANEOUS
  Administered 2012-01-09: 2 [IU] via SUBCUTANEOUS
  Administered 2012-01-10: 1 [IU] via SUBCUTANEOUS
  Administered 2012-01-10: 3 [IU] via SUBCUTANEOUS
  Administered 2012-01-10 – 2012-01-11 (×2): 2 [IU] via SUBCUTANEOUS
  Administered 2012-01-11 – 2012-01-12 (×2): 3 [IU] via SUBCUTANEOUS
  Administered 2012-01-12: 2 [IU] via SUBCUTANEOUS

## 2012-01-08 MED ORDER — CITALOPRAM HYDROBROMIDE 20 MG PO TABS
20.0000 mg | ORAL_TABLET | Freq: Every morning | ORAL | Status: DC
  Administered 2012-01-08 – 2012-01-12 (×5): 20 mg via ORAL
  Filled 2012-01-08 (×5): qty 1

## 2012-01-08 MED ORDER — HEPARIN SODIUM (PORCINE) 5000 UNIT/ML IJ SOLN
5000.0000 [IU] | Freq: Three times a day (TID) | INTRAMUSCULAR | Status: DC
  Administered 2012-01-08 – 2012-01-12 (×13): 5000 [IU] via SUBCUTANEOUS
  Filled 2012-01-08 (×16): qty 1

## 2012-01-08 MED ORDER — AMLODIPINE BESYLATE 10 MG PO TABS
10.0000 mg | ORAL_TABLET | Freq: Every day | ORAL | Status: DC
  Administered 2012-01-08 – 2012-01-12 (×5): 10 mg via ORAL
  Filled 2012-01-08 (×6): qty 1

## 2012-01-08 NOTE — H&P (Signed)
History and Physical  Patient ID: BRAIAN TIJERINA MRN: [id number], SOB: [birth year] 78 y.o. Date of Encounter: 01/08/2012, 12:03 AM  Primary Physician: Oliver Barre, MD Primary Cardiologist: Dr. Gala Romney  Chief Complaint: DOE and LE swelling  HPI: 78 y.o. male w/ PMHx significant for HFpEF, h/o Parox afib, h/o stroke, nonobs CAD who presented to [facility] on 01/08/2012 with complaints of progressive dyspnea and lower extremity swelling. He was last seen by cardiology almost 11 months ago at which time he reports doing well. Has not followed up with cardiology because he was doing so well he reports. However, over the last 2-3 weeks, he recalls increasing shortness of breath with exertion and worsening LE swelling. He admits that he no longer weighs himself and he does not wear LE compression hose any more. He reports compliance with his medications (pillbox filled by his family). He lives in an apartment alone. He also readily admits without prompting that he has been eating canned foods, prepared foods and salty meals including pork products.  No chest pain. No syncope. No bleeding. No fevers. Minimal exertion causes SOB. His family notes occasional slurring of his words though this seems to be episodic.  Given 40 mg IV lasix in the ER.  EKG revealed 1st deg AVB with wenkebach. Flattened TWaves laterally, unchanged from 12/2010. CXR with evidence of fluid overload. Labs are significant for elevated Cr (chronic), Sig elevated BNP.   Past Medical History  Diagnosis Date  . Chronic diastolic heart failure     secondary to diastolic dysfunction EF (previous EF 35-45%)ef 60%4/09  . Arrhythmia     atrial fibrillation /pt on amiodarone,thought to be poor coumadin  . Stroke   . Other and unspecified hyperlipidemia   . Hypertension   . Hypertrophy of prostate without urinary obstruction and other lower urinary tract symptoms (LUTS)   . Osteoarthrosis, unspecified whether generalized or  localized, unspecified site   . Type II or unspecified type diabetes mellitus without mention of complication, not stated as uncontrolled   . AV block, Mobitz 1     Intolerance to ACE's / discontiuation of beta blockers  . Obstructive sleep apnea   . Iron deficiency anemia, unspecified     s/p EGD and coloscopy 3/10.gastritis.hemorrhids  . Cerebrovascular accident   . Renal insufficiency   . Obesity   . Allergic rhinitis, cause unspecified 05/29/2010  . Prostate cancer   . CAD (coronary artery disease)     cath 12/04: mLAD 50-60%, mCFX 20-30%, pRCA 50-60%, mRCA 40%     Surgical History:  Past Surgical History  Procedure Date  . Prostate surgery   . Prostate surgury     hx of     Home Meds: Prior to Admission medications   Medication Sig Start Date End Date Taking? Authorizing Provider  allopurinol (ZYLOPRIM) 100 MG tablet Take 100 mg by mouth at bedtime.   Yes Historical Provider, MD  amiodarone (PACERONE) 200 MG tablet Take 100 mg by mouth every morning.   Yes Historical Provider, MD  amLODipine (NORVASC) 10 MG tablet Take 10 mg by mouth daily.   Yes Historical Provider, MD  aspirin 325 MG tablet Take 1 tablet (325 mg total) by mouth daily. 11/13/10  Yes Dolores Patty, MD  atorvastatin (LIPITOR) 40 MG tablet Take 1 tablet (40 mg total) by mouth daily. 12/03/11  Yes [name] Bensimhon, MD  citalopram (CELEXA) 10 MG tablet Take 20 mg by mouth every morning.   Yes Historical Provider, MD  cloNIDine (CATAPRES - DOSED IN MG/24 HR) 0.1 mg/24hr patch Place 1 patch onto the skin once a week. Change on sundays 10/24/11  Yes Dolores Patty, MD  Fe Fum-Vit C-Vit B12-FA (TRIGELS-F) 460-60-0.01-1 MG CAPS Take 1 capsule by mouth daily. 12/24/11  Yes Dolores Patty, MD  hydrALAZINE (APRESOLINE) 50 MG tablet Take 50 mg by mouth 3 (three) times daily.   Yes Historical Provider, MD  insulin detemir (LEVEMIR) 100 UNIT/ML injection Inject 100 Units into the skin daily. 03/07/11  Yes Corwin Levins, MD  isosorbide mononitrate (IMDUR) 60 MG 24 hr tablet Take 60 mg by mouth daily.   Yes Historical Provider, MD  levothyroxine (SYNTHROID, LEVOTHROID) 125 MCG tablet Take 125 mcg by mouth daily.   Yes Historical Provider, MD  omeprazole (PRILOSEC) 20 MG capsule Take 20 mg by mouth daily.   Yes Historical Provider, MD  torsemide (DEMADEX) 20 MG tablet Take 40 mg by mouth 2 (two) times daily.    Yes Historical Provider, MD    Allergies:  Allergies  Allergen Reactions  . Ace Inhibitors Other (See Comments)    me  hyposion  . Beta Adrenergic Blockers Other (See Comments)     use cautiously secondary to 2nd degree heart block, hypotension    History   Social History  . Marital Status: Divorced    Spouse Name: N/A    Number of Children: N/A  . Years of Education: N/A   Occupational History  . Not on file.   Social History Main Topics  . Smoking status: Former Games developer  . Smokeless tobacco: Not on file  . Alcohol Use: No     Comment: history of abuse  . Drug Use:   . Sexually Active:    Other Topics Concern  . Not on file   Social History Narrative   Regular exercise - no ambulates w/cane or walker     Family History  Problem Relation Age of Onset  . Heart disease Father     Review of Systems: General: negative for chills, fever, night sweats or weight changes.  Cardiovascular: as per HPI.  Dermatological: negative for rash Respiratory: negative for cough. As per HPI. Urologic: negative for hematuria Abdominal: negative for nausea, vomiting, diarrhea, bright red blood per rectum, melena, or hematemesis Neurologic: negative for visual changes, syncope, or dizziness All other systems reviewed and are otherwise negative except as noted above.  Labs:   Lab Results  Component Value Date   WBC 8.8 01/07/2012   HGB 9.7* 01/07/2012   HCT 30.7* 01/07/2012   MCV 85.0 01/07/2012   PLT 303 01/07/2012    Lab 01/07/12 2009  NA 141  K 4.2  CL 105  CO2 27  BUN 34*    CREATININE 1.92*  CALCIUM 8.8  PROT 6.9  BILITOT 0.3  ALKPHOS 70  ALT 11  AST 15  GLUCOSE 124*   No results found for this basename: CKTOTAL:4,CKMB:4,TROPONINI:4 in the last 72 hours Lab Results  Component Value Date   CHOL 148 09/22/2010   HDL 42 09/22/2010   LDLCALC 79 09/22/2010   TRIG 137 09/22/2010   Lab Results  Component Value Date   DDIMER 3.71* 09/22/2010    Radiology/Studies:  Dg Chest 2 View  01/07/2012  *RADIOLOGY REPORT*  Clinical Data: Shortness of breath  CHEST - 2 VIEW  Comparison: 01/15/2011  Findings: Cardiomegaly.  Central vascular congestion.  Interstitial prominence and emphysematous changes.  There are right greater than left perihilar/infrahilar opacities.  Small right pleural effusion. No pneumothorax.  IMPRESSION: Cardiomegaly with central vascular congestion. Perihilar/infrahilar opacities may reflect edema or infection. Small right pleural effusion.  Background interstitial prominence / emphysematous changes.   Original Report Authenticated By: Jearld Lesch, M.D.      EKG: 1st deg AVB with wenkebach. Flattened TWaves laterally, unchanged from 12/2010  Physical Exam: Blood pressure 174/114, pulse 68, temperature 97.4 F (36.3 C), temperature source Oral, resp. rate 15, SpO2 100.00%. General: Well developed, gets dyspneic with minimal effort Head: Normocephalic, atraumatic, sclera non-icteric, nares are without discharge Neck: Supple. Negative for carotid bruits. JVD at jawline at 30 deg. Lungs: crackles 1/2 way up Heart: RRR with S1 S2. No murmurs, rubs, or gallops appreciated. Abdomen: soft yet very prominent abdomen (pt reports normal) Msk:  Strength and tone appear normal for age. Extremities: warm and dry. +3 pitting edema to mid thigh. Clonidine patch on L shoulder Neuro: Alert and oriented X 3. Moves all extremities spontaneously.  Psych:  Responds to questions appropriately with a normal affect.   Problem List 1. Acute decompensated heart  failure with history of HFpEF, last EF > 60% 2. H/o afib, now 1st deg avb with wenkebach, on amiodarone 3. CKD, baseline Cr ~1.8 4. H/o stroke, not anticoagulated due to ETOH history 5. Significant HTN 6. Type II diabetes 7. Nonobs CAD 8. OSA, on 02 at night  ASSESSMENT AND PLAN:  78 y.o. male w/ PMHx significant for HFpEF, h/o Parox afib, h/o stroke, nonobs CAD who presented to [facility] on 01/08/2012 with complaints of progressive dyspnea and lower extremity swelling --> in acute decompensated heart failure, likely precipitated by dietary noncompliance.  Needs inpatient diuresis with IV lasix. Appears to be responding to dose provided in the ER. No recent echo, will repeat to assess EF.  Regimen is nitrates and hydralazine due to intolerance of ACEIs (per chart) and BB contraindicated due to prolonged PR and wenckebach. Continue anti-hypertensives.  Continue diabetic regimen.   Renally dose medications.  H.o of PAF- continue low dose amiodarone. 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 Consider re-addressing.  H/o non-obs CAD. No BB or ACE due to reasons described above. Continue statin.  Full code per discussion Prophylaxis: subq heparin, PPI  Signed, Hasan Douse C. MD 01/08/2012, 12:03 AM

## 2012-01-08 NOTE — Progress Notes (Signed)
Advanced Heart Failure Rounding Note   Subjective:   78 y.o. male w/ PMHx significant for HFpEF, DM, h/o Parox afib ( not on anticoagulant due to ETOH), h/o stroke, nonobs CAD.  He is not on ACEIs (per chart) and BB contraindicated due to prolonged PR and wenckebach.   He has been followed on the heart failure clinic and he was last seen 01/2011. Weight at that time was 223 pounds. Home diuretic regimen Demadex 40 mg bid.   He lives alone. Ambulates with a walker. His daughter prepares his pill box and meals. He says that he stopped weighing ans has been eating high salt foods. Uses oxygen at night. Denies alcohol use. Speech slurred at baseline.    He presented to [facility] on 01/08/2012 with complaints of progressive dyspnea and lower extremity swelling . Admit weight 226 pounds. Pertinent admission labs: Pro BNP 1845, Potassium 4.2, Creatinine 1.92.  He has received 40 mg IV lasix x 2  With 650 cc urine output.   Remains SOB at rest. + Orthopnea.      Objective:   Weight Range:  Vital Signs:   Temp:  [97.4 F (36.3 C)-98.3 F (36.8 C)] 97.6 F (36.4 C) (11/19 0724) Pulse Rate:  [57-81] 78  (11/19 0724) Resp:  [14-22] 22  (11/19 0724) BP: (151-186)/(65-114) 186/88 mmHg (11/19 0724) SpO2:  [88 %-100 %] 98 % (11/19 0724) Weight:  [226 lb 9.6 oz (102.785 kg)] 226 lb 9.6 oz (102.785 kg) (11/19 0331) Last BM Date: 01/07/12  Weight change: Filed Weights   01/08/12 0331  Weight: 226 lb 9.6 oz (102.785 kg)    Intake/Output:   Intake/Output Summary (Last 24 hours) at 01/08/12 0832 Last data filed at 01/08/12 0248  Gross per 24 hour  Intake      0 ml  Output    550 ml  Net   -550 ml     Physical Exam: General:  Well appearing. No resp difficulty HEENT: normal Neck: supple. JVP to jaw. Carotids 2+ bilat; no bruits. No lymphadenopathy or thryomegaly appreciated. Cor: PMI nondisplaced. Regular rate & rhythm. No rubs, gallops or murmurs. Lungs: clear Abdomen:  soft, nontender, distended. No hepatosplenomegaly. No bruits or masses. Good bowel sounds. Extremities: no cyanosis, clubbing, rash, R and LLE 3+ edema Neuro: alert & orientedx3, cranial nerves grossly intact. moves all 4 extremities w/o difficulty. Affect pleasant Speech slurred   Telemetry:  Labs: Basic Metabolic Panel:  Lab 01/07/12 1610  NA 141  K 4.2  CL 105  CO2 27  GLUCOSE 124*  BUN 34*  CREATININE 1.92*  CALCIUM 8.8  MG --  PHOS --    Liver Function Tests:  Lab 01/07/12 2009  AST 15  ALT 11  ALKPHOS 70  BILITOT 0.3  PROT 6.9  ALBUMIN 3.2*   No results found for this basename: LIPASE:5,AMYLASE:5 in the last 168 hours No results found for this basename: AMMONIA:3 in the last 168 hours  CBC:  Lab 01/07/12 2009  WBC 8.8  NEUTROABS 7.0  HGB 9.7*  HCT 30.7*  MCV 85.0  PLT 303    Cardiac Enzymes: No results found for this basename: CKTOTAL:5,CKMB:5,CKMBINDEX:5,TROPONINI:5 in the last 168 hours  BNP: BNP (last 3 results)  Basename 01/07/12 2009 01/11/11 1752  PROBNP 1845.0* 5.4     Other results:  EKG:   Imaging: Dg Chest 2 View  01/07/2012  *RADIOLOGY REPORT*  Clinical Data: Shortness of breath  CHEST - 2 VIEW  Comparison: 01/15/2011  Findings:  Cardiomegaly.  Central vascular congestion.  Interstitial prominence and emphysematous changes.  There are right greater than left perihilar/infrahilar opacities.  Small right pleural effusion. No pneumothorax.  IMPRESSION: Cardiomegaly with central vascular congestion. Perihilar/infrahilar opacities may reflect edema or infection. Small right pleural effusion.  Background interstitial prominence / emphysematous changes.   Original Report Authenticated By: Jearld Lesch, M.D.       Medications:     Scheduled Medications:    . allopurinol  100 mg Oral QHS  . amiodarone  100 mg Oral q morning - 10a  . amLODipine  10 mg Oral Daily  . aspirin  325 mg Oral Daily  . atorvastatin  40 mg Oral q1800  .  citalopram  20 mg Oral q morning - 10a  . cloNIDine  0.1 mg Transdermal Weekly  . [COMPLETED] furosemide  40 mg Intravenous Once  . furosemide  40 mg Intravenous BID  . heparin  5,000 Units Subcutaneous Q8H  . hydrALAZINE  50 mg Oral TID  . insulin aspart  0-15 Units Subcutaneous TID WC  . insulin detemir  100 Units Subcutaneous Daily  . isosorbide mononitrate  60 mg Oral Daily  . levothyroxine  125 mcg Oral QAC breakfast  . [COMPLETED] nitroGLYCERIN  1 inch Topical Once  . pantoprazole  40 mg Oral Daily  . sodium chloride  3 mL Intravenous Q12H  . [DISCONTINUED] furosemide  40 mg Intravenous Q12H     Infusions:     PRN Medications:  sodium chloride, acetaminophen, sodium chloride   Assessment:  1. A/C Diastolic Heart Failure  (EF 60-65% 2011) 2. A- fib 3.  History of CVA 4. DM II 5. HTN 6. A/C renal failure ( creatinine baseline 1.7-1.9) 7. Noncompliance - (no follow up since 01/2011) 8. Hypothyroidism   Plan/Discussion:    NYHA IV with dyspnea at rest. Volume status marked elevation noted in setting of dietary noncompliance.   Increase Lasix 80 mg TID. If SBP remains > 140 will increase hydralazine to 75 mg tid. Watch renal function closely. ECHO pending. Add ted hose.   Rate controlled. Continue Aspirin 325 mg daily. Due to noncompliance will not add anticoagulant.   Check TSH  Consult PT tomorrow after volume status improves. Consult case manager for home health once discharged.    Length of Stay: 1   CLEGG,AMY 01/08/2012, 8:32 AM   Patient seen and examined with Tonye Becket, NP. We discussed all aspects of the encounter. I agree with the assessment and plan as stated above.   He has significant volume overload in the setting of dietary indiscretion. Bedside echo reviewed. Normal LV and RV function. + LVH. Agree with IV diuresis.   Daniel Bensimhon,MD 2:29 PM

## 2012-01-08 NOTE — Care Management Note (Unsigned)
    Page 1 of 2   01/08/2012     2:08:24 PM   CARE MANAGEMENT NOTE 01/08/2012  Patient:  [name], [name]   Account Number:  [id number]  Date Initiated:  01/08/2012  Documentation initiated by:  Lehi Phifer  Subjective/Objective Assessment:   PT ADM ON 01/07/12 WITH CHF EXACERBATION.  PTA, PT LIVES ALONE, BUT STATES HAS HELP FROM DAUGHTERS WHO LIVE IN THE AREA.  HE IS ON CHRONIC OXYGEN AT HOME, PROVIDER IS [facility].  HE HAS A RW.     Action/Plan:   MET WITH PT TO DISCUSS DC PLANS.  PT REQUESTS HOME HEALTH FOLLOW UP AT DC.  PT HAS USED [facility] IN THE PAST AND WISHES TO USE AGAIN.  P.T. CONSULT PENDING, THOUGH PT WILL LIKELY NEED HHPT AS WELL AS HHRN FOR CHF FOLLOW UP.   Anticipated DC Date:  01/10/2012   Anticipated DC Plan:  HOME W HOME HEALTH SERVICES      DC Planning Services  CM consult      [facility] Choice  HOME HEALTH   Choice offered to / List presented to:  C-1 Patient        HH arranged  HH-1 RN  HH-10 DISEASE MANAGEMENT  HH-2 PT      HH agency  [facility]   Status of service:  In process, will continue to follow Medicare Important Message given?   (If response is "NO", the following Medicare IM given date fields will be blank) Date Medicare IM given:   Date Additional Medicare IM given:    Discharge Disposition:  HOME W HOME HEALTH SERVICES  Per UR Regulation:  Reviewed for med. necessity/level of care/duration of stay  If discussed at Long Length of Stay Meetings, dates discussed:    Comments:  01/08/12 Jarely Juncaj,RN,BSN [phone] REFERRAL TO [facility] FOR HH FOLLOW UP.  START OF CARE 24-48H POST DC DATE.  AWAIT THERAPY RECOMMENDATIONS.  WILL FOLLOW.

## 2012-01-08 NOTE — Progress Notes (Signed)
  Echocardiogram 2D Echocardiogram has been performed.  [name] 01/08/2012, 4:51 PM

## 2012-01-09 ENCOUNTER — Telehealth: Payer: Self-pay | Admitting: Internal Medicine

## 2012-01-09 LAB — BASIC METABOLIC PANEL
BUN: 32 mg/dL — ABNORMAL HIGH (ref 6–23)
CO2: 27 mEq/L (ref 19–32)
Calcium: 8.8 mg/dL (ref 8.4–10.5)
Chloride: 107 mEq/L (ref 96–112)
Creatinine, Ser: 1.96 mg/dL — ABNORMAL HIGH (ref 0.50–1.35)

## 2012-01-09 LAB — GLUCOSE, CAPILLARY
Glucose-Capillary: 100 mg/dL — ABNORMAL HIGH (ref 70–99)
Glucose-Capillary: 139 mg/dL — ABNORMAL HIGH (ref 70–99)
Glucose-Capillary: 157 mg/dL — ABNORMAL HIGH (ref 70–99)
Glucose-Capillary: 144 mg/dL — ABNORMAL HIGH (ref 70–99)

## 2012-01-09 MED ORDER — POTASSIUM CHLORIDE CRYS ER 20 MEQ PO TBCR
40.0000 meq | EXTENDED_RELEASE_TABLET | Freq: Two times a day (BID) | ORAL | Status: DC
  Administered 2012-01-09 – 2012-01-12 (×7): 40 meq via ORAL
  Filled 2012-01-09 (×8): qty 2

## 2012-01-09 NOTE — Progress Notes (Signed)
Verified with Nurse Tech that pt was weighed on standup scales this am [name]

## 2012-01-09 NOTE — Telephone Encounter (Signed)
Patients son informed of MD instructions.

## 2012-01-09 NOTE — Telephone Encounter (Signed)
The patient's son wants him to come in Monday to get his toe nails cut.  Do we do this here?  The son's callback is Gerlene Burdock, son) [phone].  If this service is not available here, please advise where he can get this done.   Thanks!

## 2012-01-09 NOTE — Progress Notes (Signed)
Advanced Heart Failure Rounding Note   Subjective:   78 y.o. male w/ PMHx significant for HFpEF, DM, h/o Parox afib ( not on anticoagulant due to ETOH), h/o stroke, nonobs CAD.  He is not on ACEIs (per chart) and BB contraindicated due to prolonged PR and wenckebach.   He has been followed on the heart failure clinic and he was last seen 01/2011. Weight at that time was 223 pounds. Home diuretic regimen Demadex 40 mg bid.   He lives alone. Ambulates with a walker. His daughter prepares his pill box and meals. He says that he stopped weighing ans has been eating high salt foods. Uses oxygen at night. Denies alcohol use. Speech slurred at baseline.    He presented to [facility] on 01/08/2012 with complaints of progressive dyspnea and lower extremity swelling . Admit weight 226 pounds. Pertinent admission labs: Pro BNP 1845, Potassium 4.2, Creatinine 1.92.    Yesterday started on Lasix 80 mg IV tid.   24 hour I/O - 2.5 liters.   Breathing better. +Orthopnea. Denies PND.        Objective:   Weight Range:  Vital Signs:   Temp:  [97.6 F (36.4 C)-99.2 F (37.3 C)] 97.9 F (36.6 C) (11/20 0415) Pulse Rate:  [59-79] 79  (11/20 0415) Resp:  [18-22] 18  (11/20 0415) BP: (144-186)/(62-88) 144/62 mmHg (11/20 0415) SpO2:  [93 %-98 %] 93 % (11/20 0415) Last BM Date: 01/09/12  Weight change: Filed Weights   01/08/12 0331  Weight: 226 lb 9.6 oz (102.785 kg)    Intake/Output:   Intake/Output Summary (Last 24 hours) at 01/09/12 0701 Last data filed at 01/09/12 0300  Gross per 24 hour  Intake    480 ml  Output   3000 ml  Net  -2520 ml     Physical Exam: General:  Well appearing. No resp difficulty HEENT: normal Neck: supple. JVP to 9-10. Carotids 2+ bilat; no bruits. No lymphadenopathy or thryomegaly appreciated. Cor: PMI nondisplaced. Regular rate & rhythm. No rubs, gallops or murmurs. Lungs: clear Abdomen: soft, nontender, distended. No hepatosplenomegaly. No bruits  or masses. Good bowel sounds. Extremities: no cyanosis, clubbing, rash, R and LLE 2+ edema bilateral teds  Neuro: alert & orientedx3, cranial nerves grossly intact. moves all 4 extremities w/o difficulty. Affect pleasant Speech slurred   Telemetry:  Labs: Basic Metabolic Panel:  Lab 01/09/12 4098 01/07/12 2009  NA 142 141  K 3.3* 4.2  CL 107 105  CO2 27 27  GLUCOSE 110* 124*  BUN 32* 34*  CREATININE 1.96* 1.92*  CALCIUM 8.8 8.8  MG -- --  PHOS -- --    Liver Function Tests:  Lab 01/07/12 2009  AST 15  ALT 11  ALKPHOS 70  BILITOT 0.3  PROT 6.9  ALBUMIN 3.2*   No results found for this basename: LIPASE:5,AMYLASE:5 in the last 168 hours No results found for this basename: AMMONIA:3 in the last 168 hours  CBC:  Lab 01/07/12 2009  WBC 8.8  NEUTROABS 7.0  HGB 9.7*  HCT 30.7*  MCV 85.0  PLT 303    Cardiac Enzymes: No results found for this basename: CKTOTAL:5,CKMB:5,CKMBINDEX:5,TROPONINI:5 in the last 168 hours  BNP: BNP (last 3 results)  Basename 01/07/12 2009 01/11/11 1752  PROBNP 1845.0* 5.4     Other results:  EKG:   Imaging: Dg Chest 2 View  01/07/2012  *RADIOLOGY REPORT*  Clinical Data: Shortness of breath  CHEST - 2 VIEW  Comparison: 01/15/2011  Findings: Cardiomegaly.  Central vascular congestion.  Interstitial prominence and emphysematous changes.  There are right greater than left perihilar/infrahilar opacities.  Small right pleural effusion. No pneumothorax.  IMPRESSION: Cardiomegaly with central vascular congestion. Perihilar/infrahilar opacities may reflect edema or infection. Small right pleural effusion.  Background interstitial prominence / emphysematous changes.   Original Report Authenticated By: Jearld Lesch, M.D.      Medications:     Scheduled Medications:    . allopurinol  100 mg Oral QHS  . amiodarone  100 mg Oral q morning - 10a  . amLODipine  10 mg Oral Daily  . aspirin  325 mg Oral Daily  . atorvastatin  40 mg Oral  q1800  . citalopram  20 mg Oral q morning - 10a  . cloNIDine  0.1 mg Transdermal Weekly  . furosemide  80 mg Intravenous TID  . heparin  5,000 Units Subcutaneous Q8H  . hydrALAZINE  50 mg Oral TID  . insulin aspart  0-15 Units Subcutaneous TID WC  . insulin detemir  100 Units Subcutaneous Daily  . isosorbide mononitrate  60 mg Oral Daily  . levothyroxine  125 mcg Oral QAC breakfast  . pantoprazole  40 mg Oral Daily  . sodium chloride  3 mL Intravenous Q12H  . [DISCONTINUED] furosemide  40 mg Intravenous Q12H  . [DISCONTINUED] furosemide  40 mg Intravenous BID  . [DISCONTINUED] furosemide  80 mg Intravenous TID    Infusions:    PRN Medications: sodium chloride, acetaminophen, sodium chloride   Assessment:  1. A/C Diastolic Heart Failure  (EF 60-65% 2011) 2. A- fib 3.  History of CVA 4. DM II 5. HTN 6. A/C renal failure ( creatinine baseline 1.7-1.9) 7. Noncompliance - (no follow up since 01/2011) 8. Hypothyroidism 9. Hypokalemia  Plan/Discussion:    Volume status improving. Continue current diuretic regimen. Weight pending. SBP improved. Renal function stable.    Rate controlled. Continue Aspirin 325 mg daily. Due to noncompliance will not add anticoagulant.   Consult PT.   Dispostion: Case manager following for home health once discharged.    Length of Stay: 2   CLEGG,AMY 01/09/2012, 7:01 AM  Patient seen and examined with Tonye Becket, NP. We discussed all aspects of the encounter. I agree with the assessment and plan as stated above.   Volume status improving but weight still up at least 5 pounds. Renal function stable. Continue diuresis. K+ supped. Ambulate with PT. Hopefully home in 24-48 hours.  Bently Wyss,MD 9:51 AM

## 2012-01-09 NOTE — Evaluation (Signed)
Physical Therapy Evaluation Patient Details Name: [name] MRN: [id number] DOB: 07-Nov-1932 Today's Date: 01/09/2012 Time: 9147-8295 PT Time Calculation (min): 20 min  PT Assessment / Plan / Recommendation Clinical Impression  Pt s/p CHF exacerbation with decr mobility secondary to balance and endurance deficits.  Pt states he is at baseline even though he demonstrates some unsafe balance with RW.  He states he uses his electric wheelchair in the home most often.  Recommend HHPT f/u.      PT Assessment  Patient needs continued PT services    Follow Up Recommendations  Home health PT    Does the patient have the potential to tolerate intense rehabilitation      Barriers to Discharge Decreased caregiver support      Equipment Recommendations  None recommended by PT    Recommendations for Other Services     Frequency Min 3X/week    Precautions / Restrictions Precautions Precautions: Fall Restrictions Weight Bearing Restrictions: No   Pertinent Vitals/Pain VSS, No pain      Mobility  Bed Mobility Bed Mobility: Rolling Left;Left Sidelying to Sit Rolling Left: 5: Supervision Left Sidelying to Sit: 5: Supervision;HOB elevated Transfers Transfers: Sit to Stand;Stand to Sit Sit to Stand: 5: Supervision;From bed Stand to Sit: 5: Supervision;With upper extremity assist;To chair/3-in-1 Details for Transfer Assistance: Pt needed cues for hand placement.   Ambulation/Gait Ambulation/Gait Assistance: 4: Min guard Ambulation Distance (Feet): 130 Feet Assistive device: Rolling walker Ambulation/Gait Assistance Details: Pt with unsafe gait with RW losing balance with turns secondary to patient slightly impulsive and turning too quickly.  Pt needed cues for safety with RW use in general.  Pt with excessive heel strike on right LE with a shortened step length on left thus appearing to cause decr stabiltiy of posture.   Gait Pattern: Step-through pattern;Decreased stride  length;Decreased hip/knee flexion - right;Decreased dorsiflexion - right Gait velocity: decreased Stairs: No Wheelchair Mobility Wheelchair Mobility: No              PT Diagnosis: Generalized weakness  PT Problem List: Decreased balance;Decreased mobility;Decreased activity tolerance;Decreased safety awareness;Decreased knowledge of use of DME PT Treatment Interventions: DME instruction;Gait training;Functional mobility training;Therapeutic activities;Therapeutic exercise;Balance training;Patient/family education   PT Goals Acute Rehab PT Goals PT Goal Formulation: With patient Time For Goal Achievement: 01/16/12 Potential to Achieve Goals: Good Pt will go Supine/Side to Sit: Independently PT Goal: Supine/Side to Sit - Progress: Goal set today Pt will go Sit to Stand: Independently PT Goal: Sit to Stand - Progress: Goal set today Pt will Ambulate: 51 - 150 feet;with modified independence;with least restrictive assistive device PT Goal: Ambulate - Progress: Goal set today  Visit Information  Last PT Received On: 01/09/12 Assistance Needed: +1    Subjective Data  Subjective: "I couldn't even walk to the door when I came in." Patient Stated Goal: To go home.   Prior Functioning  Home Living Lives With: Alone Available Help at Discharge: Family;Available PRN/intermittently Type of Home: House Home Access: Ramped entrance Home Layout: One level Bathroom Shower/Tub: Engineer, manufacturing systems: Standard Bathroom Accessibility: Yes How Accessible: Accessible via walker Home Adaptive Equipment: Hand-held shower hose;Tub transfer bench;Grab bars in shower;Bedside commode/3-in-1;Walker - four wheeled;Straight cane;Quad cane;Wheelchair - powered Prior Function Level of Independence: Independent with assistive device(s) Able to Take Stairs?: No Driving: No Vocation: Retired Musician: No difficulties Dominant Hand: Right    Cognition  Overall  Cognitive Status: Appears within functional limits for tasks assessed/performed Arousal/Alertness: Awake/alert Orientation Level: Disoriented  to;Time Behavior During Session: [facility] for tasks performed    Extremity/Trunk Assessment Right Lower Extremity Assessment RLE ROM/Strength/Tone: [facility] for tasks assessed Left Lower Extremity Assessment LLE ROM/Strength/Tone: [facility] for tasks assessed Trunk Assessment Trunk Assessment: Normal   Balance Static Standing Balance Static Standing - Balance Support: Bilateral upper extremity supported;During functional activity Static Standing - Level of Assistance: 5: Stand by assistance Static Standing - Comment/# of Minutes: 2 minutes with supervision.    End of Session PT - End of Session Equipment Utilized During Treatment: Gait belt Activity Tolerance: Patient limited by fatigue Patient left: in chair;with call bell/phone within reach;with bed alarm set Nurse Communication: Mobility status  GP Functional Assessment Tool Used: Clinical judgment Functional Limitation: Mobility: Walking and moving around Mobility: Walking and Moving Around Current Status ([id number]): At least 1 percent but less than 20 percent impaired, limited or restricted Mobility: Walking and Moving Around Goal Status [phone]): 0 percent impaired, limited or restricted   INGOLD,Shayden Bobier 01/09/2012, 2:00 PM  [facility] Acute Rehabilitation [phone] (pager)

## 2012-01-09 NOTE — Telephone Encounter (Signed)
Sorry, we do not  But I can refer to podiatry (or he can self -refer, such as to [facility])

## 2012-01-10 ENCOUNTER — Other Ambulatory Visit: Payer: Self-pay | Admitting: Internal Medicine

## 2012-01-10 LAB — BASIC METABOLIC PANEL
Calcium: 8.8 mg/dL (ref 8.4–10.5)
GFR calc Af Amer: 41 mL/min — ABNORMAL LOW (ref >90)
GFR calc non Af Amer: 35 mL/min — ABNORMAL LOW (ref >90)
Sodium: 141 mEq/L (ref 135–145)

## 2012-01-10 LAB — GLUCOSE, CAPILLARY

## 2012-01-10 MED ORDER — HYDRALAZINE HCL 50 MG PO TABS
75.0000 mg | ORAL_TABLET | Freq: Three times a day (TID) | ORAL | Status: DC
  Administered 2012-01-10 (×3): 75 mg via ORAL
  Filled 2012-01-10 (×6): qty 1

## 2012-01-10 MED ORDER — HYDRALAZINE HCL 20 MG/ML IJ SOLN
10.0000 mg | Freq: Four times a day (QID) | INTRAMUSCULAR | Status: DC | PRN
  Administered 2012-01-10: 10 mg via INTRAVENOUS
  Filled 2012-01-10: qty 0.5

## 2012-01-10 MED ORDER — METOLAZONE 2.5 MG PO TABS
2.5000 mg | ORAL_TABLET | Freq: Once | ORAL | Status: AC
  Administered 2012-01-10: 2.5 mg via ORAL
  Filled 2012-01-10: qty 1

## 2012-01-10 MED ORDER — POTASSIUM CHLORIDE CRYS ER 20 MEQ PO TBCR
40.0000 meq | EXTENDED_RELEASE_TABLET | Freq: Once | ORAL | Status: AC
  Administered 2012-01-10: 40 meq via ORAL
  Filled 2012-01-10: qty 2

## 2012-01-10 NOTE — Progress Notes (Signed)
Advanced Heart Failure Rounding Note   Subjective:   78 y.o. male w/ PMHx significant for HFpEF, DM, h/o Parox afib ( not on anticoagulant due to ETOH), h/o stroke, nonobs CAD.  He is not on ACEIs (per chart) and BB contraindicated due to prolonged PR and wenckebach.   He has been followed on the heart failure clinic and he was last seen 01/2011. Weight at that time was 223 pounds. Home diuretic regimen Demadex 40 mg bid.   He lives alone. Ambulates with a walker. His daughter prepares his pill box and meals. He says that he stopped weighing ans has been eating high salt foods. Uses oxygen at night. Denies alcohol use. Speech slurred at baseline.    He presented to [facility] on 01/08/2012 with complaints of progressive dyspnea and lower extremity swelling . Admit weight 226 pounds. Pertinent admission labs: Pro BNP 1845, Potassium 4.2, Creatinine 1.92.    He continued on Lasix 80 mg IV tid.   24 hour I/O not accurate due to incontinence. BMET pending. Weight on standing scale 232 pounds.  Feels better. +Orthopnea. Denies PND.        Objective:   Weight Range:  Vital Signs:   Temp:  [98 F (36.7 C)-98.8 F (37.1 C)] 98.1 F (36.7 C) (11/21 0411) Pulse Rate:  [64-75] 75  (11/21 0438) Resp:  [17-18] 18  (11/21 0411) BP: (138-190)/(65-85) 179/85 mmHg (11/21 0438) SpO2:  [94 %-98 %] 98 % (11/21 0411) Weight:  [234 lb 9.1 oz (106.4 kg)] 234 lb 9.1 oz (106.4 kg) (11/21 0411) Last BM Date: 01/09/12  Weight change: Filed Weights   01/08/12 0331 01/09/12 0415 01/10/12 0411  Weight: 226 lb 9.6 oz (102.785 kg) 234 lb 14.4 oz (106.55 kg) 234 lb 9.1 oz (106.4 kg)    Intake/Output:   Intake/Output Summary (Last 24 hours) at 01/10/12 0700 Last data filed at 01/10/12 0020  Gross per 24 hour  Intake    720 ml  Output   2675 ml  Net  -1955 ml     Physical Exam: General:  Well appearing. No resp difficulty in chair.  HEENT: normal Neck: supple. JVP to 9-10. Carotids 2+  bilat; no bruits. No lymphadenopathy or thryomegaly appreciated. Cor: PMI nondisplaced. Regular rate & rhythm. No rubs, gallops or murmurs. Lungs: clear Abdomen: soft, nontender, distended. No hepatosplenomegaly. No bruits or masses. Good bowel sounds. Extremities: no cyanosis, clubbing, rash, R and LLE 2+ edema bilateral teds  Neuro: alert & orientedx3, cranial nerves grossly intact. moves all 4 extremities w/o difficulty. Affect pleasant Speech slurred   Telemetry:  Labs: Basic Metabolic Panel:  Lab 01/09/12 1610 01/07/12 2009  NA 142 141  K 3.3* 4.2  CL 107 105  CO2 27 27  GLUCOSE 110* 124*  BUN 32* 34*  CREATININE 1.96* 1.92*  CALCIUM 8.8 8.8  MG -- --  PHOS -- --    Liver Function Tests:  Lab 01/07/12 2009  AST 15  ALT 11  ALKPHOS 70  BILITOT 0.3  PROT 6.9  ALBUMIN 3.2*   No results found for this basename: LIPASE:5,AMYLASE:5 in the last 168 hours No results found for this basename: AMMONIA:3 in the last 168 hours  CBC:  Lab 01/07/12 2009  WBC 8.8  NEUTROABS 7.0  HGB 9.7*  HCT 30.7*  MCV 85.0  PLT 303    Cardiac Enzymes: No results found for this basename: CKTOTAL:5,CKMB:5,CKMBINDEX:5,TROPONINI:5 in the last 168 hours  BNP: BNP (last 3 results)  Basename 01/07/12  2009 01/11/11 1752  PROBNP 1845.0* 5.4     Other results:  EKG:   Imaging: No results found.   Medications:     Scheduled Medications:    . allopurinol  100 mg Oral QHS  . amiodarone  100 mg Oral q morning - 10a  . amLODipine  10 mg Oral Daily  . aspirin  325 mg Oral Daily  . atorvastatin  40 mg Oral q1800  . citalopram  20 mg Oral q morning - 10a  . cloNIDine  0.1 mg Transdermal Weekly  . furosemide  80 mg Intravenous TID  . heparin  5,000 Units Subcutaneous Q8H  . hydrALAZINE  50 mg Oral TID  . insulin aspart  0-15 Units Subcutaneous TID WC  . insulin detemir  100 Units Subcutaneous Daily  . isosorbide mononitrate  60 mg Oral Daily  . levothyroxine  125 mcg Oral  QAC breakfast  . pantoprazole  40 mg Oral Daily  . potassium chloride  40 mEq Oral BID  . sodium chloride  3 mL Intravenous Q12H    Infusions:    PRN Medications: sodium chloride, acetaminophen, hydrALAZINE, sodium chloride   Assessment:  1. A/C Diastolic Heart Failure  (EF 60-65% 2011) 2. A- fib 3.  History of CVA 4. DM II 5. HTN 6. A/C renal failure ( creatinine baseline 1.7-1.9) 7. Noncompliance - (no follow up since 01/2011) 8. Hypothyroidism 9. Hypokalemia  Plan/Discussion:    Volume status improving. Weight down 2 pounds. Continue IV lasix one more day then transition to Torsemide 40 mg twice a day. BMET pending.   SBP remains elevated. Increase hydralalzine to 75 mg tid. Would like SBP <140.    Rate controlled. Continue Aspirin 325 mg daily. Due to noncompliance will not add anticoagulant.    Dispostion: Case manager following for home health once discharged. Will need HHRN and HHPT. Anticipate d/c in am    Length of Stay: 3   CLEGG,AMY 01/10/2012, 7:00 AM  Patient seen and examined with Tonye Becket, NP. We discussed all aspects of the encounter. I agree with the assessment and plan as stated above.   Patient reweighed personally. Weight down 2 pounds. I/Os inaccurate due to incontinence. Volume status seems to be improving but still somewhat overloaded. Await BMET. If Cr stable give one dose metolazone.   Hopefull ready for d/c in 24-48 hours.  Daniel Bensimhon,MD 8:54 AM

## 2012-01-10 NOTE — Progress Notes (Signed)
Called on-call physician regarding elevated BP of 179/85, orders given, will continue to monitor patient.

## 2012-01-10 NOTE — Progress Notes (Signed)
Physical Therapy Treatment Patient Details Name: [name] MRN: [id number] DOB: November 25, 1932 Today's Date: 01/10/2012 Time: 6962-9528 PT Time Calculation (min): 23 min  PT Assessment / Plan / Recommendation Comments on Treatment Session  Pt s/p CHF exacerbation with decr mobility secondary to decr endurance and decr balance with activity.  Improved balance since last treatment.  Pt appears to be close to baseline status.  Continue PT to address safety and balance.      Follow Up Recommendations  Home health PT     Does the patient have the potential to tolerate intense rehabilitation     Barriers to Discharge        Equipment Recommendations  None recommended by PT    Recommendations for Other Services    Frequency Min 3X/week   Plan Discharge plan remains appropriate;Frequency remains appropriate    Precautions / Restrictions Precautions Precautions: Fall Restrictions Weight Bearing Restrictions: No   Pertinent Vitals/Pain VSS, No pain    Mobility  Bed Mobility Bed Mobility: Not assessed Transfers Transfers: Sit to Stand;Stand to Sit Sit to Stand: 4: Min assist;With upper extremity assist;From chair/3-in-1;With armrests Stand to Sit: 4: Min assist;With upper extremity assist;To chair/3-in-1;With armrests Details for Transfer Assistance: Pt used momentum to perform sit to stand out of chair.  Pt needed steadying assist to obtain postural stability standing at EOC.  Pt did show ability to self correct.   Ambulation/Gait Ambulation/Gait Assistance: 4: Min guard Ambulation Distance (Feet): 150 Feet Assistive device: Rolling walker Ambulation/Gait Assistance Details: Pt's gait more steady today with RW.  No LOB with turns with patient demonstrating more safety today as compared to yesterday.  Pt still needed occasional cues for safety with RW.  Pt continues with excessive heel strike on right LE with shortened step length on left.   Gait Pattern: Step-through  pattern;Decreased stride length;Decreased hip/knee flexion - right;Decreased dorsiflexion - right Gait velocity: decreased Stairs: No Wheelchair Mobility Wheelchair Mobility: No    Exercises General Exercises - Upper Extremity Shoulder Flexion: AROM;Both;10 reps;Seated Shoulder Horizontal ABduction: AROM;Both;10 reps;Seated Elbow Flexion: AROM;Both;10 reps;Seated Elbow Extension: AROM;Both;10 reps;Seated General Exercises - Lower Extremity Ankle Circles/Pumps: AROM;Both;10 reps;Seated Quad Sets: AROM;Both;Supine;15 reps Long Arc Quad: AROM;Both;15 reps;Seated Hip Flexion/Marching: AROM;Both;15 reps;Seated   PT Goals Acute Rehab PT Goals PT Goal: Supine/Side to Sit - Progress: Progressing toward goal PT Goal: Sit to Stand - Progress: Progressing toward goal PT Goal: Ambulate - Progress: Progressing toward goal  Visit Information  Last PT Received On: 01/10/12 Assistance Needed: +1    Subjective Data  Subjective: "I am more unsteady than before.  I can't believe how weak I got."   Cognition  Overall Cognitive Status: Appears within functional limits for tasks assessed/performed Arousal/Alertness: Awake/alert Orientation Level: Disoriented to;Time Behavior During Session: WFL for tasks performed    Balance  Static Standing Balance Static Standing - Balance Support: Bilateral upper extremity supported;During functional activity Static Standing - Level of Assistance: 5: Stand by assistance Static Standing - Comment/# of Minutes: 2 minutes with supervision while washing hands at sink.    End of Session PT - End of Session Equipment Utilized During Treatment: Gait belt Activity Tolerance: Patient tolerated treatment well Patient left: in chair;with call bell/phone within reach Nurse Communication: Mobility status        INGOLD,Kelcy Laible 01/10/2012, 10:48 AM  [facility] [phone] (pager)

## 2012-01-11 ENCOUNTER — Encounter (HOSPITAL_COMMUNITY): Payer: Self-pay | Admitting: Emergency Medicine

## 2012-01-11 DIAGNOSIS — I5033 Acute on chronic diastolic (congestive) heart failure: Principal | ICD-10-CM

## 2012-01-11 DIAGNOSIS — I10 Essential (primary) hypertension: Secondary | ICD-10-CM

## 2012-01-11 LAB — BASIC METABOLIC PANEL
CO2: 26 mEq/L (ref 19–32)
Calcium: 9.5 mg/dL (ref 8.4–10.5)
Chloride: 98 mEq/L (ref 96–112)
Potassium: 4.5 mEq/L (ref 3.5–5.1)
Sodium: 134 mEq/L — ABNORMAL LOW (ref 135–145)

## 2012-01-11 LAB — GLUCOSE, CAPILLARY
Glucose-Capillary: 164 mg/dL — ABNORMAL HIGH (ref 70–99)
Glucose-Capillary: 125 mg/dL — ABNORMAL HIGH (ref 70–99)
Glucose-Capillary: 175 mg/dL — ABNORMAL HIGH (ref 70–99)

## 2012-01-11 MED ORDER — HYDRALAZINE HCL 100 MG PO TABS: 100.0000 mg | ORAL_TABLET | Freq: Three times a day (TID) | ORAL | Status: DC

## 2012-01-11 MED ORDER — METOLAZONE 2.5 MG PO TABS
2.5000 mg | ORAL_TABLET | Freq: Once | ORAL | Status: AC
  Administered 2012-01-11: 2.5 mg via ORAL
  Filled 2012-01-11: qty 1

## 2012-01-11 MED ORDER — HYDRALAZINE HCL 50 MG PO TABS
100.0000 mg | ORAL_TABLET | Freq: Three times a day (TID) | ORAL | Status: DC
  Administered 2012-01-11 – 2012-01-12 (×4): 100 mg via ORAL
  Filled 2012-01-11 (×6): qty 2

## 2012-01-11 NOTE — Discharge Summary (Signed)
Advanced Heart Failure Team  Discharge Summary   Patient ID: [name] MRN: [id number], DOB/AGE: 1933-02-05 78 y.o. Admit date: 01/07/2012 D/C date:     01/12/2012   Primary Discharge Diagnoses:  1. A/C Diastolic Heart Failure (EF 60-65% 2011)  2. A- fib  3. History of CVA  4. DM II  5. HTN  6. A/C renal failure ( creatinine baseline 1.7-1.9)  7. Noncompliance - (no follow up since 01/2011)  8. Hypothyroidism  9. Hypokalemia- resolved.    Hospital Course:   78 y.o. male w/ PMHx significant for HFpEF, DM, h/o Parox afib ( not on anticoagulant due to ETOH), h/o stroke, nonobs CAD. He is not on ACEIs (per chart) and BB contraindicated due to prolonged PR and wenckebach.   He presented to [facility] on 01/08/2012 with complaints of progressive dyspnea and lower extremity swelling . At the time of admit his weight was 234 pounds. Pertinent admission labs: Pro BNP 1845, Potassium 4.2, Creatinine 1.92. He was diuresed with IV lasix and Metolazone. After he was decongested he was transitioned back to Torsemide 40 mg BID.  Renal function followed closely with minimal increase in Crt. Will check BMET at next clinic visit. He remained in SR with 1 degree heart block. He continued on aspirin and was not an anticoagulant candidate due to ETOH abuse and falls. Evaluated by PT with recommendations for home health PT.  Lengthy discussions occurred regarding medication compliance, low salt food choices, and limiting fluid intake to less than 2 liters per day.   [facility] to follow once discharged for ongoing heart failure management and physical therapy. He will continue to be followed closely in the heart failure clinic with his appointment January 16, 2012 at 2:30. Discharge weight 227lbs Discharge Pro BNP 480   Discharge Vitals: Blood pressure 154/67, pulse 67, temperature 98.2 F (36.8 C), temperature source Oral, resp. rate 19, height 5\' 10"  (1.778 m), weight 227 lb 14.4 oz  (103.375 kg), SpO2 98.00%.  Labs: Lab Results  Component Value Date   WBC 8.8 01/07/2012   HGB 9.7* 01/07/2012   HCT 30.7* 01/07/2012   MCV 85.0 01/07/2012   PLT 303 01/07/2012     Lab 01/12/12 0616 01/07/12 2009  NA 137 --  K 4.6 --  CL 99 --  CO2 26 --  BUN 37* --  CREATININE 2.13* --  CALCIUM 9.9 --  PROT -- 6.9  BILITOT -- 0.3  ALKPHOS -- 70  ALT -- 11  AST -- 15  GLUCOSE 137* --    BNP (last 3 results)  Basename 01/12/12 0616 01/07/12 2009  PROBNP 479.5* 1845.0*    Diagnostic Studies/Procedures   None  Discharge Medications     Medication List     As of 01/12/2012  1:26 PM    TAKE these medications         allopurinol 100 MG tablet   Commonly known as: ZYLOPRIM   Take 100 mg by mouth at bedtime.      amiodarone 200 MG tablet   Commonly known as: PACERONE   Take 100 mg by mouth every morning.      amLODipine 10 MG tablet   Commonly known as: NORVASC   Take 10 mg by mouth daily.      aspirin 325 MG tablet   Take 1 tablet (325 mg total) by mouth daily.      atorvastatin 40 MG tablet   Commonly known as: LIPITOR   Take  1 tablet (40 mg total) by mouth daily.      citalopram 10 MG tablet   Commonly known as: CELEXA   TAKE 2 TABLETS BY MOUTH EVERY DAY      citalopram 10 MG tablet   Commonly known as: CELEXA   Take 20 mg by mouth every morning.      cloNIDine 0.1 mg/24hr patch   Commonly known as: CATAPRES - Dosed in mg/24 hr   Place 1 patch onto the skin once a week. Change on sundays      Fe Fum-Vit C-Vit B12-FA 460-60-0.01-1 MG Caps   Commonly known as: TRIGELS-F   Take 1 capsule by mouth daily.      glucose blood test strip   Use as directed three times daily dx 250.02      hydrALAZINE 100 MG tablet   Commonly known as: APRESOLINE   Take 1 tablet (100 mg total) by mouth 3 (three) times daily.      insulin detemir 100 UNIT/ML injection   Commonly known as: LEVEMIR   Inject 100 Units into the skin daily.      isosorbide  mononitrate 60 MG 24 hr tablet   Commonly known as: IMDUR   Take 60 mg by mouth daily.      levothyroxine 125 MCG tablet   Commonly known as: SYNTHROID, LEVOTHROID   TAKE 1 TABLET BY MOUTH DAILY.      levothyroxine 125 MCG tablet   Commonly known as: SYNTHROID, LEVOTHROID   Take 125 mcg by mouth daily.      omeprazole 20 MG capsule   Commonly known as: PRILOSEC   TAKE 1 CAPSULE BY MOUTH EVERY DAY      omeprazole 20 MG capsule   Commonly known as: PRILOSEC   Take 20 mg by mouth daily.      torsemide 20 MG tablet   Commonly known as: DEMADEX   Take 40 mg by mouth 2 (two) times daily.          Disposition   The patient will be discharged in stable condition to home. Discharge Orders    Future Appointments: Provider: Department: Dept Phone: Center:   01/16/2012 2:30 PM [facility] [phone] None     Future Orders Please Complete By Expires   Diet - low sodium heart healthy      Increase activity slowly      (HEART FAILURE PATIENTS) Call MD:  Anytime you have any of the following symptoms: 1) 3 pound weight gain in 24 hours or 5 pounds in 1 week 2) shortness of breath, with or without a dry hacking cough 3) swelling in the hands, feet or stomach 4) if you have to sleep on extra pillows at night in order to breathe.      Contraindication to ACEI at discharge      Scheduling Instructions:   Diastolic heart failure     Follow-up Information    Follow up with Arvilla Meres, MD. On 01/16/2012. (at 2:30 Garage Code [id number])    Contact information:   [address] [place] [address] [phone]            Duration of Discharge Encounter: Greater than 35 minutes   [place], [place] 01/12/12 1336

## 2012-01-11 NOTE — Progress Notes (Signed)
Advanced Heart Failure Rounding Note   Subjective:   78 y.o. male w/ PMHx significant for HFpEF, DM, h/o Parox afib ( not on anticoagulant due to ETOH), h/o stroke, nonobs CAD.  He is not on ACEIs (per chart) and BB contraindicated due to prolonged PR and wenckebach.   He has been followed on the heart failure clinic and he was last seen 01/2011. Weight at that time was 223 pounds. Home diuretic regimen Demadex 40 mg bid.   He lives alone. Ambulates with a walker. His daughter prepares his pill box and meals. He says that he stopped weighing ans has been eating high salt foods. Uses oxygen at night. Denies alcohol use. Speech slurred at baseline.    He presented to [facility] on 01/08/2012 with complaints of progressive dyspnea and lower extremity swelling . Admit weight 226 pounds. Pertinent admission labs: Pro BNP 1845, Potassium 4.2, Creatinine 1.92.    Yesterday hydralazine increased to 75 mg tid and he continued on Lasix 80 mg IV tid and received Metolazone 2.5 mg.    24 hour I/O - 3.6 liters. BMET pending. Overall weight down  5 pounds. Weight on standing scale 229 pounds.  Denies SOB/PND/Orthopnea.         Objective:   Weight Range:  Vital Signs:   Temp:  [97.8 F (36.6 C)-98.5 F (36.9 C)] 97.8 F (36.6 C) (11/22 0458) Pulse Rate:  [70-78] 78  (11/22 0458) Resp:  [18-19] 19  (11/22 0458) BP: (123-174)/(62-71) 174/71 mmHg (11/22 0458) SpO2:  [90 %-98 %] 98 % (11/22 0458) Weight:  [229 lb 4.5 oz (104 kg)-234 lb 9.1 oz (106.4 kg)] 229 lb 4.5 oz (104 kg) (11/22 0458) Last BM Date: 01/09/12  Weight change: Filed Weights   01/10/12 0411 01/10/12 2052 01/11/12 0458  Weight: 234 lb 9.1 oz (106.4 kg) 234 lb 9.1 oz (106.4 kg) 229 lb 4.5 oz (104 kg)    Intake/Output:   Intake/Output Summary (Last 24 hours) at 01/11/12 0657 Last data filed at 01/11/12 0513  Gross per 24 hour  Intake    870 ml  Output   4550 ml  Net  -3680 ml     Physical Exam: General:   Well appearing. No resp difficulty in chair.  HEENT: normal Neck: supple. JVP to 8-9. Carotids 2+ bilat; no bruits. No lymphadenopathy or thryomegaly appreciated. Cor: PMI nondisplaced. Regular rate & rhythm. No rubs, gallops or murmurs. Lungs: clear Abdomen: soft, nontender, distended. No hepatosplenomegaly. No bruits or masses. Good bowel sounds. Extremities: no cyanosis, clubbing, rash, R and LLE 1+ edema bilateral teds  Neuro: alert & orientedx3, cranial nerves grossly intact. moves all 4 extremities w/o difficulty. Affect pleasant Speech slurred   Telemetry:  Labs: Basic Metabolic Panel:  Lab 01/10/12 1610 01/09/12 0535 01/07/12 2009  NA 141 142 141  K 3.6 3.3* 4.2  CL 105 107 105  CO2 25 27 27   GLUCOSE 177* 110* 124*  BUN 31* 32* 34*  CREATININE 1.77* 1.96* 1.92*  CALCIUM 8.8 8.8 8.8  MG -- -- --  PHOS -- -- --    Liver Function Tests:  Lab 01/07/12 2009  AST 15  ALT 11  ALKPHOS 70  BILITOT 0.3  PROT 6.9  ALBUMIN 3.2*   No results found for this basename: LIPASE:5,AMYLASE:5 in the last 168 hours No results found for this basename: AMMONIA:3 in the last 168 hours  CBC:  Lab 01/07/12 2009  WBC 8.8  NEUTROABS 7.0  HGB 9.7*  HCT  30.7*  MCV 85.0  PLT 303    Cardiac Enzymes: No results found for this basename: CKTOTAL:5,CKMB:5,CKMBINDEX:5,TROPONINI:5 in the last 168 hours  BNP: BNP (last 3 results)  Basename 01/07/12 2009 01/11/11 1752  PROBNP 1845.0* 5.4     Other results:  EKG:   Imaging: No results found.   Medications:     Scheduled Medications:    . allopurinol  100 mg Oral QHS  . amiodarone  100 mg Oral q morning - 10a  . amLODipine  10 mg Oral Daily  . aspirin  325 mg Oral Daily  . atorvastatin  40 mg Oral q1800  . citalopram  20 mg Oral q morning - 10a  . cloNIDine  0.1 mg Transdermal Weekly  . furosemide  80 mg Intravenous TID  . heparin  5,000 Units Subcutaneous Q8H  . hydrALAZINE  75 mg Oral TID  . insulin aspart  0-15  Units Subcutaneous TID WC  . insulin detemir  100 Units Subcutaneous Daily  . isosorbide mononitrate  60 mg Oral Daily  . levothyroxine  125 mcg Oral QAC breakfast  . [COMPLETED] metolazone  2.5 mg Oral Once  . pantoprazole  40 mg Oral Daily  . potassium chloride  40 mEq Oral BID  . [COMPLETED] potassium chloride  40 mEq Oral Once  . sodium chloride  3 mL Intravenous Q12H  . [DISCONTINUED] hydrALAZINE  50 mg Oral TID    Infusions:    PRN Medications: sodium chloride, acetaminophen, hydrALAZINE, sodium chloride   Assessment:  1. A/C Diastolic Heart Failure  (EF 60-65% 2011) 2. A- fib 3.  History of CVA 4. DM II 5. HTN 6. A/C renal failure ( creatinine baseline 1.7-1.9) 7. Noncompliance - (no follow up since 01/2011) 8. Hypothyroidism 9. Hypokalemia  Plan/Discussion:    Volume status improving. Weight down 5 pounds. Continue IV lasix today and given one dose Metolazone 2.5 mg. Possible transition to Torsemide 40 mg twice a day tomorrow. Would like to get as dry possible prior to discharge. BMET pending.   SBP remains elevated. Increase hydralazine to 100 mg tid.   Rate controlled. Continue Aspirin 325 mg daily. Due to noncompliance will not add anticoagulant.    Dispostion: Case manager following for home health once discharged. Will need HHRN and HHPT. Anticipate d/c in am    Length of Stay: 4  CLEGG,AMY 01/11/2012, 6:57 AM  Patient seen and examined with Tonye Becket, NP. We discussed all aspects of the encounter. I agree with the assessment and plan as stated above.   Starting to mobilize fluid. Renal function stable. Down 5 pounds. Continue IV lasix and metolazone. Hopefully home in 24-48 hours.  Titrate hydralazine for HTN.   Sequoia Witz,MD 12:55 PM

## 2012-01-11 NOTE — Progress Notes (Signed)
Physical Therapy Treatment Patient Details Name: MAXIE KLEMP MRN: [id number] DOB: 09-Sep-1932 Today's Date: 01/11/2012 Time: 4098-1191 PT Time Calculation (min): 15 min  PT Assessment / Plan / Recommendation Comments on Treatment Session  Pt s/p CHF exacerbation with decr mobility secondary to decr endurance with balance much improved over the last few treatments.  Continue PT to progress endurance.      Follow Up Recommendations  Home health PT     Does the patient have the potential to tolerate intense rehabilitation     Barriers to Discharge        Equipment Recommendations  None recommended by PT    Recommendations for Other Services    Frequency Min 3X/week   Plan Discharge plan remains appropriate;Frequency remains appropriate    Precautions / Restrictions Precautions Precautions: Fall Restrictions Weight Bearing Restrictions: No   Pertinent Vitals/Pain VSS, No pain    Mobility  Bed Mobility Bed Mobility: Not assessed Transfers Transfers: Sit to Stand;Stand to Sit Sit to Stand: 4: Min guard;With upper extremity assist;From chair/3-in-1;With armrests Stand to Sit: 4: Min assist;With upper extremity assist;With armrests;To chair/3-in-1 Details for Transfer Assistance: Continues to use momentum to perform sit to stand out of chair.  Pt with better postural stability overall today.   Ambulation/Gait Ambulation/Gait Assistance: 4: Min guard Ambulation Distance (Feet): 160 Feet Assistive device: Rolling walker Ambulation/Gait Assistance Details: Pt. with improved safety awareness overall today.  No LOB with turns with patient continuing to demonstrate improved gait patterns.  Pt with better control of right LE with less excessive heel strike today.   Gait Pattern: Step-through pattern;Decreased stride length;Decreased hip/knee flexion - right;Decreased dorsiflexion - right Gait velocity: decreased Stairs: No Wheelchair Mobility Wheelchair Mobility: No      Exercises General Exercises - Lower Extremity Long Arc Quad: AROM;Both;10 reps;Seated Hip Flexion/Marching: AROM;Both;10 reps;Seated   PT Goals Acute Rehab PT Goals PT Goal: Sit to Stand - Progress: Progressing toward goal PT Goal: Ambulate - Progress: Progressing toward goal  Visit Information  Last PT Received On: 01/11/12 Assistance Needed: +1    Subjective Data  Subjective: "I feel so much better and can breathe better." Patient Stated Goal: To go home.   Cognition  Overall Cognitive Status: Appears within functional limits for tasks assessed/performed Arousal/Alertness: Awake/alert Orientation Level: Appears intact for tasks assessed Behavior During Session: [facility] for tasks performed    Balance  Static Standing Balance Static Standing - Balance Support: Bilateral upper extremity supported;During functional activity Static Standing - Level of Assistance: 5: Stand by assistance Static Standing - Comment/# of Minutes: 2 minutes with supervision  High Level Balance High Level Balance Activites: Turns;Sudden stops;Head turns High Level Balance Comments: Pt able to perform all of the above with RW with no LOB and min guard assist.  End of Session PT - End of Session Equipment Utilized During Treatment: Gait belt Activity Tolerance: Patient tolerated treatment well Patient left: in chair;with call bell/phone within reach Nurse Communication: Mobility status        INGOLD,Dominga Mcduffie 01/11/2012, 2:15 PM [facility] Acute Rehabilitation [phone] (pager)

## 2012-01-12 DIAGNOSIS — I4891 Unspecified atrial fibrillation: Secondary | ICD-10-CM

## 2012-01-12 LAB — GLUCOSE, CAPILLARY: Glucose-Capillary: 177 mg/dL — ABNORMAL HIGH (ref 70–99)

## 2012-01-12 LAB — BASIC METABOLIC PANEL
Calcium: 9.9 mg/dL (ref 8.4–10.5)
Creatinine, Ser: 2.13 mg/dL — ABNORMAL HIGH (ref 0.50–1.35)
GFR calc Af Amer: 33 mL/min — ABNORMAL LOW (ref >90)

## 2012-01-12 MED ORDER — POTASSIUM CHLORIDE CRYS ER 20 MEQ PO TBCR: 20.0000 meq | EXTENDED_RELEASE_TABLET | Freq: Every day | ORAL | Status: DC

## 2012-01-12 NOTE — Progress Notes (Addendum)
Advanced Heart Failure Rounding Note   Subjective:   78 y.o. male w/ PMHx significant for HFpEF, DM, h/o Parox afib ( not on anticoagulant due to ETOH), h/o stroke, nonobs CAD.  He is not on ACEIs (per chart) and BB contraindicated due to prolonged PR and wenckebach.   He has been followed on the heart failure clinic and he was last seen 01/2011. Weight at that time was 223 pounds. Home diuretic regimen Demadex 40 mg bid.   He lives alone. Ambulates with a walker. His daughter prepares his pill box and meals. He says that he stopped weighing ans has been eating high salt foods. Uses oxygen at night. Denies alcohol use. Speech slurred at baseline.    He presented to [facility] on 01/08/2012 with complaints of progressive dyspnea and lower extremity swelling . Admit weight 226 pounds. Pertinent admission labs: Pro BNP 1845, Potassium 4.2, Creatinine 1.92.    Yesterday hydralazine increased to 75 mg tid and he continued on Lasix 80 mg IV tid and received Metolazone 2.5 mg.    24 hour I/O - 3.6 liters. BMET pending. Overall weight down  5 pounds. Weight on standing scale 229 pounds.  Denies SOB/PND/Orthopnea.         Objective:   Weight Range:  Vital Signs:   Temp:  [98 F (36.7 C)-98.6 F (37 C)] 98.2 F (36.8 C) (11/23 0500) Pulse Rate:  [67-73] 67  (11/23 1054) Resp:  [18-19] 19  (11/23 0500) BP: (121-155)/(60-83) 154/67 mmHg (11/23 1054) SpO2:  [98 %] 98 % (11/23 0500) Weight:  [103.375 kg (227 lb 14.4 oz)] 103.375 kg (227 lb 14.4 oz) (11/23 0500) Last BM Date: 01/12/12  Weight change: Filed Weights   01/10/12 2052 01/11/12 0458 01/12/12 0500  Weight: 106.4 kg (234 lb 9.1 oz) 104 kg (229 lb 4.5 oz) 103.375 kg (227 lb 14.4 oz)    Intake/Output:   Intake/Output Summary (Last 24 hours) at 01/12/12 1235 Last data filed at 01/12/12 1100  Gross per 24 hour  Intake    740 ml  Output   2225 ml  Net  -1485 ml     Physical Exam: General:  Well appearing. No resp  difficulty in chair.  HEENT: normal Neck: supple. JVP to 8-9. Carotids 2+ bilat; no bruits. No lymphadenopathy or thryomegaly appreciated. Cor: PMI nondisplaced. Regular rate & rhythm. No rubs, gallops or murmurs. Lungs: clear Abdomen: soft, nontender, distended. No hepatosplenomegaly. No bruits or masses. Good bowel sounds. Extremities: no cyanosis, clubbing, rash, R and LLE 1+ edema bilateral teds  Neuro: alert & orientedx3, cranial nerves grossly intact. moves all 4 extremities w/o difficulty. Affect pleasant Speech slurred   Telemetry:  Labs: Basic Metabolic Panel:  Lab 01/12/12 1610 01/11/12 0620 01/10/12 0745 01/09/12 0535 01/07/12 2009  NA 137 134* 141 142 141  K 4.6 4.5 3.6 3.3* 4.2  CL 99 98 105 107 105  CO2 26 26 25 27 27   GLUCOSE 137* 106* 177* 110* 124*  BUN 37* 31* 31* 32* 34*  CREATININE 2.13* 1.87* 1.77* 1.96* 1.92*  CALCIUM 9.9 9.5 8.8 -- --  MG -- -- -- -- --  PHOS -- -- -- -- --    Liver Function Tests:  Lab 01/07/12 2009  AST 15  ALT 11  ALKPHOS 70  BILITOT 0.3  PROT 6.9  ALBUMIN 3.2*   No results found for this basename: LIPASE:5,AMYLASE:5 in the last 168 hours No results found for this basename: AMMONIA:3 in the last 168  hours  CBC:  Lab 01/07/12 2009  WBC 8.8  NEUTROABS 7.0  HGB 9.7*  HCT 30.7*  MCV 85.0  PLT 303    Cardiac Enzymes: No results found for this basename: CKTOTAL:5,CKMB:5,CKMBINDEX:5,TROPONINI:5 in the last 168 hours  BNP: BNP (last 3 results)  Basename 01/12/12 0616 01/07/12 2009  PROBNP 479.5* 1845.0*     Other results:  EKG:   Imaging: No results found.   Medications:     Scheduled Medications:    . allopurinol  100 mg Oral QHS  . amiodarone  100 mg Oral q morning - 10a  . amLODipine  10 mg Oral Daily  . aspirin  325 mg Oral Daily  . atorvastatin  40 mg Oral q1800  . citalopram  20 mg Oral q morning - 10a  . cloNIDine  0.1 mg Transdermal Weekly  . furosemide  80 mg Intravenous TID  . heparin   5,000 Units Subcutaneous Q8H  . hydrALAZINE  100 mg Oral TID  . insulin aspart  0-15 Units Subcutaneous TID WC  . insulin detemir  100 Units Subcutaneous Daily  . isosorbide mononitrate  60 mg Oral Daily  . levothyroxine  125 mcg Oral QAC breakfast  . [COMPLETED] metolazone  2.5 mg Oral Once  . pantoprazole  40 mg Oral Daily  . potassium chloride  40 mEq Oral BID  . sodium chloride  3 mL Intravenous Q12H    Infusions:    PRN Medications: sodium chloride, acetaminophen, hydrALAZINE, sodium chloride   Assessment:  1. A/C Diastolic Heart Failure  (EF 60-65% 2011) 2. A- fib 3.  History of CVA 4. DM II 5. HTN 6. A/C renal failure ( creatinine baseline 1.7-1.9) 7. Noncompliance - (no follow up since 01/2011) 8. Hypothyroidism 9. Hypokalemia  Plan/Discussion:    Volume status improving. Now back to baseline. BP still mildly elevated. Hydralazine titrated yesterday.  AF is rate controlled. Continue Aspirin 325 mg daily. Due to noncompliance will not add anticoagulant.   Has home health arranged. Will f/u in HF clinic next week.   Will send home today on current regimen and demadex 40 bid. Please make sure he has f/u in HF clinic this week. Stressed need to be compliant with meds.   Length of Stay: 5  Arvilla Meres 01/12/2012, 12:35 PM

## 2012-01-12 NOTE — Progress Notes (Signed)
Pt discharged to home per MD order. Pt and daughter received all discharge instructions and medication information including follow-up appointments and prescriptions.  Pt and daughter reviewed and received heart failure education materials prior to discharge.  Pt alert and oriented at discharge with no complaints of pain.  Pt escorted to private vehicle via wheelchair by nurse tech. [name]

## 2012-01-13 NOTE — Discharge Summary (Signed)
Agree with d/c note. See my progress note from same day for full details.   Daniel Bensimhon,MD 8:55 AM  

## 2012-01-14 ENCOUNTER — Ambulatory Visit: Payer: Medicare Other | Admitting: Internal Medicine

## 2012-01-14 DIAGNOSIS — I5032 Chronic diastolic (congestive) heart failure: Secondary | ICD-10-CM

## 2012-01-15 ENCOUNTER — Telehealth: Payer: Self-pay

## 2012-01-15 NOTE — Telephone Encounter (Signed)
Ok for verbal 

## 2012-01-15 NOTE — Telephone Encounter (Signed)
HHRN is requesting a verbal ok for OT evaluation.  Please advise call back number is [phone]

## 2012-01-16 ENCOUNTER — Encounter (HOSPITAL_COMMUNITY): Payer: Medicare Other

## 2012-01-21 ENCOUNTER — Encounter (HOSPITAL_COMMUNITY): Payer: Medicare Other

## 2012-01-22 ENCOUNTER — Encounter (HOSPITAL_COMMUNITY): Payer: Self-pay

## 2012-01-22 ENCOUNTER — Ambulatory Visit (HOSPITAL_COMMUNITY)
Admission: RE | Admit: 2012-01-22 | Discharge: 2012-01-22 | Disposition: A | Discharge status: Home / Self Care | Payer: Medicare Other | Source: Ambulatory Visit | Attending: Internal Medicine | Admitting: Internal Medicine

## 2012-01-22 VITALS — BP 152/78 | HR 70 | Wt 237.4 lb

## 2012-01-22 DIAGNOSIS — I5030 Unspecified diastolic (congestive) heart failure: Secondary | ICD-10-CM | POA: Insufficient documentation

## 2012-01-22 DIAGNOSIS — I5032 Chronic diastolic (congestive) heart failure: Secondary | ICD-10-CM

## 2012-01-22 DIAGNOSIS — I10 Essential (primary) hypertension: Secondary | ICD-10-CM

## 2012-01-22 NOTE — Progress Notes (Addendum)
HPI:  [name] is a very pleasant 78 year old male with  a history of diastolic heart failure as well as 2nd degree heart block (Wenckebach), hypertension,  hyperlipidemia, diabetes, previous CVA, and paroxysmal atrial fibrillation on amiodarone. Not on coumadin as felt to be high risk of bleeding due to h/o ETOH. Echo 3/11 EF 60-65% mild MR.   He returns for post hospital follow up today with his son.  Admitted for HF exacerbation.  After IV lasix and metolazone weight dropped from 234 pounds to 227 pounds.   He feels good since discharge.  His weight in the clinic is up to 337 pounds although he says his scale says 223 pounds since discharge.  He denies cough.  Chronic recliner sleeper.  He may be taking in more than 2 liters of fluid.  His children also cooked Thanksgiving for him and did not follow low sodium restrictions.   ROS: All systems negative except as listed in HPI, PMH and Problem List.  Past Medical History  Diagnosis Date  . Chronic diastolic heart failure     secondary to diastolic dysfunction EF (previous EF 35-45%)ef 60%4/09  . Arrhythmia     atrial fibrillation /pt on amiodarone,thought to be poor coumadin  . Stroke   . Other and unspecified hyperlipidemia   . Hypertension   . Hypertrophy of prostate without urinary obstruction and other lower urinary tract symptoms (LUTS)   . Osteoarthrosis, unspecified whether generalized or localized, unspecified site   . Type II or unspecified type diabetes mellitus without mention of complication, not stated as uncontrolled   . AV block, Mob[place] 1     Intolerance to ACE's / discontiuation of beta blockers  . Obstructive sleep apnea   . Iron deficiency anemia, unspecified     s/p EGD and coloscopy 3/10.gastritis.hemorrhids  . Cerebrovascular accident   . Renal insufficiency   . Obesity   . Allergic rhinitis, cause unspecified 05/29/2010  . Prostate cancer   . CAD (coronary artery disease)     cath 12/04: mLAD 50-60%, mCFX  20-30%, pRCA 50-60%, mRCA 40%    Current Outpatient Prescriptions  Medication Sig Dispense Refill  . allopurinol (ZYLOPRIM) 100 MG tablet Take 100 mg by mouth at bedtime.      Marland Kitchen amiodarone (PACERONE) 200 MG tablet Take 100 mg by mouth every morning.      Marland Kitchen amLODipine (NORVASC) 10 MG tablet Take 10 mg by mouth daily.      Marland Kitchen aspirin 325 MG tablet Take 1 tablet (325 mg total) by mouth daily.  30 tablet  6  . atorvastatin (LIPITOR) 40 MG tablet Take 1 tablet (40 mg total) by mouth daily.  30 tablet  6  . citalopram (CELEXA) 10 MG tablet TAKE 2 TABLETS BY MOUTH EVERY [name]  60 tablet  5  . citalopram (CELEXA) 10 MG tablet Take 20 mg by mouth every morning.      . cloNIDine (CATAPRES - DOSED IN MG/24 HR) 0.1 mg/24hr patch Place 1 patch onto the skin once a week. Change on sundays      . Fe Fum-Vit C-Vit B12-FA (TRIGELS-F) 460-60-0.01-1 MG CAPS Take 1 capsule by mouth daily.  30 capsule  6  . glucose blood (ONE TOUCH ULTRA TEST) test strip Use as directed three times daily dx 250.02  100 each  3  . hydrALAZINE (APRESOLINE) 100 MG tablet Take 1 tablet (100 mg total) by mouth 3 (three) times daily.  90 tablet  3  . insulin detemir (LEVEMIR)  100 UNIT/ML injection Inject 100 Units into the skin daily.  30 mL  11  . isosorbide mononitrate (IMDUR) 60 MG 24 hr tablet Take 60 mg by mouth daily.      Marland Kitchen levothyroxine (SYNTHROID, LEVOTHROID) 125 MCG tablet TAKE 1 TABLET BY MOUTH DAILY.  30 tablet  5  . levothyroxine (SYNTHROID, LEVOTHROID) 125 MCG tablet Take 125 mcg by mouth daily.      Marland Kitchen omeprazole (PRILOSEC) 20 MG capsule TAKE 1 CAPSULE BY MOUTH EVERY [name]  30 capsule  5  . omeprazole (PRILOSEC) 20 MG capsule Take 20 mg by mouth daily.      . potassium chloride SA (K-DUR,KLOR-CON) 20 MEQ tablet Take 1 tablet (20 mEq total) by mouth daily.  30 tablet  6  . torsemide (DEMADEX) 20 MG tablet Take 40 mg by mouth 2 (two) times daily.          PHYSICAL EXAM: Filed Vitals:   01/22/12 1606  BP: 152/78  Pulse: 70   Weight: 237 lb 6.4 oz (107.684 kg)  SpO2: 90%    General:  Elderly male. no resp difficulty. walks with rolling walker today HEENT: normal except for facial droop Neck: supple. JVP 8-9.  Carotids 2+ bilat; no bruits.  Cor: PMI nondisplaced. RRR.  Soft S4.  Lungs: CTA  Abdomen: obese .soft, nontender. Mild distention, Good bowel sounds. Extremities: no cyanosis, clubbing, rash. 1-2+ LE edema    Neuro: alert & orientedx3, cranial nerves grossly intact except for facial droop. moves all 4 extremities w/o difficulty. affect pleasant     ASSESSMENT & PLAN:

## 2012-01-22 NOTE — Patient Instructions (Addendum)
Metolazone 2.5 mg for 2 days.  Increase potassium to twice daily for 2 days  Follow up 1 week.  Do the following things EVERYDAY: 1) Weigh yourself in the morning before breakfast. Write it down and keep it in a log. 2) Take your medicines as prescribed 3) Eat low salt foods-Limit salt (sodium) to 2000 mg per day.  4) Stay as active as you can everyday 5) Limit all fluids for the day to less than 2 liters

## 2012-01-23 ENCOUNTER — Other Ambulatory Visit: Payer: Self-pay | Admitting: Internal Medicine

## 2012-01-23 NOTE — Assessment & Plan Note (Signed)
Uncontrolled.  Will diuresis and can titrate anti-hypertensives in 1 week if it remains high.

## 2012-01-23 NOTE — Assessment & Plan Note (Addendum)
Volume status climbing (10 pounds) in setting of dietary and fluid noncompliance.  Will give metolazone 2.5 mg for 2 days with extra Kdur with metolazone.  Have re-educated on low sodium diet and fluid restrictions.  The patient and family voice understanding.  They will also check the patient's scale as he states his weight is unchanged since discharge.  Will have him follow up in 1 week with BMET.

## 2012-01-29 ENCOUNTER — Ambulatory Visit (HOSPITAL_COMMUNITY)
Admission: RE | Admit: 2012-01-29 | Discharge: 2012-01-29 | Disposition: A | Discharge status: Home / Self Care | Payer: Medicare Other | Source: Ambulatory Visit | Attending: Internal Medicine | Admitting: Internal Medicine

## 2012-01-29 VITALS — BP 160/76 | HR 85 | Wt 233.8 lb

## 2012-01-29 DIAGNOSIS — I10 Essential (primary) hypertension: Secondary | ICD-10-CM

## 2012-01-29 DIAGNOSIS — I5032 Chronic diastolic (congestive) heart failure: Secondary | ICD-10-CM

## 2012-01-29 LAB — BASIC METABOLIC PANEL
CO2: 26 mEq/L (ref 19–32)
Calcium: 9.4 mg/dL (ref 8.4–10.5)
Creatinine, Ser: 2.32 mg/dL — ABNORMAL HIGH (ref 0.50–1.35)
GFR calc Af Amer: 30 mL/min — ABNORMAL LOW (ref >90)
Sodium: 139 mEq/L (ref 135–145)

## 2012-01-29 MED ORDER — TORSEMIDE 20 MG PO TABS: 40.0000 mg | ORAL_TABLET | Freq: Two times a day (BID) | ORAL | Status: DC

## 2012-01-29 NOTE — Progress Notes (Signed)
Patient ID: [name], male   DOB: 09-29-1932, 78 y.o.   MRN: [id number]  HPI:  [name] is a very pleasant 78 year old male with  a history of diastolic heart failure as well as 2nd degree heart block (Wenckebach), hypertension,  hyperlipidemia, diabetes, previous CVA, and paroxysmal atrial fibrillation on amiodarone. Not on coumadin as felt to be high risk of bleeding due to h/o ETOH. Echo 3/11 EF 60-65% mild MR.   Discharged from [facility] 01/15/12 after being diuresed. Diuresed 7 pounds. Discharge weight 227 pounds.   He returns for follow up. Last week he was instructed to take Metolazone 2.5 for 2 days due to weight gain. Feels great. Denies SOB/PND/Orthopnea. Weight at home 223 pounds. Followed by [facility] .  His daughter prepares medications and buys groceries weekly. Complaint with medications however he was only taking Torsemide 20 mg in am and pm instead of 40 mg twice a day as noted in discharged summary on 01/15/12 . Lives alone. Walks with rolling walker.      ROS: All systems negative except as listed in HPI, PMH and Problem List.  Past Medical History  Diagnosis Date  . Chronic diastolic heart failure     secondary to diastolic dysfunction EF (previous EF 35-45%)ef 60%4/09  . Arrhythmia     atrial fibrillation /pt on amiodarone,thought to be poor coumadin  . Stroke   . Other and unspecified hyperlipidemia   . Hypertension   . Hypertrophy of prostate without urinary obstruction and other lower urinary tract symptoms (LUTS)   . Osteoarthrosis, unspecified whether generalized or localized, unspecified site   . Type II or unspecified type diabetes mellitus without mention of complication, not stated as uncontrolled   . AV block, Mobitz 1     Intolerance to ACE's / discontiuation of beta blockers  . Obstructive sleep apnea   . Iron deficiency anemia, unspecified     s/p EGD and coloscopy 3/10.gastritis.hemorrhids  . Cerebrovascular accident   . Renal insufficiency   . Obesity    . Allergic rhinitis, cause unspecified 05/29/2010  . Prostate cancer   . CAD (coronary artery disease)     cath 12/04: mLAD 50-60%, mCFX 20-30%, pRCA 50-60%, mRCA 40%    Current Outpatient Prescriptions  Medication Sig Dispense Refill  . allopurinol (ZYLOPRIM) 100 MG tablet Take 100 mg by mouth at bedtime.      Marland Kitchen amiodarone (PACERONE) 200 MG tablet Take 100 mg by mouth every morning.      Marland Kitchen amLODipine (NORVASC) 10 MG tablet Take 10 mg by mouth daily.      Marland Kitchen aspirin 325 MG tablet Take 1 tablet (325 mg total) by mouth daily.  30 tablet  6  . atorvastatin (LIPITOR) 40 MG tablet Take 1 tablet (40 mg total) by mouth daily.  30 tablet  6  . citalopram (CELEXA) 10 MG tablet TAKE 2 TABLETS BY MOUTH EVERY DAY  60 tablet  5  . cloNIDine (CATAPRES - DOSED IN MG/24 HR) 0.1 mg/24hr patch Place 1 patch onto the skin once a week. Change on sundays      . Fe Fum-Vit C-Vit B12-FA (TRIGELS-F) 460-60-0.01-1 MG CAPS Take 1 capsule by mouth daily.  30 capsule  6  . glucose blood (ONE TOUCH ULTRA TEST) test strip Use as directed three times daily dx 250.02  100 each  3  . hydrALAZINE (APRESOLINE) 100 MG tablet Take 1 tablet (100 mg total) by mouth 3 (three) times daily.  90 tablet  3  .  insulin detemir (LEVEMIR) 100 UNIT/ML injection Inject 100 Units into the skin daily.  30 mL  11  . isosorbide mononitrate (IMDUR) 60 MG 24 hr tablet Take 60 mg by mouth daily.      Marland Kitchen levothyroxine (SYNTHROID, LEVOTHROID) 125 MCG tablet Take 125 mcg by mouth daily.      Marland Kitchen omeprazole (PRILOSEC) 20 MG capsule TAKE 1 CAPSULE BY MOUTH EVERY DAY  30 capsule  5  . omeprazole (PRILOSEC) 20 MG capsule Take 20 mg by mouth daily.      . potassium chloride SA (K-DUR,KLOR-CON) 20 MEQ tablet Take 1 tablet (20 mEq total) by mouth daily.  30 tablet  6  . torsemide (DEMADEX) 20 MG tablet Take 40 mg by mouth 2 (two) times daily.          PHYSICAL EXAM: Filed Vitals:   01/29/12 1525  BP: 160/76  Pulse: 85  Weight: 233 lb 12 oz (106.028 kg)   SpO2: 90%   237 pounds>233 pounds General:  Elderly male. no resp difficulty. walks with rolling walker today (duaghter present) HEENT: normal except for facial droop Neck: supple. JVP 10-11.  Carotids 2+ bilat; no bruits.  Cor: PMI nondisplaced. RRR.  Soft S4.  Lungs: CTA  Abdomen: obese .soft, nontender. Mild distention, Good bowel sounds. Extremities: no cyanosis, clubbing, rash. RLE and LLE 2+ edema    Neuro: alert & orientedx3, cranial nerves grossly intact except for facial droop. moves all 4 extremities w/o difficulty. affect pleasant     ASSESSMENT & PLAN:

## 2012-01-29 NOTE — Assessment & Plan Note (Signed)
Diuretics increased today. Reassess BP next week.

## 2012-01-29 NOTE — Patient Instructions (Addendum)
Take Torsemide 40 mg (2 tabs) in am and in pm  Follow up next week   Do the following things EVERYDAY: 1) Weigh yourself in the morning before breakfast. Write it down and keep it in a log. 2) Take your medicines as prescribed 3) Eat low salt foods-Limit salt (sodium) to 2000 mg per day.  4) Stay as active as you can everyday 5) Limit all fluids for the day to less than 2 liters

## 2012-01-29 NOTE — Assessment & Plan Note (Signed)
Volume status improved but remains elevated. Weight down 4 pounds with an additional 5-7 pounds of fluid. After review of medications he has been taking half of recommended Torsemide dose therefore he is instructed to take Torsemide 40 mg twice a day. Reinforced daily weights, limiting fluid intake, and low salt food choices. Check BMET.  Follow up next week to reassess volume .

## 2012-02-01 ENCOUNTER — Other Ambulatory Visit: Payer: Self-pay | Admitting: Internal Medicine

## 2012-02-04 DIAGNOSIS — R0989 Other specified symptoms and signs involving the circulatory and respiratory systems: Secondary | ICD-10-CM

## 2012-02-06 ENCOUNTER — Ambulatory Visit (HOSPITAL_COMMUNITY)
Admission: RE | Admit: 2012-02-06 | Discharge: 2012-02-06 | Disposition: A | Discharge status: Home / Self Care | Payer: Medicare Other | Source: Ambulatory Visit | Attending: Internal Medicine | Admitting: Internal Medicine

## 2012-02-06 VITALS — BP 146/70 | HR 70 | Wt 234.5 lb

## 2012-02-06 DIAGNOSIS — N183 Chronic kidney disease, stage 3 unspecified: Secondary | ICD-10-CM | POA: Insufficient documentation

## 2012-02-06 DIAGNOSIS — I5032 Chronic diastolic (congestive) heart failure: Secondary | ICD-10-CM | POA: Insufficient documentation

## 2012-02-06 LAB — BASIC METABOLIC PANEL
Calcium: 9.6 mg/dL (ref 8.4–10.5)
GFR calc Af Amer: 36 mL/min — ABNORMAL LOW (ref >90)
GFR calc non Af Amer: 31 mL/min — ABNORMAL LOW (ref >90)
Glucose, Bld: 69 mg/dL — ABNORMAL LOW (ref 70–99)
Potassium: 4.4 mEq/L (ref 3.5–5.1)
Sodium: 139 mEq/L (ref 135–145)

## 2012-02-06 NOTE — Progress Notes (Signed)
HPI:  [name] is a very pleasant 78 year old male with  a history of diastolic heart failure as well as 2nd degree heart block (Wenckebach), hypertension,  hyperlipidemia, diabetes, previous CVA, and paroxysmal atrial fibrillation on amiodarone. Not on coumadin as felt to be high risk of bleeding due to h/o ETOH. Echo 3/11 EF 60-65% mild MR.   Discharged from [facility] 01/15/12 after being diuresed. Diuresed 7 pounds. Discharge weight 227 pounds.   He returns for follow up today with [name].  He feels well.  Travis weight at home is 220-221 pounds.  He denies dizziness/syncope.  [facility] to do exercises which includes stretches and light exercises.  He is doing this without difficulty.  [name] fixes Travis pill boxes and helps with groceries as he lives alone.  Continues to use rolling walker.     ROS: All systems negative except as listed in HPI, PMH and Problem List.  Past Medical History  Diagnosis Date  . Chronic diastolic heart failure     secondary to diastolic dysfunction EF (previous EF 35-45%)ef 60%4/09  . Arrhythmia     atrial fibrillation /pt on amiodarone,thought to be poor coumadin  . Stroke   . Other and unspecified hyperlipidemia   . Hypertension   . Hypertrophy of prostate without urinary obstruction and other lower urinary tract symptoms (LUTS)   . Osteoarthrosis, unspecified whether generalized or localized, unspecified site   . Type II or unspecified type diabetes mellitus without mention of complication, not stated as uncontrolled   . AV block, Mobitz 1     Intolerance to ACE's / discontiuation of beta blockers  . Obstructive sleep apnea   . Iron deficiency anemia, unspecified     s/p EGD and coloscopy 3/10.gastritis.hemorrhids  . Cerebrovascular accident   . Renal insufficiency   . Obesity   . Allergic rhinitis, cause unspecified 05/29/2010  . Prostate cancer   . CAD (coronary artery disease)     cath 12/04: mLAD  50-60%, mCFX 20-30%, pRCA 50-60%, mRCA 40%    Current Outpatient Prescriptions  Medication Sig Dispense Refill  . allopurinol (ZYLOPRIM) 100 MG tablet Take 100 mg by mouth at bedtime.      Marland Kitchen amiodarone (PACERONE) 200 MG tablet Take 100 mg by mouth every morning.      Marland Kitchen amLODipine (NORVASC) 10 MG tablet Take 10 mg by mouth daily.      Marland Kitchen aspirin 325 MG tablet Take 1 tablet (325 mg total) by mouth daily.  30 tablet  6  . atorvastatin (LIPITOR) 40 MG tablet Take 1 tablet (40 mg total) by mouth daily.  30 tablet  6  . citalopram (CELEXA) 10 MG tablet TAKE 2 TABLETS BY MOUTH EVERY DAY  60 tablet  5  . cloNIDine (CATAPRES - DOSED IN MG/24 HR) 0.1 mg/24hr patch Place 1 patch onto the skin once a week. Change on sundays      . Fe Fum-Vit C-Vit B12-FA (TRIGELS-F) 460-60-0.01-1 MG CAPS Take 1 capsule by mouth daily.  30 capsule  6  . glucose blood (ONE TOUCH ULTRA TEST) test strip Use as directed three times daily dx 250.02  100 each  3  . hydrALAZINE (APRESOLINE) 100 MG tablet Take 1 tablet (100 mg total) by mouth 3 (three) times daily.  90 tablet  3  . insulin detemir (LEVEMIR) 100 UNIT/ML injection Inject 100 Units into the skin daily.  30 mL  11  . isosorbide mononitrate (IMDUR)  60 MG 24 hr tablet Take 60 mg by mouth daily.      Marland Kitchen levothyroxine (SYNTHROID, LEVOTHROID) 125 MCG tablet TAKE 1 TABLET BY MOUTH DAILY.  30 tablet  2  . omeprazole (PRILOSEC) 20 MG capsule TAKE 1 CAPSULE BY MOUTH EVERY DAY  30 capsule  2  . potassium chloride SA (K-DUR,KLOR-CON) 20 MEQ tablet Take 1 tablet (20 mEq total) by mouth daily.  30 tablet  6  . torsemide (DEMADEX) 20 MG tablet Take 2 tablets (40 mg total) by mouth 2 (two) times daily.  120 tablet  6     PHYSICAL EXAM: Filed Vitals:   02/06/12 1528  BP: 146/70  Pulse: 70  Weight: 234 lb 8 oz (106.369 kg)  SpO2: 95%   General:  Elderly male. no resp difficulty. walks with rolling walker today  HEENT: normal except for facial droop Neck: supple. JVP 8-9.   Carotids 2+ bilat; no bruits.  Cor: PMI nondisplaced. RRR.  Soft S4.  Lungs: CTA  Abdomen: obese .soft, nontender. Mild distention, Good bowel sounds. Extremities: no cyanosis, clubbing, rash. RLE and LLE 1+ edema    Neuro: alert & orientedx3, cranial nerves grossly intact except for facial droop. moves all 4 extremities w/o difficulty. affect pleasant     ASSESSMENT & PLAN:

## 2012-02-06 NOTE — Patient Instructions (Addendum)
If weight hits 225 pounds call the office.  Follow up 1 month with Dr. Gala Romney  Do the following things EVERYDAY: 1) Weigh yourself in the morning before breakfast. Write it down and keep it in a log. 2) Take your medicines as prescribed 3) Eat low salt foods-Limit salt (sodium) to 2000 mg per day.  4) Stay as active as you can everyday 5) Limit all fluids for the day to less than 2 liters

## 2012-02-09 NOTE — Assessment & Plan Note (Signed)
Volume status improving on torsemide 40 mg BID.  Will continue current regimen.  Will have him call if weight goes up to 224 pounds or more.  Have encouraged him to continue daily weights and low sodium diet.  BMET today after increase in torsemide.

## 2012-02-15 ENCOUNTER — Other Ambulatory Visit: Payer: Self-pay | Admitting: Internal Medicine

## 2012-02-20 DIAGNOSIS — I509 Heart failure, unspecified: Secondary | ICD-10-CM

## 2012-02-29 ENCOUNTER — Other Ambulatory Visit: Payer: Self-pay | Admitting: Internal Medicine

## 2012-03-06 ENCOUNTER — Encounter (HOSPITAL_COMMUNITY): Payer: Self-pay

## 2012-03-06 ENCOUNTER — Ambulatory Visit (HOSPITAL_COMMUNITY)
Admission: RE | Admit: 2012-03-06 | Discharge: 2012-03-06 | Disposition: A | Discharge status: Home / Self Care | Payer: Medicare PPO | Source: Ambulatory Visit | Attending: Internal Medicine | Admitting: Internal Medicine

## 2012-03-06 VITALS — BP 132/66 | HR 81 | Wt 234.0 lb

## 2012-03-06 DIAGNOSIS — I10 Essential (primary) hypertension: Secondary | ICD-10-CM

## 2012-03-06 DIAGNOSIS — I129 Hypertensive chronic kidney disease with stage 1 through stage 4 chronic kidney disease, or unspecified chronic kidney disease: Secondary | ICD-10-CM | POA: Insufficient documentation

## 2012-03-06 DIAGNOSIS — I5032 Chronic diastolic (congestive) heart failure: Secondary | ICD-10-CM | POA: Insufficient documentation

## 2012-03-06 DIAGNOSIS — N183 Chronic kidney disease, stage 3 unspecified: Secondary | ICD-10-CM | POA: Insufficient documentation

## 2012-03-06 NOTE — Patient Instructions (Addendum)
Increase torsemide 3 tabs in the morning and 2 tabs in the afternoon Friday, Saturday, and Sunday.    Would like weight 219-221 pounds at home.  Follow up 1 month.

## 2012-03-06 NOTE — Progress Notes (Signed)
HPI:  [name] is a very pleasant 79 year old male with  a history of diastolic heart failure as well as 2nd degree heart block (Wenckebach), hypertension,  hyperlipidemia, diabetes, previous CVA, and paroxysmal atrial fibrillation on amiodarone. Not on coumadin as felt to be high risk of bleeding due to h/o ETOH. Echo 3/11 EF 60-65% mild MR.   Discharged from [facility] 01/15/12 after being diuresed. Diuresed 7 pounds. Discharge weight 227 pounds.   He returns for follow up today with his daughter.  He feels well today.  Walked back with rolling walker.  Weight at home up just a little to 222-223 pounds.  He has noted a mild increase in SOB.  He denies dizziness/syncope.  He continues to go to the senior center almost every morning to do exercises which includes stretches and light exercises.    His daughter fixes his pill boxes and helps with groceries as he lives alone.    ROS: All systems negative except as listed in HPI, PMH and Problem List.  Past Medical History  Diagnosis Date  . Chronic diastolic heart failure     secondary to diastolic dysfunction EF (previous EF 35-45%)ef 60%4/09  . Arrhythmia     atrial fibrillation /pt on amiodarone,thought to be poor coumadin  . Stroke   . Other and unspecified hyperlipidemia   . Hypertension   . Hypertrophy of prostate without urinary obstruction and other lower urinary tract symptoms (LUTS)   . Osteoarthrosis, unspecified whether generalized or localized, unspecified site   . Type II or unspecified type diabetes mellitus without mention of complication, not stated as uncontrolled   . AV block, Mobitz 1     Intolerance to ACE's / discontiuation of beta blockers  . Obstructive sleep apnea   . Iron deficiency anemia, unspecified     s/p EGD and coloscopy 3/10.gastritis.hemorrhids  . Cerebrovascular accident   . Renal insufficiency   . Obesity   . Allergic rhinitis, cause unspecified 05/29/2010  . Prostate cancer   . CAD (coronary artery  disease)     cath 12/04: mLAD 50-60%, mCFX 20-30%, pRCA 50-60%, mRCA 40%    Current Outpatient Prescriptions  Medication Sig Dispense Refill  . allopurinol (ZYLOPRIM) 100 MG tablet Take 100 mg by mouth at bedtime.      Marland Kitchen amiodarone (PACERONE) 200 MG tablet Take 100 mg by mouth every morning.      Marland Kitchen amLODipine (NORVASC) 10 MG tablet Take 10 mg by mouth daily.      Marland Kitchen aspirin 325 MG tablet Take 1 tablet (325 mg total) by mouth daily.  30 tablet  6  . atorvastatin (LIPITOR) 40 MG tablet Take 1 tablet (40 mg total) by mouth daily.  30 tablet  6  . citalopram (CELEXA) 10 MG tablet TAKE 2 TABLETS BY MOUTH EVERY DAY  60 tablet  5  . cloNIDine (CATAPRES - DOSED IN MG/24 HR) 0.1 mg/24hr patch Place 1 patch onto the skin once a week. Change on sundays      . Fe Fum-Vit C-Vit B12-FA (TRIGELS-F) 460-60-0.01-1 MG CAPS Take 1 capsule by mouth daily.  30 capsule  6  . glucose blood (ONE TOUCH ULTRA TEST) test strip Use as directed three times daily dx 250.02  100 each  3  . hydrALAZINE (APRESOLINE) 100 MG tablet Take 1 tablet (100 mg total) by mouth 3 (three) times daily.  90 tablet  3  . hydrALAZINE (APRESOLINE) 50 MG tablet TAKE 1 TABLET BY MOUTH 3 TIMES A DAY  90 tablet  0  . insulin detemir (LEVEMIR) 100 UNIT/ML injection Inject 100 Units into the skin daily.  30 mL  11  . isosorbide mononitrate (IMDUR) 60 MG 24 hr tablet Take 60 mg by mouth daily.      Marland Kitchen levothyroxine (SYNTHROID, LEVOTHROID) 125 MCG tablet TAKE 1 TABLET BY MOUTH DAILY.  30 tablet  2  . levothyroxine (SYNTHROID, LEVOTHROID) 125 MCG tablet TAKE 1 TABLET BY MOUTH DAILY.  30 tablet  0  . omeprazole (PRILOSEC) 20 MG capsule TAKE 1 CAPSULE BY MOUTH EVERY DAY  30 capsule  2  . potassium chloride SA (K-DUR,KLOR-CON) 20 MEQ tablet Take 1 tablet (20 mEq total) by mouth daily.  30 tablet  6  . torsemide (DEMADEX) 20 MG tablet Take 2 tablets (40 mg total) by mouth 2 (two) times daily.  120 tablet  6     PHYSICAL EXAM: Filed Vitals:   03/06/12  1605  BP: 132/66  Pulse: 81  Weight: 234 lb (106.142 kg)  SpO2: 98%   General:  Elderly male. no resp difficulty. walks with rolling walker today  HEENT: normal except for facial droop Neck: supple. JVP 8-9.  Carotids 2+ bilat; no bruits.  Cor: PMI nondisplaced. RRR.  Soft S4.  Lungs: CTA  Abdomen: obese .soft, nontender. Mild distention, Good bowel sounds. Extremities: no cyanosis, clubbing, rash. RLE and LLE 1+ edema    Neuro: alert & orientedx3, cranial nerves grossly intact except for facial droop. moves all 4 extremities w/o difficulty. affect pleasant     ASSESSMENT & PLAN:

## 2012-03-07 ENCOUNTER — Other Ambulatory Visit: Payer: Self-pay | Admitting: Internal Medicine

## 2012-03-07 NOTE — Assessment & Plan Note (Addendum)
Weight up a couple of pounds.  Will increase torsemide 60/40 mg over the weekend.  Would like weight 219-221 pounds.  He voices understanding.  Re-educated on fluid restrictions and low sodium diet.  Will have him check labs in one week after increase in torsemide.

## 2012-03-07 NOTE — Assessment & Plan Note (Signed)
Has been stable.  Will repeat labs in 1 week.

## 2012-03-07 NOTE — Assessment & Plan Note (Signed)
Controlled, continue current therapy

## 2012-03-20 ENCOUNTER — Other Ambulatory Visit (INDEPENDENT_AMBULATORY_CARE_PROVIDER_SITE_OTHER): Payer: Medicare Other

## 2012-03-20 DIAGNOSIS — I5032 Chronic diastolic (congestive) heart failure: Secondary | ICD-10-CM

## 2012-03-20 LAB — BASIC METABOLIC PANEL
BUN: 47 mg/dL — ABNORMAL HIGH (ref 6–23)
Chloride: 102 mEq/L (ref 96–112)
GFR: 35.4 mL/min — ABNORMAL LOW (ref >60.00)
Potassium: 3.9 mEq/L (ref 3.5–5.1)
Sodium: 137 mEq/L (ref 135–145)

## 2012-04-10 ENCOUNTER — Encounter (HOSPITAL_COMMUNITY): Payer: Medicare Other

## 2012-04-10 ENCOUNTER — Other Ambulatory Visit (HOSPITAL_COMMUNITY): Payer: Self-pay | Admitting: Cardiology

## 2012-04-10 DIAGNOSIS — I5033 Acute on chronic diastolic (congestive) heart failure: Secondary | ICD-10-CM

## 2012-04-10 MED ORDER — AMIODARONE HCL 200 MG PO TABS: 100.0000 mg | ORAL_TABLET | Freq: Every morning | ORAL | Status: DC

## 2012-04-10 NOTE — Telephone Encounter (Signed)
Request refill on meds

## 2012-04-14 ENCOUNTER — Encounter: Payer: Self-pay | Admitting: Gastroenterology

## 2012-04-23 ENCOUNTER — Other Ambulatory Visit: Payer: Self-pay | Admitting: *Deleted

## 2012-04-23 ENCOUNTER — Telehealth (HOSPITAL_COMMUNITY): Payer: Self-pay | Admitting: Cardiology

## 2012-04-23 MED ORDER — ALLOPURINOL 100 MG PO TABS: 100.0000 mg | ORAL_TABLET | Freq: Every day | ORAL | Status: DC

## 2012-04-23 NOTE — Telephone Encounter (Signed)
Pt request refills

## 2012-05-06 ENCOUNTER — Other Ambulatory Visit: Payer: Self-pay | Admitting: *Deleted

## 2012-05-06 MED ORDER — CLONIDINE HCL 0.1 MG/24HR TD PTWK: 1.0000 | MEDICATED_PATCH | TRANSDERMAL | Status: DC

## 2012-05-07 ENCOUNTER — Other Ambulatory Visit (HOSPITAL_COMMUNITY): Payer: Self-pay | Admitting: Adult Health

## 2012-05-15 ENCOUNTER — Other Ambulatory Visit: Payer: Self-pay | Admitting: Internal Medicine

## 2012-05-19 ENCOUNTER — Other Ambulatory Visit: Payer: Self-pay | Admitting: Internal Medicine

## 2012-05-22 ENCOUNTER — Other Ambulatory Visit: Payer: Self-pay | Admitting: Internal Medicine

## 2012-05-28 ENCOUNTER — Telehealth (HOSPITAL_COMMUNITY): Payer: Self-pay | Admitting: Cardiology

## 2012-05-28 NOTE — Telephone Encounter (Signed)
April called with concerns of LE edema L-2+ R-1+ edema Denies SOB, lungs are clear after coughing (pt has not be feeling well so he does have some mucus/gunk) Unsure about weights- pt states his weight is stable at 222 lbs. April is unable to confirm this as pt does not weigh himself consistently. She has stressed how important it is to weighdaily and write them down    Please advise

## 2012-05-29 ENCOUNTER — Telehealth: Payer: Self-pay

## 2012-05-29 ENCOUNTER — Other Ambulatory Visit: Payer: Self-pay | Admitting: Internal Medicine

## 2012-05-29 DIAGNOSIS — IMO0001 Reserved for inherently not codable concepts without codable children: Secondary | ICD-10-CM

## 2012-05-29 NOTE — Telephone Encounter (Signed)
Caller: [name]/Other; Phone: [phone]; Reason for Call: Daughter calling to request refill for Levothyroxine 125 mcg tablets; takes one daily.  Uses [facility] on [address]  [phone].  Last office visit with PCP 11/26/12.  Thank you.

## 2012-05-29 NOTE — Telephone Encounter (Signed)
Called HHRN left detailed message of MD instructions. 

## 2012-05-29 NOTE — Telephone Encounter (Signed)
Given his high dose tx, I would like to refer to endo for help with this  Referral done

## 2012-05-29 NOTE — Telephone Encounter (Signed)
HHRN called to report trend of elevated blood sugars in this pt - 200-300+.  HHRN is requesting advisement from PCP, pt is compliant with insulin.

## 2012-05-29 NOTE — Telephone Encounter (Signed)
Spoke w/April and discussed w/Nicki Elige Radon, PA advised pt to take Mucinex OTC and increase Torsemide to 60mg  AM and 40 mg PM for 3 days then back to 40 mg bid

## 2012-06-05 ENCOUNTER — Ambulatory Visit
Admission: RE | Admit: 2012-06-05 | Discharge: 2012-06-05 | Disposition: A | Discharge status: Home / Self Care | Payer: Medicare PPO | Source: Ambulatory Visit | Attending: Endocrinology | Admitting: Endocrinology

## 2012-06-05 ENCOUNTER — Ambulatory Visit: Payer: Medicare PPO | Admitting: Endocrinology

## 2012-06-05 ENCOUNTER — Ambulatory Visit (INDEPENDENT_AMBULATORY_CARE_PROVIDER_SITE_OTHER): Payer: Medicare PPO | Admitting: Endocrinology

## 2012-06-05 ENCOUNTER — Encounter: Payer: Self-pay | Admitting: Endocrinology

## 2012-06-05 VITALS — BP 128/80 | HR 78 | Wt 240.0 lb

## 2012-06-05 DIAGNOSIS — IMO0001 Reserved for inherently not codable concepts without codable children: Secondary | ICD-10-CM

## 2012-06-05 DIAGNOSIS — R059 Cough, unspecified: Secondary | ICD-10-CM

## 2012-06-05 DIAGNOSIS — R05 Cough: Secondary | ICD-10-CM

## 2012-06-05 MED ORDER — FLUTICASONE-SALMETEROL 100-50 MCG/DOSE IN AEPB: 1.0000 | INHALATION_SPRAY | Freq: Two times a day (BID) | RESPIRATORY_TRACT | Status: DC

## 2012-06-05 MED ORDER — METFORMIN HCL ER (MOD) 500 MG PO TB24: 500.0000 mg | ORAL_TABLET | Freq: Every day | ORAL | Status: DC

## 2012-06-05 MED ORDER — INSULIN DETEMIR 100 UNIT/ML ~~LOC~~ SOLN: 125.0000 [IU] | SUBCUTANEOUS | Status: DC

## 2012-06-05 MED ORDER — CEFUROXIME AXETIL 250 MG PO TABS: 250.0000 mg | ORAL_TABLET | Freq: Two times a day (BID) | ORAL | Status: AC

## 2012-06-05 NOTE — Progress Notes (Signed)
Subjective:    Patient ID: [name], male    DOB: [birth year], 79 y.o.   MRN: [id number]  HPI pt states 22 years h/o dm.  He feels this was related to alcoholism.  It is complicated by CVA, renal failure, peripheral sensory neuropathy, and CAD.  he has been on insulin x 15 years.  pt says his diet and exercise are "fair."  He reports cbg's are consistently in the 200's.   He reports few years of moderate numbness of the feet, but no assoc pain. He reports few days of nasal congestion, and assoc prod cough. He also has wheezing.     Past Medical History  Diagnosis Date  . Chronic diastolic heart failure     secondary to diastolic dysfunction EF (previous EF 35-45%)ef 60%4/09  . Arrhythmia     atrial fibrillation /pt on amiodarone,thought to be poor coumadin  . Stroke   . Other and unspecified hyperlipidemia   . Hypertension   . Hypertrophy of prostate without urinary obstruction and other lower urinary tract symptoms (LUTS)   . Osteoarthrosis, unspecified whether generalized or localized, unspecified site   . Type II or unspecified type diabetes mellitus without mention of complication, not stated as uncontrolled   . AV block, Mobitz 1     Intolerance to ACE's / discontiuation of beta blockers  . Obstructive sleep apnea   . Iron deficiency anemia, unspecified     s/p EGD and coloscopy 3/10.gastritis.hemorrhids  . Cerebrovascular accident   . Renal insufficiency   . Obesity   . Allergic rhinitis, cause unspecified 05/29/2010  . Prostate cancer   . CAD (coronary artery disease)     cath 12/04: mLAD 50-60%, mCFX 20-30%, pRCA 50-60%, mRCA 40%    Past Surgical History  Procedure Laterality Date  . Prostate surgery    . Prostate surgury      hx of    History   Social History  . Marital Status: Divorced    Spouse Name: N/A    Number of Children: N/A  . Years of Education: N/A   Occupational History  . Not on file.   Social History Main Topics  . Smoking status:  Former Games developer  . Smokeless tobacco: Not on file  . Alcohol Use: No     Comment: history of abuse  . Drug Use:   . Sexually Active:    Other Topics Concern  . Not on file   Social History Narrative   Regular exercise - no ambulates w/cane or walker    Current Outpatient Prescriptions on File Prior to Visit  Medication Sig Dispense Refill  . allopurinol (ZYLOPRIM) 100 MG tablet Take 1 tablet (100 mg total) by mouth at bedtime.  30 tablet  30  . amiodarone (PACERONE) 200 MG tablet Take 0.5 tablets (100 mg total) by mouth every morning.  15 tablet  3  . amLODipine (NORVASC) 10 MG tablet Take 10 mg by mouth daily.      Marland Kitchen aspirin 325 MG tablet Take 1 tablet (325 mg total) by mouth daily.  30 tablet  6  . atorvastatin (LIPITOR) 40 MG tablet Take 1 tablet (40 mg total) by mouth daily.  30 tablet  6  . B-D ULTRAFINE III SHORT PEN 31G X 8 MM MISC USE AS DIRECTED  100 each  3  . citalopram (CELEXA) 10 MG tablet TAKE 2 TABLETS BY MOUTH EVERY DAY  60 tablet  5  . cloNIDine (CATAPRES - DOSED IN MG/24 HR)  0.1 mg/24hr patch Place 1 patch (0.1 mg total) onto the skin once a week. Change on sundays  4 patch  6  . Fe Fum-Vit C-Vit B12-FA (TRIGELS-F) 460-60-0.01-1 MG CAPS Take 1 capsule by mouth daily.  30 capsule  6  . glucose blood (ONE TOUCH ULTRA TEST) test strip Use as directed three times daily dx 250.02  100 each  3  . hydrALAZINE (APRESOLINE) 100 MG tablet TAKE 1 TABLET BY MOUTH 3 TIMES DAILY.  90 tablet  6  . hydrALAZINE (APRESOLINE) 50 MG tablet TAKE 1 TABLET BY MOUTH 3 TIMES A DAY  90 tablet  0  . isosorbide mononitrate (IMDUR) 60 MG 24 hr tablet Take 60 mg by mouth daily.      Marland Kitchen LEVEMIR FLEXPEN 100 UNIT/ML injection INJECT 100 UNITS INTO THE SKIN DAILY.  30 mL  11  . levothyroxine (SYNTHROID, LEVOTHROID) 125 MCG tablet TAKE 1 TABLET BY MOUTH DAILY.  30 tablet  0  . levothyroxine (SYNTHROID, LEVOTHROID) 125 MCG tablet TAKE 1 TABLET BY MOUTH ONCE EVERY DAY  30 tablet  2  . omeprazole (PRILOSEC)  20 MG capsule TAKE 1 CAPSULE BY MOUTH EVERY DAY  30 capsule  2  . potassium chloride SA (K-DUR,KLOR-CON) 20 MEQ tablet Take 1 tablet (20 mEq total) by mouth daily.  30 tablet  6  . torsemide (DEMADEX) 20 MG tablet Take 2 tablets (40 mg total) by mouth 2 (two) times daily.  120 tablet  6   No current facility-administered medications on file prior to visit.    Allergies  Allergen Reactions  . Ace Inhibitors Other (See Comments)    me  hyposion  . Beta Adrenergic Blockers Other (See Comments)     use cautiously secondary to 2nd degree heart block, hypotension   Family History  Problem Relation Age of Onset  . Heart disease Father   DM: none  BP 128/80  Pulse 78  Wt 240 lb (108.863 kg)  BMI 34.44 kg/m2  SpO2 98%  Review of Systems denies blurry vision, headache, chest pain, sob, n/v, urinary frequency, cramps, excessive diaphoresis, memory loss, depression, hypoglycemia, rhinorrhea, and easy bruising.  He reports weight gain. Her attributes polyuria to diuretics.     Objective:   Physical Exam VS: see vs page GEN: no distress HEAD: head: no deformity eyes: no periorbital swelling, no proptosis external nose and ears are normal mouth: no lesion seen Both eac's and tm's are normal NECK: supple, thyroid is not enlarged CHEST WALL: no deformity LUNGS: clear to auscultation, except for a few rhonchi. BREASTS:  No gynecomastia CV: reg rate and rhythm, no murmur ABD: abdomen is soft, nontender.  no hepatosplenomegaly.  not distended.  no hernia MUSCULOSKELETAL: muscle bulk and strength are grossly normal.  no obvious joint swelling.  gait is steady with a walker EXTEMITIES: no deformity.  no ulcer on the feet.  feet are of normal color and temp.  1+ bilat edema.  There is bilateral onychomycosis.   PULSES: dorsalis pedis intact bilat.  no carotid bruit NEURO:  cn 2-12 grossly intact.   readily moves all 4's.  sensation is intact to touch on the feet, but decreased from normal.    SKIN:  Normal texture and temperature.  No rash or suspicious lesion is visible.   NODES:  None palpable at the neck PSYCH: alert, oriented x3.  Does not appear anxious nor depressed.  Lab Results  Component Value Date   HGBA1C 10.0* 09/22/2010  Assessment & Plan:  DM: needs increased rx Alcoholism.  Uncertain relationship between this and dm (as he does not have any evidence of pancreatic exocrine insufficiency).   Numbness of the feet, prob due to DM. [place] Peers, new

## 2012-06-05 NOTE — Patient Instructions (Addendum)
good diet and exercise habits significanly improve the control of your diabetes.  please let me know if you wish to be referred to a dietician.  high blood sugar is very risky to your health.  you should see an eye doctor every year.  You are at higher than average risk for pneumonia and hepatitis-B.  You should be vaccinated against both.   controlling your blood pressure and cholesterol drastically reduces the damage diabetes does to your body.  this also applies to quitting smoking.  please discuss these with your doctor.  you should take an aspirin every day, unless you have been advised by a doctor not to. check your blood sugar twice a day.  vary the time of day when you check, between before the 3 meals, and at bedtime.  also check if you have symptoms of your blood sugar being too high or too low.  please keep a record of the readings and bring it to your next appointment here.  please call us sooner if your blood sugar goes below 70, or if you have a lot of readings over 200. i have sent 2 prescriptions to your pharmacy: antibiotic and an inhaler.  Loratadine-d (non-prescription) will help your congestion.  A chest x-ray is requested for you today.  We'll contact you with results. I hope you feel better soon.  If you don't feel better by next week, please call back.  Please call sooner if you get worse.   Please increase the insulin to 125 units each morning.   Please come back for a follow-up appointment in 2 weeks.

## 2012-06-10 ENCOUNTER — Encounter: Payer: Self-pay | Admitting: Internal Medicine

## 2012-06-11 ENCOUNTER — Other Ambulatory Visit: Payer: Self-pay | Admitting: Internal Medicine

## 2012-06-19 ENCOUNTER — Other Ambulatory Visit: Payer: Self-pay | Admitting: Internal Medicine

## 2012-06-23 ENCOUNTER — Other Ambulatory Visit: Payer: Self-pay | Admitting: Internal Medicine

## 2012-06-24 ENCOUNTER — Telehealth: Payer: Self-pay | Admitting: Internal Medicine

## 2012-06-24 NOTE — Telephone Encounter (Signed)
OK for UC or ER asap if bleeding or swelling worse, or fever, or worsening ability to walk, or other unsuual symptoms such as dizziness or CP, SOB; o/w ok to see next available

## 2012-06-24 NOTE — Telephone Encounter (Signed)
Pt's daughter called stated that [name] hit his leg and now it is swollen. Pt is diabetic and the daughter is very concern about this. No appt open to tomorrow and last ov was 11/2010. Please advise.

## 2012-06-25 ENCOUNTER — Ambulatory Visit (HOSPITAL_COMMUNITY)
Admission: RE | Admit: 2012-06-25 | Discharge: 2012-06-25 | Disposition: A | Discharge status: Home / Self Care | Payer: Medicare PPO | Source: Ambulatory Visit | Attending: Internal Medicine | Admitting: Internal Medicine

## 2012-06-25 VITALS — BP 152/68 | HR 76 | Wt 239.0 lb

## 2012-06-25 DIAGNOSIS — I5032 Chronic diastolic (congestive) heart failure: Secondary | ICD-10-CM | POA: Insufficient documentation

## 2012-06-25 LAB — BASIC METABOLIC PANEL
CO2: 24 mEq/L (ref 19–32)
Chloride: 100 mEq/L (ref 96–112)
Glucose, Bld: 113 mg/dL — ABNORMAL HIGH (ref 70–99)
Potassium: 4.1 mEq/L (ref 3.5–5.1)
Sodium: 136 mEq/L (ref 135–145)

## 2012-06-25 MED ORDER — OMEPRAZOLE 20 MG PO CPDR: 20.0000 mg | DELAYED_RELEASE_CAPSULE | Freq: Every day | ORAL | Status: DC

## 2012-06-25 NOTE — Telephone Encounter (Signed)
Called left message to call back 

## 2012-06-25 NOTE — Patient Instructions (Addendum)
Take metolazone 0.5 tab today and tomorrow.  Increase potassium to 1 tab twice daily.  Continue torsemide 3 tabs twice daily.  Follow up 2 weeks.   Labs today.

## 2012-06-25 NOTE — Assessment & Plan Note (Signed)
Renal function has been stable with Cr 1.9-2.3.  Will check BMET today with increased diuresis.

## 2012-06-25 NOTE — Progress Notes (Signed)
HPI:  [name] is a very pleasant 79 year old male with  a history of diastolic heart failure as well as 2nd degree heart block (Wenckebach), hypertension,  hyperlipidemia, diabetes, previous CVA, and paroxysmal atrial fibrillation on amiodarone. Not on coumadin as felt to be high risk of bleeding due to h/o ETOH. Echo 3/11 EF 60-65% mild MR.   Discharged from [facility] 01/15/12 after being diuresed. Diuresed 7 pounds. Discharge weight 227 pounds.  He is here for work in visit today due to fluid increased and dyspnea.  Over the last week or so he has noted increased lower extremity edema.  His daughter increased his torsemide to 60 mg BID starting yesterday.  After discussion we have determined this is due to eating high salt foods at the senior center as well as increased fluid intake due to dry mouth.  He says over the last 2 days he is up 2 pounds on his scale to 240.  His daughter continues to fix pill boxes and picks up foods from the grocery store as he lives alone.    ROS: All systems negative except as listed in HPI, PMH and Problem List.  Past Medical History  Diagnosis Date  . Chronic diastolic heart failure     secondary to diastolic dysfunction EF (previous EF 35-45%)ef 60%4/09  . Arrhythmia     atrial fibrillation /pt on amiodarone,thought to be poor coumadin  . Stroke   . Other and unspecified hyperlipidemia   . Hypertension   . Hypertrophy of prostate without urinary obstruction and other lower urinary tract symptoms (LUTS)   . Osteoarthrosis, unspecified whether generalized or localized, unspecified site   . Type II or unspecified type diabetes mellitus without mention of complication, not stated as uncontrolled   . AV block, Mobitz 1     Intolerance to ACE's / discontiuation of beta blockers  . Obstructive sleep apnea   . Iron deficiency anemia, unspecified     s/p EGD and coloscopy 3/10.gastritis.hemorrhids  . Cerebrovascular accident   . Renal insufficiency   . Obesity    . Allergic rhinitis, cause unspecified 05/29/2010  . Prostate cancer   . CAD (coronary artery disease)     cath 12/04: mLAD 50-60%, mCFX 20-30%, pRCA 50-60%, mRCA 40%    Current Outpatient Prescriptions  Medication Sig Dispense Refill  . allopurinol (ZYLOPRIM) 100 MG tablet Take 1 tablet (100 mg total) by mouth at bedtime.  30 tablet  30  . amiodarone (PACERONE) 200 MG tablet Take 0.5 tablets (100 mg total) by mouth every morning.  15 tablet  3  . amLODipine (NORVASC) 10 MG tablet Take 10 mg by mouth daily.      Marland Kitchen aspirin 325 MG tablet Take 1 tablet (325 mg total) by mouth daily.  30 tablet  6  . atorvastatin (LIPITOR) 40 MG tablet Take 1 tablet (40 mg total) by mouth daily.  30 tablet  6  . B-D ULTRAFINE III SHORT PEN 31G X 8 MM MISC USE AS DIRECTED  100 each  3  . citalopram (CELEXA) 10 MG tablet TAKE 2 TABLETS BY MOUTH EVERY DAY  60 tablet  5  . cloNIDine (CATAPRES - DOSED IN MG/24 HR) 0.1 mg/24hr patch Place 1 patch (0.1 mg total) onto the skin once a week. Change on sundays  4 patch  6  . Fe Fum-Vit C-Vit B12-FA (TRIGELS-F) 460-60-0.01-1 MG CAPS Take 1 capsule by mouth daily.  30 capsule  6  . Fluticasone-Salmeterol (ADVAIR DISKUS) 100-50 MCG/DOSE AEPB Inhale  1 puff into the lungs 2 (two) times daily.  1 each  0  . glucose blood (ONE TOUCH ULTRA TEST) test strip Use as directed three times daily dx 250.02  100 each  3  . hydrALAZINE (APRESOLINE) 100 MG tablet TAKE 1 TABLET BY MOUTH 3 TIMES DAILY.  90 tablet  6  . insulin detemir (LEVEMIR FLEXPEN) 100 UNIT/ML injection Inject 1.25 mLs (125 Units total) into the skin every morning. And pen needles 1/day  45 mL  12  . isosorbide mononitrate (IMDUR) 60 MG 24 hr tablet Take 60 mg by mouth daily.      Marland Kitchen levothyroxine (SYNTHROID, LEVOTHROID) 125 MCG tablet TAKE 1 TABLET BY MOUTH DAILY.  30 tablet  0  . levothyroxine (SYNTHROID, LEVOTHROID) 125 MCG tablet TAKE 1 TABLET BY MOUTH ONCE EVERY DAY  30 tablet  2  . omeprazole (PRILOSEC) 20 MG capsule  Take 1 capsule (20 mg total) by mouth daily.  30 capsule  11  . potassium chloride SA (K-DUR,KLOR-CON) 20 MEQ tablet Take 1 tablet (20 mEq total) by mouth daily.  30 tablet  6  . torsemide (DEMADEX) 20 MG tablet Take 2 tablets (40 mg total) by mouth 2 (two) times daily.  120 tablet  6   No current facility-administered medications for this encounter.     PHYSICAL EXAM: Filed Vitals:   06/25/12 1524  BP: 152/68  Pulse: 76  Weight: 239 lb (108.41 kg)  SpO2: 96%   General:  Elderly male. no resp difficulty. walks with rolling walker today  HEENT: normal except for facial droop Neck: supple. JVP 9-10.  Carotids 2+ bilat; no bruits.  Cor: PMI nondisplaced. RRR.  Soft S4.  Lungs: CTA  Abdomen: obese .soft, nontender. Mild distention, Good bowel sounds. Extremities: no cyanosis, clubbing, rash. 2-3+ left lower extremity edema with trace-1+ right LE edema  Neuro: alert & orientedx3, cranial nerves grossly intact except for facial droop. moves all 4 extremities w/o difficulty. affect pleasant     ASSESSMENT & PLAN:

## 2012-06-25 NOTE — Telephone Encounter (Signed)
Daughter informed of MD instructions and agreed to do so.  Also requested a refill on prilosec.

## 2012-06-25 NOTE — Assessment & Plan Note (Signed)
Volume status elevated due to dietary and fluid indiscretions.  Will give metolazone 2.5 mg today and tomorrow and increase torsemide to 60 mg BID as well as potassium to BID.  Have discussed fluid restrictions and low sodium diet choices.  I am unsure if he can work on low sodium diet if he continues to eat at the senior center but he will be able to cut back on fluid intake.  Will follow closely in clinic to ensure fluid status is improving.  BMET today.

## 2012-06-26 ENCOUNTER — Ambulatory Visit: Payer: Medicare PPO | Admitting: Endocrinology

## 2012-06-26 ENCOUNTER — Other Ambulatory Visit: Payer: Self-pay | Admitting: *Deleted

## 2012-06-26 MED ORDER — ATORVASTATIN CALCIUM 40 MG PO TABS: 40.0000 mg | ORAL_TABLET | Freq: Every day | ORAL | Status: DC

## 2012-07-09 ENCOUNTER — Encounter (HOSPITAL_COMMUNITY): Payer: Self-pay

## 2012-07-09 ENCOUNTER — Ambulatory Visit (HOSPITAL_COMMUNITY)
Admission: RE | Admit: 2012-07-09 | Discharge: 2012-07-09 | Disposition: A | Discharge status: Home / Self Care | Payer: Medicare PPO | Source: Ambulatory Visit | Attending: Internal Medicine | Admitting: Internal Medicine

## 2012-07-09 VITALS — BP 142/62 | HR 97 | Resp 22 | Ht 70.0 in | Wt 237.1 lb

## 2012-07-09 DIAGNOSIS — I129 Hypertensive chronic kidney disease with stage 1 through stage 4 chronic kidney disease, or unspecified chronic kidney disease: Secondary | ICD-10-CM | POA: Insufficient documentation

## 2012-07-09 DIAGNOSIS — N183 Chronic kidney disease, stage 3 unspecified: Secondary | ICD-10-CM | POA: Insufficient documentation

## 2012-07-09 DIAGNOSIS — I5032 Chronic diastolic (congestive) heart failure: Secondary | ICD-10-CM

## 2012-07-09 LAB — BASIC METABOLIC PANEL
BUN: 54 mg/dL — ABNORMAL HIGH (ref 6–23)
CO2: 25 mEq/L (ref 19–32)
Chloride: 101 mEq/L (ref 96–112)
GFR calc non Af Amer: 25 mL/min — ABNORMAL LOW (ref >90)
Glucose, Bld: 137 mg/dL — ABNORMAL HIGH (ref 70–99)
Potassium: 4.2 mEq/L (ref 3.5–5.1)
Sodium: 140 mEq/L (ref 135–145)

## 2012-07-09 NOTE — Progress Notes (Signed)
HPI:  [name] is a very pleasant 79 year old male with  a history of diastolic heart failure as well as 2nd degree heart block (Wenckebach), hypertension,  hyperlipidemia, diabetes, previous CVA, and paroxysmal atrial fibrillation on amiodarone. Not on coumadin as felt to be high risk of bleeding due to h/o ETOH. Echo 3/11 EF 60-65% mild MR.   Discharged from [facility] 01/15/12 after being diuresed. Diuresed 7 pounds. Discharge weight 227 pounds.  He returns for follow up today.  He increased his torsemide and took metolazone 2.5 mg for two days.  Lower extremity edema improving.  Says he is not eating at the senior center as much and has but back on his fluid intake.  He denies orthopnea, PND.  No chest pain.  His children help fix pill boxes and grocery shop for him.    ROS: All systems negative except as listed in HPI, PMH and Problem List.  Past Medical History  Diagnosis Date  . Chronic diastolic heart failure     secondary to diastolic dysfunction EF (previous EF 35-45%)ef 60%4/09  . Arrhythmia     atrial fibrillation /pt on amiodarone,thought to be poor coumadin  . Stroke   . Other and unspecified hyperlipidemia   . Hypertension   . Hypertrophy of prostate without urinary obstruction and other lower urinary tract symptoms (LUTS)   . Osteoarthrosis, unspecified whether generalized or localized, unspecified site   . Type II or unspecified type diabetes mellitus without mention of complication, not stated as uncontrolled   . AV block, Mobitz 1     Intolerance to ACE's / discontiuation of beta blockers  . Obstructive sleep apnea   . Iron deficiency anemia, unspecified     s/p EGD and coloscopy 3/10.gastritis.hemorrhids  . Cerebrovascular accident   . Renal insufficiency   . Obesity   . Allergic rhinitis, cause unspecified 05/29/2010  . Prostate cancer   . CAD (coronary artery disease)     cath 12/04: mLAD 50-60%, mCFX 20-30%, pRCA 50-60%, mRCA 40%    Current Outpatient  Prescriptions  Medication Sig Dispense Refill  . allopurinol (ZYLOPRIM) 100 MG tablet Take 1 tablet (100 mg total) by mouth at bedtime.  30 tablet  30  . amiodarone (PACERONE) 200 MG tablet Take 0.5 tablets (100 mg total) by mouth every morning.  15 tablet  3  . amLODipine (NORVASC) 10 MG tablet Take 10 mg by mouth daily.      Marland Kitchen aspirin 325 MG tablet Take 1 tablet (325 mg total) by mouth daily.  30 tablet  6  . atorvastatin (LIPITOR) 40 MG tablet Take 1 tablet (40 mg total) by mouth daily.  30 tablet  1  . B-D ULTRAFINE III SHORT PEN 31G X 8 MM MISC USE AS DIRECTED  100 each  3  . citalopram (CELEXA) 10 MG tablet TAKE 2 TABLETS BY MOUTH EVERY DAY  60 tablet  5  . cloNIDine (CATAPRES - DOSED IN MG/24 HR) 0.1 mg/24hr patch Place 1 patch (0.1 mg total) onto the skin once a week. Change on sundays  4 patch  6  . Fe Fum-Vit C-Vit B12-FA (TRIGELS-F) 460-60-0.01-1 MG CAPS Take 1 capsule by mouth daily.  30 capsule  6  . Fluticasone-Salmeterol (ADVAIR DISKUS) 100-50 MCG/DOSE AEPB Inhale 1 puff into the lungs 2 (two) times daily.  1 each  0  . glucose blood (ONE TOUCH ULTRA TEST) test strip Use as directed three times daily dx 250.02  100 each  3  . hydrALAZINE (APRESOLINE)  100 MG tablet TAKE 1 TABLET BY MOUTH 3 TIMES DAILY.  90 tablet  6  . insulin detemir (LEVEMIR FLEXPEN) 100 UNIT/ML injection Inject 1.25 mLs (125 Units total) into the skin every morning. And pen needles 1/day  45 mL  12  . isosorbide mononitrate (IMDUR) 60 MG 24 hr tablet Take 60 mg by mouth daily.      Marland Kitchen levothyroxine (SYNTHROID, LEVOTHROID) 125 MCG tablet TAKE 1 TABLET BY MOUTH DAILY.  30 tablet  0  . levothyroxine (SYNTHROID, LEVOTHROID) 125 MCG tablet TAKE 1 TABLET BY MOUTH ONCE EVERY DAY  30 tablet  2  . omeprazole (PRILOSEC) 20 MG capsule Take 1 capsule (20 mg total) by mouth daily.  30 capsule  11  . potassium chloride SA (K-DUR,KLOR-CON) 20 MEQ tablet Take 1 tablet (20 mEq total) by mouth daily.  30 tablet  6  . torsemide  (DEMADEX) 20 MG tablet Take 2 tablets (40 mg total) by mouth 2 (two) times daily.  120 tablet  6   No current facility-administered medications for this encounter.     PHYSICAL EXAM: Filed Vitals:   07/09/12 1513  BP: 142/62  Pulse: 97  Resp: 22  Height: 5\' 10"  (1.778 m)  Weight: 237 lb 1.9 oz (107.557 kg)  SpO2: 91%   General:  Elderly male. no resp difficulty. walks with rolling walker today  HEENT: normal except for facial droop Neck: supple. JVP 8-9.  Carotids 2+ bilat; no bruits.  Cor: PMI nondisplaced. RRR.  Soft S4.  Lungs: CTA  Abdomen: obese .soft, nontender. Mild distention, Good bowel sounds. Extremities: no cyanosis, clubbing, rash. 2+ left lower extremity edema with trace right LE edema  Neuro: alert & orientedx3, cranial nerves grossly intact except for facial droop. moves all 4 extremities w/o difficulty. affect pleasant     ASSESSMENT & PLAN:

## 2012-07-09 NOTE — Patient Instructions (Addendum)
Continue current regimen.  Follow up 4-5 weeks.   Do the following things EVERYDAY: 1) Weigh yourself in the morning before breakfast. Write it down and keep it in a log. 2) Take your medicines as prescribed 3) Eat low salt foods-Limit salt (sodium) to 2000 mg per day.  4) Stay as active as you can everyday 5) Limit all fluids for the day to less than 2 liters

## 2012-07-11 NOTE — Assessment & Plan Note (Signed)
Recheck BMET today after diuresis.

## 2012-07-11 NOTE — Assessment & Plan Note (Signed)
Volume status improving with stricter sodium restrictions and fluid restrictions as well as increase diuretic last week.  Will continue with torsemide 40 mg BID for slower diuresis in the setting of renal insufficiency.  Have reinforced sodium restrictions.

## 2012-07-15 ENCOUNTER — Other Ambulatory Visit: Payer: Self-pay | Admitting: Internal Medicine

## 2012-07-16 ENCOUNTER — Other Ambulatory Visit (HOSPITAL_COMMUNITY): Payer: Self-pay | Admitting: *Deleted

## 2012-07-16 MED ORDER — AMLODIPINE BESYLATE 10 MG PO TABS: 10.0000 mg | ORAL_TABLET | Freq: Every day | ORAL | Status: DC

## 2012-07-21 ENCOUNTER — Other Ambulatory Visit (HOSPITAL_COMMUNITY): Payer: Self-pay | Admitting: Internal Medicine

## 2012-07-21 ENCOUNTER — Other Ambulatory Visit (HOSPITAL_COMMUNITY): Payer: Self-pay | Admitting: *Deleted

## 2012-07-22 ENCOUNTER — Other Ambulatory Visit (HOSPITAL_COMMUNITY): Payer: Self-pay | Admitting: *Deleted

## 2012-07-22 MED ORDER — OMEPRAZOLE 20 MG PO CPDR: 20.0000 mg | DELAYED_RELEASE_CAPSULE | Freq: Every day | ORAL | Status: DC

## 2012-07-23 ENCOUNTER — Other Ambulatory Visit (HOSPITAL_COMMUNITY): Payer: Self-pay | Admitting: *Deleted

## 2012-07-23 ENCOUNTER — Other Ambulatory Visit (HOSPITAL_COMMUNITY): Payer: Self-pay | Admitting: Cardiology

## 2012-07-23 ENCOUNTER — Other Ambulatory Visit: Payer: Self-pay | Admitting: Internal Medicine

## 2012-07-23 MED ORDER — ISOSORBIDE MONONITRATE ER 60 MG PO TB24: 60.0000 mg | ORAL_TABLET | Freq: Every day | ORAL | Status: DC

## 2012-07-23 MED ORDER — FE FUM-VIT C-VIT B12-FA 460-60-0.01-1 MG PO CAPS: 1.0000 | ORAL_CAPSULE | Freq: Every day | ORAL | Status: DC

## 2012-07-23 MED ORDER — POTASSIUM CHLORIDE CRYS ER 20 MEQ PO TBCR: 20.0000 meq | EXTENDED_RELEASE_TABLET | Freq: Every day | ORAL | Status: DC

## 2012-07-28 ENCOUNTER — Other Ambulatory Visit (HOSPITAL_COMMUNITY): Payer: Self-pay | Admitting: *Deleted

## 2012-07-28 ENCOUNTER — Other Ambulatory Visit: Payer: Self-pay | Admitting: Internal Medicine

## 2012-07-28 DIAGNOSIS — R0989 Other specified symptoms and signs involving the circulatory and respiratory systems: Secondary | ICD-10-CM

## 2012-07-28 MED ORDER — ISOSORBIDE MONONITRATE ER 60 MG PO TB24: 60.0000 mg | ORAL_TABLET | Freq: Every day | ORAL | Status: DC

## 2012-07-29 ENCOUNTER — Telehealth: Payer: Self-pay | Admitting: Internal Medicine

## 2012-07-29 ENCOUNTER — Other Ambulatory Visit: Payer: Self-pay

## 2012-07-29 MED ORDER — CITALOPRAM HYDROBROMIDE 10 MG PO TABS: 10.0000 mg | ORAL_TABLET | Freq: Every day | ORAL | Status: DC

## 2012-07-29 MED ORDER — CITALOPRAM HYDROBROMIDE 10 MG PO TABS: 10.0000 mg | ORAL_TABLET | Freq: Two times a day (BID) | ORAL | Status: DC

## 2012-07-29 NOTE — Telephone Encounter (Signed)
[facility] called Triage requesting a refill on pts Citalopram.  Please advise

## 2012-07-29 NOTE — Telephone Encounter (Signed)
Ok for 10 qd - to robin to handle

## 2012-07-29 NOTE — Telephone Encounter (Signed)
Called [facility] back advised pt would need to be seen by MD before Citalopram.  Last visit was 11/2010, transferred to scheduler

## 2012-07-29 NOTE — Addendum Note (Signed)
Addended by: Scharlene Gloss B on: 07/29/2012 04:22 PM   Modules accepted: Orders

## 2012-08-12 ENCOUNTER — Encounter: Payer: Self-pay | Admitting: Internal Medicine

## 2012-08-12 ENCOUNTER — Other Ambulatory Visit (INDEPENDENT_AMBULATORY_CARE_PROVIDER_SITE_OTHER): Payer: Medicare PPO

## 2012-08-12 ENCOUNTER — Ambulatory Visit (INDEPENDENT_AMBULATORY_CARE_PROVIDER_SITE_OTHER): Payer: Medicare PPO | Admitting: Internal Medicine

## 2012-08-12 VITALS — BP 160/60 | HR 66 | Temp 97.6°F | Wt 235.5 lb

## 2012-08-12 DIAGNOSIS — I10 Essential (primary) hypertension: Secondary | ICD-10-CM

## 2012-08-12 DIAGNOSIS — Z0000 Encounter for general adult medical examination without abnormal findings: Secondary | ICD-10-CM

## 2012-08-12 DIAGNOSIS — E785 Hyperlipidemia, unspecified: Secondary | ICD-10-CM

## 2012-08-12 LAB — BASIC METABOLIC PANEL
Calcium: 9.4 mg/dL (ref 8.4–10.5)
GFR: 31.84 mL/min — ABNORMAL LOW (ref >60.00)
Sodium: 140 mEq/L (ref 135–145)

## 2012-08-12 LAB — LIPID PANEL
HDL: 35.1 mg/dL — ABNORMAL LOW (ref >39.00)
Total CHOL/HDL Ratio: 3
VLDL: 26.8 mg/dL (ref 0.0–40.0)

## 2012-08-12 LAB — HEPATIC FUNCTION PANEL
Alkaline Phosphatase: 55 U/L (ref 39–117)
Bilirubin, Direct: 0 mg/dL (ref 0.0–0.3)
Total Bilirubin: 0.4 mg/dL (ref 0.3–1.2)

## 2012-08-12 NOTE — Progress Notes (Signed)
Subjective:    Patient ID: [name], male    DOB: 13-May-1932, 79 y.o.   MRN: [id number]  HPI  Here to f/u with family; overall doing ok,  Pt denies chest pain, increased sob or doe, wheezing, orthopnea, PND, increased LE swelling, palpitations, dizziness or syncope.  Pt denies polydipsia, polyuria, or low sugar symptoms such as weakness or confusion improved with po intake.  Pt denies new neurological symptoms such as new headache, or facial or extremity weakness or numbness.   Pt states overall good compliance with meds, has been trying to follow lower cholesterol, diabetic diet, with wt overall stable,  but little exercise however. No acute complaitns.  BP at home < 140/90 per famiy Past Medical History  Diagnosis Date  . Chronic diastolic heart failure     secondary to diastolic dysfunction EF (previous EF 35-45%)ef 60%4/09  . Arrhythmia     atrial fibrillation /pt on amiodarone,thought to be poor coumadin  . Stroke   . Other and unspecified hyperlipidemia   . Hypertension   . Hypertrophy of prostate without urinary obstruction and other lower urinary tract symptoms (LUTS)   . Osteoarthrosis, unspecified whether generalized or localized, unspecified site   . Type II or unspecified type diabetes mellitus without mention of complication, not stated as uncontrolled   . AV block, Mobitz 1     Intolerance to ACE's / discontiuation of beta blockers  . Obstructive sleep apnea   . Iron deficiency anemia, unspecified     s/p EGD and coloscopy 3/10.gastritis.hemorrhids  . Cerebrovascular accident   . Renal insufficiency   . Obesity   . Allergic rhinitis, cause unspecified 05/29/2010  . Prostate cancer   . CAD (coronary artery disease)     cath 12/04: mLAD 50-60%, mCFX 20-30%, pRCA 50-60%, mRCA 40%   Past Surgical History  Procedure Laterality Date  . Prostate surgery    . Prostate surgury      hx of    reports that he has quit smoking. He does not have any smokeless tobacco history  on file. He reports that he does not drink alcohol. His drug history is not on file. family history includes Heart disease in his father. Allergies  Allergen Reactions  . Ace Inhibitors Other (See Comments)    me  hyposion  . Beta Adrenergic Blockers Other (See Comments)     use cautiously secondary to 2nd degree heart block, hypotension   Current Outpatient Prescriptions on File Prior to Visit  Medication Sig Dispense Refill  . allopurinol (ZYLOPRIM) 100 MG tablet Take 1 tablet (100 mg total) by mouth at bedtime.  30 tablet  30  . amLODipine (NORVASC) 10 MG tablet Take 1 tablet (10 mg total) by mouth daily.  30 tablet  6  . aspirin 325 MG tablet Take 1 tablet (325 mg total) by mouth daily.  30 tablet  6  . atorvastatin (LIPITOR) 40 MG tablet Take 1 tablet (40 mg total) by mouth daily.  30 tablet  1  . B-D ULTRAFINE III SHORT PEN 31G X 8 MM MISC USE AS DIRECTED  100 each  3  . citalopram (CELEXA) 10 MG tablet Take 1 tablet (10 mg total) by mouth daily.  30 tablet  11  . cloNIDine (CATAPRES - DOSED IN MG/24 HR) 0.1 mg/24hr patch Place 1 patch (0.1 mg total) onto the skin once a week. Change on sundays  4 patch  6  . Fe Fum-Vit C-Vit B12-FA (TRIGELS-F) 460-60-0.01-1 MG CAPS Take  1 capsule by mouth daily.  30 capsule  6  . Fluticasone-Salmeterol (ADVAIR DISKUS) 100-50 MCG/DOSE AEPB Inhale 1 puff into the lungs 2 (two) times daily.  1 each  0  . hydrALAZINE (APRESOLINE) 100 MG tablet TAKE 1 TABLET BY MOUTH 3 TIMES DAILY.  90 tablet  6  . insulin detemir (LEVEMIR FLEXPEN) 100 UNIT/ML injection Inject 1.25 mLs (125 Units total) into the skin every morning. And pen needles 1/day  45 mL  12  . isosorbide mononitrate (IMDUR) 60 MG 24 hr tablet Take 1 tablet (60 mg total) by mouth daily.  90 tablet  3  . levothyroxine (SYNTHROID, LEVOTHROID) 125 MCG tablet TAKE 1 TABLET BY MOUTH ONCE EVERY DAY  30 tablet  2  . metFORMIN (GLUMETZA) 500 MG (MOD) 24 hr tablet Take 500 mg by mouth daily with breakfast.       . omeprazole (PRILOSEC) 20 MG capsule Take 1 capsule (20 mg total) by mouth daily.  30 capsule  11  . ONE TOUCH ULTRA TEST test strip USE AS DIRECTED 3 TIMES A DAY  100 each  3  . potassium chloride SA (K-DUR,KLOR-CON) 20 MEQ tablet Take 1 tablet (20 mEq total) by mouth daily.  30 tablet  6  . torsemide (DEMADEX) 20 MG tablet Take 2 tablets (40 mg total) by mouth 2 (two) times daily.  120 tablet  6   No current facility-administered medications on file prior to visit.   Review of Systems  Constitutional: Negative for unexpected weight change, or unusual diaphoresis  HENT: Negative for tinnitus.   Eyes: Negative for photophobia and visual disturbance.  Respiratory: Negative for choking and stridor.   Gastrointestinal: Negative for vomiting and blood in stool.  Genitourinary: Negative for hematuria and decreased urine volume.  Musculoskeletal: Negative for acute joint swelling Skin: Negative for color change and wound.  Neurological: Negative for tremors and numbness other than noted  Psychiatric/Behavioral: Negative for decreased concentration or  hyperactivity.       Objective:   Physical Exam BP 160/60  Pulse 66  Temp(Src) 97.6 F (36.4 C) (Oral)  Wt 235 lb 8 oz (106.822 kg)  BMI 33.79 kg/m2  SpO2 95% VS noted, not ill appearing Constitutional: Pt appears well-developed and well-nourished.  HENT: Head: NCAT.  Right Ear: External ear normal.  Left Ear: External ear normal.  Eyes: Conjunctivae and EOM are normal. Pupils are equal, round, and reactive to light.  Neck: Normal range of motion. Neck supple.  Cardiovascular: Normal rate and regular rhythm.   Pulmonary/Chest: Effort normal and breath sounds normal.  Abd:  Soft, NT, non-distended, + BS Neurological: Pt is alert. Not confused  Skin: Skin is warm. No erythema. Has trace to 1+ bilat pedal edema Psychiatric: Pt behavior is normal. Thought content normal.     Assessment & Plan:

## 2012-08-12 NOTE — Patient Instructions (Signed)
Please continue all other medications as before, and refills have been done if requested. Please have the pharmacy call with any other refills you may need. Please keep your appointments with your specialists as you have planned Please continue your efforts at being more active, low cholesterol diet, and weight control Please go to the LAB in the Basement (turn left off the elevator) for the tests to be done today You will be contacted by phone if any changes need to be made immediately.  Otherwise, you will receive a letter about your results with an explanation, but please check with MyChart first.  Please remember to sign up for My Chart if you have not done so, as this will be important to you in the future with finding out test results, communicating by private email, and scheduling acute appointments online when needed.  Please return in 6 months, or sooner if needed, with Lab testing done 3-5 days before

## 2012-08-13 ENCOUNTER — Other Ambulatory Visit (HOSPITAL_COMMUNITY): Payer: Self-pay | Admitting: *Deleted

## 2012-08-13 MED ORDER — AMIODARONE HCL 200 MG PO TABS: 100.0000 mg | ORAL_TABLET | Freq: Every morning | ORAL | Status: DC

## 2012-08-13 NOTE — Assessment & Plan Note (Signed)
stable overall by history and exam, recent data reviewed with pt, and pt to continue medical treatment as before,  to f/u any worsening symptoms or concerns Lab Results  Component Value Date   HGBA1C 7.4* 08/12/2012

## 2012-08-13 NOTE — Assessment & Plan Note (Signed)
stable overall by history and exam, recent data reviewed with pt, and pt to continue medical treatment as before,  to f/u any worsening symptoms or concerns Lab Results  Component Value Date   LDLCALC 56 08/12/2012

## 2012-08-13 NOTE — Assessment & Plan Note (Signed)
stable overall by history and exam, recent data reviewed with pt, and pt to continue medical treatment as before,  to f/u any worsening symptoms or concerns BP Readings from Last 3 Encounters:  08/12/12 160/60  07/09/12 142/62  06/25/12 152/68

## 2012-08-18 ENCOUNTER — Other Ambulatory Visit: Payer: Self-pay | Admitting: Internal Medicine

## 2012-08-18 ENCOUNTER — Encounter (HOSPITAL_COMMUNITY): Payer: Medicare PPO

## 2012-08-18 ENCOUNTER — Other Ambulatory Visit (HOSPITAL_COMMUNITY): Payer: Self-pay | Admitting: Adult Health

## 2012-08-18 ENCOUNTER — Other Ambulatory Visit (HOSPITAL_COMMUNITY): Payer: Self-pay | Admitting: *Deleted

## 2012-08-18 DIAGNOSIS — R0989 Other specified symptoms and signs involving the circulatory and respiratory systems: Secondary | ICD-10-CM

## 2012-08-18 MED ORDER — ATORVASTATIN CALCIUM 40 MG PO TABS: 40.0000 mg | ORAL_TABLET | Freq: Every day | ORAL | Status: DC

## 2012-08-26 ENCOUNTER — Ambulatory Visit (HOSPITAL_COMMUNITY): Payer: Medicare PPO

## 2012-09-02 ENCOUNTER — Ambulatory Visit (HOSPITAL_COMMUNITY)
Admission: RE | Admit: 2012-09-02 | Discharge: 2012-09-02 | Disposition: A | Discharge status: Home / Self Care | Payer: Medicare PPO | Source: Ambulatory Visit | Attending: Cardiology | Admitting: Cardiology

## 2012-09-02 ENCOUNTER — Encounter (HOSPITAL_COMMUNITY): Payer: Self-pay

## 2012-09-02 VITALS — BP 130/70 | HR 78 | Wt 236.4 lb

## 2012-09-02 DIAGNOSIS — S81009A Unspecified open wound, unspecified knee, initial encounter: Secondary | ICD-10-CM | POA: Insufficient documentation

## 2012-09-02 DIAGNOSIS — S91009A Unspecified open wound, unspecified ankle, initial encounter: Secondary | ICD-10-CM | POA: Insufficient documentation

## 2012-09-02 DIAGNOSIS — N183 Chronic kidney disease, stage 3 unspecified: Secondary | ICD-10-CM | POA: Insufficient documentation

## 2012-09-02 DIAGNOSIS — S81812A Laceration without foreign body, left lower leg, initial encounter: Secondary | ICD-10-CM | POA: Insufficient documentation

## 2012-09-02 DIAGNOSIS — X58XXXA Exposure to other specified factors, initial encounter: Secondary | ICD-10-CM | POA: Insufficient documentation

## 2012-09-02 DIAGNOSIS — I5032 Chronic diastolic (congestive) heart failure: Secondary | ICD-10-CM | POA: Insufficient documentation

## 2012-09-02 NOTE — Assessment & Plan Note (Signed)
Patient has increased LE edema, however LLE is discolored and has multiple blisters. He also has a laceration on his shin. The skin from the laceration is pink with no drainage and no odor. Some of the blisters are open and draining serous drainage with no odor. Pulses were dopplered. The patient was wearing his TED hose for 3 weeks for 24 hours straight. Instructed patient to only wear TED hose during the day and to remove at night. Has not had any fevers or chills. Patient denies pain.

## 2012-09-02 NOTE — Progress Notes (Signed)
Patient ID: [name], male   DOB: Feb 22, 1932, 79 y.o.   MRN: [id number]  HPI:  [name] is a very pleasant 79 year old male with  a history of diastolic heart failure as well as 2nd degree heart block (Wenckebach), hypertension,  hyperlipidemia, diabetes, previous CVA, and paroxysmal atrial fibrillation on amiodarone. Not on coumadin as felt to be high risk of bleeding due to h/o ETOH. Echo 3/11 EF 60-65% mild MR.   Discharged from [facility] 01/15/12 after being diuresed. Diuresed 7 pounds. Discharge weight 227 pounds.  Follow up:   Weight stable at home. Ambulating with rollator walker. Taking medications prescribed. [facility] the senior center [facility] the day. Not following low salt diet. Drinking less than 2 liters a day. He denies CP. +orthopnea sleeps in recliner. Children help him with filling with pill boxes. [facility]. Wore TED hose for 3 weeks straight and now blisters on LLE with drainage.   ROS: All systems negative except as listed in HPI, PMH and Problem List.  Past Medical History  Diagnosis Date  . Chronic diastolic heart failure     secondary to diastolic dysfunction EF (previous EF 35-45%)ef 60%4/09  . Arrhythmia     atrial fibrillation /pt on amiodarone,thought to be poor coumadin  . Stroke   . Other and unspecified hyperlipidemia   . Hypertension   . Hypertrophy of prostate without urinary obstruction and other lower urinary tract symptoms (LUTS)   . Osteoarthrosis, unspecified whether generalized or localized, unspecified site   . Type II or unspecified type diabetes mellitus without mention of complication, not stated as uncontrolled   . AV block, Mobitz 1     Intolerance to ACE's / discontiuation of beta blockers  . Obstructive sleep apnea   . Iron deficiency anemia, unspecified     s/p EGD and coloscopy 3/10.gastritis.hemorrhids  . Cerebrovascular accident   . Renal insufficiency   . Obesity   . Allergic rhinitis, cause unspecified 05/29/2010  .  Prostate cancer   . CAD (coronary artery disease)     cath 12/04: mLAD 50-60%, mCFX 20-30%, pRCA 50-60%, mRCA 40%    Current Outpatient Prescriptions  Medication Sig Dispense Refill  . allopurinol (ZYLOPRIM) 100 MG tablet Take 1 tablet (100 mg total) by mouth at bedtime.  30 tablet  30  . amiodarone (PACERONE) 200 MG tablet Take 0.5 tablets (100 mg total) by mouth every morning.  15 tablet  6  . amLODipine (NORVASC) 10 MG tablet Take 1 tablet (10 mg total) by mouth daily.  30 tablet  6  . aspirin 325 MG tablet Take 1 tablet (325 mg total) by mouth daily.  30 tablet  6  . atorvastatin (LIPITOR) 40 MG tablet Take 1 tablet (40 mg total) by mouth daily.  30 tablet  6  . B-D ULTRAFINE III SHORT PEN 31G X 8 MM MISC USE AS DIRECTED  100 each  3  . citalopram (CELEXA) 10 MG tablet Take 1 tablet (10 mg total) by mouth daily.  30 tablet  11  . cloNIDine (CATAPRES - DOSED IN MG/24 HR) 0.1 mg/24hr patch Place 1 patch (0.1 mg total) onto the skin once a week. Change on sundays  4 patch  6  . Fe Fum-Vit C-Vit B12-FA (TRIGELS-F) 460-60-0.01-1 MG CAPS Take 1 capsule by mouth daily.  30 capsule  6  . Fluticasone-Salmeterol (ADVAIR DISKUS) 100-50 MCG/DOSE AEPB Inhale 1 puff into the lungs 2 (two) times daily.  1 each  0  .  hydrALAZINE (APRESOLINE) 100 MG tablet TAKE 1 TABLET BY MOUTH 3 TIMES DAILY.  90 tablet  6  . insulin detemir (LEVEMIR FLEXPEN) 100 UNIT/ML injection Inject 1.25 mLs (125 Units total) into the skin every morning. And pen needles 1/day  45 mL  12  . isosorbide mononitrate (IMDUR) 60 MG 24 hr tablet Take 1 tablet (60 mg total) by mouth daily.  90 tablet  3  . levothyroxine (SYNTHROID, LEVOTHROID) 125 MCG tablet TAKE 1 TABLET BY MOUTH ONCE EVERY DAY  30 tablet  11  . metFORMIN (GLUMETZA) 500 MG (MOD) 24 hr tablet Take 500 mg by mouth daily with breakfast.      . omeprazole (PRILOSEC) 20 MG capsule Take 1 capsule (20 mg total) by mouth daily.  30 capsule  11  . ONE TOUCH ULTRA TEST test strip USE  AS DIRECTED 3 TIMES A DAY  100 each  3  . potassium chloride SA (K-DUR,KLOR-CON) 20 MEQ tablet Take 1 tablet (20 mEq total) by mouth daily.  30 tablet  6  . torsemide (DEMADEX) 20 MG tablet TAKE 2 TABLETS BY MOUTH TWICE A DAY  120 tablet  6   No current facility-administered medications for this encounter.     PHYSICAL EXAM: Filed Vitals:   09/02/12 1418  BP: 130/70  Pulse: 78  Weight: 236 lb 6.4 oz (107.23 kg)  SpO2: 96%   General:  Elderly male. no resp difficulty. Using roller walker; daughter's present HEENT: normal except for facial droop Neck: supple. JVP 9.  Carotids 2+ bilat; no bruits.  Cor: PMI nondisplaced. RRR.  Soft S4.  Lungs: CTA  Abdomen: obese .soft, nontender. + distention, Good bowel sounds. Extremities: no cyanosis, clubbing, rash. 2+ LLE edema. LL leg skin discolored with skin tear on shin and multiple blisters (some open and draining clear serous fluid with no odor) doppler pulses in LLE Neuro: alert & orientedx3, cranial nerves grossly intact except for facial droop. moves all 4 extremities w/o difficulty. affect pleasant     ASSESSMENT & PLAN:

## 2012-09-02 NOTE — Assessment & Plan Note (Signed)
Increased demadex to 60/40 for next two days and then will go back to 40/40. Will recheck BMET on Monday 09/08/12

## 2012-09-02 NOTE — Patient Instructions (Addendum)
Take 20 mg (1 tablet) extra for 2 days.   Need to only wear TED hose during the day, but take them off at night.  Follow up with Dr. Jonny Ruiz if lower leg wound doesn't get better.  Will get [facility] to check BMET Monday.   Follow up in 4-5 weeks

## 2012-09-02 NOTE — Assessment & Plan Note (Signed)
Volume status mildly elevated. Patient is not very compliant with dietary restrictions. Will increase torsemide to 60/40 for two days and then return to 40 BID. Will recheck BMET on Monday. Have reinforced with family and patient the need to restrict sodium intake to 2gm or less. Will follow up in 4-5 weeks.

## 2012-09-12 ENCOUNTER — Encounter: Payer: Self-pay | Admitting: Internal Medicine

## 2012-09-12 ENCOUNTER — Ambulatory Visit (INDEPENDENT_AMBULATORY_CARE_PROVIDER_SITE_OTHER): Payer: Medicare PPO | Admitting: Internal Medicine

## 2012-09-12 VITALS — BP 160/78 | HR 72 | Temp 98.9°F | Resp 16 | Wt 232.0 lb

## 2012-09-12 DIAGNOSIS — I10 Essential (primary) hypertension: Secondary | ICD-10-CM

## 2012-09-12 DIAGNOSIS — L97309 Non-pressure chronic ulcer of unspecified ankle with unspecified severity: Secondary | ICD-10-CM

## 2012-09-12 DIAGNOSIS — I83023 Varicose veins of left lower extremity with ulcer of ankle: Secondary | ICD-10-CM | POA: Insufficient documentation

## 2012-09-12 MED ORDER — MUPIROCIN 2 % EX OINT: TOPICAL_OINTMENT | CUTANEOUS | Status: DC

## 2012-09-12 NOTE — Assessment & Plan Note (Signed)
Continue with current prescription therapy as reflected on the Med list.  

## 2012-09-12 NOTE — Progress Notes (Signed)
  Subjective:    Patient ID: [name], male    DOB: March 20, 1932, 79 y.o.   MRN: [id number]  HPI  C/o ulcer on L leg x 2 wks, getting worse C/o LE edema  Review of Systems  Constitutional: Negative for fever, chills and appetite change.  HENT: Negative for neck stiffness.   Eyes: Negative for redness.  Cardiovascular: Positive for leg swelling.  Genitourinary: Negative for dysuria and urgency.  Skin: Positive for wound. Negative for rash.  Neurological: Negative for headaches.   Wt Readings from Last 3 Encounters:  09/12/12 232 lb (105.235 kg)  09/02/12 236 lb 6.4 oz (107.23 kg)  08/12/12 235 lb 8 oz (106.822 kg)        Objective:   Physical Exam  Constitutional: He is oriented to person, place, and time. He appears well-developed. No distress.  NAD  HENT:  Mouth/Throat: Oropharynx is clear and moist.  Eyes: Conjunctivae are normal. Pupils are equal, round, and reactive to light.  Neck: Normal range of motion. No JVD present. No thyromegaly present.  Cardiovascular: Normal rate, regular rhythm and intact distal pulses.  Exam reveals no gallop and no friction rub.   Murmur heard. Pulmonary/Chest: Effort normal and breath sounds normal. No respiratory distress. He has no wheezes. He has no rales. He exhibits no tenderness.  Abdominal: Soft. Bowel sounds are normal. He exhibits no distension and no mass. There is no tenderness. There is no rebound and no guarding.  Musculoskeletal: Normal range of motion. He exhibits edema and tenderness.  Lymphadenopathy:    He has no cervical adenopathy.  Neurological: He is alert and oriented to person, place, and time. He has normal reflexes. No cranial nerve deficit. He exhibits normal muscle tone. He displays a negative Romberg sign. Coordination and gait normal.  No meningeal signs  Skin: Skin is warm and dry. No rash noted.  Psychiatric: He has a normal mood and affect. His behavior is normal. Judgment and thought content normal.    Lab Results  Component Value Date   WBC 8.8 01/07/2012   HGB 9.7* 01/07/2012   HCT 30.7* 01/07/2012   PLT 303 01/07/2012   GLUCOSE 113* 08/12/2012   CHOL 118 08/12/2012   TRIG 134.0 08/12/2012   HDL 35.10* 08/12/2012   LDLDIRECT 83.9 09/19/2009   LDLCALC 56 08/12/2012   ALT 12 08/12/2012   AST 14 08/12/2012   NA 140 08/12/2012   K 4.4 08/12/2012   CL 104 08/12/2012   CREATININE 2.5* 08/12/2012   BUN 50* 08/12/2012   CO2 25 08/12/2012   TSH 2.33 09/21/2010   PSA 0.68 05/29/2010   INR 1.12 10/18/2010   HGBA1C 7.4* 08/12/2012   MICROALBUR 56.6* 11/27/2010          Assessment & Plan:

## 2012-09-12 NOTE — Assessment & Plan Note (Signed)
[name] was cleaned and dressed Bactroban [facility]

## 2012-09-14 ENCOUNTER — Other Ambulatory Visit: Payer: Self-pay | Admitting: Internal Medicine

## 2012-09-14 ENCOUNTER — Encounter: Payer: Self-pay | Admitting: Internal Medicine

## 2012-09-18 ENCOUNTER — Telehealth: Payer: Self-pay | Admitting: *Deleted

## 2012-09-18 NOTE — Telephone Encounter (Signed)
Spoke with Herbert Seta advised her of MDs note.

## 2012-09-18 NOTE — Telephone Encounter (Signed)
Use your protocol please Thx

## 2012-09-18 NOTE — Telephone Encounter (Signed)
Heather called requesting orders for wound care.  Please advise

## 2012-09-25 ENCOUNTER — Telehealth: Payer: Self-pay

## 2012-09-25 NOTE — Telephone Encounter (Signed)
Heather with [facility] called [email or web address] requesting a change orders to see pt for wound care twice weekly. Please advise ok to give verbal order, as I am unaware of a protocol. Thanks

## 2012-09-25 NOTE — Telephone Encounter (Signed)
Ok for verbal 

## 2012-09-25 NOTE — Telephone Encounter (Signed)
HHRN informed 

## 2012-09-30 ENCOUNTER — Encounter (HOSPITAL_COMMUNITY): Payer: Medicare PPO

## 2012-10-02 ENCOUNTER — Encounter: Payer: Self-pay | Admitting: Internal Medicine

## 2012-10-02 ENCOUNTER — Ambulatory Visit (INDEPENDENT_AMBULATORY_CARE_PROVIDER_SITE_OTHER): Payer: Medicare PPO | Admitting: Internal Medicine

## 2012-10-02 VITALS — BP 128/68 | HR 75 | Temp 97.8°F | Ht 73.0 in | Wt 236.2 lb

## 2012-10-02 DIAGNOSIS — L97309 Non-pressure chronic ulcer of unspecified ankle with unspecified severity: Secondary | ICD-10-CM

## 2012-10-02 DIAGNOSIS — I83023 Varicose veins of left lower extremity with ulcer of ankle: Secondary | ICD-10-CM

## 2012-10-02 DIAGNOSIS — I10 Essential (primary) hypertension: Secondary | ICD-10-CM

## 2012-10-02 DIAGNOSIS — G4734 Idiopathic sleep related nonobstructive alveolar hypoventilation: Secondary | ICD-10-CM

## 2012-10-02 DIAGNOSIS — R0902 Hypoxemia: Secondary | ICD-10-CM

## 2012-10-02 NOTE — Patient Instructions (Addendum)
The prescription for the supplies (pulse oximetry, scales and blood pressure monitor) is to be faxed to [facility] Please continue all other medications as before, and refills have been done if requested. Please keep your appointments with your specialists as you have planned - wound care for the left leg The ambulatory O2 saturation today was: 93% Since this was OK, we should order the ONO (overnight oximetry) one night soon when you are NOT using the oxygen.  If this ok, then the oxygen can be stopped  Please remember to sign up for My Chart if you have not done so, as this will be important to you in the future with finding out test results, communicating by private email, and scheduling acute appointments online when needed.

## 2012-10-02 NOTE — Assessment & Plan Note (Signed)
For f/u ONO per [facility], suspect may be able to d/c home o2

## 2012-10-02 NOTE — Assessment & Plan Note (Signed)
Improving, cont f/u wound clinic, no s/s cellultii toda,  to f/u any worsening symptoms or concerns

## 2012-10-02 NOTE — Assessment & Plan Note (Signed)
stable overall by history and exam, recent data reviewed with pt, and pt to continue medical treatment as before,  to f/u any worsening symptoms or concerns BP Readings from Last 3 Encounters:  10/02/12 128/68  09/12/12 160/78  09/02/12 130/70

## 2012-10-02 NOTE — Progress Notes (Signed)
Subjective:    Patient ID: [name], male    DOB: Apr 27, 1932, 79 y.o.   MRN: [id number]  HPI  Here to f/u with daughter who is employee of family business [employer], needs eval for Home o2 purpose; Pt denies chest pain, increased sob or doe, wheezing, orthopnea, PND, increased LE swelling, palpitations, dizziness or syncope.  Pt denies new neurological symptoms such as new headache, or facial or extremity weakness or numbness   Pt denies polydipsia, polyuria.  O2 sat at rest 95%, then 93% with ambulation with rolling walker at 183ft.  Uses home o2 at night.  Pt denies fever, wt loss, night sweats, loss of appetite, or other constitutional symptoms  .  Leg wound superficial now and healing Past Medical History  Diagnosis Date  . Chronic diastolic heart failure     secondary to diastolic dysfunction EF (previous EF 35-45%)ef 60%4/09  . Arrhythmia     atrial fibrillation /pt on amiodarone,thought to be poor coumadin  . Stroke   . Other and unspecified hyperlipidemia   . Hypertension   . Hypertrophy of prostate without urinary obstruction and other lower urinary tract symptoms (LUTS)   . Osteoarthrosis, unspecified whether generalized or localized, unspecified site   . Type II or unspecified type diabetes mellitus without mention of complication, not stated as uncontrolled   . AV block, Mobitz 1     Intolerance to ACE's / discontiuation of beta blockers  . Obstructive sleep apnea   . Iron deficiency anemia, unspecified     s/p EGD and coloscopy 3/10.gastritis.hemorrhids  . Cerebrovascular accident   . Renal insufficiency   . Obesity   . Allergic rhinitis, cause unspecified 05/29/2010  . Prostate cancer   . CAD (coronary artery disease)     cath 12/04: mLAD 50-60%, mCFX 20-30%, pRCA 50-60%, mRCA 40%   Past Surgical History  Procedure Laterality Date  . Prostate surgery    . Prostate surgury      hx of    reports that he has quit smoking. He does not have any smokeless  tobacco history on file. He reports that he does not drink alcohol. His drug history is not on file. family history includes Heart disease in his father. Allergies  Allergen Reactions  . Ace Inhibitors Other (See Comments)    me  hyposion  . Beta Adrenergic Blockers Other (See Comments)     use cautiously secondary to 2nd degree heart block, hypotension   Current Outpatient Prescriptions on File Prior to Visit  Medication Sig Dispense Refill  . allopurinol (ZYLOPRIM) 100 MG tablet Take 1 tablet (100 mg total) by mouth at bedtime.  30 tablet  30  . amiodarone (PACERONE) 200 MG tablet Take 0.5 tablets (100 mg total) by mouth every morning.  15 tablet  6  . amLODipine (NORVASC) 10 MG tablet Take 1 tablet (10 mg total) by mouth daily.  30 tablet  6  . aspirin 325 MG tablet Take 1 tablet (325 mg total) by mouth daily.  30 tablet  6  . atorvastatin (LIPITOR) 40 MG tablet Take 1 tablet (40 mg total) by mouth daily.  30 tablet  6  . B-D ULTRAFINE III SHORT PEN 31G X 8 MM MISC USE AS DIRECTED  100 each  3  . cloNIDine (CATAPRES - DOSED IN MG/24 HR) 0.1 mg/24hr patch Place 1 patch (0.1 mg total) onto the skin once a week. Change on sundays  4 patch  6  . Fe Fum-Vit  C-Vit B12-FA (TRIGELS-F) 460-60-0.01-1 MG CAPS Take 1 capsule by mouth daily.  30 capsule  6  . Fluticasone-Salmeterol (ADVAIR DISKUS) 100-50 MCG/DOSE AEPB Inhale 1 puff into the lungs 2 (two) times daily.  1 each  0  . hydrALAZINE (APRESOLINE) 100 MG tablet TAKE 1 TABLET BY MOUTH 3 TIMES DAILY.  90 tablet  6  . insulin detemir (LEVEMIR FLEXPEN) 100 UNIT/ML injection Inject 1.25 mLs (125 Units total) into the skin every morning. And pen needles 1/day  45 mL  12  . isosorbide mononitrate (IMDUR) 60 MG 24 hr tablet Take 1 tablet (60 mg total) by mouth daily.  90 tablet  3  . levothyroxine (SYNTHROID, LEVOTHROID) 125 MCG tablet TAKE 1 TABLET BY MOUTH ONCE EVERY DAY  30 tablet  11  . metFORMIN (GLUMETZA) 500 MG (MOD) 24 hr tablet Take 500 mg by  mouth daily with breakfast.      . mupirocin ointment (BACTROBAN) 2 % Use qd with dressing change  30 g  0  . omeprazole (PRILOSEC) 20 MG capsule Take 1 capsule (20 mg total) by mouth daily.  30 capsule  11  . ONE TOUCH ULTRA TEST test strip USE AS DIRECTED 3 TIMES A DAY  100 each  3  . potassium chloride SA (K-DUR,KLOR-CON) 20 MEQ tablet Take 1 tablet (20 mEq total) by mouth daily.  30 tablet  6  . torsemide (DEMADEX) 20 MG tablet TAKE 2 TABLETS BY MOUTH TWICE A DAY  120 tablet  6   No current facility-administered medications on file prior to visit.   Review of Systems  Constitutional: Negative for unexpected weight change, or unusual diaphoresis  HENT: Negative for tinnitus.   Eyes: Negative for photophobia and visual disturbance.  Respiratory: Negative for choking and stridor.   Gastrointestinal: Negative for vomiting and blood in stool.  Genitourinary: Negative for hematuria and decreased urine volume.  Musculoskeletal: Negative for acute joint swelling Skin: Negative for color change and wound.  Neurological: Negative for tremors and numbness other than noted  Psychiatric/Behavioral: Negative for decreased concentration or  hyperactivity.       Objective:   Physical Exam BP 128/68  Pulse 75  Temp(Src) 97.8 F (36.6 C) (Oral)  Ht 6\' 1"  (1.854 m)  Wt 236 lb 4 oz (107.162 kg)  BMI 31.18 kg/m2  SpO2 95% VS noted,  Constitutional: Pt appears well-developed and well-nourished.  HENT: Head: NCAT.  Right Ear: External ear normal.  Left Ear: External ear normal.  Eyes: Conjunctivae and EOM are normal. Pupils are equal, round, and reactive to light.  Neck: Normal range of motion. Neck supple.  Cardiovascular: Normal rate and regular rhythm.   Pulmonary/Chest: Effort normal and breath sounds normal.  Abd:  Soft, NT, non-distended, + BS Neurological: Pt is alert. Not confused  Skin: Skin is warm. No erythema.  Psychiatric: Pt behavior is normal. Thought content normal.      Assessment & Plan:

## 2012-10-03 ENCOUNTER — Ambulatory Visit: Payer: Medicare PPO | Admitting: Internal Medicine

## 2012-10-15 ENCOUNTER — Telehealth (HOSPITAL_COMMUNITY): Payer: Self-pay | Admitting: Cardiology

## 2012-10-15 NOTE — Telephone Encounter (Signed)
recevied a call from [place] with [facility], she states pt was apparently stable yesterday when nurse saw him but today telemonitoring shows he has gained 5 lb since yesterday, per Dr Gala Romney can give extra dose of torsemide, called pt's daughter [name] and left mess to give pt extra torsemide tonight and call back to let us know she received message

## 2012-10-15 NOTE — Telephone Encounter (Signed)
[name] has been following pt. And feels pt is stable enough to d/c. pts weight is stable, daughter has bought scale for home and b/p cuff. Does very well with med management. If it's ok to d/c pt please call with verbal

## 2012-11-16 ENCOUNTER — Other Ambulatory Visit: Payer: Self-pay | Admitting: Internal Medicine

## 2012-11-17 ENCOUNTER — Other Ambulatory Visit: Payer: Self-pay | Admitting: Internal Medicine

## 2012-11-17 ENCOUNTER — Other Ambulatory Visit (HOSPITAL_COMMUNITY): Payer: Self-pay | Admitting: *Deleted

## 2012-11-17 MED ORDER — CLONIDINE HCL 0.1 MG/24HR TD PTWK: 1.0000 | MEDICATED_PATCH | TRANSDERMAL | Status: DC

## 2012-11-17 NOTE — Telephone Encounter (Signed)
Refill done.  

## 2012-11-25 ENCOUNTER — Ambulatory Visit (INDEPENDENT_AMBULATORY_CARE_PROVIDER_SITE_OTHER): Payer: Medicare PPO

## 2012-11-25 DIAGNOSIS — Z23 Encounter for immunization: Secondary | ICD-10-CM

## 2012-11-27 ENCOUNTER — Other Ambulatory Visit: Payer: Self-pay

## 2012-11-27 ENCOUNTER — Other Ambulatory Visit (HOSPITAL_COMMUNITY): Payer: Self-pay | Admitting: Internal Medicine

## 2013-02-10 ENCOUNTER — Other Ambulatory Visit (HOSPITAL_COMMUNITY): Payer: Self-pay | Admitting: *Deleted

## 2013-02-10 MED ORDER — FE FUM-VIT C-VIT B12-FA 460-60-0.01-1 MG PO CAPS: 1.0000 | ORAL_CAPSULE | Freq: Every day | ORAL | Status: DC

## 2013-02-10 MED ORDER — AMLODIPINE BESYLATE 10 MG PO TABS: 10.0000 mg | ORAL_TABLET | Freq: Every day | ORAL | Status: DC

## 2013-02-16 ENCOUNTER — Other Ambulatory Visit (HOSPITAL_COMMUNITY): Payer: Self-pay | Admitting: *Deleted

## 2013-02-16 ENCOUNTER — Other Ambulatory Visit (HOSPITAL_COMMUNITY): Payer: Self-pay

## 2013-02-16 MED ORDER — POTASSIUM CHLORIDE CRYS ER 20 MEQ PO TBCR: 20.0000 meq | EXTENDED_RELEASE_TABLET | Freq: Every day | ORAL | Status: DC

## 2013-02-16 MED ORDER — FE FUM-VIT C-VIT B12-FA 460-60-0.01-1 MG PO CAPS: 1.0000 | ORAL_CAPSULE | Freq: Every day | ORAL | Status: DC

## 2013-02-16 MED ORDER — AMLODIPINE BESYLATE 10 MG PO TABS: 10.0000 mg | ORAL_TABLET | Freq: Every day | ORAL | Status: DC

## 2013-02-17 ENCOUNTER — Other Ambulatory Visit (HOSPITAL_COMMUNITY): Payer: Self-pay

## 2013-03-05 ENCOUNTER — Other Ambulatory Visit: Payer: Self-pay | Admitting: Internal Medicine

## 2013-03-05 MED ORDER — GLUCOSE BLOOD VI STRP: ORAL_STRIP | Status: DC

## 2013-03-05 NOTE — Addendum Note (Signed)
Addended by: Sharon Seller B on: 03/05/2013 01:42 PM   Modules accepted: Orders

## 2013-03-05 NOTE — Telephone Encounter (Signed)
[name], dtr, phoned to make sure that pharmacy had requested refill for test strips.  Refilled per protocol. Patient notified/dtr while on phone.   Last OV with PCP 10/02/12

## 2013-03-14 ENCOUNTER — Other Ambulatory Visit (HOSPITAL_COMMUNITY): Payer: Self-pay | Admitting: Internal Medicine

## 2013-03-17 ENCOUNTER — Other Ambulatory Visit: Payer: Self-pay

## 2013-03-17 MED ORDER — AMIODARONE HCL 200 MG PO TABS: 100.0000 mg | ORAL_TABLET | Freq: Every morning | ORAL | Status: DC

## 2013-03-19 ENCOUNTER — Other Ambulatory Visit (HOSPITAL_COMMUNITY): Payer: Self-pay

## 2013-03-19 MED ORDER — AMIODARONE HCL 200 MG PO TABS: 100.0000 mg | ORAL_TABLET | Freq: Every morning | ORAL | Status: DC

## 2013-03-26 ENCOUNTER — Telehealth: Payer: Self-pay | Admitting: *Deleted

## 2013-03-26 NOTE — Telephone Encounter (Signed)
Spoke with Otila Kluver at [facility] started.

## 2013-03-27 NOTE — Telephone Encounter (Signed)
Received  PA  Approval for Citalopram HBR 10 mg tablet.  Authorization good through 03/27/2014.

## 2013-04-02 ENCOUNTER — Telehealth (HOSPITAL_COMMUNITY): Payer: Self-pay | Admitting: Cardiology

## 2013-04-02 NOTE — Telephone Encounter (Signed)
PTS DAUGHTER CALLED AGAIN WITH CONCERNS OF LE EDEMA/BLISTERS DAUGHTER STATES AFTER TAKING INCREASED TORSEMIDE X 4 DAYS (60/40) FLUID IS STILL NOT MOVING DAUGHTER STATES SHE IS UNABLE TO TELL IF SOB IS WORSE AS PT IS ALWAYS SOB, STATES SHE CAN TELL HIS WEIGHT IS UP AS HIS PANTS ARE A LOT TIGHTER THAN NORMAL PT GIVEN NEXT AVAILABLE 04/03/13 @11  ADVISED IF PT GETS WORSE OVERNIGHT, SHE MAY NEED TO TAKE HIM TO ER FOR FURTHER EVALUATION

## 2013-04-03 ENCOUNTER — Encounter (HOSPITAL_COMMUNITY): Payer: Self-pay

## 2013-04-03 ENCOUNTER — Ambulatory Visit (HOSPITAL_COMMUNITY)
Admission: RE | Admit: 2013-04-03 | Discharge: 2013-04-03 | Disposition: A | Discharge status: Home / Self Care | Payer: Medicare PPO | Source: Ambulatory Visit | Attending: Internal Medicine | Admitting: Internal Medicine

## 2013-04-03 ENCOUNTER — Other Ambulatory Visit (HOSPITAL_COMMUNITY): Payer: Self-pay | Admitting: Adult Health

## 2013-04-03 VITALS — BP 198/100 | HR 93 | Wt 239.4 lb

## 2013-04-03 DIAGNOSIS — L03119 Cellulitis of unspecified part of limb: Secondary | ICD-10-CM

## 2013-04-03 DIAGNOSIS — I5033 Acute on chronic diastolic (congestive) heart failure: Secondary | ICD-10-CM

## 2013-04-03 DIAGNOSIS — I10 Essential (primary) hypertension: Secondary | ICD-10-CM | POA: Insufficient documentation

## 2013-04-03 DIAGNOSIS — I5032 Chronic diastolic (congestive) heart failure: Secondary | ICD-10-CM

## 2013-04-03 DIAGNOSIS — S81802A Unspecified open wound, left lower leg, initial encounter: Secondary | ICD-10-CM

## 2013-04-03 DIAGNOSIS — I441 Atrioventricular block, second degree: Secondary | ICD-10-CM | POA: Insufficient documentation

## 2013-04-03 DIAGNOSIS — L03116 Cellulitis of left lower limb: Secondary | ICD-10-CM

## 2013-04-03 DIAGNOSIS — Z794 Long term (current) use of insulin: Secondary | ICD-10-CM | POA: Insufficient documentation

## 2013-04-03 DIAGNOSIS — E119 Type 2 diabetes mellitus without complications: Secondary | ICD-10-CM | POA: Insufficient documentation

## 2013-04-03 DIAGNOSIS — L02419 Cutaneous abscess of limb, unspecified: Secondary | ICD-10-CM | POA: Insufficient documentation

## 2013-04-03 DIAGNOSIS — I509 Heart failure, unspecified: Secondary | ICD-10-CM | POA: Insufficient documentation

## 2013-04-03 LAB — BASIC METABOLIC PANEL
BUN: 46 mg/dL — ABNORMAL HIGH (ref 6–23)
CHLORIDE: 105 meq/L (ref 96–112)
CO2: 26 meq/L (ref 19–32)
Calcium: 9.6 mg/dL (ref 8.4–10.5)
Creatinine, Ser: 2.29 mg/dL — ABNORMAL HIGH (ref 0.50–1.35)
GFR calc non Af Amer: 26 mL/min — ABNORMAL LOW (ref >90)
GFR, EST AFRICAN AMERICAN: 30 mL/min — AB (ref >90)
Glucose, Bld: 86 mg/dL (ref 70–99)
Potassium: 4.4 mEq/L (ref 3.7–5.3)
SODIUM: 145 meq/L (ref 137–147)

## 2013-04-03 LAB — PRO B NATRIURETIC PEPTIDE: PRO B NATRI PEPTIDE: 893.8 pg/mL — AB (ref 0–450)

## 2013-04-03 MED ORDER — METOLAZONE 2.5 MG PO TABS: 2.5000 mg | ORAL_TABLET | ORAL | Status: DC | PRN

## 2013-04-03 MED ORDER — TORSEMIDE 20 MG PO TABS: 60.0000 mg | ORAL_TABLET | Freq: Two times a day (BID) | ORAL | Status: DC

## 2013-04-03 MED ORDER — DOXYCYCLINE HYCLATE 100 MG PO CAPS: 100.0000 mg | ORAL_CAPSULE | Freq: Two times a day (BID) | ORAL | Status: DC

## 2013-04-03 NOTE — Patient Instructions (Signed)
Follow up Monday at 3:30   Take torsemide 60 mg twice a day  Take Metolazone 2.5 mg today and tomorrow  Do the following things EVERYDAY: 1) Weigh yourself in the morning before breakfast. Write it down and keep it in a log. 2) Take your medicines as prescribed 3) Eat low salt foods-Limit salt (sodium) to 2000 mg per day.  4) Stay as active as you can everyday 5) Limit all fluids for the day to less than 2 liters

## 2013-04-03 NOTE — Progress Notes (Signed)
Patient ID: CURBY CARSWELL, male   DOB: 02/26/1932, 80 y.o.   MRN: [id number]  PCP: Dr [name]   HPI: Mr. Mcnellis is a very pleasant 80 year old male with  a history of diastolic heart failure as well as 2nd degree heart block (Wenckebach), hypertension,  hyperlipidemia, diabetes, previous CVA, and paroxysmal atrial fibrillation on amiodarone. Not on coumadin as felt to be high risk of bleeding due to h/o ETOH. Echo 3/11 EF 60-65% mild MR.   Discharged from [facility] 01/15/12 after being diuresed. ECHo EF 55-60% Diuresed 7 pounds. Discharge weight 227 pounds.  Acute Visit:  He has not been weighing at home. Complains of increased lower extremity edema with blisters on LLE.  SOB with exertion. + Orthopnea. Ambulates with rolling walker. Taking all medications. He has not am torsemide yet. He usually takes his morning torsemide at 12:00.  His daughter prepares his medications. Devloped LLE wound. Drinking extra fluid. Tries to follow low salt diet.   .   ROS: All systems negative except as listed in HPI, PMH and Problem List.  Past Medical History  Diagnosis Date  . Chronic diastolic heart failure     secondary to diastolic dysfunction EF (previous EF 35-45%)ef 60%4/09  . Arrhythmia     atrial fibrillation /pt on amiodarone,thought to be poor coumadin  . Stroke   . Other and unspecified hyperlipidemia   . Hypertension   . Hypertrophy of prostate without urinary obstruction and other lower urinary tract symptoms (LUTS)   . Osteoarthrosis, unspecified whether generalized or localized, unspecified site   . Type II or unspecified type diabetes mellitus without mention of complication, not stated as uncontrolled   . AV block, Mobitz 1     Intolerance to ACE's / discontiuation of beta blockers  . Obstructive sleep apnea   . Iron deficiency anemia, unspecified     s/p EGD and coloscopy 3/10.gastritis.hemorrhids  . Cerebrovascular accident   . Renal insufficiency   . Obesity   . Allergic  rhinitis, cause unspecified 05/29/2010  . Prostate cancer   . CAD (coronary artery disease)     cath 12/04: mLAD 50-60%, mCFX 20-30%, pRCA 50-60%, mRCA 40%    Current Outpatient Prescriptions  Medication Sig Dispense Refill  . allopurinol (ZYLOPRIM) 100 MG tablet Take 1 tablet (100 mg total) by mouth at bedtime.  30 tablet  30  . amiodarone (PACERONE) 200 MG tablet Take 0.5 tablets (100 mg total) by mouth every morning.  15 tablet  6  . amLODipine (NORVASC) 10 MG tablet Take 1 tablet (10 mg total) by mouth daily.  30 tablet  6  . aspirin 325 MG tablet Take 1 tablet (325 mg total) by mouth daily.  30 tablet  6  . atorvastatin (LIPITOR) 40 MG tablet TAKE 1 TABLET BY MOUTH EVERY DAY  30 tablet  6  . B-D ULTRAFINE III SHORT PEN 31G X 8 MM MISC USE AS DIRECTED  100 each  3  . B-D ULTRAFINE III SHORT PEN 31G X 8 MM MISC USE AS DIRECTED  100 each  0  . citalopram (CELEXA) 10 MG tablet Take 10 mg by mouth 2 (two) times daily.      . cloNIDine (CATAPRES - DOSED IN MG/24 HR) 0.1 mg/24hr patch Place 1 patch (0.1 mg total) onto the skin once a week. Change on sundays  4 patch  6  . Fe Fum-Vit C-Vit B12-FA (TRIGELS-F) 460-60-0.01-1 MG CAPS capsule Take 1 capsule by mouth daily.  30 capsule  6  . Fluticasone-Salmeterol (ADVAIR DISKUS) 100-50 MCG/DOSE AEPB Inhale 1 puff into the lungs 2 (two) times daily.  1 each  0  . glucose blood (ONE TOUCH ULTRA TEST) test strip Use as directed three times daily to check blood sugar.  Diagnosis code 250.02  100 each  5  . hydrALAZINE (APRESOLINE) 100 MG tablet TAKE 1 TABLET BY MOUTH 3 TIMES A DAY  90 tablet  6  . insulin detemir (LEVEMIR FLEXPEN) 100 UNIT/ML injection Inject 1.25 mLs (125 Units total) into the skin every morning. And pen needles 1/day  45 mL  12  . isosorbide mononitrate (IMDUR) 60 MG 24 hr tablet Take 1 tablet (60 mg total) by mouth daily.  90 tablet  3  . levothyroxine (SYNTHROID, LEVOTHROID) 125 MCG tablet TAKE 1 TABLET BY MOUTH ONCE EVERY DAY  30  tablet  11  . metFORMIN (GLUMETZA) 500 MG (MOD) 24 hr tablet Take 500 mg by mouth daily with breakfast.      . mupirocin ointment (BACTROBAN) 2 % Use qd with dressing change  30 g  0  . omeprazole (PRILOSEC) 20 MG capsule Take 1 capsule (20 mg total) by mouth daily.  30 capsule  11  . potassium chloride SA (K-DUR,KLOR-CON) 20 MEQ tablet Take 1 tablet (20 mEq total) by mouth daily.  30 tablet  6  . torsemide (DEMADEX) 20 MG tablet TAKE 2 TABLETS BY MOUTH TWICE A DAY  120 tablet  6   No current facility-administered medications for this encounter.     PHYSICAL EXAM: Filed Vitals:   04/03/13 1102  BP: 198/100  Pulse: 93  Weight: 239 lb 6.4 oz (108.591 kg)  SpO2: 98%   General:  Elderly male. no resp difficulty. Using roller walker; daughter's present HEENT: normal except for facial droop Neck: supple. JVP ~10.  Carotids 2+ bilat; no bruits.  Cor: PMI nondisplaced. RRR.  Soft S4.  Lungs: CTA  Abdomen: obese .soft, nontender. ++ distention, Good bowel sounds. Extremities: no cyanosis, clubbing, rash. R and LLE 2+ edema. LLE anterior aspect 3x2 cm 100% red, and multiple blisters (some open and draining clear serous fluid with no odor)  Neuro: alert & orientedx3, cranial nerves grossly intact except for facial droop. moves all 4 extremities w/o difficulty. affect pleasant    ASSESSMENT & PLAN:  1. Chronic Diastolic Heart Failure- ECHO EF 55-60%. NYHA IIIB with increased dyspnea on exertion. Volume status elevated. Increase torsemide to 60 mg twice a day and add Metolazone 2.5 mg for 2 days. Continue 20 meq potassium daily with an extra 20 meq of potassium for the next 2 days. If volume status does not improved will need to admit on Monday.  Refer to [facility] for heart failure and wound care.  Reinforced daily weights, limiting fluid intake to < 2 liters per day, and medication compliance.  Check BMET and pro BNP  2. LLE Cellulitis- Start doxycycline 100 mg twice a day for 7 days.   3. LLE  wound - [facility] to start topical wound care  4. HTN- BP elevated but he has not had his am medications therefore will not change meds today. I have asked his daughter to make follow up with PCP.    Follow up Monday to reassess volume status.  CLEGG,AMY  NP-C   12:48 PM

## 2013-04-05 ENCOUNTER — Encounter: Payer: Self-pay | Admitting: Internal Medicine

## 2013-04-06 ENCOUNTER — Inpatient Hospital Stay (HOSPITAL_COMMUNITY): Admission: RE | Admit: 2013-04-06 | Payer: Medicare PPO | Source: Ambulatory Visit

## 2013-04-08 ENCOUNTER — Inpatient Hospital Stay (HOSPITAL_COMMUNITY): Admission: RE | Admit: 2013-04-08 | Payer: Medicare PPO | Source: Ambulatory Visit

## 2013-04-09 ENCOUNTER — Telehealth (HOSPITAL_COMMUNITY): Payer: Self-pay | Admitting: Adult Health

## 2013-04-09 NOTE — Telephone Encounter (Signed)
Provided his daughter with lab results. Provided with lab results.   Creatinine trended 2.2> 2.9  Instructed to hold torsemide and potassium for 2 days. Then start torsemide 40 mg twice a day with 20 meq potassium daily.   She plans to call back for follow up appointment.   Zeva Leber NP-C  5:02 PM

## 2013-04-20 ENCOUNTER — Emergency Department (HOSPITAL_COMMUNITY): Payer: Medicare PPO

## 2013-04-20 ENCOUNTER — Inpatient Hospital Stay (HOSPITAL_COMMUNITY)
Admission: EM | Admit: 2013-04-20 | Discharge: 2013-04-28 | DRG: 802 | Disposition: A | Discharge status: Skilled Nursing Facility | Payer: Medicare PPO | Attending: Internal Medicine | Admitting: Internal Medicine

## 2013-04-20 ENCOUNTER — Encounter (HOSPITAL_COMMUNITY): Payer: Self-pay | Admitting: Emergency Medicine

## 2013-04-20 DIAGNOSIS — I129 Hypertensive chronic kidney disease with stage 1 through stage 4 chronic kidney disease, or unspecified chronic kidney disease: Secondary | ICD-10-CM

## 2013-04-20 DIAGNOSIS — E669 Obesity, unspecified: Secondary | ICD-10-CM | POA: Diagnosis present

## 2013-04-20 DIAGNOSIS — Z66 Do not resuscitate: Secondary | ICD-10-CM | POA: Diagnosis present

## 2013-04-20 DIAGNOSIS — R609 Edema, unspecified: Secondary | ICD-10-CM

## 2013-04-20 DIAGNOSIS — Z860100 Personal history of colon polyps, unspecified: Secondary | ICD-10-CM

## 2013-04-20 DIAGNOSIS — M5412 Radiculopathy, cervical region: Secondary | ICD-10-CM

## 2013-04-20 DIAGNOSIS — M109 Gout, unspecified: Secondary | ICD-10-CM

## 2013-04-20 DIAGNOSIS — R195 Other fecal abnormalities: Secondary | ICD-10-CM

## 2013-04-20 DIAGNOSIS — Z9079 Acquired absence of other genital organ(s): Secondary | ICD-10-CM

## 2013-04-20 DIAGNOSIS — G4734 Idiopathic sleep related nonobstructive alveolar hypoventilation: Secondary | ICD-10-CM

## 2013-04-20 DIAGNOSIS — R9389 Abnormal findings on diagnostic imaging of other specified body structures: Secondary | ICD-10-CM | POA: Diagnosis not present

## 2013-04-20 DIAGNOSIS — I441 Atrioventricular block, second degree: Secondary | ICD-10-CM

## 2013-04-20 DIAGNOSIS — Z0000 Encounter for general adult medical examination without abnormal findings: Secondary | ICD-10-CM

## 2013-04-20 DIAGNOSIS — I509 Heart failure, unspecified: Secondary | ICD-10-CM | POA: Diagnosis present

## 2013-04-20 DIAGNOSIS — G609 Hereditary and idiopathic neuropathy, unspecified: Secondary | ICD-10-CM

## 2013-04-20 DIAGNOSIS — C7951 Secondary malignant neoplasm of bone: Secondary | ICD-10-CM | POA: Diagnosis present

## 2013-04-20 DIAGNOSIS — R040 Epistaxis: Secondary | ICD-10-CM

## 2013-04-20 DIAGNOSIS — K279 Peptic ulcer, site unspecified, unspecified as acute or chronic, without hemorrhage or perforation: Secondary | ICD-10-CM

## 2013-04-20 DIAGNOSIS — J309 Allergic rhinitis, unspecified: Secondary | ICD-10-CM

## 2013-04-20 DIAGNOSIS — R339 Retention of urine, unspecified: Secondary | ICD-10-CM | POA: Diagnosis not present

## 2013-04-20 DIAGNOSIS — D649 Anemia, unspecified: Principal | ICD-10-CM | POA: Diagnosis present

## 2013-04-20 DIAGNOSIS — D599 Acquired hemolytic anemia, unspecified: Secondary | ICD-10-CM | POA: Diagnosis present

## 2013-04-20 DIAGNOSIS — M1990 Unspecified osteoarthritis, unspecified site: Secondary | ICD-10-CM

## 2013-04-20 DIAGNOSIS — Z794 Long term (current) use of insulin: Secondary | ICD-10-CM

## 2013-04-20 DIAGNOSIS — G4700 Insomnia, unspecified: Secondary | ICD-10-CM

## 2013-04-20 DIAGNOSIS — E1165 Type 2 diabetes mellitus with hyperglycemia: Secondary | ICD-10-CM

## 2013-04-20 DIAGNOSIS — D631 Anemia in chronic kidney disease: Secondary | ICD-10-CM | POA: Diagnosis present

## 2013-04-20 DIAGNOSIS — F411 Generalized anxiety disorder: Secondary | ICD-10-CM

## 2013-04-20 DIAGNOSIS — Z993 Dependence on wheelchair: Secondary | ICD-10-CM

## 2013-04-20 DIAGNOSIS — Z8673 Personal history of transient ischemic attack (TIA), and cerebral infarction without residual deficits: Secondary | ICD-10-CM

## 2013-04-20 DIAGNOSIS — S81802A Unspecified open wound, left lower leg, initial encounter: Secondary | ICD-10-CM

## 2013-04-20 DIAGNOSIS — N183 Chronic kidney disease, stage 3 unspecified: Secondary | ICD-10-CM

## 2013-04-20 DIAGNOSIS — Z6834 Body mass index (BMI) 34.0-34.9, adult: Secondary | ICD-10-CM

## 2013-04-20 DIAGNOSIS — R937 Abnormal findings on diagnostic imaging of other parts of musculoskeletal system: Secondary | ICD-10-CM

## 2013-04-20 DIAGNOSIS — S3720XA Unspecified injury of bladder, initial encounter: Secondary | ICD-10-CM | POA: Diagnosis not present

## 2013-04-20 DIAGNOSIS — Z888 Allergy status to other drugs, medicaments and biological substances status: Secondary | ICD-10-CM

## 2013-04-20 DIAGNOSIS — G4733 Obstructive sleep apnea (adult) (pediatric): Secondary | ICD-10-CM

## 2013-04-20 DIAGNOSIS — D509 Iron deficiency anemia, unspecified: Secondary | ICD-10-CM

## 2013-04-20 DIAGNOSIS — Z87891 Personal history of nicotine dependence: Secondary | ICD-10-CM

## 2013-04-20 DIAGNOSIS — R9431 Abnormal electrocardiogram [ECG] [EKG]: Secondary | ICD-10-CM

## 2013-04-20 DIAGNOSIS — I4430 Unspecified atrioventricular block: Secondary | ICD-10-CM | POA: Diagnosis present

## 2013-04-20 DIAGNOSIS — Z79899 Other long term (current) drug therapy: Secondary | ICD-10-CM

## 2013-04-20 DIAGNOSIS — I83023 Varicose veins of left lower extremity with ulcer of ankle: Secondary | ICD-10-CM

## 2013-04-20 DIAGNOSIS — R059 Cough, unspecified: Secondary | ICD-10-CM

## 2013-04-20 DIAGNOSIS — I5033 Acute on chronic diastolic (congestive) heart failure: Secondary | ICD-10-CM

## 2013-04-20 DIAGNOSIS — N39490 Overflow incontinence: Secondary | ICD-10-CM | POA: Diagnosis not present

## 2013-04-20 DIAGNOSIS — N189 Chronic kidney disease, unspecified: Secondary | ICD-10-CM | POA: Diagnosis present

## 2013-04-20 DIAGNOSIS — I2510 Atherosclerotic heart disease of native coronary artery without angina pectoris: Secondary | ICD-10-CM

## 2013-04-20 DIAGNOSIS — I10 Essential (primary) hypertension: Secondary | ICD-10-CM

## 2013-04-20 DIAGNOSIS — E0590 Thyrotoxicosis, unspecified without thyrotoxic crisis or storm: Secondary | ICD-10-CM

## 2013-04-20 DIAGNOSIS — D131 Benign neoplasm of stomach: Secondary | ICD-10-CM | POA: Diagnosis present

## 2013-04-20 DIAGNOSIS — R590 Localized enlarged lymph nodes: Secondary | ICD-10-CM

## 2013-04-20 DIAGNOSIS — E785 Hyperlipidemia, unspecified: Secondary | ICD-10-CM

## 2013-04-20 DIAGNOSIS — Z8679 Personal history of other diseases of the circulatory system: Secondary | ICD-10-CM

## 2013-04-20 DIAGNOSIS — Z7982 Long term (current) use of aspirin: Secondary | ICD-10-CM

## 2013-04-20 DIAGNOSIS — E039 Hypothyroidism, unspecified: Secondary | ICD-10-CM | POA: Diagnosis present

## 2013-04-20 DIAGNOSIS — L97329 Non-pressure chronic ulcer of left ankle with unspecified severity: Secondary | ICD-10-CM

## 2013-04-20 DIAGNOSIS — I5032 Chronic diastolic (congestive) heart failure: Secondary | ICD-10-CM

## 2013-04-20 DIAGNOSIS — IMO0001 Reserved for inherently not codable concepts without codable children: Secondary | ICD-10-CM

## 2013-04-20 DIAGNOSIS — I4891 Unspecified atrial fibrillation: Secondary | ICD-10-CM

## 2013-04-20 DIAGNOSIS — Z8249 Family history of ischemic heart disease and other diseases of the circulatory system: Secondary | ICD-10-CM

## 2013-04-20 DIAGNOSIS — N039 Chronic nephritic syndrome with unspecified morphologic changes: Secondary | ICD-10-CM

## 2013-04-20 DIAGNOSIS — Z8546 Personal history of malignant neoplasm of prostate: Secondary | ICD-10-CM

## 2013-04-20 DIAGNOSIS — E1169 Type 2 diabetes mellitus with other specified complication: Secondary | ICD-10-CM | POA: Diagnosis present

## 2013-04-20 DIAGNOSIS — S3730XA Unspecified injury of urethra, initial encounter: Secondary | ICD-10-CM | POA: Diagnosis not present

## 2013-04-20 DIAGNOSIS — F1021 Alcohol dependence, in remission: Secondary | ICD-10-CM

## 2013-04-20 DIAGNOSIS — L03116 Cellulitis of left lower limb: Secondary | ICD-10-CM

## 2013-04-20 DIAGNOSIS — N400 Enlarged prostate without lower urinary tract symptoms: Secondary | ICD-10-CM

## 2013-04-20 DIAGNOSIS — N320 Bladder-neck obstruction: Secondary | ICD-10-CM | POA: Diagnosis present

## 2013-04-20 DIAGNOSIS — K922 Gastrointestinal hemorrhage, unspecified: Secondary | ICD-10-CM

## 2013-04-20 DIAGNOSIS — J189 Pneumonia, unspecified organism: Secondary | ICD-10-CM

## 2013-04-20 DIAGNOSIS — C7952 Secondary malignant neoplasm of bone marrow: Secondary | ICD-10-CM

## 2013-04-20 DIAGNOSIS — R05 Cough: Secondary | ICD-10-CM

## 2013-04-20 DIAGNOSIS — H9193 Unspecified hearing loss, bilateral: Secondary | ICD-10-CM

## 2013-04-20 DIAGNOSIS — R310 Gross hematuria: Secondary | ICD-10-CM | POA: Diagnosis present

## 2013-04-20 DIAGNOSIS — E875 Hyperkalemia: Secondary | ICD-10-CM

## 2013-04-20 DIAGNOSIS — Y846 Urinary catheterization as the cause of abnormal reaction of the patient, or of later complication, without mention of misadventure at the time of the procedure: Secondary | ICD-10-CM | POA: Diagnosis not present

## 2013-04-20 DIAGNOSIS — S81812A Laceration without foreign body, left lower leg, initial encounter: Secondary | ICD-10-CM

## 2013-04-20 HISTORY — DX: Anemia, unspecified: D64.9

## 2013-04-20 HISTORY — DX: Chronic kidney disease, stage 3 (moderate): N18.3

## 2013-04-20 HISTORY — DX: Abnormal findings on diagnostic imaging of other specified body structures: R93.89

## 2013-04-20 HISTORY — DX: Hypertensive chronic kidney disease with stage 1 through stage 4 chronic kidney disease, or unspecified chronic kidney disease: I12.9

## 2013-04-20 LAB — CBC
HEMATOCRIT: 18.7 % — AB (ref 39.0–52.0)
HEMOGLOBIN: 5.7 g/dL — AB (ref 13.0–17.0)
MCH: 27.7 pg (ref 26.0–34.0)
MCHC: 30.5 g/dL (ref 30.0–36.0)
MCV: 90.8 fL (ref 78.0–100.0)
PLATELETS: 289 10*3/uL (ref 150–400)
RBC: 2.06 MIL/uL — ABNORMAL LOW (ref 4.22–5.81)
RDW: 23.3 % — AB (ref 11.5–15.5)
WBC: 14.6 10*3/uL — AB (ref 4.0–10.5)

## 2013-04-20 LAB — CBC WITH DIFFERENTIAL/PLATELET
BASOS PCT: 0 % (ref 0–1)
Basophils Absolute: 0 10*3/uL (ref 0.0–0.1)
Eosinophils Absolute: 0.2 10*3/uL (ref 0.0–0.7)
Eosinophils Relative: 2 % (ref 0–5)
HCT: 16.1 % — ABNORMAL LOW (ref 39.0–52.0)
HEMOGLOBIN: 4.8 g/dL — AB (ref 13.0–17.0)
LYMPHS PCT: 9 % — AB (ref 12–46)
Lymphs Abs: 1.1 10*3/uL (ref 0.7–4.0)
MCH: 27.1 pg (ref 26.0–34.0)
MCHC: 29.8 g/dL — AB (ref 30.0–36.0)
MCV: 91 fL (ref 78.0–100.0)
Monocytes Absolute: 0.4 10*3/uL (ref 0.1–1.0)
Monocytes Relative: 3 % (ref 3–12)
NEUTROS ABS: 10.7 10*3/uL — AB (ref 1.7–7.7)
Neutrophils Relative %: 86 % — ABNORMAL HIGH (ref 43–77)
Platelets: 276 10*3/uL (ref 150–400)
RBC: 1.77 MIL/uL — ABNORMAL LOW (ref 4.22–5.81)
RDW: 25.5 % — ABNORMAL HIGH (ref 11.5–15.5)
WBC: 12.4 10*3/uL — ABNORMAL HIGH (ref 4.0–10.5)

## 2013-04-20 LAB — COMPREHENSIVE METABOLIC PANEL
ALK PHOS: 60 U/L (ref 39–117)
ALT: 13 U/L (ref 0–53)
AST: 15 U/L (ref 0–37)
Albumin: 2.9 g/dL — ABNORMAL LOW (ref 3.5–5.2)
BUN: 69 mg/dL — AB (ref 6–23)
CALCIUM: 9.1 mg/dL (ref 8.4–10.5)
CO2: 22 meq/L (ref 19–32)
Chloride: 103 mEq/L (ref 96–112)
Creatinine, Ser: 2.32 mg/dL — ABNORMAL HIGH (ref 0.50–1.35)
GFR, EST AFRICAN AMERICAN: 29 mL/min — AB (ref >90)
GFR, EST NON AFRICAN AMERICAN: 25 mL/min — AB (ref >90)
GLUCOSE: 266 mg/dL — AB (ref 70–99)
Potassium: 4.3 mEq/L (ref 3.7–5.3)
SODIUM: 138 meq/L (ref 137–147)
Total Bilirubin: <0.2 mg/dL — ABNORMAL LOW (ref 0.3–1.2)
Total Protein: 6.1 g/dL (ref 6.0–8.3)

## 2013-04-20 LAB — APTT: aPTT: 30 seconds (ref 24–37)

## 2013-04-20 LAB — URINALYSIS, ROUTINE W REFLEX MICROSCOPIC
BILIRUBIN URINE: NEGATIVE
GLUCOSE, UA: NEGATIVE mg/dL
Hgb urine dipstick: NEGATIVE
KETONES UR: NEGATIVE mg/dL
Leukocytes, UA: NEGATIVE
Nitrite: NEGATIVE
Protein, ur: NEGATIVE mg/dL
Specific Gravity, Urine: 1.012 (ref 1.005–1.030)
Urobilinogen, UA: 0.2 mg/dL (ref 0.0–1.0)
pH: 5 (ref 5.0–8.0)

## 2013-04-20 LAB — PROTIME-INR
INR: 1.06 (ref 0.00–1.49)
PROTHROMBIN TIME: 13.6 s (ref 11.6–15.2)

## 2013-04-20 LAB — PRO B NATRIURETIC PEPTIDE: PRO B NATRI PEPTIDE: 1139 pg/mL — AB (ref 0–450)

## 2013-04-20 LAB — POC OCCULT BLOOD, ED: Fecal Occult Bld: POSITIVE — AB

## 2013-04-20 LAB — I-STAT TROPONIN, ED: TROPONIN I, POC: 0.04 ng/mL (ref 0.00–0.08)

## 2013-04-20 LAB — GLUCOSE, CAPILLARY
Glucose-Capillary: 121 mg/dL — ABNORMAL HIGH (ref 70–99)
Glucose-Capillary: 135 mg/dL — ABNORMAL HIGH (ref 70–99)

## 2013-04-20 LAB — PREPARE RBC (CROSSMATCH)

## 2013-04-20 MED ORDER — SODIUM CHLORIDE 0.9 % IJ SOLN
3.0000 mL | Freq: Two times a day (BID) | INTRAMUSCULAR | Status: DC
  Administered 2013-04-20 – 2013-04-27 (×10): 3 mL via INTRAVENOUS

## 2013-04-20 MED ORDER — SODIUM CHLORIDE 0.9 % IV SOLN
250.0000 mL | INTRAVENOUS | Status: DC | PRN
  Administered 2013-04-21: 12:00:00 via INTRAVENOUS
  Administered 2013-04-25: 250 mL via INTRAVENOUS

## 2013-04-20 MED ORDER — ISOSORBIDE MONONITRATE ER 60 MG PO TB24
60.0000 mg | ORAL_TABLET | Freq: Every day | ORAL | Status: DC
  Administered 2013-04-21 – 2013-04-28 (×8): 60 mg via ORAL
  Filled 2013-04-20 (×8): qty 1

## 2013-04-20 MED ORDER — LEVOFLOXACIN IN D5W 750 MG/150ML IV SOLN: 750.0000 mg | INTRAVENOUS | Status: DC

## 2013-04-20 MED ORDER — CLONIDINE HCL 0.1 MG/24HR TD PTWK
0.1000 mg | MEDICATED_PATCH | TRANSDERMAL | Status: DC
  Administered 2013-04-26: 0.1 mg via TRANSDERMAL
  Filled 2013-04-20: qty 1

## 2013-04-20 MED ORDER — CITALOPRAM HYDROBROMIDE 10 MG PO TABS
10.0000 mg | ORAL_TABLET | Freq: Two times a day (BID) | ORAL | Status: DC
  Administered 2013-04-20 – 2013-04-28 (×16): 10 mg via ORAL
  Filled 2013-04-20 (×18): qty 1

## 2013-04-20 MED ORDER — AZITHROMYCIN 250 MG PO TABS
500.0000 mg | ORAL_TABLET | Freq: Once | ORAL | Status: AC
  Administered 2013-04-20: 500 mg via ORAL
  Filled 2013-04-20: qty 2

## 2013-04-20 MED ORDER — FUROSEMIDE 10 MG/ML IJ SOLN
60.0000 mg | Freq: Once | INTRAMUSCULAR | Status: AC
  Administered 2013-04-20: 60 mg via INTRAVENOUS
  Filled 2013-04-20: qty 8

## 2013-04-20 MED ORDER — ATORVASTATIN CALCIUM 40 MG PO TABS
40.0000 mg | ORAL_TABLET | Freq: Every day | ORAL | Status: DC
  Administered 2013-04-21 – 2013-04-27 (×7): 40 mg via ORAL
  Filled 2013-04-20 (×8): qty 1

## 2013-04-20 MED ORDER — INSULIN DETEMIR 100 UNIT/ML ~~LOC~~ SOLN
50.0000 [IU] | SUBCUTANEOUS | Status: DC
  Administered 2013-04-21 – 2013-04-28 (×8): 50 [IU] via SUBCUTANEOUS
  Filled 2013-04-20 (×9): qty 0.5

## 2013-04-20 MED ORDER — ONDANSETRON HCL 4 MG/2ML IJ SOLN: 4.0000 mg | Freq: Four times a day (QID) | INTRAMUSCULAR | Status: DC | PRN

## 2013-04-20 MED ORDER — AMLODIPINE BESYLATE 10 MG PO TABS
10.0000 mg | ORAL_TABLET | Freq: Every day | ORAL | Status: DC
  Administered 2013-04-21 – 2013-04-28 (×8): 10 mg via ORAL
  Filled 2013-04-20 (×8): qty 1

## 2013-04-20 MED ORDER — AMIODARONE HCL 100 MG PO TABS
100.0000 mg | ORAL_TABLET | Freq: Every morning | ORAL | Status: DC
  Administered 2013-04-21 – 2013-04-28 (×8): 100 mg via ORAL
  Filled 2013-04-20 (×8): qty 1

## 2013-04-20 MED ORDER — MOMETASONE FURO-FORMOTEROL FUM 100-5 MCG/ACT IN AERO: 2.0000 | INHALATION_SPRAY | Freq: Two times a day (BID) | RESPIRATORY_TRACT | Status: DC

## 2013-04-20 MED ORDER — ASPIRIN 325 MG PO TABS
325.0000 mg | ORAL_TABLET | Freq: Every day | ORAL | Status: DC
  Administered 2013-04-21 – 2013-04-22 (×2): 325 mg via ORAL
  Filled 2013-04-20 (×3): qty 1

## 2013-04-20 MED ORDER — ONDANSETRON HCL 4 MG PO TABS: 4.0000 mg | ORAL_TABLET | Freq: Four times a day (QID) | ORAL | Status: DC | PRN

## 2013-04-20 MED ORDER — PANTOPRAZOLE SODIUM 40 MG IV SOLR
40.0000 mg | Freq: Two times a day (BID) | INTRAVENOUS | Status: DC
  Administered 2013-04-20 – 2013-04-22 (×4): 40 mg via INTRAVENOUS
  Filled 2013-04-20 (×5): qty 40

## 2013-04-20 MED ORDER — TORSEMIDE 20 MG PO TABS
60.0000 mg | ORAL_TABLET | Freq: Two times a day (BID) | ORAL | Status: DC
  Administered 2013-04-20 – 2013-04-26 (×12): 60 mg via ORAL
  Filled 2013-04-20 (×14): qty 3

## 2013-04-20 MED ORDER — SODIUM CHLORIDE 0.9 % IJ SOLN
3.0000 mL | INTRAMUSCULAR | Status: DC | PRN
  Administered 2013-04-24 – 2013-04-26 (×4): 3 mL via INTRAVENOUS

## 2013-04-20 MED ORDER — POTASSIUM CHLORIDE CRYS ER 20 MEQ PO TBCR
20.0000 meq | EXTENDED_RELEASE_TABLET | Freq: Every day | ORAL | Status: DC
  Administered 2013-04-21 – 2013-04-28 (×8): 20 meq via ORAL
  Filled 2013-04-20 (×8): qty 1

## 2013-04-20 MED ORDER — MORPHINE SULFATE 2 MG/ML IJ SOLN
2.0000 mg | Freq: Once | INTRAMUSCULAR | Status: AC
  Administered 2013-04-20: 2 mg via INTRAVENOUS
  Filled 2013-04-20: qty 1

## 2013-04-20 MED ORDER — INSULIN ASPART 100 UNIT/ML ~~LOC~~ SOLN
0.0000 [IU] | Freq: Three times a day (TID) | SUBCUTANEOUS | Status: DC
  Administered 2013-04-21: 1 [IU] via SUBCUTANEOUS
  Administered 2013-04-22: 5 [IU] via SUBCUTANEOUS
  Administered 2013-04-22: 3 [IU] via SUBCUTANEOUS
  Administered 2013-04-23 (×2): 2 [IU] via SUBCUTANEOUS
  Administered 2013-04-23: 3 [IU] via SUBCUTANEOUS
  Administered 2013-04-24: 2 [IU] via SUBCUTANEOUS
  Administered 2013-04-24: 3 [IU] via SUBCUTANEOUS
  Administered 2013-04-24: 1 [IU] via SUBCUTANEOUS
  Administered 2013-04-25: 5 [IU] via SUBCUTANEOUS
  Administered 2013-04-25: 3 [IU] via SUBCUTANEOUS
  Administered 2013-04-25 – 2013-04-26 (×2): 1 [IU] via SUBCUTANEOUS
  Administered 2013-04-26 (×2): 2 [IU] via SUBCUTANEOUS
  Administered 2013-04-27: 1 [IU] via SUBCUTANEOUS
  Administered 2013-04-27: 3 [IU] via SUBCUTANEOUS
  Administered 2013-04-28: 1 [IU] via SUBCUTANEOUS

## 2013-04-20 MED ORDER — LEVOFLOXACIN IN D5W 750 MG/150ML IV SOLN
750.0000 mg | INTRAVENOUS | Status: DC
  Administered 2013-04-21 – 2013-04-25 (×3): 750 mg via INTRAVENOUS
  Filled 2013-04-20 (×3): qty 150

## 2013-04-20 MED ORDER — MORPHINE SULFATE 2 MG/ML IJ SOLN
2.0000 mg | INTRAMUSCULAR | Status: DC | PRN
  Administered 2013-04-20 – 2013-04-26 (×3): 2 mg via INTRAVENOUS
  Filled 2013-04-20 (×4): qty 1

## 2013-04-20 MED ORDER — DEXTROSE 5 % IV SOLN
1.0000 g | Freq: Once | INTRAVENOUS | Status: AC
  Administered 2013-04-20: 1 g via INTRAVENOUS
  Filled 2013-04-20: qty 10

## 2013-04-20 MED ORDER — MORPHINE SULFATE 2 MG/ML IJ SOLN: 2.0000 mg | Freq: Once | INTRAMUSCULAR | Status: DC

## 2013-04-20 MED ORDER — LEVOTHYROXINE SODIUM 125 MCG PO TABS
125.0000 ug | ORAL_TABLET | Freq: Every day | ORAL | Status: DC
  Administered 2013-04-21 – 2013-04-28 (×8): 125 ug via ORAL
  Filled 2013-04-20 (×10): qty 1

## 2013-04-20 MED ORDER — HYDRALAZINE HCL 50 MG PO TABS
100.0000 mg | ORAL_TABLET | Freq: Three times a day (TID) | ORAL | Status: DC
  Administered 2013-04-20 – 2013-04-28 (×23): 100 mg via ORAL
  Filled 2013-04-20 (×26): qty 2
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
## 2013-04-20 NOTE — Progress Notes (Signed)
   CARE MANAGEMENT ED NOTE 04/20/2013  Patient:  ZACHERY, NISWANDER   Account Number:  [id number]  Date Initiated:  04/20/2013  Documentation initiated by:  Jackelyn Poling  Subjective/Objective Assessment:   80 yr old [place] medicare choice ppo     Subjective/Objective Assessment Detail:   hgb 4.8 wbc 12.4 BUN 69 breat 2.32 BNP 1139  Pt being followed [facility] since 04/04/13 for [facility] only     Action/Plan:   Action/Plan Detail:   [facility] ED CM noted CM consult. Cm spoke with Cyril Mourning, [facility] coordinator to inform of pt admissionafter speaking with pt who confirms [facility] is her choice of home health agency  Pt to be followed for d/c needs   Anticipated DC Date:  04/23/2013     Status Recommendation to Physician:   Result of Recommendation:    Other ED Services  Consult [facility] [place]  Other  CM consult   [facility] Choice  Resumption Of Svcs/PTA Provider  HOME HEALTH   Choice offered to / List presented to:       [facility] arranged  HH-1 RN      [place].    Status of service:  Completed, signed off  ED Comments:   ED Comments Detail:

## 2013-04-20 NOTE — Consult Note (Signed)
Referring Provider: Dr. Maryan Rued Primary Care Physician:  [name], MD Primary Gastroenterologist:  Dr. Ardis Hughs ?  Reason for Consultation:  Anemia and heme positive stools  HPI: [name] is a 80 y.o. male who has a past medical history of chronic diastolic heart failure, atrial fibrillation, CVA, hyperlipidemia, hypertension, Type II diabetes mellitus, obstructive sleep apnea, iron deficiency anemia, renal insufficiency, prostate cancer, and CAD.   Today presented to the hospital with chief complaint of shortness of breath which has been getting progressively worse for past 2 weeks, particularly in the past 24 hours.  Had extremity weakness.  In the ED patient was found to have severe anemia with hemoglobin of 4.8 grams, last hemoglobin from November 2013 was 9.7 grams.  He is being transfused with PRBC's currently.  Says that stools have been dark for at least a year since being to take the vitamin/supplement with iron in it.  Has about one or two BM's per day.  No red blood in stools as far as he is aware.  No abdominal pain.  He was heme positive in the ED.  No history or liver disease, but does have history of ETOH abuse listed in chart.  No nausea or vomiting.  Says that appetite is good.  Only on ASA 325 mg daily for anti-platelet/anti-coagulation.  Is on prilosec 20 mg daily at home.  Has history of Hpylori in 2010 from biopsies, but ? If this was every treated.   GI procedures listed below.  Procedures in 2010 were performed for heme positive stools and mixed anemia as well.  Colonoscopy 04/2008 by Dr. Ardis Hughs: 1) Internal and external hemorrhoids  2) Otherwise normal examination to the hepatic flexure (incomplete examination despite multiple position changes, abdominal pressure)  EGD in 04/2008 by Dr. Ardis Hughs: 1) Moderate gastritis, biopsied to check for neoplasm/H. Pylori; biopsies confirmed Hpylori at that time  2) Otherwise normal examination  Colonoscopy 04/2004 by Dr. Fuller Plan  showed diverticulosis, hemorrhoids, and hyperplastic colon polyps   Past Medical History  Diagnosis Date  . Chronic diastolic heart failure     secondary to diastolic dysfunction EF (previous EF 35-45%)ef 60%4/09  . Arrhythmia     atrial fibrillation /pt on amiodarone,thought to be poor coumadin  . Stroke   . Other and unspecified hyperlipidemia   . Hypertension   . Hypertrophy of prostate without urinary obstruction and other lower urinary tract symptoms (LUTS)   . Osteoarthrosis, unspecified whether generalized or localized, unspecified site   . Type II or unspecified type diabetes mellitus without mention of complication, not stated as uncontrolled   . AV block, Mobitz 1     Intolerance to ACE's / discontiuation of beta blockers  . Obstructive sleep apnea   . Iron deficiency anemia, unspecified     s/p EGD and coloscopy 3/10.gastritis.hemorrhids  . Cerebrovascular accident   . Renal insufficiency   . Obesity   . Allergic rhinitis, cause unspecified 05/29/2010  . Prostate cancer   . CAD (coronary artery disease)     cath 12/04: mLAD 50-60%, mCFX 20-30%, pRCA 50-60%, mRCA 40%    Past Surgical History  Procedure Laterality Date  . Prostate surgery    . Prostate surgury      hx of    Prior to Admission medications   Medication Sig Start Date End Date Taking? Authorizing Provider  allopurinol (ZYLOPRIM) 100 MG tablet Take 1 tablet (100 mg total) by mouth at bedtime. 04/23/12  Yes Jolaine Artist, MD  amiodarone (Robinson)  200 MG tablet Take 0.5 tablets (100 mg total) by mouth every morning. 03/19/13  Yes Shaune Pascal Bensimhon, MD  amLODipine (NORVASC) 10 MG tablet Take 1 tablet (10 mg total) by mouth daily. 02/16/13  Yes Jolaine Artist, MD  aspirin 325 MG tablet Take 1 tablet (325 mg total) by mouth daily. 11/13/10  Yes Shaune Pascal Bensimhon, MD  atorvastatin (LIPITOR) 40 MG tablet TAKE 1 TABLET BY MOUTH EVERY DAY 03/14/13  Yes Jolaine Artist, MD  citalopram (CELEXA) 10 MG  tablet Take 10 mg by mouth 2 (two) times daily. 07/29/12  Yes Biagio Borg, MD  cloNIDine (CATAPRES - DOSED IN MG/24 HR) 0.1 mg/24hr patch Place 1 patch (0.1 mg total) onto the skin once a week. Change on sundays 11/17/12  Yes Jolaine Artist, MD  Fe Fum-Vit C-Vit B12-FA (TRIGELS-F) 460-60-0.01-1 MG CAPS capsule Take 1 capsule by mouth daily. 02/16/13  Yes Shaune Pascal Bensimhon, MD  Fluticasone-Salmeterol (ADVAIR DISKUS) 100-50 MCG/DOSE AEPB Inhale 1 puff into the lungs 2 (two) times daily. 06/05/12  Yes Renato Shin, MD  hydrALAZINE (APRESOLINE) 100 MG tablet TAKE 1 TABLET BY MOUTH 3 TIMES A DAY 11/27/12  Yes Jolaine Artist, MD  insulin detemir (LEVEMIR FLEXPEN) 100 UNIT/ML injection Inject 1.25 mLs (125 Units total) into the skin every morning. And pen needles 1/day 06/05/12  Yes Renato Shin, MD  isosorbide mononitrate (IMDUR) 60 MG 24 hr tablet Take 1 tablet (60 mg total) by mouth daily. 07/28/12  Yes Jolaine Artist, MD  levothyroxine (SYNTHROID, LEVOTHROID) 125 MCG tablet TAKE 1 TABLET BY MOUTH ONCE EVERY DAY 08/18/12  Yes Biagio Borg, MD  metFORMIN (GLUMETZA) 500 MG (MOD) 24 hr tablet Take 500 mg by mouth daily with breakfast. 06/10/12  Yes Historical Provider, MD  metolazone (ZAROXOLYN) 2.5 MG tablet Take 1 tablet (2.5 mg total) by mouth as needed. 04/03/13  Yes Amy D Ninfa Meeker, NP  mupirocin ointment (BACTROBAN) 2 % Use qd with dressing change 09/12/12  Yes Evie Lacks Plotnikov, MD  omeprazole (PRILOSEC) 20 MG capsule Take 1 capsule (20 mg total) by mouth daily. 07/22/12  Yes Shaune Pascal Bensimhon, MD  potassium chloride SA (K-DUR,KLOR-CON) 20 MEQ tablet Take 1 tablet (20 mEq total) by mouth daily. 02/16/13  Yes Jolaine Artist, MD  torsemide (DEMADEX) 20 MG tablet Take 3 tablets (60 mg total) by mouth 2 (two) times daily. 04/03/13  Yes Amy D Clegg, NP    Current Facility-Administered Medications  Medication Dose Route Frequency Provider Last Rate Last Dose  . furosemide (LASIX) injection 60 mg  60  mg Intravenous Once Blanchie Dessert, MD       Current Outpatient Prescriptions  Medication Sig Dispense Refill  . allopurinol (ZYLOPRIM) 100 MG tablet Take 1 tablet (100 mg total) by mouth at bedtime.  30 tablet  30  . amiodarone (PACERONE) 200 MG tablet Take 0.5 tablets (100 mg total) by mouth every morning.  15 tablet  6  . amLODipine (NORVASC) 10 MG tablet Take 1 tablet (10 mg total) by mouth daily.  30 tablet  6  . aspirin 325 MG tablet Take 1 tablet (325 mg total) by mouth daily.  30 tablet  6  . atorvastatin (LIPITOR) 40 MG tablet TAKE 1 TABLET BY MOUTH EVERY DAY  30 tablet  6  . citalopram (CELEXA) 10 MG tablet Take 10 mg by mouth 2 (two) times daily.      . cloNIDine (CATAPRES - DOSED IN MG/24 HR) 0.1 mg/24hr patch  Place 1 patch (0.1 mg total) onto the skin once a week. Change on sundays  4 patch  6  . Fe Fum-Vit C-Vit B12-FA (TRIGELS-F) 460-60-0.01-1 MG CAPS capsule Take 1 capsule by mouth daily.  30 capsule  6  . Fluticasone-Salmeterol (ADVAIR DISKUS) 100-50 MCG/DOSE AEPB Inhale 1 puff into the lungs 2 (two) times daily.  1 each  0  . hydrALAZINE (APRESOLINE) 100 MG tablet TAKE 1 TABLET BY MOUTH 3 TIMES A DAY  90 tablet  6  . insulin detemir (LEVEMIR FLEXPEN) 100 UNIT/ML injection Inject 1.25 mLs (125 Units total) into the skin every morning. And pen needles 1/day  45 mL  12  . isosorbide mononitrate (IMDUR) 60 MG 24 hr tablet Take 1 tablet (60 mg total) by mouth daily.  90 tablet  3  . levothyroxine (SYNTHROID, LEVOTHROID) 125 MCG tablet TAKE 1 TABLET BY MOUTH ONCE EVERY DAY  30 tablet  11  . metFORMIN (GLUMETZA) 500 MG (MOD) 24 hr tablet Take 500 mg by mouth daily with breakfast.      . metolazone (ZAROXOLYN) 2.5 MG tablet Take 1 tablet (2.5 mg total) by mouth as needed.  8 tablet  6  . mupirocin ointment (BACTROBAN) 2 % Use qd with dressing change  30 g  0  . omeprazole (PRILOSEC) 20 MG capsule Take 1 capsule (20 mg total) by mouth daily.  30 capsule  11  . potassium chloride SA  (K-DUR,KLOR-CON) 20 MEQ tablet Take 1 tablet (20 mEq total) by mouth daily.  30 tablet  6  . torsemide (DEMADEX) 20 MG tablet Take 3 tablets (60 mg total) by mouth 2 (two) times daily.  180 tablet  6    Allergies as of 04/20/2013 - Review Complete 04/20/2013  Allergen Reaction Noted  . Ace inhibitors Other (See Comments) 04/12/2008  . Beta adrenergic blockers Other (See Comments) 04/12/2008    Family History  Problem Relation Age of Onset  . Heart disease Father     History   Social History  . Marital Status: Divorced    Spouse Name: N/A    Number of Children: N/A  . Years of Education: N/A   Occupational History  . Not on file.   Social History Main Topics  . Smoking status: Former Research scientist (life sciences)  . Smokeless tobacco: Never Used  . Alcohol Use: No     Comment: history of abuse  . Drug Use: No  . Sexual Activity: No   Other Topics Concern  . Not on file   Social History Narrative   Regular exercise - no ambulates w/cane or walker    Review of Systems: Ten point ROS is O/W negative except as mentioned in HPI.  Physical Exam: Vital signs in last 24 hours: Temp:  [98.5 F (36.9 C)] 98.5 F (36.9 C) (03/02 1006) Pulse Rate:  [87] 87 (03/02 1006) Resp:  [19] 19 (03/02 1006) BP: (139)/(65) 139/65 mmHg (03/02 1006) SpO2:  [97 %-100 %] 100 % (03/02 1006)   General:   Alert, elderly, pleasant and cooperative in NAD Head:  Normocephalic and atraumatic. Eyes:  Sclera clear, no icterus.  Conjunctiva pale. Ears:  Normal auditory acuity. Mouth:  No deformity or lesions.  Lungs:  Clear throughout to auscultation.  No wheezes, crackles, or rhonchi.  Heart:  Regular rate and rhythm. Abdomen:  Soft, but seems distended.  Normal bowel sounds.  Non-tender.   Rectal:  Deferred.  Was heme positive in the ED.  Msk:  Symmetrical without gross deformities.  Pulses:  Normal pulses noted. Extremities:  1+ pitting edema in B/L LE's.  Some skin changes noted as well. Neurologic:  Alert  and  oriented x4;  grossly normal neurologically.  Speech somewhat difficult to understand. Skin:  Intact without significant lesions or rashes. Psych:  Alert and cooperative. Normal mood and affect.  Lab Results:  Recent Labs  04/20/13 1116  WBC 12.4*  HGB 4.8*  HCT 16.1*  PLT 276   BMET  Recent Labs  04/20/13 1116  NA 138  K 4.3  CL 103  CO2 22  GLUCOSE 266*  BUN 69*  CREATININE 2.32*  CALCIUM 9.1   LFT  Recent Labs  04/20/13 1116  PROT 6.1  ALBUMIN 2.9*  AST 15  ALT 13  ALKPHOS 60  BILITOT <0.2*   Studies/Results: Dg Chest 2 View  04/20/2013   CLINICAL DATA:  Shortness of breath, weakness, history of atrial fibrillation, history of prostate can't  EXAM: CHEST  2 VIEW  COMPARISON:  DG CHEST 2 VIEW dated 06/05/2012; CT ANGIO CHEST W/CM &/OR WO/CM dated 09/23/2010  FINDINGS: Mild to moderate cardiac enlargement. Mild vascular congestion. Abnormal opacity over the right perihilar area extending laterally into the right middle lobe. There is a somewhat nodular component to the airspace opacity in the lateral right mid lung zone. There was density in this area previously, but the current opacity appears more pronounced.  IMPRESSION: Abnormal right hilar contour and middle lobe opacity. Adenopathy, mass, and pneumonia are all considerations. It is possible that this represents a progressive chronic fibrotic process. Consider evaluation contrast-enhanced CT.   Electronically Signed   By: Skipper Cliche M.D.   On: 04/20/2013 11:25    IMPRESSION:  -Symptomatic anemia:  Acute on chronic.  Hgb 4.8 grams today.  Heme positive stools.  Rule out GIB. -CHF, atrial fibrillation, and CAD:  Only anti-coagulant is ASA 325 mg daily. -DM -CKD   PLAN: -Recommend ICU or step-down monitoring given his comorbidities. -Agree with transfusion and recommend keeping Hgb >8.0 grams, but need to be careful in the setting of CHF. -IV PPI BID. -Will keep him NPO and reassess in the AM for  possible EGD.   ZEHR, JESSICA D.  04/20/2013, 1:52 PM  Pager number [phone]  GI ATTENDING  History, laboratories, x-rays, prior endoscopy reports reviewed. Patient seen and examined. Agree with H&P as outlined above. Patient has chronic anemia. Previous workups for anemia and Hemoccult-positive stool as outlined. Now with acute on chronic anemia. Suspect he has had interval upper GI bleed. Hemodynamically stable. Multiple significant comorbidities. Agree with PPI and careful transfusion. They have AVMs. Would like to perform endoscopy, upper, and possible small bowel enteroscopy when appropriate. We will reassess in a.m. Thank you  Docia Chuck. Geri Seminole., M.D. [facility] Division of Gastroenterology

## 2013-04-20 NOTE — Progress Notes (Signed)
Utilization Review completed.  Teola Felipe RN CM  

## 2013-04-20 NOTE — ED Notes (Signed)
Bed: YT24 Expected date:  Expected time:  Means of arrival:  Comments: ems- 80 yo M, cough

## 2013-04-20 NOTE — Progress Notes (Addendum)
Shift event: RN paged this NP secondary to pt bleeding bright red blood and passing clots from penile meatus after Foley placement. RN says she was able to put the Foley in with return of yellow urine, but after inflating balloon, pt began to have bleeding from the penis. Foley was then removed by RN secondary to c/o pain from pt.  Brief HPI: Pt admitted by [facility] today with severe anemia secondary to GIB with an admission Hgb of 4.8. Also, PNA. Per chart, baseline Hgb is around 9. Pt has hx prostate cancer s/p prostatectomy, CHF, DM II, stroke Mobitz I AV block, OSA, anemia, CAD and LUTS. He takes 325mg  ASA at home and is on no other blood thinning agents.  NP immediately to bedside. S: Says he is having pain to the lower abd "on and off". No other pain. He is a poor historian.  O: VS reviewed, no tachycardia, no hypotension. Appears chronically ill but fairly well. Alert. Appropriate to situation. Bright red blood in steady stream from meatus with clots x 2 noted. Lower abd distended.  A/P: 1. Bleeding from penile meatus after Foley placement. Likely trauma. Urology-Dr. Junious Silk- called around 2140 for urgent consult. Cysto/catheter cart from OR placed at bedside per MD request. Additional pain med given but pain is likely from a full bladder at this point. Plan discussed with pt and family at bedside. Stat CBC, PT/INR/PTT ordered. 2. Severe anemia with GIB-ASA on hold. He is s/p one unit of PRBCs, 2nd unit to start soon.  Will continue to follow.  Clance Boll, NP [facility] Note: Above time of 2140 for calling Dr. Junious Silk is wrong, actual time of call was 1940. KJKG, NP

## 2013-04-20 NOTE — Consult Note (Signed)
Consult: gross hematuria, difficult foley Requested by: Clance Boll, NP   History of Present Illness: 80 yo admitted for SOB and severe anemia. Foley placed, bleeding around foley noted, then foley removed. He has a history of radical retropubic prostatectomy for prostate cancer in 2009. Last PSA in 2005 was up slightly at 0.13. I did not see a recent PSA.   Foley ordered at 5:51 pm. UA clear. Cr stable at 2.3.   He reports he typically voids with a good stream.  He denies prior gross hematuria. He currently complains of the severe need to urinate and trouble voiding.  Past Medical History  Diagnosis Date  . Chronic diastolic heart failure     secondary to diastolic dysfunction EF (previous EF 35-45%)ef 60%4/09  . Arrhythmia     atrial fibrillation /pt on amiodarone,thought to be poor coumadin  . Stroke   . Other and unspecified hyperlipidemia   . Hypertension   . Hypertrophy of prostate without urinary obstruction and other lower urinary tract symptoms (LUTS)   . Osteoarthrosis, unspecified whether generalized or localized, unspecified site   . Type II or unspecified type diabetes mellitus without mention of complication, not stated as uncontrolled   . AV block, Mobitz 1     Intolerance to ACE's / discontiuation of beta blockers  . Obstructive sleep apnea   . Iron deficiency anemia, unspecified     s/p EGD and coloscopy 3/10.gastritis.hemorrhids  . Cerebrovascular accident   . Renal insufficiency   . Obesity   . Allergic rhinitis, cause unspecified 05/29/2010  . Prostate cancer   . CAD (coronary artery disease)     cath 12/04: mLAD 50-60%, mCFX 20-30%, pRCA 50-60%, mRCA 40%   Past Surgical History  Procedure Laterality Date  . Prostate surgery    . Prostate surgury      hx of    Home Medications:  Prescriptions prior to admission  Medication Sig Dispense Refill  . allopurinol (ZYLOPRIM) 100 MG tablet Take 1 tablet (100 mg total) by mouth at bedtime.  30 tablet  30   . amiodarone (PACERONE) 200 MG tablet Take 0.5 tablets (100 mg total) by mouth every morning.  15 tablet  6  . amLODipine (NORVASC) 10 MG tablet Take 1 tablet (10 mg total) by mouth daily.  30 tablet  6  . aspirin 325 MG tablet Take 1 tablet (325 mg total) by mouth daily.  30 tablet  6  . atorvastatin (LIPITOR) 40 MG tablet TAKE 1 TABLET BY MOUTH EVERY DAY  30 tablet  6  . citalopram (CELEXA) 10 MG tablet Take 10 mg by mouth 2 (two) times daily.      . cloNIDine (CATAPRES - DOSED IN MG/24 HR) 0.1 mg/24hr patch Place 1 patch (0.1 mg total) onto the skin once a week. Change on sundays  4 patch  6  . Fe Fum-Vit C-Vit B12-FA (TRIGELS-F) 460-60-0.01-1 MG CAPS capsule Take 1 capsule by mouth daily.  30 capsule  6  . Fluticasone-Salmeterol (ADVAIR DISKUS) 100-50 MCG/DOSE AEPB Inhale 1 puff into the lungs 2 (two) times daily.  1 each  0  . hydrALAZINE (APRESOLINE) 100 MG tablet TAKE 1 TABLET BY MOUTH 3 TIMES A DAY  90 tablet  6  . insulin detemir (LEVEMIR FLEXPEN) 100 UNIT/ML injection Inject 1.25 mLs (125 Units total) into the skin every morning. And pen needles 1/day  45 mL  12  . isosorbide mononitrate (IMDUR) 60 MG 24 hr tablet Take 1 tablet (60 mg total)  by mouth daily.  90 tablet  3  . levothyroxine (SYNTHROID, LEVOTHROID) 125 MCG tablet TAKE 1 TABLET BY MOUTH ONCE EVERY DAY  30 tablet  11  . metFORMIN (GLUMETZA) 500 MG (MOD) 24 hr tablet Take 500 mg by mouth daily with breakfast.      . metolazone (ZAROXOLYN) 2.5 MG tablet Take 1 tablet (2.5 mg total) by mouth as needed.  8 tablet  6  . mupirocin ointment (BACTROBAN) 2 % Use qd with dressing change  30 g  0  . omeprazole (PRILOSEC) 20 MG capsule Take 1 capsule (20 mg total) by mouth daily.  30 capsule  11  . potassium chloride SA (K-DUR,KLOR-CON) 20 MEQ tablet Take 1 tablet (20 mEq total) by mouth daily.  30 tablet  6  . torsemide (DEMADEX) 20 MG tablet Take 3 tablets (60 mg total) by mouth 2 (two) times daily.  180 tablet  6   Allergies:   Allergies  Allergen Reactions  . Ace Inhibitors Other (See Comments)    me  hyposion  . Beta Adrenergic Blockers Other (See Comments)     use cautiously secondary to 2nd degree heart block, hypotension    Family History  Problem Relation Age of Onset  . Heart disease Father    Social History:  reports that he has quit smoking. He has never used smokeless tobacco. He reports that he does not drink alcohol or use illicit drugs.  ROS: A complete review of systems was performed.  All systems are negative except for pertinent findings as noted. Review of Systems  All other systems reviewed and are negative.     Physical Exam:  Vital signs in last 24 hours: Temp:  [98 F (36.7 C)-98.8 F (37.1 C)] 98.7 F (37.1 C) (03/02 2015) Pulse Rate:  [68-90] 68 (03/02 2015) Resp:  [14-23] 22 (03/02 2015) BP: (134-155)/(45-70) 138/54 mmHg (03/02 2015) SpO2:  [92 %-100 %] 94 % (03/02 2015) Weight:  [109.5 kg (241 lb 6.5 oz)] 109.5 kg (241 lb 6.5 oz) (03/02 1652) General:  Alert and oriented, No acute distress HEENT: Normocephalic, atraumatic Neck: No JVD or lymphadenopathy Cardiovascular: Regular rate and rhythm Lungs: Regular rate and effort Abdomen: Soft, nontender, nondistended, no abdominal masses; I cannot palpate his bladder because his abdomen is quite large.  It is a difficult exam. Back: No CVA tenderness Extremities: No edema Neurologic: Grossly intact GU: No foley catheter. Large clots per urethra, followed by squirts of bloody urine.  I suspect based on his voiding pattern he is in overflow incontinence.  Laboratory Data:  Results for orders placed during the hospital encounter of 04/20/13 (from the past 24 hour(s))  CBC WITH DIFFERENTIAL     Status: Abnormal   Collection Time    04/20/13 11:16 AM      Result Value Ref Range   WBC 12.4 (*) 4.0 - 10.5 K/uL   RBC 1.77 (*) 4.22 - 5.81 MIL/uL   Hemoglobin 4.8 (*) 13.0 - 17.0 g/dL   HCT 34.0 (*) 37.0 - 96.4 %   MCV 91.0   78.0 - 100.0 fL   MCH 27.1  26.0 - 34.0 pg   MCHC 29.8 (*) 30.0 - 36.0 g/dL   RDW 38.3 (*) 81.8 - 40.3 %   Platelets 276  150 - 400 K/uL   Neutrophils Relative % 86 (*) 43 - 77 %   Lymphocytes Relative 9 (*) 12 - 46 %   Monocytes Relative 3  3 - 12 %  Eosinophils Relative 2  0 - 5 %   Basophils Relative 0  0 - 1 %   Neutro Abs 10.7 (*) 1.7 - 7.7 K/uL   Lymphs Abs 1.1  0.7 - 4.0 K/uL   Monocytes Absolute 0.4  0.1 - 1.0 K/uL   Eosinophils Absolute 0.2  0.0 - 0.7 K/uL   Basophils Absolute 0.0  0.0 - 0.1 K/uL   RBC Morphology POLYCHROMASIA PRESENT     WBC Morphology MILD LEFT SHIFT (1-5% METAS, OCC MYELO, OCC BANDS)    COMPREHENSIVE METABOLIC PANEL     Status: Abnormal   Collection Time    04/20/13 11:16 AM      Result Value Ref Range   Sodium 138  137 - 147 mEq/L   Potassium 4.3  3.7 - 5.3 mEq/L   Chloride 103  96 - 112 mEq/L   CO2 22  19 - 32 mEq/L   Glucose, Bld 266 (*) 70 - 99 mg/dL   BUN 69 (*) 6 - 23 mg/dL   Creatinine, Ser 2.32 (*) 0.50 - 1.35 mg/dL   Calcium 9.1  8.4 - 10.5 mg/dL   Total Protein 6.1  6.0 - 8.3 g/dL   Albumin 2.9 (*) 3.5 - 5.2 g/dL   AST 15  0 - 37 U/L   ALT 13  0 - 53 U/L   Alkaline Phosphatase 60  39 - 117 U/L   Total Bilirubin <0.2 (*) 0.3 - 1.2 mg/dL   GFR calc non Af Amer 25 (*) >90 mL/min   GFR calc Af Amer 29 (*) >90 mL/min  PRO B NATRIURETIC PEPTIDE     Status: Abnormal   Collection Time    04/20/13 11:16 AM      Result Value Ref Range   Pro B Natriuretic peptide (BNP) 1139.0 (*) 0 - 450 pg/mL  I-STAT TROPOININ, ED     Status: None   Collection Time    04/20/13 11:22 AM      Result Value Ref Range   Troponin i, poc 0.04  0.00 - 0.08 ng/mL   Comment 3           POC OCCULT BLOOD, ED     Status: Abnormal   Collection Time    04/20/13 11:55 AM      Result Value Ref Range   Fecal Occult Bld POSITIVE (*) NEGATIVE  TYPE AND SCREEN     Status: None   Collection Time    04/20/13 12:00 PM      Result Value Ref Range   ABO/RH(D) O POS      Antibody Screen NEG     Sample Expiration 04/23/2013     Unit Number [id number]     Blood Component Type RED CELLS,LR     Unit division 00     Status of Unit ISSUED     Transfusion Status OK TO TRANSFUSE     Crossmatch Result Compatible     Unit Number [id number]     Blood Component Type RBC LR PHER1     Unit division 00     Status of Unit ISSUED     Transfusion Status OK TO TRANSFUSE     Crossmatch Result Compatible    PREPARE RBC (CROSSMATCH)     Status: None   Collection Time    04/20/13 12:00 PM      Result Value Ref Range   Order Confirmation ORDER PROCESSED BY BLOOD BANK    URINALYSIS, ROUTINE W REFLEX MICROSCOPIC  Status: None   Collection Time    04/20/13 12:45 PM      Result Value Ref Range   Color, Urine YELLOW  YELLOW   APPearance CLEAR  CLEAR   Specific Gravity, Urine 1.012  1.005 - 1.030   pH 5.0  5.0 - 8.0   Glucose, UA NEGATIVE  NEGATIVE mg/dL   Hgb urine dipstick NEGATIVE  NEGATIVE   Bilirubin Urine NEGATIVE  NEGATIVE   Ketones, ur NEGATIVE  NEGATIVE mg/dL   Protein, ur NEGATIVE  NEGATIVE mg/dL   Urobilinogen, UA 0.2  0.0 - 1.0 mg/dL   Nitrite NEGATIVE  NEGATIVE   Leukocytes, UA NEGATIVE  NEGATIVE  GLUCOSE, CAPILLARY     Status: Abnormal   Collection Time    04/20/13  6:39 PM      Result Value Ref Range   Glucose-Capillary 121 (*) 70 - 99 mg/dL   Comment 1 Notify RN     No results found for this or any previous visit (from the past 240 hour(s)). Creatinine:  Recent Labs  04/20/13 1116  CREATININE 2.32*    Impression/Assessment/Plan:  -gross hematuria -h/o prostate cancer s/p radical prostatectomy -Incontinence  Discussed with the patient, his daughter and grandson in nature risks benefits and alternatives to cystoscopy and Foley catheter placement.  All questions answered and they elected to proceed.  I recommended a Foley catheter given the need for accurate urine output, tamponade any bleeding, relieve retention. They elect to  proceed.   Fredricka Bonine 04/20/2013, 8:31 PM

## 2013-04-20 NOTE — ED Notes (Signed)
Lab called with critical hemoglobin 4.8, pt's RN [employer] notified

## 2013-04-20 NOTE — Progress Notes (Signed)
[facility]  Patient Status: Active (receiving services up to time of hospitalization)  [facility] is providing the following services: RN  If patient discharges after hours, please call [phone].   [name] 04/20/2013, 4:13 PM

## 2013-04-20 NOTE — Progress Notes (Signed)
ANTIBIOTIC CONSULT NOTE - INITIAL  Pharmacy Consult for Levaquin Indication: CAP  Allergies  Allergen Reactions  . Ace Inhibitors Other (See Comments)    me  hyposion  . Beta Adrenergic Blockers Other (See Comments)     use cautiously secondary to 2nd degree heart block, hypotension    Patient Measurements: Height: 5\' 10"  (177.8 cm) Weight: 241 lb 6.5 oz (109.5 kg) IBW/kg (Calculated) : 73  Vital Signs: Temp: 98.5 F (36.9 C) (03/02 1652) Temp src: Oral (03/02 1652) BP: 152/65 mmHg (03/02 1652) Pulse Rate: 85 (03/02 1652) Intake/Output from previous day:   Intake/Output from this shift: Total I/O In: -  Out: 275 [Urine:275]  Labs:  Recent Labs  04/20/13 1116  WBC 12.4*  HGB 4.8*  PLT 276  CREATININE 2.32*   Estimated Creatinine Clearance: 32.5 ml/min (by C-G formula based on Cr of 2.32). No results found for this basename: VANCOTROUGH, VANCOPEAK, VANCORANDOM, GENTTROUGH, GENTPEAK, GENTRANDOM, TOBRATROUGH, TOBRAPEAK, TOBRARND, AMIKACINPEAK, AMIKACINTROU, AMIKACIN,  in the last 72 hours   Microbiology: No results found for this or any previous visit (from the past 720 hour(s)).  Medical History: Past Medical History  Diagnosis Date  . Chronic diastolic heart failure     secondary to diastolic dysfunction EF (previous EF 35-45%)ef 60%4/09  . Arrhythmia     atrial fibrillation /pt on amiodarone,thought to be poor coumadin  . Stroke   . Other and unspecified hyperlipidemia   . Hypertension   . Hypertrophy of prostate without urinary obstruction and other lower urinary tract symptoms (LUTS)   . Osteoarthrosis, unspecified whether generalized or localized, unspecified site   . Type II or unspecified type diabetes mellitus without mention of complication, not stated as uncontrolled   . AV block, Mobitz 1     Intolerance to ACE's / discontiuation of beta blockers  . Obstructive sleep apnea   . Iron deficiency anemia, unspecified     s/p EGD and coloscopy  3/10.gastritis.hemorrhids  . Cerebrovascular accident   . Renal insufficiency   . Obesity   . Allergic rhinitis, cause unspecified 05/29/2010  . Prostate cancer   . CAD (coronary artery disease)     cath 12/04: mLAD 50-60%, mCFX 20-30%, pRCA 50-60%, mRCA 40%    Medications:  Scheduled:  . [START ON 04/21/2013] amiodarone  100 mg Oral q morning - 10a  . amLODipine  10 mg Oral Daily  . aspirin  325 mg Oral Daily  . atorvastatin  40 mg Oral q1800  . citalopram  10 mg Oral BID  . cloNIDine  0.1 mg Transdermal Weekly  . hydrALAZINE  100 mg Oral 3 times per day  . [START ON 04/21/2013] insulin aspart  0-9 Units Subcutaneous TID WC  . [START ON 04/21/2013] insulin detemir  125 Units Subcutaneous BH-q7a  . isosorbide mononitrate  60 mg Oral Daily  . [START ON 04/21/2013] levothyroxine  125 mcg Oral QAC breakfast  . mometasone-formoterol  2 puff Inhalation BID  . pantoprazole (PROTONIX) IV  40 mg Intravenous Q12H  . potassium chloride SA  20 mEq Oral Daily  . sodium chloride  3 mL Intravenous Q12H  . torsemide  60 mg Oral BID   Infusions:   Assessment: 80 yo male presents to ED on 3/2 with shortness of breath and Hgb of 4.8.  Goal of Therapy:  Eradication of infection Dose per renal function  Plan:   Levaquin 750mg  IV q48h - will begin on 3/3 since patient received rocephin and zithromax in ED at 16:00  Follow up renal function & cultures, transition to [place], PharmD, BCPS Pager: [phone] 04/20/2013,6:00 PM

## 2013-04-20 NOTE — ED Notes (Signed)
Pt. Tried to use the urinal and was unsuccessful, used the bathroom on the bed, so he aware that we need another specimen.

## 2013-04-20 NOTE — H&P (Addendum)
PCP:   [name], MD   Chief Complaint:  Shortness of breath  HPI:  80 year old male who  has a past medical history of Chronic diastolic heart failure; Arrhythmia; Stroke; Other and unspecified hyperlipidemia; Hypertension; Hypertrophy of prostate without urinary obstruction and other lower urinary tract symptoms (LUTS); Osteoarthrosis, unspecified whether generalized or localized, unspecified site; Type II or unspecified type diabetes mellitus without mention of complication, not stated as uncontrolled; AV block, Mobitz 1; Obstructive sleep apnea; Iron deficiency anemia, unspecified; Cerebrovascular accident; Renal insufficiency; Obesity; Allergic rhinitis, cause unspecified (05/29/2010); Prostate cancer; and CAD (coronary artery disease). Today presented to the hospital with chief complaint of shortness of breath which has been going on for past 2 weeks. In the last 24 hours the shortness of breath has worsened. Patient does have a history of diastolic heart failure, and is followed by heart failure clinic as outpatient. He denies chest pain, no nausea vomiting or diarrhea. Patient is usually wheelchair-bound but is able to walk with a walker. In the ED patient was found to have severe anemia with hemoglobin of 4.8, last hemoglobin from November 2013 was 9.7. Patient had colonoscopy and endoscopy in 2010, which only showed internal and external hemorrhoids, EGD showed moderate gastritis. 2 units of blood transfusion has been ordered by the physician. Patient denies passing out to, no dizziness but admits to having generalized weakness.  Allergies:   Allergies  Allergen Reactions  . Ace Inhibitors Other (See Comments)    me  hyposion  . Beta Adrenergic Blockers Other (See Comments)     use cautiously secondary to 2nd degree heart block, hypotension      Past Medical History  Diagnosis Date  . Chronic diastolic heart failure     secondary to diastolic dysfunction EF (previous EF 35-45%)ef  60%4/09  . Arrhythmia     atrial fibrillation /pt on amiodarone,thought to be poor coumadin  . Stroke   . Other and unspecified hyperlipidemia   . Hypertension   . Hypertrophy of prostate without urinary obstruction and other lower urinary tract symptoms (LUTS)   . Osteoarthrosis, unspecified whether generalized or localized, unspecified site   . Type II or unspecified type diabetes mellitus without mention of complication, not stated as uncontrolled   . AV block, Mobitz 1     Intolerance to ACE's / discontiuation of beta blockers  . Obstructive sleep apnea   . Iron deficiency anemia, unspecified     s/p EGD and coloscopy 3/10.gastritis.hemorrhids  . Cerebrovascular accident   . Renal insufficiency   . Obesity   . Allergic rhinitis, cause unspecified 05/29/2010  . Prostate cancer   . CAD (coronary artery disease)     cath 12/04: mLAD 50-60%, mCFX 20-30%, pRCA 50-60%, mRCA 40%    Past Surgical History  Procedure Laterality Date  . Prostate surgery    . Prostate surgury      hx of    Prior to Admission medications   Medication Sig Start Date End Date Taking? Authorizing Provider  allopurinol (ZYLOPRIM) 100 MG tablet Take 1 tablet (100 mg total) by mouth at bedtime. 04/23/12  Yes Jolaine Artist, MD  amiodarone (PACERONE) 200 MG tablet Take 0.5 tablets (100 mg total) by mouth every morning. 03/19/13  Yes Shaune Pascal Bensimhon, MD  amLODipine (NORVASC) 10 MG tablet Take 1 tablet (10 mg total) by mouth daily. 02/16/13  Yes Jolaine Artist, MD  aspirin 325 MG tablet Take 1 tablet (325 mg total) by mouth daily. 11/13/10  Yes  Jolaine Artist, MD  atorvastatin (LIPITOR) 40 MG tablet TAKE 1 TABLET BY MOUTH EVERY DAY 03/14/13  Yes Jolaine Artist, MD  citalopram (CELEXA) 10 MG tablet Take 10 mg by mouth 2 (two) times daily. 07/29/12  Yes Biagio Borg, MD  cloNIDine (CATAPRES - DOSED IN MG/24 HR) 0.1 mg/24hr patch Place 1 patch (0.1 mg total) onto the skin once a week. Change on sundays  11/17/12  Yes Jolaine Artist, MD  Fe Fum-Vit C-Vit B12-FA (TRIGELS-F) 460-60-0.01-1 MG CAPS capsule Take 1 capsule by mouth daily. 02/16/13  Yes Shaune Pascal Bensimhon, MD  Fluticasone-Salmeterol (ADVAIR DISKUS) 100-50 MCG/DOSE AEPB Inhale 1 puff into the lungs 2 (two) times daily. 06/05/12  Yes Renato Shin, MD  hydrALAZINE (APRESOLINE) 100 MG tablet TAKE 1 TABLET BY MOUTH 3 TIMES A DAY 11/27/12  Yes Jolaine Artist, MD  insulin detemir (LEVEMIR FLEXPEN) 100 UNIT/ML injection Inject 1.25 mLs (125 Units total) into the skin every morning. And pen needles 1/day 06/05/12  Yes Renato Shin, MD  isosorbide mononitrate (IMDUR) 60 MG 24 hr tablet Take 1 tablet (60 mg total) by mouth daily. 07/28/12  Yes Jolaine Artist, MD  levothyroxine (SYNTHROID, LEVOTHROID) 125 MCG tablet TAKE 1 TABLET BY MOUTH ONCE EVERY DAY 08/18/12  Yes Biagio Borg, MD  metFORMIN (GLUMETZA) 500 MG (MOD) 24 hr tablet Take 500 mg by mouth daily with breakfast. 06/10/12  Yes Historical Provider, MD  metolazone (ZAROXOLYN) 2.5 MG tablet Take 1 tablet (2.5 mg total) by mouth as needed. 04/03/13  Yes Amy D Ninfa Meeker, NP  mupirocin ointment (BACTROBAN) 2 % Use qd with dressing change 09/12/12  Yes Evie Lacks Plotnikov, MD  omeprazole (PRILOSEC) 20 MG capsule Take 1 capsule (20 mg total) by mouth daily. 07/22/12  Yes Shaune Pascal Bensimhon, MD  potassium chloride SA (K-DUR,KLOR-CON) 20 MEQ tablet Take 1 tablet (20 mEq total) by mouth daily. 02/16/13  Yes Jolaine Artist, MD  torsemide (DEMADEX) 20 MG tablet Take 3 tablets (60 mg total) by mouth 2 (two) times daily. 04/03/13  Yes Amy Estrella Deeds, NP    Social History:  reports that he has quit smoking. He has never used smokeless tobacco. He reports that he does not drink alcohol or use illicit drugs.  Family History  Problem Relation Age of Onset  . Heart disease Father      All the positives are listed in BOLD  Review of Systems:  HEENT: Headache, blurred vision, runny nose, sore throat Neck:  Hypothyroidism, hyperthyroidism,,lymphadenopathy Chest : Shortness of breath, history of COPD, Asthma Heart : Chest pain, history of coronary arterey disease GI:  Nausea, vomiting, diarrhea, constipation, GERD GU: Dysuria, urgency, frequency of urination, hematuria Neuro: Stroke, seizures, syncope Psych: Depression, anxiety, hallucinations   Physical Exam: Blood pressure 148/68, pulse 84, temperature 98 F (36.7 C), temperature source Oral, resp. rate 18, SpO2 95.00%. Constitutional:   Patient is a well-developed and well-nourished male* in no acute distress and cooperative with exam. Head: Normocephalic and atraumatic Mouth: Mucus membranes moist Eyes: PERRL, EOMI, conjunctivae normal Neck: Supple, No Thyromegaly Cardiovascular: RRR, S1 normal, S2 normal Pulmonary/Chest: CTAB, no wheezes, rales, or rhonchi Abdominal: Soft. Non-tender, distended, bowel sounds are normal, no masses, organomegaly, or guarding present.  Neurological: A&O x3, Strenght is normal and symmetric bilaterally, cranial nerve II-XII are grossly intact, no focal motor deficit, sensory intact to light touch bilaterally.  Extremities : No Cyanosis, Clubbing or Edema   Labs on Admission:  Results for orders placed  during the hospital encounter of 04/20/13 (from the past 48 hour(s))  CBC WITH DIFFERENTIAL     Status: Abnormal   Collection Time    04/20/13 11:16 AM      Result Value Ref Range   WBC 12.4 (*) 4.0 - 10.5 K/uL   RBC 1.77 (*) 4.22 - 5.81 MIL/uL   Hemoglobin 4.8 (*) 13.0 - 17.0 g/dL   Comment: REPEATED TO VERIFY     CRITICAL RESULT CALLED TO, READ BACK BY AND VERIFIED WITH:     KHALID,A AT 1141 ON [id number] BY POTEAT,S   HCT 16.1 (*) 39.0 - 52.0 %   MCV 91.0  78.0 - 100.0 fL   MCH 27.1  26.0 - 34.0 pg   MCHC 29.8 (*) 30.0 - 36.0 g/dL   RDW 25.5 (*) 11.5 - 15.5 %   Platelets 276  150 - 400 K/uL   Neutrophils Relative % 86 (*) 43 - 77 %   Lymphocytes Relative 9 (*) 12 - 46 %   Monocytes Relative 3  3  - 12 %   Eosinophils Relative 2  0 - 5 %   Basophils Relative 0  0 - 1 %   Neutro Abs 10.7 (*) 1.7 - 7.7 K/uL   Lymphs Abs 1.1  0.7 - 4.0 K/uL   Monocytes Absolute 0.4  0.1 - 1.0 K/uL   Eosinophils Absolute 0.2  0.0 - 0.7 K/uL   Basophils Absolute 0.0  0.0 - 0.1 K/uL   RBC Morphology POLYCHROMASIA PRESENT     Comment: RARE NRBCs     TEARDROP CELLS     ELLIPTOCYTES     HOWELL/JOLLY BODIES   WBC Morphology MILD LEFT SHIFT (1-5% METAS, OCC MYELO, OCC BANDS)    COMPREHENSIVE METABOLIC PANEL     Status: Abnormal   Collection Time    04/20/13 11:16 AM      Result Value Ref Range   Sodium 138  137 - 147 mEq/L   Potassium 4.3  3.7 - 5.3 mEq/L   Chloride 103  96 - 112 mEq/L   CO2 22  19 - 32 mEq/L   Glucose, Bld 266 (*) 70 - 99 mg/dL   BUN 69 (*) 6 - 23 mg/dL   Creatinine, Ser 2.32 (*) 0.50 - 1.35 mg/dL   Calcium 9.1  8.4 - 10.5 mg/dL   Total Protein 6.1  6.0 - 8.3 g/dL   Albumin 2.9 (*) 3.5 - 5.2 g/dL   AST 15  0 - 37 U/L   ALT 13  0 - 53 U/L   Alkaline Phosphatase 60  39 - 117 U/L   Total Bilirubin <0.2 (*) 0.3 - 1.2 mg/dL   GFR calc non Af Amer 25 (*) >90 mL/min   GFR calc Af Amer 29 (*) >90 mL/min   Comment: (NOTE)     The eGFR has been calculated using the CKD EPI equation.     This calculation has not been validated in all clinical situations.     eGFR's persistently <90 mL/min signify possible Chronic Kidney     Disease.  PRO B NATRIURETIC PEPTIDE     Status: Abnormal   Collection Time    04/20/13 11:16 AM      Result Value Ref Range   Pro B Natriuretic peptide (BNP) 1139.0 (*) 0 - 450 pg/mL  Randolm Idol, ED     Status: None   Collection Time    04/20/13 11:22 AM      Result Value Ref  Range   Troponin i, poc 0.04  0.00 - 0.08 ng/mL   Comment 3            Comment: Due to the release kinetics of cTnI,     a negative result within the first hours     of the onset of symptoms does not rule out     myocardial infarction with certainty.     If myocardial infarction  is still suspected,     repeat the test at appropriate intervals.  POC OCCULT BLOOD, ED     Status: Abnormal   Collection Time    04/20/13 11:55 AM      Result Value Ref Range   Fecal Occult Bld POSITIVE (*) NEGATIVE  TYPE AND SCREEN     Status: None   Collection Time    04/20/13 12:00 PM      Result Value Ref Range   ABO/RH(D) O POS     Antibody Screen NEG     Sample Expiration 04/23/2013     Unit Number [id number]     Blood Component Type RED CELLS,LR     Unit division 00     Status of Unit ISSUED     Transfusion Status OK TO TRANSFUSE     Crossmatch Result Compatible     Unit Number [id number]     Blood Component Type RBC LR PHER1     Unit division 00     Status of Unit ALLOCATED     Transfusion Status OK TO TRANSFUSE     Crossmatch Result Compatible    PREPARE RBC (CROSSMATCH)     Status: None   Collection Time    04/20/13 12:00 PM      Result Value Ref Range   Order Confirmation ORDER PROCESSED BY BLOOD BANK    URINALYSIS, ROUTINE W REFLEX MICROSCOPIC     Status: None   Collection Time    04/20/13 12:45 PM      Result Value Ref Range   Color, Urine YELLOW  YELLOW   APPearance CLEAR  CLEAR   Specific Gravity, Urine 1.012  1.005 - 1.030   pH 5.0  5.0 - 8.0   Glucose, UA NEGATIVE  NEGATIVE mg/dL   Hgb urine dipstick NEGATIVE  NEGATIVE   Bilirubin Urine NEGATIVE  NEGATIVE   Ketones, ur NEGATIVE  NEGATIVE mg/dL   Protein, ur NEGATIVE  NEGATIVE mg/dL   Urobilinogen, UA 0.2  0.0 - 1.0 mg/dL   Nitrite NEGATIVE  NEGATIVE   Leukocytes, UA NEGATIVE  NEGATIVE   Comment: MICROSCOPIC NOT DONE ON URINES WITH NEGATIVE PROTEIN, BLOOD, LEUKOCYTES, NITRITE, OR GLUCOSE <1000 mg/dL.    Radiological Exams on Admission: Dg Chest 2 View  04/20/2013   CLINICAL DATA:  Shortness of breath, weakness, history of atrial fibrillation, history of prostate can't  EXAM: CHEST  2 VIEW  COMPARISON:  DG CHEST 2 VIEW dated 06/05/2012; CT ANGIO CHEST W/CM &/OR WO/CM dated 09/23/2010  FINDINGS:  Mild to moderate cardiac enlargement. Mild vascular congestion. Abnormal opacity over the right perihilar area extending laterally into the right middle lobe. There is a somewhat nodular component to the airspace opacity in the lateral right mid lung zone. There was density in this area previously, but the current opacity appears more pronounced.  IMPRESSION: Abnormal right hilar contour and middle lobe opacity. Adenopathy, mass, and pneumonia are all considerations. It is possible that this represents a progressive chronic fibrotic process. Consider evaluation contrast-enhanced CT.   Electronically Signed  By: Skipper Cliche M.D.   On: 04/20/2013 11:25    Assessment/Plan Active Problems:   Anemia   CHF   Diabetes Mellitus   Hypertension   Hypothyroidism   Anemia  Patient has hemoglobin 4.8, stool for occult blood is positive. GI [facility] has been consulted by the ED physician. Will keep the patient n.p.o. for possible endoscopy and colonoscopy as per GI. Will give 2 units of blood transfusion, check H&H every 6 hours. Patient has been given one dose of Lasix 60 g IV as he has a history of CHF to prevent fluid overload.  ? Community acquired pneumonia Patient's chest x-ray in the ED reveals abnormal right hilar contour and middle lobe opacity, we'll start Levaquin per pharmacy consultation. He may need the contrast enhanced CT, but avoiding at this time due to renal insufficiency.   GI bleed Patient has a history of hemorrhoids as well as gastritis. He had both endoscopy and colonoscopy 5 years ago. GI to see the patient for possible endoscopy and colonoscopy. We'll start IV Protonix twice a day  Congestive heart failure Patient has a history of grade 2 diastolic heart failure and is followed by heart failure team as outpatient. We'll continue Demadex. IV Lasix x1 has been given in the ED  Diabetes mellitus Continue with Levemir and initiate sliding scale insulin. Will hold the metformin at this  time  Hypertension Continue home regimen including hydralazine, Catapres patch.  History of CAD Continue Imdur, aspirin.  DVT prophylaxis SCDs    Code status:Patient is full code  Family discussion:Discussed with patient's son in law at bedside and daughter on the phone   Time Spent on Admission: 60 minutes  [facility] [facility] Pager: [phone] 04/20/2013, 3:02 PM  If 7PM-7AM, please contact night-coverage  [email or web address]  Password [email or web address]

## 2013-04-20 NOTE — ED Notes (Signed)
Pt. Is unable to use the restroom at this time, but is still aware that we need a specimen.

## 2013-04-20 NOTE — ED Notes (Signed)
Per ems pt c/o generalized leg weakness and productive cough white sputum x2 days. Pt was on ground when ems arrived. Pt reports he slipped out of his chair. Denies pain. No LOC, no altered mental. Pt does have slurred speech, but that is baseline.

## 2013-04-20 NOTE — Progress Notes (Signed)
Procedure note  Preoperative diagnosis: Gross hematuria History of prostate cancer status post radical prostatectomy Incontinence  Postoperative diagnosis: Bladder neck contracture Urethral laceration Urinary retention with overflow incontinence  Procedure: Flexible cystoscopy Foley catheter placement over a wire  Surgeon: Junious Silk  Description of procedure: After consent was obtained the patient was prepped and draped in the usual sterile fashion.  A flexible cystoscope was passed per urethra.  A laceration of the bulbous urethra was noted at the 5 o'clock position.  Anterior to this was the true lumen which I followed to the bladder neck.  It was initially covered up by a thin veil of scar tissue.  I popped the scope through the bladder neck into the bladder.  The bladder itself appeared normal but full of urine.  There was minimal clot in the bladder.  A sensor wire was advanced into the bladder.  Given that the scope dilated the bladder neck to gain access I knew a 16 [place] Foley might be difficult, but it would provide better drainage and likely not occlude compared to a 14 [place] Foley. Therefore the scope was backed out leaving the sensor wire and over the sensor wire a 16 [place] council tip catheter was advanced.  As expected it hung up at the bladder neck but with steady pressure it advanced.  I hubbed the catheter and it was draining clear urine.the balloon was inflated and then seated at the bladder neck.  The wire was removed and it was connected to gravity drainage.  Again it was draining clear urine which was light red at the end.  It initially drained 700 mL. Patient is on Levaquin.  I would recommend leaving the catheter for 7-10 days to allow the urethra to heal.

## 2013-04-21 ENCOUNTER — Encounter (HOSPITAL_COMMUNITY)
Admission: EM | Disposition: A | Discharge status: Skilled Nursing Facility | Payer: Self-pay | Source: Home / Self Care | Attending: Internal Medicine

## 2013-04-21 ENCOUNTER — Inpatient Hospital Stay (HOSPITAL_COMMUNITY): Payer: Medicare PPO | Admitting: Certified Registered Nurse Anesthetist

## 2013-04-21 ENCOUNTER — Encounter (HOSPITAL_COMMUNITY): Payer: Medicare PPO | Admitting: Certified Registered Nurse Anesthetist

## 2013-04-21 ENCOUNTER — Encounter (HOSPITAL_COMMUNITY): Payer: Self-pay | Admitting: Internal Medicine

## 2013-04-21 DIAGNOSIS — E119 Type 2 diabetes mellitus without complications: Secondary | ICD-10-CM

## 2013-04-21 DIAGNOSIS — R918 Other nonspecific abnormal finding of lung field: Secondary | ICD-10-CM

## 2013-04-21 DIAGNOSIS — N183 Chronic kidney disease, stage 3 unspecified: Secondary | ICD-10-CM

## 2013-04-21 DIAGNOSIS — I10 Essential (primary) hypertension: Secondary | ICD-10-CM

## 2013-04-21 DIAGNOSIS — Z86010 Personal history of colonic polyps: Secondary | ICD-10-CM

## 2013-04-21 DIAGNOSIS — D649 Anemia, unspecified: Secondary | ICD-10-CM

## 2013-04-21 DIAGNOSIS — R9389 Abnormal findings on diagnostic imaging of other specified body structures: Secondary | ICD-10-CM

## 2013-04-21 DIAGNOSIS — N189 Chronic kidney disease, unspecified: Secondary | ICD-10-CM

## 2013-04-21 DIAGNOSIS — J189 Pneumonia, unspecified organism: Secondary | ICD-10-CM

## 2013-04-21 DIAGNOSIS — I129 Hypertensive chronic kidney disease with stage 1 through stage 4 chronic kidney disease, or unspecified chronic kidney disease: Secondary | ICD-10-CM

## 2013-04-21 HISTORY — DX: Abnormal findings on diagnostic imaging of other specified body structures: R93.89

## 2013-04-21 HISTORY — DX: Hypertensive chronic kidney disease with stage 1 through stage 4 chronic kidney disease, or unspecified chronic kidney disease: I12.9

## 2013-04-21 HISTORY — DX: Anemia, unspecified: D64.9

## 2013-04-21 HISTORY — DX: Chronic kidney disease, stage 3 unspecified: N18.30

## 2013-04-21 HISTORY — PX: ESOPHAGOGASTRODUODENOSCOPY: SHX5428

## 2013-04-21 LAB — COMPREHENSIVE METABOLIC PANEL
ALT: 13 U/L (ref 0–53)
AST: 16 U/L (ref 0–37)
Albumin: 2.7 g/dL — ABNORMAL LOW (ref 3.5–5.2)
Alkaline Phosphatase: 55 U/L (ref 39–117)
BUN: 61 mg/dL — ABNORMAL HIGH (ref 6–23)
CALCIUM: 8.6 mg/dL (ref 8.4–10.5)
CO2: 24 mEq/L (ref 19–32)
Chloride: 103 mEq/L (ref 96–112)
Creatinine, Ser: 2.3 mg/dL — ABNORMAL HIGH (ref 0.50–1.35)
GFR calc non Af Amer: 26 mL/min — ABNORMAL LOW (ref >90)
GFR, EST AFRICAN AMERICAN: 30 mL/min — AB (ref >90)
Glucose, Bld: 130 mg/dL — ABNORMAL HIGH (ref 70–99)
Potassium: 4.2 mEq/L (ref 3.7–5.3)
SODIUM: 139 meq/L (ref 137–147)
TOTAL PROTEIN: 5.6 g/dL — AB (ref 6.0–8.3)
Total Bilirubin: 0.3 mg/dL (ref 0.3–1.2)

## 2013-04-21 LAB — GLUCOSE, CAPILLARY
GLUCOSE-CAPILLARY: 140 mg/dL — AB (ref 70–99)
Glucose-Capillary: 107 mg/dL — ABNORMAL HIGH (ref 70–99)
Glucose-Capillary: 137 mg/dL — ABNORMAL HIGH (ref 70–99)
Glucose-Capillary: 109 mg/dL — ABNORMAL HIGH (ref 70–99)

## 2013-04-21 LAB — HEMOGLOBIN AND HEMATOCRIT, BLOOD
HCT: 23.1 % — ABNORMAL LOW (ref 39.0–52.0)
HCT: 23 % — ABNORMAL LOW (ref 39.0–52.0)
HCT: 22.1 % — ABNORMAL LOW (ref 39.0–52.0)
HEMOGLOBIN: 7.4 g/dL — AB (ref 13.0–17.0)
Hemoglobin: 7.2 g/dL — ABNORMAL LOW (ref 13.0–17.0)
Hemoglobin: 7 g/dL — ABNORMAL LOW (ref 13.0–17.0)

## 2013-04-21 LAB — RETICULOCYTES
RBC.: 2.52 MIL/uL — ABNORMAL LOW (ref 4.22–5.81)
RETIC CT PCT: 12.8 % — AB (ref 0.4–3.1)
Retic Count, Absolute: 322.6 10*3/uL — ABNORMAL HIGH (ref 19.0–186.0)

## 2013-04-21 LAB — PREPARE RBC (CROSSMATCH)

## 2013-04-21 LAB — CBC
HCT: 20.4 % — ABNORMAL LOW (ref 39.0–52.0)
Hemoglobin: 6.3 g/dL — CL (ref 13.0–17.0)
MCH: 27.5 pg (ref 26.0–34.0)
MCHC: 30.9 g/dL (ref 30.0–36.0)
MCV: 89.1 fL (ref 78.0–100.0)
PLATELETS: 258 10*3/uL (ref 150–400)
RBC: 2.29 MIL/uL — ABNORMAL LOW (ref 4.22–5.81)
RDW: 21.1 % — AB (ref 11.5–15.5)
WBC: 15.6 10*3/uL — ABNORMAL HIGH (ref 4.0–10.5)

## 2013-04-21 LAB — DIRECT ANTIGLOBULIN TEST (NOT AT ARMC)
DAT, COMPLEMENT: NEGATIVE
DAT, IgG: NEGATIVE

## 2013-04-21 LAB — LACTATE DEHYDROGENASE: LDH: 184 U/L (ref 94–250)

## 2013-04-21 LAB — SAVE SMEAR

## 2013-04-21 LAB — MAGNESIUM: MAGNESIUM: 2.3 mg/dL (ref 1.5–2.5)

## 2013-04-21 SURGERY — EGD (ESOPHAGOGASTRODUODENOSCOPY)
Anesthesia: Monitor Anesthesia Care

## 2013-04-21 MED ORDER — ONDANSETRON HCL 4 MG/2ML IJ SOLN
INTRAMUSCULAR | Status: DC | PRN
  Administered 2013-04-21: 4 mg via INTRAVENOUS

## 2013-04-21 MED ORDER — SODIUM CHLORIDE 0.9 % IV SOLN: INTRAVENOUS | Status: DC

## 2013-04-21 MED ORDER — PROMETHAZINE HCL 25 MG/ML IJ SOLN: 6.2500 mg | INTRAMUSCULAR | Status: DC | PRN

## 2013-04-21 MED ORDER — LIDOCAINE HCL (CARDIAC) 20 MG/ML IV SOLN
INTRAVENOUS | Status: DC | PRN
  Administered 2013-04-21: 40 mg via INTRAVENOUS

## 2013-04-21 MED ORDER — PROPOFOL 10 MG/ML IV BOLUS
INTRAVENOUS | Status: AC
  Filled 2013-04-21: qty 20

## 2013-04-21 MED ORDER — PROPOFOL 10 MG/ML IV BOLUS
INTRAVENOUS | Status: DC | PRN
  Administered 2013-04-21 (×2): 20 mg via INTRAVENOUS

## 2013-04-21 MED ORDER — PROPOFOL INFUSION 10 MG/ML OPTIME
INTRAVENOUS | Status: DC | PRN
  Administered 2013-04-21: 50 ug/kg/min via INTRAVENOUS

## 2013-04-21 NOTE — Progress Notes (Signed)
Patient had a run of v tach.  Dr. Darrick Meigs notified, patient currently asymptomatic.  Durwin Nora RN

## 2013-04-21 NOTE — Transfer of Care (Signed)
Immediate Anesthesia Transfer of Care Note  Patient: [name]  Procedure(s) Performed: Procedure(s) (LRB): ESOPHAGOGASTRODUODENOSCOPY (EGD) (N/A)  Patient Location: PACU  Anesthesia Type: MAC  Level of Consciousness: sedated, patient cooperative and responds to stimulation  Airway & Oxygen Therapy: Patient Spontanous Breathing and Patient connected to face mask oxgen  Post-op Assessment: Report given to PACU RN and Post -op Vital signs reviewed and stable  Post vital signs: Reviewed and stable  Complications: No apparent anesthesia complications

## 2013-04-21 NOTE — Anesthesia Preprocedure Evaluation (Signed)
Anesthesia Evaluation  Patient identified by MRN, date of birth, ID band Patient awake    Reviewed: Allergy & Precautions, H&P , NPO status , Patient's Chart, lab work & pertinent test results  Airway Mallampati: II TM Distance: >3 FB Neck ROM: Full    Dental no notable dental hx. (+) Edentulous Upper, Edentulous Lower   Pulmonary sleep apnea , former smoker,  breath sounds clear to auscultation  Pulmonary exam normal       Cardiovascular hypertension, + CAD and +CHF + dysrhythmias (Mobitz type 1) Atrial Fibrillation Rhythm:Regular Rate:Normal     Neuro/Psych CVA, Residual Symptoms negative psych ROS   GI/Hepatic negative GI ROS, Neg liver ROS,   Endo/Other  diabetes, Type 2  Renal/GU negative Renal ROS  negative genitourinary   Musculoskeletal negative musculoskeletal ROS (+)   Abdominal   Peds negative pediatric ROS (+)  Hematology negative hematology ROS (+) anemia ,   Anesthesia Other Findings   Reproductive/Obstetrics negative OB ROS                           Anesthesia Physical Anesthesia Plan  ASA: III  Anesthesia Plan: MAC   Post-op Pain Management:    Induction:   Airway Management Planned:   Additional Equipment:   Intra-op Plan:   Post-operative Plan:   Informed Consent: I have reviewed the patients History and Physical, chart, labs and discussed the procedure including the risks, benefits and alternatives for the proposed anesthesia with the patient or authorized representative who has indicated his/her understanding and acceptance.   Dental advisory given  Plan Discussed with: CRNA  Anesthesia Plan Comments:         Anesthesia Quick Evaluation

## 2013-04-21 NOTE — Progress Notes (Signed)
Patient ID: LEMONT Kim, male   DOB: 1933-01-09, 80 y.o.   MRN: [id number] I saw [name] earlier today after he returned from endoscopy.  He had no specific complaints other than being tired.  Physical exam: Patient resting comfortably in bed.  No acute distress.  He was alert and oriented. Abdomen soft and nontender Foley was in place draining clear urine, no further bleeding around catheter.    Impression: Bladder neck contracture status post radical prostatectomy in 1995 Urethral laceration  Plan: Continue Foley 1 week. If patient should go home and he can see me in the office for voiding trial / Foley removal.

## 2013-04-21 NOTE — Progress Notes (Signed)
[facility] PROGRESS NOTE  LARICO DIMOCK JOA:[id number] DOB: December 06, 1932 DOA: 04/20/2013 PCP: [name], MD  Assessment/Plan:  Normocytic normochromic Anemia  Patient had hemoglobin 4.8, stool for occult blood is positive. GI LB was consulted, and patient underwent upper GI endoscopy which showed benign gastric polyps. 2 units of blood transfusion was given and today hemoglobin is 7.3. We'll get hematology consultation, he may require Procrit.  Hematuria Patient hematuria last night and urology was consulted. He underwent cystoscopy which showed tear in the urethra. Patient has a Foley catheter in place, which needs to stay there for a week. We'll follow the urology in one week for a voiding trial/Foley removal  ? Community acquired pneumonia  Patient's chest x-ray in the ED reveals abnormal right hilar contour and middle lobe opacity, started on Levaquin per pharmacy consultation. He may need the contrast enhanced CT, but avoiding at this time due to renal insufficiency.   GI bleed  Patient has a history of hemorrhoids as well as gastritis. He had both endoscopy and colonoscopy 5 years ago. GI performed EGD, no plans for colonoscopy. Recommendation for low-dose PPI 20 mg of daily  Congestive heart failure  Patient has a history of grade 2 diastolic heart failure and is followed by heart failure team as outpatient. We'll continue Demadex. I  Diabetes mellitus  Continue sliding scale insulin Will hold the metformin at this time   Hypertension  Continue home regimen including hydralazine, Catapres patch.   History of CAD  Continue Imdur, aspirin.   DVT prophylaxis  SCDs   Code Status: Full code Family Communication: Discussed with patient's daughter on the phone Disposition Plan: To be decided, physical therapy consulted   Consultants:  GI  Hematology  Procedures:  EGD- showed benign-appearing gastric polyps  Cystoscopy- urethral  tear  Antibiotics:  None  HPI/Subjective: Patient seen and examined, status post EGD. Denies abdominal pain.  Objective: Filed Vitals:   04/21/13 1421  BP: 115/45  Pulse: 84  Temp: 98.8 F (37.1 C)  Resp: 18    Intake/Output Summary (Last 24 hours) at 04/21/13 1554 Last data filed at 04/21/13 1427  Gross per 24 hour  Intake   1550 ml  Output   2475 ml  Net   -925 ml   Filed Weights   04/20/13 1652 04/21/13 1137  Weight: 109.5 kg (241 lb 6.5 oz) 109.317 kg (241 lb)    Exam:  Physical Exam: Head: Normocephalic, atraumatic.  Eyes: No signs of jaundice, EOMI Nose: Mucous membranes dry.  Throat: Oropharynx nonerythematous, no exudate appreciated.  Neck: supple,No deformities, masses, or tenderness noted. Lungs: Normal respiratory effort. B/L Clear to auscultation, no crackles or wheezes.  Heart: Regular RR. S1 and S2 normal  Abdomen: BS normoactive. Soft, Nondistended, non-tender.  Extremities: No pretibial edema, no erythema   Data Reviewed: Basic Metabolic Panel:  Recent Labs Lab 04/20/13 1116 04/21/13 0040  NA 138 139  K 4.3 4.2  CL 103 103  CO2 22 24  GLUCOSE 266* 130*  BUN 69* 61*  CREATININE 2.32* 2.30*  CALCIUM 9.1 8.6   Liver Function Tests:  Recent Labs Lab 04/20/13 1116 04/21/13 0040  AST 15 16  ALT 13 13  ALKPHOS 60 55  BILITOT <0.2* 0.3  PROT 6.1 5.6*  ALBUMIN 2.9* 2.7*   No results found for this basename: LIPASE, AMYLASE,  in the last 168 hours No results found for this basename: AMMONIA,  in the last 168 hours CBC:  Recent Labs Lab 04/20/13 1116  04/20/13 2034 04/21/13 0040 04/21/13 0736 04/21/13 1405  WBC 12.4* 14.6* 15.6*  --   --   NEUTROABS 10.7*  --   --   --   --   HGB 4.8* 5.7* 6.3* 7.4* 7.2*  HCT 16.1* 18.7* 20.4* 23.1* 23.0*  MCV 91.0 90.8 89.1  --   --   PLT 276 289 258  --   --    Cardiac Enzymes: No results found for this basename: CKTOTAL, CKMB, CKMBINDEX, TROPONINI,  in the last 168 hours BNP (last 3  results)  Recent Labs  04/03/13 1155 04/20/13 1116  PROBNP 893.8* 1139.0*   CBG:  Recent Labs Lab 04/20/13 1839 04/20/13 2149 04/21/13 0813 04/21/13 1135  GLUCAP 121* 135* 107* 137*    No results found for this or any previous visit (from the past 240 hour(s)).   Studies: Dg Chest 2 View  04/20/2013   CLINICAL DATA:  Shortness of breath, weakness, history of atrial fibrillation, history of prostate can't  EXAM: CHEST  2 VIEW  COMPARISON:  DG CHEST 2 VIEW dated 06/05/2012; CT ANGIO CHEST W/CM &/OR WO/CM dated 09/23/2010  FINDINGS: Mild to moderate cardiac enlargement. Mild vascular congestion. Abnormal opacity over the right perihilar area extending laterally into the right middle lobe. There is a somewhat nodular component to the airspace opacity in the lateral right mid lung zone. There was density in this area previously, but the current opacity appears more pronounced.  IMPRESSION: Abnormal right hilar contour and middle lobe opacity. Adenopathy, mass, and pneumonia are all considerations. It is possible that this represents a progressive chronic fibrotic process. Consider evaluation contrast-enhanced CT.   Electronically Signed   By: Skipper Cliche M.D.   On: 04/20/2013 11:25    Scheduled Meds: . amiodarone  100 mg Oral q morning - 10a  . amLODipine  10 mg Oral Daily  . aspirin  325 mg Oral Daily  . atorvastatin  40 mg Oral q1800  . citalopram  10 mg Oral BID  . [START ON 04/26/2013] cloNIDine  0.1 mg Transdermal Weekly  . hydrALAZINE  100 mg Oral 3 times per day  . insulin aspart  0-9 Units Subcutaneous TID WC  . insulin detemir  50 Units Subcutaneous BH-q7a  . isosorbide mononitrate  60 mg Oral Daily  . levofloxacin (LEVAQUIN) IV  750 mg Intravenous Q48H  . levothyroxine  125 mcg Oral QAC breakfast  .  morphine injection  2 mg Intravenous Once  . pantoprazole (PROTONIX) IV  40 mg Intravenous Q12H  . potassium chloride SA  20 mEq Oral Daily  . sodium chloride  3 mL  Intravenous Q12H  . torsemide  60 mg Oral BID   Continuous Infusions:   Principal Problem:   Anemia Active Problems:   HYPERTENSION   CHRONIC KIDNEY DISEASE STAGE III (MODERATE)   Acute on chronic diastolic heart failure    Time spent: 20 min   [facility] Pager [phone]. If 7PM-7AM, please contact night-coverage at [email or web address], password [facility] 04/21/2013, 3:54 PM  LOS: 1 day

## 2013-04-21 NOTE — Progress Notes (Signed)
CRITICAL VALUE ALERT  Critical value received:  hgb 6.3  Date of notification:  04/21/13  Time of notification:  0135  Critical value read back:yes  Nurse who received alert:  Kathyrn Lass  MD notified (1st page):  Tylene Fantasia  Time of first page: 0135  MD notified (2nd page):  Time of second page:  Responding MD: Tylene Fantasia  Time MD responded:  [phone]

## 2013-04-21 NOTE — Op Note (Signed)
[facility] [place], [address]   ENDOSCOPY PROCEDURE REPORT  PATIENT: [name], [name]  MR#: [id number] BIRTHDATE: 07/25/1932 , 49  yrs. old GENDER: Male ENDOSCOPIST: Eustace Quail, MD REFERRED BY:  [facility] PROCEDURE DATE:  04/21/2013 PROCEDURE:  EGD, diagnostic ASA CLASS:     Class III INDICATIONS:  Normocytic Anemia.   Heme positive stool. MEDICATIONS: MAC sedation, administered by CRNA and See Anesthesia Report. TOPICAL ANESTHETIC: Cetacaine Spray  DESCRIPTION OF PROCEDURE: After the risks benefits and alternatives of the procedure were thoroughly explained, informed consent was obtained.  The Howard [id number] endoscope was introduced through the mouth and advanced to the third portion of the duodenum. Without limitations.  The instrument was slowly withdrawn as the mucosa was fully examined.     EXAM:Esophagus was normal.  Stomach revealed multiple small inflammatory polyps in the antrum.  The stomach was otherwise normal.  The duodenal bulb and postbulbar duodenum were normal. Retroflexed views revealed no abnormalities.     The scope was then withdrawn from the patient and the procedure completed.  COMPLICATIONS: There were no complications. ENDOSCOPIC IMPRESSION: 1. Normal EGD except for small benign appearing gastric polyps. 2. Normocytic anemia. Multifactorial including renal insufficiency.  RECOMMENDATIONS: 1.Continue PPI, but low-dose such as 20 mg daily 2. Continue iron 3. Recommend hematology evaluation for chronic worsening multifactorial anemia. He may benefit from Procrit or other hematopoietic stimulant. 4. Discussed with daughter [name]. Will sign off.  REPEAT EXAM:  eSigned:  Eustace Quail, MD 04/21/2013 12:13 PM   CC:The Patient and Owens Loffler, MD

## 2013-04-21 NOTE — Preoperative (Addendum)
Beta Blockers   Reason not to administer Beta Blockers:Not Applicable 

## 2013-04-21 NOTE — Anesthesia Postprocedure Evaluation (Signed)
  Anesthesia Post-op Note  Patient: [name]  Procedure(s) Performed: Procedure(s) (LRB): ESOPHAGOGASTRODUODENOSCOPY (EGD) (N/A)  Patient Location: PACU  Anesthesia Type: MAC  Level of Consciousness: awake and alert   Airway and Oxygen Therapy: Patient Spontanous Breathing  Post-op Pain: mild  Post-op Assessment: Post-op Vital signs reviewed, Patient's Cardiovascular Status Stable, Respiratory Function Stable, Patent Airway and No signs of Nausea or vomiting  Last Vitals:  Filed Vitals:   04/21/13 1232  BP: 113/62  Pulse:   Temp:   Resp: 15    Post-op Vital Signs: stable   Complications: No apparent anesthesia complications

## 2013-04-21 NOTE — Progress Notes (Signed)
[facility] Progress Note  Subjective:  Patient says that he feels good today, but is tired.  Had incident with foley placement overnight; seen by urology.  Received two units or PRBC's.  Objective:  Vital signs in last 24 hours: Temp:  [98 F (36.7 C)-99.4 F (37.4 C)] 98.6 F (37 C) (03/03 0534) Pulse Rate:  [68-90] 88 (03/03 0534) Resp:  [14-24] 20 (03/03 0534) BP: (134-161)/(45-77) 161/77 mmHg (03/03 0534) SpO2:  [92 %-100 %] 100 % (03/03 0534) Weight:  [241 lb 6.5 oz (109.5 kg)] 241 lb 6.5 oz (109.5 kg) (03/02 1652) Last BM Date: 04/19/13 General:  Alert, elderly, in NAD. Heart:  Regular rate and rhythm. Pulm:  CTAB.  No W/R/R. Abdomen:  Soft and obese, but feels somewhat distended. Normal bowel sounds.  Non-tender.  Extremities:  1+ pitting edema in B/L LE's. Neurologic:  Alert and oriented x4;  grossly normal neurologically.  Somewhat difficult to understand his speech. Psych:  Alert and cooperative. Normal mood and affect.  Intake/Output from previous day: 03/02 0701 - 03/03 0700 In: 1250 [I.V.:250; Blood:450] Out: 2075 [Urine:2075]  Lab Results:  Recent Labs  04/20/13 1116 04/20/13 2034 04/21/13 0040 04/21/13 0736  WBC 12.4* 14.6* 15.6*  --   HGB 4.8* 5.7* 6.3* 7.4*  HCT 16.1* 18.7* 20.4* 23.1*  PLT 276 289 258  --    BMET  Recent Labs  04/20/13 1116 04/21/13 0040  NA 138 139  K 4.3 4.2  CL 103 103  CO2 22 24  GLUCOSE 266* 130*  BUN 69* 61*  CREATININE 2.32* 2.30*  CALCIUM 9.1 8.6   LFT  Recent Labs  04/21/13 0040  PROT 5.6*  ALBUMIN 2.7*  AST 16  ALT 13  ALKPHOS 55  BILITOT 0.3   PT/INR  Recent Labs  04/20/13 2034  LABPROT 13.6  INR 1.06   Dg Chest 2 View  04/20/2013   CLINICAL DATA:  Shortness of breath, weakness, history of atrial fibrillation, history of prostate can't  EXAM: CHEST  2 VIEW  COMPARISON:  DG CHEST 2 VIEW dated 06/05/2012; CT ANGIO CHEST W/CM &/OR WO/CM dated 09/23/2010  FINDINGS: Mild to moderate  cardiac enlargement. Mild vascular congestion. Abnormal opacity over the right perihilar area extending laterally into the right middle lobe. There is a somewhat nodular component to the airspace opacity in the lateral right mid lung zone. There was density in this area previously, but the current opacity appears more pronounced.  IMPRESSION: Abnormal right hilar contour and middle lobe opacity. Adenopathy, mass, and pneumonia are all considerations. It is possible that this represents a progressive chronic fibrotic process. Consider evaluation contrast-enhanced CT.   Electronically Signed   By: Skipper Cliche M.D.   On: 04/20/2013 11:25    Assessment / Plan: -Symptomatic anemia: Acute on chronic. Hgb 4.8 grams on admission; now 7.4 grams s/p 2 units PRBC's.  Heme positive stools. Rule out GIB; ? AVM's vs other etiology.  -CHF, atrial fibrillation, and CAD: Only anti-coagulant is ASA 325 mg daily.  -DM  -CKD  -PNA:  Being treated for PNA with Levaquin.  Not on O2. -Urethral laceration:  Occurred overnight with placement of Foley.  Seen by urology and cystoscopy performed.  -Monitor Hgb and transfuse further prn. -IV PPI BID.  -EGD this morning.  I tried to call his daughter, [name], on her cell three times to make her aware.  I do believe that patient is able to sign his own consent, however.  He understood the  procedure and repeated back to me what the procedure was and why it was being performed.    LOS: 1 day   ZEHR, JESSICA D.  04/21/2013, 8:56 AM  Pager number [phone]   GI ATTENDING  Interval history and laboratories reviewed. Agree with H&P as outlined above. Patient stable for upper endoscopy to rule out any significant cause for Hemoccult-positive stool and exacerbation of chronic anemia. I discussed this with his daughter by telephone. Witnessed by nurses. Discussed with patient.The nature of the procedure, as well as the risks, benefits, and alternatives were carefully and  thoroughly reviewed with the patient. Ample time for discussion and questions allowed. The patient understood, was satisfied, and agreed to proceed.  Docia Chuck. Geri Seminole., M.D. [facility]

## 2013-04-21 NOTE — Consult Note (Signed)
Referring MD: Dr. Darrick Meigs  PCP:  [name], MD   Reason for Referral: Evaluation of normochromic anemia   Chief Complaint  Patient presents with  . Weakness    HPI:  Pleasant 80 year old man with hypertension, insulin-dependent diabetes, chronic congestive heart failure with no prior history of MI, paroxysmal atrial fibrillation, chronic renal insufficiency with currently recorded creatinine of 2.3 . He presented to the emergency department on 04/20/2013 complaining of generalized weakness and dyspnea. He was found to be profoundly anemic with a hemoglobin of 4.8 and hematocrit 16%. MCV normal at 91. Initial white count 12,400 with 86% neutrophils and platelet count 276,000. Most recent comparison value available is from 01/07/2012 1 hemoglobin was 9.7. A hemoglobin of 8.7 recorded in November of 2012. He had a guaiac positive stool on admission so gastroenterology was called to consult. Upper endoscopy done on March 3 was normal except for a few benign appearing gastric polyps. A colonoscopy done in March 2010 and was unremarkable except for nonbleeding hemorrhoids. Upper endoscopy at that time was positive for H. pylori gastritis which was treated. The only other data in the [facility] system pertaining to anemia evaluation are vitamin and iron studies done in August of 2012. V03 and folic acid were normal. Iron levels were low at that time but ferritin was 71. There is a record of blood transfusions given in May of 2012. He also received blood during this admission.  He has had no hematochezia, melena, hematuria, hematemesis. Bilirubin 0.3, LDH 184.      Past Medical History  Diagnosis Date  . Chronic diastolic heart failure     secondary to diastolic dysfunction EF (previous EF 35-45%)ef 60%4/09  . Arrhythmia     atrial fibrillation /pt on amiodarone,thought to be poor coumadin  . Stroke   . Other and unspecified hyperlipidemia   . Hypertension   . Hypertrophy of prostate without urinary  obstruction and other lower urinary tract symptoms (LUTS)   . Osteoarthrosis, unspecified whether generalized or localized, unspecified site   . Type II or unspecified type diabetes mellitus without mention of complication, not stated as uncontrolled   . AV block, Mobitz 1     Intolerance to ACE's / discontiuation of beta blockers  . Obstructive sleep apnea   . Iron deficiency anemia, unspecified     s/p EGD and coloscopy 3/10.gastritis.hemorrhids  . Cerebrovascular accident   . Renal insufficiency   . Obesity   . Allergic rhinitis, cause unspecified 05/29/2010  . Prostate cancer   . CAD (coronary artery disease)     cath 12/04: mLAD 50-60%, mCFX 20-30%, pRCA 50-60%, mRCA 40%  :  Past Surgical History  Procedure Laterality Date  . Prostate surgery    . Prostate surgury      hx of  :  . amiodarone  100 mg Oral q morning - 10a  . amLODipine  10 mg Oral Daily  . aspirin  325 mg Oral Daily  . atorvastatin  40 mg Oral q1800  . citalopram  10 mg Oral BID  . [START ON 04/26/2013] cloNIDine  0.1 mg Transdermal Weekly  . hydrALAZINE  100 mg Oral 3 times per day  . insulin aspart  0-9 Units Subcutaneous TID WC  . insulin detemir  50 Units Subcutaneous BH-q7a  . isosorbide mononitrate  60 mg Oral Daily  . levofloxacin (LEVAQUIN) IV  750 mg Intravenous Q48H  . levothyroxine  125 mcg Oral QAC breakfast  .  morphine injection  2 mg Intravenous  Once  . pantoprazole (PROTONIX) IV  40 mg Intravenous Q12H  . potassium chloride SA  20 mEq Oral Daily  . sodium chloride  3 mL Intravenous Q12H  . torsemide  60 mg Oral BID  :  Allergies  Allergen Reactions  . Ace Inhibitors Other (See Comments)    me  hyposion  . Beta Adrenergic Blockers Other (See Comments)     use cautiously secondary to 2nd degree heart block, hypotension  :  Family History  Problem Relation Age of Onset  . Heart disease Father   :  History   Social History  . Marital Status: Divorced    Spouse Name: N/A     Number of Children: N/A  . Years of Education: N/A   Occupational History  .  he is a retired Brewing technologist    Social History Main Topics  . Smoking status: Former Research scientist (life sciences)  . Smokeless tobacco: Never Used  . Alcohol Use:  previous heavy alcohol use      Comment: history of abuse  . Drug Use: No  . Sexual Activity: No   Other Topics Concern  . Not on file   Social History Narrative   Regular exercise - no ambulates w/cane or walker  :  ROS: Eyes: No change in vision Throat: No sore throat Neck: Resp: Intermittent cough. No dyspnea  Cardio: No active chest pain, chest pressure, or palpitations GI: No change in bowel habit Extremities: No extremity edema Lymph nodes:  Neurologic: No headache or change in vision.  Skin: . No rash or ecchymosis Genitourinary: No urinary tract symptoms   Vitals: Filed Vitals:   04/21/13 1706  BP: 130/50  Pulse: 88  Temp:   Resp:     PHYSICAL EXAM: General appearance: Pleasant, obese, African American man HEENT: Arcus senilis. Pharynx no erythema or exudate  Lymph Nodes: No cervical, supraclavicular, or axillary adenopathy Resp: Clear to auscultation resonant to percussion Cardio: Regular rhythm no murmur gallop or rub Vascular: No carotid bruits, no cyanosis Breasts: GI: Abdomen soft, nontender, no mass, no organomegaly GU: Extremities: No edema, no calf tenderness Neurologic: He is alert and oriented. Pupils are pinpoint. Cranial nerves grossly normal. Motor strength 5 over 5. Reflexes 1+ symmetric. Upper body coordination normal. Some past pointing on finger to finger exam left upper extremity. Skin: Some areas of superficial breakdown of the skin on his legs. No ecchymoses.  Labs:   Recent Labs  04/20/13 2034 04/21/13 0040 04/21/13 0736 04/21/13 1405  WBC 14.6* 15.6*  --   --   HGB 5.7* 6.3* 7.4* 7.2*  HCT 18.7* 20.4* 23.1* 23.0*  PLT 289 258  --   --     Recent Labs  04/20/13 1116 04/21/13 0040  NA 138 139  K  4.3 4.2  CL 103 103  CO2 22 24  GLUCOSE 266* 130*  BUN 69* 61*  CREATININE 2.32* 2.30*  CALCIUM 9.1 8.6    Blood smear review: Pending  Images Studies/Results:  Dg Chest 2 View  04/20/2013   CLINICAL DATA:  Shortness of breath, weakness, history of atrial fibrillation, history of prostate can't  EXAM: CHEST  2 VIEW  COMPARISON:  DG CHEST 2 VIEW dated 06/05/2012; CT ANGIO CHEST W/CM &/OR WO/CM dated 09/23/2010  FINDINGS: Mild to moderate cardiac enlargement. Mild vascular congestion. Abnormal opacity over the right perihilar area extending laterally into the right middle lobe. There is a somewhat nodular component to the airspace opacity in the lateral right mid lung zone.  There was density in this area previously, but the current opacity appears more pronounced.  IMPRESSION: Abnormal right hilar contour and middle lobe opacity. Adenopathy, mass, and pneumonia are all considerations. It is possible that this represents a progressive chronic fibrotic process. Consider evaluation contrast-enhanced CT.   Electronically Signed   By: Skipper Cliche M.D.   On: 04/20/2013 11:25     Impression: #1. Chronic normochromic anemia likely related to chronic renal insufficiency which is in turn related to chronic hypertension and diabetes. Multiple myeloma is a consideration. While he is in the hospital, I will screen him for this. I will check a baseline erythropoietin level. He would likely benefit by erythropoietin stimulating agents such as Aranesp.  #2. Abnormal chest x-ray He needs a noncontrast CT scan of the chest for further evaluation.   Recommendation: 24 hour urine collection for total protein, immunofixation electrophoresis, and creatinine clearance. Check baseline erythropoietin level Noncontrast CT chest  Further recommendations based on results of above. Anticipate I will recommend a trial of Aranesp 300 mcg subcutaneous every [redacted] weeks along with oral iron supplementation.  Thank you  for this consultation   Dream Nodal M 04/21/2013, 5:11 PM

## 2013-04-22 ENCOUNTER — Inpatient Hospital Stay (HOSPITAL_COMMUNITY): Payer: Medicare PPO

## 2013-04-22 ENCOUNTER — Encounter (HOSPITAL_COMMUNITY): Payer: Self-pay | Admitting: Internal Medicine

## 2013-04-22 DIAGNOSIS — R05 Cough: Secondary | ICD-10-CM

## 2013-04-22 DIAGNOSIS — R059 Cough, unspecified: Secondary | ICD-10-CM

## 2013-04-22 LAB — GLUCOSE, CAPILLARY
GLUCOSE-CAPILLARY: 215 mg/dL — AB (ref 70–99)
Glucose-Capillary: 228 mg/dL — ABNORMAL HIGH (ref 70–99)
Glucose-Capillary: 241 mg/dL — ABNORMAL HIGH (ref 70–99)

## 2013-04-22 LAB — FOLATE: Folate: >20 ng/mL

## 2013-04-22 LAB — HEMOGLOBIN AND HEMATOCRIT, BLOOD
HCT: 21.1 % — ABNORMAL LOW (ref 39.0–52.0)
HEMATOCRIT: 25.2 % — AB (ref 39.0–52.0)
HEMOGLOBIN: 6.5 g/dL — AB (ref 13.0–17.0)
Hemoglobin: 7.9 g/dL — ABNORMAL LOW (ref 13.0–17.0)

## 2013-04-22 LAB — IRON AND TIBC
IRON: 22 ug/dL — AB (ref 42–135)
SATURATION RATIOS: 7 % — AB (ref 20–55)
TIBC: 322 ug/dL (ref 215–435)
UIBC: 300 ug/dL (ref 125–400)

## 2013-04-22 LAB — VITAMIN B12: Vitamin B-12: 823 pg/mL (ref 211–911)

## 2013-04-22 LAB — FERRITIN: Ferritin: 33 ng/mL (ref 22–322)

## 2013-04-22 LAB — ERYTHROPOIETIN: Erythropoietin: 779.3 m[IU]/mL — ABNORMAL HIGH (ref 2.6–18.5)

## 2013-04-22 LAB — PREPARE RBC (CROSSMATCH)

## 2013-04-22 MED ORDER — FUROSEMIDE 10 MG/ML IJ SOLN
20.0000 mg | Freq: Once | INTRAMUSCULAR | Status: AC
  Administered 2013-04-22: 20 mg via INTRAVENOUS
  Filled 2013-04-22: qty 2

## 2013-04-22 MED ORDER — PANTOPRAZOLE SODIUM 40 MG PO TBEC
40.0000 mg | DELAYED_RELEASE_TABLET | Freq: Two times a day (BID) | ORAL | Status: DC
  Administered 2013-04-22 – 2013-04-28 (×12): 40 mg via ORAL
  Filled 2013-04-22 (×13): qty 1

## 2013-04-22 NOTE — Progress Notes (Signed)
Pharmacist Heart Failure Core Measure Documentation  Assessment: [name] has an EF documented as 55-60% on 11/13 by ECHO.  Rationale: Heart failure patients with left ventricular systolic dysfunction (LVSD) and an EF < 40% should be prescribed an angiotensin converting enzyme inhibitor (ACEI) or angiotensin receptor blocker (ARB) at discharge unless a contraindication is documented in the medical record.  This patient is not currently on an ACEI or ARB for HF.  This note is being placed in the record in order to provide documentation that a contraindication to the use of these agents is present for this encounter.  ACE Inhibitor or Angiotensin Receptor Blocker is contraindicated (specify all that apply)  [x]   ACEI allergy  []   Angioedema []   Moderate or severe aortic stenosis []   Hyperkalemia []   Hypotension []   Renal artery stenosis [x]   Worsening renal function, preexisting renal disease or dysfunction   Kara Mead 04/22/2013 7:39 AM

## 2013-04-22 NOTE — Progress Notes (Signed)
Clinical Social Work Department BRIEF PSYCHOSOCIAL ASSESSMENT 04/22/2013  Patient:  [name], [name]     Account Number:  [id number]     Admit date:  04/20/2013  Clinical Social Worker:  Earlie Server  Date/Time:  04/22/2013 04:15 PM  Referred by:  Physician  Date Referred:  04/22/2013 Referred for  SNF Placement   Other Referral:   Interview type:  Patient Other interview type:    PSYCHOSOCIAL DATA Living Status:  ALONE Admitted from facility:   Level of care:   Primary support name:  [name] Primary support relationship to patient:  CHILD, ADULT Degree of support available:   Adequate    CURRENT CONCERNS Current Concerns  Post-Acute Placement   Other Concerns:    SOCIAL WORK ASSESSMENT / PLAN CSW received referral in order to assist with DC planning. Per chart review, PT recommends SNF vs 24 hour supervision. CSW met with patient at bedside. CSW introduced myself and explained role.    Patient reports that he lives at home alone. Patient states he has a Therapist, sports from [place] that checks on him about every other day. Patient reports that he has all the equipment he needs such as wheelchair, walker, and BSC. Patient states that family is very involved and he has a dtr that lives nearby and visits often.    CSW discussed PT recommendations for SNF placement. Patient reports that he does feel weak but reports that he does well at home. Patient refuses SNF placement and reports he just wants to get better to DC home. CSW explained SNF in further detail and explained process. Patient reports he will talk to his dtr to see if he can stay with her for a few weeks instead of going to SNF.    CSW will continue to follow and will assist as needed. CSW has completed FL2 and pasarr in case placement is needed but needs patient permission prior to complete SNF search.   Assessment/plan status:  Psychosocial Support/Ongoing Assessment of Needs Other assessment/ plan:    Information/referral to community resources:   SNF list    PATIENT'S/FAMILY'S RESPONSE TO PLAN OF CARE: Patient alert and oriented. Patient engaged in assessment but guarded when discussing SNF placement. Patient feels that family is supportive enough and feels that he is a strong individual that can care for himself. Patient feels that family is very supportive and can assist as needed. Patient does not feel he needs SNF placement. Patient agreeable to talk with family and get their opinions and to determine if he can stay with them. Patient agreeable for CSW to follow up.       [place], [place] [phone]

## 2013-04-22 NOTE — Progress Notes (Addendum)
LDH, bilirubin normal.  COOMB's test negative.  Retic count of 12% surprising! Blood film reviewed.  He has numerous spherocytes, polychromasia. 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  Some element of iron depletion but ferritin low normal - would not explain severe anemia. Retic count low in iron deficiency.  I will order RBC enzyme tests G6PD & Pyruvate kinase Stop any potential oxidzing meds No steroids for now Transfuse prn  Discussed w hospital attending

## 2013-04-22 NOTE — Progress Notes (Signed)
Lab called with Hgb. 6.7, MD notified. Will continue with current plan of care.

## 2013-04-22 NOTE — Care Management Note (Unsigned)
    Page 1 of 1   04/22/2013     2:11:06 PM   CARE MANAGEMENT NOTE 04/22/2013  Patient:  [name], [name]   Account Number:  [id number]  Date Initiated:  04/22/2013  Documentation initiated by:  [facility]  Subjective/Objective Assessment:   80 year old male with hx prostate cancer admitted with shortness of breath, ? CAP and CHF.     Action/Plan:   Fielding services via [place].   Anticipated DC Date:  04/25/2013   Anticipated DC Plan:  [place]  CM consult      Choice offered to / List presented to:             Status of service:  In process, will continue to follow Medicare Important Message given?  NA - LOS <3 / Initial given by admissions (If response is "NO", the following Medicare IM given date fields will be blank) Date Medicare IM given:   Date Additional Medicare IM given:    Discharge Disposition:    Per UR Regulation:  Reviewed for med. necessity/level of care/duration of stay  If discussed at [place] of Stay Meetings, dates discussed:    Comments:

## 2013-04-22 NOTE — Evaluation (Signed)
Physical Therapy Evaluation Patient Details Name: [name] MRN: [id number] DOB: [birth year] Today's Date: 04/22/2013 Time: 7782-4235 PT Time Calculation (min): 16 min  PT Assessment / Plan / Recommendation History of Present Illness  80 yo male admitted with anemia, fall. Hx of HTN, DM, CHF, Afib, CVA. Pt lives alone.  Clinical Impression  On eval, pt required Min assist for mobility-able to ambulate ~75 feet with walker. Demonstrates general weakness, decreased activity tolerance, and impaired gait and balance. Recommend SNF unless pt will have assistance at home. Familiar with pt from previous admissions and he will likely refuse placement. If so, recommend HHPT,24/7 supervision. Pt states he may d/c to daughter's home.     PT Assessment  Patient needs continued PT services    Follow Up Recommendations  SNF;Supervision/Assistance - 24 hour (unless pt will have assistance at home-Pt will likely refuse placement.     Does the patient have the potential to tolerate intense rehabilitation      Barriers to Discharge        Equipment Recommendations  None recommended by PT    Recommendations for Other Services OT consult   Frequency Min 3X/week    Precautions / Restrictions Precautions Precautions: Fall Restrictions Weight Bearing Restrictions: No   Pertinent Vitals/Pain No c/o pain but pt does endorse bil foot numbness- ?? History of neuropathy      Mobility  Bed Mobility Overal bed mobility: Needs Assistance Bed Mobility: Supine to Sit Supine to sit: Min assist;HOB elevated General bed mobility comments: Increased time. Assist for trunk to upright. Pt states he normally sleeps in a recliner Transfers Overall transfer level: Needs assistance Transfers: Sit to/from Stand Sit to Stand: From elevated surface;Min assist General transfer comment: Assist to rise, stabilize, control descent. VCs safety, hand placement Ambulation/Gait Ambulation/Gait assistance: Min  assist Ambulation Distance (Feet): 75 Feet Assistive device: Rolling walker (2 wheeled) Gait Pattern/deviations: Decreased stride length;Trunk flexed;Wide base of support General Gait Details: slow gait speed. Pt repeatedly bumped feet into walker legs. Assist to stabilize throughout ambulation. Fatigues fairly easily.     Exercises     PT Diagnosis: Difficulty walking;Generalized weakness  PT Problem List: Decreased strength;Decreased activity tolerance;Decreased balance;Decreased mobility;Decreased knowledge of use of DME PT Treatment Interventions: DME instruction;Gait training;Functional mobility training;Therapeutic activities;Therapeutic exercise;Patient/family education;Balance training     PT Goals(Current goals can be found in the care plan section) Acute Rehab PT Goals Patient Stated Goal: to get better and d/c to daughter's home PT Goal Formulation: With patient Time For Goal Achievement: 05/06/13 Potential to Achieve Goals: Good  Visit Information  Last PT Received On: 04/22/13 Assistance Needed: +1 History of Present Illness: 80 yo male admitted with anemia, fall. Hx of HTN, DM, CHF, Afib, CVA. Pt lives alone.       Prior [place] expects to be discharged to:: Unsure (info is for pt's home-however pt states he may stay with one of his daughters initially) Living Arrangements: Alone Type of Home: Apartment Home Access: Ramped entrance Home Layout: One level Home Equipment: Wheelchair - power;Walker - 4 wheels;Cane - quad;Cane - single point;Hand held shower head;Tub bench Additional Comments: uses wheelchair inside apt; walker in community Prior Function Level of Independence: Independent with assistive device(s) Communication Communication: No difficulties (pt mumbles) Dominant Hand: Right    Cognition  Cognition Arousal/Alertness: Awake/alert Behavior During Therapy: WFL for tasks assessed/performed Overall Cognitive Status:  Within Functional Limits for tasks assessed    Extremity/Trunk Assessment Upper Extremity Assessment Upper Extremity  Assessment: Generalized weakness Lower Extremity Assessment Lower Extremity Assessment: Generalized weakness;RLE deficits/detail;LLE deficits/detail RLE Deficits / Details: pt c/o foot numbness LLE Deficits / Details: pt c/o foot numbness Cervical / Trunk Assessment Cervical / Trunk Assessment: Normal   Balance Balance Overall balance assessment: History of Falls;Needs assistance Sitting-balance support: Bilateral upper extremity supported;Feet supported Sitting balance-Leahy Scale: Good Standing balance support: Bilateral upper extremity supported;During functional activity Standing balance-Leahy Scale: Fair  End of Session PT - End of Session Activity Tolerance: Patient limited by fatigue Patient left: in chair;with call bell/phone within reach Nurse Communication: Mobility status  GP     Weston Anna, MPT Pager: [phone]

## 2013-04-22 NOTE — Progress Notes (Signed)
[facility] PROGRESS NOTE  [name] PFX:[id number] DOB: 03-05-1932 DOA: 04/20/2013 PCP: [name], MD  Assessment/Plan: 80 year old man withHTN, CAD, CHF, IDDM, CKD, PAF, CKD with currently recorded creatinine of 2.3 presented to the emergency department on 04/20/2013 complaining of generalized weakness and dyspnea  found to be profoundly anemic with a hemoglobin of 4.8  1. Anemia IDA+likely worse with blood loss; + hem occults+hematuria   -s/p upper GI endoscopy which showed benign gastric polyps. -2 units of blood transfusion on admission; will TF 2 units 3/4; monitor  -r/o MM pend test results   2. Hematuria hematuria last night and urology was consulted.  -s/p cystoscopy which showed tear in the urethra. Patient has a Foley catheter in place, which needs to stay there for a week.  -follow the urology in one week for a voiding trial/Foley removal   3. Community acquired pneumonia; chest x-ray right hilar contour and middle lobe opacity - Levaquin per pharmacy consultation. He may need the contrast enhanced CT, but avoiding at this time due to renal insufficiency.  -obtain no contrast CT chest   4. GI bleed history of hemorrhoids as well as gastritis. He had both endoscopy and colonoscopy 5 years ago. -GI performed EGD, no plans for colonoscopy. Recommendation for low-dose PPI 20 mg of daily   5. Congestive heart failure echo (2013): LVEF 32%, grade 2 diastolic dysfunction -continue Demadex  6. Diabetes mellitus; HA1C-7.4 (07/2012) -Continue sliding scale insulin; d/c metformin not candidate due to CKD  7. Hypertension  Continue home regimen including hydralazine, Catapres patch.   8. History of CAD Continue Imdur, aspirin.   9. PAF not on AC due to GIB, bleeding, fall risk; cont amiodarone; not on  BB;   DVT prophylaxis  SCDs  D/w patient, he is DNR; confirmed with her daughter   Code Status: DNR  Family Communication: d/w patient, [name] Daughter  [phone]  (indicate person spoken with, relationship, and if by phone, the number) Disposition Plan: pend PT    Consultants:  Hem onc,  Consultants:  GI  Hematology Procedures:  EGD- showed benign-appearing gastric polyps  Cystoscopy- urethral tear   Antibiotics:  Levofloxacin 3/3<<<< (indicate start date, and stop date if known)  HPI/Subjective: alert  Objective: Filed Vitals:   04/22/13 0550  BP: 145/61  Pulse: 89  Temp: 98.8 F (37.1 C)  Resp: 20    Intake/Output Summary (Last 24 hours) at 04/22/13 0904 Last data filed at 04/22/13 0831  Gross per 24 hour  Intake    780 ml  Output   2475 ml  Net  -1695 ml   Filed Weights   04/20/13 1652 04/21/13 1137  Weight: 109.5 kg (241 lb 6.5 oz) 109.317 kg (241 lb)    Exam:   General:  alert  Cardiovascular: s1,s2 rrr  Respiratory: few crackles in LL  Abdomen: soft, distended, nt  Musculoskeletal: no edema   Data Reviewed: Basic Metabolic Panel:  Recent Labs Lab 04/20/13 1116 04/21/13 0040 04/21/13 2255  NA 138 139  --   K 4.3 4.2  --   CL 103 103  --   CO2 22 24  --   GLUCOSE 266* 130*  --   BUN 69* 61*  --   CREATININE 2.32* 2.30*  --   CALCIUM 9.1 8.6  --   MG  --   --  2.3   Liver Function Tests:  Recent Labs Lab 04/20/13 1116 04/21/13 0040  AST 15 16  ALT 13  13  ALKPHOS 60 55  BILITOT <0.2* 0.3  PROT 6.1 5.6*  ALBUMIN 2.9* 2.7*   No results found for this basename: LIPASE, AMYLASE,  in the last 168 hours No results found for this basename: AMMONIA,  in the last 168 hours CBC:  Recent Labs Lab 04/20/13 1116 04/20/13 2034 04/21/13 0040 04/21/13 0736 04/21/13 1405 04/21/13 2255 04/22/13 0820  WBC 12.4* 14.6* 15.6*  --   --   --   --   NEUTROABS 10.7*  --   --   --   --   --   --   HGB 4.8* 5.7* 6.3* 7.4* 7.2* 7.0* 6.5*  HCT 16.1* 18.7* 20.4* 23.1* 23.0* 22.1* 21.1*  MCV 91.0 90.8 89.1  --   --   --   --   PLT 276 289 258  --   --   --   --     Cardiac Enzymes: No results found for this basename: CKTOTAL, CKMB, CKMBINDEX, TROPONINI,  in the last 168 hours BNP (last 3 results)  Recent Labs  04/03/13 1155 04/20/13 1116  PROBNP 893.8* 1139.0*   CBG:  Recent Labs Lab 04/21/13 0813 04/21/13 1135 04/21/13 1641 04/21/13 2205 04/22/13 0828  GLUCAP 107* 137* 140* 109* 228*    No results found for this or any previous visit (from the past 240 hour(s)).   Studies: Dg Chest 2 View  04/20/2013   CLINICAL DATA:  Shortness of breath, weakness, history of atrial fibrillation, history of prostate can't  EXAM: CHEST  2 VIEW  COMPARISON:  DG CHEST 2 VIEW dated 06/05/2012; CT ANGIO CHEST W/CM &/OR WO/CM dated 09/23/2010  FINDINGS: Mild to moderate cardiac enlargement. Mild vascular congestion. Abnormal opacity over the right perihilar area extending laterally into the right middle lobe. There is a somewhat nodular component to the airspace opacity in the lateral right mid lung zone. There was density in this area previously, but the current opacity appears more pronounced.  IMPRESSION: Abnormal right hilar contour and middle lobe opacity. Adenopathy, mass, and pneumonia are all considerations. It is possible that this represents a progressive chronic fibrotic process. Consider evaluation contrast-enhanced CT.   Electronically Signed   By: Skipper Cliche M.D.   On: 04/20/2013 11:25    Scheduled Meds: . amiodarone  100 mg Oral q morning - 10a  . amLODipine  10 mg Oral Daily  . aspirin  325 mg Oral Daily  . atorvastatin  40 mg Oral q1800  . citalopram  10 mg Oral BID  . [START ON 04/26/2013] cloNIDine  0.1 mg Transdermal Weekly  . hydrALAZINE  100 mg Oral 3 times per day  . insulin aspart  0-9 Units Subcutaneous TID WC  . insulin detemir  50 Units Subcutaneous BH-q7a  . isosorbide mononitrate  60 mg Oral Daily  . levofloxacin (LEVAQUIN) IV  750 mg Intravenous Q48H  . levothyroxine  125 mcg Oral QAC breakfast  .  morphine injection  2 mg  Intravenous Once  . pantoprazole (PROTONIX) IV  40 mg Intravenous Q12H  . potassium chloride SA  20 mEq Oral Daily  . sodium chloride  3 mL Intravenous Q12H  . torsemide  60 mg Oral BID   Continuous Infusions:   Principal Problem:   Anemia Active Problems:   HYPERTENSION   CHRONIC KIDNEY DISEASE STAGE III (MODERATE)   Acute on chronic diastolic heart failure   Normochromic normocytic anemia   Abnormal CXR   Hypertensive kidney disease with CKD stage III  Time spent: >35 minutes     Kinnie Feil  [facility] Pager [phone]. If 7PM-7AM, please contact night-coverage at [email or web address], password [facility] 04/22/2013, 9:04 AM  LOS: 2 days

## 2013-04-23 DIAGNOSIS — Z8546 Personal history of malignant neoplasm of prostate: Secondary | ICD-10-CM

## 2013-04-23 DIAGNOSIS — M949 Disorder of cartilage, unspecified: Secondary | ICD-10-CM

## 2013-04-23 DIAGNOSIS — D638 Anemia in other chronic diseases classified elsewhere: Secondary | ICD-10-CM

## 2013-04-23 DIAGNOSIS — I4891 Unspecified atrial fibrillation: Secondary | ICD-10-CM

## 2013-04-23 DIAGNOSIS — M899 Disorder of bone, unspecified: Secondary | ICD-10-CM

## 2013-04-23 DIAGNOSIS — I129 Hypertensive chronic kidney disease with stage 1 through stage 4 chronic kidney disease, or unspecified chronic kidney disease: Secondary | ICD-10-CM

## 2013-04-23 DIAGNOSIS — R937 Abnormal findings on diagnostic imaging of other parts of musculoskeletal system: Secondary | ICD-10-CM

## 2013-04-23 DIAGNOSIS — I5033 Acute on chronic diastolic (congestive) heart failure: Secondary | ICD-10-CM

## 2013-04-23 LAB — TYPE AND SCREEN
ABO/RH(D): O POS
Antibody Screen: NEGATIVE
UNIT DIVISION: 0
UNIT DIVISION: 0
Unit division: 0
Unit division: 0
Unit division: 0

## 2013-04-23 LAB — MULTIPLE MYELOMA PANEL, SERUM
ALPHA-1-GLOBULIN: 8.9 % — AB (ref 2.9–4.9)
Albumin ELP: 52.7 % — ABNORMAL LOW (ref 55.8–66.1)
Alpha-2-Globulin: 11.7 % (ref 7.1–11.8)
Beta 2: 5.3 % (ref 3.2–6.5)
Beta Globulin: 8.7 % — ABNORMAL HIGH (ref 4.7–7.2)
GAMMA GLOBULIN: 12.7 % (ref 11.1–18.8)
IGG (IMMUNOGLOBIN G), SERUM: 678 mg/dL — AB (ref 650–1600)
IgA: 242 mg/dL (ref 68–379)
IgM, Serum: 28 mg/dL — ABNORMAL LOW (ref 41–251)
M-Spike, %: NOT DETECTED g/dL
Total Protein: 5.6 g/dL — ABNORMAL LOW (ref 6.0–8.3)

## 2013-04-23 LAB — CREATININE CLEARANCE, URINE, 24 HOUR
CREAT CLEAR: 52 mL/min — AB (ref 75–125)
CREATININE 24H UR: 1994 mg/d (ref 800–2000)
Collection Interval-CRCL: 24 hours
Creatinine, Urine: 55.4 mg/dL
Creatinine: 2.67 mg/dL — ABNORMAL HIGH (ref 0.50–1.35)
Urine Total Volume-CRCL: 3600 mL

## 2013-04-23 LAB — BASIC METABOLIC PANEL
BUN: 67 mg/dL — ABNORMAL HIGH (ref 6–23)
CALCIUM: 8.2 mg/dL — AB (ref 8.4–10.5)
CO2: 23 mEq/L (ref 19–32)
Chloride: 102 mEq/L (ref 96–112)
Creatinine, Ser: 2.66 mg/dL — ABNORMAL HIGH (ref 0.50–1.35)
GFR calc Af Amer: 25 mL/min — ABNORMAL LOW (ref >90)
GFR, EST NON AFRICAN AMERICAN: 21 mL/min — AB (ref >90)
Glucose, Bld: 219 mg/dL — ABNORMAL HIGH (ref 70–99)
Potassium: 4.1 mEq/L (ref 3.7–5.3)
Sodium: 138 mEq/L (ref 137–147)

## 2013-04-23 LAB — CBC
HCT: 26.1 % — ABNORMAL LOW (ref 39.0–52.0)
Hemoglobin: 8.5 g/dL — ABNORMAL LOW (ref 13.0–17.0)
MCH: 29.3 pg (ref 26.0–34.0)
MCHC: 32.6 g/dL (ref 30.0–36.0)
MCV: 90 fL (ref 78.0–100.0)
PLATELETS: 217 10*3/uL (ref 150–400)
RBC: 2.9 MIL/uL — AB (ref 4.22–5.81)
RDW: 18.6 % — ABNORMAL HIGH (ref 11.5–15.5)
WBC: 10.1 10*3/uL (ref 4.0–10.5)

## 2013-04-23 LAB — PROTEIN, URINE, 24 HOUR
COLLECTION INTERVAL-UPROT: 24 h
Protein, 24H Urine: 468 mg/d — ABNORMAL HIGH (ref 50–100)
Protein, Urine: 13 mg/dL
Urine Total Volume-UPROT: 3600 mL

## 2013-04-23 LAB — GLUCOSE, CAPILLARY
GLUCOSE-CAPILLARY: 228 mg/dL — AB (ref 70–99)
GLUCOSE-CAPILLARY: 205 mg/dL — AB (ref 70–99)
Glucose-Capillary: 194 mg/dL — ABNORMAL HIGH (ref 70–99)
Glucose-Capillary: 211 mg/dL — ABNORMAL HIGH (ref 70–99)

## 2013-04-23 LAB — GLUCOSE 6 PHOSPHATE DEHYDROGENASE: G6PDH: 18.1 U/g Hgb (ref 7.0–20.5)

## 2013-04-23 LAB — HAPTOGLOBIN: HAPTOGLOBIN: 186 mg/dL (ref 45–215)

## 2013-04-23 LAB — PSA: PSA: 1.89 ng/mL (ref <=4.00)

## 2013-04-23 MED ORDER — ASPIRIN EC 81 MG PO TBEC
81.0000 mg | DELAYED_RELEASE_TABLET | Freq: Every day | ORAL | Status: DC
  Administered 2013-04-23 – 2013-04-28 (×6): 81 mg via ORAL
  Filled 2013-04-23 (×6): qty 1

## 2013-04-23 NOTE — Progress Notes (Addendum)
Haptoglobin normal. Coags normal   Nothing to corroborate a hemolytic process.  Elevated retic count remains a mystery unless it is a normal bone marrow response to acute blood loss, which, if this significant, should be clinically obvious. 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 Multiple sclerotic lesions in vertebral bodies not seen on a 2012 study.  Reviewed with Radiologist - lesions appear more typical of met prostate cancer and not multiple myeloma. PSA not elevated @ 1.9. He recommends a bone scan as next test.   Impression: #1. Acute on chronic normochromic anemia. Acute blood loss vs underlying bone marrow disorder. 500 mg protein in urine - could all be from DM/HTN.  Immuno studies pending. Serum total immunoglobulins all suppressed. Hb stable post transfusion x 2.  #2. Sclerotic bone lesions/hx prostate ca S/P prostatectomy about 10 yrs ago.  Current PSA normal.No obvious lung lesions on non contrast CT I will order bone scan. Will likely need PET scan.   Daughter [name] updated by phone Case discussed with attending hospitalist

## 2013-04-23 NOTE — Evaluation (Signed)
Occupational Therapy Evaluation Patient Details Name: [name] MRN: [id number] DOB: 27-Aug-1932 Today's Date: 04/23/2013 Time: 0086-7619 OT Time Calculation (min): 30 min  OT Assessment / Plan / Recommendation History of present illness 80 yo male admitted with anemia, fall. Hx of HTN, DM, CHF, Afib, CVA. Pt lives alone.   Clinical Impression   Pt presents to OT with decreased I with ADL activity and will benefit from skilled OT to increase I with ADL activity and return to PLOF    OT Assessment  Patient needs continued OT Services    Follow Up Recommendations  SNF       Equipment Recommendations  Tub/shower seat       Frequency  Min 2X/week    Precautions / Restrictions Precautions Precautions: Fall Restrictions Weight Bearing Restrictions: No       ADL  Where Assessed - Grooming: Unsupported sitting Upper Body Bathing: Minimal assistance Where Assessed - Upper Body Bathing: Unsupported sitting Lower Body Bathing: Moderate assistance Where Assessed - Lower Body Bathing: Supported sit to stand Upper Body Dressing: Minimal assistance Where Assessed - Upper Body Dressing: Supported sit to stand Lower Body Dressing: Maximal assistance Where Assessed - Lower Body Dressing: Supported sit to Lobbyist Method: Sit to Loss adjuster, chartered: Other (comment) (urinal) Toileting - Clothing Manipulation and Hygiene: Minimal assistance Where Assessed - Toileting Clothing Manipulation and Hygiene: Standing ADL Comments: Pts abdomen is swollen, would benefit from skilled OT to increase I with LB dressing as he did report he gets winded with this task    OT Diagnosis: Generalized weakness  OT Problem List: Decreased strength OT Treatment Interventions: Self-care/ADL training;Patient/family education;DME and/or AE instruction   OT Goals(Current goals can be found in the care plan section) Acute Rehab OT Goals Patient Stated Goal: to get better and d/c to  daughter's home OT Goal Formulation: With patient Time For Goal Achievement: 05/07/13 Potential to Achieve Goals: Good ADL Goals Pt Will Perform Lower Body Bathing: with min assist;with adaptive equipment Pt Will Perform Lower Body Dressing: with min guard assist;with adaptive equipment Pt Will Transfer to Toilet: with supervision;bedside commode Pt Will Perform Toileting - Clothing Manipulation and hygiene: with supervision;sit to/from stand  Visit Information  Last OT Received On: 04/23/13 Assistance Needed: +1 History of Present Illness: 80 yo male admitted with anemia, fall. Hx of HTN, DM, CHF, Afib, CVA. Pt lives alone.       Prior [place] expects to be discharged to:: Skilled nursing facility Living Arrangements: Alone Type of Home: Apartment Home Access: Ramped entrance Home Layout: One level Home Equipment: Wheelchair - power;Walker - 4 wheels;Cane - quad;Cane - single point;Hand held shower head;Tub bench Additional Comments: uses wheelchair inside apt; walker in community Prior Function Level of Independence: Independent with assistive device(s) Communication Communication: No difficulties (pt mumbles) Dominant Hand: Right         Vision/Perception Vision - History Patient Visual Report: No change from baseline   Cognition  Cognition Arousal/Alertness: Awake/alert Behavior During Therapy: WFL for tasks assessed/performed Overall Cognitive Status: Within Functional Limits for tasks assessed (hard to understand)    Extremity/Trunk Assessment Upper Extremity Assessment Upper Extremity Assessment: Generalized weakness     Mobility Bed Mobility General bed mobility comments: Pt OOB in recliner Transfers Overall transfer level: Needs assistance Equipment used: Rolling walker (2 wheeled) Transfers: Sit to/from Stand Sit to Stand: Min guard;Min assist General transfer comment:  pt tends to "plop" uncontrolled.  Balance Balance Sitting balance-Leahy Scale: Good Standing balance-Leahy Scale: Fair   End of Session OT - End of Session Activity Tolerance: Patient tolerated treatment well Patient left: in chair;with call bell/phone within reach Nurse Communication: Mobility status  GO     Betsy Pries 04/23/2013, 1:00 PM

## 2013-04-23 NOTE — Progress Notes (Signed)
Pt bleeding around the catheter, large blood clot noted while pt sitting up. MD paged and notified, no new orders at this time. Will continue to monitor.

## 2013-04-23 NOTE — Progress Notes (Signed)
Clinical Social Work  Patient was discussed during progression meeting and RN reports that patient is now agreeable to SNF since talking with dtr. CSW met with patient at bedside who reports he wants to go to [facility]. Dtr had left her number in room and CSW was given permission from patient to contact dtr. CSW left a message on dtr's phone with CSW contact information. CSW spoke with [facility] will review information and let CSW know if they can accept.  CSW informed patient to review list and have alternative options in case [facility] is unable to accept and CSW will explain this information to dtr as well once she returns call.  CSW faxed all clinicals to [facility] for authorization and will continue to follow.  [place], [place] [phone]

## 2013-04-23 NOTE — Progress Notes (Addendum)
Clinical Social Work Department CLINICAL SOCIAL WORK PLACEMENT NOTE 04/23/2013  Patient:  [name], [name]  Account Number:  [id number] Admit date:  04/20/2013  Clinical Social Worker:  Sindy Messing, LCSW  Date/time:  04/23/2013 09:45 AM  Clinical Social Work is seeking post-discharge placement for this patient at the following level of care:   [place]   (*CSW will update this form in Epic as items are completed)   04/23/2013  Patient/family provided with [place] Department of Clinical Social Work's list of facilities offering this level of care within the geographic area requested by the patient (or if unable, by the patient's family).  04/23/2013  Patient/family informed of their freedom to choose among providers that offer the needed level of care, that participate in Medicare, Medicaid or managed care program needed by the patient, have an available bed and are willing to accept the patient.  04/23/2013  Patient/family informed of [facility]' ownership interest in [facility], as well as of the fact that they are under no obligation to receive care at this facility.  PASARR submitted to EDS on existing # PASARR number received from EDS on   FL2 transmitted to all facilities in geographic area requested by pt/family on  04/23/2013 FL2 transmitted to all facilities within larger geographic area on   Patient informed that his/her managed care company has contracts with or will negotiate with  certain facilities, including the following:     Patient/family informed of bed offers received:  04/24/13 Patient chooses bed at [facility] Physician recommends and patient chooses bed at    Patient to be transferred to [place] on  04/28/13 Patient to be transferred to facility by [facility]  The following physician request were entered in Epic:   Additional Comments:

## 2013-04-23 NOTE — Progress Notes (Signed)
Clinical Social Work  CSW received call from RN reporting that patient's dtr was in room and wanted to talk with CSW. CSW met with patient, dtr, and son-in-law at bedside. Dtr reports that she has reviewed options and is also interested in [facility]. CSW explained that CSW would talk with [facility] to determine if they accepted patient's insurance and if they had available space. If [facility] is not able to accept then patient and family want [facility]. CSW left a message with [facility] and will await response.  CSW will continue to follow.  [place], [place] [phone]

## 2013-04-23 NOTE — Progress Notes (Signed)
Physical Therapy Treatment Patient Details Name: MURICE BARBAR MRN: [id number] DOB: [birth year] Today's Date: 04/23/2013 Time: 6222-9798 PT Time Calculation (min): 25 min  PT Assessment / Plan / Recommendation  History of Present Illness 80 yo male admitted with anemia, fall. Hx of HTN, DM, CHF, Afib, CVA. Pt lives alone.   PT Comments   Pt OOB in recliner "feeling better".  Amb in hallway twice with one long sitting rest break due to MOD c/o SOB and noted RA sats decreased to 77%.  Also assisted to BR for BM but mostly gas.  Slow/sluggish and HIGH FALL RISK.  Pt required increased time and will need ST Rehab at SNF to achieve safe level of mobility/function to return home alone.   Follow Up Recommendations  SNF     Does the patient have the potential to tolerate intense rehabilitation     Barriers to Discharge        Equipment Recommendations       Recommendations for Other Services    Frequency Min 3X/week   Progress towards PT Goals Progress towards PT goals: Progressing toward goals  Plan      Precautions / Restrictions Precautions Precautions: Fall Restrictions Weight Bearing Restrictions: No   Pertinent Vitals/Pain No c/o pain MOD c/o SOB with activity    Mobility  Bed Mobility General bed mobility comments: Pt OOB in recliner Transfers Overall transfer level: Needs assistance Equipment used: Rolling walker (2 wheeled) Transfers: Sit to/from Stand Sit to Stand: Min guard;Min assist General transfer comment: 25% VC's on sit to stand and 50% VC's stand to sit as pt tends to "plop" uncontrolled. Ambulation/Gait Ambulation/Gait assistance: Min guard;Min assist Ambulation Distance (Feet): 110 Feet (55 feet x 2 one sitting rest break) Assistive device: Rolling walker (2 wheeled) Gait Pattern/deviations: Decreased stride length;Trunk flexed Gait velocity: decreased General Gait Details: Required increased time (slow gait) and recliner following for safety as pt c/o  fatigue and MOD SOB.  RA decreased to 77% with noted 2/4 DOE.  Required long sitting rest break to recover to 96% RA.  Amb pt twice.    PT Goals (current goals can now be found in the care plan section)    Visit Information  Last PT Received On: 04/23/13 Assistance Needed: +1 History of Present Illness: 80 yo male admitted with anemia, fall. Hx of HTN, DM, CHF, Afib, CVA. Pt lives alone.    Subjective Data      Cognition       Balance     End of Session PT - End of Session Equipment Utilized During Treatment: Gait belt Activity Tolerance: Patient limited by fatigue Patient left: in chair;with call bell/phone within reach   Rica Koyanagi  PTA [facility]  Acute  Rehab Pager      [phone]

## 2013-04-23 NOTE — Progress Notes (Signed)
[facility] PROGRESS NOTE  REFOEL PALLADINO UJW:[id number] DOB: 03/15/1932 DOA: 04/20/2013 PCP: [name], MD  Assessment/Plan: 80 year old man withHTN, CAD, CHF, IDDM, CKD, PAF, CKD with currently recorded creatinine of 2.3 presented to the emergency department on 04/20/2013 complaining of generalized weakness and dyspnea  found to be profoundly anemic with a hemoglobin of 4.8  1. Anemia IDA+likely worse with blood loss; + hem occults+hematuria   -s/p upper GI endoscopy which showed benign gastric polyps. -2 units of blood transfusion on admission; TFsed 2 units 3/4; monitor  -r/o MM pend test results; CT: Multiple new round sclerotic lesions in the thoracic spine, most  Compatible with blastic osseous metastatic disease; pend bone scan; PSA WNL;  -appreciate hem/onc input   2. Hematuria hematuria last night and urology was consulted.  -s/p cystoscopy which showed tear in the urethra. Patient has a Foley catheter in place, which needs to stay there for a week.  -follow the urology in one week for a voiding trial/Foley removal   3. Community acquired pneumonia; chest x-ray right hilar contour and middle lobe opacity - on Levaquin, improving   4. GI bleed history of hemorrhoids as well as gastritis. He had both endoscopy and colonoscopy 5 years ago.  -GI performed EGD, no plans for colonoscopy. Recommendation for low-dose PPI 20 mg of daily   5. Congestive heart failure echo (2013): LVEF 56%, grade 2 diastolic dysfunction -continue Demadex  6. Diabetes mellitus; HA1C-7.4 (07/2012) -Continue sliding scale insulin; d/c metformin not candidate due to CKD  7. Hypertension  Continue home regimen including hydralazine, Catapres patch.   8. History of CAD Continue Imdur, aspirin.   9. PAF not on AC due to GIB, bleeding, fall risk; cont amiodarone; not on  BB;   DVT prophylaxis  SCDs  D/w patient, he is DNR; confirmed with her daughter   Code Status: DNR  Family Communication:  d/w patient, [name] Daughter [phone]  (indicate person spoken with, relationship, and if by phone, the number) Disposition Plan: pend PT    Consultants:  Hem onc,  Consultants:  GI  Hematology Procedures:  EGD- showed benign-appearing gastric polyps  Cystoscopy- urethral tear   Antibiotics:  Levofloxacin 3/3<<<< (indicate start date, and stop date if known)  HPI/Subjective: alert  Objective: Filed Vitals:   04/23/13 0648  BP: 137/63  Pulse: 83  Temp: 98.3 F (36.8 C)  Resp: 16    Intake/Output Summary (Last 24 hours) at 04/23/13 1022 Last data filed at 04/23/13 0854  Gross per 24 hour  Intake     25 ml  Output   2600 ml  Net  -2575 ml   Filed Weights   04/20/13 1652 04/21/13 1137  Weight: 109.5 kg (241 lb 6.5 oz) 109.317 kg (241 lb)    Exam:   General:  alert  Cardiovascular: s1,s2 rrr  Respiratory: few crackles in LL  Abdomen: soft, distended, nt  Musculoskeletal: no edema   Data Reviewed: Basic Metabolic Panel:  Recent Labs Lab 04/20/13 1116 04/21/13 0040 04/21/13 2255 04/22/13 1801 04/23/13 0535  NA 138 139  --   --  138  K 4.3 4.2  --   --  4.1  CL 103 103  --   --  102  CO2 22 24  --   --  23  GLUCOSE 266* 130*  --   --  219*  BUN 69* 61*  --   --  67*  CREATININE 2.32* 2.30*  --  2.67* 2.66*  CALCIUM 9.1 8.6  --   --  8.2*  MG  --   --  2.3  --   --    Liver Function Tests:  Recent Labs Lab 04/20/13 1116 04/21/13 0040 04/21/13 1405  AST 15 16  --   ALT 13 13  --   ALKPHOS 60 55  --   BILITOT <0.2* 0.3  --   PROT 6.1 5.6* 5.6*  ALBUMIN 2.9* 2.7*  --    No results found for this basename: LIPASE, AMYLASE,  in the last 168 hours No results found for this basename: AMMONIA,  in the last 168 hours CBC:  Recent Labs Lab 04/20/13 1116 04/20/13 2034 04/21/13 0040  04/21/13 1405 04/21/13 2255 04/22/13 0820 04/22/13 2335 04/23/13 0535  WBC 12.4* 14.6* 15.6*  --   --   --   --    --  10.1  NEUTROABS 10.7*  --   --   --   --   --   --   --   --   HGB 4.8* 5.7* 6.3*  < > 7.2* 7.0* 6.5* 7.9* 8.5*  HCT 16.1* 18.7* 20.4*  < > 23.0* 22.1* 21.1* 25.2* 26.1*  MCV 91.0 90.8 89.1  --   --   --   --   --  90.0  PLT 276 289 258  --   --   --   --   --  217  < > = values in this interval not displayed. Cardiac Enzymes: No results found for this basename: CKTOTAL, CKMB, CKMBINDEX, TROPONINI,  in the last 168 hours BNP (last 3 results)  Recent Labs  04/03/13 1155 04/20/13 1116  PROBNP 893.8* 1139.0*   CBG:  Recent Labs Lab 04/21/13 2205 04/22/13 0828 04/22/13 1638 04/22/13 2136 04/23/13 0754  GLUCAP 109* 228* 215* 241* 194*    No results found for this or any previous visit (from the past 240 hour(s)).   Studies: Ct Chest Wo Contrast  04/22/2013   ADDENDUM REPORT: 04/22/2013 11:35  ADDENDUM: Study discussed by telephone with Dr. Murriel Hopper on 04/22/2013 at 11:35 .   Electronically Signed   By: Lars Pinks M.D.   On: 04/22/2013 11:35   04/22/2013   CLINICAL DATA:  80 year old male with shortness of breath. Weakness. Recent abnormal chest x-ray. Initial encounter.  EXAM: CT CHEST WITHOUT CONTRAST  TECHNIQUE: Multidetector CT imaging of the chest was performed following the standard protocol without IV contrast.  COMPARISON:  Chest radiographs 04/20/2013 and earlier. Chest CTA 09/23/2010. Chest CT 09/22/2010.  FINDINGS: Chronic lung disease with emphysema and peripheral reticular opacity suggesting a degree of fibrosis. There is combined fibrosis and bronchiectasis suspected in the superior segment of the right lower lobe.  Superimposed small bilateral layering pleural effusions, slightly larger on the right. Associated dependent compressive atelectasis. Superimposed more confluent and less dependent mostly sub solid opacity in the posterior left upper lobe and superior segment left lower lobe (series 5, images 19-23).  Scattered small bilateral calcified granulomas. Major  airways are patent.  Cardiomegaly. No pericardial effusion. Aortic and coronary artery calcified plaque. Negative non contrast thoracic inlet. No mediastinal lymphadenopathy. Mildly increased hilar and subcarinal nodal tissue appears stable allowing for no IV contrast today. No axillary lymphadenopathy.  Negative visualized non contrast liver, spleen, pancreas, and upper abdominal bowel. Cholelithiasis.  Chronic soft tissue enlargement of the left adrenal gland with chronic dystrophic appearing calcification. This is partially visible today and was partially visible in 2012, the visualized  portion is stable. There is mild chronic right adrenal gland thickening, probably related to hyperplasia and not suspicious in appearance.  Chronic degenerative changes in the spine with osteophytosis. However, there are multiple (approximately  eight) new round sclerotic foci scattered in the thoracic spine, individually measuring up to 9 mm. No lytic or destructive osseous lesion identified. Sternum and manubrium appear spared along with the ribs and visible lumbar spine.  IMPRESSION: 1. 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 No primary identified. Consider metastatic prostate cancer. 2. Severe chronic lung disease, progressed since 2012. Small layering pleural effusions. Suspect superimposed acute infectious exacerbation in the posterior left upper lobe and superior segment left lower lobe. 3. No suspicious lung mass.  No mediastinal lymphadenopathy. 4. Chronic nonspecific but stable appearing (since 09/22/2010) left adrenal gland enlargement with dystrophic calcification. Benign etiology strongly favored. These results will be called to the ordering clinician or representative by the Radiologist Assistant, and communication documented in the PACS Dashboard.  Electronically Signed: By: Lars Pinks M.D. On: 04/22/2013 11:22    Scheduled Meds: .  amiodarone  100 mg Oral q morning - 10a  . amLODipine  10 mg Oral Daily  . aspirin  325 mg Oral Daily  . atorvastatin  40 mg Oral q1800  . citalopram  10 mg Oral BID  . [START ON 04/26/2013] cloNIDine  0.1 mg Transdermal Weekly  . hydrALAZINE  100 mg Oral 3 times per day  . insulin aspart  0-9 Units Subcutaneous TID WC  . insulin detemir  50 Units Subcutaneous BH-q7a  . isosorbide mononitrate  60 mg Oral Daily  . levofloxacin (LEVAQUIN) IV  750 mg Intravenous Q48H  . levothyroxine  125 mcg Oral QAC breakfast  .  morphine injection  2 mg Intravenous Once  . pantoprazole  40 mg Oral BID  . potassium chloride SA  20 mEq Oral Daily  . sodium chloride  3 mL Intravenous Q12H  . torsemide  60 mg Oral BID   Continuous Infusions:   Principal Problem:   Anemia Active Problems:   HYPERTENSION   CHRONIC KIDNEY DISEASE STAGE III (MODERATE)   Acute on chronic diastolic heart failure   Normochromic normocytic anemia   Abnormal CXR   Hypertensive kidney disease with CKD stage III   Abnormal bone radiograph    Time spent: >35 minutes     Kinnie Feil  [facility] Pager [phone]. If 7PM-7AM, please contact night-coverage at [email or web address], password [facility] 04/23/2013, 10:22 AM  LOS: 3 days

## 2013-04-23 NOTE — Progress Notes (Signed)
Inpatient Diabetes Program Recommendations  AACE/ADA: New Consensus Statement on Inpatient Glycemic Control (2013)  Target Ranges:  Prepandial:   less than 140 mg/dL      Peak postprandial:   less than 180 mg/dL (1-2 hours)      Critically ill patients:  140 - 180 mg/dL   Hyperglycemia sustained in the 200 range since pt started eating. Lantus at 50 units and sensitive correction scale tidwc. Please consider increase in Lantus to 60 units and/or increase correction to moderate tidwc Thank you, Rosita Kea, RN, CNS, Diabetes Coordinator [phone])

## 2013-04-24 ENCOUNTER — Inpatient Hospital Stay (HOSPITAL_COMMUNITY): Payer: Medicare PPO

## 2013-04-24 ENCOUNTER — Encounter (HOSPITAL_COMMUNITY): Payer: Self-pay | Admitting: Radiology

## 2013-04-24 ENCOUNTER — Encounter (HOSPITAL_COMMUNITY): Payer: Self-pay

## 2013-04-24 LAB — UIFE/LIGHT CHAINS/TP QN, 24-HR UR
ALBUMIN, U: DETECTED
ALPHA 1 UR: DETECTED — AB
Alpha 2, Urine: DETECTED — AB
Beta, Urine: DETECTED — AB
Free Kappa Lt Chains,Ur: 6.22 mg/dL — ABNORMAL HIGH (ref 0.14–2.42)
Free Kappa/Lambda Ratio: 6.35 ratio (ref 2.04–10.37)
Free Lambda Excretion/Day: 35.28 mg/d
Free Lambda Lt Chains,Ur: 0.98 mg/dL — ABNORMAL HIGH (ref 0.02–0.67)
Free Lt Chn Excr Rate: 223.92 mg/d
GAMMA UR: DETECTED — AB
TOTAL PROTEIN, URINE-UR/DAY: 500 mg/d — AB (ref 10–140)
Time: 24 hours
Total Protein, Urine: 13.9 mg/dL
Volume, Urine: 3600 mL

## 2013-04-24 LAB — CBC
HCT: 25.7 % — ABNORMAL LOW (ref 39.0–52.0)
Hemoglobin: 8.2 g/dL — ABNORMAL LOW (ref 13.0–17.0)
MCH: 28.4 pg (ref 26.0–34.0)
MCHC: 31.9 g/dL (ref 30.0–36.0)
MCV: 88.9 fL (ref 78.0–100.0)
PLATELETS: 242 10*3/uL (ref 150–400)
RBC: 2.89 MIL/uL — AB (ref 4.22–5.81)
RDW: 17.9 % — ABNORMAL HIGH (ref 11.5–15.5)
WBC: 8.9 10*3/uL (ref 4.0–10.5)

## 2013-04-24 LAB — GLUCOSE, CAPILLARY
Glucose-Capillary: 149 mg/dL — ABNORMAL HIGH (ref 70–99)
Glucose-Capillary: 220 mg/dL — ABNORMAL HIGH (ref 70–99)
Glucose-Capillary: 193 mg/dL — ABNORMAL HIGH (ref 70–99)
Glucose-Capillary: 195 mg/dL — ABNORMAL HIGH (ref 70–99)

## 2013-04-24 MED ORDER — POLYSACCHARIDE IRON COMPLEX 150 MG PO CAPS
150.0000 mg | ORAL_CAPSULE | Freq: Every day | ORAL | Status: DC
  Administered 2013-04-24 – 2013-04-28 (×5): 150 mg via ORAL
  Filled 2013-04-24 (×5): qty 1

## 2013-04-24 MED ORDER — TECHNETIUM TC 99M MEDRONATE IV KIT
25.0000 | PACK | Freq: Once | INTRAVENOUS | Status: AC | PRN
  Administered 2013-04-24: 25 via INTRAVENOUS

## 2013-04-24 NOTE — Progress Notes (Signed)
Hb holding since last transfusion G6PD normal Erythropoietin level significantly elevated at 779: this would predict low response to Erythrocyte stimulating agents such as procrit. No monoclonal spike on serum IFE IFE on urine pending but with total protein <500 mg, he would not meet criteria for light chain myeloma Creatinine clearance 52 Bone scan ordered - not yet done Impression: #1. Multifactorial anemia with exaggerated retic response Acute blood loss? Not clinically obvious; chronic renal insufficiency; underlying malignancy? #2. Sclerotic lesion vertebral bodies PSA negative met prostate ca?  Possible but would be unusual Occult lung ca?  Not obvious from non contrast CT of chest Let's see if we get any additional useful information from bone scan. We may need to biopsy most easily accesible lesion to get a dx. Hard to imagine that these scattered lesion could be major contributor to his anemia. I am going to start him on oral iron.

## 2013-04-24 NOTE — Progress Notes (Signed)
Clinical Social Work  CSW spoke with [name] at [facility] who reports she will review referral and run patient's insurance to determine if they can make a bed offer. CSW will await to hear from [facility] and will update patient and family on plans.  [place], [place] [phone]

## 2013-04-24 NOTE — Progress Notes (Signed)
ANTIBIOTIC CONSULT NOTE - FOLLOW UP  Pharmacy Consult for Levaquin Indication: CAP  Allergies  Allergen Reactions  . Ace Inhibitors Other (See Comments)    me  hyposion  . Beta Adrenergic Blockers Other (See Comments)     use cautiously secondary to 2nd degree heart block, hypotension    Patient Measurements: Height: 5\' 10"  (177.8 cm) Weight: 241 lb (109.317 kg) IBW/kg (Calculated) : 73   Vital Signs: Temp: 98.6 F (37 C) (03/06 0643) Temp src: Oral (03/06 0643) BP: 137/60 mmHg (03/06 0643) Pulse Rate: 64 (03/06 0643) Intake/Output from previous day: 03/05 0701 - 03/06 0700 In: 1350 [P.O.:1200; IV Piggyback:150] Out: 6400 [Urine:6400] Intake/Output from this shift: Total I/O In: 240 [P.O.:240] Out: -   Labs:  Recent Labs  04/22/13 0820 04/22/13 1801 04/22/13 2335 04/23/13 0535 04/24/13 0555  WBC  --   --   --  10.1 8.9  HGB 6.5*  --  7.9* 8.5* 8.2*  PLT  --   --   --  217 242  LABCREA  --  55.4  --   --   --   CREATININE  --  2.67*  --  2.66*  --    Estimated Creatinine Clearance: 28.3 ml/min (by C-G formula based on Cr of 2.66).    Microbiology: No results found for this or any previous visit (from the past 720 hour(s)).  Anti-infectives   Start     Dose/Rate Route Frequency Ordered Stop   04/21/13 1000  levofloxacin (LEVAQUIN) IVPB 750 mg  Status:  Discontinued     750 mg 100 mL/hr over 90 Minutes Intravenous Every 24 hours 04/20/13 1804 04/20/13 1805   04/21/13 1000  levofloxacin (LEVAQUIN) IVPB 750 mg     750 mg 100 mL/hr over 90 Minutes Intravenous Every 48 hours 04/20/13 1805     04/20/13 1430  cefTRIAXone (ROCEPHIN) 1 g in dextrose 5 % 50 mL IVPB     1 g 100 mL/hr over 30 Minutes Intravenous  Once 04/20/13 1425 04/22/13 2146   04/20/13 1430  azithromycin (ZITHROMAX) tablet 500 mg     500 mg Oral  Once 04/20/13 1425 04/20/13 1605      Assessment: 80 y/o M on D# 4 Levaquin 750 mg IV q48h for CAP.  Afebrile  Leukocytosis resolved  SCr  2.66, stable on 3/5.   Est CrCl 28 mL/min  Goal of Therapy:  Appropriate antibiotic dosing for renal function; eradication of infection  Plan:  1. Continue Levaquin 750 mg IV q48h for CrCl 20 - 49 mL/min 2. Follow serum creatinine, clinical course.   Clayburn Pert, PharmD, BCPS Pager: [phone] 04/24/2013  11:14 AM

## 2013-04-24 NOTE — Progress Notes (Signed)
[facility] PROGRESS NOTE  ANTIONO ETTINGER DGU:[id number] DOB: December 23, 1932 DOA: 04/20/2013 PCP: [name], MD  Assessment/Plan: 80 year old man withHTN, CAD, CHF, IDDM, CKD, PAF, CKD with currently recorded creatinine of 2.3 presented to the emergency department on 04/20/2013 complaining of generalized weakness and dyspnea  found to be profoundly anemic with a hemoglobin of 4.8  1. Anemia multifactorial: IDA+likely worse with blood loss; + hem occults+hematuria   -s/p upper GI endoscopy which showed benign gastric polyps. -2 units of blood transfusion on admission; TFsed 2 units 3/4; monitor  -r/o MM pend final test results; CT: Multiple new round sclerotic lesions in the thoracic spine, most  Compatible with blastic osseous metastatic disease; pend bone scan; PSA WNL;  -appreciate hem/onc input   2. Hematuria hematuria last night and urology was consulted.  -s/p cystoscopy which showed tear in the urethra. Patient has a Foley catheter in place, which needs to stay there for a week.  -follow the urology in one week for a voiding trial/Foley removal   3. Community acquired pneumonia; chest x-ray right hilar contour and middle lobe opacity - on Levaquin, improving   4. GI bleed history of hemorrhoids as well as gastritis. He had both endoscopy and colonoscopy 5 years ago.  -GI performed EGD, no plans for colonoscopy. Recommendation for low-dose PPI 20 mg of daily   5. Congestive heart failure echo (2013): LVEF 95%, grade 2 diastolic dysfunction -continue Demadex  6. Diabetes mellitus; HA1C-7.4 (07/2012) -Continue sliding scale insulin; d/c metformin not candidate due to CKD  7. Hypertension  Continue home regimen including hydralazine, Catapres patch.   8. History of CAD Continue Imdur, aspirin.   9. PAF not on AC due to GIB, bleeding, fall risk; cont amiodarone; not on  BB;   DVT prophylaxis  SCDs  D/w patient, he is DNR; confirmed with her daughter   Code Status: DNR   Family Communication: d/w patient, [name] Daughter [phone]  (indicate person spoken with, relationship, and if by phone, the number) Disposition Plan: pend PT    Consultants:  Hem onc,  Consultants:  GI  Hematology Procedures:  EGD- showed benign-appearing gastric polyps  Cystoscopy- urethral tear   Antibiotics:  Levofloxacin 3/3<<<< (indicate start date, and stop date if known)  HPI/Subjective: alert  Objective: Filed Vitals:   04/24/13 0643  BP: 137/60  Pulse: 64  Temp: 98.6 F (37 C)  Resp: 16    Intake/Output Summary (Last 24 hours) at 04/24/13 0902 Last data filed at 04/24/13 0644  Gross per 24 hour  Intake   1110 ml  Output   4800 ml  Net  -3690 ml   Filed Weights   04/20/13 1652 04/21/13 1137  Weight: 109.5 kg (241 lb 6.5 oz) 109.317 kg (241 lb)    Exam:   General:  alert  Cardiovascular: s1,s2 rrr  Respiratory: few crackles in LL  Abdomen: soft, distended, nt  Musculoskeletal: no edema   Data Reviewed: Basic Metabolic Panel:  Recent Labs Lab 04/20/13 1116 04/21/13 0040 04/21/13 2255 04/22/13 1801 04/23/13 0535  NA 138 139  --   --  138  K 4.3 4.2  --   --  4.1  CL 103 103  --   --  102  CO2 22 24  --   --  23  GLUCOSE 266* 130*  --   --  219*  BUN 69* 61*  --   --  67*  CREATININE 2.32* 2.30*  --  2.67* 2.66*  CALCIUM 9.1 8.6  --   --  8.2*  MG  --   --  2.3  --   --    Liver Function Tests:  Recent Labs Lab 04/20/13 1116 04/21/13 0040 04/21/13 1405  AST 15 16  --   ALT 13 13  --   ALKPHOS 60 55  --   BILITOT <0.2* 0.3  --   PROT 6.1 5.6* 5.6*  ALBUMIN 2.9* 2.7*  --    No results found for this basename: LIPASE, AMYLASE,  in the last 168 hours No results found for this basename: AMMONIA,  in the last 168 hours CBC:  Recent Labs Lab 04/20/13 1116 04/20/13 2034 04/21/13 0040  04/21/13 2255 04/22/13 0820 04/22/13 2335 04/23/13 0535 04/24/13 0555  WBC 12.4* 14.6*  15.6*  --   --   --   --  10.1 8.9  NEUTROABS 10.7*  --   --   --   --   --   --   --   --   HGB 4.8* 5.7* 6.3*  < > 7.0* 6.5* 7.9* 8.5* 8.2*  HCT 16.1* 18.7* 20.4*  < > 22.1* 21.1* 25.2* 26.1* 25.7*  MCV 91.0 90.8 89.1  --   --   --   --  90.0 88.9  PLT 276 289 258  --   --   --   --  217 242  < > = values in this interval not displayed. Cardiac Enzymes: No results found for this basename: CKTOTAL, CKMB, CKMBINDEX, TROPONINI,  in the last 168 hours BNP (last 3 results)  Recent Labs  04/03/13 1155 04/20/13 1116  PROBNP 893.8* 1139.0*   CBG:  Recent Labs Lab 04/23/13 0754 04/23/13 1200 04/23/13 1650 04/23/13 2123 04/24/13 0726  GLUCAP 194* 211* 228* 205* 149*    No results found for this or any previous visit (from the past 240 hour(s)).   Studies: Ct Chest Wo Contrast  04/22/2013   ADDENDUM REPORT: 04/22/2013 11:35  ADDENDUM: Study discussed by telephone with Dr. Murriel Hopper on 04/22/2013 at 11:35 .   Electronically Signed   By: Lars Pinks M.D.   On: 04/22/2013 11:35   04/22/2013   CLINICAL DATA:  80 year old male with shortness of breath. Weakness. Recent abnormal chest x-ray. Initial encounter.  EXAM: CT CHEST WITHOUT CONTRAST  TECHNIQUE: Multidetector CT imaging of the chest was performed following the standard protocol without IV contrast.  COMPARISON:  Chest radiographs 04/20/2013 and earlier. Chest CTA 09/23/2010. Chest CT 09/22/2010.  FINDINGS: Chronic lung disease with emphysema and peripheral reticular opacity suggesting a degree of fibrosis. There is combined fibrosis and bronchiectasis suspected in the superior segment of the right lower lobe.  Superimposed small bilateral layering pleural effusions, slightly larger on the right. Associated dependent compressive atelectasis. Superimposed more confluent and less dependent mostly sub solid opacity in the posterior left upper lobe and superior segment left lower lobe (series 5, images 19-23).  Scattered small bilateral  calcified granulomas. Major airways are patent.  Cardiomegaly. No pericardial effusion. Aortic and coronary artery calcified plaque. Negative non contrast thoracic inlet. No mediastinal lymphadenopathy. Mildly increased hilar and subcarinal nodal tissue appears stable allowing for no IV contrast today. No axillary lymphadenopathy.  Negative visualized non contrast liver, spleen, pancreas, and upper abdominal bowel. Cholelithiasis.  Chronic soft tissue enlargement of the left adrenal gland with chronic dystrophic appearing calcification. This is partially visible today and was partially visible in 2012, the visualized portion is stable. There is mild  chronic right adrenal gland thickening, probably related to hyperplasia and not suspicious in appearance.  Chronic degenerative changes in the spine with osteophytosis. However, there are multiple (approximately  eight) new round sclerotic foci scattered in the thoracic spine, individually measuring up to 9 mm. No lytic or destructive osseous lesion identified. Sternum and manubrium appear spared along with the ribs and visible lumbar spine.  IMPRESSION: 1. 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 No primary identified. Consider metastatic prostate cancer. 2. Severe chronic lung disease, progressed since 2012. Small layering pleural effusions. Suspect superimposed acute infectious exacerbation in the posterior left upper lobe and superior segment left lower lobe. 3. No suspicious lung mass.  No mediastinal lymphadenopathy. 4. Chronic nonspecific but stable appearing (since 09/22/2010) left adrenal gland enlargement with dystrophic calcification. Benign etiology strongly favored. These results will be called to the ordering clinician or representative by the Radiologist Assistant, and communication documented in the PACS Dashboard.  Electronically Signed: By: Lars Pinks M.D. On: 04/22/2013 11:22     Scheduled Meds: . amiodarone  100 mg Oral q morning - 10a  . amLODipine  10 mg Oral Daily  . aspirin EC  81 mg Oral Daily  . atorvastatin  40 mg Oral q1800  . citalopram  10 mg Oral BID  . [START ON 04/26/2013] cloNIDine  0.1 mg Transdermal Weekly  . hydrALAZINE  100 mg Oral 3 times per day  . insulin aspart  0-9 Units Subcutaneous TID WC  . insulin detemir  50 Units Subcutaneous BH-q7a  . iron polysaccharides  150 mg Oral Daily  . isosorbide mononitrate  60 mg Oral Daily  . levofloxacin (LEVAQUIN) IV  750 mg Intravenous Q48H  . levothyroxine  125 mcg Oral QAC breakfast  .  morphine injection  2 mg Intravenous Once  . pantoprazole  40 mg Oral BID  . potassium chloride SA  20 mEq Oral Daily  . sodium chloride  3 mL Intravenous Q12H  . torsemide  60 mg Oral BID   Continuous Infusions:   Principal Problem:   Anemia Active Problems:   HYPERTENSION   CHRONIC KIDNEY DISEASE STAGE III (MODERATE)   Acute on chronic diastolic heart failure   Normochromic normocytic anemia   Abnormal CXR   Hypertensive kidney disease with CKD stage III   Abnormal bone radiograph    Time spent: >35 minutes     Kinnie Feil  [facility] Pager [phone]. If 7PM-7AM, please contact night-coverage at [email or web address], password [email or web address] 04/24/2013, 9:02 AM  LOS: 4 days

## 2013-04-24 NOTE — Progress Notes (Signed)
Patient screened for [facility] Care Management services. However, noted discharge plan likely SNF. Therefore, [facility] Care Management would not be appropriate. Marthenia Rolling, MSN- [facility] [phone]

## 2013-04-24 NOTE — Progress Notes (Signed)
PT Cancellation Note  ___Treatment cancelled today due to medical issues with patient which prohibited therapy  _X_ Treatment cancelled today due to patient receiving procedure or test ............out of room  MRI   ___ Treatment cancelled today due to patient's refusal to participate   ___ Treatment cancelled today due to   Rica Koyanagi  PTA [facility]  Acute  Rehab Pager      [phone]

## 2013-04-24 NOTE — Progress Notes (Signed)
Clinical Social Work  [facility] is able to offer a bed. Patient and family agreeable to [facility]. [facility] informed and will submit authorization. CSW alerted [employer] that patient would not admit to their facility. CSW spoke with [employer] representative Danae Chen) who reports that she received clinicals and aware of DC plans. CSW will continue to follow.  [place], [place] [phone]

## 2013-04-25 DIAGNOSIS — R609 Edema, unspecified: Secondary | ICD-10-CM

## 2013-04-25 LAB — CBC WITH DIFFERENTIAL/PLATELET
Basophils Absolute: 0 K/uL (ref 0.0–0.1)
Basophils Relative: 0 % (ref 0–1)
Eosinophils Absolute: 0.3 K/uL (ref 0.0–0.7)
Eosinophils Relative: 3 % (ref 0–5)
HCT: 25.9 % — ABNORMAL LOW (ref 39.0–52.0)
Hemoglobin: 8 g/dL — ABNORMAL LOW (ref 13.0–17.0)
Lymphocytes Relative: 11 % — ABNORMAL LOW (ref 12–46)
Lymphs Abs: 0.8 K/uL (ref 0.7–4.0)
MCH: 27.9 pg (ref 26.0–34.0)
MCHC: 30.9 g/dL (ref 30.0–36.0)
MCV: 90.2 fL (ref 78.0–100.0)
Monocytes Absolute: 0.5 K/uL (ref 0.1–1.0)
Monocytes Relative: 7 % (ref 3–12)
Neutro Abs: 6.1 K/uL (ref 1.7–7.7)
Neutrophils Relative %: 79 % — ABNORMAL HIGH (ref 43–77)
Platelets: 254 K/uL (ref 150–400)
RBC: 2.87 MIL/uL — ABNORMAL LOW (ref 4.22–5.81)
RDW: 17.8 % — ABNORMAL HIGH (ref 11.5–15.5)
WBC: 7.8 K/uL (ref 4.0–10.5)

## 2013-04-25 LAB — GLUCOSE, CAPILLARY
GLUCOSE-CAPILLARY: 203 mg/dL — AB (ref 70–99)
GLUCOSE-CAPILLARY: 260 mg/dL — AB (ref 70–99)
Glucose-Capillary: 148 mg/dL — ABNORMAL HIGH (ref 70–99)
Glucose-Capillary: 150 mg/dL — ABNORMAL HIGH (ref 70–99)

## 2013-04-25 LAB — RETICULOCYTES
RBC.: 2.87 MIL/uL — ABNORMAL LOW (ref 4.22–5.81)
Retic Count, Absolute: 244 10*3/uL — ABNORMAL HIGH (ref 19.0–186.0)
Retic Ct Pct: 8.5 % — ABNORMAL HIGH (ref 0.4–3.1)

## 2013-04-25 NOTE — Progress Notes (Addendum)
[facility] PROGRESS NOTE  [name] GLO:[id number] DOB: 1932/09/01 DOA: 04/20/2013 PCP: [name], MD  Assessment/Plan: 80 year old man withHTN, CAD, CHF, IDDM, CKD, PAF, CKD with currently recorded creatinine of 2.3 presented to the emergency department on 04/20/2013 complaining of generalized weakness and dyspnea  found to be profoundly anemic with a hemoglobin of 4.8 found to have metastatic bone lesions   1. Anemia multifactorial: IDA+likely worse with blood loss; + hem occults+hematuria   -s/p upper GI endoscopy which showed benign gastric polyps. -2 units of blood transfusion on admission; TFsed 2 units 3/4; monitor   2. Metastatic bone lesions; h/o prostate CA s/p prostatectomy;  -likely metastatic lesions vs r/o MM pend final test results; -CT/MRI: Multiple new round sclerotic lesions in thoracolumbar spine, largest at L2. most  Compatible with blastic osseous metastatic disease; -Bone scan: There is no definite abnormal activity within the thoracic spine to correspond with the new sclerotic lesions on CT. There is lower right paraspinal activity corresponding with osteophytes on CT, but L 2 level;  -PSA WNL; d/w Dr. Inda Merlin who recommended outpatient follow up; appreciate hem/onc input     2. Hematuria hematuria last night and urology was consulted.  -s/p cystoscopy which showed tear in the urethra. Patient has a Foley catheter in place, which needs to stay there for a week.  -follow the urology in one week for a voiding trial/Foley removal   3. Community acquired pneumonia; chest x-ray right hilar contour and middle lobe opacity - on Levaquin, improving   4. GI bleed history of hemorrhoids as well as gastritis. He had both endoscopy and colonoscopy 5 years ago.  -GI performed EGD, no plans for colonoscopy. Recommendation for low-dose PPI 20 mg of daily   5. Congestive heart failure echo (2013): LVEF 18%, grade 2 diastolic dysfunction -continue Demadex  6.  Diabetes mellitus; HA1C-7.4 (07/2012) -Continue sliding scale insulin; d/c metformin not candidate due to CKD  7. Hypertension Continue home regimen including hydralazine, Catapres patch.   8. History of CAD Continue Imdur, aspirin.   9. PAF not on AC due to GIB, bleeding, fall risk; cont amiodarone; not on  BB;   DVT prophylaxis  SCDs  D/w patient, he is DNR; confirmed with her daughter   Code Status: DNR  Family Communication: d/w patient, [name] Daughter [phone]  (indicate person spoken with, relationship, and if by phone, the number) Disposition Plan: pend preauthorization   Consultants:  Hem onc,  Consultants:  GI  Hematology Procedures:  EGD- showed benign-appearing gastric polyps  Cystoscopy- urethral tear   Antibiotics:  Levofloxacin 3/3<<<< (indicate start date, and stop date if known)  HPI/Subjective: alert  Objective: Filed Vitals:   04/25/13 0649  BP: 132/63  Pulse: 68  Temp: 97.9 F (36.6 C)  Resp: 16    Intake/Output Summary (Last 24 hours) at 04/25/13 0949 Last data filed at 04/25/13 0900  Gross per 24 hour  Intake    483 ml  Output   3400 ml  Net  -2917 ml   Filed Weights   04/20/13 1652 04/21/13 1137  Weight: 109.5 kg (241 lb 6.5 oz) 109.317 kg (241 lb)    Exam:   General:  alert  Cardiovascular: s1,s2 rrr  Respiratory: few crackles in LL  Abdomen: soft, distended, nt  Musculoskeletal: no edema   Data Reviewed: Basic Metabolic Panel:  Recent Labs Lab 04/20/13 1116 04/21/13 0040 04/21/13 2255 04/22/13 1801 04/23/13 0535  NA 138 139  --   --  138  K 4.3 4.2  --   --  4.1  CL 103 103  --   --  102  CO2 22 24  --   --  23  GLUCOSE 266* 130*  --   --  219*  BUN 69* 61*  --   --  67*  CREATININE 2.32* 2.30*  --  2.67* 2.66*  CALCIUM 9.1 8.6  --   --  8.2*  MG  --   --  2.3  --   --    Liver Function Tests:  Recent Labs Lab 04/20/13 1116 04/21/13 0040 04/21/13 1405  AST  15 16  --   ALT 13 13  --   ALKPHOS 60 55  --   BILITOT <0.2* 0.3  --   PROT 6.1 5.6* 5.6*  ALBUMIN 2.9* 2.7*  --    No results found for this basename: LIPASE, AMYLASE,  in the last 168 hours No results found for this basename: AMMONIA,  in the last 168 hours CBC:  Recent Labs Lab 04/20/13 1116 04/20/13 2034 04/21/13 0040  04/22/13 0820 04/22/13 2335 04/23/13 0535 04/24/13 0555 04/25/13 0530  WBC 12.4* 14.6* 15.6*  --   --   --  10.1 8.9 7.8  NEUTROABS 10.7*  --   --   --   --   --   --   --  6.1  HGB 4.8* 5.7* 6.3*  < > 6.5* 7.9* 8.5* 8.2* 8.0*  HCT 16.1* 18.7* 20.4*  < > 21.1* 25.2* 26.1* 25.7* 25.9*  MCV 91.0 90.8 89.1  --   --   --  90.0 88.9 90.2  PLT 276 289 258  --   --   --  217 242 254  < > = values in this interval not displayed. Cardiac Enzymes: No results found for this basename: CKTOTAL, CKMB, CKMBINDEX, TROPONINI,  in the last 168 hours BNP (last 3 results)  Recent Labs  04/03/13 1155 04/20/13 1116  PROBNP 893.8* 1139.0*   CBG:  Recent Labs Lab 04/24/13 0726 04/24/13 1204 04/24/13 1716 04/24/13 2121 04/25/13 0736  GLUCAP 149* 220* 193* 195* 148*    No results found for this or any previous visit (from the past 240 hour(s)).   Studies: Mr Thoracic Spine Wo Contrast  04/24/2013   CLINICAL DATA:  Unexplained sclerotic lesions in the spine. History of prostate cancer with slowly rising serum PSA levels.  EXAM: MRI THORACIC AND LUMBAR SPINE WITHOUT CONTRAST  TECHNIQUE: Multiplanar and multiecho pulse sequences of the thoracic and lumbar spine were obtained without intravenous contrast.  COMPARISON:  NM BONE WHOLE BODY dated 04/24/2013; CT CHEST W/O CM dated 04/22/2013; CT ANGIO CHEST W/CM &/OR WO/CM dated 09/23/2010  FINDINGS: MR THORACIC SPINE FINDINGS  Sagittal localizing images of the cervical spine demonstrate no focal lesions or malalignment.  As demonstrated on the recent chest CT, there are several sclerotic lesions within the thoracic spine,  demonstrating uniformly low T1 and T2 signal. There is no surrounding marrow edema. Lesions are noted at T2, T5, T7, T8, T9 and T10. There are hemangiomas at T6 and T10. There is no evidence of pathologic fracture or epidural tumor.  The thoracic cord is normal in signal and caliber. There is no disc herniation, spinal stenosis or nerve root encroachment.  Small bilateral pleural effusions and chronic lung disease are similar to the recent chest CT.  MR LUMBAR SPINE FINDINGS  The images through the lumbar spine are moderately motion degraded. There is  a large sclerotic lesion nearly replacing the L2 vertebral body. This demonstrates increased uptake on today's bone scan. There are additional smaller lesions throughout all of the other lumbar vertebral bodies and upper sacrum. There is no evidence of pathologic fracture or epidural tumor.  The lumbar spinal canal is relatively small on a congenital basis. The conus medullaris extends to the L1-2 disc space level and appears normal.  There are scattered prominent retroperitoneal lymph nodes, best seen on the sagittal images. There is ectasia of the distal abdominal aorta which measures up to 3.5 cm in AP diameter.  L1-2: Mild disc bulging without significant resulting spinal stenosis or nerve root encroachment.  L2-3: Mild disc bulging. No significant spinal stenosis or nerve root encroachment.  L3-4: There is moderate disc bulging with mild facet and ligamentous hypertrophy. These factors contribute to mild spinal and lateral recess stenosis bilaterally. The foramina appear sufficiently patent.  L4-5: Mild disc bulging with mild facet and ligamentous hypertrophy. No significant spinal stenosis or nerve root encroachment.  L5-S1: There is chronic disc degeneration with annular disc bulging and paraspinal osteophytes. Mild facet degenerative changes are present bilaterally. There is mild narrowing of both foramina.  IMPRESSION: 1. As demonstrated on recent studies,  there are multiple new sclerotic lesions throughout the thoracolumbar spine, largest at L2. The L2 lesion demonstrates uptake on today's bone scan. Again, these findings remain suspicious for blastic metastatic disease, possibly treated. 2. No evidence of pathologic fracture or epidural tumor. 3. Prominent retroperitoneal lymph nodes, also potentially indicating metastatic disease. 4. Relatively mild multilevel lumbar spondylosis with mild multifactorial spinal stenosis at L3-4. 5. Ectasia of the abdominal aorta.   Electronically Signed   By: Camie Patience M.D.   On: 04/24/2013 17:10   Mr Lumbar Spine Wo Contrast  04/24/2013   CLINICAL DATA:  Unexplained sclerotic lesions in the spine. History of prostate cancer with slowly rising serum PSA levels.  EXAM: MRI THORACIC AND LUMBAR SPINE WITHOUT CONTRAST  TECHNIQUE: Multiplanar and multiecho pulse sequences of the thoracic and lumbar spine were obtained without intravenous contrast.  COMPARISON:  NM BONE WHOLE BODY dated 04/24/2013; CT CHEST W/O CM dated 04/22/2013; CT ANGIO CHEST W/CM &/OR WO/CM dated 09/23/2010  FINDINGS: MR THORACIC SPINE FINDINGS  Sagittal localizing images of the cervical spine demonstrate no focal lesions or malalignment.  As demonstrated on the recent chest CT, there are several sclerotic lesions within the thoracic spine, demonstrating uniformly low T1 and T2 signal. There is no surrounding marrow edema. Lesions are noted at T2, T5, T7, T8, T9 and T10. There are hemangiomas at T6 and T10. There is no evidence of pathologic fracture or epidural tumor.  The thoracic cord is normal in signal and caliber. There is no disc herniation, spinal stenosis or nerve root encroachment.  Small bilateral pleural effusions and chronic lung disease are similar to the recent chest CT.  MR LUMBAR SPINE FINDINGS  The images through the lumbar spine are moderately motion degraded. There is a large sclerotic lesion nearly replacing the L2 vertebral body. This  demonstrates increased uptake on today's bone scan. There are additional smaller lesions throughout all of the other lumbar vertebral bodies and upper sacrum. There is no evidence of pathologic fracture or epidural tumor.  The lumbar spinal canal is relatively small on a congenital basis. The conus medullaris extends to the L1-2 disc space level and appears normal.  There are scattered prominent retroperitoneal lymph nodes, best seen on the sagittal images. There is ectasia of  the distal abdominal aorta which measures up to 3.5 cm in AP diameter.  L1-2: Mild disc bulging without significant resulting spinal stenosis or nerve root encroachment.  L2-3: Mild disc bulging. No significant spinal stenosis or nerve root encroachment.  L3-4: There is moderate disc bulging with mild facet and ligamentous hypertrophy. These factors contribute to mild spinal and lateral recess stenosis bilaterally. The foramina appear sufficiently patent.  L4-5: Mild disc bulging with mild facet and ligamentous hypertrophy. No significant spinal stenosis or nerve root encroachment.  L5-S1: There is chronic disc degeneration with annular disc bulging and paraspinal osteophytes. Mild facet degenerative changes are present bilaterally. There is mild narrowing of both foramina.  IMPRESSION: 1. As demonstrated on recent studies, there are multiple new sclerotic lesions throughout the thoracolumbar spine, largest at L2. The L2 lesion demonstrates uptake on today's bone scan. Again, these findings remain suspicious for blastic metastatic disease, possibly treated. 2. No evidence of pathologic fracture or epidural tumor. 3. Prominent retroperitoneal lymph nodes, also potentially indicating metastatic disease. 4. Relatively mild multilevel lumbar spondylosis with mild multifactorial spinal stenosis at L3-4. 5. Ectasia of the abdominal aorta.   Electronically Signed   By: Camie Patience M.D.   On: 04/24/2013 17:10   Nm Bone Scan Whole Body  04/24/2013    CLINICAL DATA:  History of prostate cancer with new sclerotic bone lesions on chest CT. Slowly rising PSA, 1.89 on 04/22/2013.  EXAM: NUCLEAR MEDICINE WHOLE BODY BONE SCAN  TECHNIQUE: Whole body anterior and posterior images were obtained approximately 3 hours after intravenous injection of radiopharmaceutical.  COMPARISON:  CT CHEST W/O CM dated 04/22/2013; CT ANGIO CHEST W/CM &/OR WO/CM dated 09/23/2010  RADIOPHARMACEUTICALS:  25.0 mCi Technetium-99 MDP  FINDINGS: There is lower right thoracic paraspinal activity, corresponding with paraspinal osteophytes on CT. I do not appreciate focally increased activity within the thoracic vertebral bodies to correspond with the sclerotic lesions on CT. There is diffusely increased activity within the L2 vertebral body, best seen on the posterior images. The spinal activity is otherwise unremarkable.  There is no abnormal activity within the ribs, pelvis or extremities.  IMPRESSION: 1. There is no definite abnormal activity within the thoracic spine to correspond with the new sclerotic lesions on CT. There is lower right paraspinal activity corresponding with osteophytes on CT. 2. There is prominent activity within the L2 vertebral body, not covered on the recent chest CT. This could reflect a metastasis. Correlation with lumbar spine radiographs is recommended. 3. Because the thoracic spine sclerotic lesions are new, they remain suspicious for blastic metastatic disease, possibly treated given the absence of bone scan activity.   Electronically Signed   By: Camie Patience M.D.   On: 04/24/2013 11:27    Scheduled Meds: . amiodarone  100 mg Oral q morning - 10a  . amLODipine  10 mg Oral Daily  . aspirin EC  81 mg Oral Daily  . atorvastatin  40 mg Oral q1800  . citalopram  10 mg Oral BID  . [START ON 04/26/2013] cloNIDine  0.1 mg Transdermal Weekly  . hydrALAZINE  100 mg Oral 3 times per day  . insulin aspart  0-9 Units Subcutaneous TID WC  . insulin detemir  50 Units  Subcutaneous BH-q7a  . iron polysaccharides  150 mg Oral Daily  . isosorbide mononitrate  60 mg Oral Daily  . levofloxacin (LEVAQUIN) IV  750 mg Intravenous Q48H  . levothyroxine  125 mcg Oral QAC breakfast  .  morphine injection  2  mg Intravenous Once  . pantoprazole  40 mg Oral BID  . potassium chloride SA  20 mEq Oral Daily  . sodium chloride  3 mL Intravenous Q12H  . torsemide  60 mg Oral BID   Continuous Infusions:   Principal Problem:   Anemia Active Problems:   HYPERTENSION   CHRONIC KIDNEY DISEASE STAGE III (MODERATE)   Acute on chronic diastolic heart failure   Normochromic normocytic anemia   Abnormal CXR   Hypertensive kidney disease with CKD stage III   Abnormal bone radiograph    Time spent: >35 minutes     Kinnie Feil  [facility] Pager [phone]. If 7PM-7AM, please contact night-coverage at [email or web address], password [facility] 04/25/2013, 9:49 AM  LOS: 5 days

## 2013-04-25 NOTE — Progress Notes (Signed)
Subjective: The patient is seen and examined today. He is feeling fine today with no specific complaints except for the swelling of the lower extremity. He denied having any significant chest pain, shortness of breath, cough or hemoptysis. The patient denied having any significant fever or chills, no nausea or vomiting.he had MRI of the thoracolumbar spine performed yesterday in addition to bone scan.  Objective: Vital signs in last 24 hours: Temp:  [97.5 F (36.4 C)-98 F (36.7 C)] 97.9 F (36.6 C) (03/07 0649) Pulse Rate:  [68-81] 68 (03/07 0649) Resp:  [16-18] 16 (03/07 0649) BP: (116-146)/(53-69) 132/63 mmHg (03/07 0649) SpO2:  [95 %-99 %] 95 % (03/07 0649)  Intake/Output from previous day: 03/06 0701 - 03/07 0700 In: 483 [P.O.:480; I.V.:3] Out: 3400 [Urine:3400] Intake/Output this shift: Total I/O In: 240 [P.O.:240] Out: -   General appearance: alert, cooperative, fatigued and no distress Resp: clear to auscultation bilaterally Cardio: regular rate and rhythm, S1, S2 normal, no murmur, click, rub or gallop GI: soft, non-tender; bowel sounds normal; no masses,  no organomegaly Extremities: edema 2+  Lab Results:   Recent Labs  04/24/13 0555 04/25/13 0530  WBC 8.9 7.8  HGB 8.2* 8.0*  HCT 25.7* 25.9*  PLT 242 254   BMET  Recent Labs  04/22/13 1801 04/23/13 0535  NA  --  138  K  --  4.1  CL  --  102  CO2  --  23  GLUCOSE  --  219*  BUN  --  67*  CREATININE 2.67* 2.66*  CALCIUM  --  8.2*    Studies/Results: Mr Thoracic Spine Wo Contrast  04/24/2013   CLINICAL DATA:  Unexplained sclerotic lesions in the spine. History of prostate cancer with slowly rising serum PSA levels.  EXAM: MRI THORACIC AND LUMBAR SPINE WITHOUT CONTRAST  TECHNIQUE: Multiplanar and multiecho pulse sequences of the thoracic and lumbar spine were obtained without intravenous contrast.  COMPARISON:  NM BONE WHOLE BODY dated 04/24/2013; CT CHEST W/O CM dated 04/22/2013; CT ANGIO CHEST W/CM &/OR  WO/CM dated 09/23/2010  FINDINGS: MR THORACIC SPINE FINDINGS  Sagittal localizing images of the cervical spine demonstrate no focal lesions or malalignment.  As demonstrated on the recent chest CT, there are several sclerotic lesions within the thoracic spine, demonstrating uniformly low T1 and T2 signal. There is no surrounding marrow edema. Lesions are noted at T2, T5, T7, T8, T9 and T10. There are hemangiomas at T6 and T10. There is no evidence of pathologic fracture or epidural tumor.  The thoracic cord is normal in signal and caliber. There is no disc herniation, spinal stenosis or nerve root encroachment.  Small bilateral pleural effusions and chronic lung disease are similar to the recent chest CT.  MR LUMBAR SPINE FINDINGS  The images through the lumbar spine are moderately motion degraded. There is a large sclerotic lesion nearly replacing the L2 vertebral body. This demonstrates increased uptake on today's bone scan. There are additional smaller lesions throughout all of the other lumbar vertebral bodies and upper sacrum. There is no evidence of pathologic fracture or epidural tumor.  The lumbar spinal canal is relatively small on a congenital basis. The conus medullaris extends to the L1-2 disc space level and appears normal.  There are scattered prominent retroperitoneal lymph nodes, best seen on the sagittal images. There is ectasia of the distal abdominal aorta which measures up to 3.5 cm in AP diameter.  L1-2: Mild disc bulging without significant resulting spinal stenosis or nerve root encroachment.  L2-3: Mild disc bulging. No significant spinal stenosis or nerve root encroachment.  L3-4: There is moderate disc bulging with mild facet and ligamentous hypertrophy. These factors contribute to mild spinal and lateral recess stenosis bilaterally. The foramina appear sufficiently patent.  L4-5: Mild disc bulging with mild facet and ligamentous hypertrophy. No significant spinal stenosis or nerve root  encroachment.  L5-S1: There is chronic disc degeneration with annular disc bulging and paraspinal osteophytes. Mild facet degenerative changes are present bilaterally. There is mild narrowing of both foramina.  IMPRESSION: 1. As demonstrated on recent studies, there are multiple new sclerotic lesions throughout the thoracolumbar spine, largest at L2. The L2 lesion demonstrates uptake on today's bone scan. Again, these findings remain suspicious for blastic metastatic disease, possibly treated. 2. No evidence of pathologic fracture or epidural tumor. 3. Prominent retroperitoneal lymph nodes, also potentially indicating metastatic disease. 4. Relatively mild multilevel lumbar spondylosis with mild multifactorial spinal stenosis at L3-4. 5. Ectasia of the abdominal aorta.   Electronically Signed   By: Camie Patience M.D.   On: 04/24/2013 17:10   Mr Lumbar Spine Wo Contrast  04/24/2013   CLINICAL DATA:  Unexplained sclerotic lesions in the spine. History of prostate cancer with slowly rising serum PSA levels.  EXAM: MRI THORACIC AND LUMBAR SPINE WITHOUT CONTRAST  TECHNIQUE: Multiplanar and multiecho pulse sequences of the thoracic and lumbar spine were obtained without intravenous contrast.  COMPARISON:  NM BONE WHOLE BODY dated 04/24/2013; CT CHEST W/O CM dated 04/22/2013; CT ANGIO CHEST W/CM &/OR WO/CM dated 09/23/2010  FINDINGS: MR THORACIC SPINE FINDINGS  Sagittal localizing images of the cervical spine demonstrate no focal lesions or malalignment.  As demonstrated on the recent chest CT, there are several sclerotic lesions within the thoracic spine, demonstrating uniformly low T1 and T2 signal. There is no surrounding marrow edema. Lesions are noted at T2, T5, T7, T8, T9 and T10. There are hemangiomas at T6 and T10. There is no evidence of pathologic fracture or epidural tumor.  The thoracic cord is normal in signal and caliber. There is no disc herniation, spinal stenosis or nerve root encroachment.  Small bilateral  pleural effusions and chronic lung disease are similar to the recent chest CT.  MR LUMBAR SPINE FINDINGS  The images through the lumbar spine are moderately motion degraded. There is a large sclerotic lesion nearly replacing the L2 vertebral body. This demonstrates increased uptake on today's bone scan. There are additional smaller lesions throughout all of the other lumbar vertebral bodies and upper sacrum. There is no evidence of pathologic fracture or epidural tumor.  The lumbar spinal canal is relatively small on a congenital basis. The conus medullaris extends to the L1-2 disc space level and appears normal.  There are scattered prominent retroperitoneal lymph nodes, best seen on the sagittal images. There is ectasia of the distal abdominal aorta which measures up to 3.5 cm in AP diameter.  L1-2: Mild disc bulging without significant resulting spinal stenosis or nerve root encroachment.  L2-3: Mild disc bulging. No significant spinal stenosis or nerve root encroachment.  L3-4: There is moderate disc bulging with mild facet and ligamentous hypertrophy. These factors contribute to mild spinal and lateral recess stenosis bilaterally. The foramina appear sufficiently patent.  L4-5: Mild disc bulging with mild facet and ligamentous hypertrophy. No significant spinal stenosis or nerve root encroachment.  L5-S1: There is chronic disc degeneration with annular disc bulging and paraspinal osteophytes. Mild facet degenerative changes are present bilaterally. There is mild narrowing of both  foramina.  IMPRESSION: 1. As demonstrated on recent studies, there are multiple new sclerotic lesions throughout the thoracolumbar spine, largest at L2. The L2 lesion demonstrates uptake on today's bone scan. Again, these findings remain suspicious for blastic metastatic disease, possibly treated. 2. No evidence of pathologic fracture or epidural tumor. 3. Prominent retroperitoneal lymph nodes, also potentially indicating metastatic  disease. 4. Relatively mild multilevel lumbar spondylosis with mild multifactorial spinal stenosis at L3-4. 5. Ectasia of the abdominal aorta.   Electronically Signed   By: Camie Patience M.D.   On: 04/24/2013 17:10   Nm Bone Scan Whole Body  04/24/2013   CLINICAL DATA:  History of prostate cancer with new sclerotic bone lesions on chest CT. Slowly rising PSA, 1.89 on 04/22/2013.  EXAM: NUCLEAR MEDICINE WHOLE BODY BONE SCAN  TECHNIQUE: Whole body anterior and posterior images were obtained approximately 3 hours after intravenous injection of radiopharmaceutical.  COMPARISON:  CT CHEST W/O CM dated 04/22/2013; CT ANGIO CHEST W/CM &/OR WO/CM dated 09/23/2010  RADIOPHARMACEUTICALS:  25.0 mCi Technetium-99 MDP  FINDINGS: There is lower right thoracic paraspinal activity, corresponding with paraspinal osteophytes on CT. I do not appreciate focally increased activity within the thoracic vertebral bodies to correspond with the sclerotic lesions on CT. There is diffusely increased activity within the L2 vertebral body, best seen on the posterior images. The spinal activity is otherwise unremarkable.  There is no abnormal activity within the ribs, pelvis or extremities.  IMPRESSION: 1. There is no definite abnormal activity within the thoracic spine to correspond with the new sclerotic lesions on CT. There is lower right paraspinal activity corresponding with osteophytes on CT. 2. There is prominent activity within the L2 vertebral body, not covered on the recent chest CT. This could reflect a metastasis. Correlation with lumbar spine radiographs is recommended. 3. Because the thoracic spine sclerotic lesions are new, they remain suspicious for blastic metastatic disease, possibly treated given the absence of bone scan activity.   Electronically Signed   By: Camie Patience M.D.   On: 04/24/2013 11:27    Medications: I have reviewed the patient's current medications.   Assessment/Plan: 1) sclerotic bone lesions: likely to be  related to his history of prostate adenocarcinoma but other etiology cannot be excluded at this point. I think the patient may benefit from biopsy of one of the sclerotic bone lesion especially L2 lesion to confirm a diagnosis. 2) persistent anemia: multifactorial including anemia of chronic disease versus bone marrow involvement with malignancy. He was started on oral iron. Dr. Beryle Beams would see the patient in Monday for further recommendation regarding his condition.    LOS: 5 days    Cadyn Fann K. 04/25/2013

## 2013-04-26 LAB — GLUCOSE, CAPILLARY
GLUCOSE-CAPILLARY: 165 mg/dL — AB (ref 70–99)
GLUCOSE-CAPILLARY: 239 mg/dL — AB (ref 70–99)
Glucose-Capillary: 197 mg/dL — ABNORMAL HIGH (ref 70–99)
Glucose-Capillary: 200 mg/dL — ABNORMAL HIGH (ref 70–99)

## 2013-04-26 LAB — BASIC METABOLIC PANEL
BUN: 58 mg/dL — ABNORMAL HIGH (ref 6–23)
CO2: 23 mEq/L (ref 19–32)
Calcium: 8.5 mg/dL (ref 8.4–10.5)
Chloride: 99 mEq/L (ref 96–112)
Creatinine, Ser: 2.93 mg/dL — ABNORMAL HIGH (ref 0.50–1.35)
GFR calc non Af Amer: 19 mL/min — ABNORMAL LOW (ref >90)
GFR, EST AFRICAN AMERICAN: 22 mL/min — AB (ref >90)
Glucose, Bld: 246 mg/dL — ABNORMAL HIGH (ref 70–99)
Potassium: 3.8 mEq/L (ref 3.7–5.3)
Sodium: 136 mEq/L — ABNORMAL LOW (ref 137–147)

## 2013-04-26 LAB — CBC
HCT: 27.7 % — ABNORMAL LOW (ref 39.0–52.0)
Hemoglobin: 8.5 g/dL — ABNORMAL LOW (ref 13.0–17.0)
MCH: 27.5 pg (ref 26.0–34.0)
MCHC: 30.7 g/dL (ref 30.0–36.0)
MCV: 89.6 fL (ref 78.0–100.0)
Platelets: 281 10*3/uL (ref 150–400)
RBC: 3.09 MIL/uL — ABNORMAL LOW (ref 4.22–5.81)
RDW: 16.9 % — AB (ref 11.5–15.5)
WBC: 7.5 10*3/uL (ref 4.0–10.5)

## 2013-04-26 MED ORDER — LEVOFLOXACIN 750 MG PO TABS
750.0000 mg | ORAL_TABLET | ORAL | Status: DC
  Administered 2013-04-27: 750 mg via ORAL
  Filled 2013-04-26: qty 1

## 2013-04-26 MED ORDER — TORSEMIDE 20 MG PO TABS
60.0000 mg | ORAL_TABLET | Freq: Every day | ORAL | Status: DC
  Administered 2013-04-27: 60 mg via ORAL
  Filled 2013-04-26: qty 3

## 2013-04-26 NOTE — Progress Notes (Signed)
ANTIBIOTIC CONSULT NOTE - FOLLOW UP  Pharmacy Consult for Levaquin Indication: CAP  Allergies  Allergen Reactions  . Ace Inhibitors Other (See Comments)    me  hyposion  . Beta Adrenergic Blockers Other (See Comments)     use cautiously secondary to 2nd degree heart block, hypotension    Patient Measurements: Height: 5\' 10"  (177.8 cm) Weight: 241 lb (109.317 kg) IBW/kg (Calculated) : 73   Vital Signs: Temp: 97.7 F (36.5 C) (03/08 0541) Temp src: Oral (03/08 0541) BP: 136/58 mmHg (03/08 0943) Pulse Rate: 69 (03/08 0541) Intake/Output from previous day: 03/07 0701 - 03/08 0700 In: 600 [P.O.:600] Out: 4225 [Urine:4225] Intake/Output from this shift:    Labs:  Recent Labs  04/24/13 0555 04/25/13 0530 04/26/13 0545 04/26/13 1004  WBC 8.9 7.8  --  7.5  HGB 8.2* 8.0*  --  8.5*  PLT 242 254  --  281  CREATININE  --   --  2.93*  --    Estimated Creatinine Clearance: 25.7 ml/min (by C-G formula based on Cr of 2.93).    Microbiology: No results found for this or any previous visit (from the past 720 hour(s)).  Anti-infectives   Start     Dose/Rate Route Frequency Ordered Stop   04/21/13 1000  levofloxacin (LEVAQUIN) IVPB 750 mg  Status:  Discontinued     750 mg 100 mL/hr over 90 Minutes Intravenous Every 24 hours 04/20/13 1804 04/20/13 1805   04/21/13 1000  levofloxacin (LEVAQUIN) IVPB 750 mg     750 mg 100 mL/hr over 90 Minutes Intravenous Every 48 hours 04/20/13 1805     04/20/13 1430  cefTRIAXone (ROCEPHIN) 1 g in dextrose 5 % 50 mL IVPB     1 g 100 mL/hr over 30 Minutes Intravenous  Once 04/20/13 1425 04/22/13 2146   04/20/13 1430  azithromycin (ZITHROMAX) tablet 500 mg     500 mg Oral  Once 04/20/13 1425 04/20/13 1605      Assessment: 80 yo male presents to ED on 3/2 with shortness of breath and Hgb of 4.8. Empiric Levaquin for CAP with pharmacy dosing assistance. Dr. Beryle Beams was consulted for workup of the anemia.  3/2 >> Rocephin/Zithro x1 3/3  >> Levaquin >>  Tmax: remains AF WBC: wnl CKD3: SCr rose to 2.93(slightly above baseline) with CrCl 26CG  No cultures  Goal of Therapy:  Appropriate antibiotic dosing for renal function; eradication of infection  Plan:   Day 8 total abx. Day 6 Levaquin 750mg  IV q48h for CAP.  Meets criteria for auto-switch to PO route.  What is the planned duration?  Will cont to follow daily.  Romeo Rabon, PharmD, pager [phone]. 04/26/2013,10:40 AM.

## 2013-04-26 NOTE — Progress Notes (Addendum)
[facility] PROGRESS NOTE  [name] [id number] DOB: 10-10-1932 DOA: 04/20/2013 PCP: [name], MD  Assessment/Plan: 80 year old man withHTN, CAD, CHF, IDDM, CKD, PAF, CKD with currently recorded creatinine of 2.3 presented to the emergency department on 04/20/2013 complaining of generalized weakness and dyspnea  found to be profoundly anemic with a hemoglobin of 4.8 found to have metastatic bone lesions   1. Anemia multifactorial: IDA+likely worse with blood loss; + hem occults+hematuria +? MM  -s/p upper GI endoscopy which showed benign gastric polyps. -2 units of blood transfusion on admission; TFsed 2 units 3/4; monitor  -no signs of hemolysis   2. Metastatic bone lesions; h/o prostate CA s/p prostatectomy;  -likely MM (+light chains) +vs metastatic lesions -CT/MRI: Multiple new round sclerotic lesions in thoracolumbar spine, largest at L2. most  Compatible with blastic osseous metastatic disease; -Bone scan: There is no definite abnormal activity within the thoracic spine to correspond with the new sclerotic lesions on CT. There is lower right paraspinal activity corresponding with osteophytes on CT, but L 2 level;  -PSA WNL;  appreciate hem/onc input; evaluate for IR biopsy requested by hem/onc     2. Hematuria hematuria last night and urology was consulted.  -s/p cystoscopy which showed tear in the urethra. Patient has a Foley catheter in place, which needs to stay there for a week.  -follow the urology in one week for a voiding trial/Foley removal   3. Community acquired pneumonia; chest x-ray right hilar contour and middle lobe opacity - on Levaquin, improving   4. GI bleed history of hemorrhoids as well as gastritis. He had both endoscopy and colonoscopy 5 years ago.  -GI performed EGD, no plans for colonoscopy. Recommendation for low-dose PPI 20 mg of daily   5. Congestive heart failure echo (2013): LVEF [id number], grade 2 diastolic dysfunction -continue Demadex,  decreased the dose 3/8 to 60 BID-->daily due to mild worsening renal function    6. Diabetes mellitus; HA1C-7.4 (07/2012) -Continue sliding scale insulin; d/c metformin not candidate due to CKD  7. Hypertension Continue home regimen including hydralazine, Catapres patch.   8. History of CAD Continue Imdur, aspirin.   9. PAF not on AC due to GIB, bleeding, fall risk; cont amiodarone; not on  BB;   10. CKD III; not oliguric; cont diuretics; need outpatient nephrology monitor   DVT prophylaxis  SCDs  D/w patient, he is DNR; confirmed with her daughter   Code Status: DNR  Family Communication: d/w patient, [name] Daughter [phone]  (indicate person spoken with, relationship, and if by phone, the number) Disposition Plan: pend preauthorization   Consultants:  Hem onc,  Consultants:  GI  Hematology Procedures:  EGD- showed benign-appearing gastric polyps  Cystoscopy- urethral tear   Antibiotics:  Levofloxacin 3/3<<<< (indicate start date, and stop date if known)  HPI/Subjective: alert  Objective: Filed Vitals:   04/26/13 0541  BP: 159/61  Pulse: 69  Temp: 97.7 F (36.5 C)  Resp: 18    Intake/Output Summary (Last 24 hours) at 04/26/13 0933 Last data filed at 04/26/13 0654  Gross per 24 hour  Intake    360 ml  Output   2625 ml  Net  -2265 ml   Filed Weights   04/20/13 1652 04/21/13 1137  Weight: 109.5 kg (241 lb 6.5 oz) 109.317 kg (241 lb)    Exam:   General:  alert  Cardiovascular: s1,s2 rrr  Respiratory: few crackles in LL  Abdomen: soft, distended, nt  Musculoskeletal: no edema  Data Reviewed: Basic Metabolic Panel:  Recent Labs Lab 04/20/13 1116 04/21/13 0040 04/21/13 2255 04/22/13 1801 04/23/13 0535 04/26/13 0545  NA 138 139  --   --  138 136*  K 4.3 4.2  --   --  4.1 3.8  CL 103 103  --   --  102 99  CO2 22 24  --   --  23 23  GLUCOSE 266* 130*  --   --  219* 246*  BUN 69* 61*  --   --   67* 58*  CREATININE 2.32* 2.30*  --  2.67* 2.66* 2.93*  CALCIUM 9.1 8.6  --   --  8.2* 8.5  MG  --   --  2.3  --   --   --    Liver Function Tests:  Recent Labs Lab 04/20/13 1116 04/21/13 0040 04/21/13 1405  AST 15 16  --   ALT 13 13  --   ALKPHOS 60 55  --   BILITOT <0.2* 0.3  --   PROT 6.1 5.6* 5.6*  ALBUMIN 2.9* 2.7*  --    No results found for this basename: LIPASE, AMYLASE,  in the last 168 hours No results found for this basename: AMMONIA,  in the last 168 hours CBC:  Recent Labs Lab 04/20/13 1116 04/20/13 2034 04/21/13 0040  04/22/13 0820 04/22/13 2335 04/23/13 0535 04/24/13 0555 04/25/13 0530  WBC 12.4* 14.6* 15.6*  --   --   --  10.1 8.9 7.8  NEUTROABS 10.7*  --   --   --   --   --   --   --  6.1  HGB 4.8* 5.7* 6.3*  < > 6.5* 7.9* 8.5* 8.2* 8.0*  HCT 16.1* 18.7* 20.4*  < > 21.1* 25.2* 26.1* 25.7* 25.9*  MCV 91.0 90.8 89.1  --   --   --  90.0 88.9 90.2  PLT 276 289 258  --   --   --  217 242 254  < > = values in this interval not displayed. Cardiac Enzymes: No results found for this basename: CKTOTAL, CKMB, CKMBINDEX, TROPONINI,  in the last 168 hours BNP (last 3 results)  Recent Labs  04/03/13 1155 04/20/13 1116  PROBNP 893.8* 1139.0*   CBG:  Recent Labs Lab 04/25/13 0736 04/25/13 1205 04/25/13 1610 04/25/13 2111 04/26/13 0628  GLUCAP 148* 203* 260* 150* 197*    No results found for this or any previous visit (from the past 240 hour(s)).   Studies: Mr Thoracic Spine Wo Contrast  04/24/2013   CLINICAL DATA:  Unexplained sclerotic lesions in the spine. History of prostate cancer with slowly rising serum PSA levels.  EXAM: MRI THORACIC AND LUMBAR SPINE WITHOUT CONTRAST  TECHNIQUE: Multiplanar and multiecho pulse sequences of the thoracic and lumbar spine were obtained without intravenous contrast.  COMPARISON:  NM BONE WHOLE BODY dated 04/24/2013; CT CHEST W/O CM dated 04/22/2013; CT ANGIO CHEST W/CM &/OR WO/CM dated 09/23/2010  FINDINGS: MR THORACIC  SPINE FINDINGS  Sagittal localizing images of the cervical spine demonstrate no focal lesions or malalignment.  As demonstrated on the recent chest CT, there are several sclerotic lesions within the thoracic spine, demonstrating uniformly low T1 and T2 signal. There is no surrounding marrow edema. Lesions are noted at T2, T5, T7, T8, T9 and T10. There are hemangiomas at T6 and T10. There is no evidence of pathologic fracture or epidural tumor.  The thoracic cord is normal in signal and caliber. There is no  disc herniation, spinal stenosis or nerve root encroachment.  Small bilateral pleural effusions and chronic lung disease are similar to the recent chest CT.  MR LUMBAR SPINE FINDINGS  The images through the lumbar spine are moderately motion degraded. There is a large sclerotic lesion nearly replacing the L2 vertebral body. This demonstrates increased uptake on today's bone scan. There are additional smaller lesions throughout all of the other lumbar vertebral bodies and upper sacrum. There is no evidence of pathologic fracture or epidural tumor.  The lumbar spinal canal is relatively small on a congenital basis. The conus medullaris extends to the L1-2 disc space level and appears normal.  There are scattered prominent retroperitoneal lymph nodes, best seen on the sagittal images. There is ectasia of the distal abdominal aorta which measures up to 3.5 cm in AP diameter.  L1-2: Mild disc bulging without significant resulting spinal stenosis or nerve root encroachment.  L2-3: Mild disc bulging. No significant spinal stenosis or nerve root encroachment.  L3-4: There is moderate disc bulging with mild facet and ligamentous hypertrophy. These factors contribute to mild spinal and lateral recess stenosis bilaterally. The foramina appear sufficiently patent.  L4-5: Mild disc bulging with mild facet and ligamentous hypertrophy. No significant spinal stenosis or nerve root encroachment.  L5-S1: There is chronic disc  degeneration with annular disc bulging and paraspinal osteophytes. Mild facet degenerative changes are present bilaterally. There is mild narrowing of both foramina.  IMPRESSION: 1. As demonstrated on recent studies, there are multiple new sclerotic lesions throughout the thoracolumbar spine, largest at L2. The L2 lesion demonstrates uptake on today's bone scan. Again, these findings remain suspicious for blastic metastatic disease, possibly treated. 2. No evidence of pathologic fracture or epidural tumor. 3. Prominent retroperitoneal lymph nodes, also potentially indicating metastatic disease. 4. Relatively mild multilevel lumbar spondylosis with mild multifactorial spinal stenosis at L3-4. 5. Ectasia of the abdominal aorta.   Electronically Signed   By: Camie Patience M.D.   On: 04/24/2013 17:10   Mr Lumbar Spine Wo Contrast  04/24/2013   CLINICAL DATA:  Unexplained sclerotic lesions in the spine. History of prostate cancer with slowly rising serum PSA levels.  EXAM: MRI THORACIC AND LUMBAR SPINE WITHOUT CONTRAST  TECHNIQUE: Multiplanar and multiecho pulse sequences of the thoracic and lumbar spine were obtained without intravenous contrast.  COMPARISON:  NM BONE WHOLE BODY dated 04/24/2013; CT CHEST W/O CM dated 04/22/2013; CT ANGIO CHEST W/CM &/OR WO/CM dated 09/23/2010  FINDINGS: MR THORACIC SPINE FINDINGS  Sagittal localizing images of the cervical spine demonstrate no focal lesions or malalignment.  As demonstrated on the recent chest CT, there are several sclerotic lesions within the thoracic spine, demonstrating uniformly low T1 and T2 signal. There is no surrounding marrow edema. Lesions are noted at T2, T5, T7, T8, T9 and T10. There are hemangiomas at T6 and T10. There is no evidence of pathologic fracture or epidural tumor.  The thoracic cord is normal in signal and caliber. There is no disc herniation, spinal stenosis or nerve root encroachment.  Small bilateral pleural effusions and chronic lung disease are  similar to the recent chest CT.  MR LUMBAR SPINE FINDINGS  The images through the lumbar spine are moderately motion degraded. There is a large sclerotic lesion nearly replacing the L2 vertebral body. This demonstrates increased uptake on today's bone scan. There are additional smaller lesions throughout all of the other lumbar vertebral bodies and upper sacrum. There is no evidence of pathologic fracture or epidural tumor.  The lumbar spinal canal is relatively small on a congenital basis. The conus medullaris extends to the L1-2 disc space level and appears normal.  There are scattered prominent retroperitoneal lymph nodes, best seen on the sagittal images. There is ectasia of the distal abdominal aorta which measures up to 3.5 cm in AP diameter.  L1-2: Mild disc bulging without significant resulting spinal stenosis or nerve root encroachment.  L2-3: Mild disc bulging. No significant spinal stenosis or nerve root encroachment.  L3-4: There is moderate disc bulging with mild facet and ligamentous hypertrophy. These factors contribute to mild spinal and lateral recess stenosis bilaterally. The foramina appear sufficiently patent.  L4-5: Mild disc bulging with mild facet and ligamentous hypertrophy. No significant spinal stenosis or nerve root encroachment.  L5-S1: There is chronic disc degeneration with annular disc bulging and paraspinal osteophytes. Mild facet degenerative changes are present bilaterally. There is mild narrowing of both foramina.  IMPRESSION: 1. As demonstrated on recent studies, there are multiple new sclerotic lesions throughout the thoracolumbar spine, largest at L2. The L2 lesion demonstrates uptake on today's bone scan. Again, these findings remain suspicious for blastic metastatic disease, possibly treated. 2. No evidence of pathologic fracture or epidural tumor. 3. Prominent retroperitoneal lymph nodes, also potentially indicating metastatic disease. 4. Relatively mild multilevel lumbar  spondylosis with mild multifactorial spinal stenosis at L3-4. 5. Ectasia of the abdominal aorta.   Electronically Signed   By: Roxy HorsemanBill  Veazey M.D.   On: 04/24/2013 17:10   Nm Bone Scan Whole Body  04/24/2013   CLINICAL DATA:  History of prostate cancer with new sclerotic bone lesions on chest CT. Slowly rising PSA, 1.89 on 04/22/2013.  EXAM: NUCLEAR MEDICINE WHOLE BODY BONE SCAN  TECHNIQUE: Whole body anterior and posterior images were obtained approximately 3 hours after intravenous injection of radiopharmaceutical.  COMPARISON:  CT CHEST W/O CM dated 04/22/2013; CT ANGIO CHEST W/CM &/OR WO/CM dated 09/23/2010  RADIOPHARMACEUTICALS:  25.0 mCi Technetium-99 MDP  FINDINGS: There is lower right thoracic paraspinal activity, corresponding with paraspinal osteophytes on CT. I do not appreciate focally increased activity within the thoracic vertebral bodies to correspond with the sclerotic lesions on CT. There is diffusely increased activity within the L2 vertebral body, best seen on the posterior images. The spinal activity is otherwise unremarkable.  There is no abnormal activity within the ribs, pelvis or extremities.  IMPRESSION: 1. There is no definite abnormal activity within the thoracic spine to correspond with the new sclerotic lesions on CT. There is lower right paraspinal activity corresponding with osteophytes on CT. 2. There is prominent activity within the L2 vertebral body, not covered on the recent chest CT. This could reflect a metastasis. Correlation with lumbar spine radiographs is recommended. 3. Because the thoracic spine sclerotic lesions are new, they remain suspicious for blastic metastatic disease, possibly treated given the absence of bone scan activity.   Electronically Signed   By: Roxy HorsemanBill  Veazey M.D.   On: 04/24/2013 11:27    Scheduled Meds: . amiodarone  100 mg Oral q morning - 10a  . amLODipine  10 mg Oral Daily  . aspirin EC  81 mg Oral Daily  . atorvastatin  40 mg Oral q1800  .  citalopram  10 mg Oral BID  . cloNIDine  0.1 mg Transdermal Weekly  . hydrALAZINE  100 mg Oral 3 times per day  . insulin aspart  0-9 Units Subcutaneous TID WC  . insulin detemir  50 Units Subcutaneous BH-q7a  . iron polysaccharides  150 mg Oral Daily  . isosorbide mononitrate  60 mg Oral Daily  . levofloxacin (LEVAQUIN) IV  750 mg Intravenous Q48H  . levothyroxine  125 mcg Oral QAC breakfast  .  morphine injection  2 mg Intravenous Once  . pantoprazole  40 mg Oral BID  . potassium chloride SA  20 mEq Oral Daily  . sodium chloride  3 mL Intravenous Q12H  . torsemide  60 mg Oral BID   Continuous Infusions:   Principal Problem:   Anemia Active Problems:   HYPERTENSION   CHRONIC KIDNEY DISEASE STAGE III (MODERATE)   Acute on chronic diastolic heart failure   Normochromic normocytic anemia   Abnormal CXR   Hypertensive kidney disease with CKD stage III   Abnormal bone radiograph    Time spent: >35 minutes     Kinnie Feil  [facility] Pager [phone]. If 7PM-7AM, please contact night-coverage at [email or web address], password [facility] 04/26/2013, 9:33 AM  LOS: 6 days

## 2013-04-27 ENCOUNTER — Inpatient Hospital Stay (HOSPITAL_COMMUNITY): Payer: Medicare PPO

## 2013-04-27 DIAGNOSIS — R809 Proteinuria, unspecified: Secondary | ICD-10-CM

## 2013-04-27 LAB — CBC WITH DIFFERENTIAL/PLATELET
BASOS PCT: 0 % (ref 0–1)
Basophils Absolute: 0 10*3/uL (ref 0.0–0.1)
EOS PCT: 3 % (ref 0–5)
Eosinophils Absolute: 0.3 10*3/uL (ref 0.0–0.7)
HCT: 28.9 % — ABNORMAL LOW (ref 39.0–52.0)
Hemoglobin: 9 g/dL — ABNORMAL LOW (ref 13.0–17.0)
Lymphocytes Relative: 8 % — ABNORMAL LOW (ref 12–46)
Lymphs Abs: 0.8 10*3/uL (ref 0.7–4.0)
MCH: 27.6 pg (ref 26.0–34.0)
MCHC: 31.1 g/dL (ref 30.0–36.0)
MCV: 88.7 fL (ref 78.0–100.0)
MONO ABS: 0.9 10*3/uL (ref 0.1–1.0)
MONOS PCT: 9 % (ref 3–12)
NEUTROS PCT: 80 % — AB (ref 43–77)
Neutro Abs: 7.8 10*3/uL — ABNORMAL HIGH (ref 1.7–7.7)
Platelets: 316 10*3/uL (ref 150–400)
RBC: 3.26 MIL/uL — AB (ref 4.22–5.81)
RDW: 16.8 % — ABNORMAL HIGH (ref 11.5–15.5)
WBC: 9.8 10*3/uL (ref 4.0–10.5)

## 2013-04-27 LAB — GLUCOSE, CAPILLARY
GLUCOSE-CAPILLARY: 220 mg/dL — AB (ref 70–99)
GLUCOSE-CAPILLARY: 119 mg/dL — AB (ref 70–99)
Glucose-Capillary: 146 mg/dL — ABNORMAL HIGH (ref 70–99)
Glucose-Capillary: 135 mg/dL — ABNORMAL HIGH (ref 70–99)
Glucose-Capillary: 210 mg/dL — ABNORMAL HIGH (ref 70–99)

## 2013-04-27 LAB — RETICULOCYTES
RBC.: 3.26 MIL/uL — AB (ref 4.22–5.81)
RETIC COUNT ABSOLUTE: 218.4 10*3/uL — AB (ref 19.0–186.0)
RETIC CT PCT: 6.7 % — AB (ref 0.4–3.1)

## 2013-04-27 MED ORDER — TORSEMIDE 20 MG PO TABS
60.0000 mg | ORAL_TABLET | Freq: Two times a day (BID) | ORAL | Status: DC
  Administered 2013-04-27 – 2013-04-28 (×2): 60 mg via ORAL
  Filled 2013-04-27 (×4): qty 3

## 2013-04-27 MED ORDER — FENTANYL CITRATE 0.05 MG/ML IJ SOLN
INTRAMUSCULAR | Status: AC | PRN
  Administered 2013-04-27: 50 ug via INTRAVENOUS

## 2013-04-27 MED ORDER — MIDAZOLAM HCL 2 MG/2ML IJ SOLN
INTRAMUSCULAR | Status: AC
  Filled 2013-04-27: qty 4

## 2013-04-27 MED ORDER — FENTANYL CITRATE 0.05 MG/ML IJ SOLN
INTRAMUSCULAR | Status: AC
  Filled 2013-04-27: qty 4

## 2013-04-27 MED ORDER — MIDAZOLAM HCL 2 MG/2ML IJ SOLN
INTRAMUSCULAR | Status: AC | PRN
  Administered 2013-04-27: 1 mg via INTRAVENOUS

## 2013-04-27 NOTE — Procedures (Signed)
CT guided core biopsies of L2 vertebral body.   No immediate complication.

## 2013-04-27 NOTE — Progress Notes (Signed)
Occupational Therapy Treatment Patient Details Name: ZYEN TRIGGS MRN: [id number] DOB: [birth year] Today's Date: 04/27/2013 Time: 3785-8850 OT Time Calculation (min): 24 min  OT Assessment / Plan / Recommendation  History of present illness 80 yo male admitted with anemia, fall. Hx of HTN, DM, CHF, Afib, CVA. Pt lives alone;   OT comments  Pt sad this OT visit regarding situation  Follow Up Recommendations  SNF             Frequency Min 2X/week      Plan Discharge plan remains appropriate    Precautions / Restrictions Precautions Precautions: Fall       ADL  Where Assessed - Grooming: Unsupported sitting Upper Body Bathing: Minimal assistance Where Assessed - Upper Body Bathing: Unsupported sitting Lower Body Bathing: Moderate assistance Where Assessed - Lower Body Bathing: Supported sit to stand Where Assessed - Upper Body Dressing: Supported sit to stand Lower Body Dressing: Moderate assistance Where Assessed - Lower Body Dressing: Supported sit to Lobbyist: Magazine features editor Method: Sit to Loss adjuster, chartered: Other (comment) (urinal) Toileting - Clothing Manipulation and Hygiene: Min guard Where Assessed - [place] and Hygiene: Standing ADL Comments: Pt seems sad about situation- but states "pray for me" when OT ask if he needed anything    O  OT Goals(current goals can now be found in the care plan section) Acute Rehab OT Goals Patient Stated Goal: to get better and d/c to daughter's home  Visit Information  Last OT Received On: 04/27/13 Assistance Needed: +1 History of Present Illness: 80 yo male admitted with anemia, fall. Hx of HTN, DM, CHF, Afib, CVA. Pt lives alone;          Cognition  Cognition Arousal/Alertness: Awake/alert Behavior During Therapy: WFL for tasks assessed/performed Overall Cognitive Status: Within Functional Limits for tasks assessed    Mobility  Transfers Overall  transfer level: Needs assistance Equipment used: Rolling walker (2 wheeled) Transfers: Sit to/from Stand Sit to Stand: Min guard;Min assist General transfer comment: verbal cues for safe hand placement and control of descent. Pt did not 'plop' this OT visit    Exercises  General Exercises - Lower Extremity Ankle Circles/Pumps: AROM;Both;10 reps Quad Sets: AROM;Both;5 reps      End of Session OT - End of Session Activity Tolerance: Patient tolerated treatment well Patient left: in chair;with call bell/phone within reach Nurse Communication: Mobility status  GO     Betsy Pries 04/27/2013, 12:31 PM

## 2013-04-27 NOTE — Progress Notes (Signed)
Patient ID: [name], male   DOB: April 11, 1932, 80 y.o.   MRN: [id number] Request received for CT guided L2 lesion biopsy in pt with prior history of prostate carcinoma  2009 and numerous sclerotic bony lesions of thoracic/lumbar spine/prominent RP lymph nodes noted on recent imaging studies. Pt admitted with dyspnea, anemia,  hematuria, and weakness. Additional PMH as below. Images were reviewed by Dr. Anselm Pancoast. Exam: pt awake/answers questions appropriately; chest- dim BS bases; heart- sinus with occ ectopy; abd- protuberant,+BS,NT; ext- 1-2+ bilat LE edema.    Filed Vitals:   04/26/13 1438 04/26/13 2213 04/27/13 0520 04/27/13 1000  BP: 128/63 133/51 143/54 147/61  Pulse: 73 78 66 72  Temp: 98 F (36.7 C) 97.6 F (36.4 C) 97.5 F (36.4 C)   TempSrc: Oral Oral Oral   Resp: 20 20 20    Height:      Weight:      SpO2: 98% 99% 97%    Past Medical History  Diagnosis Date  . Chronic diastolic heart failure     secondary to diastolic dysfunction EF (previous EF 35-45%)ef 60%4/09  . Arrhythmia     atrial fibrillation /pt on amiodarone,thought to be poor coumadin  . Stroke   . Other and unspecified hyperlipidemia   . Hypertension   . Hypertrophy of prostate without urinary obstruction and other lower urinary tract symptoms (LUTS)   . Osteoarthrosis, unspecified whether generalized or localized, unspecified site   . Type II or unspecified type diabetes mellitus without mention of complication, not stated as uncontrolled   . AV block, Mobitz 1     Intolerance to ACE's / discontiuation of beta blockers  . Obstructive sleep apnea   . Iron deficiency anemia, unspecified     s/p EGD and coloscopy 3/10.gastritis.hemorrhids  . Cerebrovascular accident   . Renal insufficiency   . Obesity   . Allergic rhinitis, cause unspecified 05/29/2010  . CAD (coronary artery disease)     cath 12/04: mLAD 50-60%, mCFX 20-30%, pRCA 50-60%, mRCA 40%  . Normochromic normocytic anemia 04/21/2013  . Abnormal CXR  04/21/2013  . Hypertensive kidney disease with CKD stage III 04/21/2013  . Prostate cancer    Past Surgical History  Procedure Laterality Date  . Prostate surgery    . Prostate surgury      hx of  . Esophagogastroduodenoscopy N/A 04/21/2013    Procedure: ESOPHAGOGASTRODUODENOSCOPY (EGD);  Surgeon: Irene Shipper, MD;  Location: [facility];  Service: Endoscopy;  Laterality: N/A;  Dg Chest 2 View  04/20/2013   CLINICAL DATA:  Shortness of breath, weakness, history of atrial fibrillation, history of prostate can't  EXAM: CHEST  2 VIEW  COMPARISON:  DG CHEST 2 VIEW dated 06/05/2012; CT ANGIO CHEST W/CM &/OR WO/CM dated 09/23/2010  FINDINGS: Mild to moderate cardiac enlargement. Mild vascular congestion. Abnormal opacity over the right perihilar area extending laterally into the right middle lobe. There is a somewhat nodular component to the airspace opacity in the lateral right mid lung zone. There was density in this area previously, but the current opacity appears more pronounced.  IMPRESSION: Abnormal right hilar contour and middle lobe opacity. Adenopathy, mass, and pneumonia are all considerations. It is possible that this represents a progressive chronic fibrotic process. Consider evaluation contrast-enhanced CT.   Electronically Signed   By: Skipper Cliche M.D.   On: 04/20/2013 11:25   Ct Chest Wo Contrast  04/22/2013   ADDENDUM REPORT: 04/22/2013 11:35  ADDENDUM: Study discussed by telephone with Dr. Murriel Hopper on  04/22/2013 at 11:35 .   Electronically Signed   By: Lars Pinks M.D.   On: 04/22/2013 11:35   04/22/2013   CLINICAL DATA:  80 year old male with shortness of breath. Weakness. Recent abnormal chest x-ray. Initial encounter.  EXAM: CT CHEST WITHOUT CONTRAST  TECHNIQUE: Multidetector CT imaging of the chest was performed following the standard protocol without IV contrast.  COMPARISON:  Chest radiographs 04/20/2013 and earlier. Chest CTA 09/23/2010. Chest CT 09/22/2010.  FINDINGS: Chronic lung  disease with emphysema and peripheral reticular opacity suggesting a degree of fibrosis. There is combined fibrosis and bronchiectasis suspected in the superior segment of the right lower lobe.  Superimposed small bilateral layering pleural effusions, slightly larger on the right. Associated dependent compressive atelectasis. Superimposed more confluent and less dependent mostly sub solid opacity in the posterior left upper lobe and superior segment left lower lobe (series 5, images 19-23).  Scattered small bilateral calcified granulomas. Major airways are patent.  Cardiomegaly. No pericardial effusion. Aortic and coronary artery calcified plaque. Negative non contrast thoracic inlet. No mediastinal lymphadenopathy. Mildly increased hilar and subcarinal nodal tissue appears stable allowing for no IV contrast today. No axillary lymphadenopathy.  Negative visualized non contrast liver, spleen, pancreas, and upper abdominal bowel. Cholelithiasis.  Chronic soft tissue enlargement of the left adrenal gland with chronic dystrophic appearing calcification. This is partially visible today and was partially visible in 2012, the visualized portion is stable. There is mild chronic right adrenal gland thickening, probably related to hyperplasia and not suspicious in appearance.  Chronic degenerative changes in the spine with osteophytosis. However, there are multiple (approximately  eight) new round sclerotic foci scattered in the thoracic spine, individually measuring up to 9 mm. No lytic or destructive osseous lesion identified. Sternum and manubrium appear spared along with the ribs and visible lumbar spine.  IMPRESSION: 1. 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 No primary identified. Consider metastatic prostate cancer. 2. Severe chronic lung disease, progressed since 2012. Small layering pleural effusions. Suspect superimposed acute infectious  exacerbation in the posterior left upper lobe and superior segment left lower lobe. 3. No suspicious lung mass.  No mediastinal lymphadenopathy. 4. Chronic nonspecific but stable appearing (since 09/22/2010) left adrenal gland enlargement with dystrophic calcification. Benign etiology strongly favored. These results will be called to the ordering clinician or representative by the Radiologist Assistant, and communication documented in the PACS Dashboard.  Electronically Signed: By: Lars Pinks M.D. On: 04/22/2013 11:22   Mr Thoracic Spine Wo Contrast  04/24/2013   CLINICAL DATA:  Unexplained sclerotic lesions in the spine. History of prostate cancer with slowly rising serum PSA levels.  EXAM: MRI THORACIC AND LUMBAR SPINE WITHOUT CONTRAST  TECHNIQUE: Multiplanar and multiecho pulse sequences of the thoracic and lumbar spine were obtained without intravenous contrast.  COMPARISON:  NM BONE WHOLE BODY dated 04/24/2013; CT CHEST W/O CM dated 04/22/2013; CT ANGIO CHEST W/CM &/OR WO/CM dated 09/23/2010  FINDINGS: MR THORACIC SPINE FINDINGS  Sagittal localizing images of the cervical spine demonstrate no focal lesions or malalignment.  As demonstrated on the recent chest CT, there are several sclerotic lesions within the thoracic spine, demonstrating uniformly low T1 and T2 signal. There is no surrounding marrow edema. Lesions are noted at T2, T5, T7, T8, T9 and T10. There are hemangiomas at T6 and T10. There is no evidence of pathologic fracture or epidural tumor.  The thoracic cord is normal in signal and caliber. There is no  disc herniation, spinal stenosis or nerve root encroachment.  Small bilateral pleural effusions and chronic lung disease are similar to the recent chest CT.  MR LUMBAR SPINE FINDINGS  The images through the lumbar spine are moderately motion degraded. There is a large sclerotic lesion nearly replacing the L2 vertebral body. This demonstrates increased uptake on today's bone scan. There are additional  smaller lesions throughout all of the other lumbar vertebral bodies and upper sacrum. There is no evidence of pathologic fracture or epidural tumor.  The lumbar spinal canal is relatively small on a congenital basis. The conus medullaris extends to the L1-2 disc space level and appears normal.  There are scattered prominent retroperitoneal lymph nodes, best seen on the sagittal images. There is ectasia of the distal abdominal aorta which measures up to 3.5 cm in AP diameter.  L1-2: Mild disc bulging without significant resulting spinal stenosis or nerve root encroachment.  L2-3: Mild disc bulging. No significant spinal stenosis or nerve root encroachment.  L3-4: There is moderate disc bulging with mild facet and ligamentous hypertrophy. These factors contribute to mild spinal and lateral recess stenosis bilaterally. The foramina appear sufficiently patent.  L4-5: Mild disc bulging with mild facet and ligamentous hypertrophy. No significant spinal stenosis or nerve root encroachment.  L5-S1: There is chronic disc degeneration with annular disc bulging and paraspinal osteophytes. Mild facet degenerative changes are present bilaterally. There is mild narrowing of both foramina.  IMPRESSION: 1. As demonstrated on recent studies, there are multiple new sclerotic lesions throughout the thoracolumbar spine, largest at L2. The L2 lesion demonstrates uptake on today's bone scan. Again, these findings remain suspicious for blastic metastatic disease, possibly treated. 2. No evidence of pathologic fracture or epidural tumor. 3. Prominent retroperitoneal lymph nodes, also potentially indicating metastatic disease. 4. Relatively mild multilevel lumbar spondylosis with mild multifactorial spinal stenosis at L3-4. 5. Ectasia of the abdominal aorta.   Electronically Signed   By: Camie Patience M.D.   On: 04/24/2013 17:10   Mr Lumbar Spine Wo Contrast  04/24/2013   CLINICAL DATA:  Unexplained sclerotic lesions in the spine. History  of prostate cancer with slowly rising serum PSA levels.  EXAM: MRI THORACIC AND LUMBAR SPINE WITHOUT CONTRAST  TECHNIQUE: Multiplanar and multiecho pulse sequences of the thoracic and lumbar spine were obtained without intravenous contrast.  COMPARISON:  NM BONE WHOLE BODY dated 04/24/2013; CT CHEST W/O CM dated 04/22/2013; CT ANGIO CHEST W/CM &/OR WO/CM dated 09/23/2010  FINDINGS: MR THORACIC SPINE FINDINGS  Sagittal localizing images of the cervical spine demonstrate no focal lesions or malalignment.  As demonstrated on the recent chest CT, there are several sclerotic lesions within the thoracic spine, demonstrating uniformly low T1 and T2 signal. There is no surrounding marrow edema. Lesions are noted at T2, T5, T7, T8, T9 and T10. There are hemangiomas at T6 and T10. There is no evidence of pathologic fracture or epidural tumor.  The thoracic cord is normal in signal and caliber. There is no disc herniation, spinal stenosis or nerve root encroachment.  Small bilateral pleural effusions and chronic lung disease are similar to the recent chest CT.  MR LUMBAR SPINE FINDINGS  The images through the lumbar spine are moderately motion degraded. There is a large sclerotic lesion nearly replacing the L2 vertebral body. This demonstrates increased uptake on today's bone scan. There are additional smaller lesions throughout all of the other lumbar vertebral bodies and upper sacrum. There is no evidence of pathologic fracture or epidural tumor.  The lumbar spinal canal is relatively small on a congenital basis. The conus medullaris extends to the L1-2 disc space level and appears normal.  There are scattered prominent retroperitoneal lymph nodes, best seen on the sagittal images. There is ectasia of the distal abdominal aorta which measures up to 3.5 cm in AP diameter.  L1-2: Mild disc bulging without significant resulting spinal stenosis or nerve root encroachment.  L2-3: Mild disc bulging. No significant spinal stenosis or  nerve root encroachment.  L3-4: There is moderate disc bulging with mild facet and ligamentous hypertrophy. These factors contribute to mild spinal and lateral recess stenosis bilaterally. The foramina appear sufficiently patent.  L4-5: Mild disc bulging with mild facet and ligamentous hypertrophy. No significant spinal stenosis or nerve root encroachment.  L5-S1: There is chronic disc degeneration with annular disc bulging and paraspinal osteophytes. Mild facet degenerative changes are present bilaterally. There is mild narrowing of both foramina.  IMPRESSION: 1. As demonstrated on recent studies, there are multiple new sclerotic lesions throughout the thoracolumbar spine, largest at L2. The L2 lesion demonstrates uptake on today's bone scan. Again, these findings remain suspicious for blastic metastatic disease, possibly treated. 2. No evidence of pathologic fracture or epidural tumor. 3. Prominent retroperitoneal lymph nodes, also potentially indicating metastatic disease. 4. Relatively mild multilevel lumbar spondylosis with mild multifactorial spinal stenosis at L3-4. 5. Ectasia of the abdominal aorta.   Electronically Signed   By: Camie Patience M.D.   On: 04/24/2013 17:10   Nm Bone Scan Whole Body  04/24/2013   CLINICAL DATA:  History of prostate cancer with new sclerotic bone lesions on chest CT. Slowly rising PSA, 1.89 on 04/22/2013.  EXAM: NUCLEAR MEDICINE WHOLE BODY BONE SCAN  TECHNIQUE: Whole body anterior and posterior images were obtained approximately 3 hours after intravenous injection of radiopharmaceutical.  COMPARISON:  CT CHEST W/O CM dated 04/22/2013; CT ANGIO CHEST W/CM &/OR WO/CM dated 09/23/2010  RADIOPHARMACEUTICALS:  25.0 mCi Technetium-99 MDP  FINDINGS: There is lower right thoracic paraspinal activity, corresponding with paraspinal osteophytes on CT. I do not appreciate focally increased activity within the thoracic vertebral bodies to correspond with the sclerotic lesions on CT. There is  diffusely increased activity within the L2 vertebral body, best seen on the posterior images. The spinal activity is otherwise unremarkable.  There is no abnormal activity within the ribs, pelvis or extremities.  IMPRESSION: 1. There is no definite abnormal activity within the thoracic spine to correspond with the new sclerotic lesions on CT. There is lower right paraspinal activity corresponding with osteophytes on CT. 2. There is prominent activity within the L2 vertebral body, not covered on the recent chest CT. This could reflect a metastasis. Correlation with lumbar spine radiographs is recommended. 3. Because the thoracic spine sclerotic lesions are new, they remain suspicious for blastic metastatic disease, possibly treated given the absence of bone scan activity.   Electronically Signed   By: Camie Patience M.D.   On: 04/24/2013 11:27  Results for orders placed during the hospital encounter of 04/20/13  CBC WITH DIFFERENTIAL      Result Value Ref Range   WBC 12.4 (*) 4.0 - 10.5 K/uL   RBC 1.77 (*) 4.22 - 5.81 MIL/uL   Hemoglobin 4.8 (*) 13.0 - 17.0 g/dL   HCT 16.1 (*) 39.0 - 52.0 %   MCV 91.0  78.0 - 100.0 fL   MCH 27.1  26.0 - 34.0 pg   MCHC 29.8 (*) 30.0 - 36.0 g/dL   RDW 25.5 (*)  11.5 - 15.5 %   Platelets 276  150 - 400 K/uL   Neutrophils Relative % 86 (*) 43 - 77 %   Lymphocytes Relative 9 (*) 12 - 46 %   Monocytes Relative 3  3 - 12 %   Eosinophils Relative 2  0 - 5 %   Basophils Relative 0  0 - 1 %   Neutro Abs 10.7 (*) 1.7 - 7.7 K/uL   Lymphs Abs 1.1  0.7 - 4.0 K/uL   Monocytes Absolute 0.4  0.1 - 1.0 K/uL   Eosinophils Absolute 0.2  0.0 - 0.7 K/uL   Basophils Absolute 0.0  0.0 - 0.1 K/uL   RBC Morphology POLYCHROMASIA PRESENT     WBC Morphology MILD LEFT SHIFT (1-5% METAS, OCC MYELO, OCC BANDS)    COMPREHENSIVE METABOLIC PANEL      Result Value Ref Range   Sodium 138  137 - 147 mEq/L   Potassium 4.3  3.7 - 5.3 mEq/L   Chloride 103  96 - 112 mEq/L   CO2 22  19 - 32 mEq/L    Glucose, Bld 266 (*) 70 - 99 mg/dL   BUN 69 (*) 6 - 23 mg/dL   Creatinine, Ser 2.32 (*) 0.50 - 1.35 mg/dL   Calcium 9.1  8.4 - 10.5 mg/dL   Total Protein 6.1  6.0 - 8.3 g/dL   Albumin 2.9 (*) 3.5 - 5.2 g/dL   AST 15  0 - 37 U/L   ALT 13  0 - 53 U/L   Alkaline Phosphatase 60  39 - 117 U/L   Total Bilirubin <0.2 (*) 0.3 - 1.2 mg/dL   GFR calc non Af Amer 25 (*) >90 mL/min   GFR calc Af Amer 29 (*) >90 mL/min  PRO B NATRIURETIC PEPTIDE      Result Value Ref Range   Pro B Natriuretic peptide (BNP) 1139.0 (*) 0 - 450 pg/mL  URINALYSIS, ROUTINE W REFLEX MICROSCOPIC      Result Value Ref Range   Color, Urine YELLOW  YELLOW   APPearance CLEAR  CLEAR   Specific Gravity, Urine 1.012  1.005 - 1.030   pH 5.0  5.0 - 8.0   Glucose, UA NEGATIVE  NEGATIVE mg/dL   Hgb urine dipstick NEGATIVE  NEGATIVE   Bilirubin Urine NEGATIVE  NEGATIVE   Ketones, ur NEGATIVE  NEGATIVE mg/dL   Protein, ur NEGATIVE  NEGATIVE mg/dL   Urobilinogen, UA 0.2  0.0 - 1.0 mg/dL   Nitrite NEGATIVE  NEGATIVE   Leukocytes, UA NEGATIVE  NEGATIVE  CBC      Result Value Ref Range   WBC 15.6 (*) 4.0 - 10.5 K/uL   RBC 2.29 (*) 4.22 - 5.81 MIL/uL   Hemoglobin 6.3 (*) 13.0 - 17.0 g/dL   HCT 20.4 (*) 39.0 - 52.0 %   MCV 89.1  78.0 - 100.0 fL   MCH 27.5  26.0 - 34.0 pg   MCHC 30.9  30.0 - 36.0 g/dL   RDW 21.1 (*) 11.5 - 15.5 %   Platelets 258  150 - 400 K/uL  COMPREHENSIVE METABOLIC PANEL      Result Value Ref Range   Sodium 139  137 - 147 mEq/L   Potassium 4.2  3.7 - 5.3 mEq/L   Chloride 103  96 - 112 mEq/L   CO2 24  19 - 32 mEq/L   Glucose, Bld 130 (*) 70 - 99 mg/dL   BUN 61 (*) 6 - 23 mg/dL  Creatinine, Ser 2.30 (*) 0.50 - 1.35 mg/dL   Calcium 8.6  8.4 - 10.5 mg/dL   Total Protein 5.6 (*) 6.0 - 8.3 g/dL   Albumin 2.7 (*) 3.5 - 5.2 g/dL   AST 16  0 - 37 U/L   ALT 13  0 - 53 U/L   Alkaline Phosphatase 55  39 - 117 U/L   Total Bilirubin 0.3  0.3 - 1.2 mg/dL   GFR calc non Af Amer 26 (*) >90 mL/min   GFR calc Af  Amer 30 (*) >90 mL/min  GLUCOSE, CAPILLARY      Result Value Ref Range   Glucose-Capillary 121 (*) 70 - 99 mg/dL   Comment 1 Notify RN    PROTIME-INR      Result Value Ref Range   Prothrombin Time 13.6  11.6 - 15.2 seconds   INR 1.06  0.00 - 1.49  CBC      Result Value Ref Range   WBC 14.6 (*) 4.0 - 10.5 K/uL   RBC 2.06 (*) 4.22 - 5.81 MIL/uL   Hemoglobin 5.7 (*) 13.0 - 17.0 g/dL   HCT 18.7 (*) 39.0 - 52.0 %   MCV 90.8  78.0 - 100.0 fL   MCH 27.7  26.0 - 34.0 pg   MCHC 30.5  30.0 - 36.0 g/dL   RDW 23.3 (*) 11.5 - 15.5 %   Platelets 289  150 - 400 K/uL  APTT      Result Value Ref Range   aPTT 30  24 - 37 seconds  GLUCOSE, CAPILLARY      Result Value Ref Range   Glucose-Capillary 135 (*) 70 - 99 mg/dL  HEMOGLOBIN AND HEMATOCRIT, BLOOD      Result Value Ref Range   Hemoglobin 7.4 (*) 13.0 - 17.0 g/dL   HCT 23.1 (*) 39.0 - 52.0 %  HEMOGLOBIN AND HEMATOCRIT, BLOOD      Result Value Ref Range   Hemoglobin 7.2 (*) 13.0 - 17.0 g/dL   HCT 23.0 (*) 39.0 - 52.0 %  HEMOGLOBIN AND HEMATOCRIT, BLOOD      Result Value Ref Range   Hemoglobin 7.0 (*) 13.0 - 17.0 g/dL   HCT 22.1 (*) 39.0 - 52.0 %  GLUCOSE, CAPILLARY      Result Value Ref Range   Glucose-Capillary 137 (*) 70 - 99 mg/dL  GLUCOSE, CAPILLARY      Result Value Ref Range   Glucose-Capillary 107 (*) 70 - 99 mg/dL   Comment 1 Notify RN    LACTATE DEHYDROGENASE      Result Value Ref Range   LDH 184  94 - 250 U/L  SAVE SMEAR      Result Value Ref Range   Smear Review SMEAR STAINED AND AVAILABLE FOR REVIEW    RETICULOCYTES      Result Value Ref Range   Retic Ct Pct 12.8 (*) 0.4 - 3.1 %   RBC. 2.52 (*) 4.22 - 5.81 MIL/uL   Retic Count, Manual 322.6 (*) 19.0 - 186.0 K/uL  VITAMIN B12      Result Value Ref Range   Vitamin B-12 823  211 - 911 pg/mL  FOLATE      Result Value Ref Range   Folate >20.0    IRON AND TIBC      Result Value Ref Range   Iron 22 (*) 42 - 135 ug/dL   TIBC 322  215 - 435 ug/dL   Saturation  Ratios 7 (*) 20 - 55 %  UIBC 300  125 - 400 ug/dL  FERRITIN      Result Value Ref Range   Ferritin 33  22 - 322 ng/mL  MULTIPLE MYELOMA PANEL, SERUM      Result Value Ref Range   Total Protein 5.6 (*) 6.0 - 8.3 g/dL   Albumin ELP 52.7 (*) 55.8 - 66.1 %   Alpha-1-Globulin 8.9 (*) 2.9 - 4.9 %   Alpha-2-Globulin 11.7  7.1 - 11.8 %   Beta Globulin 8.7 (*) 4.7 - 7.2 %   Beta 2 5.3  3.2 - 6.5 %   Gamma Globulin 12.7  11.1 - 18.8 %   M-Spike, % NOT DETECTED     SPE Interp. (NOTE)     Comment (NOTE)     IgG (Immunoglobin G), Serum 678 (*) 650 - 1600 mg/dL   IgA 242  68 - 379 mg/dL   IgM, Serum 28 (*) 41 - 251 mg/dL   Immunofix Electr Int (NOTE)    CREATININE CLEARANCE, URINE, 24 HOUR      Result Value Ref Range   Urine Total Volume-CRCL 3600     Collection Interval-CRCL 24     Creatinine, Urine 55.4     Creatinine 2.67 (*) 0.50 - 1.35 mg/dL   Creatinine, 24H Ur 1994  800 - 2000 mg/day   Creatinine Clearance 52 (*) 75 - 125 mL/min  PROTEIN, URINE, 24 HOUR      Result Value Ref Range   Urine Total Volume-UPROT 3600     Collection Interval-UPROT 24     Protein, Urine 13     Protein, 24H Urine 468 (*) 50 - 100 mg/day  IMMUNOFIXATION ELECTROPHORESIS, URINE (WITH TOT PROT)      Result Value Ref Range   Time 24     Volume, Urine 3600     Total Protein, Urine 13.9     Total Protein, Urine-Ur/day 500 (*) 10 - 140 mg/day   Albumin, U DETECTED  DETECTED   Alpha 1, Urine DETECTED (*) NONE DETECTED   Alpha 2, Urine DETECTED (*) NONE DETECTED   Beta, Urine DETECTED (*) NONE DETECTED   Gamma Globulin, Urine DETECTED (*) NONE DETECTED   Free Kappa Lt Chains,Ur 6.22 (*) 0.14 - 2.42 mg/dL   Free Lt Chn Excr Rate 223.92     Free Lambda Lt Chains,Ur 0.98 (*) 0.02 - 0.67 mg/dL   Free Lambda Excretion/Day 35.28     Free Kappa/Lambda Ratio 6.35  2.04 - 10.37 ratio   Immunofixation, Urine (NOTE)    ERYTHROPOIETIN      Result Value Ref Range   Erythropoietin 779.3 (*) 2.6 - 18.5 mIU/mL   GLUCOSE, CAPILLARY      Result Value Ref Range   Glucose-Capillary 140 (*) 70 - 99 mg/dL   Comment 1 Notify RN    MAGNESIUM      Result Value Ref Range   Magnesium 2.3  1.5 - 2.5 mg/dL  HEMOGLOBIN AND HEMATOCRIT, BLOOD      Result Value Ref Range   Hemoglobin 6.5 (*) 13.0 - 17.0 g/dL   HCT 21.1 (*) 39.0 - 52.0 %  GLUCOSE, CAPILLARY      Result Value Ref Range   Glucose-Capillary 109 (*) 70 - 99 mg/dL  HEMOGLOBIN AND HEMATOCRIT, BLOOD      Result Value Ref Range   Hemoglobin 7.9 (*) 13.0 - 17.0 g/dL   HCT 25.2 (*) 39.0 - 52.0 %  GLUCOSE, CAPILLARY      Result Value Ref Range  Glucose-Capillary 228 (*) 70 - 99 mg/dL   Comment 1 Notify RN    GLUCOSE 6 PHOSPHATE DEHYDROGENASE      Result Value Ref Range   G6PDH 18.1  7.0 - 20.5 U/g Hgb  HAPTOGLOBIN      Result Value Ref Range   Haptoglobin 186  45 - 215 mg/dL  PSA      Result Value Ref Range   PSA 1.89  <=4.00 ng/mL  CBC      Result Value Ref Range   WBC 10.1  4.0 - 10.5 K/uL   RBC 2.90 (*) 4.22 - 5.81 MIL/uL   Hemoglobin 8.5 (*) 13.0 - 17.0 g/dL   HCT 26.1 (*) 39.0 - 52.0 %   MCV 90.0  78.0 - 100.0 fL   MCH 29.3  26.0 - 34.0 pg   MCHC 32.6  30.0 - 36.0 g/dL   RDW 18.6 (*) 11.5 - 15.5 %   Platelets 217  150 - 400 K/uL  BASIC METABOLIC PANEL      Result Value Ref Range   Sodium 138  137 - 147 mEq/L   Potassium 4.1  3.7 - 5.3 mEq/L   Chloride 102  96 - 112 mEq/L   CO2 23  19 - 32 mEq/L   Glucose, Bld 219 (*) 70 - 99 mg/dL   BUN 67 (*) 6 - 23 mg/dL   Creatinine, Ser 2.66 (*) 0.50 - 1.35 mg/dL   Calcium 8.2 (*) 8.4 - 10.5 mg/dL   GFR calc non Af Amer 21 (*) >90 mL/min   GFR calc Af Amer 25 (*) >90 mL/min  GLUCOSE, CAPILLARY      Result Value Ref Range   Glucose-Capillary 215 (*) 70 - 99 mg/dL   Comment 1 Notify RN    GLUCOSE, CAPILLARY      Result Value Ref Range   Glucose-Capillary 241 (*) 70 - 99 mg/dL   Comment 1 Notify RN    GLUCOSE, CAPILLARY      Result Value Ref Range   Glucose-Capillary 194 (*) 70 -  99 mg/dL   Comment 1 Notify RN    GLUCOSE, CAPILLARY      Result Value Ref Range   Glucose-Capillary 211 (*) 70 - 99 mg/dL   Comment 1 Notify RN    GLUCOSE, CAPILLARY      Result Value Ref Range   Glucose-Capillary 228 (*) 70 - 99 mg/dL   Comment 1 Notify RN    CBC      Result Value Ref Range   WBC 8.9  4.0 - 10.5 K/uL   RBC 2.89 (*) 4.22 - 5.81 MIL/uL   Hemoglobin 8.2 (*) 13.0 - 17.0 g/dL   HCT 25.7 (*) 39.0 - 52.0 %   MCV 88.9  78.0 - 100.0 fL   MCH 28.4  26.0 - 34.0 pg   MCHC 31.9  30.0 - 36.0 g/dL   RDW 17.9 (*) 11.5 - 15.5 %   Platelets 242  150 - 400 K/uL  GLUCOSE, CAPILLARY      Result Value Ref Range   Glucose-Capillary 205 (*) 70 - 99 mg/dL  GLUCOSE, CAPILLARY      Result Value Ref Range   Glucose-Capillary 149 (*) 70 - 99 mg/dL  GLUCOSE, CAPILLARY      Result Value Ref Range   Glucose-Capillary 220 (*) 70 - 99 mg/dL   Comment 1 Notify RN    CBC WITH DIFFERENTIAL      Result Value Ref Range  WBC 7.8  4.0 - 10.5 K/uL   RBC 2.87 (*) 4.22 - 5.81 MIL/uL   Hemoglobin 8.0 (*) 13.0 - 17.0 g/dL   HCT 25.9 (*) 39.0 - 52.0 %   MCV 90.2  78.0 - 100.0 fL   MCH 27.9  26.0 - 34.0 pg   MCHC 30.9  30.0 - 36.0 g/dL   RDW 17.8 (*) 11.5 - 15.5 %   Platelets 254  150 - 400 K/uL   Neutrophils Relative % 79 (*) 43 - 77 %   Neutro Abs 6.1  1.7 - 7.7 K/uL   Lymphocytes Relative 11 (*) 12 - 46 %   Lymphs Abs 0.8  0.7 - 4.0 K/uL   Monocytes Relative 7  3 - 12 %   Monocytes Absolute 0.5  0.1 - 1.0 K/uL   Eosinophils Relative 3  0 - 5 %   Eosinophils Absolute 0.3  0.0 - 0.7 K/uL   Basophils Relative 0  0 - 1 %   Basophils Absolute 0.0  0.0 - 0.1 K/uL  RETICULOCYTES      Result Value Ref Range   Retic Ct Pct 8.5 (*) 0.4 - 3.1 %   RBC. 2.87 (*) 4.22 - 5.81 MIL/uL   Retic Count, Manual 244.0 (*) 19.0 - 186.0 K/uL  GLUCOSE, CAPILLARY      Result Value Ref Range   Glucose-Capillary 193 (*) 70 - 99 mg/dL   Comment 1 Notify RN    GLUCOSE, CAPILLARY      Result Value Ref Range    Glucose-Capillary 195 (*) 70 - 99 mg/dL   Comment 1 Notify RN    GLUCOSE, CAPILLARY      Result Value Ref Range   Glucose-Capillary 148 (*) 70 - 99 mg/dL  GLUCOSE, CAPILLARY      Result Value Ref Range   Glucose-Capillary 203 (*) 70 - 99 mg/dL   Comment 1 Notify RN    GLUCOSE, CAPILLARY      Result Value Ref Range   Glucose-Capillary 260 (*) 70 - 99 mg/dL   Comment 1 Notify RN    BASIC METABOLIC PANEL      Result Value Ref Range   Sodium 136 (*) 137 - 147 mEq/L   Potassium 3.8  3.7 - 5.3 mEq/L   Chloride 99  96 - 112 mEq/L   CO2 23  19 - 32 mEq/L   Glucose, Bld 246 (*) 70 - 99 mg/dL   BUN 58 (*) 6 - 23 mg/dL   Creatinine, Ser 2.93 (*) 0.50 - 1.35 mg/dL   Calcium 8.5  8.4 - 10.5 mg/dL   GFR calc non Af Amer 19 (*) >90 mL/min   GFR calc Af Amer 22 (*) >90 mL/min  GLUCOSE, CAPILLARY      Result Value Ref Range   Glucose-Capillary 150 (*) 70 - 99 mg/dL   Comment 1 Notify RN    GLUCOSE, CAPILLARY      Result Value Ref Range   Glucose-Capillary 197 (*) 70 - 99 mg/dL  CBC      Result Value Ref Range   WBC 7.5  4.0 - 10.5 K/uL   RBC 3.09 (*) 4.22 - 5.81 MIL/uL   Hemoglobin 8.5 (*) 13.0 - 17.0 g/dL   HCT 27.7 (*) 39.0 - 52.0 %   MCV 89.6  78.0 - 100.0 fL   MCH 27.5  26.0 - 34.0 pg   MCHC 30.7  30.0 - 36.0 g/dL   RDW 16.9 (*) 11.5 - 15.5 %  Platelets 281  150 - 400 K/uL  GLUCOSE, CAPILLARY      Result Value Ref Range   Glucose-Capillary 200 (*) 70 - 99 mg/dL   Comment 1 Notify RN    GLUCOSE, CAPILLARY      Result Value Ref Range   Glucose-Capillary 165 (*) 70 - 99 mg/dL   Comment 1 Notify RN    GLUCOSE, CAPILLARY      Result Value Ref Range   Glucose-Capillary 239 (*) 70 - 99 mg/dL   Comment 1 Notify RN    GLUCOSE, CAPILLARY      Result Value Ref Range   Glucose-Capillary 146 (*) 70 - 99 mg/dL   Comment 1 Notify RN    CBC WITH DIFFERENTIAL      Result Value Ref Range   WBC 9.8  4.0 - 10.5 K/uL   RBC 3.26 (*) 4.22 - 5.81 MIL/uL   Hemoglobin 9.0 (*) 13.0 - 17.0  g/dL   HCT 28.9 (*) 39.0 - 52.0 %   MCV 88.7  78.0 - 100.0 fL   MCH 27.6  26.0 - 34.0 pg   MCHC 31.1  30.0 - 36.0 g/dL   RDW 16.8 (*) 11.5 - 15.5 %   Platelets 316  150 - 400 K/uL   Neutrophils Relative % 80 (*) 43 - 77 %   Lymphocytes Relative 8 (*) 12 - 46 %   Monocytes Relative 9  3 - 12 %   Eosinophils Relative 3  0 - 5 %   Basophils Relative 0  0 - 1 %   Neutro Abs 7.8 (*) 1.7 - 7.7 K/uL   Lymphs Abs 0.8  0.7 - 4.0 K/uL   Monocytes Absolute 0.9  0.1 - 1.0 K/uL   Eosinophils Absolute 0.3  0.0 - 0.7 K/uL   Basophils Absolute 0.0  0.0 - 0.1 K/uL   RBC Morphology POLYCHROMASIA PRESENT    RETICULOCYTES      Result Value Ref Range   Retic Ct Pct 6.7 (*) 0.4 - 3.1 %   RBC. 3.26 (*) 4.22 - 5.81 MIL/uL   Retic Count, Manual 218.4 (*) 19.0 - 186.0 K/uL  I-STAT TROPOININ, ED      Result Value Ref Range   Troponin i, poc 0.04  0.00 - 0.08 ng/mL   Comment 3           POC OCCULT BLOOD, ED      Result Value Ref Range   Fecal Occult Bld POSITIVE (*) NEGATIVE  TYPE AND SCREEN      Result Value Ref Range   ABO/RH(D) O POS     Antibody Screen NEG     Sample Expiration 04/23/2013     Unit Number [id number]     Blood Component Type RED CELLS,LR     Unit division 00     Status of Unit ISSUED,FINAL     Transfusion Status OK TO TRANSFUSE     Crossmatch Result Compatible     Unit Number [id number]     Blood Component Type RBC LR PHER1     Unit division 00     Status of Unit ISSUED,FINAL     Transfusion Status OK TO TRANSFUSE     Crossmatch Result Compatible     Unit Number [id number]     Blood Component Type RED CELLS,LR     Unit division 00     Status of Unit ISSUED,FINAL     Transfusion Status OK TO TRANSFUSE     Crossmatch  Result Compatible     Unit Number [id number]     Blood Component Type RED CELLS,LR     Unit division 00     Status of Unit ISSUED,FINAL     Transfusion Status OK TO TRANSFUSE     Crossmatch Result Compatible     Unit Number [id number]      Blood Component Type RED CELLS,LR     Unit division 00     Status of Unit ISSUED,FINAL     Transfusion Status OK TO TRANSFUSE     Crossmatch Result Compatible    PREPARE RBC (CROSSMATCH)      Result Value Ref Range   Order Confirmation ORDER PROCESSED BY BLOOD BANK    PREPARE RBC (CROSSMATCH)      Result Value Ref Range   Order Confirmation ORDER PROCESSED BY BLOOD BANK    DIRECT ANTIGLOBULIN TEST      Result Value Ref Range   DAT, complement NEG     DAT, IgG NEG    PREPARE RBC (CROSSMATCH)      Result Value Ref Range   Order Confirmation ORDER PROCESSED BY BLOOD BANK     A/P: Pt with past hx prostate carcinoma; now with multiple thoracic/lumbar spine sclerotic bony lesions/prominent RP lymph nodes  of unknown etiology. Plan is for CT guided L2 lesion biopsy today. Details/risks of procedure d/w pt/pt's daughter with their understanding and consent.

## 2013-04-27 NOTE — Progress Notes (Signed)
Clinical Social Work  CSW spoke with MD who reports that patient should be medically stable to DC tomorrow. CSW spoke with [facility] (SNF) who reports authorization from [facility] has been received. CSW will continue to follow.   [place], [place] [phone]

## 2013-04-27 NOTE — Progress Notes (Signed)
MRI scan reviewed with Radiologist.  L-2 most involved and amenable to biopsy 24 hour urine protein 500 mg, appears non- selective proteinuria with a minor component of monoclonal free lambda free light chain of 1 mg% = 36 mg on 3,600 ml urine volume so doubt that this is light chain myeloma or amyloidosis Therefore, I feel lumbar spine biopsy will be higher yield for dx than a bone marrow biopsy. I discussed with the patient who consents to procedure. I discussed with his daughter, Orlando Penner by phone who also in agreement. I placed order for the biopsy in EPIC

## 2013-04-27 NOTE — Progress Notes (Signed)
Physical Therapy Treatment Patient Details Name: [name] MRN: [id number] DOB: Aug 31, 1932 Today's Date: 04/27/2013 Time: 9798-9211 PT Time Calculation (min): 15 min  PT Assessment / Plan / Recommendation  History of Present Illness 80 yo male admitted with anemia, fall. Hx of HTN, DM, CHF, Afib, CVA. Pt lives alone;   PT Comments   Pt to go for CT guided spine bx today; Plan for SNF remains appropriate unless family able to provide 24hr care; Will continue to follow and assess needs; Pt is pleasant and cooperative but overall expresses  feeling badly about the current situation;   Follow Up Recommendations  SNF     Does the patient have the potential to tolerate intense rehabilitation     Barriers to Discharge        Equipment Recommendations  None recommended by PT    Recommendations for Other Services    Frequency Min 3X/week   Progress towards PT Goals Progress towards PT goals: Progressing toward goals  Plan Current plan remains appropriate    Precautions / Restrictions Precautions Precautions: Fall   Pertinent Vitals/Pain VSS this session; DOE 2/4 with activity, better than last session    Mobility  Transfers Overall transfer level: Needs assistance Equipment used: Rolling walker (2 wheeled) Transfers: Sit to/from Stand Sit to Stand: Min guard;Min assist General transfer comment: verbal cues for safe hand placement and control of descent Ambulation/Gait Ambulation/Gait assistance: Min assist Ambulation Distance (Feet): 80 Feet Assistive device: Rolling walker (2 wheeled) Gait Pattern/deviations: Step-through pattern;Decreased dorsiflexion - left;Decreased stride length;Wide base of support Gait velocity: decreased General Gait Details: pt continues with unsteady gait; min assist for balance and cues for RW safety throughout, esp with turns    Exercises General Exercises - Lower Extremity Ankle Circles/Pumps: AROM;Both;10 reps Quad Sets: AROM;Both;5 reps    PT Diagnosis:    PT Problem List:   PT Treatment Interventions:     PT Goals (current goals can now be found in the care plan section) Acute Rehab PT Goals Patient Stated Goal: to get better and d/c to daughter's home Time For Goal Achievement: 05/06/13 Potential to Achieve Goals: Good  Visit Information  Last PT Received On: 04/27/13 Assistance Needed: +1 History of Present Illness: 80 yo male admitted with anemia, fall. Hx of HTN, DM, CHF, Afib, CVA. Pt lives alone;    Subjective Data  Subjective: ok baby Patient Stated Goal: to get better and d/c to daughter's home   Cognition  Cognition Arousal/Alertness: Awake/alert Behavior During Therapy: WFL for tasks assessed/performed Overall Cognitive Status: Within Functional Limits for tasks assessed    Balance     End of Session PT - End of Session Equipment Utilized During Treatment: Gait belt Activity Tolerance: Patient limited by fatigue Patient left: in chair;with call bell/phone within reach Nurse Communication: Mobility status   GP     [facility] 04/27/2013, 11:56 AM

## 2013-04-27 NOTE — Progress Notes (Signed)
[facility] PROGRESS NOTE  HERCULES HASLER FXT:[id number] DOB: 1932/10/08 DOA: 04/20/2013 PCP: [name], MD  Assessment/Plan: 80 year old man withHTN, CAD, CHF, IDDM, CKD, PAF, CKD with currently recorded creatinine of 2.3 presented to the emergency department on 04/20/2013 complaining of generalized weakness and dyspnea  found to be profoundly anemic with a hemoglobin of 4.8 found to have metastatic bone lesions   1. Anemia multifactorial: IDA+likely worse with blood loss; + hem occults+hematuria +? MM  -s/p upper GI endoscopy which showed benign gastric polyps. -2 units of blood transfusion on admission; TFsed 2 units 3/4; monitor  -no signs of hemolysis   2. Metastatic bone lesions; h/o prostate CA s/p prostatectomy;  -? MM (+light chains) +vs metastatic lesions -CT/MRI: Multiple new round sclerotic lesions in thoracolumbar spine, largest at L2. most  Compatible with blastic osseous metastatic disease; -Bone scan: There is no definite abnormal activity within the thoracic spine to correspond with the new sclerotic lesions on CT. There is lower right paraspinal activity corresponding with osteophytes on CT, but L 2 level;  -PSA WNL;  appreciate hem/onc input; evaluate for IR biopsy requested by hem/onc     2. Hematuria hematuria last night and urology was consulted.  -s/p cystoscopy which showed tear in the urethra. Patient has a Foley catheter in place, which needs to stay there for a week.  -follow the urology in one week for a voiding trial/Foley removal   3. Community acquired pneumonia; chest x-ray right hilar contour and middle lobe opacity - on Levaquin, improving   4. GI bleed history of hemorrhoids as well as gastritis. He had both endoscopy and colonoscopy 5 years ago.  -GI performed EGD, no plans for colonoscopy. Recommendation for low-dose PPI 20 mg of daily   5. Congestive heart failure echo (2013): LVEF 29%, grade 2 diastolic dysfunction -continue Demadex,  decreased the dose 3/8 to 60 BID-->daily due to mild worsening renal function    6. Diabetes mellitus; HA1C-7.4 (07/2012) -Continue sliding scale insulin; d/c metformin not candidate due to CKD  7. Hypertension Continue home regimen including hydralazine, Catapres patch.   8. History of CAD Continue Imdur, aspirin.   9. PAF not on AC due to GIB, bleeding, fall risk; cont amiodarone; not on  BB;   10. CKD III; not oliguric; cont diuretics; need outpatient nephrology monitor    SNF likely on 3/10 if stable   DVT prophylaxis  SCDs  D/w patient, he is DNR; confirmed with her daughter   Code Status: DNR  Family Communication: d/w patient, [name] Daughter [phone]  (indicate person spoken with, relationship, and if by phone, the number) Disposition Plan: pend preauthorization   Consultants:  Hem onc,  Consultants:  GI  Hematology Procedures:  EGD- showed benign-appearing gastric polyps  Cystoscopy- urethral tear   Antibiotics:  Levofloxacin 3/3<<<< (indicate start date, and stop date if known)  HPI/Subjective: alert  Objective: Filed Vitals:   04/27/13 0520  BP: 143/54  Pulse: 66  Temp: 97.5 F (36.4 C)  Resp: 20    Intake/Output Summary (Last 24 hours) at 04/27/13 1001 Last data filed at 04/27/13 0856  Gross per 24 hour  Intake    720 ml  Output   2300 ml  Net  -1580 ml   Filed Weights   04/20/13 1652 04/21/13 1137  Weight: 109.5 kg (241 lb 6.5 oz) 109.317 kg (241 lb)    Exam:   General:  alert  Cardiovascular: s1,s2 rrr  Respiratory: few crackles in LL  Abdomen: soft, distended, nt  Musculoskeletal: no edema   Data Reviewed: Basic Metabolic Panel:  Recent Labs Lab 04/20/13 1116 04/21/13 0040 04/21/13 2255 04/22/13 1801 04/23/13 0535 04/26/13 0545  NA 138 139  --   --  138 136*  K 4.3 4.2  --   --  4.1 3.8  CL 103 103  --   --  102 99  CO2 22 24  --   --  23 23  GLUCOSE 266* 130*  --   --   219* 246*  BUN 69* 61*  --   --  67* 58*  CREATININE 2.32* 2.30*  --  2.67* 2.66* 2.93*  CALCIUM 9.1 8.6  --   --  8.2* 8.5  MG  --   --  2.3  --   --   --    Liver Function Tests:  Recent Labs Lab 04/20/13 1116 04/21/13 0040 04/21/13 1405  AST 15 16  --   ALT 13 13  --   ALKPHOS 60 55  --   BILITOT <0.2* 0.3  --   PROT 6.1 5.6* 5.6*  ALBUMIN 2.9* 2.7*  --    No results found for this basename: LIPASE, AMYLASE,  in the last 168 hours No results found for this basename: AMMONIA,  in the last 168 hours CBC:  Recent Labs Lab 04/20/13 1116  04/23/13 0535 04/24/13 0555 04/25/13 0530 04/26/13 1004 04/27/13 0840  WBC 12.4*  < > 10.1 8.9 7.8 7.5 9.8  NEUTROABS 10.7*  --   --   --  6.1  --  7.8*  HGB 4.8*  < > 8.5* 8.2* 8.0* 8.5* 9.0*  HCT 16.1*  < > 26.1* 25.7* 25.9* 27.7* 28.9*  MCV 91.0  < > 90.0 88.9 90.2 89.6 88.7  PLT 276  < > 217 242 254 281 316  < > = values in this interval not displayed. Cardiac Enzymes: No results found for this basename: CKTOTAL, CKMB, CKMBINDEX, TROPONINI,  in the last 168 hours BNP (last 3 results)  Recent Labs  04/03/13 1155 04/20/13 1116  PROBNP 893.8* 1139.0*   CBG:  Recent Labs Lab 04/26/13 0628 04/26/13 1157 04/26/13 1617 04/26/13 2212 04/27/13 0743  GLUCAP 197* 200* 165* 239* 146*    No results found for this or any previous visit (from the past 240 hour(s)).   Studies: No results found.  Scheduled Meds: . amiodarone  100 mg Oral q morning - 10a  . amLODipine  10 mg Oral Daily  . aspirin EC  81 mg Oral Daily  . atorvastatin  40 mg Oral q1800  . citalopram  10 mg Oral BID  . cloNIDine  0.1 mg Transdermal Weekly  . hydrALAZINE  100 mg Oral 3 times per day  . insulin aspart  0-9 Units Subcutaneous TID WC  . insulin detemir  50 Units Subcutaneous BH-q7a  . iron polysaccharides  150 mg Oral Daily  . isosorbide mononitrate  60 mg Oral Daily  . levofloxacin  750 mg Oral Q48H  . levothyroxine  125 mcg Oral QAC  breakfast  .  morphine injection  2 mg Intravenous Once  . pantoprazole  40 mg Oral BID  . potassium chloride SA  20 mEq Oral Daily  . sodium chloride  3 mL Intravenous Q12H  . torsemide  60 mg Oral Daily   Continuous Infusions:   Principal Problem:   Anemia Active Problems:   HYPERTENSION   CHRONIC KIDNEY DISEASE STAGE III (MODERATE)  Acute on chronic diastolic heart failure   Normochromic normocytic anemia   Abnormal CXR   Hypertensive kidney disease with CKD stage III   Abnormal bone radiograph    Time spent: >35 minutes     Kinnie Feil  [facility] Pager [phone]. If 7PM-7AM, please contact night-coverage at [email or web address], password [facility] 04/27/2013, 10:01 AM  LOS: 7 days

## 2013-04-28 LAB — MISCELLANEOUS TEST

## 2013-04-28 LAB — GLUCOSE, CAPILLARY: Glucose-Capillary: 137 mg/dL — ABNORMAL HIGH (ref 70–99)

## 2013-04-28 MED ORDER — POLYSACCHARIDE IRON COMPLEX 150 MG PO CAPS: 150.0000 mg | ORAL_CAPSULE | Freq: Every day | ORAL | Status: DC

## 2013-04-28 MED ORDER — LEVOFLOXACIN 750 MG PO TABS: 750.0000 mg | ORAL_TABLET | ORAL | Status: DC

## 2013-04-28 MED ORDER — INSULIN DETEMIR 100 UNIT/ML ~~LOC~~ SOLN: 50.0000 [IU] | SUBCUTANEOUS | Status: DC

## 2013-04-28 MED ORDER — OMEPRAZOLE 20 MG PO CPDR: 40.0000 mg | DELAYED_RELEASE_CAPSULE | Freq: Every day | ORAL | Status: DC

## 2013-04-28 MED ORDER — ASPIRIN 81 MG PO TBEC: 81.0000 mg | DELAYED_RELEASE_TABLET | Freq: Every day | ORAL | Status: DC

## 2013-04-28 NOTE — Progress Notes (Signed)
Clinical Social Work  CSW faxed DC summary to [facility] who is ready to accept patient. CSW prepared DC packet with FL2 and hard scripts included. CSW informed patient and dtr of DC plans and patient is happy to be going to a facility that dtr has toured. Dtr thanked CSW for help and reports she will visit patient today at [facility]. RN aware of DC and will call report to SNF. CSW coordinated transportation via [facility] per patient and dtr request. Request #: [phone].  CSW is signing off but available if needed.  [place], [place] [phone]

## 2013-04-28 NOTE — Discharge Summary (Addendum)
Physician Discharge Summary  [name] [id number] DOB: 05/09/1932 DOA: 04/20/2013  PCP: [name], MD  Admit date: 04/20/2013 Discharge date: 04/28/2013  Time spent: >35 minutes  Recommendations for Outpatient Follow-up:  F/u with hem/onc with biopsy results in 1-2 weeks  F/u with urology in 1 week  F/u with PCP in 1-2 weeks post rehab  Discharge Diagnoses:  Principal Problem:   Anemia Active Problems:   HYPERTENSION   CHRONIC KIDNEY DISEASE STAGE III (MODERATE)   Acute on chronic diastolic heart failure   Normochromic normocytic anemia   Abnormal CXR   Hypertensive kidney disease with CKD stage III   Abnormal bone radiograph   Discharge Condition: stable   Diet recommendation: heart healthy   Filed Weights   04/20/13 1652 04/21/13 1137  Weight: 109.5 kg (241 lb 6.5 oz) 109.317 kg (241 lb)    History of present illness:  80 year old man withHTN, CAD, CHF, IDDM, CKD, PAF, CKD with currently recorded creatinine of 2.3 presented to the emergency department on 04/20/2013 complaining of generalized weakness and dyspnea found to be profoundly anemic with a hemoglobin of 4.8 found to have metastatic bone lesions    Hospital Course:  1. Anemia multifactorial: IDA+likely worse with blood loss; + hem occults+hematuria +? MM  -s/p upper GI endoscopy which showed benign gastric polyps.  -2 units of blood transfusion on admission; TFsed 2 units 3/4; no signs of hemolysis; Hg is stable  2. Metastatic bone lesions; h/o prostate CA s/p prostatectomy;  -? MM (+light chains)  vs metastatic lesions  -CT/MRI: Multiple new round sclerotic lesions in thoracolumbar spine, largest at L2. most Compatible with blastic osseous metastatic disease;  -Bone scan: There is no definite abnormal activity within the thoracic spine to correspond with the new sclerotic lesions on CT. There is lower right paraspinal activity corresponding with osteophytes on CT, but L 2 level;  -PSA WNL; appreciate  hem/onc input;s/p IR biopsy requested by hem/onc; Dr. Waymon Budge will follow up appointment  2. Hematuria hematuria last night and urology was consulted. decreased ASA to 81 due to bleeding  -s/p cystoscopy which showed tear in the urethra. Patient has a Foley catheter in place, which needs to stay there for a week.  -follow the urology in one week for a voiding trial/Foley removal  3. Community acquired pneumonia; chest x-ray right hilar contour and middle lobe opacity  - on Levaquin, improving; to complete PO med  4. GI bleed history of hemorrhoids as well as gastritis. He had both endoscopy and colonoscopy 5 years ago.  -GI performed EGD, no plans for colonoscopy. Recommendation for low-dose PPI 20 mg of daily  5. Congestive heart failure echo (2013): LVEF [id number], grade 2 diastolic dysfunction  -continue Demadex  6. Diabetes mellitus; HA1C-7.4 (07/2012)  -d/c metformin not candidate due to CKD; decreased insulin regimen due to mill hypoglycemia; titrate as need outpatient  7. Hypertension Continue home regimen including hydralazine, Catapres patch.  8. History of CAD Continue Imdur, aspirin.  9. PAF not on AC due to GIB, bleeding, fall risk; cont amiodarone; not on BB;  10. CKD III; not oliguric; cont diuretics; need outpatient nephrology monitor      Procedures:  IR bone biopsy L2 (i.e. Studies not automatically included, echos, thoracentesis, etc; not x-rays)  Consultations:  Hem/onc  Discharge Exam: Filed Vitals:   04/28/13 0553  BP: 136/60  Pulse: 73  Temp: 97.6 F (36.4 C)  Resp: 16    General: alert Cardiovascular: s1,s2 rrr Respiratory: CAT BL  Discharge Instructions     Medication List    STOP taking these medications       aspirin 325 MG tablet  Replaced by:  aspirin 81 MG EC tablet     metFORMIN 500 MG (MOD) 24 hr tablet  Commonly known as:  GLUMETZA      TAKE these medications       allopurinol 100 MG tablet  Commonly known as:  ZYLOPRIM  Take  1 tablet (100 mg total) by mouth at bedtime.     amiodarone 200 MG tablet  Commonly known as:  PACERONE  Take 0.5 tablets (100 mg total) by mouth every morning.     amLODipine 10 MG tablet  Commonly known as:  NORVASC  Take 1 tablet (10 mg total) by mouth daily.     aspirin 81 MG EC tablet  Take 1 tablet (81 mg total) by mouth daily.     atorvastatin 40 MG tablet  Commonly known as:  LIPITOR  TAKE 1 TABLET BY MOUTH EVERY DAY     citalopram 10 MG tablet  Commonly known as:  CELEXA  Take 10 mg by mouth 2 (two) times daily.     cloNIDine 0.1 mg/24hr patch  Commonly known as:  CATAPRES - Dosed in mg/24 hr  Place 1 patch (0.1 mg total) onto the skin once a week. Change on sundays     Fe Fum-Vit C-Vit B12-FA 460-60-0.01-1 MG Caps capsule  Commonly known as:  TRIGELS-F  Take 1 capsule by mouth daily.     Fluticasone-Salmeterol 100-50 MCG/DOSE Aepb  Commonly known as:  ADVAIR DISKUS  Inhale 1 puff into the lungs 2 (two) times daily.     hydrALAZINE 100 MG tablet  Commonly known as:  APRESOLINE  TAKE 1 TABLET BY MOUTH 3 TIMES A DAY     insulin detemir 100 UNIT/ML injection  Commonly known as:  LEVEMIR  Inject 0.5 mLs (50 Units total) into the skin every morning.     iron polysaccharides 150 MG capsule  Commonly known as:  NIFEREX  Take 1 capsule (150 mg total) by mouth daily.     isosorbide mononitrate 60 MG 24 hr tablet  Commonly known as:  IMDUR  Take 1 tablet (60 mg total) by mouth daily.     levofloxacin 750 MG tablet  Commonly known as:  LEVAQUIN  Take 1 tablet (750 mg total) by mouth every other day.     levothyroxine 125 MCG tablet  Commonly known as:  SYNTHROID, LEVOTHROID  TAKE 1 TABLET BY MOUTH ONCE EVERY DAY     metolazone 2.5 MG tablet  Commonly known as:  ZAROXOLYN  Take 1 tablet (2.5 mg total) by mouth as needed.     mupirocin ointment 2 %  Commonly known as:  BACTROBAN  Use qd with dressing change     omeprazole 20 MG capsule  Commonly known as:   PRILOSEC  Take 2 capsules (40 mg total) by mouth daily.     potassium chloride SA 20 MEQ tablet  Commonly known as:  K-DUR,KLOR-CON  Take 1 tablet (20 mEq total) by mouth daily.     torsemide 20 MG tablet  Commonly known as:  DEMADEX  Take 3 tablets (60 mg total) by mouth 2 (two) times daily.       Allergies  Allergen Reactions  . Ace Inhibitors Other (See Comments)    me  hyposion  . Beta Adrenergic Blockers Other (See Comments)     use cautiously  secondary to 2nd degree heart block, hypotension       Follow-up Information   Follow up with Eskridge, Marja Kays, MD In 1 week.   Specialty:  Urology   Contact information:   [facility]  [place] [address] [phone]        The results of significant diagnostics from this hospitalization (including imaging, microbiology, ancillary and laboratory) are listed below for reference.    Significant Diagnostic Studies: Dg Chest 2 View  04/20/2013   CLINICAL DATA:  Shortness of breath, weakness, history of atrial fibrillation, history of prostate can't  EXAM: CHEST  2 VIEW  COMPARISON:  DG CHEST 2 VIEW dated 06/05/2012; CT ANGIO CHEST W/CM &/OR WO/CM dated 09/23/2010  FINDINGS: Mild to moderate cardiac enlargement. Mild vascular congestion. Abnormal opacity over the right perihilar area extending laterally into the right middle lobe. There is a somewhat nodular component to the airspace opacity in the lateral right mid lung zone. There was density in this area previously, but the current opacity appears more pronounced.  IMPRESSION: Abnormal right hilar contour and middle lobe opacity. Adenopathy, mass, and pneumonia are all considerations. It is possible that this represents a progressive chronic fibrotic process. Consider evaluation contrast-enhanced CT.   Electronically Signed   By: Skipper Cliche M.D.   On: 04/20/2013 11:25   Ct Chest Wo Contrast  04/22/2013   ADDENDUM REPORT:  04/22/2013 11:35  ADDENDUM: Study discussed by telephone with Dr. Murriel Hopper on 04/22/2013 at 11:35 .   Electronically Signed   By: Lars Pinks M.D.   On: 04/22/2013 11:35   04/22/2013   CLINICAL DATA:  80 year old male with shortness of breath. Weakness. Recent abnormal chest x-ray. Initial encounter.  EXAM: CT CHEST WITHOUT CONTRAST  TECHNIQUE: Multidetector CT imaging of the chest was performed following the standard protocol without IV contrast.  COMPARISON:  Chest radiographs 04/20/2013 and earlier. Chest CTA 09/23/2010. Chest CT 09/22/2010.  FINDINGS: Chronic lung disease with emphysema and peripheral reticular opacity suggesting a degree of fibrosis. There is combined fibrosis and bronchiectasis suspected in the superior segment of the right lower lobe.  Superimposed small bilateral layering pleural effusions, slightly larger on the right. Associated dependent compressive atelectasis. Superimposed more confluent and less dependent mostly sub solid opacity in the posterior left upper lobe and superior segment left lower lobe (series 5, images 19-23).  Scattered small bilateral calcified granulomas. Major airways are patent.  Cardiomegaly. No pericardial effusion. Aortic and coronary artery calcified plaque. Negative non contrast thoracic inlet. No mediastinal lymphadenopathy. Mildly increased hilar and subcarinal nodal tissue appears stable allowing for no IV contrast today. No axillary lymphadenopathy.  Negative visualized non contrast liver, spleen, pancreas, and upper abdominal bowel. Cholelithiasis.  Chronic soft tissue enlargement of the left adrenal gland with chronic dystrophic appearing calcification. This is partially visible today and was partially visible in 2012, the visualized portion is stable. There is mild chronic right adrenal gland thickening, probably related to hyperplasia and not suspicious in appearance.  Chronic degenerative changes in the spine with osteophytosis. However, there are  multiple (approximately  eight) new round sclerotic foci scattered in the thoracic spine, individually measuring up to 9 mm. No lytic or destructive osseous lesion identified. Sternum and manubrium appear spared along with the ribs and visible lumbar spine.  IMPRESSION: 1. 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 No primary identified. Consider metastatic prostate cancer. 2. Severe chronic lung  disease, progressed since 2012. Small layering pleural effusions. Suspect superimposed acute infectious exacerbation in the posterior left upper lobe and superior segment left lower lobe. 3. No suspicious lung mass.  No mediastinal lymphadenopathy. 4. Chronic nonspecific but stable appearing (since 09/22/2010) left adrenal gland enlargement with dystrophic calcification. Benign etiology strongly favored. These results will be called to the ordering clinician or representative by the Radiologist Assistant, and communication documented in the PACS Dashboard.  Electronically Signed: By: Lars Pinks M.D. On: 04/22/2013 11:22   Mr Thoracic Spine Wo Contrast  04/24/2013   CLINICAL DATA:  Unexplained sclerotic lesions in the spine. History of prostate cancer with slowly rising serum PSA levels.  EXAM: MRI THORACIC AND LUMBAR SPINE WITHOUT CONTRAST  TECHNIQUE: Multiplanar and multiecho pulse sequences of the thoracic and lumbar spine were obtained without intravenous contrast.  COMPARISON:  NM BONE WHOLE BODY dated 04/24/2013; CT CHEST W/O CM dated 04/22/2013; CT ANGIO CHEST W/CM &/OR WO/CM dated 09/23/2010  FINDINGS: MR THORACIC SPINE FINDINGS  Sagittal localizing images of the cervical spine demonstrate no focal lesions or malalignment.  As demonstrated on the recent chest CT, there are several sclerotic lesions within the thoracic spine, demonstrating uniformly low T1 and T2 signal. There is no surrounding marrow edema. Lesions are noted at T2, T5, T7, T8, T9  and T10. There are hemangiomas at T6 and T10. There is no evidence of pathologic fracture or epidural tumor.  The thoracic cord is normal in signal and caliber. There is no disc herniation, spinal stenosis or nerve root encroachment.  Small bilateral pleural effusions and chronic lung disease are similar to the recent chest CT.  MR LUMBAR SPINE FINDINGS  The images through the lumbar spine are moderately motion degraded. There is a large sclerotic lesion nearly replacing the L2 vertebral body. This demonstrates increased uptake on today's bone scan. There are additional smaller lesions throughout all of the other lumbar vertebral bodies and upper sacrum. There is no evidence of pathologic fracture or epidural tumor.  The lumbar spinal canal is relatively small on a congenital basis. The conus medullaris extends to the L1-2 disc space level and appears normal.  There are scattered prominent retroperitoneal lymph nodes, best seen on the sagittal images. There is ectasia of the distal abdominal aorta which measures up to 3.5 cm in AP diameter.  L1-2: Mild disc bulging without significant resulting spinal stenosis or nerve root encroachment.  L2-3: Mild disc bulging. No significant spinal stenosis or nerve root encroachment.  L3-4: There is moderate disc bulging with mild facet and ligamentous hypertrophy. These factors contribute to mild spinal and lateral recess stenosis bilaterally. The foramina appear sufficiently patent.  L4-5: Mild disc bulging with mild facet and ligamentous hypertrophy. No significant spinal stenosis or nerve root encroachment.  L5-S1: There is chronic disc degeneration with annular disc bulging and paraspinal osteophytes. Mild facet degenerative changes are present bilaterally. There is mild narrowing of both foramina.  IMPRESSION: 1. As demonstrated on recent studies, there are multiple new sclerotic lesions throughout the thoracolumbar spine, largest at L2. The L2 lesion demonstrates uptake  on today's bone scan. Again, these findings remain suspicious for blastic metastatic disease, possibly treated. 2. No evidence of pathologic fracture or epidural tumor. 3. Prominent retroperitoneal lymph nodes, also potentially indicating metastatic disease. 4. Relatively mild multilevel lumbar spondylosis with mild multifactorial spinal stenosis at L3-4. 5. Ectasia of the abdominal aorta.   Electronically Signed   By: Camie Patience M.D.   On: 04/24/2013 17:10  Mr Lumbar Spine Wo Contrast  04/24/2013   CLINICAL DATA:  Unexplained sclerotic lesions in the spine. History of prostate cancer with slowly rising serum PSA levels.  EXAM: MRI THORACIC AND LUMBAR SPINE WITHOUT CONTRAST  TECHNIQUE: Multiplanar and multiecho pulse sequences of the thoracic and lumbar spine were obtained without intravenous contrast.  COMPARISON:  NM BONE WHOLE BODY dated 04/24/2013; CT CHEST W/O CM dated 04/22/2013; CT ANGIO CHEST W/CM &/OR WO/CM dated 09/23/2010  FINDINGS: MR THORACIC SPINE FINDINGS  Sagittal localizing images of the cervical spine demonstrate no focal lesions or malalignment.  As demonstrated on the recent chest CT, there are several sclerotic lesions within the thoracic spine, demonstrating uniformly low T1 and T2 signal. There is no surrounding marrow edema. Lesions are noted at T2, T5, T7, T8, T9 and T10. There are hemangiomas at T6 and T10. There is no evidence of pathologic fracture or epidural tumor.  The thoracic cord is normal in signal and caliber. There is no disc herniation, spinal stenosis or nerve root encroachment.  Small bilateral pleural effusions and chronic lung disease are similar to the recent chest CT.  MR LUMBAR SPINE FINDINGS  The images through the lumbar spine are moderately motion degraded. There is a large sclerotic lesion nearly replacing the L2 vertebral body. This demonstrates increased uptake on today's bone scan. There are additional smaller lesions throughout all of the other lumbar vertebral  bodies and upper sacrum. There is no evidence of pathologic fracture or epidural tumor.  The lumbar spinal canal is relatively small on a congenital basis. The conus medullaris extends to the L1-2 disc space level and appears normal.  There are scattered prominent retroperitoneal lymph nodes, best seen on the sagittal images. There is ectasia of the distal abdominal aorta which measures up to 3.5 cm in AP diameter.  L1-2: Mild disc bulging without significant resulting spinal stenosis or nerve root encroachment.  L2-3: Mild disc bulging. No significant spinal stenosis or nerve root encroachment.  L3-4: There is moderate disc bulging with mild facet and ligamentous hypertrophy. These factors contribute to mild spinal and lateral recess stenosis bilaterally. The foramina appear sufficiently patent.  L4-5: Mild disc bulging with mild facet and ligamentous hypertrophy. No significant spinal stenosis or nerve root encroachment.  L5-S1: There is chronic disc degeneration with annular disc bulging and paraspinal osteophytes. Mild facet degenerative changes are present bilaterally. There is mild narrowing of both foramina.  IMPRESSION: 1. As demonstrated on recent studies, there are multiple new sclerotic lesions throughout the thoracolumbar spine, largest at L2. The L2 lesion demonstrates uptake on today's bone scan. Again, these findings remain suspicious for blastic metastatic disease, possibly treated. 2. No evidence of pathologic fracture or epidural tumor. 3. Prominent retroperitoneal lymph nodes, also potentially indicating metastatic disease. 4. Relatively mild multilevel lumbar spondylosis with mild multifactorial spinal stenosis at L3-4. 5. Ectasia of the abdominal aorta.   Electronically Signed   By: Camie Patience M.D.   On: 04/24/2013 17:10   Nm Bone Scan Whole Body  04/24/2013   CLINICAL DATA:  History of prostate cancer with new sclerotic bone lesions on chest CT. Slowly rising PSA, 1.89 on 04/22/2013.  EXAM:  NUCLEAR MEDICINE WHOLE BODY BONE SCAN  TECHNIQUE: Whole body anterior and posterior images were obtained approximately 3 hours after intravenous injection of radiopharmaceutical.  COMPARISON:  CT CHEST W/O CM dated 04/22/2013; CT ANGIO CHEST W/CM &/OR WO/CM dated 09/23/2010  RADIOPHARMACEUTICALS:  25.0 mCi Technetium-99 MDP  FINDINGS: There is lower right thoracic  paraspinal activity, corresponding with paraspinal osteophytes on CT. I do not appreciate focally increased activity within the thoracic vertebral bodies to correspond with the sclerotic lesions on CT. There is diffusely increased activity within the L2 vertebral body, best seen on the posterior images. The spinal activity is otherwise unremarkable.  There is no abnormal activity within the ribs, pelvis or extremities.  IMPRESSION: 1. There is no definite abnormal activity within the thoracic spine to correspond with the new sclerotic lesions on CT. There is lower right paraspinal activity corresponding with osteophytes on CT. 2. There is prominent activity within the L2 vertebral body, not covered on the recent chest CT. This could reflect a metastasis. Correlation with lumbar spine radiographs is recommended. 3. Because the thoracic spine sclerotic lesions are new, they remain suspicious for blastic metastatic disease, possibly treated given the absence of bone scan activity.   Electronically Signed   By: Camie Patience M.D.   On: 04/24/2013 11:27   Ct Biopsy  04/27/2013   CLINICAL DATA:  80 year old with sclerotic bone lesions of unknown etiology.  EXAM: CT-GUIDED BIOPSY OF L2 VERTEBRAL BODY LESION  Physician: Stephan Minister. Anselm Pancoast, MD  MEDICATIONS: 1 mg versed, 100 mcg fentanyl. A radiology nurse monitored the patient for moderate sedation.  ANESTHESIA/SEDATION: Moderate sedation time: 30 min  PROCEDURE: Informed consent was obtained for a CT-guided bone biopsy. Patient was placed prone. Images through the lumbar spine were obtained. Sclerotic lesion along the left  side of the L2 vertebral body was identified. The left side of the back was prepped and draped in a sterile fashion. The skin was anesthetized with 1% lidocaine. A spinal needle was directed onto the L2 vertebral body. A 13 gauge bone needle was directed into the left side of the L2 vertebral body from a transpedicular approach. Needle was placed along the proximal aspect of the pedicle. Two core biopsies were obtained with the biopsy needle. A third core biopsy was attempted with the 13 gauge needle but no significant tissue was yielded from this needle. Small bandage was placed over the puncture site.  COMPLICATIONS: None  FINDINGS: Sclerotic lesion involving the left side of the L2 vertebral body. Needle was positioned along the posterior aspect of the lesion and 2 suitable core biopsies were obtained.  IMPRESSION: CT-guided core biopsies of the L2 sclerotic bone lesion.   Electronically Signed   By: Markus Daft M.D.   On: 04/27/2013 17:53    Microbiology: No results found for this or any previous visit (from the past 240 hour(s)).   Labs: Basic Metabolic Panel:  Recent Labs Lab 04/21/13 2255 04/22/13 1801 04/23/13 0535 04/26/13 0545  NA  --   --  138 136*  K  --   --  4.1 3.8  CL  --   --  102 99  CO2  --   --  23 23  GLUCOSE  --   --  219* 246*  BUN  --   --  67* 58*  CREATININE  --  2.67* 2.66* 2.93*  CALCIUM  --   --  8.2* 8.5  MG 2.3  --   --   --    Liver Function Tests:  Recent Labs Lab 04/21/13 1405  PROT 5.6*   No results found for this basename: LIPASE, AMYLASE,  in the last 168 hours No results found for this basename: AMMONIA,  in the last 168 hours CBC:  Recent Labs Lab 04/23/13 0535 04/24/13 0555 04/25/13 0530 04/26/13 1004 04/27/13  0840  WBC 10.1 8.9 7.8 7.5 9.8  NEUTROABS  --   --  6.1  --  7.8*  HGB 8.5* 8.2* 8.0* 8.5* 9.0*  HCT 26.1* 25.7* 25.9* 27.7* 28.9*  MCV 90.0 88.9 90.2 89.6 88.7  PLT 217 242 254 281 316   Cardiac Enzymes: No results found  for this basename: CKTOTAL, CKMB, CKMBINDEX, TROPONINI,  in the last 168 hours BNP: BNP (last 3 results)  Recent Labs  04/03/13 1155 04/20/13 1116  PROBNP 893.8* 1139.0*   CBG:  Recent Labs Lab 04/27/13 1129 04/27/13 1547 04/27/13 1704 04/27/13 2215 04/28/13 0737  GLUCAP 220* 135* 119* 210* 137*       Signed:  Rowe Clack N  [facility] 04/28/2013, 9:49 AM

## 2013-04-28 NOTE — Progress Notes (Signed)
Discharge instructions accompanied pt, left the unit in stable conditIon. Transported via ambulance to SNF.

## 2013-04-29 LAB — KAPPA/LAMBDA LIGHT CHAINS
Kappa free light chain: 9.06 mg/dL — ABNORMAL HIGH (ref 0.33–1.94)
Kappa, lambda light chain ratio: 1.09 (ref 0.26–1.65)
LAMDA FREE LIGHT CHAINS: 8.3 mg/dL — AB (ref 0.57–2.63)

## 2013-05-01 ENCOUNTER — Other Ambulatory Visit: Payer: Self-pay | Admitting: Oncology

## 2013-05-01 DIAGNOSIS — D509 Iron deficiency anemia, unspecified: Secondary | ICD-10-CM

## 2013-05-01 LAB — MISCELLANEOUS TEST

## 2013-05-05 ENCOUNTER — Encounter: Payer: Self-pay | Admitting: Internal Medicine

## 2013-05-05 ENCOUNTER — Telehealth: Payer: Self-pay | Admitting: Medical Oncology

## 2013-05-05 NOTE — Telephone Encounter (Signed)
Call to patient's home, spoke with daughter, [name] and confirmed tomorrows appt. Daughter states patient will be coming from [facility] rehab and she will meet patient here to be present during appt.

## 2013-05-06 ENCOUNTER — Other Ambulatory Visit (HOSPITAL_BASED_OUTPATIENT_CLINIC_OR_DEPARTMENT_OTHER): Payer: Medicare PPO

## 2013-05-06 ENCOUNTER — Ambulatory Visit (HOSPITAL_BASED_OUTPATIENT_CLINIC_OR_DEPARTMENT_OTHER): Payer: Medicare PPO | Admitting: Oncology

## 2013-05-06 ENCOUNTER — Encounter: Payer: Self-pay | Admitting: Oncology

## 2013-05-06 ENCOUNTER — Ambulatory Visit: Payer: Medicare PPO

## 2013-05-06 ENCOUNTER — Telehealth: Payer: Self-pay | Admitting: Oncology

## 2013-05-06 VITALS — BP 144/55 | HR 88 | Temp 98.3°F | Resp 18 | Ht 70.5 in | Wt 237.5 lb

## 2013-05-06 DIAGNOSIS — C61 Malignant neoplasm of prostate: Secondary | ICD-10-CM

## 2013-05-06 DIAGNOSIS — N039 Chronic nephritic syndrome with unspecified morphologic changes: Secondary | ICD-10-CM

## 2013-05-06 DIAGNOSIS — D638 Anemia in other chronic diseases classified elsewhere: Secondary | ICD-10-CM | POA: Insufficient documentation

## 2013-05-06 DIAGNOSIS — Z8546 Personal history of malignant neoplasm of prostate: Secondary | ICD-10-CM | POA: Insufficient documentation

## 2013-05-06 DIAGNOSIS — D509 Iron deficiency anemia, unspecified: Secondary | ICD-10-CM

## 2013-05-06 DIAGNOSIS — N189 Chronic kidney disease, unspecified: Secondary | ICD-10-CM

## 2013-05-06 DIAGNOSIS — D631 Anemia in chronic kidney disease: Secondary | ICD-10-CM

## 2013-05-06 DIAGNOSIS — N183 Chronic kidney disease, stage 3 unspecified: Secondary | ICD-10-CM

## 2013-05-06 LAB — COMPREHENSIVE METABOLIC PANEL (CC13)
ALT: 27 U/L (ref 0–55)
ANION GAP: 11 meq/L (ref 3–11)
AST: 20 U/L (ref 5–34)
Albumin: 2.6 g/dL — ABNORMAL LOW (ref 3.5–5.0)
Alkaline Phosphatase: 96 U/L (ref 40–150)
BUN: 50 mg/dL — AB (ref 7.0–26.0)
CALCIUM: 8.5 mg/dL (ref 8.4–10.4)
CHLORIDE: 109 meq/L (ref 98–109)
CO2: 23 meq/L (ref 22–29)
Creatinine: 2.4 mg/dL — ABNORMAL HIGH (ref 0.7–1.3)
Glucose: 301 mg/dl — ABNORMAL HIGH (ref 70–140)
Potassium: 4 mEq/L (ref 3.5–5.1)
Sodium: 142 mEq/L (ref 136–145)
Total Bilirubin: 0.23 mg/dL (ref 0.20–1.20)
Total Protein: 5.9 g/dL — ABNORMAL LOW (ref 6.4–8.3)

## 2013-05-06 LAB — CBC WITH DIFFERENTIAL/PLATELET
BASO%: 1.2 % (ref 0.0–2.0)
BASOS ABS: 0.1 10*3/uL (ref 0.0–0.1)
EOS%: 5 % (ref 0.0–7.0)
Eosinophils Absolute: 0.4 10*3/uL (ref 0.0–0.5)
HEMATOCRIT: 23.7 % — AB (ref 38.4–49.9)
HEMOGLOBIN: 7.3 g/dL — AB (ref 13.0–17.1)
LYMPH#: 0.5 10*3/uL — AB (ref 0.9–3.3)
LYMPH%: 6.9 % — ABNORMAL LOW (ref 14.0–49.0)
MCH: 26.9 pg — AB (ref 27.2–33.4)
MCHC: 30.6 g/dL — ABNORMAL LOW (ref 32.0–36.0)
MCV: 87.9 fL (ref 79.3–98.0)
MONO#: 0.7 10*3/uL (ref 0.1–0.9)
MONO%: 8.8 % (ref 0.0–14.0)
NEUT%: 78.1 % — AB (ref 39.0–75.0)
NEUTROS ABS: 6 10*3/uL (ref 1.5–6.5)
Platelets: 343 10*3/uL (ref 140–400)
RBC: 2.7 10*6/uL — ABNORMAL LOW (ref 4.20–5.82)
RDW: 19.3 % — AB (ref 11.0–14.6)
WBC: 7.6 10*3/uL (ref 4.0–10.3)

## 2013-05-06 MED ORDER — DARBEPOETIN ALFA-POLYSORBATE 300 MCG/0.6ML IJ SOLN
300.0000 ug | Freq: Once | INTRAMUSCULAR | Status: AC
  Administered 2013-05-06: 300 ug via SUBCUTANEOUS
  Filled 2013-05-06: qty 0.6
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
## 2013-05-06 NOTE — Progress Notes (Signed)
Please see consult note.  

## 2013-05-06 NOTE — Patient Instructions (Signed)
Darbepoetin Alfa injection What is this medicine? DARBEPOETIN ALFA (dar be POE e tin AL fa) helps your body make more red blood cells. It is used to treat anemia caused by chronic kidney failure and chemotherapy. This medicine may be used for other purposes; ask your health care provider or pharmacist if you have questions. COMMON BRAND NAME(S): Aranesp What should I tell my health care provider before I take this medicine? They need to know if you have any of these conditions: -blood clotting disorders or history of blood clots -cancer patient not on chemotherapy -cystic fibrosis -heart disease, such as angina, heart failure, or a history of a heart attack -hemoglobin level of 12 g/dL or greater -high blood pressure -low levels of folate, iron, or vitamin B12 -seizures -an unusual or allergic reaction to darbepoetin, erythropoietin, albumin, hamster proteins, latex, other medicines, foods, dyes, or preservatives -pregnant or trying to get pregnant -breast-feeding How should I use this medicine? This medicine is for injection into a vein or under the skin. It is usually given by a health care professional in a hospital or clinic setting. If you get this medicine at home, you will be taught how to prepare and give this medicine. Do not shake the solution before you withdraw a dose. Use exactly as directed. Take your medicine at regular intervals. Do not take your medicine more often than directed. It is important that you put your used needles and syringes in a special sharps container. Do not put them in a trash can. If you do not have a sharps container, call your pharmacist or healthcare provider to get one. Talk to your pediatrician regarding the use of this medicine in children. While this medicine may be used in children as young as 1 year for selected conditions, precautions do apply. Overdosage: If you think you have taken too much of this medicine contact a poison control center or  emergency room at once. NOTE: This medicine is only for you. Do not share this medicine with others. What if I miss a dose? If you miss a dose, take it as soon as you can. If it is almost time for your next dose, take only that dose. Do not take double or extra doses. What may interact with this medicine? Do not take this medicine with any of the following medications: -epoetin alfa This list may not describe all possible interactions. Give your health care provider a list of all the medicines, herbs, non-prescription drugs, or dietary supplements you use. Also tell them if you smoke, drink alcohol, or use illegal drugs. Some items may interact with your medicine. What should I watch for while using this medicine? Visit your prescriber or health care professional for regular checks on your progress and for the needed blood tests and blood pressure measurements. It is especially important for the doctor to make sure your hemoglobin level is in the desired range, to limit the risk of potential side effects and to give you the best benefit. Keep all appointments for any recommended tests. Check your blood pressure as directed. Ask your doctor what your blood pressure should be and when you should contact him or her. As your body makes more red blood cells, you may need to take iron, folic acid, or vitamin B supplements. Ask your doctor or health care provider which products are right for you. If you have kidney disease continue dietary restrictions, even though this medication can make you feel better. Talk with your doctor or health   care professional about the foods you eat and the vitamins that you take. What side effects may I notice from receiving this medicine? Side effects that you should report to your doctor or health care professional as soon as possible: -allergic reactions like skin rash, itching or hives, swelling of the face, lips, or tongue -breathing problems -changes in vision -chest  pain -confusion, trouble speaking or understanding -feeling faint or lightheaded, falls -high blood pressure -muscle aches or pains -pain, swelling, warmth in the leg -rapid weight gain -severe headaches -sudden numbness or weakness of the face, arm or leg -trouble walking, dizziness, loss of balance or coordination -seizures (convulsions) -swelling of the ankles, feet, hands -unusually weak or tired Side effects that usually do not require medical attention (report to your doctor or health care professional if they continue or are bothersome): -diarrhea -fever, chills (flu-like symptoms) -headaches -nausea, vomiting -redness, stinging, or swelling at site where injected This list may not describe all possible side effects. Call your doctor for medical advice about side effects. You may report side effects to FDA at 1-800-FDA-1088. Where should I keep my medicine? Keep out of the reach of children. Store in a refrigerator between 2 and 8 degrees C (36 and 46 degrees F). Do not freeze. Do not shake. Throw away any unused portion if using a single-dose vial. Throw away any unused medicine after the expiration date. NOTE: This sheet is a summary. It may not cover all possible information. If you have questions about this medicine, talk to your doctor, pharmacist, or health care provider.  2014, Elsevier/Gold Standard. (2008-01-20 10:23:57)  

## 2013-05-06 NOTE — Progress Notes (Signed)
Checked in new pt with no financial concerns. °

## 2013-05-06 NOTE — Telephone Encounter (Signed)
Gave pt appt for lab,md and injectoins for April 2015

## 2013-05-13 NOTE — Progress Notes (Signed)
I WENT ONLINE TO GET HIS ARANESP APPROVED.  DR. Casimer Bilis RUBIN CALLED BACK AND SAID THAT WE CAN'T START WITH 300000 MCG.  [name] DID A PEER TO PEER WITH DR RUBIN AND HE WILL APPROVE 100000 MCG.  HE DID GO AHEAD AND OK THE 300000 MCG THAT WE HAD ALREADY GIVEN.

## 2013-05-14 ENCOUNTER — Other Ambulatory Visit: Payer: Self-pay

## 2013-05-14 MED ORDER — ALLOPURINOL 100 MG PO TABS: 100.0000 mg | ORAL_TABLET | Freq: Every day | ORAL | Status: DC

## 2013-05-21 ENCOUNTER — Encounter: Payer: Self-pay | Admitting: Internal Medicine

## 2013-05-21 ENCOUNTER — Ambulatory Visit (INDEPENDENT_AMBULATORY_CARE_PROVIDER_SITE_OTHER): Payer: Medicare PPO | Admitting: Internal Medicine

## 2013-05-21 VITALS — BP 150/58 | HR 90 | Temp 98.2°F | Ht 70.0 in | Wt 240.4 lb

## 2013-05-21 DIAGNOSIS — D631 Anemia in chronic kidney disease: Secondary | ICD-10-CM

## 2013-05-21 DIAGNOSIS — N183 Chronic kidney disease, stage 3 unspecified: Secondary | ICD-10-CM

## 2013-05-21 DIAGNOSIS — N039 Chronic nephritic syndrome with unspecified morphologic changes: Secondary | ICD-10-CM

## 2013-05-21 DIAGNOSIS — I10 Essential (primary) hypertension: Secondary | ICD-10-CM

## 2013-05-21 DIAGNOSIS — I5032 Chronic diastolic (congestive) heart failure: Secondary | ICD-10-CM

## 2013-05-21 DIAGNOSIS — IMO0001 Reserved for inherently not codable concepts without codable children: Secondary | ICD-10-CM

## 2013-05-21 DIAGNOSIS — E1165 Type 2 diabetes mellitus with hyperglycemia: Secondary | ICD-10-CM

## 2013-05-21 NOTE — Patient Instructions (Signed)
OK to increase the torsemide to 2 pills in the AM every day, but ALSO 2 pills in the afternoon every OTHER day  Please continue all other medications as before, and refills have been done if requested. Please have the pharmacy call with any other refills you may need.  Please keep your appointments with your specialists as you have planned  - Dr Osker Mason  Please return in 2 months, or sooner if needed

## 2013-05-21 NOTE — Progress Notes (Signed)
Subjective:    Patient ID: [name], male    DOB: 1932-05-22, 80 y.o.   MRN: [id number]  HPI  Here with daughter; s/p recent hospn and rehab stay, Here to f/u; overall doing ok,  Pt denies chest pain, increased sob or doe, wheezing, orthopnea, PND, increased LE swelling, palpitations, dizziness or syncope.  Pt denies polydipsia, polyuria, or low sugar symptoms such as weakness or confusion improved with po intake.  Pt denies new neurological symptoms such as new headache, or facial or extremity weakness or numbness.   Pt states overall good compliance with meds, has been trying to follow lower cholesterol, diabetic diet, with wt overall stable,  but little exercise however.  Walks with walker.   Now on torsemide 40 qam only, has gained several lbs.  Was on 40 bid but overdiuresed so decreased. Past Medical History  Diagnosis Date  . Chronic diastolic heart failure     secondary to diastolic dysfunction EF (previous EF 35-45%)ef 60%4/09  . Arrhythmia     atrial fibrillation /pt on amiodarone,thought to be poor coumadin  . Stroke   . Other and unspecified hyperlipidemia   . Hypertension   . Hypertrophy of prostate without urinary obstruction and other lower urinary tract symptoms (LUTS)   . Osteoarthrosis, unspecified whether generalized or localized, unspecified site   . Type II or unspecified type diabetes mellitus without mention of complication, not stated as uncontrolled   . AV block, Mobitz 1     Intolerance to ACE's / discontiuation of beta blockers  . Obstructive sleep apnea   . Iron deficiency anemia, unspecified     s/p EGD and coloscopy 3/10.gastritis.hemorrhids  . Cerebrovascular accident   . Renal insufficiency   . Obesity   . Allergic rhinitis, cause unspecified 05/29/2010  . CAD (coronary artery disease)     cath 12/04: mLAD 50-60%, mCFX 20-30%, pRCA 50-60%, mRCA 40%  . Normochromic normocytic anemia 04/21/2013  . Abnormal CXR 04/21/2013  . Hypertensive kidney disease  with CKD stage III 04/21/2013  . Prostate cancer    Past Surgical History  Procedure Laterality Date  . Prostate surgery    . Prostate surgury      hx of  . Esophagogastroduodenoscopy N/A 04/21/2013    Procedure: ESOPHAGOGASTRODUODENOSCOPY (EGD);  Surgeon: Irene Shipper, MD;  Location: [facility];  Service: Endoscopy;  Laterality: N/A;    reports that he has quit smoking. He has never used smokeless tobacco. He reports that he does not drink alcohol or use illicit drugs. family history includes Heart disease in his father. Allergies  Allergen Reactions  . Ace Inhibitors Other (See Comments)    me  hyposion  . Beta Adrenergic Blockers Other (See Comments)     use cautiously secondary to 2nd degree heart block, hypotension   Current Outpatient Prescriptions on File Prior to Visit  Medication Sig Dispense Refill  . allopurinol (ZYLOPRIM) 100 MG tablet Take 1 tablet (100 mg total) by mouth at bedtime.  30 tablet  3  . amiodarone (PACERONE) 200 MG tablet Take 0.5 tablets (100 mg total) by mouth every morning.  15 tablet  6  . amLODipine (NORVASC) 10 MG tablet Take 1 tablet (10 mg total) by mouth daily.  30 tablet  6  . aspirin EC 81 MG EC tablet Take 1 tablet (81 mg total) by mouth daily.      Marland Kitchen atorvastatin (LIPITOR) 40 MG tablet TAKE 1 TABLET BY MOUTH EVERY DAY  30 tablet  6  .  citalopram (CELEXA) 10 MG tablet Take 10 mg by mouth 2 (two) times daily.      . cloNIDine (CATAPRES - DOSED IN MG/24 HR) 0.1 mg/24hr patch Place 1 patch (0.1 mg total) onto the skin once a week. Change on sundays  4 patch  6  . Fe Fum-Vit C-Vit B12-FA (TRIGELS-F) 460-60-0.01-1 MG CAPS capsule Take 1 capsule by mouth daily.  30 capsule  6  . Fluticasone-Salmeterol (ADVAIR DISKUS) 100-50 MCG/DOSE AEPB Inhale 1 puff into the lungs 2 (two) times daily.  1 each  0  . hydrALAZINE (APRESOLINE) 100 MG tablet TAKE 1 TABLET BY MOUTH 3 TIMES A DAY  90 tablet  6  . insulin detemir (LEVEMIR) 100 UNIT/ML injection Inject 125  Units into the skin every morning.      . isosorbide mononitrate (IMDUR) 60 MG 24 hr tablet Take 1 tablet (60 mg total) by mouth daily.  90 tablet  3  . levofloxacin (LEVAQUIN) 750 MG tablet Take 1 tablet (750 mg total) by mouth every other day.  3 tablet  0  . levothyroxine (SYNTHROID, LEVOTHROID) 125 MCG tablet TAKE 1 TABLET BY MOUTH ONCE EVERY DAY  30 tablet  11  . mupirocin ointment (BACTROBAN) 2 % Use qd with dressing change  30 g  0  . omeprazole (PRILOSEC) 20 MG capsule Take 2 capsules (40 mg total) by mouth daily.  30 capsule  11  . potassium chloride SA (K-DUR,KLOR-CON) 20 MEQ tablet Take 1 tablet (20 mEq total) by mouth daily.  30 tablet  6  . torsemide (DEMADEX) 20 MG tablet Take 60 mg by mouth 2 (two) times daily. Takes 2 tablets 2 times a day.       No current facility-administered medications on file prior to visit.    Review of Systems  Constitutional: Negative for unexpected weight change, or unusual diaphoresis  HENT: Negative for tinnitus.   Eyes: Negative for photophobia and visual disturbance.  Respiratory: Negative for choking and stridor.   Gastrointestinal: Negative for vomiting and blood in stool.  Genitourinary: Negative for hematuria and decreased urine volume.  Musculoskeletal: Negative for acute joint swelling Skin: Negative for color change and wound.  Neurological: Negative for tremors and numbness other than noted  Psychiatric/Behavioral: Negative for decreased concentration or  hyperactivity.       Objective:   Physical Exam BP 150/58  Pulse 90  Temp(Src) 98.2 F (36.8 C) (Oral)  Ht 5\' 10"  (1.778 m)  Wt 240 lb 6 oz (109.033 kg)  BMI 34.49 kg/m2  SpO2 88% VS noted,  Constitutional: Pt appears well-developed and well-nourished.  HENT: Head: NCAT.  Right Ear: External ear normal.  Left Ear: External ear normal.  Eyes: Conjunctivae and EOM are normal. Pupils are equal, round, and reactive to light.  Neck: Normal range of motion. Neck supple.    Cardiovascular: Normal rate and regular rhythm.   Pulmonary/Chest: Effort normal and breath sounds with few bibas rales.  Abd:  Soft, NT, non-distended, + BS Neurological: Pt is alert. Not confused  Skin: Skin is warm. No erythema. trace edema bilat LE's to knees Psychiatric: Pt behavior is normal. Thought content normal.   Ambulatory o2 sat - 95% at rest, then 90% with walking with walker 50 ft    Assessment & Plan:

## 2013-05-21 NOTE — Progress Notes (Signed)
Pre visit review using our clinic review tool, if applicable. No additional management support is needed unless otherwise documented below in the visit note. 

## 2013-05-22 MED ORDER — TORSEMIDE 20 MG PO TABS: ORAL_TABLET | ORAL | Status: DC

## 2013-05-22 NOTE — Assessment & Plan Note (Signed)
stable overall by history and exam, recent data reviewed with pt, and pt to continue medical treatment as before,  to f/u any worsening symptoms or concerns BP Readings from Last 3 Encounters:  05/21/13 150/58  05/06/13 144/55  04/28/13 128/55

## 2013-05-22 NOTE — Assessment & Plan Note (Signed)
stable overall by history and exam, recent data reviewed with pt, and pt to continue medical treatment as before,  to f/u any worsening symptoms or concerns Lab Results  Component Value Date   WBC 7.6 05/06/2013   HGB 7.3* 05/06/2013   HCT 23.7* 05/06/2013   PLT 343 05/06/2013   GLUCOSE 301* 05/06/2013   CHOL 118 08/12/2012   TRIG 134.0 08/12/2012   HDL 35.10* 08/12/2012   LDLDIRECT 83.9 09/19/2009   LDLCALC 56 08/12/2012   ALT 27 05/06/2013   AST 20 05/06/2013   NA 142 05/06/2013   K 4.0 05/06/2013   CL 99 04/26/2013   CREATININE 2.4* 05/06/2013   BUN 50.0* 05/06/2013   CO2 23 05/06/2013   TSH 2.33 09/21/2010   PSA 1.89 04/22/2013   INR 1.06 04/20/2013   HGBA1C 7.4* 08/12/2012   MICROALBUR 56.6* 11/27/2010   For f/u lab

## 2013-05-22 NOTE — Assessment & Plan Note (Addendum)
For change torsemide from 40 qam only, to 40 qam, with 40 in afternoon every other day, daughter states can handle this.

## 2013-05-25 ENCOUNTER — Telehealth: Payer: Self-pay | Admitting: Internal Medicine

## 2013-05-25 NOTE — Telephone Encounter (Signed)
Relevant patient education mailed to patient.  

## 2013-05-27 ENCOUNTER — Ambulatory Visit (HOSPITAL_BASED_OUTPATIENT_CLINIC_OR_DEPARTMENT_OTHER): Payer: Medicare PPO

## 2013-05-27 ENCOUNTER — Other Ambulatory Visit (HOSPITAL_BASED_OUTPATIENT_CLINIC_OR_DEPARTMENT_OTHER): Payer: Medicare PPO

## 2013-05-27 VITALS — BP 147/50 | HR 67 | Temp 98.5°F

## 2013-05-27 DIAGNOSIS — N183 Chronic kidney disease, stage 3 unspecified: Secondary | ICD-10-CM

## 2013-05-27 DIAGNOSIS — N039 Chronic nephritic syndrome with unspecified morphologic changes: Principal | ICD-10-CM

## 2013-05-27 DIAGNOSIS — D631 Anemia in chronic kidney disease: Secondary | ICD-10-CM

## 2013-05-27 LAB — CBC WITH DIFFERENTIAL/PLATELET
BASO%: 0.1 % (ref 0.0–2.0)
Basophils Absolute: 0 10*3/uL (ref 0.0–0.1)
EOS%: 4.9 % (ref 0.0–7.0)
Eosinophils Absolute: 0.3 10*3/uL (ref 0.0–0.5)
HCT: 31.4 % — ABNORMAL LOW (ref 38.4–49.9)
HGB: 9.8 g/dL — ABNORMAL LOW (ref 13.0–17.1)
LYMPH%: 10 % — AB (ref 14.0–49.0)
MCH: 26.8 pg — ABNORMAL LOW (ref 27.2–33.4)
MCHC: 31.1 g/dL — AB (ref 32.0–36.0)
MCV: 86.2 fL (ref 79.3–98.0)
MONO#: 0.7 10*3/uL (ref 0.1–0.9)
MONO%: 10.3 % (ref 0.0–14.0)
NEUT#: 4.9 10*3/uL (ref 1.5–6.5)
NEUT%: 74.7 % (ref 39.0–75.0)
PLATELETS: 332 10*3/uL (ref 140–400)
RBC: 3.65 10*6/uL — ABNORMAL LOW (ref 4.20–5.82)
RDW: 16.6 % — AB (ref 11.0–14.6)
WBC: 6.6 10*3/uL (ref 4.0–10.3)
lymph#: 0.7 10*3/uL — ABNORMAL LOW (ref 0.9–3.3)

## 2013-05-27 LAB — HOLD TUBE, BLOOD BANK

## 2013-05-27 MED ORDER — DARBEPOETIN ALFA-POLYSORBATE 300 MCG/0.6ML IJ SOLN
300.0000 ug | Freq: Once | INTRAMUSCULAR | Status: AC
  Administered 2013-05-27: 300 ug via SUBCUTANEOUS
  Filled 2013-05-27: qty 0.6

## 2013-06-03 ENCOUNTER — Ambulatory Visit: Payer: Medicare PPO | Admitting: Internal Medicine

## 2013-06-10 ENCOUNTER — Ambulatory Visit (HOSPITAL_BASED_OUTPATIENT_CLINIC_OR_DEPARTMENT_OTHER): Payer: Medicare PPO

## 2013-06-10 ENCOUNTER — Telehealth: Payer: Self-pay | Admitting: Oncology

## 2013-06-10 ENCOUNTER — Telehealth: Payer: Self-pay | Admitting: *Deleted

## 2013-06-10 ENCOUNTER — Encounter: Payer: Self-pay | Admitting: Oncology

## 2013-06-10 ENCOUNTER — Other Ambulatory Visit (HOSPITAL_BASED_OUTPATIENT_CLINIC_OR_DEPARTMENT_OTHER): Payer: Medicare PPO

## 2013-06-10 ENCOUNTER — Ambulatory Visit (HOSPITAL_BASED_OUTPATIENT_CLINIC_OR_DEPARTMENT_OTHER): Payer: Medicare PPO | Admitting: Oncology

## 2013-06-10 VITALS — BP 155/66 | HR 76 | Temp 97.8°F | Resp 18 | Ht 70.0 in | Wt 238.9 lb

## 2013-06-10 DIAGNOSIS — D631 Anemia in chronic kidney disease: Secondary | ICD-10-CM

## 2013-06-10 DIAGNOSIS — D509 Iron deficiency anemia, unspecified: Secondary | ICD-10-CM

## 2013-06-10 DIAGNOSIS — N183 Chronic kidney disease, stage 3 unspecified: Secondary | ICD-10-CM

## 2013-06-10 DIAGNOSIS — N189 Chronic kidney disease, unspecified: Secondary | ICD-10-CM

## 2013-06-10 DIAGNOSIS — Z8546 Personal history of malignant neoplasm of prostate: Secondary | ICD-10-CM

## 2013-06-10 DIAGNOSIS — N039 Chronic nephritic syndrome with unspecified morphologic changes: Principal | ICD-10-CM

## 2013-06-10 LAB — CBC WITH DIFFERENTIAL/PLATELET
BASO%: 0.5 % (ref 0.0–2.0)
Basophils Absolute: 0 10*3/uL (ref 0.0–0.1)
EOS%: 2.6 % (ref 0.0–7.0)
Eosinophils Absolute: 0.3 10*3/uL (ref 0.0–0.5)
HCT: 31.3 % — ABNORMAL LOW (ref 38.4–49.9)
HGB: 9.8 g/dL — ABNORMAL LOW (ref 13.0–17.1)
LYMPH%: 4.8 % — AB (ref 14.0–49.0)
MCH: 26.7 pg — ABNORMAL LOW (ref 27.2–33.4)
MCHC: 31.3 g/dL — AB (ref 32.0–36.0)
MCV: 85.4 fL (ref 79.3–98.0)
MONO#: 0.9 10*3/uL (ref 0.1–0.9)
MONO%: 9.1 % (ref 0.0–14.0)
NEUT#: 8.5 10*3/uL — ABNORMAL HIGH (ref 1.5–6.5)
NEUT%: 83 % — ABNORMAL HIGH (ref 39.0–75.0)
Platelets: 333 10*3/uL (ref 140–400)
RBC: 3.67 10*6/uL — AB (ref 4.20–5.82)
RDW: 17.5 % — AB (ref 11.0–14.6)
WBC: 10.3 10*3/uL (ref 4.0–10.3)
lymph#: 0.5 10*3/uL — ABNORMAL LOW (ref 0.9–3.3)

## 2013-06-10 LAB — HOLD TUBE, BLOOD BANK

## 2013-06-10 MED ORDER — DARBEPOETIN ALFA-POLYSORBATE 300 MCG/0.6ML IJ SOLN
300.0000 ug | Freq: Once | INTRAMUSCULAR | Status: AC
  Administered 2013-06-10: 300 ug via SUBCUTANEOUS
  Filled 2013-06-10: qty 0.6

## 2013-06-10 NOTE — Telephone Encounter (Signed)
Per staff message and POF I have scheduled appts. I have advised the scheduler to move the lab appt JMW

## 2013-06-10 NOTE — Progress Notes (Signed)
Hematology and Oncology Follow Up Visit  [name] [id number] Nov 21, 1932 80 y.o. 06/10/2013 10:24 AM [name], MDJohn, Travis Oris, MD   Principle Diagnosis: 80 year old gentleman with multifactorial anemia he has an element of anemia of chronic disease as well as iron deficiency anemia. This was diagnosed in March of 2015.  Current therapy: Status post 300 mcg every 3 weeks to keep his hemoglobin above 11.  Interim History:  [name] presents today for a followup visit. He is a gentleman I saw in consultation in March 2015 for the evaluation of anemia. Since his last visit, he continued to do very well and improve clinically. He is able to ambulate with the help of a walker. He has not had any falls or illnesses. He has not reported any complications from Aranesp. Is ever any chest pain shortness of breath or difficulty breathing. He is not reporting any back pain shoulder pain or any bone pain at this time.  Medications: I have reviewed the patient's current medications.  Current Outpatient Prescriptions  Medication Sig Dispense Refill  . allopurinol (ZYLOPRIM) 100 MG tablet Take 1 tablet (100 mg total) by mouth at bedtime.  30 tablet  3  . amiodarone (PACERONE) 200 MG tablet Take 0.5 tablets (100 mg total) by mouth every morning.  15 tablet  6  . amLODipine (NORVASC) 10 MG tablet Take 1 tablet (10 mg total) by mouth daily.  30 tablet  6  . aspirin EC 81 MG EC tablet Take 1 tablet (81 mg total) by mouth daily.      Marland Kitchen atorvastatin (LIPITOR) 40 MG tablet TAKE 1 TABLET BY MOUTH EVERY DAY  30 tablet  6  . citalopram (CELEXA) 10 MG tablet Take 10 mg by mouth 2 (two) times daily.      . cloNIDine (CATAPRES - DOSED IN MG/24 HR) 0.1 mg/24hr patch Place 1 patch (0.1 mg total) onto the skin once a week. Change on sundays  4 patch  6  . Fe Fum-Vit C-Vit B12-FA (TRIGELS-F) 460-60-0.01-1 MG CAPS capsule Take 1 capsule by mouth daily.  30 capsule  6  . Fluticasone-Salmeterol (ADVAIR DISKUS) 100-50  MCG/DOSE AEPB Inhale 1 puff into the lungs 2 (two) times daily.  1 each  0  . hydrALAZINE (APRESOLINE) 100 MG tablet TAKE 1 TABLET BY MOUTH 3 TIMES A DAY  90 tablet  6  . insulin detemir (LEVEMIR) 100 UNIT/ML injection Inject 125 Units into the skin every morning.      . isosorbide mononitrate (IMDUR) 60 MG 24 hr tablet Take 1 tablet (60 mg total) by mouth daily.  90 tablet  3  . levofloxacin (LEVAQUIN) 750 MG tablet Take 1 tablet (750 mg total) by mouth every other day.  3 tablet  0  . levothyroxine (SYNTHROID, LEVOTHROID) 125 MCG tablet TAKE 1 TABLET BY MOUTH ONCE EVERY DAY  30 tablet  11  . mupirocin ointment (BACTROBAN) 2 % Use qd with dressing change  30 g  0  . omeprazole (PRILOSEC) 20 MG capsule Take 2 capsules (40 mg total) by mouth daily.  30 capsule  11  . potassium chloride SA (K-DUR,KLOR-CON) 20 MEQ tablet Take 1 tablet (20 mEq total) by mouth daily.  30 tablet  6  . torsemide (DEMADEX) 20 MG tablet Take 20 mg by mouth daily. Takes 2 tablets each am, and 2 tabs in afternoon every other day       No current facility-administered medications for this visit.     Allergies:  Allergies  Allergen Reactions  . Ace Inhibitors Other (See Comments)    me  hyposion  . Beta Adrenergic Blockers Other (See Comments)     use cautiously secondary to 2nd degree heart block, hypotension    Past Medical History, Surgical history, Social history, and Family History were reviewed and updated.  Review of Systems: Constitutional:  Negative for fever, chills, night sweats, anorexia, weight loss, pain. Cardiovascular: no chest pain or dyspnea on exertion Respiratory: no cough, shortness of breath, or wheezing Neurological: no TIA or stroke symptoms Dermatological: negative for pruritus and rash ENT: negative for - epistaxis, tinnitus or vertigo Skin: Negative. Gastrointestinal: no abdominal pain, change in bowel habits, or black or bloody stools Genito-Urinary: negative for -  hematuria Hematological and Lymphatic: negative for - bleeding problems or weight loss Breast: negative Musculoskeletal: negative for - joint pain or joint stiffness Remaining ROS negative. Physical Exam: Blood pressure 155/66, pulse 76, temperature 97.8 F (36.6 C), temperature source Oral, resp. rate 18, height 5\' 10"  (1.778 m), weight 238 lb 14.4 oz (108.364 kg). ECOG:  General appearance: alert and cooperative Head: Normocephalic, without obvious abnormality, atraumatic Neck: no adenopathy, no carotid bruit, no JVD, supple, symmetrical, trachea midline and thyroid not enlarged, symmetric, no tenderness/mass/nodules Lymph nodes: Cervical, supraclavicular, and axillary nodes normal. Heart:regular rate and rhythm, S1, S2 normal, no murmur, click, rub or gallop Lung:chest clear, no wheezing, rales, normal symmetric air entry Abdomin: soft, non-tender, without masses or organomegaly EXT: Edema noted bilaterally.   Lab Results: Lab Results  Component Value Date   WBC 10.3 06/10/2013   HGB 9.8* 06/10/2013   HCT 31.3* 06/10/2013   MCV 85.4 06/10/2013   PLT 333 06/10/2013     Chemistry      Component Value Date/Time   NA 142 05/06/2013 1044   NA 136* 04/26/2013 0545   K 4.0 05/06/2013 1044   K 3.8 04/26/2013 0545   CL 99 04/26/2013 0545   CO2 23 05/06/2013 1044   CO2 23 04/26/2013 0545   BUN 50.0* 05/06/2013 1044   BUN 58* 04/26/2013 0545   CREATININE 2.4* 05/06/2013 1044   CREATININE 2.93* 04/26/2013 0545   CREATININE 2.67* 04/22/2013 1801      Component Value Date/Time   CALCIUM 8.5 05/06/2013 1044   CALCIUM 8.5 04/26/2013 0545   ALKPHOS 96 05/06/2013 1044   ALKPHOS 55 04/21/2013 0040   AST 20 05/06/2013 1044   AST 16 04/21/2013 0040   ALT 27 05/06/2013 1044   ALT 13 04/21/2013 0040   BILITOT 0.23 05/06/2013 1044   BILITOT 0.3 04/21/2013 0040       Impression and Plan:  80 year old gentleman with the following issues:   1. Multifactorial anemia: He has an element of anemia of renal disease as  well as iron deficiency anemia. The plan is continue Aranesp 300 mcg every 3 weeks I will also supplement him with IV iron. Risks and benefits of this drug was discussed today complications include infusion-related toxicities were outlined and he is agreeable to proceed we'll set that up for him in the next few weeks.  2. History of prostate cancer: He is status post prostatectomy close to 15 years ago.Now he has questionable sclerotic lesions with a PSA of around 1.89.  The differential diagnosis of these lesions were discussed with the patient and his family. Certainly he could have a poorly differentiated cancer that is not producing PSA and certainly the biopsy could have missed. Also least lesions could be benign in  etiology. I plan on the repeat his imaging studies in the near future for interval assessment.    Wyatt Portela, MD 4/22/201510:24 AM

## 2013-06-10 NOTE — Telephone Encounter (Signed)
Talked to pt's daughter and they have all  appt for lab,md, chemo until June 2015

## 2013-06-10 NOTE — Addendum Note (Signed)
Addended by: Randolm Idol on: 06/10/2013 10:52 AM   Modules accepted: Orders

## 2013-06-10 NOTE — Telephone Encounter (Signed)
Gave pt appt for labs, and MD, emailed michelle regarding Bella Kennedy

## 2013-06-12 ENCOUNTER — Other Ambulatory Visit: Payer: Self-pay

## 2013-06-12 ENCOUNTER — Other Ambulatory Visit: Payer: Self-pay | Admitting: Endocrinology

## 2013-06-12 ENCOUNTER — Telehealth: Payer: Self-pay

## 2013-06-12 MED ORDER — INSULIN DETEMIR 100 UNIT/ML FLEXPEN: PEN_INJECTOR | SUBCUTANEOUS | Status: DC

## 2013-06-12 NOTE — Telephone Encounter (Signed)
Please refill x 1 Ov is due  

## 2013-06-12 NOTE — Telephone Encounter (Signed)
Relevant patient education mailed to patient.  

## 2013-06-12 NOTE — Telephone Encounter (Signed)
Please advise if ok to refill. Thanks 

## 2013-06-20 ENCOUNTER — Other Ambulatory Visit: Payer: Self-pay | Admitting: Endocrinology

## 2013-06-22 NOTE — Telephone Encounter (Signed)
Rx sent 

## 2013-06-22 NOTE — Telephone Encounter (Signed)
Please refill x 1 Ov is due  

## 2013-06-22 NOTE — Telephone Encounter (Signed)
Please advise if ok to refill. Medication is not on current medication list.  Thanks!

## 2013-06-23 ENCOUNTER — Telehealth: Payer: Self-pay | Admitting: *Deleted

## 2013-06-23 NOTE — Telephone Encounter (Signed)
Patient's daughter called and moved appts from 5/13 to 5/7. Desk RN notified

## 2013-06-25 ENCOUNTER — Other Ambulatory Visit: Payer: Self-pay | Admitting: Medical Oncology

## 2013-06-25 ENCOUNTER — Ambulatory Visit (HOSPITAL_BASED_OUTPATIENT_CLINIC_OR_DEPARTMENT_OTHER): Payer: Medicare PPO

## 2013-06-25 ENCOUNTER — Ambulatory Visit: Payer: Medicare PPO

## 2013-06-25 ENCOUNTER — Ambulatory Visit (HOSPITAL_COMMUNITY)
Admission: RE | Admit: 2013-06-25 | Discharge: 2013-06-25 | Disposition: A | Discharge status: Home / Self Care | Payer: Medicare PPO | Source: Ambulatory Visit | Attending: Oncology | Admitting: Oncology

## 2013-06-25 VITALS — BP 146/58 | HR 77 | Temp 98.2°F

## 2013-06-25 DIAGNOSIS — D631 Anemia in chronic kidney disease: Secondary | ICD-10-CM

## 2013-06-25 DIAGNOSIS — N183 Chronic kidney disease, stage 3 unspecified: Secondary | ICD-10-CM

## 2013-06-25 DIAGNOSIS — N039 Chronic nephritic syndrome with unspecified morphologic changes: Principal | ICD-10-CM

## 2013-06-25 DIAGNOSIS — N189 Chronic kidney disease, unspecified: Secondary | ICD-10-CM | POA: Insufficient documentation

## 2013-06-25 LAB — HOLD TUBE, BLOOD BANK

## 2013-06-25 LAB — CBC WITH DIFFERENTIAL/PLATELET
BASO%: 0 % (ref 0.0–2.0)
Basophils Absolute: 0 10*3/uL (ref 0.0–0.1)
EOS ABS: 0.2 10*3/uL (ref 0.0–0.5)
EOS%: 1.7 % (ref 0.0–7.0)
HCT: 19.2 % — ABNORMAL LOW (ref 38.4–49.9)
HGB: 6 g/dL — CL (ref 13.0–17.1)
LYMPH%: 5.6 % — ABNORMAL LOW (ref 14.0–49.0)
MCH: 27.7 pg (ref 27.2–33.4)
MCHC: 31.2 g/dL — AB (ref 32.0–36.0)
MCV: 88.6 fL (ref 79.3–98.0)
MONO#: 0.7 10*3/uL (ref 0.1–0.9)
MONO%: 7.4 % (ref 0.0–14.0)
NEUT%: 85.3 % — ABNORMAL HIGH (ref 39.0–75.0)
NEUTROS ABS: 8.3 10*3/uL — AB (ref 1.5–6.5)
Platelets: 309 10*3/uL (ref 140–400)
RBC: 2.17 10*6/uL — ABNORMAL LOW (ref 4.20–5.82)
RDW: 21 % — AB (ref 11.0–14.6)
WBC: 9.8 10*3/uL (ref 4.0–10.3)
lymph#: 0.6 10*3/uL — ABNORMAL LOW (ref 0.9–3.3)

## 2013-06-25 LAB — PREPARE RBC (CROSSMATCH)

## 2013-06-25 MED ORDER — DARBEPOETIN ALFA-POLYSORBATE 300 MCG/0.6ML IJ SOLN
300.0000 ug | Freq: Once | INTRAMUSCULAR | Status: AC
  Administered 2013-06-25: 300 ug via SUBCUTANEOUS
  Filled 2013-06-25: qty 0.6

## 2013-06-25 NOTE — Progress Notes (Signed)
Patient's Hgb 6.0, per MD, patient to receive 2 units of PRBC's, orders placed, HAR placed, pt informed of appt @ 0930 Friday 05/08. Patient and family expressed understanding, know to return for appt in am for transfusion.   POF sent.

## 2013-06-26 ENCOUNTER — Other Ambulatory Visit: Payer: Self-pay | Admitting: Medical Oncology

## 2013-06-26 ENCOUNTER — Ambulatory Visit (HOSPITAL_BASED_OUTPATIENT_CLINIC_OR_DEPARTMENT_OTHER): Payer: Medicare PPO

## 2013-06-26 ENCOUNTER — Ambulatory Visit: Payer: Medicare PPO

## 2013-06-26 VITALS — BP 123/64 | HR 69 | Temp 97.5°F | Resp 16

## 2013-06-26 DIAGNOSIS — D631 Anemia in chronic kidney disease: Secondary | ICD-10-CM

## 2013-06-26 DIAGNOSIS — C61 Malignant neoplasm of prostate: Secondary | ICD-10-CM

## 2013-06-26 DIAGNOSIS — N039 Chronic nephritic syndrome with unspecified morphologic changes: Principal | ICD-10-CM

## 2013-06-26 DIAGNOSIS — N189 Chronic kidney disease, unspecified: Secondary | ICD-10-CM

## 2013-06-26 MED ORDER — DIPHENHYDRAMINE HCL 25 MG PO CAPS
25.0000 mg | ORAL_CAPSULE | Freq: Once | ORAL | Status: AC
  Administered 2013-06-26: 25 mg via ORAL

## 2013-06-26 MED ORDER — ACETAMINOPHEN 325 MG PO TABS
ORAL_TABLET | ORAL | Status: AC
  Filled 2013-06-26: qty 2

## 2013-06-26 MED ORDER — DIPHENHYDRAMINE HCL 25 MG PO CAPS
ORAL_CAPSULE | ORAL | Status: AC
  Filled 2013-06-26: qty 1

## 2013-06-26 MED ORDER — ACETAMINOPHEN 325 MG PO TABS
650.0000 mg | ORAL_TABLET | Freq: Once | ORAL | Status: AC
  Administered 2013-06-26: 650 mg via ORAL

## 2013-06-26 MED ORDER — SODIUM CHLORIDE 0.9 % IV SOLN
250.0000 mL | Freq: Once | INTRAVENOUS | Status: AC
  Administered 2013-06-26: 250 mL via INTRAVENOUS

## 2013-06-26 NOTE — Patient Instructions (Signed)
Blood Transfusion  A blood transfusion replaces your blood or some of its parts. Blood is replaced when you have lost blood because of surgery, an accident, or for severe blood conditions like anemia. You can donate blood to be used on yourself if you have a planned surgery. If you lose blood during that surgery, your own blood can be given back to you. Any blood given to you is checked to make sure it matches your blood type. Your temperature, blood pressure, and heart rate (vital signs) will be checked often.  GET HELP RIGHT AWAY IF:   You feel sick to your stomach (nauseous) or throw up (vomit).  You have watery poop (diarrhea).  You have shortness of breath or trouble breathing.  You have blood in your pee (urine) or have dark colored pee.  You have chest pain or tightness.  Your eyes or skin turn yellow (jaundice).  You have a temperature by mouth above 102 F (38.9 C), not controlled by medicine.  You start to shake and have chills.  You develop a a red rash (hives) or feel itchy.  You develop lightheadedness or feel confused.  You develop back, joint, or muscle pain.  You do not feel hungry (lost appetite).  You feel tired, restless, or nervous.  You develop belly (abdominal) cramps. Document Released: 05/04/2008 Document Revised: 04/30/2011 Document Reviewed: 05/04/2008 [employer] Patient Information 2014 [employer].  

## 2013-06-27 LAB — TYPE AND SCREEN
ABO/RH(D): O POS
Antibody Screen: NEGATIVE
Unit division: 0
Unit division: 0

## 2013-07-01 ENCOUNTER — Ambulatory Visit: Payer: Medicare PPO

## 2013-07-01 ENCOUNTER — Other Ambulatory Visit: Payer: Medicare PPO

## 2013-07-02 ENCOUNTER — Other Ambulatory Visit (HOSPITAL_BASED_OUTPATIENT_CLINIC_OR_DEPARTMENT_OTHER): Payer: Medicare PPO

## 2013-07-02 ENCOUNTER — Other Ambulatory Visit: Payer: Self-pay | Admitting: *Deleted

## 2013-07-02 ENCOUNTER — Ambulatory Visit (HOSPITAL_BASED_OUTPATIENT_CLINIC_OR_DEPARTMENT_OTHER): Payer: Medicare PPO

## 2013-07-02 VITALS — BP 130/56 | HR 62 | Temp 97.8°F | Resp 18

## 2013-07-02 DIAGNOSIS — D631 Anemia in chronic kidney disease: Secondary | ICD-10-CM

## 2013-07-02 DIAGNOSIS — N039 Chronic nephritic syndrome with unspecified morphologic changes: Principal | ICD-10-CM

## 2013-07-02 DIAGNOSIS — N183 Chronic kidney disease, stage 3 unspecified: Secondary | ICD-10-CM

## 2013-07-02 DIAGNOSIS — D509 Iron deficiency anemia, unspecified: Secondary | ICD-10-CM

## 2013-07-02 LAB — CBC WITH DIFFERENTIAL/PLATELET
BASO%: 0.1 % (ref 0.0–2.0)
BASOS ABS: 0 10*3/uL (ref 0.0–0.1)
EOS%: 3.9 % (ref 0.0–7.0)
Eosinophils Absolute: 0.3 10*3/uL (ref 0.0–0.5)
HCT: 25.6 % — ABNORMAL LOW (ref 38.4–49.9)
HEMOGLOBIN: 7.7 g/dL — AB (ref 13.0–17.1)
LYMPH#: 0.7 10*3/uL — AB (ref 0.9–3.3)
LYMPH%: 8.4 % — ABNORMAL LOW (ref 14.0–49.0)
MCH: 27.2 pg (ref 27.2–33.4)
MCHC: 30.1 g/dL — AB (ref 32.0–36.0)
MCV: 90.2 fL (ref 79.3–98.0)
MONO#: 0.9 10*3/uL (ref 0.1–0.9)
MONO%: 10 % (ref 0.0–14.0)
NEUT#: 6.7 10*3/uL — ABNORMAL HIGH (ref 1.5–6.5)
NEUT%: 77.6 % — ABNORMAL HIGH (ref 39.0–75.0)
Platelets: 327 10*3/uL (ref 140–400)
RBC: 2.84 10*6/uL — AB (ref 4.20–5.82)
RDW: 20.3 % — AB (ref 11.0–14.6)
WBC: 8.6 10*3/uL (ref 4.0–10.3)

## 2013-07-02 MED ORDER — FERUMOXYTOL INJECTION 510 MG/17 ML
1020.0000 mg | Freq: Once | INTRAVENOUS | Status: AC
  Administered 2013-07-02: 1020 mg via INTRAVENOUS
  Filled 2013-07-02: qty 34

## 2013-07-02 MED ORDER — SODIUM CHLORIDE 0.9 % IV SOLN
Freq: Once | INTRAVENOUS | Status: AC
  Administered 2013-07-02: 15:00:00 via INTRAVENOUS

## 2013-07-02 NOTE — Patient Instructions (Signed)

## 2013-07-08 ENCOUNTER — Telehealth: Payer: Self-pay | Admitting: Internal Medicine

## 2013-07-08 NOTE — Telephone Encounter (Signed)
Patient's daughter is calling back about order. Wants to know if they can have order by today.

## 2013-07-08 NOTE — Telephone Encounter (Signed)
Patient's daughter called and asks that an oxygen tank order be sent to [facility]. Patient is having issues with his breathing. Please advise.

## 2013-07-08 NOTE — Telephone Encounter (Signed)
unfortunately due to insurance rules, pt needs to be seen at [facility], or ER if the oxygen is new for him

## 2013-07-08 NOTE — Telephone Encounter (Signed)
Called informed the daughter and she did agree to schedule appointment with Dr. Jenny Reichmann

## 2013-07-09 ENCOUNTER — Other Ambulatory Visit (INDEPENDENT_AMBULATORY_CARE_PROVIDER_SITE_OTHER): Payer: Medicare PPO

## 2013-07-09 ENCOUNTER — Encounter: Payer: Self-pay | Admitting: Internal Medicine

## 2013-07-09 ENCOUNTER — Ambulatory Visit (INDEPENDENT_AMBULATORY_CARE_PROVIDER_SITE_OTHER): Payer: Medicare PPO | Admitting: Internal Medicine

## 2013-07-09 VITALS — BP 142/68 | HR 67 | Temp 98.1°F | Wt 228.0 lb

## 2013-07-09 DIAGNOSIS — E1165 Type 2 diabetes mellitus with hyperglycemia: Principal | ICD-10-CM

## 2013-07-09 DIAGNOSIS — J9610 Chronic respiratory failure, unspecified whether with hypoxia or hypercapnia: Secondary | ICD-10-CM

## 2013-07-09 DIAGNOSIS — I10 Essential (primary) hypertension: Secondary | ICD-10-CM

## 2013-07-09 DIAGNOSIS — IMO0001 Reserved for inherently not codable concepts without codable children: Secondary | ICD-10-CM

## 2013-07-09 DIAGNOSIS — I5032 Chronic diastolic (congestive) heart failure: Secondary | ICD-10-CM

## 2013-07-09 LAB — LIPID PANEL
CHOLESTEROL: 110 mg/dL (ref 0–200)
HDL: 34.7 mg/dL — ABNORMAL LOW (ref >39.00)
LDL Cholesterol: 51 mg/dL (ref 0–99)
Total CHOL/HDL Ratio: 3
Triglycerides: 124 mg/dL (ref 0.0–149.0)
VLDL: 24.8 mg/dL (ref 0.0–40.0)

## 2013-07-09 LAB — BASIC METABOLIC PANEL
BUN: 64 mg/dL — AB (ref 6–23)
CHLORIDE: 107 meq/L (ref 96–112)
CO2: 26 meq/L (ref 19–32)
CREATININE: 2.6 mg/dL — AB (ref 0.4–1.5)
Calcium: 9.3 mg/dL (ref 8.4–10.5)
GFR: 30.37 mL/min — ABNORMAL LOW (ref >60.00)
Glucose, Bld: 76 mg/dL (ref 70–99)
Potassium: 4.2 mEq/L (ref 3.5–5.1)
Sodium: 141 mEq/L (ref 135–145)

## 2013-07-09 LAB — HEPATIC FUNCTION PANEL
ALT: 14 U/L (ref 0–53)
AST: 17 U/L (ref 0–37)
Albumin: 3 g/dL — ABNORMAL LOW (ref 3.5–5.2)
Alkaline Phosphatase: 65 U/L (ref 39–117)
Bilirubin, Direct: 0 mg/dL (ref 0.0–0.3)
TOTAL PROTEIN: 6.8 g/dL (ref 6.0–8.3)
Total Bilirubin: 0.2 mg/dL (ref 0.2–1.2)

## 2013-07-09 LAB — HEMOGLOBIN A1C: Hgb A1c MFr Bld: 5 % (ref 4.6–6.5)

## 2013-07-09 NOTE — Assessment & Plan Note (Signed)
Also for a1c as he is due

## 2013-07-09 NOTE — Assessment & Plan Note (Signed)
No evidence for acute illness, except for mild diast dysfunction and edema, pulm exam benign, has exertional hypoxemia - for re-start 3L home o2 continuous per [employer]

## 2013-07-09 NOTE — Assessment & Plan Note (Signed)
With mild acute worsening, ok for torsemide 20 mg - 2 po bid for 1 wk, then resume prior dosing

## 2013-07-09 NOTE — Progress Notes (Signed)
Subjective:    Patient ID: [name], male    DOB: [birth year], 80 y.o.   MRN: [id number]  HPI  Here to f/u, pt unfortunately stopped his O2 about early April for unclear reasons, but now would like re-start.  Pt denies chest pain, increased sob or doe over basline, no wheezing, orthopnea, PND, palpitations, dizziness or syncope, but has had some increased LE edema and several lb wt gain as well recently. No cough, fever.  O2 sat at rest today 93%, but with exertion to 50 ft only was 84% on RA.  Also being tx for anemia per hematology, last hgb 7.7  - 1 wk ago. No other new complaints. Edema does tend to go down at night, but lately not.  Last echo 2013 with normal EF, but gr 2 diast dysfunction Past Medical History  Diagnosis Date  . Chronic diastolic heart failure     secondary to diastolic dysfunction EF (previous EF 35-45%)ef 60%4/09  . Arrhythmia     atrial fibrillation /pt on amiodarone,thought to be poor coumadin  . Stroke   . Other and unspecified hyperlipidemia   . Hypertension   . Hypertrophy of prostate without urinary obstruction and other lower urinary tract symptoms (LUTS)   . Osteoarthrosis, unspecified whether generalized or localized, unspecified site   . Type II or unspecified type diabetes mellitus without mention of complication, not stated as uncontrolled   . AV block, Mobitz 1     Intolerance to ACE's / discontiuation of beta blockers  . Obstructive sleep apnea   . Iron deficiency anemia, unspecified     s/p EGD and coloscopy 3/10.gastritis.hemorrhids  . Cerebrovascular accident   . Renal insufficiency   . Obesity   . Allergic rhinitis, cause unspecified 05/29/2010  . CAD (coronary artery disease)     cath 12/04: mLAD 50-60%, mCFX 20-30%, pRCA 50-60%, mRCA 40%  . Normochromic normocytic anemia 04/21/2013  . Abnormal CXR 04/21/2013  . Hypertensive kidney disease with CKD stage III 04/21/2013  . Prostate cancer    Past Surgical History  Procedure Laterality Date    . Prostate surgery    . Prostate surgury      hx of  . Esophagogastroduodenoscopy N/A 04/21/2013    Procedure: ESOPHAGOGASTRODUODENOSCOPY (EGD);  Surgeon: Irene Shipper, MD;  Location: [facility];  Service: Endoscopy;  Laterality: N/A;    reports that he has quit smoking. He has never used smokeless tobacco. He reports that he does not drink alcohol or use illicit drugs. family history includes Heart disease in his father. Allergies  Allergen Reactions  . Ace Inhibitors Other (See Comments)    me  hyposion  . Beta Adrenergic Blockers Other (See Comments)     use cautiously secondary to 2nd degree heart block, hypotension   Current Outpatient Prescriptions on File Prior to Visit  Medication Sig Dispense Refill  . allopurinol (ZYLOPRIM) 100 MG tablet Take 1 tablet (100 mg total) by mouth at bedtime.  30 tablet  3  . amiodarone (PACERONE) 200 MG tablet Take 0.5 tablets (100 mg total) by mouth every morning.  15 tablet  6  . amLODipine (NORVASC) 10 MG tablet Take 1 tablet (10 mg total) by mouth daily.  30 tablet  6  . aspirin EC 81 MG EC tablet Take 1 tablet (81 mg total) by mouth daily.      Marland Kitchen atorvastatin (LIPITOR) 40 MG tablet TAKE 1 TABLET BY MOUTH EVERY DAY  30 tablet  6  . citalopram (  CELEXA) 10 MG tablet Take 10 mg by mouth 2 (two) times daily.      . cloNIDine (CATAPRES - DOSED IN MG/24 HR) 0.1 mg/24hr patch Place 1 patch (0.1 mg total) onto the skin once a week. Change on sundays  4 patch  6  . Fe Fum-Vit C-Vit B12-FA (TRIGELS-F) 460-60-0.01-1 MG CAPS capsule Take 1 capsule by mouth daily.  30 capsule  6  . Fluticasone-Salmeterol (ADVAIR DISKUS) 100-50 MCG/DOSE AEPB Inhale 1 puff into the lungs 2 (two) times daily.  1 each  0  . GLUMETZA 500 MG 24 hr tablet TAKE 1 TABLET BY MOUTH EVERY DAY WITH BREAKFAST  30 tablet  1  . hydrALAZINE (APRESOLINE) 100 MG tablet TAKE 1 TABLET BY MOUTH 3 TIMES A DAY  90 tablet  6  . Insulin Detemir (LEVEMIR FLEXTOUCH) 100 UNIT/ML Pen INJECT 1.25 (125  units) ML INTO THE SKIN EVERY MORNING  15 pen  1  . insulin detemir (LEVEMIR) 100 UNIT/ML injection Inject 125 Units into the skin every morning.      . isosorbide mononitrate (IMDUR) 60 MG 24 hr tablet Take 1 tablet (60 mg total) by mouth daily.  90 tablet  3  . levothyroxine (SYNTHROID, LEVOTHROID) 125 MCG tablet TAKE 1 TABLET BY MOUTH ONCE EVERY DAY  30 tablet  11  . mupirocin ointment (BACTROBAN) 2 % Use qd with dressing change  30 g  0  . omeprazole (PRILOSEC) 20 MG capsule Take 2 capsules (40 mg total) by mouth daily.  30 capsule  11  . potassium chloride SA (K-DUR,KLOR-CON) 20 MEQ tablet Take 1 tablet (20 mEq total) by mouth daily.  30 tablet  6  . torsemide (DEMADEX) 20 MG tablet Take 20 mg by mouth daily. Takes 2 tablets each am, and 2 tabs in afternoon every other day       No current facility-administered medications on file prior to visit.   Review of Systems  Constitutional: Negative for unusual diaphoresis or other sweats  HENT: Negative for ringing in ear Eyes: Negative for double vision or worsening visual disturbance.  Respiratory: Negative for choking and stridor.   Gastrointestinal: Negative for vomiting or other signifcant bowel change Genitourinary: Negative for hematuria or decreased urine volume.  Musculoskeletal: Negative for other MSK pain or swelling Skin: Negative for color change and worsening wound.  Neurological: Negative for tremors and numbness other than noted  Psychiatric/Behavioral: Negative for decreased concentration or agitation other than above       Objective:   Physical Exam BP 142/68  Pulse 67  Temp(Src) 98.1 F (36.7 C) (Oral)  Wt 228 lb (103.42 kg)  SpO2 93% VS noted, amb o2 sat 84% on RA Constitutional: Pt appears well-developed, well-nourished.  HENT: Head: NCAT.  Right Ear: External ear normal.  Left Ear: External ear normal.  Eyes: . Pupils are equal, round, and reactive to light. Conjunctivae and EOM are normal Neck: Normal range  of motion. Neck supple.  Cardiovascular: Normal rate and regular rhythm.   Pulmonary/Chest: Effort normal and breath sounds decreased, but no rales or wheezing.  Abd:  Soft, NT, ND, + BS Neurological: Pt is alert. Not confused , motor grossly intact Skin: Skin is warm. No rash, has 2+ edema to knees left > right Psychiatric: Pt behavior is normal. No agitation.      Assessment & Plan:

## 2013-07-09 NOTE — Assessment & Plan Note (Signed)
.  stable overall by history and exam, recent data reviewed with pt, and pt to continue medical treatment as before,  to f/u any worsening symptoms or concerns BP Readings from Last 3 Encounters:  07/09/13 142/68  07/02/13 130/56  06/26/13 123/64

## 2013-07-09 NOTE — Progress Notes (Signed)
Pre visit review using our clinic review tool, if applicable. No additional management support is needed unless otherwise documented below in the visit note. 

## 2013-07-09 NOTE — Patient Instructions (Signed)
Please continue all other medications as before, and refills have been done if requested. Please have the pharmacy call with any other refills you may need.  Please continue your efforts at being more active, low cholesterol diet, and weight control.  You will be contacted regarding the referral for: Home oxygen (to see [facility] now)  Please go to the LAB in the Basement (turn left off the elevator) for the tests to be done today - for the Diabetes only (not anemia)  You will be contacted by phone if any changes need to be made immediately.  Otherwise, you will receive a letter about your results with an explanation, but please check with MyChart first  Please remember to sign up for MyChart if you have not done so, as this will be important to you in the future with finding out test results, communicating by private email, and scheduling acute appointments online when needed.  Please return in 6 months, or sooner if needed  .

## 2013-07-10 ENCOUNTER — Other Ambulatory Visit: Payer: Self-pay

## 2013-07-15 ENCOUNTER — Telehealth: Payer: Self-pay

## 2013-07-15 ENCOUNTER — Other Ambulatory Visit (HOSPITAL_COMMUNITY): Payer: Self-pay | Admitting: *Deleted

## 2013-07-15 MED ORDER — ACCU-CHEK AVIVA PLUS W/DEVICE KIT: PACK | Status: DC

## 2013-07-15 MED ORDER — CLONIDINE HCL 0.1 MG/24HR TD PTWK: 0.1000 mg | MEDICATED_PATCH | TRANSDERMAL | Status: DC

## 2013-07-15 MED ORDER — ACCU-CHEK SOFT TOUCH LANCETS MISC: Status: DC

## 2013-07-15 MED ORDER — GLUCOSE BLOOD VI STRP: ORAL_STRIP | Status: DC

## 2013-07-15 NOTE — Telephone Encounter (Signed)
Refill strips, meter and lancets.

## 2013-07-16 ENCOUNTER — Other Ambulatory Visit: Payer: Self-pay

## 2013-07-16 ENCOUNTER — Other Ambulatory Visit (HOSPITAL_COMMUNITY): Payer: Self-pay

## 2013-07-16 MED ORDER — HYDRALAZINE HCL 100 MG PO TABS: ORAL_TABLET | ORAL | Status: DC

## 2013-07-21 ENCOUNTER — Other Ambulatory Visit: Payer: Self-pay | Admitting: Oncology

## 2013-07-21 DIAGNOSIS — D509 Iron deficiency anemia, unspecified: Secondary | ICD-10-CM

## 2013-07-22 ENCOUNTER — Ambulatory Visit (HOSPITAL_COMMUNITY)
Admission: RE | Admit: 2013-07-22 | Discharge: 2013-07-22 | Disposition: A | Discharge status: Home / Self Care | Payer: Medicare PPO | Source: Ambulatory Visit | Attending: Oncology | Admitting: Oncology

## 2013-07-22 ENCOUNTER — Other Ambulatory Visit: Payer: Self-pay | Admitting: Medical Oncology

## 2013-07-22 ENCOUNTER — Ambulatory Visit (HOSPITAL_BASED_OUTPATIENT_CLINIC_OR_DEPARTMENT_OTHER): Payer: Medicare PPO

## 2013-07-22 ENCOUNTER — Ambulatory Visit: Payer: Medicare PPO

## 2013-07-22 ENCOUNTER — Other Ambulatory Visit (HOSPITAL_BASED_OUTPATIENT_CLINIC_OR_DEPARTMENT_OTHER): Payer: Medicare PPO

## 2013-07-22 VITALS — BP 138/60 | HR 72 | Temp 96.8°F | Resp 17

## 2013-07-22 DIAGNOSIS — D509 Iron deficiency anemia, unspecified: Secondary | ICD-10-CM | POA: Insufficient documentation

## 2013-07-22 DIAGNOSIS — C61 Malignant neoplasm of prostate: Secondary | ICD-10-CM

## 2013-07-22 DIAGNOSIS — N039 Chronic nephritic syndrome with unspecified morphologic changes: Principal | ICD-10-CM

## 2013-07-22 DIAGNOSIS — D631 Anemia in chronic kidney disease: Secondary | ICD-10-CM

## 2013-07-22 DIAGNOSIS — N183 Chronic kidney disease, stage 3 unspecified: Secondary | ICD-10-CM

## 2013-07-22 LAB — CBC WITH DIFFERENTIAL/PLATELET
BASO%: 0 % (ref 0.0–2.0)
Basophils Absolute: 0 10*3/uL (ref 0.0–0.1)
EOS%: 4.1 % (ref 0.0–7.0)
Eosinophils Absolute: 0.3 10*3/uL (ref 0.0–0.5)
HCT: 23.6 % — ABNORMAL LOW (ref 38.4–49.9)
HEMOGLOBIN: 7 g/dL — AB (ref 13.0–17.1)
LYMPH#: 0.6 10*3/uL — AB (ref 0.9–3.3)
LYMPH%: 8.6 % — ABNORMAL LOW (ref 14.0–49.0)
MCH: 26.9 pg — ABNORMAL LOW (ref 27.2–33.4)
MCHC: 29.7 g/dL — ABNORMAL LOW (ref 32.0–36.0)
MCV: 90.8 fL (ref 79.3–98.0)
MONO#: 0.6 10*3/uL (ref 0.1–0.9)
MONO%: 8.6 % (ref 0.0–14.0)
NEUT#: 5.4 10*3/uL (ref 1.5–6.5)
NEUT%: 78.7 % — ABNORMAL HIGH (ref 39.0–75.0)
PLATELETS: 372 10*3/uL (ref 140–400)
RBC: 2.6 10*6/uL — ABNORMAL LOW (ref 4.20–5.82)
RDW: 18.8 % — ABNORMAL HIGH (ref 11.0–14.6)
WBC: 6.9 10*3/uL (ref 4.0–10.3)
nRBC: 0 % (ref 0–0)

## 2013-07-22 LAB — HOLD TUBE, BLOOD BANK

## 2013-07-22 LAB — PREPARE RBC (CROSSMATCH)

## 2013-07-22 MED ORDER — ACETAMINOPHEN 325 MG PO TABS
650.0000 mg | ORAL_TABLET | Freq: Once | ORAL | Status: AC
  Administered 2013-07-22: 650 mg via ORAL

## 2013-07-22 MED ORDER — ACETAMINOPHEN 325 MG PO TABS
ORAL_TABLET | ORAL | Status: AC
  Filled 2013-07-22: qty 2

## 2013-07-22 MED ORDER — DIPHENHYDRAMINE HCL 25 MG PO CAPS
ORAL_CAPSULE | ORAL | Status: AC
  Filled 2013-07-22: qty 1

## 2013-07-22 MED ORDER — SODIUM CHLORIDE 0.9 % IV SOLN
250.0000 mL | Freq: Once | INTRAVENOUS | Status: AC
  Administered 2013-07-22: 250 mL via INTRAVENOUS

## 2013-07-22 MED ORDER — DARBEPOETIN ALFA-POLYSORBATE 300 MCG/0.6ML IJ SOLN
300.0000 ug | Freq: Once | INTRAMUSCULAR | Status: AC
  Administered 2013-07-22: 300 ug via SUBCUTANEOUS
  Filled 2013-07-22: qty 0.6

## 2013-07-22 MED ORDER — DIPHENHYDRAMINE HCL 25 MG PO CAPS
25.0000 mg | ORAL_CAPSULE | Freq: Once | ORAL | Status: AC
  Administered 2013-07-22: 25 mg via ORAL

## 2013-07-22 NOTE — Patient Instructions (Signed)
Blood Transfusion  A blood transfusion replaces your blood or some of its parts. Blood is replaced when you have lost blood because of surgery, an accident, or for severe blood conditions like anemia. You can donate blood to be used on yourself if you have a planned surgery. If you lose blood during that surgery, your own blood can be given back to you. Any blood given to you is checked to make sure it matches your blood type. Your temperature, blood pressure, and heart rate (vital signs) will be checked often.  GET HELP RIGHT AWAY IF:   You feel sick to your stomach (nauseous) or throw up (vomit).  You have watery poop (diarrhea).  You have shortness of breath or trouble breathing.  You have blood in your pee (urine) or have dark colored pee.  You have chest pain or tightness.  Your eyes or skin turn yellow (jaundice).  You have a temperature by mouth above 102 F (38.9 C), not controlled by medicine.  You start to shake and have chills.  You develop a a red rash (hives) or feel itchy.  You develop lightheadedness or feel confused.  You develop back, joint, or muscle pain.  You do not feel hungry (lost appetite).  You feel tired, restless, or nervous.  You develop belly (abdominal) cramps. Document Released: 05/04/2008 Document Revised: 04/30/2011 Document Reviewed: 05/04/2008 [employer] Patient Information 2014 [employer].  

## 2013-07-22 NOTE — Progress Notes (Signed)
Per MD, patient to receive 1 unit of PRBCs today. Patient informed. Appt with tx room for transfusion @ noon. Patient verbalized understanding.

## 2013-07-23 LAB — TYPE AND SCREEN
ABO/RH(D): O POS
Antibody Screen: NEGATIVE
UNIT DIVISION: 0

## 2013-07-27 ENCOUNTER — Other Ambulatory Visit (HOSPITAL_COMMUNITY): Payer: Self-pay | Admitting: *Deleted

## 2013-07-27 MED ORDER — ISOSORBIDE MONONITRATE ER 60 MG PO TB24: 60.0000 mg | ORAL_TABLET | Freq: Every day | ORAL | Status: DC

## 2013-08-06 ENCOUNTER — Other Ambulatory Visit: Payer: Self-pay | Admitting: Internal Medicine

## 2013-08-07 ENCOUNTER — Other Ambulatory Visit: Payer: Self-pay

## 2013-08-07 MED ORDER — OMEPRAZOLE 20 MG PO CPDR: 40.0000 mg | DELAYED_RELEASE_CAPSULE | Freq: Every day | ORAL | Status: DC

## 2013-08-09 ENCOUNTER — Other Ambulatory Visit: Payer: Self-pay | Admitting: Internal Medicine

## 2013-08-12 ENCOUNTER — Other Ambulatory Visit (HOSPITAL_BASED_OUTPATIENT_CLINIC_OR_DEPARTMENT_OTHER): Payer: Medicare PPO

## 2013-08-12 ENCOUNTER — Ambulatory Visit (HOSPITAL_BASED_OUTPATIENT_CLINIC_OR_DEPARTMENT_OTHER): Payer: Medicare PPO | Admitting: Oncology

## 2013-08-12 ENCOUNTER — Telehealth: Payer: Self-pay | Admitting: *Deleted

## 2013-08-12 ENCOUNTER — Ambulatory Visit: Payer: Medicare PPO

## 2013-08-12 ENCOUNTER — Ambulatory Visit (HOSPITAL_BASED_OUTPATIENT_CLINIC_OR_DEPARTMENT_OTHER): Payer: Medicare PPO

## 2013-08-12 ENCOUNTER — Encounter: Payer: Self-pay | Admitting: Oncology

## 2013-08-12 ENCOUNTER — Telehealth: Payer: Self-pay | Admitting: Oncology

## 2013-08-12 VITALS — BP 125/56 | HR 76 | Temp 97.9°F | Resp 18

## 2013-08-12 VITALS — BP 114/47 | HR 82 | Temp 96.9°F | Resp 18 | Ht 70.0 in | Wt 243.2 lb

## 2013-08-12 DIAGNOSIS — D509 Iron deficiency anemia, unspecified: Secondary | ICD-10-CM

## 2013-08-12 DIAGNOSIS — D638 Anemia in other chronic diseases classified elsewhere: Secondary | ICD-10-CM

## 2013-08-12 DIAGNOSIS — N183 Chronic kidney disease, stage 3 unspecified: Secondary | ICD-10-CM

## 2013-08-12 DIAGNOSIS — M899 Disorder of bone, unspecified: Secondary | ICD-10-CM

## 2013-08-12 DIAGNOSIS — Z8546 Personal history of malignant neoplasm of prostate: Secondary | ICD-10-CM

## 2013-08-12 DIAGNOSIS — N039 Chronic nephritic syndrome with unspecified morphologic changes: Principal | ICD-10-CM

## 2013-08-12 DIAGNOSIS — D631 Anemia in chronic kidney disease: Secondary | ICD-10-CM

## 2013-08-12 DIAGNOSIS — M949 Disorder of cartilage, unspecified: Secondary | ICD-10-CM

## 2013-08-12 LAB — CBC WITH DIFFERENTIAL/PLATELET
BASO%: 0.8 % (ref 0.0–2.0)
Basophils Absolute: 0.1 10*3/uL (ref 0.0–0.1)
EOS%: 5 % (ref 0.0–7.0)
Eosinophils Absolute: 0.4 10*3/uL (ref 0.0–0.5)
HCT: 20.6 % — ABNORMAL LOW (ref 38.4–49.9)
HGB: 6.2 g/dL — CL (ref 13.0–17.1)
LYMPH%: 7.3 % — AB (ref 14.0–49.0)
MCH: 28.1 pg (ref 27.2–33.4)
MCHC: 30 g/dL — AB (ref 32.0–36.0)
MCV: 93.4 fL (ref 79.3–98.0)
MONO#: 0.6 10*3/uL (ref 0.1–0.9)
MONO%: 8.4 % (ref 0.0–14.0)
NEUT#: 5.9 10*3/uL (ref 1.5–6.5)
NEUT%: 78.5 % — ABNORMAL HIGH (ref 39.0–75.0)
PLATELETS: 298 10*3/uL (ref 140–400)
RBC: 2.2 10*6/uL — AB (ref 4.20–5.82)
RDW: 19.2 % — ABNORMAL HIGH (ref 11.0–14.6)
WBC: 7.5 10*3/uL (ref 4.0–10.3)
lymph#: 0.5 10*3/uL — ABNORMAL LOW (ref 0.9–3.3)

## 2013-08-12 LAB — PREPARE RBC (CROSSMATCH)

## 2013-08-12 MED ORDER — DIPHENHYDRAMINE HCL 25 MG PO CAPS
ORAL_CAPSULE | ORAL | Status: AC
  Filled 2013-08-12: qty 1

## 2013-08-12 MED ORDER — ACETAMINOPHEN 325 MG PO TABS
ORAL_TABLET | ORAL | Status: AC
  Filled 2013-08-12: qty 2

## 2013-08-12 MED ORDER — DARBEPOETIN ALFA-POLYSORBATE 300 MCG/0.6ML IJ SOLN
300.0000 ug | Freq: Once | INTRAMUSCULAR | Status: AC
  Administered 2013-08-12: 300 ug via SUBCUTANEOUS
  Filled 2013-08-12: qty 0.6

## 2013-08-12 MED ORDER — SODIUM CHLORIDE 0.9 % IV SOLN
250.0000 mL | Freq: Once | INTRAVENOUS | Status: AC
  Administered 2013-08-12: 250 mL via INTRAVENOUS

## 2013-08-12 MED ORDER — ACETAMINOPHEN 325 MG PO TABS
650.0000 mg | ORAL_TABLET | Freq: Once | ORAL | Status: AC
  Administered 2013-08-12: 650 mg via ORAL

## 2013-08-12 MED ORDER — DIPHENHYDRAMINE HCL 25 MG PO CAPS
25.0000 mg | ORAL_CAPSULE | Freq: Once | ORAL | Status: AC
  Administered 2013-08-12: 25 mg via ORAL

## 2013-08-12 NOTE — Patient Instructions (Signed)

## 2013-08-12 NOTE — Telephone Encounter (Signed)
gv and printed appt sched and avs for pt for July....emailed MW to add tx

## 2013-08-12 NOTE — Telephone Encounter (Signed)
Per staff message and POF I have scheduled appts. Advised scheduler of appts. JMW  

## 2013-08-12 NOTE — Progress Notes (Signed)
Hematology and Oncology Follow Up Visit  [name] [id number] 11/25/1932 80 y.o. 08/12/2013 10:19 AM [name], MDJohn, Hunt Oris, MD   Principle Diagnosis: 80 year old gentleman with multifactorial anemia he has an element of anemia of chronic disease as well as iron deficiency anemia. This was diagnosed in March of 2015. He also has questionable sclerotic bone lesions that were biopsied without any specific diagnosis made.  Current therapy: Status post 300 mcg every 3 weeks to keep his hemoglobin above 11. He also getting sporadic blood transfusion as needed.  Interim History:  [name] presents today for a followup visit. He is a gentleman I saw in consultation in March 2015 for the evaluation of anemia. Since his last visit, he continued to be stable clinically but requiring recurrent transfusions. He is able to ambulate with the help of a walker. He has not had any falls or illnesses. He has not reported any complications from Aranesp. Is ever any chest pain shortness of breath or difficulty breathing. He is not reporting any back pain shoulder pain or any bone pain at this time. He had not reported any hospitalizations or illnesses. Has not reported any headaches or blurry vision or double vision. Has not reported any nausea or vomiting or abdominal pain. Has not reported any frequency urgency or hesitancy. Rest or view of system is unremarkable.  Medications: I have reviewed the patient's current medications.  Current Outpatient Prescriptions  Medication Sig Dispense Refill  . allopurinol (ZYLOPRIM) 100 MG tablet Take 1 tablet (100 mg total) by mouth at bedtime.  30 tablet  3  . amiodarone (PACERONE) 200 MG tablet Take 0.5 tablets (100 mg total) by mouth every morning.  15 tablet  6  . amLODipine (NORVASC) 10 MG tablet Take 1 tablet (10 mg total) by mouth daily.  30 tablet  6  . aspirin EC 81 MG EC tablet Take 1 tablet (81 mg total) by mouth daily.      Marland Kitchen atorvastatin (LIPITOR) 40 MG  tablet TAKE 1 TABLET BY MOUTH EVERY DAY  30 tablet  6  . Blood Glucose Monitoring Suppl (ACCU-CHEK AVIVA PLUS) W/DEVICE KIT Use as directed to check blood sugar.  Diagnosis code 250.02  1 kit  0  . citalopram (CELEXA) 10 MG tablet Take 10 mg by mouth 2 (two) times daily.      . cloNIDine (CATAPRES - DOSED IN MG/24 HR) 0.1 mg/24hr patch Place 1 patch (0.1 mg total) onto the skin once a week. Change on sundays, MUST HAVE FOLLOW UP APPOINTMENT FOR FURTHER REFILLS  4 patch  2  . Fe Fum-Vit C-Vit B12-FA (TRIGELS-F) 460-60-0.01-1 MG CAPS capsule Take 1 capsule by mouth daily.  30 capsule  6  . Fluticasone-Salmeterol (ADVAIR DISKUS) 100-50 MCG/DOSE AEPB Inhale 1 puff into the lungs 2 (two) times daily.  1 each  0  . glucose blood (ACCU-CHEK AVIVA PLUS) test strip Use as directed twice daily to check blood sugar.  Diagnosis code 250.02  100 each  11  . hydrALAZINE (APRESOLINE) 100 MG tablet TAKE 1 TABLET BY MOUTH 3 TIMES A DAY  90 tablet  6  . Insulin Detemir (LEVEMIR FLEXTOUCH) 100 UNIT/ML Pen INJECT 1.25 (125 units) ML INTO THE SKIN EVERY MORNING  15 pen  1  . insulin detemir (LEVEMIR) 100 UNIT/ML injection Inject 125 Units into the skin every morning.      . isosorbide mononitrate (IMDUR) 60 MG 24 hr tablet Take 1 tablet (60 mg total) by mouth daily.  90 tablet  3  . Lancets (ACCU-CHEK SOFT TOUCH) lancets Use as instructed twice daily to check blood sugar.  Diagnosis code 250.02  100 each  11  . levothyroxine (SYNTHROID, LEVOTHROID) 125 MCG tablet TAKE 1 TABLET BY MOUTH ONCE EVERY DAY  30 tablet  11  . mupirocin ointment (BACTROBAN) 2 % Use qd with dressing change  30 g  0  . omeprazole (PRILOSEC) 20 MG capsule Take 2 capsules (40 mg total) by mouth daily.  30 capsule  6  . potassium chloride SA (K-DUR,KLOR-CON) 20 MEQ tablet Take 1 tablet (20 mEq total) by mouth daily.  30 tablet  6  . torsemide (DEMADEX) 20 MG tablet Take 20 mg by mouth daily. Takes 2 tablets each am, and 2 tabs in afternoon every other  day       No current facility-administered medications for this visit.     Allergies:  Allergies  Allergen Reactions  . Ace Inhibitors Other (See Comments)    me  hyposion  . Beta Adrenergic Blockers Other (See Comments)     use cautiously secondary to 2nd degree heart block, hypotension    Past Medical History, Surgical history, Social history, and Family History were reviewed and updated.   Physical Exam: Blood pressure 114/47, pulse 82, temperature 96.9 F (36.1 C), temperature source Oral, resp. rate 18, height 5' 10"  (1.778 m), weight 243 lb 3.2 oz (110.315 kg). ECOG: 2 General appearance: alert and cooperative Head: Normocephalic, without obvious abnormality, atraumatic Neck: no adenopathy Lymph nodes: Cervical, supraclavicular, and axillary nodes normal. Heart:regular rate and rhythm, S1, S2 normal, no murmur, click, rub or gallop Lung:chest clear, no wheezing, rales, normal symmetric air entry Abdomin: soft, non-tender, without masses or organomegaly EXT: Edema noted bilaterally.   Lab Results: Lab Results  Component Value Date   WBC 7.5 08/12/2013   HGB 6.2* 08/12/2013   HCT 20.6* 08/12/2013   MCV 93.4 08/12/2013   PLT 298 08/12/2013     Chemistry      Component Value Date/Time   NA 141 07/09/2013 1555   NA 142 05/06/2013 1044   K 4.2 07/09/2013 1555   K 4.0 05/06/2013 1044   CL 107 07/09/2013 1555   CO2 26 07/09/2013 1555   CO2 23 05/06/2013 1044   BUN 64* 07/09/2013 1555   BUN 50.0* 05/06/2013 1044   CREATININE 2.6* 07/09/2013 1555   CREATININE 2.4* 05/06/2013 1044   CREATININE 2.67* 04/22/2013 1801      Component Value Date/Time   CALCIUM 9.3 07/09/2013 1555   CALCIUM 8.5 05/06/2013 1044   ALKPHOS 65 07/09/2013 1555   ALKPHOS 96 05/06/2013 1044   AST 17 07/09/2013 1555   AST 20 05/06/2013 1044   ALT 14 07/09/2013 1555   ALT 27 05/06/2013 1044   BILITOT 0.2 07/09/2013 1555   BILITOT 0.23 05/06/2013 1044       Impression and Plan:  80 year old gentleman with  the following issues:   1. Multifactorial anemia: He has an element of anemia of renal disease as well as iron deficiency anemia. He continues to be transfusion dependent despite Aransep, iron supplement and other interventions. I discussed with him the possibility of having a bone marrow disease such as myelodysplastic syndrome and the need for a bone marrow biopsy to document that. He is agreeable to proceed with that after discussing the risks and benefits. We'll set that up for him for the time being.  2. History of prostate cancer: He is status post prostatectomy  close to 15 years ago.Now he has questionable sclerotic lesions with a PSA of around 1.89.  The differential diagnosis of these lesions were discussed with the patient and his family. The bone marrow biopsy can shed some light on this issue and if not a repeat biopsy of the bone lesions might be warranted.  Followup: Will be in 2 weeks.     [facility], MD 6/24/201510:19 AM

## 2013-08-13 LAB — TYPE AND SCREEN
ABO/RH(D): O POS
ANTIBODY SCREEN: NEGATIVE
UNIT DIVISION: 0
Unit division: 0

## 2013-08-20 ENCOUNTER — Ambulatory Visit (HOSPITAL_COMMUNITY)
Admission: RE | Admit: 2013-08-20 | Discharge: 2013-08-20 | Disposition: A | Discharge status: Home / Self Care | Payer: Medicare PPO | Source: Ambulatory Visit | Attending: Oncology | Admitting: Oncology

## 2013-08-20 DIAGNOSIS — D649 Anemia, unspecified: Secondary | ICD-10-CM

## 2013-08-20 DIAGNOSIS — D539 Nutritional anemia, unspecified: Secondary | ICD-10-CM | POA: Insufficient documentation

## 2013-08-20 DIAGNOSIS — D509 Iron deficiency anemia, unspecified: Secondary | ICD-10-CM | POA: Insufficient documentation

## 2013-08-25 ENCOUNTER — Other Ambulatory Visit: Payer: Self-pay | Admitting: Internal Medicine

## 2013-08-26 ENCOUNTER — Encounter: Payer: Self-pay | Admitting: Oncology

## 2013-08-26 ENCOUNTER — Other Ambulatory Visit (HOSPITAL_BASED_OUTPATIENT_CLINIC_OR_DEPARTMENT_OTHER): Payer: Medicare PPO

## 2013-08-26 ENCOUNTER — Ambulatory Visit (HOSPITAL_BASED_OUTPATIENT_CLINIC_OR_DEPARTMENT_OTHER): Payer: Medicare PPO

## 2013-08-26 ENCOUNTER — Telehealth: Payer: Self-pay | Admitting: Oncology

## 2013-08-26 ENCOUNTER — Ambulatory Visit (HOSPITAL_BASED_OUTPATIENT_CLINIC_OR_DEPARTMENT_OTHER): Payer: Medicare PPO | Admitting: Oncology

## 2013-08-26 VITALS — BP 124/54 | HR 77 | Temp 97.4°F | Resp 20 | Ht 70.0 in | Wt 240.4 lb

## 2013-08-26 VITALS — BP 140/66 | HR 64 | Temp 97.0°F | Resp 18

## 2013-08-26 DIAGNOSIS — Z8546 Personal history of malignant neoplasm of prostate: Secondary | ICD-10-CM

## 2013-08-26 DIAGNOSIS — D509 Iron deficiency anemia, unspecified: Secondary | ICD-10-CM | POA: Diagnosis not present

## 2013-08-26 DIAGNOSIS — D539 Nutritional anemia, unspecified: Secondary | ICD-10-CM

## 2013-08-26 DIAGNOSIS — D649 Anemia, unspecified: Secondary | ICD-10-CM

## 2013-08-26 DIAGNOSIS — D631 Anemia in chronic kidney disease: Secondary | ICD-10-CM

## 2013-08-26 DIAGNOSIS — N289 Disorder of kidney and ureter, unspecified: Secondary | ICD-10-CM

## 2013-08-26 DIAGNOSIS — N039 Chronic nephritic syndrome with unspecified morphologic changes: Principal | ICD-10-CM

## 2013-08-26 DIAGNOSIS — C61 Malignant neoplasm of prostate: Secondary | ICD-10-CM

## 2013-08-26 LAB — CBC WITH DIFFERENTIAL/PLATELET
BASO%: 0.1 % (ref 0.0–2.0)
BASOS ABS: 0 10*3/uL (ref 0.0–0.1)
EOS%: 5.3 % (ref 0.0–7.0)
Eosinophils Absolute: 0.4 10*3/uL (ref 0.0–0.5)
HEMATOCRIT: 25.1 % — AB (ref 38.4–49.9)
HGB: 7.3 g/dL — ABNORMAL LOW (ref 13.0–17.1)
LYMPH%: 11.7 % — ABNORMAL LOW (ref 14.0–49.0)
MCH: 27.8 pg (ref 27.2–33.4)
MCHC: 29.1 g/dL — ABNORMAL LOW (ref 32.0–36.0)
MCV: 95.4 fL (ref 79.3–98.0)
MONO#: 0.5 10*3/uL (ref 0.1–0.9)
MONO%: 7.3 % (ref 0.0–14.0)
NEUT%: 75.6 % — AB (ref 39.0–75.0)
NEUTROS ABS: 5.5 10*3/uL (ref 1.5–6.5)
Platelets: 334 10*3/uL (ref 140–400)
RBC: 2.63 10*6/uL — ABNORMAL LOW (ref 4.20–5.82)
RDW: 17.8 % — AB (ref 11.0–14.6)
WBC: 7.3 10*3/uL (ref 4.0–10.3)
lymph#: 0.9 10*3/uL (ref 0.9–3.3)
nRBC: 0 % (ref 0–0)

## 2013-08-26 LAB — HOLD TUBE, BLOOD BANK

## 2013-08-26 LAB — PREPARE RBC (CROSSMATCH)

## 2013-08-26 MED ORDER — ACETAMINOPHEN 325 MG PO TABS
650.0000 mg | ORAL_TABLET | Freq: Once | ORAL | Status: AC
Start: 2013-08-26 — End: 2013-08-26
  Administered 2013-08-26: 650 mg via ORAL

## 2013-08-26 MED ORDER — DIPHENHYDRAMINE HCL 25 MG PO CAPS
ORAL_CAPSULE | ORAL | Status: AC
Start: 2013-08-26 — End: 2013-08-26
  Filled 2013-08-26: qty 2

## 2013-08-26 MED ORDER — HEPARIN SOD (PORK) LOCK FLUSH 100 UNIT/ML IV SOLN
500.0000 [IU] | Freq: Every day | INTRAVENOUS | Status: DC | PRN
  Filled 2013-08-26: qty 5

## 2013-08-26 MED ORDER — SODIUM CHLORIDE 0.9 % IJ SOLN
10.0000 mL | INTRAMUSCULAR | Status: DC | PRN
Start: 2013-08-26 — End: 2013-08-26
  Filled 2013-08-26: qty 10

## 2013-08-26 MED ORDER — SODIUM CHLORIDE 0.9 % IJ SOLN
10.0000 mL | INTRAMUSCULAR | Status: DC | PRN
  Filled 2013-08-26: qty 10

## 2013-08-26 MED ORDER — SODIUM CHLORIDE 0.9 % IV SOLN
250.0000 mL | Freq: Once | INTRAVENOUS | Status: AC
  Administered 2013-08-26: 250 mL via INTRAVENOUS

## 2013-08-26 MED ORDER — DIPHENHYDRAMINE HCL 25 MG PO CAPS
25.0000 mg | ORAL_CAPSULE | Freq: Once | ORAL | Status: AC
  Administered 2013-08-26: 25 mg via ORAL

## 2013-08-26 MED ORDER — ACETAMINOPHEN 325 MG PO TABS
ORAL_TABLET | ORAL | Status: AC
  Filled 2013-08-26: qty 2

## 2013-08-26 NOTE — Patient Instructions (Signed)

## 2013-08-26 NOTE — Progress Notes (Signed)
Hematology and Oncology Follow Up Visit  [name] [id number] April 18, 1932 80 y.o. 08/26/2013 10:15 AM [name], MDJohn, Travis Oris, MD   Principle Diagnosis: 80 year old gentleman with multifactorial anemia he has an element of anemia of chronic disease as well as iron deficiency anemia. This was diagnosed in March of 2015. He also has questionable sclerotic bone lesions that were biopsied without any specific diagnosis made.  Current therapy: Supportive transfusions every 2-3 weeks as needed as well Aranesp 300 mcg every 2 weeks.  Interim History:  [name] presents today for a followup visit with his daughter. Since his last visit, he continued to be stable clinically but requiring recurrent transfusions. He is able to ambulate with the help of a walker but for the most part his wheelchair bound. He has not had any falls or illnesses. He has not reported any complications from Aranesp or transfusions. He is not reporting any chest pain shortness of breath or difficulty breathing. He is not reporting any back pain shoulder pain or any bone pain at this time. He had not reported any hospitalizations or illnesses. Has not reported any headaches or blurry vision or double vision. Has not reported any nausea or vomiting or abdominal pain. Has not reported any frequency urgency or hesitancy. Rest or view of system is unremarkable.  Medications: I have reviewed the patient's current medications.  Current Outpatient Prescriptions  Medication Sig Dispense Refill  . allopurinol (ZYLOPRIM) 100 MG tablet Take 1 tablet (100 mg total) by mouth at bedtime.  30 tablet  3  . amiodarone (PACERONE) 200 MG tablet Take 0.5 tablets (100 mg total) by mouth every morning.  15 tablet  6  . amLODipine (NORVASC) 10 MG tablet Take 1 tablet (10 mg total) by mouth daily.  30 tablet  6  . aspirin EC 81 MG EC tablet Take 1 tablet (81 mg total) by mouth daily.      Marland Kitchen atorvastatin (LIPITOR) 40 MG tablet TAKE 1 TABLET BY  MOUTH EVERY DAY  30 tablet  6  . Blood Glucose Monitoring Suppl (ACCU-CHEK AVIVA PLUS) W/DEVICE KIT Use as directed to check blood sugar.  Diagnosis code 250.02  1 kit  0  . citalopram (CELEXA) 10 MG tablet Take 10 mg by mouth 2 (two) times daily.      . citalopram (CELEXA) 10 MG tablet TAKE 1 TABLET BY MOUTH 2 TIMES DAILY.  60 tablet  11  . cloNIDine (CATAPRES - DOSED IN MG/24 HR) 0.1 mg/24hr patch Place 1 patch (0.1 mg total) onto the skin once a week. Change on sundays, MUST HAVE FOLLOW UP APPOINTMENT FOR FURTHER REFILLS  4 patch  2  . Fe Fum-Vit C-Vit B12-FA (TRIGELS-F) 460-60-0.01-1 MG CAPS capsule Take 1 capsule by mouth daily.  30 capsule  6  . Fluticasone-Salmeterol (ADVAIR DISKUS) 100-50 MCG/DOSE AEPB Inhale 1 puff into the lungs 2 (two) times daily.  1 each  0  . glucose blood (ACCU-CHEK AVIVA PLUS) test strip Use as directed twice daily to check blood sugar.  Diagnosis code 250.02  100 each  11  . hydrALAZINE (APRESOLINE) 100 MG tablet TAKE 1 TABLET BY MOUTH 3 TIMES A DAY  90 tablet  6  . Insulin Detemir (LEVEMIR FLEXTOUCH) 100 UNIT/ML Pen INJECT 1.25 (125 units) ML INTO THE SKIN EVERY MORNING  15 pen  1  . insulin detemir (LEVEMIR) 100 UNIT/ML injection Inject 125 Units into the skin every morning.      . isosorbide mononitrate (IMDUR) 60  MG 24 hr tablet Take 1 tablet (60 mg total) by mouth daily.  90 tablet  3  . Lancets (ACCU-CHEK SOFT TOUCH) lancets Use as instructed twice daily to check blood sugar.  Diagnosis code 250.02  100 each  11  . levothyroxine (SYNTHROID, LEVOTHROID) 125 MCG tablet TAKE 1 TABLET BY MOUTH ONCE EVERY DAY  30 tablet  11  . mupirocin ointment (BACTROBAN) 2 % Use qd with dressing change  30 g  0  . omeprazole (PRILOSEC) 20 MG capsule Take 2 capsules (40 mg total) by mouth daily.  30 capsule  6  . potassium chloride SA (K-DUR,KLOR-CON) 20 MEQ tablet Take 1 tablet (20 mEq total) by mouth daily.  30 tablet  6  . torsemide (DEMADEX) 20 MG tablet Take 20 mg by mouth  daily. Takes 2 tablets each am, and 2 tabs in afternoon every other day       No current facility-administered medications for this visit.     Allergies:  Allergies  Allergen Reactions  . Ace Inhibitors Other (See Comments)    me  hyposion  . Beta Adrenergic Blockers Other (See Comments)     use cautiously secondary to 2nd degree heart block, hypotension    Past Medical History, Surgical history, Social history, and Family History were reviewed and updated.   Physical Exam: Blood pressure 124/54, pulse 77, temperature 97.4 F (36.3 C), temperature source Oral, resp. rate 20, height 5' 10"  (1.778 m), weight 240 lb 6.4 oz (109.045 kg), SpO2 98.00%. ECOG: 2 General appearance: alert and cooperative not in any distress. Head: Normocephalic, without obvious abnormality, atraumatic Neck: no adenopathy Lymph nodes: Cervical, supraclavicular, and axillary nodes normal. Heart:regular rate and rhythm, S1, S2 normal, no murmur, click, rub or gallop Lung:chest clear, no wheezing, rales, normal symmetric air entry Abdomin: soft, non-tender, without masses or organomegaly EXT: Trace edema noted.   Lab Results: Lab Results  Component Value Date   WBC 7.3 08/26/2013   HGB 7.3* 08/26/2013   HCT 25.1* 08/26/2013   MCV 95.4 08/26/2013   PLT 334 08/26/2013     Chemistry      Component Value Date/Time   NA 141 07/09/2013 1555   NA 142 05/06/2013 1044   K 4.2 07/09/2013 1555   K 4.0 05/06/2013 1044   CL 107 07/09/2013 1555   CO2 26 07/09/2013 1555   CO2 23 05/06/2013 1044   BUN 64* 07/09/2013 1555   BUN 50.0* 05/06/2013 1044   CREATININE 2.6* 07/09/2013 1555   CREATININE 2.4* 05/06/2013 1044   CREATININE 2.67* 04/22/2013 1801      Component Value Date/Time   CALCIUM 9.3 07/09/2013 1555   CALCIUM 8.5 05/06/2013 1044   ALKPHOS 65 07/09/2013 1555   ALKPHOS 96 05/06/2013 1044   AST 17 07/09/2013 1555   AST 20 05/06/2013 1044   ALT 14 07/09/2013 1555   ALT 27 05/06/2013 1044   BILITOT 0.2 07/09/2013 1555    BILITOT 0.23 05/06/2013 1044       Impression and Plan:  80 year old gentleman with the following issues:   1. Multifactorial anemia: He has an element of anemia of renal disease as well as iron deficiency anemia. He continues to be transfusion dependent despite Aransep, iron supplement and other interventions. He is agreeable to proceed with a bone marrow biopsy which is scheduled on 09/09/2013.  2. History of prostate cancer: He is status post prostatectomy close to 15 years ago.Now he has questionable sclerotic lesions with a PSA of around  1.89.  The differential diagnosis of these lesions were discussed with the patient and his family. The bone marrow biopsy can shed some light on this issue and if not a repeat biopsy of the bone lesions might be warranted.  Followup: Will be in 2 weeks for a blood transfusion and 2 weeks after that to discuss his bone marrow biopsy.    [facility], MD 7/8/201510:15 AM

## 2013-08-26 NOTE — Telephone Encounter (Signed)
gv adn prnted appt sched and avs for pt for July adn Aug...sed added tx.

## 2013-08-27 LAB — TYPE AND SCREEN
ABO/RH(D): O POS
ANTIBODY SCREEN: NEGATIVE
UNIT DIVISION: 0
Unit division: 0

## 2013-09-07 ENCOUNTER — Other Ambulatory Visit: Payer: Self-pay

## 2013-09-07 MED ORDER — POTASSIUM CHLORIDE CRYS ER 20 MEQ PO TBCR: 20.0000 meq | EXTENDED_RELEASE_TABLET | Freq: Every day | ORAL | Status: DC

## 2013-09-08 ENCOUNTER — Other Ambulatory Visit: Payer: Self-pay | Admitting: Radiology

## 2013-09-08 ENCOUNTER — Other Ambulatory Visit: Payer: Self-pay

## 2013-09-08 MED ORDER — ALLOPURINOL 100 MG PO TABS: 100.0000 mg | ORAL_TABLET | Freq: Every day | ORAL | Status: DC

## 2013-09-09 ENCOUNTER — Ambulatory Visit (HOSPITAL_COMMUNITY)
Admission: RE | Admit: 2013-09-09 | Discharge: 2013-09-09 | Disposition: A | Discharge status: Home / Self Care | Payer: Medicare PPO | Source: Ambulatory Visit | Attending: Oncology | Admitting: Oncology

## 2013-09-09 ENCOUNTER — Encounter (HOSPITAL_COMMUNITY): Payer: Self-pay

## 2013-09-09 DIAGNOSIS — E119 Type 2 diabetes mellitus without complications: Secondary | ICD-10-CM | POA: Insufficient documentation

## 2013-09-09 DIAGNOSIS — I2510 Atherosclerotic heart disease of native coronary artery without angina pectoris: Secondary | ICD-10-CM | POA: Diagnosis not present

## 2013-09-09 DIAGNOSIS — C7952 Secondary malignant neoplasm of bone marrow: Secondary | ICD-10-CM | POA: Diagnosis not present

## 2013-09-09 DIAGNOSIS — Z794 Long term (current) use of insulin: Secondary | ICD-10-CM | POA: Insufficient documentation

## 2013-09-09 DIAGNOSIS — D649 Anemia, unspecified: Secondary | ICD-10-CM | POA: Diagnosis present

## 2013-09-09 DIAGNOSIS — Z79899 Other long term (current) drug therapy: Secondary | ICD-10-CM | POA: Insufficient documentation

## 2013-09-09 DIAGNOSIS — Z8673 Personal history of transient ischemic attack (TIA), and cerebral infarction without residual deficits: Secondary | ICD-10-CM | POA: Diagnosis not present

## 2013-09-09 DIAGNOSIS — D509 Iron deficiency anemia, unspecified: Secondary | ICD-10-CM

## 2013-09-09 DIAGNOSIS — G4733 Obstructive sleep apnea (adult) (pediatric): Secondary | ICD-10-CM | POA: Insufficient documentation

## 2013-09-09 DIAGNOSIS — E785 Hyperlipidemia, unspecified: Secondary | ICD-10-CM | POA: Diagnosis not present

## 2013-09-09 DIAGNOSIS — I129 Hypertensive chronic kidney disease with stage 1 through stage 4 chronic kidney disease, or unspecified chronic kidney disease: Secondary | ICD-10-CM | POA: Diagnosis not present

## 2013-09-09 DIAGNOSIS — C61 Malignant neoplasm of prostate: Secondary | ICD-10-CM | POA: Insufficient documentation

## 2013-09-09 DIAGNOSIS — I5032 Chronic diastolic (congestive) heart failure: Secondary | ICD-10-CM | POA: Diagnosis not present

## 2013-09-09 DIAGNOSIS — I4891 Unspecified atrial fibrillation: Secondary | ICD-10-CM | POA: Insufficient documentation

## 2013-09-09 DIAGNOSIS — C7951 Secondary malignant neoplasm of bone: Secondary | ICD-10-CM | POA: Diagnosis not present

## 2013-09-09 DIAGNOSIS — N183 Chronic kidney disease, stage 3 unspecified: Secondary | ICD-10-CM | POA: Diagnosis not present

## 2013-09-09 LAB — CBC
HCT: 23.5 % — ABNORMAL LOW (ref 39.0–52.0)
HEMOGLOBIN: 7 g/dL — AB (ref 13.0–17.0)
MCH: 28.7 pg (ref 26.0–34.0)
MCHC: 29.8 g/dL — ABNORMAL LOW (ref 30.0–36.0)
MCV: 96.3 fL (ref 78.0–100.0)
Platelets: 341 10*3/uL (ref 150–400)
RBC: 2.44 MIL/uL — ABNORMAL LOW (ref 4.22–5.81)
RDW: 18.1 % — AB (ref 11.5–15.5)
WBC: 9 10*3/uL (ref 4.0–10.5)

## 2013-09-09 LAB — PROTIME-INR
INR: 1.11 (ref 0.00–1.49)
Prothrombin Time: 14.3 seconds (ref 11.6–15.2)

## 2013-09-09 LAB — BONE MARROW EXAM

## 2013-09-09 LAB — APTT: aPTT: 37 seconds (ref 24–37)

## 2013-09-09 LAB — GLUCOSE, CAPILLARY: Glucose-Capillary: 143 mg/dL — ABNORMAL HIGH (ref 70–99)

## 2013-09-09 MED ORDER — MIDAZOLAM HCL 2 MG/2ML IJ SOLN
INTRAMUSCULAR | Status: AC | PRN
  Administered 2013-09-09: 0.5 mg via INTRAVENOUS

## 2013-09-09 MED ORDER — MIDAZOLAM HCL 2 MG/2ML IJ SOLN
INTRAMUSCULAR | Status: AC
  Filled 2013-09-09: qty 4

## 2013-09-09 MED ORDER — FENTANYL CITRATE 0.05 MG/ML IJ SOLN
INTRAMUSCULAR | Status: AC | PRN
  Administered 2013-09-09: 50 ug via INTRAVENOUS

## 2013-09-09 MED ORDER — SODIUM CHLORIDE 0.9 % IV SOLN
INTRAVENOUS | Status: DC
  Administered 2013-09-09: 500 mL via INTRAVENOUS

## 2013-09-09 MED ORDER — FENTANYL CITRATE 0.05 MG/ML IJ SOLN
INTRAMUSCULAR | Status: AC
  Filled 2013-09-09: qty 4

## 2013-09-09 NOTE — H&P (Signed)
Agree.  Patient seen.  For CT guided BM biopsy.

## 2013-09-09 NOTE — Procedures (Signed)
Procedure:  CT guided bone marrow biopsy Findings:  Right iliac aspirate and 11 G core biopsy performed. No complications.

## 2013-09-09 NOTE — Progress Notes (Signed)
Pt has multiple ulcers bilateral toes.  Blisters on lower extremities,bilateral, states due to diabetes.  Daughter states she will see endocrinologist at [facility] soon and is seen at cancer center

## 2013-09-09 NOTE — H&P (Signed)
Chief Complaint: "I'm here for a bone marrow biopsy" Referring Physician:Shadad HPI: [name] is an 80 y.o. male with multiple medical problems who is undergoing workup for chronic anemia. He is referred for bone marrow biopsy. PMHx, meds, labs reviewed.   Past Medical History:  Past Medical History  Diagnosis Date  . Chronic diastolic heart failure     secondary to diastolic dysfunction EF (previous EF 35-45%)ef 60%4/09  . Arrhythmia     atrial fibrillation /pt on amiodarone,thought to be poor coumadin  . Stroke   . Other and unspecified hyperlipidemia   . Hypertension   . Hypertrophy of prostate without urinary obstruction and other lower urinary tract symptoms (LUTS)   . Osteoarthrosis, unspecified whether generalized or localized, unspecified site   . Type II or unspecified type diabetes mellitus without mention of complication, not stated as uncontrolled   . AV block, Mobitz 1     Intolerance to ACE's / discontiuation of beta blockers  . Obstructive sleep apnea   . Iron deficiency anemia, unspecified     s/p EGD and coloscopy 3/10.gastritis.hemorrhids  . Cerebrovascular accident   . Renal insufficiency   . Obesity   . Allergic rhinitis, cause unspecified 05/29/2010  . CAD (coronary artery disease)     cath 12/04: mLAD 50-60%, mCFX 20-30%, pRCA 50-60%, mRCA 40%  . Normochromic normocytic anemia 04/21/2013  . Abnormal CXR 04/21/2013  . Hypertensive kidney disease with CKD stage III 04/21/2013  . Prostate cancer     Past Surgical History:  Past Surgical History  Procedure Laterality Date  . Prostate surgery    . Prostate surgury      hx of  . Esophagogastroduodenoscopy N/A 04/21/2013    Procedure: ESOPHAGOGASTRODUODENOSCOPY (EGD);  Surgeon: Irene Shipper, MD;  Location: [facility];  Service: Endoscopy;  Laterality: N/A;    Family History:  Family History  Problem Relation Age of Onset  . Heart disease Father     Social History:  reports that he has quit smoking.  He has never used smokeless tobacco. He reports that he does not drink alcohol or use illicit drugs.  Allergies:  Allergies  Allergen Reactions  . Ace Inhibitors Other (See Comments)    me  hyposion  . Beta Adrenergic Blockers Other (See Comments)     use cautiously secondary to 2nd degree heart block, hypotension    Medications:   Medication List    ASK your doctor about these medications       allopurinol 100 MG tablet  Commonly known as:  ZYLOPRIM  Take 1 tablet (100 mg total) by mouth at bedtime.     amiodarone 200 MG tablet  Commonly known as:  PACERONE  Take 0.5 tablets (100 mg total) by mouth every morning.     amLODipine 10 MG tablet  Commonly known as:  NORVASC  Take 1 tablet (10 mg total) by mouth daily.     aspirin 81 MG EC tablet  Take 1 tablet (81 mg total) by mouth daily.     atorvastatin 40 MG tablet  Commonly known as:  LIPITOR  Take 40 mg by mouth daily.     citalopram 10 MG tablet  Commonly known as:  CELEXA  Take 20 mg by mouth daily.     cloNIDine 0.1 mg/24hr patch  Commonly known as:  CATAPRES - Dosed in mg/24 hr  Place 0.1 mg onto the skin once a week. Changes patch on Thursday's     darbepoetin 300 MCG/0.6ML Soln  injection  Commonly known as:  ARANESP  Inject 300 mcg into the skin every 14 (fourteen) days.     Fe Fum-Vit C-Vit B12-FA 460-60-0.01-1 MG Caps capsule  Commonly known as:  TRIGELS-F  Take 1 capsule by mouth daily.     Fluticasone-Salmeterol 100-50 MCG/DOSE Aepb  Commonly known as:  ADVAIR DISKUS  Inhale 1 puff into the lungs 2 (two) times daily.     insulin detemir 100 UNIT/ML injection  Commonly known as:  LEVEMIR  Inject 125 Units into the skin every morning.     isosorbide mononitrate 60 MG 24 hr tablet  Commonly known as:  IMDUR  Take 1 tablet (60 mg total) by mouth daily.     omeprazole 20 MG capsule  Commonly known as:  PRILOSEC  Take 2 capsules (40 mg total) by mouth daily.     potassium chloride SA 20 MEQ  tablet  Commonly known as:  K-DUR,KLOR-CON  Take 1 tablet (20 mEq total) by mouth daily.     torsemide 20 MG tablet  Commonly known as:  DEMADEX  Take 40 mg by mouth daily. Takes 2 tablets each am, and 2 tabs in afternoon every other day        Please HPI for pertinent positives, otherwise complete 10 system ROS negative.  Physical Exam: BP 150/83  Pulse 74  Temp(Src) 98.1 F (36.7 C) (Oral)  Resp 18  Ht 5' 10"  (1.778 m)  Wt 240 lb (108.863 kg)  BMI 34.44 kg/m2  SpO2 96% Body mass index is 34.44 kg/(m^2).   General Appearance:  Alert, cooperative, no distress, appears stated age  Head:  Normocephalic, without obvious abnormality, atraumatic  ENT: Unremarkable  Neck: Supple, symmetrical, trachea midline  Lungs:   Clear to auscultation bilaterally, no w/r/r, respirations unlabored without use of accessory muscles.  Chest Wall:  No tenderness or deformity  Heart:  Regular rate and rhythm, S1, S2 normal, no murmur, rub or gallop.  Neurologic: Normal affect, no gross deficits.   Results for orders placed during the hospital encounter of 09/09/13 (from the past 48 hour(s))  APTT     Status: None   Collection Time    09/09/13  7:20 AM      Result Value Ref Range   aPTT 37  24 - 37 seconds   Comment:            IF BASELINE aPTT IS ELEVATED,     SUGGEST PATIENT RISK ASSESSMENT     BE USED TO DETERMINE APPROPRIATE     ANTICOAGULANT THERAPY.  CBC     Status: Abnormal   Collection Time    09/09/13  7:20 AM      Result Value Ref Range   WBC 9.0  4.0 - 10.5 K/uL   RBC 2.44 (*) 4.22 - 5.81 MIL/uL   Hemoglobin 7.0 (*) 13.0 - 17.0 g/dL   HCT 23.5 (*) 39.0 - 52.0 %   MCV 96.3  78.0 - 100.0 fL   MCH 28.7  26.0 - 34.0 pg   MCHC 29.8 (*) 30.0 - 36.0 g/dL   RDW 18.1 (*) 11.5 - 15.5 %   Platelets 341  150 - 400 K/uL  PROTIME-INR     Status: None   Collection Time    09/09/13  7:20 AM      Result Value Ref Range   Prothrombin Time 14.3  11.6 - 15.2 seconds   INR 1.11  0.00 -  1.49   No results found.  Assessment/Plan Chronic anemia For CT guided bone marrow biopsy Discussed procedure, risks, complications, use of sedation with pt and family. Labs reviewed Consent signed in chart  Ascencion Dike PA-C 09/09/2013, 8:21 AM

## 2013-09-09 NOTE — Discharge Instructions (Signed)

## 2013-09-10 ENCOUNTER — Other Ambulatory Visit: Payer: Self-pay | Admitting: *Deleted

## 2013-09-11 ENCOUNTER — Other Ambulatory Visit: Payer: Self-pay | Admitting: Medical Oncology

## 2013-09-11 ENCOUNTER — Other Ambulatory Visit: Payer: Self-pay | Admitting: *Deleted

## 2013-09-11 ENCOUNTER — Other Ambulatory Visit (HOSPITAL_BASED_OUTPATIENT_CLINIC_OR_DEPARTMENT_OTHER): Payer: Medicare PPO

## 2013-09-11 ENCOUNTER — Ambulatory Visit (HOSPITAL_BASED_OUTPATIENT_CLINIC_OR_DEPARTMENT_OTHER): Payer: Medicare PPO

## 2013-09-11 VITALS — BP 140/64 | HR 72 | Temp 97.2°F | Resp 18

## 2013-09-11 DIAGNOSIS — D509 Iron deficiency anemia, unspecified: Secondary | ICD-10-CM | POA: Diagnosis not present

## 2013-09-11 DIAGNOSIS — C61 Malignant neoplasm of prostate: Secondary | ICD-10-CM

## 2013-09-11 DIAGNOSIS — D649 Anemia, unspecified: Secondary | ICD-10-CM

## 2013-09-11 DIAGNOSIS — N183 Chronic kidney disease, stage 3 unspecified: Secondary | ICD-10-CM

## 2013-09-11 DIAGNOSIS — D539 Nutritional anemia, unspecified: Secondary | ICD-10-CM

## 2013-09-11 DIAGNOSIS — D638 Anemia in other chronic diseases classified elsewhere: Secondary | ICD-10-CM

## 2013-09-11 DIAGNOSIS — D631 Anemia in chronic kidney disease: Secondary | ICD-10-CM

## 2013-09-11 DIAGNOSIS — N039 Chronic nephritic syndrome with unspecified morphologic changes: Secondary | ICD-10-CM

## 2013-09-11 LAB — CBC WITH DIFFERENTIAL/PLATELET
BASO%: 0.1 % (ref 0.0–2.0)
BASOS ABS: 0 10*3/uL (ref 0.0–0.1)
EOS ABS: 0.3 10*3/uL (ref 0.0–0.5)
EOS%: 3.5 % (ref 0.0–7.0)
HEMATOCRIT: 20.1 % — AB (ref 38.4–49.9)
HEMOGLOBIN: 6 g/dL — AB (ref 13.0–17.1)
LYMPH#: 0.8 10*3/uL — AB (ref 0.9–3.3)
LYMPH%: 10.1 % — ABNORMAL LOW (ref 14.0–49.0)
MCH: 28.1 pg (ref 27.2–33.4)
MCHC: 29.6 g/dL — ABNORMAL LOW (ref 32.0–36.0)
MCV: 94.8 fL (ref 79.3–98.0)
MONO#: 0.7 10*3/uL (ref 0.1–0.9)
MONO%: 8.1 % (ref 0.0–14.0)
NEUT%: 78.2 % — ABNORMAL HIGH (ref 39.0–75.0)
NEUTROS ABS: 6.4 10*3/uL (ref 1.5–6.5)
Platelets: 320 10*3/uL (ref 140–400)
RBC: 2.12 10*6/uL — ABNORMAL LOW (ref 4.20–5.82)
RDW: 19.2 % — AB (ref 11.0–14.6)
WBC: 8.2 10*3/uL (ref 4.0–10.3)

## 2013-09-11 LAB — PREPARE RBC (CROSSMATCH)

## 2013-09-11 LAB — HOLD TUBE, BLOOD BANK

## 2013-09-11 MED ORDER — DARBEPOETIN ALFA-POLYSORBATE 300 MCG/0.6ML IJ SOLN
300.0000 ug | Freq: Once | INTRAMUSCULAR | Status: AC
  Administered 2013-09-11: 300 ug via SUBCUTANEOUS
  Filled 2013-09-11: qty 0.6

## 2013-09-11 MED ORDER — ACETAMINOPHEN 325 MG PO TABS
ORAL_TABLET | ORAL | Status: AC
  Filled 2013-09-11: qty 2

## 2013-09-11 MED ORDER — DIPHENHYDRAMINE HCL 25 MG PO CAPS
25.0000 mg | ORAL_CAPSULE | Freq: Once | ORAL | Status: AC
  Administered 2013-09-11: 25 mg via ORAL

## 2013-09-11 MED ORDER — SODIUM CHLORIDE 0.9 % IV SOLN
250.0000 mL | Freq: Once | INTRAVENOUS | Status: AC
  Administered 2013-09-11: 250 mL via INTRAVENOUS

## 2013-09-11 MED ORDER — DIPHENHYDRAMINE HCL 25 MG PO CAPS
ORAL_CAPSULE | ORAL | Status: AC
  Filled 2013-09-11: qty 1

## 2013-09-11 MED ORDER — ACETAMINOPHEN 325 MG PO TABS
650.0000 mg | ORAL_TABLET | Freq: Once | ORAL | Status: AC
  Administered 2013-09-11: 650 mg via ORAL

## 2013-09-11 NOTE — Patient Instructions (Signed)

## 2013-09-12 LAB — TYPE AND SCREEN
ABO/RH(D): O POS
ANTIBODY SCREEN: NEGATIVE
UNIT DIVISION: 0
Unit division: 0

## 2013-09-15 ENCOUNTER — Encounter: Payer: Self-pay | Admitting: Internal Medicine

## 2013-09-15 ENCOUNTER — Ambulatory Visit (INDEPENDENT_AMBULATORY_CARE_PROVIDER_SITE_OTHER): Payer: Medicare PPO | Admitting: Internal Medicine

## 2013-09-15 ENCOUNTER — Telehealth: Payer: Self-pay | Admitting: Oncology

## 2013-09-15 VITALS — BP 122/62 | HR 75 | Temp 98.6°F | Wt 240.0 lb

## 2013-09-15 DIAGNOSIS — Z7409 Other reduced mobility: Secondary | ICD-10-CM

## 2013-09-15 DIAGNOSIS — R609 Edema, unspecified: Secondary | ICD-10-CM

## 2013-09-15 DIAGNOSIS — Z8679 Personal history of other diseases of the circulatory system: Secondary | ICD-10-CM

## 2013-09-15 DIAGNOSIS — H9193 Unspecified hearing loss, bilateral: Secondary | ICD-10-CM

## 2013-09-15 DIAGNOSIS — N183 Chronic kidney disease, stage 3 unspecified: Secondary | ICD-10-CM

## 2013-09-15 DIAGNOSIS — R69 Illness, unspecified: Secondary | ICD-10-CM

## 2013-09-15 DIAGNOSIS — I10 Essential (primary) hypertension: Secondary | ICD-10-CM

## 2013-09-15 DIAGNOSIS — H9190 Unspecified hearing loss, unspecified ear: Secondary | ICD-10-CM

## 2013-09-15 NOTE — Patient Instructions (Signed)
Your ears were irrigated of wax today  You are given the prescrptions for the compression stockings, and the motorized powerchair with leg elevation  Please continue all other medications as before, and refills have been done if requested.  Please have the pharmacy call with any other refills you may need.  Please continue your efforts at being more active, low cholesterol diet, and weight control.  Please keep your appointments with your specialists as you may have planned  Try to keep your legs elevated if sitting  Please return in 6 months, or sooner if needed

## 2013-09-15 NOTE — Assessment & Plan Note (Signed)
Nov 2013 echo with ok EF, most Likely related to ckd/anemia/venous insufficiency - for leg elevation, compression stockings

## 2013-09-15 NOTE — Progress Notes (Signed)
Subjective:    Patient ID: [name], male    DOB: 1932-05-05, 80 y.o.   MRN: [id number]  HPI    Here to f/u; overall doing ok,  Pt denies chest pain, increased sob or doe, wheezing, orthopnea, PND, palpitations, dizziness or syncope, has ongoing bilat LE edema, worse by nightfall, better in am after legs elevated overnight..  Pt denies polydipsia, polyuria, or low sugar symptoms such as weakness or confusion improved with po intake.  Pt denies new neurological symptoms such as new headache, or facial or extremity weakness or numbness.   Pt states overall good compliance with meds, has been trying to follow lower cholesterol, diabetic diet, with wt overall stable,  but little exercise however.  No overt bleeding or blood loss.  Has also bilat hearing loss  - ? Wax impaction, needs irrigated today Also here for facetoface for evaluation for new power wheelchair, current one approx over 71 yrs old, qualifies for new one at 10 yrs.  Pt has cont'd left sided weakness related to stroke that severely limits ambulatory ability.  Is on preventive medication for recurrent stroke.  Exam has not progressed, but stable and severe no change.    Also hx of cervical radiculitis recurrent symptoms,  Chronic respiratory failure, chronic diastolic heart failure, aftrial fibrillation, Hypertension, peripheral neuropathy, osteoarthritis, prostate cancer, overall general weakness, all of which limit his mobility as well. Pt can only walk less than 5 ft with assist, and requires assist with transfers as well. ADL's severely impaired, with grooming, bathing, moving from room to room, dressing and toileting, though can feed himself.  Unable to be assisted with cane or walker due to co-morbids.  No recent falls due to ongoing powerchair use, but remains at high risk. Pt unable to use manual wheelchair due to LUE weakness due to stroke. Pt home is capable of supporting use of chair as he has been doing, and pt able and willing to  do so. Pt unable to use Power scooter due to need for leg elevation. Review of Systems  Constitutional: Negative for unusual diaphoresis or other sweats  HENT: Negative for ringing in ear Eyes: Negative for double vision or worsening visual disturbance.  Respiratory: Negative for choking and stridor.   Gastrointestinal: Negative for vomiting or other signifcant bowel change Genitourinary: Negative for hematuria or decreased urine volume.  Musculoskeletal: Negative for other MSK pain or swelling Skin: Negative for color change and worsening wound.  Neurological: Negative for tremors and numbness other than noted  Psychiatric/Behavioral: Negative for decreased concentration or agitation other than above       Objective:   Physical Exam BP 122/62  Pulse 75  Temp(Src) 98.6 F (37 C) (Oral)  Wt 240 lb (108.863 kg)  SpO2 94% VS noted, examined in powerchair Constitutional: Pt appears well-developed, well-nourished.  HENT: Head: NCAT.  Right Ear: External ear normal.  Left Ear: External ear normal.  Eyes: . Pupils are equal, round, and reactive to light. Conjunctivae and EOM are normal Neck: Normal range of motion. Neck supple.  Cardiovascular: Normal rate and regular rhythm.   Pulmonary/Chest: Effort normal and breath sounds normal.  Abd:  Soft, NT, ND, + BS Neurological: Pt is alert. Not confused , motor with 3+/5 LUE/LLE weakness, ROM decreased bilateral shoulders to 70 degrees, unable to test gait, balance and coordination due to weakness and high risk of fall, poor endurance Skin: Skin is warm. No rash Psychiatric: Pt behavior is normal. No agitation.  2+ edema  to knees with blebs/non ulcerated to mid lower leg, no s/s infection    Assessment & Plan:

## 2013-09-15 NOTE — Progress Notes (Signed)
Pre visit review using our clinic review tool, if applicable. No additional management support is needed unless otherwise documented below in the visit note. 

## 2013-09-15 NOTE — Assessment & Plan Note (Signed)
With left sided weakness, old chair now close to 80 yo per son, for rx for motorized powerchair/leg elevation

## 2013-09-15 NOTE — Telephone Encounter (Signed)
returned dtr's call and confirmed appt for 8/5. schedule mailed. dtr [name].

## 2013-09-18 LAB — CHROMOSOME ANALYSIS, BONE MARROW

## 2013-09-18 LAB — TISSUE HYBRIDIZATION (BONE MARROW)-NCBH

## 2013-09-19 DIAGNOSIS — Z7409 Other reduced mobility: Secondary | ICD-10-CM | POA: Insufficient documentation

## 2013-09-19 NOTE — Assessment & Plan Note (Signed)
stable overall by history and exam, recent data reviewed with pt, and pt to continue medical treatment as before,  to f/u any worsening symptoms or concerns BP Readings from Last 3 Encounters:  09/15/13 122/62  09/11/13 140/64  09/09/13 139/49

## 2013-09-19 NOTE — Assessment & Plan Note (Addendum)
Exam as documented, and rx given son for new powerwheelchair  Note:  Total time for pt hx, exam, review of record with pt in the room, determination of diagnoses and plan for further eval and tx is > 40 min, with over 50% spent in coordination and counseling of patient

## 2013-09-19 NOTE — Assessment & Plan Note (Signed)
stable overall by history and exam, recent data reviewed with pt, and pt to continue medical treatment as before,  to f/u any worsening symptoms or concerns Lab Results  Component Value Date   WBC 8.2 09/11/2013   HGB 6.0* 09/11/2013   HCT 20.1* 09/11/2013   PLT 320 09/11/2013   GLUCOSE 76 07/09/2013   CHOL 110 07/09/2013   TRIG 124.0 07/09/2013   HDL 34.70* 07/09/2013   LDLDIRECT 83.9 09/19/2009   LDLCALC 51 07/09/2013   ALT 14 07/09/2013   AST 17 07/09/2013   NA 141 07/09/2013   K 4.2 07/09/2013   CL 107 07/09/2013   CREATININE 2.6* 07/09/2013   BUN 64* 07/09/2013   CO2 26 07/09/2013   TSH 2.33 09/21/2010   PSA 1.89 04/22/2013   INR 1.11 09/09/2013   HGBA1C 5.0 07/09/2013   MICROALBUR 56.6* 11/27/2010

## 2013-09-19 NOTE — Assessment & Plan Note (Signed)
Improved with bilat wax impaction irrigations

## 2013-09-21 ENCOUNTER — Ambulatory Visit (HOSPITAL_COMMUNITY)
Admission: RE | Admit: 2013-09-21 | Discharge: 2013-09-21 | Disposition: A | Discharge status: Home / Self Care | Payer: Medicare PPO | Source: Ambulatory Visit | Attending: Oncology | Admitting: Oncology

## 2013-09-21 DIAGNOSIS — Z7982 Long term (current) use of aspirin: Secondary | ICD-10-CM | POA: Insufficient documentation

## 2013-09-21 DIAGNOSIS — N189 Chronic kidney disease, unspecified: Secondary | ICD-10-CM | POA: Insufficient documentation

## 2013-09-21 DIAGNOSIS — D631 Anemia in chronic kidney disease: Secondary | ICD-10-CM | POA: Insufficient documentation

## 2013-09-21 DIAGNOSIS — I129 Hypertensive chronic kidney disease with stage 1 through stage 4 chronic kidney disease, or unspecified chronic kidney disease: Secondary | ICD-10-CM | POA: Insufficient documentation

## 2013-09-21 DIAGNOSIS — C61 Malignant neoplasm of prostate: Secondary | ICD-10-CM

## 2013-09-21 DIAGNOSIS — D509 Iron deficiency anemia, unspecified: Secondary | ICD-10-CM

## 2013-09-21 DIAGNOSIS — Z79899 Other long term (current) drug therapy: Secondary | ICD-10-CM | POA: Insufficient documentation

## 2013-09-21 DIAGNOSIS — N039 Chronic nephritic syndrome with unspecified morphologic changes: Secondary | ICD-10-CM

## 2013-09-23 ENCOUNTER — Ambulatory Visit (HOSPITAL_BASED_OUTPATIENT_CLINIC_OR_DEPARTMENT_OTHER): Payer: Medicare PPO

## 2013-09-23 ENCOUNTER — Encounter: Payer: Self-pay | Admitting: Oncology

## 2013-09-23 ENCOUNTER — Telehealth: Payer: Self-pay | Admitting: Oncology

## 2013-09-23 ENCOUNTER — Other Ambulatory Visit: Payer: Self-pay | Admitting: *Deleted

## 2013-09-23 ENCOUNTER — Other Ambulatory Visit (HOSPITAL_BASED_OUTPATIENT_CLINIC_OR_DEPARTMENT_OTHER): Payer: Medicare PPO

## 2013-09-23 ENCOUNTER — Ambulatory Visit (HOSPITAL_BASED_OUTPATIENT_CLINIC_OR_DEPARTMENT_OTHER): Payer: Medicare PPO | Admitting: Oncology

## 2013-09-23 VITALS — BP 128/52 | HR 85 | Temp 98.4°F | Resp 18 | Ht 70.0 in | Wt 240.9 lb

## 2013-09-23 VITALS — BP 136/66 | HR 72 | Temp 97.6°F | Resp 16

## 2013-09-23 DIAGNOSIS — D649 Anemia, unspecified: Secondary | ICD-10-CM

## 2013-09-23 DIAGNOSIS — C61 Malignant neoplasm of prostate: Secondary | ICD-10-CM

## 2013-09-23 DIAGNOSIS — N039 Chronic nephritic syndrome with unspecified morphologic changes: Principal | ICD-10-CM

## 2013-09-23 DIAGNOSIS — N189 Chronic kidney disease, unspecified: Secondary | ICD-10-CM | POA: Diagnosis not present

## 2013-09-23 DIAGNOSIS — Z8546 Personal history of malignant neoplasm of prostate: Secondary | ICD-10-CM

## 2013-09-23 DIAGNOSIS — D509 Iron deficiency anemia, unspecified: Secondary | ICD-10-CM

## 2013-09-23 DIAGNOSIS — N289 Disorder of kidney and ureter, unspecified: Secondary | ICD-10-CM

## 2013-09-23 DIAGNOSIS — D631 Anemia in chronic kidney disease: Secondary | ICD-10-CM | POA: Diagnosis not present

## 2013-09-23 DIAGNOSIS — D539 Nutritional anemia, unspecified: Secondary | ICD-10-CM

## 2013-09-23 DIAGNOSIS — Z79899 Other long term (current) drug therapy: Secondary | ICD-10-CM | POA: Diagnosis not present

## 2013-09-23 DIAGNOSIS — Z7982 Long term (current) use of aspirin: Secondary | ICD-10-CM | POA: Diagnosis not present

## 2013-09-23 DIAGNOSIS — I129 Hypertensive chronic kidney disease with stage 1 through stage 4 chronic kidney disease, or unspecified chronic kidney disease: Secondary | ICD-10-CM | POA: Diagnosis not present

## 2013-09-23 LAB — CBC WITH DIFFERENTIAL/PLATELET
BASO%: 0.8 % (ref 0.0–2.0)
Basophils Absolute: 0 10*3/uL (ref 0.0–0.1)
EOS ABS: 0.2 10*3/uL (ref 0.0–0.5)
EOS%: 4.5 % (ref 0.0–7.0)
HEMATOCRIT: 22.7 % — AB (ref 38.4–49.9)
HGB: 6.7 g/dL — CL (ref 13.0–17.1)
LYMPH#: 0.5 10*3/uL — AB (ref 0.9–3.3)
LYMPH%: 8.5 % — AB (ref 14.0–49.0)
MCH: 27.9 pg (ref 27.2–33.4)
MCHC: 29.3 g/dL — AB (ref 32.0–36.0)
MCV: 95.2 fL (ref 79.3–98.0)
MONO#: 0.7 10*3/uL (ref 0.1–0.9)
MONO%: 13.6 % (ref 0.0–14.0)
NEUT%: 72.6 % (ref 39.0–75.0)
NEUTROS ABS: 3.9 10*3/uL (ref 1.5–6.5)
Platelets: 317 10*3/uL (ref 140–400)
RBC: 2.39 10*6/uL — ABNORMAL LOW (ref 4.20–5.82)
RDW: 17.8 % — ABNORMAL HIGH (ref 11.0–14.6)
WBC: 5.3 10*3/uL (ref 4.0–10.3)

## 2013-09-23 LAB — PREPARE RBC (CROSSMATCH)

## 2013-09-23 LAB — HOLD TUBE, BLOOD BANK

## 2013-09-23 MED ORDER — "SYRINGE/NEEDLE (DISP) 25G X 5/8"" 1 ML MISC": 1.0000 | Freq: Every morning | Status: DC

## 2013-09-23 MED ORDER — INSULIN DETEMIR 100 UNIT/ML ~~LOC~~ SOLN: 100.0000 [IU] | SUBCUTANEOUS | Status: DC

## 2013-09-23 MED ORDER — ACETAMINOPHEN 325 MG PO TABS
ORAL_TABLET | ORAL | Status: AC
  Filled 2013-09-23: qty 2

## 2013-09-23 MED ORDER — ACETAMINOPHEN 325 MG PO TABS
650.0000 mg | ORAL_TABLET | Freq: Once | ORAL | Status: AC
  Administered 2013-09-23: 650 mg via ORAL

## 2013-09-23 MED ORDER — DIPHENHYDRAMINE HCL 25 MG PO CAPS
ORAL_CAPSULE | ORAL | Status: AC
  Filled 2013-09-23: qty 1

## 2013-09-23 MED ORDER — DIPHENHYDRAMINE HCL 25 MG PO CAPS
25.0000 mg | ORAL_CAPSULE | Freq: Once | ORAL | Status: AC
  Administered 2013-09-23: 25 mg via ORAL

## 2013-09-23 NOTE — Progress Notes (Signed)
Patient c/o feeling hungry. Has gastrostomy tube #14. Receives tube feeds at home via pump.  Contacted [facility] for osmolite and slip tip syringe for gravity feed.  Gravity feed slow though tube easily flushes with water. Received 3/4 can of osmolite when pt. stated he felt full. Suture removed at gastrostomy site per order Dr. Lona Kettle. Dressing to site replaced with sterile gauze and secured with mesh tape. Gastostomy site clean and dry.

## 2013-09-23 NOTE — Patient Instructions (Signed)

## 2013-09-23 NOTE — Telephone Encounter (Signed)
gv and printed appt sched and avs for pt for Aug and SEpt.....sed added tx. °

## 2013-09-23 NOTE — Progress Notes (Signed)
Hematology and Oncology Follow Up Visit  [name] [id number] 22-Dec-1932 80 y.o. 09/23/2013 10:48 AM [name], MDJohn, Travis Oris, MD   Principle Diagnosis: 80 year old gentleman with multifactorial anemia he has an element of anemia of chronic disease as well as iron deficiency anemia. This was diagnosed in March of 2015. He also has questionable sclerotic bone lesions that were biopsied without any specific diagnosis made. He has a remote history of prostate cancer.  Current therapy: Supportive transfusions every 2-3 weeks as needed as well Aranesp 300 mcg every 2 weeks.  Interim History:  [name] presents today for a followup visit with his daughter. Since his last visit, he continued to be stable clinically but requiring recurrent transfusions every 2 weeks. He tolerated the bone marrow biopsy without any complications. He is able to ambulate with the help of a walker but for the most part his wheelchair bound. He still attends activities at the [facility] on a regular basis. He has not had any falls or illnesses. He has not reported any complications from Aranesp or transfusions. He is not reporting any chest pain shortness of breath or difficulty breathing. He is not reporting any back pain shoulder pain or any bone pain at this time. He had not reported any hospitalizations or illnesses. Has not reported any headaches or blurry vision or double vision. Has not reported any nausea or vomiting or abdominal pain. Has not reported any frequency urgency or hesitancy. Rest or view of system is unremarkable.  Medications: I have reviewed the patient's current medications.  Current Outpatient Prescriptions  Medication Sig Dispense Refill  . allopurinol (ZYLOPRIM) 100 MG tablet Take 1 tablet (100 mg total) by mouth at bedtime.  30 tablet  1  . amiodarone (PACERONE) 200 MG tablet Take 0.5 tablets (100 mg total) by mouth every morning.  15 tablet  6  . amLODipine (NORVASC) 10 MG tablet Take 1  tablet (10 mg total) by mouth daily.  30 tablet  6  . aspirin EC 81 MG EC tablet Take 1 tablet (81 mg total) by mouth daily.      Marland Kitchen atorvastatin (LIPITOR) 40 MG tablet Take 40 mg by mouth daily.      . citalopram (CELEXA) 10 MG tablet Take 20 mg by mouth daily.       . cloNIDine (CATAPRES - DOSED IN MG/24 HR) 0.1 mg/24hr patch Place 0.1 mg onto the skin once a week. Changes patch on Thursday's      . darbepoetin (ARANESP) 300 MCG/0.6ML SOLN injection Inject 300 mcg into the skin every 14 (fourteen) days.      . Fe Fum-Vit C-Vit B12-FA (TRIGELS-F) 460-60-0.01-1 MG CAPS capsule Take 1 capsule by mouth daily.  30 capsule  6  . Fluticasone-Salmeterol (ADVAIR DISKUS) 100-50 MCG/DOSE AEPB Inhale 1 puff into the lungs 2 (two) times daily.  1 each  0  . insulin detemir (LEVEMIR) 100 UNIT/ML injection Inject 125 Units into the skin every morning.      . isosorbide mononitrate (IMDUR) 60 MG 24 hr tablet Take 1 tablet (60 mg total) by mouth daily.  90 tablet  3  . omeprazole (PRILOSEC) 20 MG capsule Take 2 capsules (40 mg total) by mouth daily.  30 capsule  6  . potassium chloride SA (K-DUR,KLOR-CON) 20 MEQ tablet Take 1 tablet (20 mEq total) by mouth daily.  30 tablet  6  . torsemide (DEMADEX) 20 MG tablet Take 40 mg by mouth daily. Takes 2 tablets each am,  and 2 tabs in afternoon every other day       No current facility-administered medications for this visit.     Allergies:  Allergies  Allergen Reactions  . Ace Inhibitors Other (See Comments)    me  hyposion  . Beta Adrenergic Blockers Other (See Comments)     use cautiously secondary to 2nd degree heart block, hypotension    Past Medical History, Surgical history, Social history, and Family History were reviewed and updated.   Physical Exam: Blood pressure 128/52, pulse 85, temperature 98.4 F (36.9 C), temperature source Oral, resp. rate 18, height 5' 10"  (1.778 m), weight 240 lb 14.4 oz (109.272 kg), SpO2 99.00%. ECOG: 2 General  appearance: alert and cooperative not in any distress. Head: Normocephalic, without obvious abnormality, atraumatic Neck: no adenopathy Lymph nodes: Cervical, supraclavicular, and axillary nodes normal. Heart:regular rate and rhythm, S1, S2 normal, no murmur, click, rub or gallop Lung:chest clear, no wheezing, rales, normal symmetric air entry Abdomin: soft, non-tender, without masses or organomegaly EXT: Trace edema noted.   Lab Results: Lab Results  Component Value Date   WBC 5.3 09/23/2013   HGB 6.7* 09/23/2013   HCT 22.7* 09/23/2013   MCV 95.2 09/23/2013   PLT 317 09/23/2013     Chemistry      Component Value Date/Time   NA 141 07/09/2013 1555   NA 142 05/06/2013 1044   K 4.2 07/09/2013 1555   K 4.0 05/06/2013 1044   CL 107 07/09/2013 1555   CO2 26 07/09/2013 1555   CO2 23 05/06/2013 1044   BUN 64* 07/09/2013 1555   BUN 50.0* 05/06/2013 1044   CREATININE 2.6* 07/09/2013 1555   CREATININE 2.4* 05/06/2013 1044   CREATININE 2.67* 04/22/2013 1801      Component Value Date/Time   CALCIUM 9.3 07/09/2013 1555   CALCIUM 8.5 05/06/2013 1044   ALKPHOS 65 07/09/2013 1555   ALKPHOS 96 05/06/2013 1044   AST 17 07/09/2013 1555   AST 20 05/06/2013 1044   ALT 14 07/09/2013 1555   ALT 27 05/06/2013 1044   BILITOT 0.2 07/09/2013 1555   BILITOT 0.23 05/06/2013 1044       Impression and Plan:  80 year old gentleman with the following issues:   1. Multifactorial anemia: He has an element of anemia of renal disease as well as iron deficiency anemia. He continues to be transfusion dependent despite Aransep, iron supplement and other interventions. His bone marrow biopsy completed on 09/09/2013 and was discussed with Dr. Gari Kim reviewing pathologists. The biopsy showed hypercellular bone marrow with erythroid proliferation associated with a mild dyserythropoiesis suggesting myelodysplastic syndrome. He did have a small focus of metastatic carcinoma likely from a prostate cancer involvement but really no diffuse  infiltration of the bone marrow. It appears that his anemia is likely related to myelodysplasia without any evidence of acute leukemia or a massive infiltration of a solid malignancy. For the time being, we'll continue supportive measures with back red cell transfusions every 2 weeks.  2. History of prostate cancer: He is status post prostatectomy close to 15 years ago.Now he has questionable sclerotic lesions with a PSA of around 1.89.  I plan on restaging him with a bone scan and a repeat PSA. If his PSA is rising with measurable sclerotic bony metastasis I will initiate androgen deprivation as well. Risks and benefits of that was discussed and he is open to any maneuver that will improve his quality of life and extend his overall survival.  3. Followup: He  will continue to follow every 2 weeks with PRBCs transfusions and clinical visit in 6 weeks.    Joyce [facility], MD 8/5/201510:48 AM

## 2013-09-23 NOTE — Telephone Encounter (Signed)
gv and rpinted appt sched and avs for pt for Aug and SEpt...sed added tx. °

## 2013-09-24 LAB — TYPE AND SCREEN
ABO/RH(D): O POS
ANTIBODY SCREEN: NEGATIVE
UNIT DIVISION: 0
Unit division: 0
Unit division: 0
Unit division: 0

## 2013-10-01 ENCOUNTER — Encounter: Payer: Self-pay | Admitting: Oncology

## 2013-10-02 ENCOUNTER — Telehealth: Payer: Self-pay

## 2013-10-02 MED ORDER — CLONIDINE HCL 0.1 MG/24HR TD PTWK: 0.1000 mg | MEDICATED_PATCH | TRANSDERMAL | Status: DC

## 2013-10-02 NOTE — Telephone Encounter (Signed)
rx sent in 

## 2013-10-07 ENCOUNTER — Telehealth: Payer: Self-pay | Admitting: Internal Medicine

## 2013-10-07 ENCOUNTER — Other Ambulatory Visit (HOSPITAL_BASED_OUTPATIENT_CLINIC_OR_DEPARTMENT_OTHER): Payer: Medicare PPO

## 2013-10-07 ENCOUNTER — Ambulatory Visit (HOSPITAL_BASED_OUTPATIENT_CLINIC_OR_DEPARTMENT_OTHER): Payer: Medicare PPO

## 2013-10-07 VITALS — BP 154/63 | HR 74 | Temp 97.6°F | Resp 16

## 2013-10-07 DIAGNOSIS — S81801D Unspecified open wound, right lower leg, subsequent encounter: Secondary | ICD-10-CM

## 2013-10-07 DIAGNOSIS — D509 Iron deficiency anemia, unspecified: Secondary | ICD-10-CM

## 2013-10-07 DIAGNOSIS — D649 Anemia, unspecified: Secondary | ICD-10-CM

## 2013-10-07 DIAGNOSIS — C61 Malignant neoplasm of prostate: Secondary | ICD-10-CM

## 2013-10-07 DIAGNOSIS — D631 Anemia in chronic kidney disease: Secondary | ICD-10-CM

## 2013-10-07 DIAGNOSIS — N289 Disorder of kidney and ureter, unspecified: Secondary | ICD-10-CM

## 2013-10-07 DIAGNOSIS — N039 Chronic nephritic syndrome with unspecified morphologic changes: Secondary | ICD-10-CM

## 2013-10-07 DIAGNOSIS — N183 Chronic kidney disease, stage 3 unspecified: Secondary | ICD-10-CM

## 2013-10-07 LAB — CBC WITH DIFFERENTIAL/PLATELET
BASO%: 0.6 % (ref 0.0–2.0)
Basophils Absolute: 0.1 10*3/uL (ref 0.0–0.1)
EOS%: 3.4 % (ref 0.0–7.0)
Eosinophils Absolute: 0.4 10*3/uL (ref 0.0–0.5)
HCT: 17.5 % — ABNORMAL LOW (ref 38.4–49.9)
HGB: 5.1 g/dL — CL (ref 13.0–17.1)
LYMPH#: 0.8 10*3/uL — AB (ref 0.9–3.3)
LYMPH%: 7.3 % — ABNORMAL LOW (ref 14.0–49.0)
MCH: 27.3 pg (ref 27.2–33.4)
MCHC: 28.9 g/dL — AB (ref 32.0–36.0)
MCV: 94.4 fL (ref 79.3–98.0)
MONO#: 1 10*3/uL — AB (ref 0.1–0.9)
MONO%: 9.1 % (ref 0.0–14.0)
NEUT%: 79.6 % — ABNORMAL HIGH (ref 39.0–75.0)
NEUTROS ABS: 8.4 10*3/uL — AB (ref 1.5–6.5)
Platelets: 448 10*3/uL — ABNORMAL HIGH (ref 140–400)
RBC: 1.85 10*6/uL — AB (ref 4.20–5.82)
RDW: 17.5 % — ABNORMAL HIGH (ref 11.0–14.6)
WBC: 10.6 10*3/uL — AB (ref 4.0–10.3)

## 2013-10-07 LAB — PREPARE RBC (CROSSMATCH)

## 2013-10-07 LAB — HOLD TUBE, BLOOD BANK

## 2013-10-07 MED ORDER — ACETAMINOPHEN 325 MG PO TABS
650.0000 mg | ORAL_TABLET | Freq: Once | ORAL | Status: AC
  Administered 2013-10-07: 650 mg via ORAL

## 2013-10-07 MED ORDER — DIPHENHYDRAMINE HCL 25 MG PO CAPS
25.0000 mg | ORAL_CAPSULE | Freq: Once | ORAL | Status: AC
  Administered 2013-10-07: 25 mg via ORAL

## 2013-10-07 MED ORDER — SODIUM CHLORIDE 0.9 % IV SOLN
250.0000 mL | Freq: Once | INTRAVENOUS | Status: DC
  Filled 2013-10-07: qty 250

## 2013-10-07 MED ORDER — SODIUM CHLORIDE 0.9 % IV SOLN
250.0000 mL | Freq: Once | INTRAVENOUS | Status: AC
  Administered 2013-10-07: 250 mL via INTRAVENOUS

## 2013-10-07 MED ORDER — DARBEPOETIN ALFA-POLYSORBATE 300 MCG/0.6ML IJ SOLN
300.0000 ug | Freq: Once | INTRAMUSCULAR | Status: AC
  Administered 2013-10-07: 300 ug via SUBCUTANEOUS
  Filled 2013-10-07: qty 0.6

## 2013-10-07 MED ORDER — DIPHENHYDRAMINE HCL 25 MG PO CAPS
ORAL_CAPSULE | ORAL | Status: AC
  Filled 2013-10-07: qty 1

## 2013-10-07 MED ORDER — ACETAMINOPHEN 325 MG PO TABS
ORAL_TABLET | ORAL | Status: AC
  Filled 2013-10-07: qty 2

## 2013-10-07 NOTE — Telephone Encounter (Signed)
Last office note and demographics faxed to [employer].

## 2013-10-07 NOTE — Telephone Encounter (Signed)
Patient's daughter is calling to inquire about a referral for the patient to be seen at the wound clinic for the ulcers on his leg. She says that they have still not gotten any better. Please advise.

## 2013-10-07 NOTE — Patient Instructions (Signed)

## 2013-10-07 NOTE — Telephone Encounter (Signed)
Referral done

## 2013-10-07 NOTE — Telephone Encounter (Signed)
[employer] is requesting chart notes and patient demographic information phone # [phone]  Fax # [phone] This is for the power chair

## 2013-10-08 LAB — TYPE AND SCREEN
ABO/RH(D): O POS
Antibody Screen: NEGATIVE
UNIT DIVISION: 0
Unit division: 0

## 2013-10-20 ENCOUNTER — Ambulatory Visit (HOSPITAL_COMMUNITY)
Admission: RE | Admit: 2013-10-20 | Discharge: 2013-10-20 | Disposition: A | Discharge status: Home / Self Care | Payer: Medicare PPO | Source: Ambulatory Visit | Attending: Oncology | Admitting: Oncology

## 2013-10-20 DIAGNOSIS — E119 Type 2 diabetes mellitus without complications: Secondary | ICD-10-CM

## 2013-10-20 DIAGNOSIS — I129 Hypertensive chronic kidney disease with stage 1 through stage 4 chronic kidney disease, or unspecified chronic kidney disease: Secondary | ICD-10-CM | POA: Insufficient documentation

## 2013-10-20 DIAGNOSIS — I5032 Chronic diastolic (congestive) heart failure: Secondary | ICD-10-CM

## 2013-10-20 DIAGNOSIS — N183 Chronic kidney disease, stage 3 unspecified: Secondary | ICD-10-CM | POA: Insufficient documentation

## 2013-10-20 DIAGNOSIS — C7951 Secondary malignant neoplasm of bone: Secondary | ICD-10-CM

## 2013-10-20 DIAGNOSIS — D6481 Anemia due to antineoplastic chemotherapy: Secondary | ICD-10-CM | POA: Insufficient documentation

## 2013-10-20 DIAGNOSIS — Z8546 Personal history of malignant neoplasm of prostate: Secondary | ICD-10-CM

## 2013-10-20 DIAGNOSIS — C7952 Secondary malignant neoplasm of bone marrow: Secondary | ICD-10-CM

## 2013-10-20 DIAGNOSIS — T451X5A Adverse effect of antineoplastic and immunosuppressive drugs, initial encounter: Principal | ICD-10-CM

## 2013-10-21 ENCOUNTER — Inpatient Hospital Stay (HOSPITAL_COMMUNITY)
Admission: EM | Admit: 2013-10-21 | Discharge: 2013-10-26 | DRG: 291 | Disposition: A | Discharge status: Home Health | Payer: Medicare PPO | Attending: Internal Medicine | Admitting: Internal Medicine

## 2013-10-21 ENCOUNTER — Other Ambulatory Visit (HOSPITAL_BASED_OUTPATIENT_CLINIC_OR_DEPARTMENT_OTHER): Payer: Medicare PPO

## 2013-10-21 ENCOUNTER — Other Ambulatory Visit: Payer: Self-pay | Admitting: *Deleted

## 2013-10-21 ENCOUNTER — Encounter (HOSPITAL_COMMUNITY): Payer: Self-pay | Admitting: Emergency Medicine

## 2013-10-21 ENCOUNTER — Emergency Department (HOSPITAL_COMMUNITY): Payer: Medicare PPO

## 2013-10-21 ENCOUNTER — Ambulatory Visit (HOSPITAL_BASED_OUTPATIENT_CLINIC_OR_DEPARTMENT_OTHER): Payer: Medicare PPO | Admitting: Nurse Practitioner

## 2013-10-21 ENCOUNTER — Inpatient Hospital Stay (HOSPITAL_COMMUNITY): Payer: Medicare PPO

## 2013-10-21 ENCOUNTER — Ambulatory Visit (HOSPITAL_BASED_OUTPATIENT_CLINIC_OR_DEPARTMENT_OTHER): Payer: Medicare PPO

## 2013-10-21 VITALS — BP 147/70 | HR 88 | Temp 97.5°F | Resp 24

## 2013-10-21 DIAGNOSIS — T8092XA Unspecified transfusion reaction, initial encounter: Secondary | ICD-10-CM

## 2013-10-21 DIAGNOSIS — D638 Anemia in other chronic diseases classified elsewhere: Secondary | ICD-10-CM

## 2013-10-21 DIAGNOSIS — K5900 Constipation, unspecified: Secondary | ICD-10-CM | POA: Diagnosis present

## 2013-10-21 DIAGNOSIS — R0602 Shortness of breath: Secondary | ICD-10-CM

## 2013-10-21 DIAGNOSIS — I4891 Unspecified atrial fibrillation: Secondary | ICD-10-CM | POA: Diagnosis present

## 2013-10-21 DIAGNOSIS — I5033 Acute on chronic diastolic (congestive) heart failure: Secondary | ICD-10-CM | POA: Diagnosis present

## 2013-10-21 DIAGNOSIS — Z87891 Personal history of nicotine dependence: Secondary | ICD-10-CM | POA: Diagnosis not present

## 2013-10-21 DIAGNOSIS — I5032 Chronic diastolic (congestive) heart failure: Secondary | ICD-10-CM | POA: Diagnosis present

## 2013-10-21 DIAGNOSIS — Z8546 Personal history of malignant neoplasm of prostate: Secondary | ICD-10-CM

## 2013-10-21 DIAGNOSIS — T451X5A Adverse effect of antineoplastic and immunosuppressive drugs, initial encounter: Secondary | ICD-10-CM

## 2013-10-21 DIAGNOSIS — E876 Hypokalemia: Secondary | ICD-10-CM | POA: Diagnosis present

## 2013-10-21 DIAGNOSIS — R9389 Abnormal findings on diagnostic imaging of other specified body structures: Secondary | ICD-10-CM

## 2013-10-21 DIAGNOSIS — Z8673 Personal history of transient ischemic attack (TIA), and cerebral infarction without residual deficits: Secondary | ICD-10-CM | POA: Diagnosis not present

## 2013-10-21 DIAGNOSIS — G4733 Obstructive sleep apnea (adult) (pediatric): Secondary | ICD-10-CM | POA: Diagnosis present

## 2013-10-21 DIAGNOSIS — M1990 Unspecified osteoarthritis, unspecified site: Secondary | ICD-10-CM

## 2013-10-21 DIAGNOSIS — D6481 Anemia due to antineoplastic chemotherapy: Secondary | ICD-10-CM

## 2013-10-21 DIAGNOSIS — J438 Other emphysema: Secondary | ICD-10-CM | POA: Diagnosis present

## 2013-10-21 DIAGNOSIS — J9610 Chronic respiratory failure, unspecified whether with hypoxia or hypercapnia: Secondary | ICD-10-CM | POA: Diagnosis present

## 2013-10-21 DIAGNOSIS — N058 Unspecified nephritic syndrome with other morphologic changes: Secondary | ICD-10-CM | POA: Diagnosis present

## 2013-10-21 DIAGNOSIS — I498 Other specified cardiac arrhythmias: Secondary | ICD-10-CM | POA: Diagnosis not present

## 2013-10-21 DIAGNOSIS — N039 Chronic nephritic syndrome with unspecified morphologic changes: Secondary | ICD-10-CM

## 2013-10-21 DIAGNOSIS — D469 Myelodysplastic syndrome, unspecified: Secondary | ICD-10-CM | POA: Diagnosis present

## 2013-10-21 DIAGNOSIS — I4729 Other ventricular tachycardia: Secondary | ICD-10-CM | POA: Diagnosis present

## 2013-10-21 DIAGNOSIS — C7951 Secondary malignant neoplasm of bone: Secondary | ICD-10-CM

## 2013-10-21 DIAGNOSIS — J9621 Acute and chronic respiratory failure with hypoxia: Secondary | ICD-10-CM

## 2013-10-21 DIAGNOSIS — L97909 Non-pressure chronic ulcer of unspecified part of unspecified lower leg with unspecified severity: Secondary | ICD-10-CM | POA: Diagnosis present

## 2013-10-21 DIAGNOSIS — Z66 Do not resuscitate: Secondary | ICD-10-CM | POA: Diagnosis present

## 2013-10-21 DIAGNOSIS — N183 Chronic kidney disease, stage 3 unspecified: Secondary | ICD-10-CM | POA: Diagnosis present

## 2013-10-21 DIAGNOSIS — K279 Peptic ulcer, site unspecified, unspecified as acute or chronic, without hemorrhage or perforation: Secondary | ICD-10-CM

## 2013-10-21 DIAGNOSIS — IMO0001 Reserved for inherently not codable concepts without codable children: Secondary | ICD-10-CM

## 2013-10-21 DIAGNOSIS — J471 Bronchiectasis with (acute) exacerbation: Secondary | ICD-10-CM | POA: Diagnosis present

## 2013-10-21 DIAGNOSIS — J189 Pneumonia, unspecified organism: Secondary | ICD-10-CM

## 2013-10-21 DIAGNOSIS — R0603 Acute respiratory distress: Secondary | ICD-10-CM

## 2013-10-21 DIAGNOSIS — J9620 Acute and chronic respiratory failure, unspecified whether with hypoxia or hypercapnia: Secondary | ICD-10-CM | POA: Diagnosis present

## 2013-10-21 DIAGNOSIS — I83023 Varicose veins of left lower extremity with ulcer of ankle: Secondary | ICD-10-CM

## 2013-10-21 DIAGNOSIS — Z860100 Personal history of colon polyps, unspecified: Secondary | ICD-10-CM

## 2013-10-21 DIAGNOSIS — F411 Generalized anxiety disorder: Secondary | ICD-10-CM

## 2013-10-21 DIAGNOSIS — I441 Atrioventricular block, second degree: Secondary | ICD-10-CM

## 2013-10-21 DIAGNOSIS — E785 Hyperlipidemia, unspecified: Secondary | ICD-10-CM

## 2013-10-21 DIAGNOSIS — E0590 Thyrotoxicosis, unspecified without thyrotoxic crisis or storm: Secondary | ICD-10-CM

## 2013-10-21 DIAGNOSIS — D649 Anemia, unspecified: Secondary | ICD-10-CM | POA: Diagnosis present

## 2013-10-21 DIAGNOSIS — E1129 Type 2 diabetes mellitus with other diabetic kidney complication: Secondary | ICD-10-CM | POA: Diagnosis present

## 2013-10-21 DIAGNOSIS — Z0000 Encounter for general adult medical examination without abnormal findings: Secondary | ICD-10-CM

## 2013-10-21 DIAGNOSIS — R9431 Abnormal electrocardiogram [ECG] [EKG]: Secondary | ICD-10-CM

## 2013-10-21 DIAGNOSIS — N400 Enlarged prostate without lower urinary tract symptoms: Secondary | ICD-10-CM

## 2013-10-21 DIAGNOSIS — R059 Cough, unspecified: Secondary | ICD-10-CM

## 2013-10-21 DIAGNOSIS — E875 Hyperkalemia: Secondary | ICD-10-CM

## 2013-10-21 DIAGNOSIS — Z794 Long term (current) use of insulin: Secondary | ICD-10-CM | POA: Diagnosis not present

## 2013-10-21 DIAGNOSIS — D539 Nutritional anemia, unspecified: Secondary | ICD-10-CM

## 2013-10-21 DIAGNOSIS — I2510 Atherosclerotic heart disease of native coronary artery without angina pectoris: Secondary | ICD-10-CM | POA: Diagnosis present

## 2013-10-21 DIAGNOSIS — I480 Paroxysmal atrial fibrillation: Secondary | ICD-10-CM | POA: Diagnosis present

## 2013-10-21 DIAGNOSIS — R040 Epistaxis: Secondary | ICD-10-CM

## 2013-10-21 DIAGNOSIS — Z9981 Dependence on supplemental oxygen: Secondary | ICD-10-CM | POA: Diagnosis not present

## 2013-10-21 DIAGNOSIS — I129 Hypertensive chronic kidney disease with stage 1 through stage 4 chronic kidney disease, or unspecified chronic kidney disease: Secondary | ICD-10-CM | POA: Diagnosis present

## 2013-10-21 DIAGNOSIS — R05 Cough: Secondary | ICD-10-CM

## 2013-10-21 DIAGNOSIS — R5381 Other malaise: Secondary | ICD-10-CM | POA: Diagnosis present

## 2013-10-21 DIAGNOSIS — G609 Hereditary and idiopathic neuropathy, unspecified: Secondary | ICD-10-CM

## 2013-10-21 DIAGNOSIS — I872 Venous insufficiency (chronic) (peripheral): Secondary | ICD-10-CM | POA: Diagnosis present

## 2013-10-21 DIAGNOSIS — R590 Localized enlarged lymph nodes: Secondary | ICD-10-CM

## 2013-10-21 DIAGNOSIS — Z7982 Long term (current) use of aspirin: Secondary | ICD-10-CM

## 2013-10-21 DIAGNOSIS — Z993 Dependence on wheelchair: Secondary | ICD-10-CM

## 2013-10-21 DIAGNOSIS — H9193 Unspecified hearing loss, bilateral: Secondary | ICD-10-CM

## 2013-10-21 DIAGNOSIS — Z9079 Acquired absence of other genital organ(s): Secondary | ICD-10-CM | POA: Diagnosis not present

## 2013-10-21 DIAGNOSIS — D6189 Other specified aplastic anemias and other bone marrow failure syndromes: Secondary | ICD-10-CM

## 2013-10-21 DIAGNOSIS — G4700 Insomnia, unspecified: Secondary | ICD-10-CM

## 2013-10-21 DIAGNOSIS — I472 Ventricular tachycardia, unspecified: Secondary | ICD-10-CM | POA: Diagnosis present

## 2013-10-21 DIAGNOSIS — L03116 Cellulitis of left lower limb: Secondary | ICD-10-CM

## 2013-10-21 DIAGNOSIS — I509 Heart failure, unspecified: Secondary | ICD-10-CM

## 2013-10-21 DIAGNOSIS — R609 Edema, unspecified: Secondary | ICD-10-CM

## 2013-10-21 DIAGNOSIS — D631 Anemia in chronic kidney disease: Secondary | ICD-10-CM

## 2013-10-21 DIAGNOSIS — Z79899 Other long term (current) drug therapy: Secondary | ICD-10-CM | POA: Diagnosis not present

## 2013-10-21 DIAGNOSIS — Z8249 Family history of ischemic heart disease and other diseases of the circulatory system: Secondary | ICD-10-CM

## 2013-10-21 DIAGNOSIS — J309 Allergic rhinitis, unspecified: Secondary | ICD-10-CM

## 2013-10-21 DIAGNOSIS — Z7409 Other reduced mobility: Secondary | ICD-10-CM

## 2013-10-21 DIAGNOSIS — Z8679 Personal history of other diseases of the circulatory system: Secondary | ICD-10-CM

## 2013-10-21 DIAGNOSIS — C7952 Secondary malignant neoplasm of bone marrow: Secondary | ICD-10-CM

## 2013-10-21 DIAGNOSIS — I10 Essential (primary) hypertension: Secondary | ICD-10-CM | POA: Diagnosis present

## 2013-10-21 DIAGNOSIS — R937 Abnormal findings on diagnostic imaging of other parts of musculoskeletal system: Secondary | ICD-10-CM

## 2013-10-21 DIAGNOSIS — Z23 Encounter for immunization: Secondary | ICD-10-CM

## 2013-10-21 DIAGNOSIS — C61 Malignant neoplasm of prostate: Secondary | ICD-10-CM

## 2013-10-21 DIAGNOSIS — M5412 Radiculopathy, cervical region: Secondary | ICD-10-CM

## 2013-10-21 DIAGNOSIS — L97329 Non-pressure chronic ulcer of left ankle with unspecified severity: Secondary | ICD-10-CM

## 2013-10-21 DIAGNOSIS — E1165 Type 2 diabetes mellitus with hyperglycemia: Secondary | ICD-10-CM | POA: Diagnosis present

## 2013-10-21 DIAGNOSIS — J8410 Pulmonary fibrosis, unspecified: Secondary | ICD-10-CM | POA: Diagnosis present

## 2013-10-21 DIAGNOSIS — G4734 Idiopathic sleep related nonobstructive alveolar hypoventilation: Secondary | ICD-10-CM

## 2013-10-21 DIAGNOSIS — F1021 Alcohol dependence, in remission: Secondary | ICD-10-CM

## 2013-10-21 DIAGNOSIS — M109 Gout, unspecified: Secondary | ICD-10-CM

## 2013-10-21 DIAGNOSIS — R001 Bradycardia, unspecified: Secondary | ICD-10-CM

## 2013-10-21 LAB — COMPREHENSIVE METABOLIC PANEL
ALBUMIN: 2.6 g/dL — AB (ref 3.5–5.2)
ALT: 15 U/L (ref 0–53)
AST: 21 U/L (ref 0–37)
Alkaline Phosphatase: 77 U/L (ref 39–117)
Anion gap: 17 — ABNORMAL HIGH (ref 5–15)
BUN: 45 mg/dL — ABNORMAL HIGH (ref 6–23)
CALCIUM: 8.9 mg/dL (ref 8.4–10.5)
CO2: 20 mEq/L (ref 19–32)
CREATININE: 2.08 mg/dL — AB (ref 0.50–1.35)
Chloride: 104 mEq/L (ref 96–112)
GFR calc Af Amer: 33 mL/min — ABNORMAL LOW (ref >90)
GFR calc non Af Amer: 29 mL/min — ABNORMAL LOW (ref >90)
Glucose, Bld: 173 mg/dL — ABNORMAL HIGH (ref 70–99)
Potassium: 4.2 mEq/L (ref 3.7–5.3)
Sodium: 141 mEq/L (ref 137–147)
TOTAL PROTEIN: 7.2 g/dL (ref 6.0–8.3)
Total Bilirubin: 0.3 mg/dL (ref 0.3–1.2)

## 2013-10-21 LAB — CBC WITH DIFFERENTIAL/PLATELET
BASO%: 1.3 % (ref 0.0–2.0)
BASOS PCT: 0 % (ref 0–1)
Basophils Absolute: 0.1 10*3/uL (ref 0.0–0.1)
Basophils Absolute: 0 10*3/uL (ref 0.0–0.1)
EOS ABS: 0.5 10*3/uL (ref 0.0–0.7)
EOS PCT: 9 % — AB (ref 0–5)
EOS%: 9.4 % — AB (ref 0.0–7.0)
Eosinophils Absolute: 0.5 10*3/uL (ref 0.0–0.5)
HCT: 25.4 % — ABNORMAL LOW (ref 38.4–49.9)
HEMATOCRIT: 36.4 % — AB (ref 39.0–52.0)
HEMOGLOBIN: 10.7 g/dL — AB (ref 13.0–17.0)
HGB: 7.5 g/dL — ABNORMAL LOW (ref 13.0–17.1)
LYMPH%: 7.3 % — ABNORMAL LOW (ref 14.0–49.0)
Lymphocytes Relative: 10 % — ABNORMAL LOW (ref 12–46)
Lymphs Abs: 0.5 10*3/uL — ABNORMAL LOW (ref 0.7–4.0)
MCH: 26 pg — AB (ref 27.2–33.4)
MCH: 26.3 pg (ref 26.0–34.0)
MCHC: 29.3 g/dL — ABNORMAL LOW (ref 32.0–36.0)
MCHC: 29.4 g/dL — AB (ref 30.0–36.0)
MCV: 88.6 fL (ref 79.3–98.0)
MCV: 89.4 fL (ref 78.0–100.0)
MONO ABS: 0.2 10*3/uL (ref 0.1–1.0)
MONO#: 0.6 10*3/uL (ref 0.1–0.9)
MONO%: 11.3 % (ref 0.0–14.0)
MONOS PCT: 5 % (ref 3–12)
NEUT#: 3.7 10*3/uL (ref 1.5–6.5)
NEUT%: 70.7 % (ref 39.0–75.0)
NEUTROS PCT: 76 % (ref 43–77)
Neutro Abs: 3.9 10*3/uL (ref 1.7–7.7)
Platelets: 402 10*3/uL — ABNORMAL HIGH (ref 140–400)
Platelets: 356 10*3/uL (ref 150–400)
RBC: 2.87 10*6/uL — ABNORMAL LOW (ref 4.20–5.82)
RBC: 4.07 MIL/uL — ABNORMAL LOW (ref 4.22–5.81)
RDW: 16.6 % — ABNORMAL HIGH (ref 11.0–14.6)
RDW: 16.1 % — ABNORMAL HIGH (ref 11.5–15.5)
WBC: 5.2 10*3/uL (ref 4.0–10.3)
WBC: 5.2 10*3/uL (ref 4.0–10.5)
lymph#: 0.4 10*3/uL — ABNORMAL LOW (ref 0.9–3.3)

## 2013-10-21 LAB — COMPREHENSIVE METABOLIC PANEL (CC13)
ALT: 17 U/L (ref 0–55)
AST: 16 U/L (ref 5–34)
Albumin: 2.3 g/dL — ABNORMAL LOW (ref 3.5–5.0)
Alkaline Phosphatase: 73 U/L (ref 40–150)
Anion Gap: 8 mEq/L (ref 3–11)
BUN: 45.8 mg/dL — ABNORMAL HIGH (ref 7.0–26.0)
CO2: 22 mEq/L (ref 22–29)
Calcium: 8.7 mg/dL (ref 8.4–10.4)
Chloride: 111 mEq/L — ABNORMAL HIGH (ref 98–109)
Creatinine: 2.3 mg/dL — ABNORMAL HIGH (ref 0.7–1.3)
Glucose: 101 mg/dl (ref 70–140)
Potassium: 4.1 mEq/L (ref 3.5–5.1)
Sodium: 142 mEq/L (ref 136–145)
Total Protein: 6.5 g/dL (ref 6.4–8.3)

## 2013-10-21 LAB — I-STAT CG4 LACTIC ACID, ED: Lactic Acid, Venous: 0.95 mmol/L (ref 0.5–2.2)

## 2013-10-21 LAB — TROPONIN I

## 2013-10-21 LAB — PRO B NATRIURETIC PEPTIDE: Pro B Natriuretic peptide (BNP): 1541 pg/mL — ABNORMAL HIGH (ref 0–450)

## 2013-10-21 LAB — HOLD TUBE, BLOOD BANK

## 2013-10-21 MED ORDER — DIPHENHYDRAMINE HCL 25 MG PO CAPS
ORAL_CAPSULE | ORAL | Status: AC
  Filled 2013-10-21: qty 1

## 2013-10-21 MED ORDER — ACETAMINOPHEN 325 MG PO TABS
650.0000 mg | ORAL_TABLET | Freq: Once | ORAL | Status: AC
  Administered 2013-10-21: 650 mg via ORAL

## 2013-10-21 MED ORDER — ALBUTEROL SULFATE (2.5 MG/3ML) 0.083% IN NEBU
5.0000 mg | INHALATION_SOLUTION | Freq: Once | RESPIRATORY_TRACT | Status: AC
  Administered 2013-10-21: 5 mg via RESPIRATORY_TRACT
  Filled 2013-10-21: qty 6

## 2013-10-21 MED ORDER — INFLUENZA VAC SPLIT QUAD 0.5 ML IM SUSY
0.5000 mL | PREFILLED_SYRINGE | Freq: Once | INTRAMUSCULAR | Status: AC
  Administered 2013-10-21: 0.5 mL via INTRAMUSCULAR
  Filled 2013-10-21: qty 0.5

## 2013-10-21 MED ORDER — SODIUM CHLORIDE 0.9 % IV SOLN
250.0000 mL | Freq: Once | INTRAVENOUS | Status: AC
  Administered 2013-10-21: 250 mL via INTRAVENOUS

## 2013-10-21 MED ORDER — DIPHENHYDRAMINE HCL 25 MG PO CAPS
25.0000 mg | ORAL_CAPSULE | Freq: Once | ORAL | Status: AC
  Administered 2013-10-21: 25 mg via ORAL

## 2013-10-21 MED ORDER — FUROSEMIDE 10 MG/ML IJ SOLN
40.0000 mg | Freq: Once | INTRAMUSCULAR | Status: AC
  Administered 2013-10-21: 40 mg via INTRAVENOUS
  Filled 2013-10-21: qty 4

## 2013-10-21 MED ORDER — SODIUM CHLORIDE 0.9 % IJ SOLN
3.0000 mL | INTRAMUSCULAR | Status: AC | PRN
  Administered 2013-10-21: 3 mL
  Filled 2013-10-21: qty 10

## 2013-10-21 MED ORDER — ACETAMINOPHEN 325 MG PO TABS
ORAL_TABLET | ORAL | Status: AC
  Filled 2013-10-21: qty 2
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
## 2013-10-21 NOTE — ED Notes (Signed)
[name] [phone] Daughter, POA

## 2013-10-21 NOTE — Patient Instructions (Signed)

## 2013-10-21 NOTE — ED Notes (Signed)
Pt reports SOB after 2 blood transfusions at the cancer center today.  Pt reports that he could not breathe, and was short of breath with wheezing.  Pt currently presents in no acute distress with lung apexes clear, left base clear, and crackles in right base.  Pt states "i feel like i am back to normal."

## 2013-10-21 NOTE — ED Notes (Signed)
Patient transported to X-ray 

## 2013-10-21 NOTE — ED Notes (Signed)
Bed: NL97 Expected date:  Expected time:  Means of arrival:  Comments: [facility]- SOB

## 2013-10-21 NOTE — Progress Notes (Signed)
This RN notes audible wheezing while patient receiving RBC's. He reports recent "cold" with congestion and cough. Pt has inhaler listed on med list. Selena Lesser, NP called to assess patient. She recommends chest xray to r/o possible pneumonia. RBC discontinued with approx 50 cc left to infuse. VSS, PIV saline locked. ED charge RN aware.   1740 at time of transfer to ED, patient stood up to void then pivoted to wheelchair, at that time he became SOB, gasping and groaning asking for O2. Sat increasing with pulse noted 104 but quickly returned and stable at 88. O2 [place] @4L  for transport to ED. Selena Lesser, NP notified. Delsa Sale Charge RN expecting patient in ED. Pt transported with 2 RN's.

## 2013-10-21 NOTE — H&P (Signed)
PCP:  [name], MD  Oncology: [name]  Chief Complaint:  shortness of breath  HPI: [name] is a 80 y.o. male   has a past medical history of Chronic diastolic heart failure; Arrhythmia; Stroke; Other and unspecified hyperlipidemia; Hypertension; Hypertrophy of prostate without urinary obstruction and other lower urinary tract symptoms (LUTS); Osteoarthrosis, unspecified whether generalized or localized, unspecified site; Type II or unspecified type diabetes mellitus without mention of complication, not stated as uncontrolled; AV block, Mobitz 1; Obstructive sleep apnea; Iron deficiency anemia, unspecified; Cerebrovascular accident; Renal insufficiency; Obesity; Allergic rhinitis, cause unspecified (05/29/2010); CAD (coronary artery disease); Normochromic normocytic anemia (04/21/2013); Abnormal CXR (04/21/2013); Hypertensive kidney disease with CKD stage III (04/21/2013); and Prostate cancer.   Presented with  Patient has hx of anemia due to combination of anemia of chronic disease, myelodysplastic disorder and iron deficiency anemia on Aranesp requiring recurrent transfusions. Today he was transfused at cancer cancer and he became short of breath and feeling congested. Denies any chest pain. He has hx of diastolic CHF. Patient is on 3 L of Oxygen at home at night but not during the day. Currently he is on 3L sating 99%. No fever or chills.  Per patient he has been progressively short of breath for months but has decompensated in the past 2 weeks she worsening cough productive of large amount of sputum. After the transfusion he became acutely short of breath and that's what triggered transfer to the emergency department. Family endorses intermittent leg swelling with leg ulcers that has been going on for months as well they're planning to have patient be evaluated by wound care.  Family reports possible blood in his depends times one but they were not sure. He has had black stools but his  daughter have been given him iron tablets.   Patient had had CT scan done in March 2015 worrisome for Chronic lung disease with emphysema, fibrosis and bronchiectasis suspected. Patient has history of smoking.  Of note patient has history of prostatic cancer with some suspicion of metastatic spread to the bone lytic lesions.  Hospitalist was called for admission for fluid overload.   Review of Systems:    Pertinent positives include: shortness of breath  Constitutional:  No weight loss, night sweats, Fevers, chills, fatigue, weight loss  HEENT:  No headaches, Difficulty swallowing,Tooth/dental problems,Sore throat,  No sneezing, itching, ear ache, nasal congestion, post nasal drip,  Cardio-vascular:  No chest pain, Orthopnea, PND, anasarca, dizziness, palpitations.no Bilateral lower extremity swelling  GI:  No heartburn, indigestion, abdominal pain, nausea, vomiting, diarrhea, change in bowel habits, loss of appetite, melena, blood in stool, hematemesis Resp:  no shortness of breath at rest. No dyspnea on exertion, No excess mucus, no productive cough, No non-productive cough, No coughing up of blood.No change in color of mucus.No wheezing. Skin:  no rash or lesions. No jaundice GU:  no dysuria, change in color of urine, no urgency or frequency. No straining to urinate.  No flank pain.  Musculoskeletal:  No joint pain or no joint swelling. No decreased range of motion. No back pain.  Psych:  No change in mood or affect. No depression or anxiety. No memory loss.  Neuro: no localizing neurological complaints, no tingling, no weakness, no double vision, no gait abnormality, no slurred speech, no confusion  Otherwise ROS are negative except for above, 10 systems were reviewed  Past Medical History: Past Medical History  Diagnosis Date  . Chronic diastolic heart failure     secondary  to diastolic dysfunction EF (previous EF 35-45%)ef 60%4/09  . Arrhythmia     atrial fibrillation  /pt on amiodarone,thought to be poor coumadin  . Stroke   . Other and unspecified hyperlipidemia   . Hypertension   . Hypertrophy of prostate without urinary obstruction and other lower urinary tract symptoms (LUTS)   . Osteoarthrosis, unspecified whether generalized or localized, unspecified site   . Type II or unspecified type diabetes mellitus without mention of complication, not stated as uncontrolled   . AV block, Mobitz 1     Intolerance to ACE's / discontiuation of beta blockers  . Obstructive sleep apnea   . Iron deficiency anemia, unspecified     s/p EGD and coloscopy 3/10.gastritis.hemorrhids  . Cerebrovascular accident   . Renal insufficiency   . Obesity   . Allergic rhinitis, cause unspecified 05/29/2010  . CAD (coronary artery disease)     cath 12/04: mLAD 50-60%, mCFX 20-30%, pRCA 50-60%, mRCA 40%  . Normochromic normocytic anemia 04/21/2013  . Abnormal CXR 04/21/2013  . Hypertensive kidney disease with CKD stage III 04/21/2013  . Prostate cancer    Past Surgical History  Procedure Laterality Date  . Prostate surgery    . Prostate surgury      hx of  . Esophagogastroduodenoscopy N/A 04/21/2013    Procedure: ESOPHAGOGASTRODUODENOSCOPY (EGD);  Surgeon: Irene Shipper, MD;  Location: [facility];  Service: Endoscopy;  Laterality: N/A;     Medications: Prior to Admission medications   Medication Sig Start Date End Date Taking? Authorizing Provider  allopurinol (ZYLOPRIM) 100 MG tablet Take 1 tablet (100 mg total) by mouth at bedtime. 09/08/13  Yes Jolaine Artist, MD  amiodarone (PACERONE) 200 MG tablet Take 0.5 tablets (100 mg total) by mouth every morning. 03/19/13  Yes Shaune Pascal Bensimhon, MD  amLODipine (NORVASC) 10 MG tablet Take 1 tablet (10 mg total) by mouth daily. 02/16/13  Yes Jolaine Artist, MD  aspirin EC 81 MG EC tablet Take 1 tablet (81 mg total) by mouth daily. 04/28/13  Yes Kinnie Feil, MD  atorvastatin (LIPITOR) 40 MG tablet Take 40 mg by mouth daily.    Yes Historical Provider, MD  citalopram (CELEXA) 10 MG tablet Take 20 mg by mouth daily.  07/29/12  Yes Biagio Borg, MD  cloNIDine (CATAPRES - DOSED IN MG/24 HR) 0.1 mg/24hr patch Place 1 patch (0.1 mg total) onto the skin once a week. Changes patch on Thursday's 10/02/13  Yes Jolaine Artist, MD  darbepoetin (ARANESP) 300 MCG/0.6ML SOLN injection Inject 300 mcg into the skin every 14 (fourteen) days.   Yes Historical Provider, MD  Fe Fum-Vit C-Vit B12-FA (TRIGELS-F) 460-60-0.01-1 MG CAPS capsule Take 1 capsule by mouth daily. 02/16/13  Yes Shaune Pascal Bensimhon, MD  Fluticasone-Salmeterol (ADVAIR DISKUS) 100-50 MCG/DOSE AEPB Inhale 1 puff into the lungs 2 (two) times daily. 06/05/12  Yes Renato Shin, MD  insulin detemir (LEVEMIR) 100 UNIT/ML injection Inject 1 mL (100 Units total) into the skin every morning. 09/23/13  Yes Biagio Borg, MD  isosorbide mononitrate (IMDUR) 60 MG 24 hr tablet Take 1 tablet (60 mg total) by mouth daily. 07/27/13  Yes Jolaine Artist, MD  omeprazole (PRILOSEC) 20 MG capsule Take 20 mg by mouth daily. 08/07/13  Yes Shaune Pascal Bensimhon, MD  potassium chloride SA (K-DUR,KLOR-CON) 20 MEQ tablet Take 1 tablet (20 mEq total) by mouth daily. 09/07/13  Yes Biagio Borg, MD  torsemide (DEMADEX) 20 MG tablet Take by mouth 2 (  two) times daily. Alternating days. Day 1: Take 40 mg at lunch and 40 mg at dinner.  Day 2: Take 40 mg at lunch and 30 mg at dinner.  Repeat. 05/22/13  Yes Biagio Borg, MD    Allergies:   Allergies  Allergen Reactions  . Ace Inhibitors Other (See Comments)    me  hyposion  . Beta Adrenergic Blockers Other (See Comments)     use cautiously secondary to 2nd degree heart block, hypotension    Social History:  Ambulatory  walker or  wheelchair bound,   Lives at home alone,       reports that he has quit smoking. He has never used smokeless tobacco. He reports that he does not drink alcohol or use illicit drugs.    Family History: family history includes  CAD in his sister; Dementia in his mother; Heart disease in his father.    Physical Exam: Patient Vitals for the past 24 hrs:  BP Temp Temp src Pulse Resp SpO2  10/21/13 2045 - - - - - 98 %  10/21/13 2022 171/72 mmHg - - 81 - 97 %  10/21/13 1758 174/79 mmHg 97.7 F (36.5 C) Oral 84 26 94 %    1. General:  in No Acute distress 2. Psychological: Alert and   Oriented 3. Head/ENT:   Moist  Mucous Membranes                          Head Non traumatic, neck supple                            Poor Dentition 4. SKIN: normal   Skin turgor,  Skin clean Dry extensive stasis ulcers on the dorsum of both would extremities bilaterally. Left great toe with small ulcer which does not appear to be infected 5. Heart: Regular rate and rhythm no Murmur, Rub or gallop 6. Lungs: Poor air movement fine  Crackles through out   7. Abdomen: Soft, non-tender, distended possible ascites 8. Lower extremities: no clubbing, cyanosis, or edema 9. Neurologically Grossly intact, moving all 4 extremities equally 10. MSK: Normal range of motion  body mass index is unknown because there is no weight on file.   Labs on Admission:   Recent Labs  10/21/13 1034 10/21/13 2012  NA 142 141  K 4.1 4.2  CL  --  104  CO2 22 20  GLUCOSE 101 173*  BUN 45.8* 45*  CREATININE 2.3* 2.08*  CALCIUM 8.7 8.9    Recent Labs  10/21/13 1034 10/21/13 2012  AST 16 21  ALT 17 15  ALKPHOS 73 77  BILITOT <0.20 0.3  PROT 6.5 7.2  ALBUMIN 2.3* 2.6*   No results found for this basename: LIPASE, AMYLASE,  in the last 72 hours  Recent Labs  10/21/13 1031 10/21/13 2012  WBC 5.2 5.2  NEUTROABS 3.7 3.9  HGB 7.5* 10.7*  HCT 25.4* 36.4*  MCV 88.6 89.4  PLT 402* 356    Recent Labs  10/21/13 2012  TROPONINI <0.30   No results found for this basename: TSH, T4TOTAL, FREET3, T3FREE, THYROIDAB,  in the last 72 hours No results found for this basename: VITAMINB12, FOLATE, FERRITIN, TIBC, IRON, RETICCTPCT,  in the last 72  hours Lab Results  Component Value Date   HGBA1C 5.0 07/09/2013    The CrCl is unknown because both a height and weight (above a minimum  accepted value) are required for this calculation. ABG    Component Value Date/Time   HCO3 22.3 09/02/2006 0228   TCO2 22 01/11/2011 1807   ACIDBASEDEF 3.0* 09/02/2006 0228     Lab Results  Component Value Date   DDIMER 3.71* 09/22/2010     Other results:  I have pearsonaly reviewed this: ECG REPORT  Rate: 79  Rhythm: NSR ST&T Change: no ischemia   BNP (last 3 results)  Recent Labs  04/03/13 1155 04/20/13 1116 10/21/13 2012  PROBNP 893.8* 1139.0* 1541.0*    There were no vitals filed for this visit.   Cultures:    Component Value Date/Time   SDES PLEURAL RIGHT 09/26/2010 1045   [place] 09/26/2010 1045   CULT NO GROWTH 3 DAYS 09/26/2010 1045   REPTSTATUS 09/29/2010 FINAL 09/26/2010 1045      Radiological Exams on Admission: Dg Chest 2 View  10/21/2013   CLINICAL DATA:  Shortness of breath  EXAM: CHEST  2 VIEW  COMPARISON:  04/20/2013  FINDINGS: Right upper lobe pneumonia. In the lateral projection there is also basilar consolidation, also likely on the right. Cardiomegaly with pulmonary venous congestion and diffuse interstitial opacity. Suspect small pleural effusions. No pneumothorax.  IMPRESSION: 1. Right-sided pneumonia. 2. Cardiomegaly and pulmonary edema.   Electronically Signed   By: Jorje Guild M.D.   On: 10/21/2013 20:09    Chart has been reviewed  Assessment/Plan  80 year old gentleman multiple medical problems including multifactorial anemia requiring recurrent transfusions, diastolic heart failure, chronic kidney disease, diabetes, prostate cancer metastatic spread to bone, myelodysplastic syndrome. Here with fluid overload after transfusion combined with chronic pulmonary disease likely pulmonary fibrosis possibly complicated by pulmonary hypertension.  Present on Admission:  . acute on chronic respiratory  failure - patient has long-standing history of respiratory failure requiring home oxygen at night likely secondary to chronic lung disease currently worsened by a combination of bronchiectasis exacerbation and fluid overload secondary to recent transfusion. We'll admit, provide oxygen, diuresis, pulmonary toilet and antibiotics IV  . Prostate cancer - this is chronic with possible metastatic spread need to discuss with oncology overall prognosis  . Hypertensive kidney disease with CKD stage III stable at baseline avoid nephrotoxic medications  . HYPERTENSION - continue home medications  . DIABETES MELLITUS, TYPE II, UNCONTROLLED - decrease Levemir while  patient is hospitalized down to 50 units  . CORONARY ARTERY DISEASE - chronic currently stable continue isosorbide hold aspirin given questionable blood in stool until Hemoccult stool has been done  . Atrial fibrillation - chronic currently appears to be in sinus rhythm on amiodarone. Patient with chronic anemia requiring recurrent transfusions not a good candidate for anticoagulation  . Anemia - multifactorial combination of iron deficiency could not exclude slow GI loss, anemia of chronic disease secondary to renal insufficiency, as well as anemia due to myelodysplastic disease followed by Dr. Clydene Laming Patient had been recently transfused and did develop worsening shortness of breath most likely secondary to fluid overload but transfusion reaction protocol has been sent to the emergency department. Patient is currently admitted for further evaluation and diuresis  . Acute on chronic diastolic CHF (congestive heart failure), NYHA class 1 - in the setting of transfusion likely secondary to volume overload. We'll give Lasix 40 2 times a day obtain repeat echo cycle cardiac enzymes repeat EKG  . Bronchiectasis with acute exacerbation - clinically as well as per CT findings suspicious for bronchiectasis. Will treat with antibiotics given worsening sputum  production. Pulmonology consult has  been called. Provide nebulizer treatments may benefit from a flutter valve such as Accupella Questionable melena versus blood in stools- we'll follow hemoglobin hold Lovenox hold aspirin for now obtain Hemoccult stools. Patient has been on iron supplements provided by his daughter. Make sure he is on Protonix Prophylaxis: SCD  , Protonix  CODE STATUS:    DNR/DNI as per patient's wishes  Other plan as per orders.  I have spent a total of 75 min on this admission extra time was taken to discuss case with pulmonology to review chart together with patient and his family. To review radiographs together with patient and family.  [place] 10/21/2013, 10:49 PM  [facility]  Pager [phone] Between the hours of 7 PM and 7 AM please contact floor coverage If 7AM-7PM, please contact the day team taking care of the patient  [email or web address]  Password [email or web address]

## 2013-10-22 ENCOUNTER — Inpatient Hospital Stay (HOSPITAL_COMMUNITY): Payer: Medicare PPO

## 2013-10-22 ENCOUNTER — Telehealth: Payer: Self-pay | Admitting: Medical Oncology

## 2013-10-22 ENCOUNTER — Telehealth: Payer: Self-pay | Admitting: Oncology

## 2013-10-22 ENCOUNTER — Telehealth: Payer: Self-pay | Admitting: *Deleted

## 2013-10-22 ENCOUNTER — Encounter: Payer: Self-pay | Admitting: Nurse Practitioner

## 2013-10-22 DIAGNOSIS — C7951 Secondary malignant neoplasm of bone: Secondary | ICD-10-CM | POA: Insufficient documentation

## 2013-10-22 DIAGNOSIS — D631 Anemia in chronic kidney disease: Secondary | ICD-10-CM

## 2013-10-22 DIAGNOSIS — N039 Chronic nephritic syndrome with unspecified morphologic changes: Secondary | ICD-10-CM

## 2013-10-22 DIAGNOSIS — J189 Pneumonia, unspecified organism: Secondary | ICD-10-CM

## 2013-10-22 DIAGNOSIS — J9620 Acute and chronic respiratory failure, unspecified whether with hypoxia or hypercapnia: Secondary | ICD-10-CM

## 2013-10-22 DIAGNOSIS — R0902 Hypoxemia: Secondary | ICD-10-CM

## 2013-10-22 DIAGNOSIS — R0602 Shortness of breath: Secondary | ICD-10-CM | POA: Insufficient documentation

## 2013-10-22 DIAGNOSIS — C7952 Secondary malignant neoplasm of bone marrow: Secondary | ICD-10-CM

## 2013-10-22 DIAGNOSIS — J9621 Acute and chronic respiratory failure with hypoxia: Secondary | ICD-10-CM | POA: Diagnosis present

## 2013-10-22 LAB — CBC WITH DIFFERENTIAL/PLATELET
Basophils Absolute: 0 10*3/uL (ref 0.0–0.1)
Basophils Relative: 0 % (ref 0–1)
EOS PCT: 6 % — AB (ref 0–5)
Eosinophils Absolute: 0.4 10*3/uL (ref 0.0–0.7)
HCT: 32.4 % — ABNORMAL LOW (ref 39.0–52.0)
Hemoglobin: 9.6 g/dL — ABNORMAL LOW (ref 13.0–17.0)
LYMPHS ABS: 0.3 10*3/uL — AB (ref 0.7–4.0)
Lymphocytes Relative: 5 % — ABNORMAL LOW (ref 12–46)
MCH: 26.3 pg (ref 26.0–34.0)
MCHC: 29.6 g/dL — ABNORMAL LOW (ref 30.0–36.0)
MCV: 88.8 fL (ref 78.0–100.0)
Monocytes Absolute: 0.5 10*3/uL (ref 0.1–1.0)
Monocytes Relative: 8 % (ref 3–12)
Neutro Abs: 5.7 10*3/uL (ref 1.7–7.7)
Neutrophils Relative %: 81 % — ABNORMAL HIGH (ref 43–77)
Platelets: 435 10*3/uL — ABNORMAL HIGH (ref 150–400)
RBC: 3.65 MIL/uL — AB (ref 4.22–5.81)
RDW: 15.9 % — AB (ref 11.5–15.5)
WBC: 7 10*3/uL (ref 4.0–10.5)

## 2013-10-22 LAB — PROTIME-INR
INR: 1.08 (ref 0.00–1.49)
PROTHROMBIN TIME: 14 s (ref 11.6–15.2)

## 2013-10-22 LAB — GLUCOSE, CAPILLARY
GLUCOSE-CAPILLARY: 162 mg/dL — AB (ref 70–99)
Glucose-Capillary: 171 mg/dL — ABNORMAL HIGH (ref 70–99)
Glucose-Capillary: 157 mg/dL — ABNORMAL HIGH (ref 70–99)
Glucose-Capillary: 186 mg/dL — ABNORMAL HIGH (ref 70–99)
Glucose-Capillary: 130 mg/dL — ABNORMAL HIGH (ref 70–99)

## 2013-10-22 LAB — PREALBUMIN: Prealbumin: 15.9 mg/dL — ABNORMAL LOW (ref 17.0–34.0)

## 2013-10-22 LAB — LEGIONELLA ANTIGEN, URINE: LEGIONELLA ANTIGEN, URINE: NEGATIVE

## 2013-10-22 LAB — COMPREHENSIVE METABOLIC PANEL
ALBUMIN: 2.6 g/dL — AB (ref 3.5–5.2)
ALT: 13 U/L (ref 0–53)
AST: 14 U/L (ref 0–37)
Alkaline Phosphatase: 75 U/L (ref 39–117)
Anion gap: 16 — ABNORMAL HIGH (ref 5–15)
BUN: 43 mg/dL — ABNORMAL HIGH (ref 6–23)
CALCIUM: 9.3 mg/dL (ref 8.4–10.5)
CO2: 21 mEq/L (ref 19–32)
CREATININE: 2.04 mg/dL — AB (ref 0.50–1.35)
Chloride: 108 mEq/L (ref 96–112)
GFR calc Af Amer: 34 mL/min — ABNORMAL LOW (ref >90)
GFR calc non Af Amer: 29 mL/min — ABNORMAL LOW (ref >90)
Glucose, Bld: 192 mg/dL — ABNORMAL HIGH (ref 70–99)
Potassium: 4 mEq/L (ref 3.7–5.3)
SODIUM: 145 meq/L (ref 137–147)
Total Bilirubin: 0.3 mg/dL (ref 0.3–1.2)
Total Protein: 7.2 g/dL (ref 6.0–8.3)

## 2013-10-22 LAB — CBC
HEMATOCRIT: 32 % — AB (ref 39.0–52.0)
HEMOGLOBIN: 9.3 g/dL — AB (ref 13.0–17.0)
MCH: 26.3 pg (ref 26.0–34.0)
MCHC: 29.1 g/dL — AB (ref 30.0–36.0)
MCV: 90.7 fL (ref 78.0–100.0)
Platelets: 379 10*3/uL (ref 150–400)
RBC: 3.53 MIL/uL — AB (ref 4.22–5.81)
RDW: 16.3 % — ABNORMAL HIGH (ref 11.5–15.5)
WBC: 8.3 10*3/uL (ref 4.0–10.5)

## 2013-10-22 LAB — TYPE AND SCREEN
ABO/RH(D): O POS
Antibody Screen: POSITIVE
DAT, IGG: NEGATIVE
Donor AG Type: NEGATIVE
Donor AG Type: NEGATIVE
PT AG TYPE: NEGATIVE
UNIT DIVISION: 0
Unit division: 0

## 2013-10-22 LAB — HEMOGLOBIN A1C
Hgb A1c MFr Bld: 5.8 % — ABNORMAL HIGH (ref <5.7)
Mean Plasma Glucose: 120 mg/dL — ABNORMAL HIGH (ref <117)

## 2013-10-22 LAB — TSH: TSH: 2.54 u[IU]/mL (ref 0.350–4.500)

## 2013-10-22 LAB — TRANSFUSION REACTION
DAT C3: NEGATIVE
Post RXN DAT IgG: NEGATIVE

## 2013-10-22 LAB — STREP PNEUMONIAE URINARY ANTIGEN: STREP PNEUMO URINARY ANTIGEN: NEGATIVE

## 2013-10-22 LAB — TESTOSTERONE: TESTOSTERONE: 133 ng/dL — AB (ref 300–890)

## 2013-10-22 LAB — PSA: PSA: 1.09 ng/mL (ref <=4.00)

## 2013-10-22 LAB — MAGNESIUM: MAGNESIUM: 2.1 mg/dL (ref 1.5–2.5)

## 2013-10-22 LAB — TROPONIN I
Troponin I: <0.3 ng/mL (ref <0.30)
Troponin I: <0.3 ng/mL (ref <0.30)

## 2013-10-22 MED ORDER — SODIUM CHLORIDE 0.9 % IJ SOLN: 3.0000 mL | INTRAMUSCULAR | Status: DC | PRN

## 2013-10-22 MED ORDER — SODIUM CHLORIDE 0.9 % IJ SOLN
3.0000 mL | Freq: Two times a day (BID) | INTRAMUSCULAR | Status: DC
  Administered 2013-10-22 – 2013-10-26 (×6): 3 mL via INTRAVENOUS

## 2013-10-22 MED ORDER — INSULIN ASPART 100 UNIT/ML ~~LOC~~ SOLN
0.0000 [IU] | Freq: Three times a day (TID) | SUBCUTANEOUS | Status: DC
  Administered 2013-10-22 – 2013-10-23 (×4): 2 [IU] via SUBCUTANEOUS
  Administered 2013-10-23 – 2013-10-24 (×2): 1 [IU] via SUBCUTANEOUS
  Administered 2013-10-24 – 2013-10-25 (×4): 2 [IU] via SUBCUTANEOUS
  Administered 2013-10-26 (×2): 1 [IU] via SUBCUTANEOUS

## 2013-10-22 MED ORDER — AMIODARONE HCL 100 MG PO TABS
100.0000 mg | ORAL_TABLET | Freq: Every morning | ORAL | Status: DC
Start: 2013-10-22 — End: 2013-10-26
  Administered 2013-10-22 – 2013-10-26 (×5): 100 mg via ORAL
  Filled 2013-10-22 (×5): qty 1

## 2013-10-22 MED ORDER — ALLOPURINOL 100 MG PO TABS
100.0000 mg | ORAL_TABLET | Freq: Every day | ORAL | Status: DC
  Administered 2013-10-22 – 2013-10-25 (×5): 100 mg via ORAL
  Filled 2013-10-22 (×6): qty 1

## 2013-10-22 MED ORDER — VANCOMYCIN HCL 10 G IV SOLR
2000.0000 mg | Freq: Once | INTRAVENOUS | Status: AC
  Administered 2013-10-22: 2000 mg via INTRAVENOUS
  Filled 2013-10-22: qty 2000

## 2013-10-22 MED ORDER — ATORVASTATIN CALCIUM 40 MG PO TABS
40.0000 mg | ORAL_TABLET | Freq: Every day | ORAL | Status: DC
  Administered 2013-10-22 – 2013-10-25 (×4): 40 mg via ORAL
  Filled 2013-10-22 (×5): qty 1

## 2013-10-22 MED ORDER — SODIUM CHLORIDE 0.9 % IV SOLN
250.0000 mL | INTRAVENOUS | Status: DC | PRN
  Administered 2013-10-22: 250 mL via INTRAVENOUS

## 2013-10-22 MED ORDER — IPRATROPIUM-ALBUTEROL 0.5-2.5 (3) MG/3ML IN SOLN
3.0000 mL | Freq: Four times a day (QID) | RESPIRATORY_TRACT | Status: AC
  Administered 2013-10-22 – 2013-10-24 (×8): 3 mL via RESPIRATORY_TRACT
  Filled 2013-10-22 (×8): qty 3

## 2013-10-22 MED ORDER — IPRATROPIUM-ALBUTEROL 0.5-2.5 (3) MG/3ML IN SOLN
3.0000 mL | Freq: Four times a day (QID) | RESPIRATORY_TRACT | Status: DC
Start: 2013-10-22 — End: 2013-10-22
  Administered 2013-10-22 (×2): 3 mL via RESPIRATORY_TRACT
  Filled 2013-10-22 (×2): qty 3

## 2013-10-22 MED ORDER — INSULIN ASPART 100 UNIT/ML ~~LOC~~ SOLN: 0.0000 [IU] | Freq: Every day | SUBCUTANEOUS | Status: DC

## 2013-10-22 MED ORDER — MOMETASONE FURO-FORMOTEROL FUM 100-5 MCG/ACT IN AERO
2.0000 | INHALATION_SPRAY | Freq: Two times a day (BID) | RESPIRATORY_TRACT | Status: DC
  Administered 2013-10-22 – 2013-10-26 (×10): 2 via RESPIRATORY_TRACT
  Filled 2013-10-22: qty 8.8

## 2013-10-22 MED ORDER — SODIUM CHLORIDE 0.9 % IV SOLN: 250.0000 mL | INTRAVENOUS | Status: DC | PRN

## 2013-10-22 MED ORDER — PIPERACILLIN-TAZOBACTAM 3.375 G IVPB
3.3750 g | Freq: Three times a day (TID) | INTRAVENOUS | Status: DC
  Administered 2013-10-22 – 2013-10-23 (×4): 3.375 g via INTRAVENOUS
  Filled 2013-10-22 (×5): qty 50

## 2013-10-22 MED ORDER — HEPARIN SOD (PORK) LOCK FLUSH 100 UNIT/ML IV SOLN
250.0000 [IU] | INTRAVENOUS | Status: DC | PRN
  Filled 2013-10-22: qty 3

## 2013-10-22 MED ORDER — IPRATROPIUM BROMIDE 0.02 % IN SOLN: 0.5000 mg | Freq: Four times a day (QID) | RESPIRATORY_TRACT | Status: DC

## 2013-10-22 MED ORDER — SILVER SULFADIAZINE 1 % EX CREA
TOPICAL_CREAM | Freq: Two times a day (BID) | CUTANEOUS | Status: AC
  Administered 2013-10-22 – 2013-10-24 (×6): via TOPICAL
  Filled 2013-10-22: qty 85

## 2013-10-22 MED ORDER — ACETAMINOPHEN 325 MG PO TABS: 650.0000 mg | ORAL_TABLET | ORAL | Status: DC | PRN

## 2013-10-22 MED ORDER — PANTOPRAZOLE SODIUM 40 MG PO TBEC
40.0000 mg | DELAYED_RELEASE_TABLET | Freq: Every day | ORAL | Status: DC
Start: 2013-10-22 — End: 2013-10-26
  Administered 2013-10-22 – 2013-10-26 (×5): 40 mg via ORAL
  Filled 2013-10-22 (×5): qty 1

## 2013-10-22 MED ORDER — SILVER SULFADIAZINE 1 % EX CREA
TOPICAL_CREAM | Freq: Every day | CUTANEOUS | Status: DC
  Administered 2013-10-25 – 2013-10-26 (×2): via TOPICAL

## 2013-10-22 MED ORDER — GUAIFENESIN-DM 100-10 MG/5ML PO SYRP
5.0000 mL | ORAL_SOLUTION | ORAL | Status: DC | PRN
  Administered 2013-10-22: 5 mL via ORAL
  Filled 2013-10-22: qty 10

## 2013-10-22 MED ORDER — PIPERACILLIN-TAZOBACTAM 3.375 G IVPB 30 MIN
3.3750 g | Freq: Once | INTRAVENOUS | Status: AC
  Administered 2013-10-22: 3.375 g via INTRAVENOUS
  Filled 2013-10-22: qty 50

## 2013-10-22 MED ORDER — SILVER SULFADIAZINE 1 % EX CREA: TOPICAL_CREAM | Freq: Two times a day (BID) | CUTANEOUS | Status: DC

## 2013-10-22 MED ORDER — ASPIRIN EC 81 MG PO TBEC
81.0000 mg | DELAYED_RELEASE_TABLET | Freq: Every day | ORAL | Status: DC
  Administered 2013-10-22 – 2013-10-26 (×5): 81 mg via ORAL
  Filled 2013-10-22 (×5): qty 1

## 2013-10-22 MED ORDER — SODIUM CHLORIDE 0.9 % IJ SOLN: 3.0000 mL | Freq: Two times a day (BID) | INTRAMUSCULAR | Status: DC

## 2013-10-22 MED ORDER — ONDANSETRON HCL 4 MG/2ML IJ SOLN: 4.0000 mg | Freq: Four times a day (QID) | INTRAMUSCULAR | Status: DC | PRN

## 2013-10-22 MED ORDER — FUROSEMIDE 10 MG/ML IJ SOLN
40.0000 mg | Freq: Two times a day (BID) | INTRAMUSCULAR | Status: DC
  Administered 2013-10-22 – 2013-10-24 (×5): 40 mg via INTRAVENOUS
  Filled 2013-10-22 (×8): qty 4

## 2013-10-22 MED ORDER — ALBUTEROL SULFATE (2.5 MG/3ML) 0.083% IN NEBU: 2.5000 mg | INHALATION_SOLUTION | RESPIRATORY_TRACT | Status: DC | PRN

## 2013-10-22 MED ORDER — CITALOPRAM HYDROBROMIDE 20 MG PO TABS
20.0000 mg | ORAL_TABLET | Freq: Every day | ORAL | Status: DC
  Administered 2013-10-22 – 2013-10-26 (×5): 20 mg via ORAL
  Filled 2013-10-22 (×5): qty 1

## 2013-10-22 MED ORDER — VANCOMYCIN HCL 10 G IV SOLR: 1500.0000 mg | INTRAVENOUS | Status: DC

## 2013-10-22 MED ORDER — PANTOPRAZOLE SODIUM 40 MG IV SOLR
40.0000 mg | Freq: Every day | INTRAVENOUS | Status: DC
  Administered 2013-10-22: 40 mg via INTRAVENOUS
  Filled 2013-10-22 (×2): qty 40

## 2013-10-22 MED ORDER — ISOSORBIDE MONONITRATE ER 60 MG PO TB24
60.0000 mg | ORAL_TABLET | Freq: Every day | ORAL | Status: DC
  Administered 2013-10-22 – 2013-10-26 (×5): 60 mg via ORAL
  Filled 2013-10-22 (×5): qty 1

## 2013-10-22 MED ORDER — CLONIDINE HCL 0.1 MG/24HR TD PTWK: 0.1000 mg | MEDICATED_PATCH | TRANSDERMAL | Status: DC

## 2013-10-22 MED ORDER — AMLODIPINE BESYLATE 10 MG PO TABS
10.0000 mg | ORAL_TABLET | Freq: Every day | ORAL | Status: DC
  Administered 2013-10-22 – 2013-10-26 (×5): 10 mg via ORAL
  Filled 2013-10-22 (×5): qty 1

## 2013-10-22 MED ORDER — GUAIFENESIN ER 600 MG PO TB12
600.0000 mg | ORAL_TABLET | Freq: Two times a day (BID) | ORAL | Status: DC
  Administered 2013-10-22 – 2013-10-26 (×10): 600 mg via ORAL
  Filled 2013-10-22 (×11): qty 1

## 2013-10-22 MED ORDER — INSULIN DETEMIR 100 UNIT/ML ~~LOC~~ SOLN
50.0000 [IU] | Freq: Every day | SUBCUTANEOUS | Status: DC
  Administered 2013-10-22 – 2013-10-26 (×5): 50 [IU] via SUBCUTANEOUS
  Filled 2013-10-22 (×5): qty 0.5

## 2013-10-22 MED ORDER — ACETAMINOPHEN 325 MG PO TABS: 650.0000 mg | ORAL_TABLET | Freq: Four times a day (QID) | ORAL | Status: DC | PRN

## 2013-10-22 NOTE — Assessment & Plan Note (Signed)
Patient has a history of chronic COPD and CHF.  He does take a diuretic every morning; and every other evening.  He states that he has been having some URI symptoms for the past week or so.  He complains of a productive cough of a dark brown sputum.  He denies any recent fevers or chills.  He does not typically wear O2 per nasal cannula at night only.  Patient was finishing up his second unit of blood this afternoon; and developed a worsening in just a cough, and shortness of breath.  O2 was applied at 2 L via nasal cannula.  Patient was then transported to the emergency department for further evaluation of possible pneumonia versus CHF exacerbation versus fluid overload.  A report was called to the emergency department charge nurse as well.

## 2013-10-22 NOTE — Progress Notes (Addendum)
PROGRESS NOTE    [name] YIF:[id number] DOB: [birth year] DOA: 10/21/2013 PCP: [name], MD  HPI/Brief narrative 80 year old male patient with history of chronic diastolic CHF, A. fib-poor Coumadin candidate, CAD, COPD CVA, HLD, HTN, type II DM, OSA, CKD3, prostate cancer status post prostatectomy-sclerotic lesion at the spine being evaluated OP for metastasis, multifactorial anemia (renal disease, iron deficiency and possible MDS)-getting 2 weekly PRBC transfusion, Aranesp shots and iron supplements, chronic respiratory failure on nightly oxygen, transferred from the [facility] secondary to worsening cough and dyspnea post blood transfusion. He is history of URI and productive sputum for a week prior to admission. No fevers or chills reported. He was admitted for acute on chronic respiratory failure secondary to decompensated CHF and possible pneumonia. Pulmonary consulted.   Assessment/Plan:  1. Acute on chronic diastolic CHF: Precipitated by volume overload from blood transfusion. Treated with IV Lasix 40 mg twice a day. Improved. 2. Possible healthcare associated pneumonia: Continue IV vancomycin and Zosyn. 3. Acute on chronic respiratory failure with hypoxia: Secondary to decompensated CHF and pneumonia complicating underlying COPD, OSA. Pulmonary consultation appreciated. Continue diuretics and antibiotics as above. Continue supportive treatment with oxygen, bronchodilators and Dulera. Improving. 4. COPD: No bronchospasm on exam. Management as above. 5. Hypertension: Mildly uncontrolled. Continue amlodipine & transdermal clonidine. Medications may need titration. 6. History of A. fib: Continue amiodarone. Not a candidate for anticoagulation. Sinus rhythm on monitor. 7. Multifactorial anemia: Status post 2 units PRBC transfusion on 9/2. Improved. Follow CBCs. 8. Uncontrolled type II DM with renal complications: Continue Levemirand decreased dose and SSI. 9. History of  hyperlipidemia: Continue statins. 10. History of prostate cancer: Being followed by [name] OP. Concern for sclerotic bone met and supposed to have restaging whole body scan on 11/03/13 11. History of CAD: Stable. Continue nitrates & ASA 12. ? Rectal bleed: Family reported blood in his depends x1 but not sure. Also reported black stools but has been on iron supplements. Check stool for occult blood. 13. Stage III chronic kidney disease: Creatinine at baseline. 14. Constipation: Bowel regimen. May be the reason for problem #2   Code Status: DO NOT RESUSCITATE Family Communication: Discussed with 3 sons at bedside. Disposition Plan: Home when medically stable   Consultants:  Pulmonology  Procedures:  None  Antibiotics:  IV vancomycin 9/2 >  IV Zosyn 9/2 >  Subjective: Patient states that he feels much better. States that dyspnea has improved/return to baseline. Complains of some nonproductive cough. No chest pain. As per family, patient has improved.  Objective: Filed Vitals:   10/22/13 0130 10/22/13 0427 10/22/13 0825 10/22/13 1245  BP:  156/67  178/72  Pulse:  85  82  Temp:  98.9 F (37.2 C)  98.2 F (36.8 C)  TempSrc:  Oral  Oral  Resp:  18  20  Height:      Weight:      SpO2: 92% 99% 95% 97%    Intake/Output Summary (Last 24 hours) at 10/22/13 1401 Last data filed at 10/22/13 1245  Gross per 24 hour  Intake    800 ml  Output   1350 ml  Net   -550 ml   Filed Weights   10/22/13 0053  Weight: 108.3 kg (238 lb 12.1 oz)     Exam:  General exam: Pleasant elderly male sitting at edge of bed this morning and in no obvious distress. Respiratory system: Reduced breath sounds right lung fields greater than left with scattered crackles in the right  lung fields. No increased work of breathing. Cardiovascular system: S1 & S2 heard, RRR. No JVD, murmurs, gallops, clicks. Trace bilateral leg edema. Telemetry: Sinus rhythm with first degree AV block Gastrointestinal  system: Abdomen is nondistended, soft and nontender. Normal bowel sounds heard. Central nervous system: Alert and oriented. No focal neurological deficits. Extremities: Symmetric 5 x 5 power.   Data Reviewed: Basic Metabolic Panel:  Recent Labs Lab 10/21/13 1034 10/21/13 2012 10/22/13 0112  NA 142 141 145  K 4.1 4.2 4.0  CL  --  104 108  CO2 22 20 21   GLUCOSE 101 173* 192*  BUN 45.8* 45* 43*  CREATININE 2.3* 2.08* 2.04*  CALCIUM 8.7 8.9 9.3  MG  --   --  2.1   Liver Function Tests:  Recent Labs Lab 10/21/13 1034 10/21/13 2012 10/22/13 0112  AST 16 21 14   ALT 17 15 13   ALKPHOS 73 77 75  BILITOT <0.20 0.3 0.3  PROT 6.5 7.2 7.2  ALBUMIN 2.3* 2.6* 2.6*   No results found for this basename: LIPASE, AMYLASE,  in the last 168 hours No results found for this basename: AMMONIA,  in the last 168 hours CBC:  Recent Labs Lab 10/21/13 1031 10/21/13 2012 10/22/13 0112 10/22/13 1239  WBC 5.2 5.2 7.0 8.3  NEUTROABS 3.7 3.9 5.7  --   HGB 7.5* 10.7* 9.6* 9.3*  HCT 25.4* 36.4* 32.4* 32.0*  MCV 88.6 89.4 88.8 90.7  PLT 402* 356 435* 379   Cardiac Enzymes:  Recent Labs Lab 10/21/13 2012 10/22/13 0112 10/22/13 0630 10/22/13 1239  TROPONINI <0.30 <0.30 <0.30 <0.30   BNP (last 3 results)  Recent Labs  04/03/13 1155 04/20/13 1116 10/21/13 2012  PROBNP 893.8* 1139.0* 1541.0*   CBG:  Recent Labs Lab 10/22/13 0102 10/22/13 0744 10/22/13 1157  GLUCAP 171* 157* 186*    No results found for this or any previous visit (from the past 240 hour(s)).      Studies: Dg Chest 2 View  10/21/2013   CLINICAL DATA:  Shortness of breath  EXAM: CHEST  2 VIEW  COMPARISON:  04/20/2013  FINDINGS: Right upper lobe pneumonia. In the lateral projection there is also basilar consolidation, also likely on the right. Cardiomegaly with pulmonary venous congestion and diffuse interstitial opacity. Suspect small pleural effusions. No pneumothorax.  IMPRESSION: 1. Right-sided  pneumonia. 2. Cardiomegaly and pulmonary edema.   Electronically Signed   By: Jorje Guild M.D.   On: 10/21/2013 20:09   Dg Abd 1 View  10/22/2013   CLINICAL DATA:  Abdominal pain and distention, history coronary artery disease, hypertension, diabetes, chronic diastolic heart failure, stroke, prostate cancer  EXAM: ABDOMEN - 1 VIEW  COMPARISON:  None ; correlation made with radionuclide bone scan 04/24/2013  FINDINGS: Scattered gas and stool in colon.  Few nonspecific upper normal caliber small bowel loops in mid abdomen.  No definite evidence of obstruction or wall thickening.  Slightly increased stool in colon.  Sclerotic L2 vertebral body question osseous metastatic disease.  Underpenetration limits assessment of remaining osseous structures.  Surgical clips in pelvis bilaterally.  IMPRESSION: Nonobstructive bowel gas pattern.  Slightly prominent stool in rectum.  Sclerotic L2 vertebral body suspicious for sclerotic osseous metastatic disease in this patient with a history of prostate cancer ; this corresponds to previously identified scintigraphic abnormality.   Electronically Signed   By: Lavonia Dana M.D.   On: 10/22/2013 10:44   Ct Chest Wo Contrast  10/22/2013   CLINICAL DATA:  Shortness of  breath, history of prostate cancer  EXAM: CT CHEST WITHOUT CONTRAST  TECHNIQUE: Multidetector CT imaging of the chest was performed following the standard protocol without IV contrast.  COMPARISON:  10/21/2013 chest radiograph, 04/22/2013 chest CT  FINDINGS: Scattered atherosclerotic disease of the aorta and branch vessels without aneurysmal dilatation.  Cardiomegaly. Coronary artery and aortic valvular calcifications. No pericardial effusion.  Small bilateral pleural effusions. Persistent mediastinal adenopathy. As index right infra carinal measuring 2.4 cm short axis on image 29 series 2, 2.2 cm on the prior. Right hilar nodes also appear prominent but difficult to separate from adjacent vessels.  Small hiatal  hernia.  Upper abdominal images show nothing acute.  Central airways show some mucous/secretions along the right wall of the trachea. Left lower lobe mild bronchial narrowing.  Centrilobular emphysema. Increased interstitial and hazy airspace opacities along the periphery of the right greater than left lung and lower lobes.  Multifocal sclerotic lesions.  IMPRESSION: Small pleural effusions. Interstitial and hazy airspace opacities may reflect pulmonary edema or multifocal pneumonia superimposed on COPD.  Prominent mediastinal lymph nodes persist. Sclerotic metastases again noted.  Cardiomegaly.   Electronically Signed   By: Carlos Levering M.D.   On: 10/22/2013 00:19        Scheduled Meds: . allopurinol  100 mg Oral QHS  . amiodarone  100 mg Oral q morning - 10a  . amLODipine  10 mg Oral Daily  . aspirin EC  81 mg Oral Daily  . atorvastatin  40 mg Oral Daily  . citalopram  20 mg Oral Daily  . [START ON 10/28/2013] cloNIDine  0.1 mg Transdermal Weekly  . furosemide  40 mg Intravenous BID  . guaiFENesin  600 mg Oral BID  . insulin aspart  0-5 Units Subcutaneous QHS  . insulin aspart  0-9 Units Subcutaneous TID WC  . insulin detemir  50 Units Subcutaneous Daily  . ipratropium-albuterol  3 mL Nebulization Q6H  . isosorbide mononitrate  60 mg Oral Daily  . mometasone-formoterol  2 puff Inhalation BID  . pantoprazole  40 mg Oral Daily  . pantoprazole (PROTONIX) IV  40 mg Intravenous QHS  . piperacillin-tazobactam (ZOSYN)  IV  3.375 g Intravenous Q8H  . silver sulfADIAZINE   Topical BID   Followed by  . [START ON 10/25/2013] silver sulfADIAZINE   Topical Daily  . sodium chloride  3 mL Intravenous Q12H  . sodium chloride  3 mL Intravenous Q12H  . [START ON 10/24/2013] vancomycin  1,500 mg Intravenous Q48H   Continuous Infusions:   Active Problems:   DIABETES MELLITUS, TYPE II, UNCONTROLLED   HYPERTENSION   CORONARY ARTERY DISEASE   Atrial fibrillation   Chronic diastolic heart failure    Anemia   Hypertensive kidney disease with CKD stage III   Prostate cancer   Chronic respiratory failure   Acute on chronic diastolic CHF (congestive heart failure), NYHA class 1   Bronchiectasis with acute exacerbation    Time spent: 50 minutes.    Vernell Leep, MD, FACP, FHM. [facility] Pager [phone]  If 7PM-7AM, please contact night-coverage [email or web address] Password [email or web address] 10/22/2013, 2:01 PM    LOS: 1 day

## 2013-10-22 NOTE — Assessment & Plan Note (Signed)
Patient has a questionable sclerotic lesions to the spine.  Patient is scheduled for a whole body scan on 11/03/2013 to fully up evaluate.

## 2013-10-22 NOTE — Assessment & Plan Note (Signed)
Patient has a known history of prostate cancer; and underwent a prostatectomy approximate 15 years ago.  He does now have questionable sclerotic lesions to the spine; with a PSA that is slightly elevated.  The plan is to restage patient with a whole-body scan on 11/03/2013.

## 2013-10-22 NOTE — Consult Note (Signed)
WOC wound consult note Reason for Consult:Builateral stasis ulcerations, R>L Wound type: venous insufficiency in a medically complex patient Pressure Ulcer POA: No Measurement:Right LE with affected area measuring 15cm x 7cm with a central ulcer (cull thickness) measuring 7cm x 4cm.  The wound bed is 75% pink and 25% with necrotic slough.  There is scant to no drainage as his LEs have been elevated since admission. The left LE is with a 7cm x 4cm affected area and a newly reepithelialized ulcer. There is no depth, no drainage and a thin layer of epithelial tissue evident Wound bed:As described above Drainage (amount, consistency, odor) As described above Periwound: Hemosiderin staining present with several areas of previously healed tissue noted. Dressing procedure/placement/frequency: I will implement a twice daily (for three days, then decrease to daily for three weeks) treatment plan with application of silver sulfadiazine cream (antimicrobial) topped with a moist saline dressing and secured with kerlix.  This dressing is topped with an ACE bandage for mild compression that will not compromise the cardiac condition. I will also provide a Prevalon boot to the Right foot to align foot so that it is not resting on the lateral LE ulcerations. [place] nursing team will not follow, but will remain available to this patient, the nursing and medical teams.  Please re-consult if needed. Thanks, Maudie Flakes, MSN, RN, [place], [place], [place] [phone])

## 2013-10-22 NOTE — Assessment & Plan Note (Signed)
Multifactorial anemia-the patient hasn't element of anemia of renal disease; as well as iron deficiency anemia the patient underwent a bone marrow biopsy on 09/09/2013 of which did reveal possible myelodysplastic syndrome.  Patient has been receiving packed red blood cell transfusions approximately every 2 weeks.  He is also receiving Aranesp injections on an every two-week basis as well.  He will also continue his iron supplementation as well.

## 2013-10-22 NOTE — Telephone Encounter (Signed)
added appt for [name] for 9.2.15....sent msg to [name] to arrive pt.

## 2013-10-22 NOTE — Progress Notes (Signed)
INITIAL NUTRITION ASSESSMENT  DOCUMENTATION CODES Per approved criteria  -Obesity Unspecified   INTERVENTION: -Reviewed and encouraged compliance to DM2/Heart Healthy diet recommendations -Encouraged continued excellent PO intake -Will continue to monitor  NUTRITION DIAGNOSIS: Increased nutrient needs (protein) related to increased demand for nutrients as evidenced by prostate cancer, skin integrity/wound healing  Goal: Pt to meet >/= 90% of their estimated nutrition needs    Monitor:  Total protein/energy intake, labs, weights, glucose/renal profile, skin integrity, diet education needs  Reason for Assessment: Consult to Assess  80 y.o. male  Admitting Dx: <principal problem not specified>  ASSESSMENT: Presented with Patient has hx of anemia due to combination of anemia of chronic disease, myelodysplastic disorder and iron deficiency anemia on Aranesp requiring recurrent transfusions. Today he was transfused at cancer cancer and he became short of breath and feeling congested.  -Denied any changes in appetite pta. Family reported pt consumes three meals daily w/snacks. Will typically consume cereal w/2% milk for breakfast and lunch (Total, Raisin Bran, Cheerios). Snacks on cereals or fruits. Dinner consists of chicken, fish or beans with vegetables. -Has been modifying diet to comply with heart healthy recommendations; decreasing fried foods, limiting use of salt, and decreasing intake of red or processed meats -Son noted an overall weight loss of 100 lbs over one year d/t diet modifications -Pt reported weight fluctuations with fluid accumulation, unable to determine a usual body weight -Currently eating well, > 75% of breakfast -Explained current diet restrictions, verbalized understanding. Encouraged pt or family to contact RD with additional questions or nutrition related concerns. Declined nutrition educational materials. -WOC evaluation performed on 9/03  for diabetic  ulcers on right and left legs; noted full thickness ulcer w/no depth or drainage evident  Height: Ht Readings from Last 1 Encounters:  10/22/13 5' 10.5" (1.791 m)    Weight: Wt Readings from Last 1 Encounters:  10/22/13 238 lb 12.1 oz (108.3 kg)    Ideal Body Weight: 166 lbs  % Ideal Body Weight: 143%  Wt Readings from Last 10 Encounters:  10/22/13 238 lb 12.1 oz (108.3 kg)  09/23/13 240 lb 14.4 oz (109.272 kg)  09/15/13 240 lb (108.863 kg)  09/09/13 240 lb (108.863 kg)  08/26/13 240 lb 6.4 oz (109.045 kg)  08/12/13 243 lb 3.2 oz (110.315 kg)  07/09/13 228 lb (103.42 kg)  06/10/13 238 lb 14.4 oz (108.364 kg)  05/21/13 240 lb 6 oz (109.033 kg)  05/06/13 237 lb 8 oz (107.729 kg)    Usual Body Weight: unable to determine d/t fluid  % Usual Body Weight: unable to determine d/t fluid  BMI:  Body mass index is 33.76 kg/(m^2).  Estimated Nutritional Needs: Kcal: 2000-2200 Protein: 85-100 gram Fluid: 1200 ml per MD  Skin: diabetic ulcers on right and left leg, +2 RLE edema, +3 LLE edema  Diet Order: Diabetic  EDUCATION NEEDS: -Education needs addressed   Intake/Output Summary (Last 24 hours) at 10/22/13 1031 Last data filed at 10/22/13 0435  Gross per 24 hour  Intake    200 ml  Output    950 ml  Net   -750 ml    Last BM: PTA   Labs:   Recent Labs Lab 10/21/13 1034 10/21/13 2012 10/22/13 0112  NA 142 141 145  K 4.1 4.2 4.0  CL  --  104 108  CO2 22 20 21   BUN 45.8* 45* 43*  CREATININE 2.3* 2.08* 2.04*  CALCIUM 8.7 8.9 9.3  MG  --   --  2.1  GLUCOSE 101 173* 192*    CBG (last 3)   Recent Labs  10/22/13 0102 10/22/13 0744  GLUCAP 171* 157*    Scheduled Meds: . allopurinol  100 mg Oral QHS  . amiodarone  100 mg Oral q morning - 10a  . amLODipine  10 mg Oral Daily  . aspirin EC  81 mg Oral Daily  . atorvastatin  40 mg Oral Daily  . citalopram  20 mg Oral Daily  . [START ON 10/28/2013] cloNIDine  0.1 mg Transdermal Weekly  . furosemide  40  mg Intravenous BID  . guaiFENesin  600 mg Oral BID  . insulin aspart  0-5 Units Subcutaneous QHS  . insulin aspart  0-9 Units Subcutaneous TID WC  . insulin detemir  50 Units Subcutaneous Daily  . ipratropium-albuterol  3 mL Nebulization Q6H WA  . isosorbide mononitrate  60 mg Oral Daily  . mometasone-formoterol  2 puff Inhalation BID  . pantoprazole  40 mg Oral Daily  . pantoprazole (PROTONIX) IV  40 mg Intravenous QHS  . piperacillin-tazobactam (ZOSYN)  IV  3.375 g Intravenous Q8H  . silver sulfADIAZINE   Topical BID   Followed by  . [START ON 10/25/2013] silver sulfADIAZINE   Topical Daily  . sodium chloride  3 mL Intravenous Q12H  . sodium chloride  3 mL Intravenous Q12H  . [START ON 10/24/2013] vancomycin  1,500 mg Intravenous Q48H    Continuous Infusions:   Past Medical History  Diagnosis Date  . Chronic diastolic heart failure     secondary to diastolic dysfunction EF (previous EF 35-45%)ef 60%4/09  . Arrhythmia     atrial fibrillation /pt on amiodarone,thought to be poor coumadin  . Stroke   . Other and unspecified hyperlipidemia   . Hypertension   . Hypertrophy of prostate without urinary obstruction and other lower urinary tract symptoms (LUTS)   . Osteoarthrosis, unspecified whether generalized or localized, unspecified site   . Type II or unspecified type diabetes mellitus without mention of complication, not stated as uncontrolled   . AV block, [place] 1     Intolerance to ACE's / discontiuation of beta blockers  . Obstructive sleep apnea   . Iron deficiency anemia, unspecified     s/p EGD and coloscopy 3/10.gastritis.hemorrhids  . Cerebrovascular accident   . Renal insufficiency   . Obesity   . Allergic rhinitis, cause unspecified 05/29/2010  . CAD (coronary artery disease)     cath 12/04: mLAD 50-60%, mCFX 20-30%, pRCA 50-60%, mRCA 40%  . Normochromic normocytic anemia 04/21/2013  . Abnormal CXR 04/21/2013  . Hypertensive kidney disease with CKD stage III 04/21/2013   . Prostate cancer     Past Surgical History  Procedure Laterality Date  . Prostate surgery    . Prostate surgury      hx of  . Esophagogastroduodenoscopy N/A 04/21/2013    Procedure: ESOPHAGOGASTRODUODENOSCOPY (EGD);  Surgeon: Irene Shipper, MD;  Location: [facility];  Service: Endoscopy;  Laterality: N/A;    Atlee Abide MS RD LDN Clinical Dietitian [phone]

## 2013-10-22 NOTE — Consult Note (Signed)
Name: [name] MRN: [id number] DOB: 11-May-1932    ADMISSION DATE:  10/21/2013 CONSULTATION DATE:  10/21/2013  REFERRING MD :  Roel Cluck PRIMARY SERVICE:  TRH  CHIEF COMPLAINT:  SOB  BRIEF PATIENT DESCRIPTION: 80 y.o. M with multiple co-morbidities, presented to [facility] ED 9/2 with acute SOB after he received a blood transfusion at cancer center earlier that day (receives recurrent transfusions for anemia due to combo of chronic disease, myelodysplastic disorder, and iron deficiency anemia).  In ED, given breathing treatment with little relief.  CXR and CT chest suggestive of RLL PNA.  PCCM consulted.  SIGNIFICANT EVENTS / STUDIES:  9/2 - received blood transfusion at cancer center.  Later became acutely SOB prompting him to come to ED. 9/2 CT Chest >>> Small b/l pleural effusions, Interstitial and hazy airspace opacities may reflect pulmonary edema or multifocal pneumonia superimposed on COPD. Prominent mediastinal lymph nodes. Sclerotic metastases again noted. Cardiomegaly.   LINES / TUBES: PIV  CULTURES: Sputum 9/3 >>> U. Strep 9/3 >>> U. Legionella 9/3 >>>  ANTIBIOTICS: Vanc 9/3 >>> Zosyn 9/3 >>>  HISTORY OF PRESENT ILLNESS:  [name] is a 80 y.o. M with multiple co-morbidities who presented to [facility] ED on 9/2 with SOB that he developed following a blood transfusion at the cancer center earlier that day. He has recurrent transfusions for anemia due to a combination of chronic disease, myelodysplastic disorder, and iron deficiency.  He is followed by Dr. Clydene Laming.  He tells me that he received transfusion and shortly thereafter, became SOB and "really congested".  Since he arrived in ED, he received breathing treatment but states it only provided minimal relief. He has been having intermittent bouts of SOB for the past few weeks and over the past week or two has also developed a cough productive of brownish sputum.  He has not had any fevers/chills/sweats.  Denies chest pain,  hemoptysis, abd pain, N/V/D, myalgias. No recent travel or exposure to known sick contacts.  Does not smoke; quit "many years ago". CXR obtained in ED suggestive of pulmonary edema and PNA.  TRH requested PCCM admission for concerns of bronchiectasis exacerbation in addition to pulmonary edema (pt received CT scan in March 2015 that demonstrated bronchiectasis).  PAST MEDICAL HISTORY :  Past Medical History  Diagnosis Date  . Chronic diastolic heart failure     secondary to diastolic dysfunction EF (previous EF 35-45%)ef 60%4/09  . Arrhythmia     atrial fibrillation /pt on amiodarone,thought to be poor coumadin  . Stroke   . Other and unspecified hyperlipidemia   . Hypertension   . Hypertrophy of prostate without urinary obstruction and other lower urinary tract symptoms (LUTS)   . Osteoarthrosis, unspecified whether generalized or localized, unspecified site   . Type II or unspecified type diabetes mellitus without mention of complication, not stated as uncontrolled   . AV block, Mobitz 1     Intolerance to ACE's / discontiuation of beta blockers  . Obstructive sleep apnea   . Iron deficiency anemia, unspecified     s/p EGD and coloscopy 3/10.gastritis.hemorrhids  . Cerebrovascular accident   . Renal insufficiency   . Obesity   . Allergic rhinitis, cause unspecified 05/29/2010  . CAD (coronary artery disease)     cath 12/04: mLAD 50-60%, mCFX 20-30%, pRCA 50-60%, mRCA 40%  . Normochromic normocytic anemia 04/21/2013  . Abnormal CXR 04/21/2013  . Hypertensive kidney disease with CKD stage III 04/21/2013  . Prostate cancer    Past Surgical History  Procedure Laterality Date  . Prostate surgery    . Prostate surgury      hx of  . Esophagogastroduodenoscopy N/A 04/21/2013    Procedure: ESOPHAGOGASTRODUODENOSCOPY (EGD);  Surgeon: Irene Shipper, MD;  Location: [facility];  Service: Endoscopy;  Laterality: N/A;   Prior to Admission medications   Medication Sig Start Date End Date Taking?  Authorizing Provider  allopurinol (ZYLOPRIM) 100 MG tablet Take 1 tablet (100 mg total) by mouth at bedtime. 09/08/13  Yes Jolaine Artist, MD  amiodarone (PACERONE) 200 MG tablet Take 0.5 tablets (100 mg total) by mouth every morning. 03/19/13  Yes Shaune Pascal Bensimhon, MD  amLODipine (NORVASC) 10 MG tablet Take 1 tablet (10 mg total) by mouth daily. 02/16/13  Yes Jolaine Artist, MD  aspirin EC 81 MG EC tablet Take 1 tablet (81 mg total) by mouth daily. 04/28/13  Yes Kinnie Feil, MD  atorvastatin (LIPITOR) 40 MG tablet Take 40 mg by mouth daily.   Yes Historical Provider, MD  citalopram (CELEXA) 10 MG tablet Take 20 mg by mouth daily.  07/29/12  Yes Biagio Borg, MD  cloNIDine (CATAPRES - DOSED IN MG/24 HR) 0.1 mg/24hr patch Place 1 patch (0.1 mg total) onto the skin once a week. Changes patch on Thursday's 10/02/13  Yes Jolaine Artist, MD  darbepoetin (ARANESP) 300 MCG/0.6ML SOLN injection Inject 300 mcg into the skin every 14 (fourteen) days.   Yes Historical Provider, MD  Fe Fum-Vit C-Vit B12-FA (TRIGELS-F) 460-60-0.01-1 MG CAPS capsule Take 1 capsule by mouth daily. 02/16/13  Yes Shaune Pascal Bensimhon, MD  Fluticasone-Salmeterol (ADVAIR DISKUS) 100-50 MCG/DOSE AEPB Inhale 1 puff into the lungs 2 (two) times daily. 06/05/12  Yes Renato Shin, MD  insulin detemir (LEVEMIR) 100 UNIT/ML injection Inject 1 mL (100 Units total) into the skin every morning. 09/23/13  Yes Biagio Borg, MD  isosorbide mononitrate (IMDUR) 60 MG 24 hr tablet Take 1 tablet (60 mg total) by mouth daily. 07/27/13  Yes Jolaine Artist, MD  omeprazole (PRILOSEC) 20 MG capsule Take 20 mg by mouth daily. 08/07/13  Yes Shaune Pascal Bensimhon, MD  potassium chloride SA (K-DUR,KLOR-CON) 20 MEQ tablet Take 1 tablet (20 mEq total) by mouth daily. 09/07/13  Yes Biagio Borg, MD  torsemide (DEMADEX) 20 MG tablet Take by mouth 2 (two) times daily. Alternating days. Day 1: Take 40 mg at lunch and 40 mg at dinner.  Day 2: Take 40 mg at lunch  and 30 mg at dinner.  Repeat. 05/22/13  Yes Biagio Borg, MD   Allergies  Allergen Reactions  . Ace Inhibitors Other (See Comments)    me  hyposion  . Beta Adrenergic Blockers Other (See Comments)     use cautiously secondary to 2nd degree heart block, hypotension    FAMILY HISTORY:  Family History  Problem Relation Age of Onset  . Heart disease Father   . Dementia Mother   . CAD Sister    SOCIAL HISTORY:  reports that he has quit smoking. He has never used smokeless tobacco. He reports that he does not drink alcohol or use illicit drugs.  REVIEW OF SYSTEMS:   All negative; except for those that are bolded, which indicate positives.  Constitutional: weight loss, weight gain, night sweats, fevers, chills, fatigue, weakness.  HEENT: headaches, sore throat, sneezing, nasal congestion, post nasal drip, difficulty swallowing, tooth/dental problems, visual complaints, visual changes, ear aches. Neuro: difficulty with speech, weakness, numbness, ataxia. CV:  chest pain,  orthopnea, PND, swelling in lower extremities, dizziness, palpitations, syncope.  Resp: cough, hemoptysis, dyspnea, wheezing. GI  heartburn, indigestion, abdominal pain, nausea, vomiting, diarrhea, constipation, change in bowel habits, loss of appetite, hematemesis, melena, hematochezia.  GU: dysuria, change in color of urine, urgency or frequency, flank pain, hematuria. MSK: joint pain or swelling, decreased range of motion. Psych: change in mood or affect, depression, anxiety, suicidal ideations, homicidal ideations. Skin: rash, itching, bruising.   SUBJECTIVE:   VITAL SIGNS: Temp:  [97.1 F (36.2 C)-98.5 F (36.9 C)] 97.7 F (36.5 C) (09/02 1758) Pulse Rate:  [69-88] 81 (09/02 2022) Resp:  [20-26] 26 (09/02 1758) BP: (126-174)/(52-79) 171/72 mmHg (09/02 2022) SpO2:  [94 %-100 %] 98 % (09/02 2045)  PHYSICAL EXAMINATION: General: Chronically ill appearing male, resting in bed, in NAD. Neuro: A&O x 3,  non-focal.  HEENT: [place]/AT. PERRL, sclerae anicteric. Cardiovascular: RRR, no M/R/G.  Lungs: Respirations shallow, crackles bilaterally.  On 3L O2 via [place]. Abdomen: BS x 4, soft, NT/ND.  Musculoskeletal: No gross deformities, no edema.  Skin: Venous stasis ulcers on b/l LE's.    Recent Labs Lab 10/21/13 1034 10/21/13 2012  NA 142 141  K 4.1 4.2  CL  --  104  CO2 22 20  BUN 45.8* 45*  CREATININE 2.3* 2.08*  GLUCOSE 101 173*    Recent Labs Lab 10/21/13 1031 10/21/13 2012  HGB 7.5* 10.7*  HCT 25.4* 36.4*  WBC 5.2 5.2  PLT 402* 356   Dg Chest 2 View  10/21/2013   CLINICAL DATA:  Shortness of breath  EXAM: CHEST  2 VIEW  COMPARISON:  04/20/2013  FINDINGS: Right upper lobe pneumonia. In the lateral projection there is also basilar consolidation, also likely on the right. Cardiomegaly with pulmonary venous congestion and diffuse interstitial opacity. Suspect small pleural effusions. No pneumothorax.  IMPRESSION: 1. Right-sided pneumonia. 2. Cardiomegaly and pulmonary edema.   Electronically Signed   By: Jorje Guild M.D.   On: 10/21/2013 20:09   Ct Chest Wo Contrast  10/22/2013   CLINICAL DATA:  Shortness of breath, history of prostate cancer  EXAM: CT CHEST WITHOUT CONTRAST  TECHNIQUE: Multidetector CT imaging of the chest was performed following the standard protocol without IV contrast.  COMPARISON:  10/21/2013 chest radiograph, 04/22/2013 chest CT  FINDINGS: Scattered atherosclerotic disease of the aorta and branch vessels without aneurysmal dilatation.  Cardiomegaly. Coronary artery and aortic valvular calcifications. No pericardial effusion.  Small bilateral pleural effusions. Persistent mediastinal adenopathy. As index right infra carinal measuring 2.4 cm short axis on image 29 series 2, 2.2 cm on the prior. Right hilar nodes also appear prominent but difficult to separate from adjacent vessels.  Small hiatal hernia.  Upper abdominal images show nothing acute.  Central airways show  some mucous/secretions along the right wall of the trachea. Left lower lobe mild bronchial narrowing.  Centrilobular emphysema. Increased interstitial and hazy airspace opacities along the periphery of the right greater than left lung and lower lobes.  Multifocal sclerotic lesions.  IMPRESSION: Small pleural effusions. Interstitial and hazy airspace opacities may reflect pulmonary edema or multifocal pneumonia superimposed on COPD.  Prominent mediastinal lymph nodes persist. Sclerotic metastases again noted.  Cardiomegaly.   Electronically Signed   By: Carlos Levering M.D.   On: 10/22/2013 00:19    ASSESSMENT / PLAN:  Pulmonary Edema Bronchiectasis with possible mild flair / exacerbation Emphysema - 3L O2 dependent nocturnally Mild pulm fibrosis Bilateral small pleural effusions OSA Recs: Cont supplemental O2 to maintain SpO2 >  92. Cont lasix per primary. Cont Vanc / Zosyn. Obtain sputum culture, AFB for NTM. DuoNebs scheduled / Albuterol PRN. Cont Dulera, guaifenesin. Pulmonary toiletry.  Rest per primary.   Montey Hora, [facility] Pgr: [phone]  or [phone]   Patient seen and examined with PA R. Shearon Stalls.  Lab, images, and vitals reviewed. Agree with PA R. Desai assessment and plan with the following exceptions:  1. Respiratory distress- secondary to fluid overload - other contributing factors include, anemia of chronic disease, OSA, emphysmea\COPD - cont with management as stated above - this may have cause some bronchospams, therefore placed on scheduled bronchodilators for the next 2 days - monitor fluid balance (try to keep equal) - follow up sputum cultures - cont with vanc\zosyn - cont with Dulera.    Patient with great improvement today, respiratory wise.   Initial Consult Time = 56mins  Vilinda Boehringer, MD [facility] Pager [phone] On Call Pager [phone]

## 2013-10-22 NOTE — Telephone Encounter (Signed)
F/U call to daughter, [facility] for her to call office.

## 2013-10-22 NOTE — Progress Notes (Signed)
ANTIBIOTIC CONSULT NOTE - INITIAL  Pharmacy Consult for Zosyn and Vancomycin Indication: HCAP  Allergies  Allergen Reactions  . Ace Inhibitors Other (See Comments)    me  hyposion  . Beta Adrenergic Blockers Other (See Comments)     use cautiously secondary to 2nd degree heart block, hypotension    Patient Measurements: Height: 5' 10.5" (179.1 cm) Weight: 238 lb 12.1 oz (108.3 kg) IBW/kg (Calculated) : 74.15   Vital Signs: Temp: 98.3 F (36.8 C) (09/03 0053) Temp src: Oral (09/03 0053) BP: 167/67 mmHg (09/03 0053) Pulse Rate: 89 (09/03 0053) Intake/Output from previous day:   Intake/Output from this shift:    Labs:  Recent Labs  10/21/13 1031 10/21/13 1034 10/21/13 2012  WBC 5.2  --  5.2  HGB 7.5*  --  10.7*  PLT 402*  --  356  CREATININE  --  2.3* 2.08*   Estimated Creatinine Clearance: 35.8 ml/min (by C-G formula based on Cr of 2.08). No results found for this basename: VANCOTROUGH, VANCOPEAK, VANCORANDOM, GENTTROUGH, GENTPEAK, GENTRANDOM, TOBRATROUGH, TOBRAPEAK, TOBRARND, AMIKACINPEAK, AMIKACINTROU, AMIKACIN,  in the last 72 hours   Microbiology: No results found for this or any previous visit (from the past 720 hour(s)).  Medical History: Past Medical History  Diagnosis Date  . Chronic diastolic heart failure     secondary to diastolic dysfunction EF (previous EF 35-45%)ef 60%4/09  . Arrhythmia     atrial fibrillation /pt on amiodarone,thought to be poor coumadin  . Stroke   . Other and unspecified hyperlipidemia   . Hypertension   . Hypertrophy of prostate without urinary obstruction and other lower urinary tract symptoms (LUTS)   . Osteoarthrosis, unspecified whether generalized or localized, unspecified site   . Type II or unspecified type diabetes mellitus without mention of complication, not stated as uncontrolled   . AV block, Mobitz 1     Intolerance to ACE's / discontiuation of beta blockers  . Obstructive sleep apnea   . Iron deficiency  anemia, unspecified     s/p EGD and coloscopy 3/10.gastritis.hemorrhids  . Cerebrovascular accident   . Renal insufficiency   . Obesity   . Allergic rhinitis, cause unspecified 05/29/2010  . CAD (coronary artery disease)     cath 12/04: mLAD 50-60%, mCFX 20-30%, pRCA 50-60%, mRCA 40%  . Normochromic normocytic anemia 04/21/2013  . Abnormal CXR 04/21/2013  . Hypertensive kidney disease with CKD stage III 04/21/2013  . Prostate cancer     Medications:  Scheduled:  . allopurinol  100 mg Oral QHS  . amiodarone  100 mg Oral q morning - 10a  . amLODipine  10 mg Oral Daily  . aspirin EC  81 mg Oral Daily  . atorvastatin  40 mg Oral Daily  . citalopram  20 mg Oral Daily  . [START ON 10/28/2013] cloNIDine  0.1 mg Transdermal Weekly  . furosemide  40 mg Intravenous BID  . guaiFENesin  600 mg Oral BID  . insulin aspart  0-5 Units Subcutaneous QHS  . insulin aspart  0-9 Units Subcutaneous TID WC  . insulin detemir  50 Units Subcutaneous Daily  . ipratropium  0.5 mg Nebulization Q6H  . isosorbide mononitrate  60 mg Oral Daily  . mometasone-formoterol  2 puff Inhalation BID  . pantoprazole  40 mg Oral Daily  . pantoprazole (PROTONIX) IV  40 mg Intravenous QHS  . piperacillin-tazobactam  3.375 g Intravenous Once  . piperacillin-tazobactam (ZOSYN)  IV  3.375 g Intravenous Q8H  . sodium chloride  3  mL Intravenous Q12H  . sodium chloride  3 mL Intravenous Q12H  . vancomycin  2,000 mg Intravenous Once   Infusions:   Assessment: 5 yoM with multiple co-morbidities, presented to [facility] ED 9/2 with acute SOB after he received a blood transfusion at cancer center earlier that day (receives recurrent transfusions for anemia due to combo of chronic disease, myelodysplastic disorder, and iron deficiency anemia). In ED, given breathing treatment with little relief. CXR and CT chest suggestive of RLL PNA. Zosyn and Vancomycin per Rx for PNA.   Goal of Therapy:  Vancomycin trough level 15-20 mcg/ml  Plan:    Zosyn 3.375 Gm IV q8h  EI  Vancomycin 2000mg  x1 then 1500mg  IV q48h  F/u SCr/levels/cultures as needed  Lawana Pai R 10/22/2013,1:13 AM

## 2013-10-22 NOTE — Telephone Encounter (Signed)
He is not scheduled for a spinal tap, he scheduled for a bone scan that can be done while he is the hospital. I have discussed the results of the bone marrow with the patient and the daughter and they should stick to what we discussed at that time.

## 2013-10-22 NOTE — Evaluation (Signed)
Physical Therapy Evaluation Patient Details Name: [name] MRN: [id number] DOB: 04/23/1932 Today's Date: 10/22/2013   History of Present Illness  80 year old male patient with history of chronic diastolic CHF,  Afib, CAD, COPD CVA, HLD, HTN, type II DM, OSA, CKD, prostate cancer status post prostatectomy-sclerotic lesion at the spine being evaluated OP for metastasis, multifactorial anemia (renal disease, iron deficiency and possible MDS)-getting 2 weekly PRBC transfusion, Aranesp shots and iron supplements, chronic respiratory failure on nightly oxygen, admitted for acute on chronic respiratory failure secondary to decompensated CHF and possible HCAP.  Clinical Impression  Pt currently with functional limitations due to the deficits listed below (see PT Problem List).  Pt will benefit from skilled PT to increase their independence and safety with mobility to allow discharge to the venue listed below.  Pt reports increased fatigue, weakness and SOB however willing to transfer to recliner today as he typically sleeps in recliner at home.  Pt lives alone and reports declining function recently so recommend SNF at this time.     Follow Up Recommendations SNF;Supervision/Assistance - 24 hour    Equipment Recommendations  None recommended by PT    Recommendations for Other Services       Precautions / Restrictions Precautions Precautions: Fall      Mobility  Bed Mobility Overal bed mobility: Needs Assistance;+2 for physical assistance Bed Mobility: Supine to Sit     Supine to sit: +2 for physical assistance;Mod assist     General bed mobility comments: verbal cues for technique  Transfers Overall transfer level: Needs assistance Equipment used: 2 person hand held assist Transfers: Sit to/from [employer] Transfers Sit to Stand: Mod assist;+2 physical assistance Stand pivot transfers: Mod assist;+2 physical assistance       General transfer comment: verbal cues for  safe technique, pt agreeable to transfer to recliner however declined w/c transfers/mobility today  Ambulation/Gait                Stairs            Wheelchair Mobility    Modified Rankin (Stroke Patients Only)       Balance                                             Pertinent Vitals/Pain Pain Assessment: No/denies pain    Home Living Family/patient expects to be discharged to:: Private residence Living Arrangements: Alone   Type of Home: Apartment Home Access: Ramped entrance     [place]: One level [place]: Wheelchair - power;Walker - 4 wheels      Prior Function Level of Independence: Independent with assistive device(s)         Comments: pt reports ambulating with RW and using w/c at home however states w/c more often now due to fatigue, weakness, and SOB     Hand Dominance        Extremity/Trunk Assessment               Lower Extremity Assessment: Generalized weakness         Communication   Communication: No difficulties  Cognition Arousal/Alertness: Awake/alert Behavior During Therapy: WFL for tasks assessed/performed Overall Cognitive Status: Within Functional Limits for tasks assessed                      General Comments  Exercises        Assessment/Plan    PT Assessment Patient needs continued PT services  PT Diagnosis Difficulty walking   PT Problem List Decreased strength;Decreased activity tolerance;Decreased mobility;Cardiopulmonary status limiting activity;Obesity  PT Treatment Interventions DME instruction;Gait training;Functional mobility training;Therapeutic activities;Therapeutic exercise;Patient/family education   PT Goals (Current goals can be found in the Care Plan section) Acute Rehab PT Goals PT Goal Formulation: With patient Time For Goal Achievement: 10/29/13 Potential to Achieve Goals: Good    Frequency Min 3X/week   Barriers to discharge         Co-evaluation               End of Session Equipment Utilized During Treatment: Oxygen Activity Tolerance: Patient limited by fatigue Patient left: in chair;with call bell/phone within reach;with family/visitor present Nurse Communication: Mobility status         Time: 1771-1657 PT Time Calculation (min): 14 min   Charges:   PT Evaluation $Initial PT Evaluation Tier I: 1 Procedure PT Treatments $Therapeutic Activity: 8-22 mins   PT G Codes:          Woodrow Drab,KATHrine E 10/22/2013, 2:40 PM Carmelia Bake, PT, DPT 10/22/2013 Pager: [phone]

## 2013-10-22 NOTE — Progress Notes (Signed)
Echocardiogram 2D Echocardiogram has been performed.  [name] 10/22/2013, 10:59 AM

## 2013-10-22 NOTE — Progress Notes (Signed)
Came to visit patient at bedside to offer and explain [facility] Care Management services. Patient asks that writer calls his daughter as she "takes care of everything" per patient. Called daughter Virgil Benedict at [phone] who states she does take care of all of patient's needs regarding medication management, compliance, MD appointments, transportation, and the like. States she is more interested in aide services for patient. Made her aware that this is an out of pocket expense. She states [facility] Care Management is not needed at this time. Left brochure at bedside nonetheless. Spoke with inpatient RNCM about conversation with patient and daughter.  Marthenia Rolling, MSN- [facility] [phone]

## 2013-10-22 NOTE — Progress Notes (Signed)
CARE MANAGEMENT NOTE 10/22/2013  Patient:  [name], [name]   Account Number:  [id number]  Date Initiated:  10/22/2013  Documentation initiated by:  Gabriel Earing  Subjective/Objective Assessment:   pt admitted with Prostate CA     Action/Plan:   home/alone   Anticipated DC Date:  10/24/2013   Anticipated DC Plan:  [place]  CM consult      Choice offered to / List presented to:  C-1 Patient        [place] arranged  HH-1 RN      [facility] agency  [facility]   Status of service:  In process, will continue to follow Medicare Important Message given?  NO (If response is "NO", the following Medicare IM given date fields will be blank) Date Medicare IM given:  10/22/2013 Medicare IM given by:  Gabriel Earing Date Additional Medicare IM given:   Additional Medicare IM given by:    Discharge Disposition:    Per UR Regulation:  Reviewed for med. necessity/level of care/duration of stay  If discussed at [place] of Stay Meetings, dates discussed:    Comments:  10/22/13 MMcGibboney, RN, BSN Spoke with HCPOA/[name] pt's daughter([phone]) concerning Home Health needs. She had no preference, [facility] was selected. [name] also asked that PT notes be faxed to [facility] attn:Heather fax [phone] for pt's WC. Will need orders for HHRN/PT/OT/NA, if MD agrees.

## 2013-10-22 NOTE — Telephone Encounter (Signed)
Received call from Beth/[facility] asking about this pt & reason for transfer to ED.  ED reported transfusion reaction.  Per Lacie/RN with pt yest, NP Selena Lesser did not think this was a transfusion reaction & thought he had pneumonia.  Beth asked for Blood tag & Blood bag if retrievable.  These were found & taken to [facility].

## 2013-10-22 NOTE — Progress Notes (Signed)
[facility]   Chief Complaint  Patient presents with  . Shortness of Breath    HPI: [name] 80 y.o. male diagnosed with multifactorial anemia and a remote history of prostate cancer.  Patient underwent a prostatectomy approximately 15 years ago.  He has a questionable sclerotic lesions to the spine; and is scheduled for a body scan on 11/03/2013.  Patient has been undergoing Aranesp injections on an every two-week basis.  He has required blood transfusions almost every 2 weeks as well do to his low hemoglobin.  Patient was just finishing his second unit of blood at this afternoon; and developed a worsening congested cough, shortness of breath.  The patient states he has been dealing with upper respiratory infection for proximally one week.  He states that he has had a productive cough of thick, brown sputum.  He denies any recent fever or chills.  He does typically wear O2 via nasal cannula only at night.  The final of 50 mL of blood transfusion were held; and patient was placed on O2 via nasal cannula.  Patient was then transported to the emergency department for further evaluation and to rule out pneumonia versus CHF versus fluid overload.   Shortness of Breath    CURRENT THERAPY: No active treatment plan for patient.   Review of Systems  Respiratory: Positive for shortness of breath.     Past Medical History  Diagnosis Date  . Chronic diastolic heart failure     secondary to diastolic dysfunction EF (previous EF 35-45%)ef 60%4/09  . Arrhythmia     atrial fibrillation /pt on amiodarone,thought to be poor coumadin  . Stroke   . Other and unspecified hyperlipidemia   . Hypertension   . Hypertrophy of prostate without urinary obstruction and other lower urinary tract symptoms (LUTS)   . Osteoarthrosis, unspecified whether generalized or localized, unspecified site   . Type II or unspecified type diabetes mellitus without mention of complication, not  stated as uncontrolled   . AV block, Mobitz 1     Intolerance to ACE's / discontiuation of beta blockers  . Obstructive sleep apnea   . Iron deficiency anemia, unspecified     s/p EGD and coloscopy 3/10.gastritis.hemorrhids  . Cerebrovascular accident   . Renal insufficiency   . Obesity   . Allergic rhinitis, cause unspecified 05/29/2010  . CAD (coronary artery disease)     cath 12/04: mLAD 50-60%, mCFX 20-30%, pRCA 50-60%, mRCA 40%  . Normochromic normocytic anemia 04/21/2013  . Abnormal CXR 04/21/2013  . Hypertensive kidney disease with CKD stage III 04/21/2013  . Prostate cancer     Past Surgical History  Procedure Laterality Date  . Prostate surgery    . Prostate surgury      hx of  . Esophagogastroduodenoscopy N/A 04/21/2013    Procedure: ESOPHAGOGASTRODUODENOSCOPY (EGD);  Surgeon: Irene Shipper, MD;  Location: [facility];  Service: Endoscopy;  Laterality: N/A;    has HYPERTHYROIDISM; DIABETES MELLITUS, TYPE II, UNCONTROLLED; HYPERLIPIDEMIA; GOUT; ANXIETY; OBSTRUCTIVE SLEEP APNEA; PERIPHERAL NEUROPATHY; HYPERTENSION; CORONARY ARTERY DISEASE; Other second degree atrioventricular block; Atrial fibrillation; Chronic diastolic heart failure; PEPTIC ULCER DISEASE; CHRONIC KIDNEY DISEASE STAGE III (MODERATE); BENIGN PROSTATIC HYPERTROPHY; OSTEOARTHRITIS; INSOMNIA; ABNORMAL EKG; PROSTATE CANCER, HX OF; ALCOHOL ABUSE, EPISODIC, HX OF; CEREBROVASCULAR ACCIDENT, HX OF; COLONIC POLYPS, HX OF; Preventative health care; Allergic rhinitis, cause unspecified; Cervical radiculitis; Epistaxis; Hyperkalemia; Cough; Pneumonia; Edema; Hilar adenopathy; Bilateral hearing loss; Laceration of left lower extremity; Stasis ulcer of left ankle; Nocturnal  hypoxemia; Cellulitis of leg, left; Open wound of left lower leg; Anemia; Normochromic normocytic anemia; Abnormal CXR; Hypertensive kidney disease with CKD stage III; Abnormal bone radiograph; Anemia in chronic kidney disease(285.21); Prostate cancer; Chronic  respiratory failure; Mobility impaired; Acute on chronic diastolic CHF (congestive heart failure), NYHA class 1; Bronchiectasis with acute exacerbation; Secondary malignant neoplasm of bone and bone marrow; and Shortness of breath on his problem list.     is allergic to ace inhibitors and beta adrenergic blockers.    Medication List       This list is accurate as of: 10/21/13  5:50 PM.  Always use your most recent med list.               allopurinol 100 MG tablet  Commonly known as:  ZYLOPRIM  Take 1 tablet (100 mg total) by mouth at bedtime.     amiodarone 200 MG tablet  Commonly known as:  PACERONE  Take 0.5 tablets (100 mg total) by mouth every morning.     amLODipine 10 MG tablet  Commonly known as:  NORVASC  Take 1 tablet (10 mg total) by mouth daily.     aspirin 81 MG EC tablet  Take 1 tablet (81 mg total) by mouth daily.     atorvastatin 40 MG tablet  Commonly known as:  LIPITOR  Take 40 mg by mouth daily.     citalopram 10 MG tablet  Commonly known as:  CELEXA  Take 20 mg by mouth daily.     cloNIDine 0.1 mg/24hr patch  Commonly known as:  CATAPRES - Dosed in mg/24 hr  Place 1 patch (0.1 mg total) onto the skin once a week. Changes patch on Thursday's     darbepoetin 300 MCG/0.6ML Soln injection  Commonly known as:  ARANESP  Inject 300 mcg into the skin every 14 (fourteen) days.     Fe Fum-Vit C-Vit B12-FA 460-60-0.01-1 MG Caps capsule  Commonly known as:  TRIGELS-F  Take 1 capsule by mouth daily.     Fluticasone-Salmeterol 100-50 MCG/DOSE Aepb  Commonly known as:  ADVAIR DISKUS  Inhale 1 puff into the lungs 2 (two) times daily.     insulin detemir 100 UNIT/ML injection  Commonly known as:  LEVEMIR  Inject 1 mL (100 Units total) into the skin every morning.     isosorbide mononitrate 60 MG 24 hr tablet  Commonly known as:  IMDUR  Take 1 tablet (60 mg total) by mouth daily.     omeprazole 20 MG capsule  Commonly known as:  PRILOSEC  Take 2 capsules  (40 mg total) by mouth daily.     omeprazole 20 MG capsule  Commonly known as:  PRILOSEC  Take 20 mg by mouth daily.     potassium chloride SA 20 MEQ tablet  Commonly known as:  K-DUR,KLOR-CON  Take 1 tablet (20 mEq total) by mouth daily.     SYRINGE/NEEDLE (DISP) 1 ML 25G X 5/8" 1 ML Misc  Commonly known as:  B-D SYRINGE/NEEDLE 1CC/25GX5/8  1 Syringe by Does not apply route every morning.     torsemide 20 MG tablet  Commonly known as:  DEMADEX  Take by mouth 2 (two) times daily. Alternating days. Day 1: Take 40 mg at lunch and 40 mg at dinner.  Day 2: Take 40 mg at lunch and 30 mg at dinner.  Repeat.         PHYSICAL EXAMINATION  Vitals: BP 156/67, HR 85, RR 18, O2 sat  99%, temp 98.9  Physical Exam  Nursing note and vitals reviewed. Constitutional: He is oriented to person, place, and time and well-developed, well-nourished, and in no distress. No distress.  HENT:  Head: Normocephalic and atraumatic.  Eyes: Conjunctivae are normal. Pupils are equal, round, and reactive to light.  Neck: Normal range of motion. Neck supple.  Cardiovascular: Normal rate, regular rhythm, normal heart sounds and intact distal pulses.   Pulmonary/Chest: No respiratory distress. He has wheezes. He has rales. He exhibits no tenderness.  Patient noted to be audibly wheezing.  Has congested cough.  No acute respiratory distress noted.  Abdominal: Soft. Bowel sounds are normal. He exhibits no distension. There is no tenderness. There is no rebound.  Musculoskeletal: Normal range of motion. He exhibits edema. He exhibits no tenderness.  +1 edema to bilateral ankles noted.  Lymphadenopathy:    He has no cervical adenopathy.  Neurological: He is alert and oriented to person, place, and time.  Patient typically uses either a walker or a wheelchair at home.  Skin: Skin is warm and dry. No rash noted. No erythema.  Psychiatric: Affect normal.    LABORATORY DATA:. CBC  Lab Results  Component Value  Date   WBC 8.3 10/22/2013   RBC 3.53* 10/22/2013   HGB 9.3* 10/22/2013   HCT 32.0* 10/22/2013   PLT 379 10/22/2013   MCV 90.7 10/22/2013   MCH 26.3 10/22/2013   MCHC 29.1* 10/22/2013   RDW 16.3* 10/22/2013   LYMPHSABS 0.3* 10/22/2013   MONOABS 0.5 10/22/2013   EOSABS 0.4 10/22/2013   BASOSABS 0.0 10/22/2013     CMET  Lab Results  Component Value Date   NA 145 10/22/2013   K 4.0 10/22/2013   CL 108 10/22/2013   CO2 21 10/22/2013   GLUCOSE 192* 10/22/2013   BUN 43* 10/22/2013   CREATININE 2.04* 10/22/2013   CALCIUM 9.3 10/22/2013   PROT 7.2 10/22/2013   ALBUMIN 2.6* 10/22/2013   AST 14 10/22/2013   ALT 13 10/22/2013   ALKPHOS 75 10/22/2013   BILITOT 0.3 10/22/2013   GFRNONAA 29* 10/22/2013   GFRAA 34* 10/22/2013      RADIOGRAPHIC STUDIES:  ASSESSMENT/PLAN:    PROSTATE CANCER, HX OF  Assessment & Plan Patient has a known history of prostate cancer; and underwent a prostatectomy approximate 15 years ago.  He does now have questionable sclerotic lesions to the spine; with a PSA that is slightly elevated.  The plan is to restage patient with a whole-body scan on 11/03/2013.   Anemia  Assessment & Plan Multifactorial anemia-the patient hasn't element of anemia of renal disease; as well as iron deficiency anemia the patient underwent a bone marrow biopsy on 09/09/2013 of which did reveal possible myelodysplastic syndrome.  Patient has been receiving packed red blood cell transfusions approximately every 2 weeks.  He is also receiving Aranesp injections on an every two-week basis as well.  He will also continue his iron supplementation as well.   Secondary malignant neoplasm of bone and bone marrow  Assessment & Plan Patient has a questionable sclerotic lesions to the spine.  Patient is scheduled for a whole body scan on 11/03/2013 to fully up evaluate.   Shortness of breath  Assessment & Plan Patient has a history of chronic COPD and CHF.  He does take a diuretic every morning; and every other evening.  He states  that he has been having some URI symptoms for the past week or so.  He complains of a productive  cough of a dark brown sputum.  He denies any recent fevers or chills.  He does not typically wear O2 per nasal cannula at night only.  Patient was finishing up his second unit of blood this afternoon; and developed a worsening in just a cough, and shortness of breath.  O2 was applied at 2 L via nasal cannula.  Patient was then transported to the emergency department for further evaluation of possible pneumonia versus CHF exacerbation versus fluid overload.  A report was called to the emergency department charge nurse as well.   Patient stated understanding of all instructions; and was in agreement with this plan of care. The patient knows to call the clinic with any problems, questions or concerns.   Review/collaboration with [name] regarding all aspects of patient's visit today.   Total time spent with patient was 25 minutes;  with greater than 80 percent of that time spent in face to face counseling regarding his symptoms, arrangement/communication with the emergency department transfer, and coordination of care and follow up.  Disclaimer: This note was dictated with voice recognition software. Similar sounding words can inadvertently be transcribed and may not be corrected upon review.   Drue Second, NP 10/22/2013

## 2013-10-22 NOTE — Telephone Encounter (Signed)
1) Daughter called concerned with conflicting information she has heard/recieved regarding patients dx. States the ED physician state that patient has "cancer in his bone marrow" and she states this conflicts with what [name] sad that patient did not have cancer. She is asking for clarification on this.  2) Daughter also asking since patient is currently admitted, can he have the spinal tap done during his admission instead of sched appt in Sept 15th?  Informed daughter will review with MD and return call.  Mssg routed to MD.  [name], daughter, is on pts ROI.

## 2013-10-23 ENCOUNTER — Telehealth: Payer: Self-pay | Admitting: Medical Oncology

## 2013-10-23 DIAGNOSIS — R0602 Shortness of breath: Secondary | ICD-10-CM

## 2013-10-23 DIAGNOSIS — J984 Other disorders of lung: Secondary | ICD-10-CM

## 2013-10-23 DIAGNOSIS — J309 Allergic rhinitis, unspecified: Secondary | ICD-10-CM

## 2013-10-23 DIAGNOSIS — R05 Cough: Secondary | ICD-10-CM

## 2013-10-23 DIAGNOSIS — N183 Chronic kidney disease, stage 3 unspecified: Secondary | ICD-10-CM

## 2013-10-23 DIAGNOSIS — R059 Cough, unspecified: Secondary | ICD-10-CM

## 2013-10-23 DIAGNOSIS — I129 Hypertensive chronic kidney disease with stage 1 through stage 4 chronic kidney disease, or unspecified chronic kidney disease: Secondary | ICD-10-CM

## 2013-10-23 DIAGNOSIS — R9389 Abnormal findings on diagnostic imaging of other specified body structures: Secondary | ICD-10-CM

## 2013-10-23 DIAGNOSIS — I4729 Other ventricular tachycardia: Secondary | ICD-10-CM

## 2013-10-23 DIAGNOSIS — I472 Ventricular tachycardia: Secondary | ICD-10-CM

## 2013-10-23 DIAGNOSIS — I2510 Atherosclerotic heart disease of native coronary artery without angina pectoris: Secondary | ICD-10-CM

## 2013-10-23 LAB — CBC
HCT: 31.1 % — ABNORMAL LOW (ref 39.0–52.0)
Hemoglobin: 8.9 g/dL — ABNORMAL LOW (ref 13.0–17.0)
MCH: 25.9 pg — ABNORMAL LOW (ref 26.0–34.0)
MCHC: 28.6 g/dL — ABNORMAL LOW (ref 30.0–36.0)
MCV: 90.7 fL (ref 78.0–100.0)
Platelets: 362 K/uL (ref 150–400)
RBC: 3.43 MIL/uL — ABNORMAL LOW (ref 4.22–5.81)
RDW: 16.2 % — ABNORMAL HIGH (ref 11.5–15.5)
WBC: 8 K/uL (ref 4.0–10.5)

## 2013-10-23 LAB — BASIC METABOLIC PANEL
Anion gap: 12 (ref 5–15)
BUN: 34 mg/dL — ABNORMAL HIGH (ref 6–23)
CO2: 24 mEq/L (ref 19–32)
Calcium: 8.8 mg/dL (ref 8.4–10.5)
Chloride: 108 mEq/L (ref 96–112)
Creatinine, Ser: 1.91 mg/dL — ABNORMAL HIGH (ref 0.50–1.35)
GFR calc non Af Amer: 32 mL/min — ABNORMAL LOW (ref >90)
GFR, EST AFRICAN AMERICAN: 37 mL/min — AB (ref >90)
GLUCOSE: 101 mg/dL — AB (ref 70–99)
POTASSIUM: 4 meq/L (ref 3.7–5.3)
SODIUM: 144 meq/L (ref 137–147)

## 2013-10-23 LAB — GLUCOSE, CAPILLARY
GLUCOSE-CAPILLARY: 143 mg/dL — AB (ref 70–99)
Glucose-Capillary: 92 mg/dL (ref 70–99)
Glucose-Capillary: 166 mg/dL — ABNORMAL HIGH (ref 70–99)

## 2013-10-23 LAB — MAGNESIUM: Magnesium: 2.1 mg/dL (ref 1.5–2.5)

## 2013-10-23 MED ORDER — DARBEPOETIN ALFA-POLYSORBATE 300 MCG/0.6ML IJ SOLN
300.0000 ug | Freq: Once | INTRAMUSCULAR | Status: AC
  Administered 2013-10-23: 300 ug via SUBCUTANEOUS
  Filled 2013-10-23: qty 0.6

## 2013-10-23 MED ORDER — LEVOFLOXACIN 750 MG PO TABS
750.0000 mg | ORAL_TABLET | ORAL | Status: DC
  Administered 2013-10-23 – 2013-10-25 (×2): 750 mg via ORAL
  Filled 2013-10-23 (×2): qty 1

## 2013-10-23 NOTE — Progress Notes (Signed)
ANTIBIOTIC CONSULT NOTE - FOLLOW UP  Pharmacy Consult for Levaquin Indication: HCAP  Allergies  Allergen Reactions  . Ace Inhibitors Other (See Comments)    me  hyposion  . Beta Adrenergic Blockers Other (See Comments)     use cautiously secondary to 2nd degree heart block, hypotension    Patient Measurements: Height: 5' 10.5" (179.1 cm) Weight: 232 lb 12.9 oz (105.6 kg) IBW/kg (Calculated) : 74.15  Vital Signs: Temp: 98.5 F (36.9 C) (09/04 0620) Temp src: Oral (09/04 0620) BP: 153/64 mmHg (09/04 0620) Pulse Rate: 83 (09/04 0620) Intake/Output from previous day: 09/03 0701 - 09/04 0700 In: 940 [P.O.:840; IV Piggyback:100] Out: 2620 [Urine:2620]  Labs:  Recent Labs  10/21/13 2012 10/22/13 0112 10/22/13 1239 10/23/13 0405  WBC 5.2 7.0 8.3 8.0  HGB 10.7* 9.6* 9.3* 8.9*  PLT 356 435* 379 362  CREATININE 2.08* 2.04*  --  1.91*   Estimated Creatinine Clearance: 38.5 ml/min (by C-G formula based on Cr of 1.91).  Microbiology: No results found for this or any previous visit (from the past 720 hour(s)).  Anti-infectives: 9/3 >> zosyn >> 9/4 9/3 >> vancomycin >> 9/4 9/4 >> Levaquin >>   Assessment: 77 yoM with multiple co-morbidities, presented to [facility] ED 9/2 with acute SOB after he received a blood transfusion at cancer center earlier that day (receives recurrent transfusions for anemia due to combo of chronic disease, myelodysplastic disorder, and iron deficiency anemia).  CXR and CT chest suggestive of RLL PNA. Zosyn and Vancomycin per Rx for PNA, narrowed to Levaquin for total abx duration of 8 days.  9/4, Day 2/8 total abx, Day 1/7 Levaquin  Tmax: 98.3  WBCs: 8.3  Renal: SCr 1.91, CrCl ~ 38 ml/min   Goal of Therapy:  Appropriate abx dosing, eradication of infection.   Plan:   Levaquin 750mg  PO Q48h  Follow up renal fxn and culture results as available.    Gretta Arab PharmD, BCPS Pager [phone] 10/23/2013 1:00 PM

## 2013-10-23 NOTE — Clinical Documentation Improvement (Signed)
  80 yo black male admitted with acute on chronic diastolic heart failure.  Known history of HTN, Stage 3 CKD.  10/22/13 Echo - moderate LVH, grade 3 diastolic dysfunction.  Cardiomegaly noted on CXR 10/21/13.  Possible Clinical Conditions:   - Hyptertensive Heart Disease   - Other Condition   - Unable to clinically Determine  Thank You, Erling Conte ,RN Clinical Documentation Specialist:  [phone] [facility] [employer] [facility]

## 2013-10-23 NOTE — Consult Note (Addendum)
CARDIOLOGY CONSULT NOTE   Patient ID: [name]  MRN: [id number], DOB/AGE: June 13, 1932  Admit date: 10/21/2013  Date of Consult: 10/23/2013  Primary Physician: [name], MD  Primary Cardiologist: Dr. Haroldine Laws   Pt. Profile  80 year old [place] American male with past medical history of diastolic heart failure, history of first degree heart block and Mobitz type I heart block, hypertension, hyperlipidemia, diabetes, previous CVA, chronic kidney disease stage III, history of paroxysmal atrial fibrillation on amiodarone, however not on Coumadin due to history of EtOH abuse presented with dyspnea after blood transfusion.  Problem List  Past Medical History   Diagnosis  Date   .  Chronic diastolic heart failure      secondary to diastolic dysfunction EF (previous EF 35-45%)ef 60%4/09   .  Arrhythmia      atrial fibrillation /pt on amiodarone,thought to be poor coumadin   .  Stroke    .  Other and unspecified hyperlipidemia    .  Hypertension    .  Hypertrophy of prostate without urinary obstruction and other lower urinary tract symptoms (LUTS)    .  Osteoarthrosis, unspecified whether generalized or localized, unspecified site    .  Type II or unspecified type diabetes mellitus without mention of complication, not stated as uncontrolled    .  AV block, Mobitz 1      Intolerance to ACE's / discontiuation of beta blockers   .  Obstructive sleep apnea    .  Iron deficiency anemia, unspecified      s/p EGD and coloscopy 3/10.gastritis.hemorrhids   .  Cerebrovascular accident    .  Renal insufficiency    .  Obesity    .  Allergic rhinitis, cause unspecified  05/29/2010   .  CAD (coronary artery disease)      cath 12/04: mLAD 50-60%, mCFX 20-30%, pRCA 50-60%, mRCA 40%   .  Normochromic normocytic anemia  04/21/2013   .  Abnormal CXR  04/21/2013   .  Hypertensive kidney disease with CKD stage III  04/21/2013   .  Prostate cancer     Past Surgical History   Procedure  Laterality  Date   .   Prostate surgery     .  Prostate surgury       hx of   .  Esophagogastroduodenoscopy  N/A  04/21/2013     Procedure: ESOPHAGOGASTRODUODENOSCOPY (EGD); Surgeon: Irene Shipper, MD; Location: [facility]; Service: Endoscopy; Laterality: N/A;    Allergies  Allergies   Allergen  Reactions   .  Ace Inhibitors  Other (See Comments)     me hyposion   .  Beta Adrenergic Blockers  Other (See Comments)     use cautiously secondary to 2nd degree heart block, hypotension    HPI  The patient is a 80 year old African American male with past medical history of diastolic heart failure, history of first degree heart block and Mobitz type I heart block, hypertension, hyperlipidemia, diabetes, previous CVA, chronic kidney disease stage III, history of paroxysmal atrial fibrillation on amiodarone, however not on Coumadin due to history of EtOH abuse. Patient has a long-standing history of first degree heart block with Wenckebach tracing back to 2012. He has been following with Dr. Haroldine Laws and heart failure team since his admission for diastolic heart failure in November 2013. His last echo on 01/08/2012 showed EF 55-60%, no regional wall motion abnormality, grade 2 diastolic dysfunction, mild mitral regurg. His last followup was heart failure team was on  04/03/2013, during which visit, his torsemide was increased to 60 mg twice a day along with addition of metolazone for 2 days. Patient was contacted 6 days later on 2/19 due to lab showing worsening renal function, his torsemide was decreased down to 40 mg twice a day with potassium daily.   Since his followup, patient has been doing okay at home. He states he has been started on 3L nighttime home oxygen in recent months. He has intermittent lower extremity edema with blister formation, and sleeps in a recliner at an elevated angle. He has not experienced any significant paroxysmal nocturnal dyspnea. He has never had any chest pain before. He states he has been compliant  with low sodium diet. He measured his weight on a weekly basis, and has been around 230s range. He admits he drinks a lot of water at home. He denies any recent fever, however has been having some chills and cough. According to the patient, he is known to have heart block on telemetry for many years, however denies any significant dizziness or presyncope at home.   Patient has been getting weekly red blood cell transfusion at [facility] due to multifactorial anemia secondary to renal disease, iron deficiency and possible myelodysplastic disorder. Patient was transferred to [facility] ED on 10/21/2013 after developing acute shortness breath after blood transfusion. Initial labs were significant for Cr of 2.08, albumin of 2.6, proBNP of 1541, hemoglobin of 10.7. Initial chest x-ray was concerning for right-sided pneumonia and pulmonary edema. A CT of the chest was obtained which showed interstitial and hazy airspace opacities which may reflect pulmonary edema versus multifocal pneumonia superimposed on COPD, sclerotic metastasis noted. Patient was admitted by internal medicine group, and placed on 40 mg twice a day of IV Lasix with good diuresis. He is currently down 6 pounds from 238 down to 232. And a -2.4 L. Overnight, patient was noted to have several transient bradycardic episodes along with several NSVT with longest one 7 beats, although he denies any symptom of dizziness or presyncope overnight. He has no cardiac awareness of such events. Cardiology was consulted for bradycardia and nonsustained VT.   Inpatient Medications  .  allopurinol  100 mg  Oral  QHS   .  amiodarone  100 mg  Oral  q morning - 10a   .  amLODipine  10 mg  Oral  Daily   .  aspirin EC  81 mg  Oral  Daily   .  atorvastatin  40 mg  Oral  Daily   .  citalopram  20 mg  Oral  Daily   .  [START ON 10/28/2013] cloNIDine  0.1 mg  Transdermal  Weekly   .  furosemide  40 mg  Intravenous  BID   .  guaiFENesin  600 mg  Oral  BID   .   insulin aspart  0-5 Units  Subcutaneous  QHS   .  insulin aspart  0-9 Units  Subcutaneous  TID WC   .  insulin detemir  50 Units  Subcutaneous  Daily   .  ipratropium-albuterol  3 mL  Nebulization  Q6H   .  isosorbide mononitrate  60 mg  Oral  Daily   .  levofloxacin  750 mg  Oral  Q48H   .  mometasone-formoterol  2 puff  Inhalation  BID   .  pantoprazole  40 mg  Oral  Daily   .  silver sulfADIAZINE   Topical  BID  Followed by   .  [START ON 10/25/2013] silver sulfADIAZINE   Topical  Daily   .  sodium chloride  3 mL  Intravenous  Q12H    Family History  Family History   Problem  Relation  Age of Onset   .  Heart disease  Father    .  Dementia  Mother    .  CAD  Sister     Social History  History    Social History   .  Marital Status:  Divorced     Spouse Name:  N/A     Number of Children:  N/A   .  Years of Education:  N/A    Occupational History   .  Not on file.    Social History Main Topics   .  Smoking status:  Former Research scientist (life sciences)   .  Smokeless tobacco:  Never Used   .  Alcohol Use:  No      Comment: history of abuse   .  Drug Use:  No   .  Sexual Activity:  No    Other Topics  Concern   .  Not on file    Social History Narrative    Regular exercise - no ambulates w/cane or walker    Review of Systems  General: No fever, night sweats or weight changes. +chills. Weight decreased since last visit with cardiology team  Cardiovascular: No chest pain, paroxysmal nocturnal dyspnea. +dyspnea on exertion, edema, orthopnea, palpitations.  Dermatological: No rash, lesions/masses  Respiratory: +cough, dyspnea after blood transfusion, no longer having SOB after good diuresis  Urologic: No hematuria, dysuria  Abdominal: No nausea, vomiting, diarrhea  Neurologic: No visual changes, wkns, changes in mental status.  All other systems reviewed and are otherwise negative except as noted above.  Physical Exam  Blood pressure 153/64, pulse 83, temperature 98.5 F (36.9 C),  temperature source Oral, resp. rate 16, height 5' 10.5" (1.791 m), weight 232 lb 12.9 oz (105.6 kg), SpO2 93.00%.  General: Pleasant, NAD  Psych: Normal affect.  Neuro: Alert and oriented X 3. Moves all extremities spontaneously.  HEENT: Normal  Neck: Supple without bruits or JVD.  Lungs: Resp regular and unlabored, decreased breath sound in bilateral bases along with intermittent rale  Heart: RRR no s3, s4. 1/6 systolic murmur  Abdomen: Soft, non-tender, non-distended, BS + x 4. Foley in place  Extremities: No clubbing, cyanosis or edema. DP/PT/Radials 2+ and equal bilaterally.  Labs   Recent Labs   10/21/13 2012  10/22/13 0112  10/22/13 0630  10/22/13 1239   TROPONINI  <0.30  <0.30  <0.30  <0.30    Lab Results   Component  Value  Date    WBC  8.0  10/23/2013    HGB  8.9*  10/23/2013    HCT  31.1*  10/23/2013    MCV  90.7  10/23/2013    PLT  362  10/23/2013    Recent Labs  Lab  10/22/13 0112  10/23/13 0405   NA  145  144   K  4.0  4.0   CL  108  108   CO2  21  24   BUN  43*  34*   CREATININE  2.04*  1.91*   CALCIUM  9.3  8.8   PROT  7.2  --   BILITOT  0.3  --   ALKPHOS  75  --   ALT  13  --   AST  14  --  GLUCOSE  192*  101*    Lab Results   Component  Value  Date    CHOL  110  07/09/2013    HDL  34.70*  07/09/2013    LDLCALC  51  07/09/2013    TRIG  124.0  07/09/2013    Lab Results   Component  Value  Date    DDIMER  3.71*  09/22/2010    Radiology/Studies  Dg Chest 2 View  10/21/2013 CLINICAL DATA: Shortness of breath EXAM: CHEST 2 VIEW COMPARISON: 04/20/2013 FINDINGS: Right upper lobe pneumonia. In the lateral projection there is also basilar consolidation, also likely on the right. Cardiomegaly with pulmonary venous congestion and diffuse interstitial opacity. Suspect small pleural effusions. No pneumothorax. IMPRESSION: 1. Right-sided pneumonia. 2. Cardiomegaly and pulmonary edema. Electronically Signed By: Jorje Guild M.D. On: 10/21/2013 20:09  Dg Abd 1 View   10/22/2013 CLINICAL DATA: Abdominal pain and distention, history coronary artery disease, hypertension, diabetes, chronic diastolic heart failure, stroke, prostate cancer EXAM: ABDOMEN - 1 VIEW COMPARISON: None ; correlation made with radionuclide bone scan 04/24/2013 FINDINGS: Scattered gas and stool in colon. Few nonspecific upper normal caliber small bowel loops in mid abdomen. No definite evidence of obstruction or wall thickening. Slightly increased stool in colon. Sclerotic L2 vertebral body question osseous metastatic disease. Underpenetration limits assessment of remaining osseous structures. Surgical clips in pelvis bilaterally. IMPRESSION: Nonobstructive bowel gas pattern. Slightly prominent stool in rectum. Sclerotic L2 vertebral body suspicious for sclerotic osseous metastatic disease in this patient with a history of prostate cancer ; this corresponds to previously identified scintigraphic abnormality. Electronically Signed By: Lavonia Dana M.D. On: 10/22/2013 10:44  Ct Chest Wo Contrast  10/22/2013 CLINICAL DATA: Shortness of breath, history of prostate cancer EXAM: CT CHEST WITHOUT CONTRAST TECHNIQUE: Multidetector CT imaging of the chest was performed following the standard protocol without IV contrast. COMPARISON: 10/21/2013 chest radiograph, 04/22/2013 chest CT FINDINGS: Scattered atherosclerotic disease of the aorta and branch vessels without aneurysmal dilatation. Cardiomegaly. Coronary artery and aortic valvular calcifications. No pericardial effusion. Small bilateral pleural effusions. Persistent mediastinal adenopathy. As index right infra carinal measuring 2.4 cm short axis on image 29 series 2, 2.2 cm on the prior. Right hilar nodes also appear prominent but difficult to separate from adjacent vessels. Small hiatal hernia. Upper abdominal images show nothing acute. Central airways show some mucous/secretions along the right wall of the trachea. Left lower lobe mild bronchial narrowing.  Centrilobular emphysema. Increased interstitial and hazy airspace opacities along the periphery of the right greater than left lung and lower lobes. Multifocal sclerotic lesions. IMPRESSION: Small pleural effusions. Interstitial and hazy airspace opacities may reflect pulmonary edema or multifocal pneumonia superimposed on COPD. Prominent mediastinal lymph nodes persist. Sclerotic metastases again noted. Cardiomegaly. Electronically Signed By: Carlos Levering M.D. On: 10/22/2013 00:19  ECG  NSR with HR 80s, incomplete LBBB, 1st degree av block  ASSESSMENT AND PLAN  1. Asymptomatic bradycardia  - transient asymptomatic with 1st degree heart block and frequent Wenkebach. Mainly in HR 60s-80s, occasionally dip down to high 30s-40s. Occasional episode of dropped beat on every other beat which may represent more advanced conduction delay, however appears to be rare and transient. Likely no immediate need for intervention (and likely would not be a candidate at this time if has superimposed COPD exacerbation vs pneumonia on top of diastolic HF)  - if patient has any dizziness or presyncopal sx in the future, consider 48 hour Holter monitor and follow up with EP on an outpatient basis  -  Echo 9/3 preserved EF, moderate LVH, grade 3 diastolic HF (worsened since 2013) - follow up with HF team as outpatient  - no AV blocking agent. Need better BP control   2. Acute on chronic diastolic heart failure: likely due to blood transfusion  - although echo shows worsening diastolic dysfunction compare to 2013  - need to control BP and follow up with HF team closely  - can transition back to home Demadex tomorrow  3. COPD vs pneumonia  - on abx   4. history of first degree heart block and Mobitz type I heart block, see #1   5. Hypertension   6. Hyperlipidemia   7. Diabetes   8. previous CVA   9. chronic kidney disease stage III   10. history of paroxysmal atrial fibrillation on amiodarone, however not on  Coumadin due to history of EtOH abuse  - continue low dose amiodarone for now   11. NSVT - continue to monitor  - keep K >4 and Mg >2.0  - continue amiodarone at current dose, may need to increase if having frequent episodes however it may worsen wenkebach   12. Multifactorial anemia  Signed,  Almyra Deforest, PA-C  10/23/2013, 1:51 PM   Patient seen and examined  I agree with findings H Meng above. Asked to see re bradycardia.  On review patient has some bradycardia with HR to 30s at times  Review of monitor strips shows SR with Type I second degree AV block  BP has been adequate.   WOuld follow  I would recomm keeping on low dose amiodarone for now Patient has diuresed some from admit  Would continue with IV lasix through today.   WIll make sure patient has f/u appt in clinic .   I have made appt with CHF clinic Monday Sept 14 at 10:45   Dorris Carnes

## 2013-10-23 NOTE — Progress Notes (Signed)
Confirmed with HCPOA/[name] [phone]) pt's daughter, pt will discharge home, SNF is declined.

## 2013-10-23 NOTE — Progress Notes (Signed)
  Saved as wrong note type, ignore. Please see consult today

## 2013-10-23 NOTE — Progress Notes (Signed)
Assumed care of patient. No changes in initial am assessment. Cont with plan of care

## 2013-10-23 NOTE — Progress Notes (Signed)
Pt HR dropped to 38 and tele strip showed a 2nd degree heart block. Notified NP on call, performed EKG. Pt c/o of no pain and not in distress. Will continue to monitor pt.

## 2013-10-23 NOTE — Consult Note (Signed)
Name: ESTILL LLERENA MRN: [id number] DOB: Jul 12, 1932    ADMISSION DATE:  10/21/2013 CONSULTATION DATE:  10/21/2013  REFERRING MD :  Roel Cluck PRIMARY SERVICE:  TR[facility]  CHIEF COMPLAINT:  SOB  BRIEF PATIENT DESCRIPTION: 80 y.o. M with multiple co-morbidities, presented to [facility] ED 9/2 with acute SOB after he received a blood transfusion at cancer center earlier that day (receives recurrent transfusions for anemia due to combo of chronic disease, myelodysplastic disorder, and iron deficiency anemia).  In ED, given breathing treatment with little relief.  CXR and CT chest suggestive of RLL PNA.  PCCM consulted and following along.   SIGNIFICANT EVENTS / STUDIES:  9/2 - received blood transfusion at cancer center.  Later became acutely SOB prompting him to come to ED. 9/2 CT Chest >>> Small b/l pleural effusions, Interstitial and hazy airspace opacities may reflect pulmonary edema or multifocal pneumonia superimposed on COPD. Prominent mediastinal lymph nodes. Sclerotic metastases again noted. Cardiomegaly.   LINES / TUBES: PIV  CULTURES: Sputum 9/3 >>> U. Strep 9/3 >>> U. Legionella 9/3 >>>  ANTIBIOTICS: Vanc 9/3 >>> Zosyn 9/3 >>>  HISTORY OF PRESENT ILLNESS:  Mr. Beckel is a 80 y.o. M with multiple co-morbidities who presented to [facility] ED on 9/2 with SOB that he developed following a blood transfusion at the cancer center earlier that day. He has recurrent transfusions for anemia due to a combination of chronic disease, myelodysplastic disorder, and iron deficiency.  He is followed by Dr. Clydene Laming.  He tells me that he received transfusion and shortly thereafter, became SOB and "really congested".  Since he arrived in ED, he received breathing treatment but states it only provided minimal relief. He has been having intermittent bouts of SOB for the past few weeks and over the past week or two has also developed a cough productive of brownish sputum.  He has not had any fevers/chills/sweats.  Denies  chest pain, hemoptysis, abd pain, N/V/D, myalgias. No recent travel or exposure to known sick contacts.  Does not smoke; quit "many years ago". CXR obtained in ED suggestive of pulmonary edema and PNA.  TRH requested PCCM admission for concerns of bronchiectasis exacerbation in addition to pulmonary edema (pt received CT scan in March 2015 that demonstrated bronchiectasis).  PAST MEDICAL HISTORY :  Past Medical History  Diagnosis Date  . Chronic diastolic heart failure     secondary to diastolic dysfunction EF (previous EF 35-45%)ef 60%4/09  . Arrhythmia     atrial fibrillation /pt on amiodarone,thought to be poor coumadin  . Stroke   . Other and unspecified hyperlipidemia   . Hypertension   . Hypertrophy of prostate without urinary obstruction and other lower urinary tract symptoms (LUTS)   . Osteoarthrosis, unspecified whether generalized or localized, unspecified site   . Type II or unspecified type diabetes mellitus without mention of complication, not stated as uncontrolled   . AV block, Mobitz 1     Intolerance to ACE's / discontiuation of beta blockers  . Obstructive sleep apnea   . Iron deficiency anemia, unspecified     s/p EGD and coloscopy 3/10.gastritis.hemorrhids  . Cerebrovascular accident   . Renal insufficiency   . Obesity   . Allergic rhinitis, cause unspecified 05/29/2010  . CAD (coronary artery disease)     cath 12/04: mLAD 50-60%, mCFX 20-30%, pRCA 50-60%, mRCA 40%  . Normochromic normocytic anemia 04/21/2013  . Abnormal CXR 04/21/2013  . Hypertensive kidney disease with CKD stage III 04/21/2013  . Prostate cancer  Past Surgical History  Procedure Laterality Date  . Prostate surgery    . Prostate surgury      hx of  . Esophagogastroduodenoscopy N/A 04/21/2013    Procedure: ESOPHAGOGASTRODUODENOSCOPY (EGD);  Surgeon: Irene Shipper, MD;  Location: [facility];  Service: Endoscopy;  Laterality: N/A;   Prior to Admission medications   Medication Sig Start Date End  Date Taking? Authorizing Provider  allopurinol (ZYLOPRIM) 100 MG tablet Take 1 tablet (100 mg total) by mouth at bedtime. 09/08/13  Yes Jolaine Artist, MD  amiodarone (PACERONE) 200 MG tablet Take 0.5 tablets (100 mg total) by mouth every morning. 03/19/13  Yes Shaune Pascal Bensimhon, MD  amLODipine (NORVASC) 10 MG tablet Take 1 tablet (10 mg total) by mouth daily. 02/16/13  Yes Jolaine Artist, MD  aspirin EC 81 MG EC tablet Take 1 tablet (81 mg total) by mouth daily. 04/28/13  Yes Kinnie Feil, MD  atorvastatin (LIPITOR) 40 MG tablet Take 40 mg by mouth daily.   Yes Historical Provider, MD  citalopram (CELEXA) 10 MG tablet Take 20 mg by mouth daily.  07/29/12  Yes Biagio Borg, MD  cloNIDine (CATAPRES - DOSED IN MG/24 HR) 0.1 mg/24hr patch Place 1 patch (0.1 mg total) onto the skin once a week. Changes patch on Thursday's 10/02/13  Yes Jolaine Artist, MD  darbepoetin (ARANESP) 300 MCG/0.6ML SOLN injection Inject 300 mcg into the skin every 14 (fourteen) days.   Yes Historical Provider, MD  Fe Fum-Vit C-Vit B12-FA (TRIGELS-F) 460-60-0.01-1 MG CAPS capsule Take 1 capsule by mouth daily. 02/16/13  Yes Shaune Pascal Bensimhon, MD  Fluticasone-Salmeterol (ADVAIR DISKUS) 100-50 MCG/DOSE AEPB Inhale 1 puff into the lungs 2 (two) times daily. 06/05/12  Yes Renato Shin, MD  insulin detemir (LEVEMIR) 100 UNIT/ML injection Inject 1 mL (100 Units total) into the skin every morning. 09/23/13  Yes Biagio Borg, MD  isosorbide mononitrate (IMDUR) 60 MG 24 hr tablet Take 1 tablet (60 mg total) by mouth daily. 07/27/13  Yes Jolaine Artist, MD  omeprazole (PRILOSEC) 20 MG capsule Take 20 mg by mouth daily. 08/07/13  Yes Shaune Pascal Bensimhon, MD  potassium chloride SA (K-DUR,KLOR-CON) 20 MEQ tablet Take 1 tablet (20 mEq total) by mouth daily. 09/07/13  Yes Biagio Borg, MD  torsemide (DEMADEX) 20 MG tablet Take by mouth 2 (two) times daily. Alternating days. Day 1: Take 40 mg at lunch and 40 mg at dinner.  Day 2: Take 40  mg at lunch and 30 mg at dinner.  Repeat. 05/22/13  Yes Biagio Borg, MD   Allergies  Allergen Reactions  . Ace Inhibitors Other (See Comments)    me  hyposion  . Beta Adrenergic Blockers Other (See Comments)     use cautiously secondary to 2nd degree heart block, hypotension    FAMILY HISTORY:  Family History  Problem Relation Age of Onset  . Heart disease Father   . Dementia Mother   . CAD Sister    SOCIAL HISTORY:  reports that he has quit smoking. He has never used smokeless tobacco. He reports that he does not drink alcohol or use illicit drugs.  REVIEW OF SYSTEMS:   All negative; except for those that are bolded, which indicate positives.  Constitutional: weight loss, weight gain, night sweats, fevers, chills, fatigue, weakness.  HEENT: headaches, sore throat, sneezing, nasal congestion, post nasal drip, difficulty swallowing, tooth/dental problems, visual complaints, visual changes, ear aches. Neuro: difficulty with speech, weakness, numbness, ataxia.  CV:  chest pain, orthopnea, PND, swelling in lower extremities, dizziness, palpitations, syncope.  Resp: cough, hemoptysis, dyspnea, wheezing. GI  heartburn, indigestion, abdominal pain, nausea, vomiting, diarrhea, constipation, change in bowel habits, loss of appetite, hematemesis, melena, hematochezia.  GU: dysuria, change in color of urine, urgency or frequency, flank pain, hematuria. MSK: joint pain or swelling, decreased range of motion. Psych: change in mood or affect, depression, anxiety, suicidal ideations, homicidal ideations. Skin: rash, itching, bruising.   SUBJECTIVE:  Patient doing well this morning, back to baseline breathing, no major respiratory complaints.   VITAL SIGNS: Temp:  [98.2 F (36.8 C)-99.3 F (37.4 C)] 98.5 F (36.9 C) (09/04 0620) Pulse Rate:  [82-84] 83 (09/04 0620) Resp:  [16-20] 16 (09/04 0620) BP: (138-178)/(52-72) 153/64 mmHg (09/04 0620) SpO2:  [93 %-97 %] 93 % (09/04 0754) Weight:   [232 lb 12.9 oz (105.6 kg)] 232 lb 12.9 oz (105.6 kg) (09/04 0620)  PHYSICAL EXAMINATION: General: Chronically ill appearing male, resting in bed, in NAD. Neuro: A&O x 3, non-focal.  HEENT: [place]/AT. PERRL, sclerae anicteric. Cardiovascular: RRR, no M/R/G.  Lungs: good effort, no further crackles, good air entry bilaterally, mild dec bs at the bases. On 3L O2 via [place]. Abdomen: BS x 4, soft, NT/ND.  Musculoskeletal: No gross deformities, no edema.  Skin: Venous stasis ulcers on b/l LE's.    Recent Labs Lab 10/21/13 2012 10/22/13 0112 10/23/13 0405  NA 141 145 144  K 4.2 4.0 4.0  CL 104 108 108  CO2 20 21 24   BUN 45* 43* 34*  CREATININE 2.08* 2.04* 1.91*  GLUCOSE 173* 192* 101*    Recent Labs Lab 10/22/13 0112 10/22/13 1239 10/23/13 0405  HGB 9.6* 9.3* 8.9*  HCT 32.4* 32.0* 31.1*  WBC 7.0 8.3 8.0  PLT 435* 379 362   Dg Chest 2 View  10/21/2013   CLINICAL DATA:  Shortness of breath  EXAM: CHEST  2 VIEW  COMPARISON:  04/20/2013  FINDINGS: Right upper lobe pneumonia. In the lateral projection there is also basilar consolidation, also likely on the right. Cardiomegaly with pulmonary venous congestion and diffuse interstitial opacity. Suspect small pleural effusions. No pneumothorax.  IMPRESSION: 1. Right-sided pneumonia. 2. Cardiomegaly and pulmonary edema.   Electronically Signed   By: Jorje Guild M.D.   On: 10/21/2013 20:09   Dg Abd 1 View  10/22/2013   CLINICAL DATA:  Abdominal pain and distention, history coronary artery disease, hypertension, diabetes, chronic diastolic heart failure, stroke, prostate cancer  EXAM: ABDOMEN - 1 VIEW  COMPARISON:  None ; correlation made with radionuclide bone scan 04/24/2013  FINDINGS: Scattered gas and stool in colon.  Few nonspecific upper normal caliber small bowel loops in mid abdomen.  No definite evidence of obstruction or wall thickening.  Slightly increased stool in colon.  Sclerotic L2 vertebral body question osseous metastatic disease.   Underpenetration limits assessment of remaining osseous structures.  Surgical clips in pelvis bilaterally.  IMPRESSION: Nonobstructive bowel gas pattern.  Slightly prominent stool in rectum.  Sclerotic L2 vertebral body suspicious for sclerotic osseous metastatic disease in this patient with a history of prostate cancer ; this corresponds to previously identified scintigraphic abnormality.   Electronically Signed   By: Lavonia Dana M.D.   On: 10/22/2013 10:44   Ct Chest Wo Contrast  10/22/2013   CLINICAL DATA:  Shortness of breath, history of prostate cancer  EXAM: CT CHEST WITHOUT CONTRAST  TECHNIQUE: Multidetector CT imaging of the chest was performed following the standard protocol without  IV contrast.  COMPARISON:  10/21/2013 chest radiograph, 04/22/2013 chest CT  FINDINGS: Scattered atherosclerotic disease of the aorta and branch vessels without aneurysmal dilatation.  Cardiomegaly. Coronary artery and aortic valvular calcifications. No pericardial effusion.  Small bilateral pleural effusions. Persistent mediastinal adenopathy. As index right infra carinal measuring 2.4 cm short axis on image 29 series 2, 2.2 cm on the prior. Right hilar nodes also appear prominent but difficult to separate from adjacent vessels.  Small hiatal hernia.  Upper abdominal images show nothing acute.  Central airways show some mucous/secretions along the right wall of the trachea. Left lower lobe mild bronchial narrowing.  Centrilobular emphysema. Increased interstitial and hazy airspace opacities along the periphery of the right greater than left lung and lower lobes.  Multifocal sclerotic lesions.  IMPRESSION: Small pleural effusions. Interstitial and hazy airspace opacities may reflect pulmonary edema or multifocal pneumonia superimposed on COPD.  Prominent mediastinal lymph nodes persist. Sclerotic metastases again noted.  Cardiomegaly.   Electronically Signed   By: Carlos Levering M.D.   On: 10/22/2013 00:19    ASSESSMENT  / PLAN:  Pulmonary Edema Bronchiectasis with possible mild flair / exacerbation Emphysema - 3L O2 dependent nocturnally Mild pulm fibrosis Bilateral small pleural effusions OSA Recs: Cont supplemental O2 to maintain SpO2 > 92. Cont lasix per primary. Cont Vanc / Zosyn (may give 5 days total, or dc if no fever or inc in wbc) Follow up sputum culture, AFB for NTM - still pending DuoNebs scheduled / Albuterol PRN. Cont Dulera, guaifenesin. Pulmonary toiletry.   Thank you for consulting [place] PCCM, we will signoff at this time, please feel free to call with questions.    Consult Time = 8mins  Vilinda Boehringer, MD [facility] Pager [phone] On Call Pager [phone]

## 2013-10-23 NOTE — Telephone Encounter (Signed)
F/U call to daughter regarding Bone scan and her questions regarding pt's dx. Per MD, explained to daughter that bone scan can be done while pt is in hospital and [name] will arrange for that to happen, and in regards to the pts dx "they should stick to what we discussed at that time."  Daughter expressed gratitude. Denies further questions at this time.

## 2013-10-23 NOTE — Progress Notes (Signed)
PROGRESS NOTE    [name] IFO:[id number] DOB: 1932/05/25 DOA: 10/21/2013 PCP: [name], MD  HPI/Brief narrative 80 year old male patient with history of chronic diastolic CHF, A. fib-poor Coumadin candidate, CAD, COPD CVA, HLD, HTN, type II DM, OSA, CKD3, prostate cancer status post prostatectomy-sclerotic lesion at the spine being evaluated OP for metastasis, multifactorial anemia (renal disease, iron deficiency and possible MDS)-getting 2 weekly PRBC transfusion, Aranesp shots and iron supplements, chronic respiratory failure on nightly oxygen, transferred from the [facility] secondary to worsening cough and dyspnea post blood transfusion. He is history of URI and productive sputum for a week prior to admission. No fevers or chills reported. He was admitted for acute on chronic respiratory failure secondary to decompensated CHF and possible pneumonia. Pulmonary consulted.   Assessment/Plan:  1. Acute on chronic diastolic CHF: Precipitated by volume overload from blood transfusion. Treated with IV Lasix 40 mg twice a day. Improved. Cardiology input appreciated. Consider transition to by mouth diuretics in a.m. 2. Possible healthcare associated pneumonia: Started empirically on IV vancomycin and Zosyn >will transition to oral levofloxacin on 9/4. 3. Acute on chronic respiratory failure with hypoxia: Secondary to decompensated CHF and pneumonia complicating underlying COPD, OSA. Pulmonary consultation appreciated. Continue diuretics and antibiotics as above. Continue supportive treatment with oxygen, bronchodilators and Dulera. Improving. 4. COPD: No bronchospasm on exam. Management as above. 5. Hypertension: Mildly uncontrolled. Continue amlodipine & transdermal clonidine. Medications may need titration. Consider reducing clonidine if having persistent bradycardia. 6. History of A. fib: Continue amiodarone. Not a candidate for anticoagulation. Sinus rhythm on monitor. 7. Multifactorial  anemia: Status post 2 units PRBC transfusion on 9/2. Improved. Stable. 8. 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 9. History of hyperlipidemia: Continue statins. 10. History of prostate cancer: Being followed by [name] OP. Concern for sclerotic bone met and supposed to have restaging whole body scan on 11/03/13- [name] plans to order bone scan inpatient. 11. History of CAD: Stable. Continue nitrates & ASA 12. ? Rectal bleed: Family reported blood in his depends x1 but not sure. Also reported black stools but has been on iron supplements. Check stool for occult blood. 13. Stage III chronic kidney disease: Creatinine at baseline. 14. Constipation: Bowel regimen. May be the reason for problem #2 15. Heart block/NSVT: Asymptomatic. Patient has frequent episodes of Mobitz type 1 second degree heart block but at times seems to be having Mobitz type II pattern and an episode of 7 beat NSVT. Cardiology consulted. Echo shows diastolic dysfunction.    Code Status: DO NOT RESUSCITATE Family Communication: Discussed with 3 sons at bedside on 9/3. Disposition Plan: Home when medically stable   Consultants:  Pulmonology  Cardiology  Procedures:  None  Antibiotics:  IV vancomycin 9/2 > 9/4  IV Zosyn 9/2 > 9/4  Oral levofloxacin 9/4 >  Subjective: Denies dyspnea, palpitations or chest pain..  Objective: Filed Vitals:   10/23/13 6767 10/23/13 0754 10/23/13 1428 10/23/13 1435  BP: 153/64  137/61   Pulse: 83  76   Temp: 98.5 F (36.9 C)  98.1 F (36.7 C)   TempSrc: Oral  Oral   Resp: 16  16   Height:      Weight: 105.6 kg (232 lb 12.9 oz)     SpO2: 95% 93% 98% 98%    Intake/Output Summary (Last 24 hours) at 10/23/13 1621 Last data filed at 10/23/13 1430  Gross per 24 hour  Intake   1010  ml  Output   2920 ml  Net  -1910 ml   Filed Weights   10/22/13 0053 10/23/13 0620  Weight: 108.3 kg (238 lb 12.1 oz) 105.6 kg  (232 lb 12.9 oz)     Exam:  General exam: Pleasant elderly male sitting comfortably on chair. Respiratory system: Reduced breath sounds right lung fields greater than left with scattered crackles in the right lung fields. No increased work of breathing. Cardiovascular system: S1 & S2 heard, RRR. No JVD, murmurs, gallops, clicks. Trace bilateral leg edema. Telemetry: Sinus rhythm with first degree AV block, frequent Mobitz type 1 second degree AV block and occasional? Mobitz type II. NSVT 7 beats x1. Gastrointestinal system: Abdomen is nondistended, soft and nontender. Normal bowel sounds heard. Central nervous system: Alert and oriented. No focal neurological deficits. Extremities: Symmetric 5 x 5 power.   Data Reviewed: Basic Metabolic Panel:  Recent Labs Lab 10/21/13 1034 10/21/13 2012 10/22/13 0112 10/23/13 0405  NA 142 141 145 144  K 4.1 4.2 4.0 4.0  CL  --  104 108 108  CO2 22 20 21 24   GLUCOSE 101 173* 192* 101*  BUN 45.8* 45* 43* 34*  CREATININE 2.3* 2.08* 2.04* 1.91*  CALCIUM 8.7 8.9 9.3 8.8  MG  --   --  2.1 2.1   Liver Function Tests:  Recent Labs Lab 10/21/13 1034 10/21/13 2012 10/22/13 0112  AST 16 21 14   ALT 17 15 13   ALKPHOS 73 77 75  BILITOT <0.20 0.3 0.3  PROT 6.5 7.2 7.2  ALBUMIN 2.3* 2.6* 2.6*   No results found for this basename: LIPASE, AMYLASE,  in the last 168 hours No results found for this basename: AMMONIA,  in the last 168 hours CBC:  Recent Labs Lab 10/21/13 1031 10/21/13 2012 10/22/13 0112 10/22/13 1239 10/23/13 0405  WBC 5.2 5.2 7.0 8.3 8.0  NEUTROABS 3.7 3.9 5.7  --   --   HGB 7.5* 10.7* 9.6* 9.3* 8.9*  HCT 25.4* 36.4* 32.4* 32.0* 31.1*  MCV 88.6 89.4 88.8 90.7 90.7  PLT 402* 356 435* 379 362   Cardiac Enzymes:  Recent Labs Lab 10/21/13 2012 10/22/13 0112 10/22/13 0630 10/22/13 1239  TROPONINI <0.30 <0.30 <0.30 <0.30   BNP (last 3 results)  Recent Labs  04/03/13 1155 04/20/13 1116 10/21/13 2012  PROBNP  893.8* 1139.0* 1541.0*   CBG:  Recent Labs Lab 10/22/13 1157 10/22/13 1618 10/22/13 2118 10/23/13 0745 10/23/13 1158  GLUCAP 186* 162* 130* 92 166*    No results found for this or any previous visit (from the past 240 hour(s)).      Studies: Dg Chest 2 View  10/21/2013   CLINICAL DATA:  Shortness of breath  EXAM: CHEST  2 VIEW  COMPARISON:  04/20/2013  FINDINGS: Right upper lobe pneumonia. In the lateral projection there is also basilar consolidation, also likely on the right. Cardiomegaly with pulmonary venous congestion and diffuse interstitial opacity. Suspect small pleural effusions. No pneumothorax.  IMPRESSION: 1. Right-sided pneumonia. 2. Cardiomegaly and pulmonary edema.   Electronically Signed   By: Jorje Guild M.D.   On: 10/21/2013 20:09   Dg Abd 1 View  10/22/2013   CLINICAL DATA:  Abdominal pain and distention, history coronary artery disease, hypertension, diabetes, chronic diastolic heart failure, stroke, prostate cancer  EXAM: ABDOMEN - 1 VIEW  COMPARISON:  None ; correlation made with radionuclide bone scan 04/24/2013  FINDINGS: Scattered gas and stool in colon.  Few nonspecific upper normal caliber small bowel loops  in mid abdomen.  No definite evidence of obstruction or wall thickening.  Slightly increased stool in colon.  Sclerotic L2 vertebral body question osseous metastatic disease.  Underpenetration limits assessment of remaining osseous structures.  Surgical clips in pelvis bilaterally.  IMPRESSION: Nonobstructive bowel gas pattern.  Slightly prominent stool in rectum.  Sclerotic L2 vertebral body suspicious for sclerotic osseous metastatic disease in this patient with a history of prostate cancer ; this corresponds to previously identified scintigraphic abnormality.   Electronically Signed   By: Lavonia Dana M.D.   On: 10/22/2013 10:44   Ct Chest Wo Contrast  10/22/2013   CLINICAL DATA:  Shortness of breath, history of prostate cancer  EXAM: CT CHEST WITHOUT  CONTRAST  TECHNIQUE: Multidetector CT imaging of the chest was performed following the standard protocol without IV contrast.  COMPARISON:  10/21/2013 chest radiograph, 04/22/2013 chest CT  FINDINGS: Scattered atherosclerotic disease of the aorta and branch vessels without aneurysmal dilatation.  Cardiomegaly. Coronary artery and aortic valvular calcifications. No pericardial effusion.  Small bilateral pleural effusions. Persistent mediastinal adenopathy. As index right infra carinal measuring 2.4 cm short axis on image 29 series 2, 2.2 cm on the prior. Right hilar nodes also appear prominent but difficult to separate from adjacent vessels.  Small hiatal hernia.  Upper abdominal images show nothing acute.  Central airways show some mucous/secretions along the right wall of the trachea. Left lower lobe mild bronchial narrowing.  Centrilobular emphysema. Increased interstitial and hazy airspace opacities along the periphery of the right greater than left lung and lower lobes.  Multifocal sclerotic lesions.  IMPRESSION: Small pleural effusions. Interstitial and hazy airspace opacities may reflect pulmonary edema or multifocal pneumonia superimposed on COPD.  Prominent mediastinal lymph nodes persist. Sclerotic metastases again noted.  Cardiomegaly.   Electronically Signed   By: Carlos Levering M.D.   On: 10/22/2013 00:19        Scheduled Meds: . allopurinol  100 mg Oral QHS  . amiodarone  100 mg Oral q morning - 10a  . amLODipine  10 mg Oral Daily  . aspirin EC  81 mg Oral Daily  . atorvastatin  40 mg Oral Daily  . citalopram  20 mg Oral Daily  . [START ON 10/28/2013] cloNIDine  0.1 mg Transdermal Weekly  . furosemide  40 mg Intravenous BID  . guaiFENesin  600 mg Oral BID  . insulin aspart  0-5 Units Subcutaneous QHS  . insulin aspart  0-9 Units Subcutaneous TID WC  . insulin detemir  50 Units Subcutaneous Daily  . ipratropium-albuterol  3 mL Nebulization Q6H  . isosorbide mononitrate  60 mg Oral  Daily  . levofloxacin  750 mg Oral Q48H  . mometasone-formoterol  2 puff Inhalation BID  . pantoprazole  40 mg Oral Daily  . silver sulfADIAZINE   Topical BID   Followed by  . [START ON 10/25/2013] silver sulfADIAZINE   Topical Daily  . sodium chloride  3 mL Intravenous Q12H   Continuous Infusions:   Active Problems:   DIABETES MELLITUS, TYPE II, UNCONTROLLED   HYPERTENSION   CORONARY ARTERY DISEASE   Atrial fibrillation   Chronic diastolic heart failure   Anemia   Hypertensive kidney disease with CKD stage III   Prostate cancer   Chronic respiratory failure   Acute on chronic diastolic CHF (congestive heart failure), NYHA class 1   Bronchiectasis with acute exacerbation   HCAP (healthcare-associated pneumonia)   Acute on chronic respiratory failure with hypoxia    Time  spent: 30 minutes.    Vernell Leep, MD, FACP, FHM. [facility] Pager [phone]  If 7PM-7AM, please contact night-coverage [email or web address] Password [email or web address] 10/23/2013, 4:21 PM    LOS: 2 days

## 2013-10-24 ENCOUNTER — Inpatient Hospital Stay (HOSPITAL_COMMUNITY): Payer: Medicare PPO

## 2013-10-24 DIAGNOSIS — Z79899 Other long term (current) drug therapy: Secondary | ICD-10-CM

## 2013-10-24 DIAGNOSIS — R001 Bradycardia, unspecified: Secondary | ICD-10-CM

## 2013-10-24 DIAGNOSIS — I472 Ventricular tachycardia: Secondary | ICD-10-CM

## 2013-10-24 DIAGNOSIS — I4729 Other ventricular tachycardia: Secondary | ICD-10-CM

## 2013-10-24 LAB — CBC
HCT: 31.8 % — ABNORMAL LOW (ref 39.0–52.0)
Hemoglobin: 9 g/dL — ABNORMAL LOW (ref 13.0–17.0)
MCH: 25.7 pg — AB (ref 26.0–34.0)
MCHC: 28.3 g/dL — AB (ref 30.0–36.0)
MCV: 90.9 fL (ref 78.0–100.0)
Platelets: 364 10*3/uL (ref 150–400)
RBC: 3.5 MIL/uL — ABNORMAL LOW (ref 4.22–5.81)
RDW: 16.2 % — AB (ref 11.5–15.5)
WBC: 6.6 10*3/uL (ref 4.0–10.5)

## 2013-10-24 LAB — GLUCOSE, CAPILLARY
GLUCOSE-CAPILLARY: 118 mg/dL — AB (ref 70–99)
Glucose-Capillary: 138 mg/dL — ABNORMAL HIGH (ref 70–99)
Glucose-Capillary: 121 mg/dL — ABNORMAL HIGH (ref 70–99)
Glucose-Capillary: 162 mg/dL — ABNORMAL HIGH (ref 70–99)
Glucose-Capillary: 142 mg/dL — ABNORMAL HIGH (ref 70–99)

## 2013-10-24 LAB — BASIC METABOLIC PANEL
ANION GAP: 14 (ref 5–15)
BUN: 37 mg/dL — ABNORMAL HIGH (ref 6–23)
CALCIUM: 8.7 mg/dL (ref 8.4–10.5)
CO2: 23 mEq/L (ref 19–32)
Chloride: 105 mEq/L (ref 96–112)
Creatinine, Ser: 1.92 mg/dL — ABNORMAL HIGH (ref 0.50–1.35)
GFR, EST AFRICAN AMERICAN: 37 mL/min — AB (ref >90)
GFR, EST NON AFRICAN AMERICAN: 32 mL/min — AB (ref >90)
Glucose, Bld: 169 mg/dL — ABNORMAL HIGH (ref 70–99)
Potassium: 3.7 mEq/L (ref 3.7–5.3)
SODIUM: 142 meq/L (ref 137–147)

## 2013-10-24 LAB — OCCULT BLOOD X 1 CARD TO LAB, STOOL: Fecal Occult Bld: NEGATIVE

## 2013-10-24 MED ORDER — TORSEMIDE 20 MG PO TABS
20.0000 mg | ORAL_TABLET | Freq: Two times a day (BID) | ORAL | Status: DC
  Administered 2013-10-24: 20 mg via ORAL
  Filled 2013-10-24 (×4): qty 1

## 2013-10-24 MED ORDER — POTASSIUM CHLORIDE CRYS ER 20 MEQ PO TBCR
20.0000 meq | EXTENDED_RELEASE_TABLET | Freq: Once | ORAL | Status: AC
  Administered 2013-10-24: 20 meq via ORAL
  Filled 2013-10-24: qty 1

## 2013-10-24 NOTE — Progress Notes (Signed)
Patient ID: [name], male   DOB: January 26, 1933, 80 y.o.   MRN: [id number]    SUBJECTIVE:   The patient says he's feeling somewhat better. He is slow to respond. There has been some diuresis. Renal function is abnormal but stable today. Potassium is decreased at 3.7 today.   Filed Vitals:   10/23/13 1435 10/23/13 1933 10/23/13 2025 10/24/13 0507  BP:   135/61 156/69  Pulse:   81 77  Temp:   98.1 F (36.7 C) 98.1 F (36.7 C)  TempSrc:   Oral Oral  Resp:   18 20  Height:      Weight:    235 lb 3.7 oz (106.7 kg)  SpO2: 98% 97% 100% 100%     Intake/Output Summary (Last 24 hours) at 10/24/13 0648 Last data filed at 10/24/13 0510  Gross per 24 hour  Intake    960 ml  Output   1600 ml  Net   -640 ml    LABS: Basic Metabolic Panel:  Recent Labs  10/22/13 0112 10/23/13 0405 10/24/13 0400  NA 145 144 142  K 4.0 4.0 3.7  CL 108 108 105  CO2 21 24 23   GLUCOSE 192* 101* 169*  BUN 43* 34* 37*  CREATININE 2.04* 1.91* 1.92*  CALCIUM 9.3 8.8 8.7  MG 2.1 2.1  --    Liver Function Tests:  Recent Labs  10/21/13 2012 10/22/13 0112  AST 21 14  ALT 15 13  ALKPHOS 77 75  BILITOT 0.3 0.3  PROT 7.2 7.2  ALBUMIN 2.6* 2.6*   No results found for this basename: LIPASE, AMYLASE,  in the last 72 hours CBC:  Recent Labs  10/21/13 2012 10/22/13 0112  10/23/13 0405 10/24/13 0400  WBC 5.2 7.0  < > 8.0 6.6  NEUTROABS 3.9 5.7  --   --   --   HGB 10.7* 9.6*  < > 8.9* 9.0*  HCT 36.4* 32.4*  < > 31.1* 31.8*  MCV 89.4 88.8  < > 90.7 90.9  PLT 356 435*  < > 362 364  < > = values in this interval not displayed. Cardiac Enzymes:  Recent Labs  10/22/13 0112 10/22/13 0630 10/22/13 1239  TROPONINI <0.30 <0.30 <0.30   BNP: No components found with this basename: POCBNP,  D-Dimer: No results found for this basename: DDIMER,  in the last 72 hours Hemoglobin A1C:  Recent Labs  10/22/13 0112  HGBA1C 5.8*   Fasting Lipid Panel: No results found for this basename: CHOL,  HDL, LDLCALC, TRIG, CHOLHDL, LDLDIRECT,  in the last 72 hours Thyroid Function Tests:  Recent Labs  10/22/13 0112  TSH 2.540    RADIOLOGY: Dg Chest 2 View  10/21/2013   CLINICAL DATA:  Shortness of breath  EXAM: CHEST  2 VIEW  COMPARISON:  04/20/2013  FINDINGS: Right upper lobe pneumonia. In the lateral projection there is also basilar consolidation, also likely on the right. Cardiomegaly with pulmonary venous congestion and diffuse interstitial opacity. Suspect small pleural effusions. No pneumothorax.  IMPRESSION: 1. Right-sided pneumonia. 2. Cardiomegaly and pulmonary edema.   Electronically Signed   By: Jorje Guild M.D.   On: 10/21/2013 20:09   Dg Abd 1 View  10/22/2013   CLINICAL DATA:  Abdominal pain and distention, history coronary artery disease, hypertension, diabetes, chronic diastolic heart failure, stroke, prostate cancer  EXAM: ABDOMEN - 1 VIEW  COMPARISON:  None ; correlation made with radionuclide bone scan 04/24/2013  FINDINGS: Scattered gas and stool in colon.  Few nonspecific upper normal caliber small bowel loops in mid abdomen.  No definite evidence of obstruction or wall thickening.  Slightly increased stool in colon.  Sclerotic L2 vertebral body question osseous metastatic disease.  Underpenetration limits assessment of remaining osseous structures.  Surgical clips in pelvis bilaterally.  IMPRESSION: Nonobstructive bowel gas pattern.  Slightly prominent stool in rectum.  Sclerotic L2 vertebral body suspicious for sclerotic osseous metastatic disease in this patient with a history of prostate cancer ; this corresponds to previously identified scintigraphic abnormality.   Electronically Signed   By: Lavonia Dana M.D.   On: 10/22/2013 10:44   Ct Chest Wo Contrast  10/22/2013   CLINICAL DATA:  Shortness of breath, history of prostate cancer  EXAM: CT CHEST WITHOUT CONTRAST  TECHNIQUE: Multidetector CT imaging of the chest was performed following the standard protocol without IV  contrast.  COMPARISON:  10/21/2013 chest radiograph, 04/22/2013 chest CT  FINDINGS: Scattered atherosclerotic disease of the aorta and branch vessels without aneurysmal dilatation.  Cardiomegaly. Coronary artery and aortic valvular calcifications. No pericardial effusion.  Small bilateral pleural effusions. Persistent mediastinal adenopathy. As index right infra carinal measuring 2.4 cm short axis on image 29 series 2, 2.2 cm on the prior. Right hilar nodes also appear prominent but difficult to separate from adjacent vessels.  Small hiatal hernia.  Upper abdominal images show nothing acute.  Central airways show some mucous/secretions along the right wall of the trachea. Left lower lobe mild bronchial narrowing.  Centrilobular emphysema. Increased interstitial and hazy airspace opacities along the periphery of the right greater than left lung and lower lobes.  Multifocal sclerotic lesions.  IMPRESSION: Small pleural effusions. Interstitial and hazy airspace opacities may reflect pulmonary edema or multifocal pneumonia superimposed on COPD.  Prominent mediastinal lymph nodes persist. Sclerotic metastases again noted.  Cardiomegaly.   Electronically Signed   By: Carlos Levering M.D.   On: 10/22/2013 00:19    PHYSICAL EXAM    Patient has slowed her spine. He says he is feeling better. Head is atraumatic. Lungs reveal continued crackles at the bases. Cardiac exam reveals S1 and S2. Abdomen is soft. His legs are wrapped but there is question of continued trace edema.   TELEMETRY: I have reviewed telemetry today October 24, 2013. There is sinus rhythm. There is intermittent Wenckebach. There is no high degree AV block. Scattered PVCs are seen.   ASSESSMENT AND PLAN:     HYPERTENSION    Acute on chronic diastolic CHF (congestive heart failure),      Patient is improving. However I'm not sure he is at optimal baseline yet. I would consider switching him to oral diuretics today. He needs to be ambulated.  I've ordered a chest x-ray for today to followup.      Bradycardia, sinus    He does have 0.8 by. His rhythm is stable. No change in therapy.    NSVT (nonsustained ventricular tachycardia)      Patient had very limited ectopy. Continue to watch his electrolytes carefully. Keep him on low-dose amiodarone.    On amiodarone therapy    The plan is to continue the patient on low-dose amiodarone.  Dr. Harrington Challenger noted in her consult note yesterday that the patient has an appointment in the CHF clinic on November 02, 2013.   Dola Argyle 10/24/2013 6:48 AM

## 2013-10-24 NOTE — Progress Notes (Signed)
PROGRESS NOTE    [name] ZOX:[id number] DOB: August 26, 1932 DOA: 10/21/2013 PCP: [name], MD  HPI/Brief narrative 80 year old male patient with history of chronic diastolic CHF, A. fib-poor Coumadin candidate, CAD, COPD CVA, HLD, HTN, type II DM, OSA, CKD3, prostate cancer status post prostatectomy-sclerotic lesion at the spine being evaluated OP for metastasis, multifactorial anemia (renal disease, iron deficiency and possible MDS)-getting 2 weekly PRBC transfusion, Aranesp shots and iron supplements, chronic respiratory failure on nightly oxygen, transferred [facility] secondary to worsening cough and dyspnea post blood transfusion. He is history of URI and productive sputum for a week prior to admission. No fevers or chills reported. He was admitted for acute on chronic respiratory failure secondary to decompensated CHF and possible pneumonia. Pulmonary consulted.   Assessment/Plan:  1. Acute on chronic diastolic CHF: Precipitated by volume overload from blood transfusion. Treated with IV Lasix 40 mg twice a day. Improved. Cardiology input appreciated. Will change to PO lasix 9/5. 2. Possible healthcare associated pneumonia: Started empirically on IV vancomycin and Zosyn > transitioned to oral levofloxacin on 9/4. 3. Acute on chronic respiratory failure with hypoxia: Secondary to decompensated CHF and pneumonia complicating underlying COPD, OSA. Pulmonary consultation appreciated. Continue diuretics and antibiotics as above. Continue supportive treatment with oxygen, bronchodilators and Dulera. Improving. 4. COPD: No bronchospasm on exam. Management as above. 5. Hypertension: Mildly uncontrolled. Continue amlodipine & transdermal clonidine. Medications may need titration. Consider reducing clonidine if having persistent bradycardia. 6. History of A. fib: Continue amiodarone. Not a candidate for anticoagulation. Sinus rhythm on monitor. 7. Multifactorial anemia: Status post 2  units PRBC transfusion on 9/2. Improved. Stable. 8. 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 9. History of hyperlipidemia: Continue statins. 10. History of prostate cancer: Being followed by [name] OP. Concern for sclerotic bone met and supposed to have restaging whole body scan on 11/03/13- [name] plans to order bone scan inpatient - none- will order. 11. History of CAD: Stable. Continue nitrates & ASA 12. ? Rectal bleed: Family reported blood in his depends x1 but not sure. Also reported black stools but has been on iron supplements. FOBT neg. 13. Stage III chronic kidney disease: Creatinine at baseline. 14. Constipation: Bowel regimen. May be the reason for problem #2 15. Heart block/NSVT: Asymptomatic. Patient has frequent episodes of Mobitz type 1 second degree heart block but at times seems to be having Mobitz type II pattern and an episode of 7 beat NSVT. Cardiology consulted. Echo shows diastolic dysfunction.    Code Status: DO NOT RESUSCITATE Family Communication: Discussed with 3 sons at bedside on 9/3. Disposition Plan: Home when medically stable   Consultants:  Pulmonology  Cardiology  Procedures:  None  Antibiotics:  IV vancomycin 9/2 > 9/4  IV Zosyn 9/2 > 9/4  Oral levofloxacin 9/4 >  Subjective: "My breathing is fine". No acute issues as per nursing.  Objective: Filed Vitals:   10/23/13 1933 10/23/13 2025 10/24/13 0507 10/24/13 0755  BP:  135/61 156/69   Pulse:  81 77   Temp:  98.1 F (36.7 C) 98.1 F (36.7 C)   TempSrc:  Oral Oral   Resp:  18 20   Height:      Weight:   106.7 kg (235 lb 3.7 oz)   SpO2: 97% 100% 100% 95%    Intake/Output Summary (Last 24 hours) at 10/24/13 1209 Last data filed at 10/24/13 1053  Gross per 24 hour  Intake  840 ml  Output   1950 ml  Net  -1110 ml   Filed Weights   10/22/13 0053 10/23/13 0620 10/24/13 0507  Weight: 108.3 kg (238 lb 12.1 oz) 105.6 kg  (232 lb 12.9 oz) 106.7 kg (235 lb 3.7 oz)     Exam:  General exam: Pleasant elderly male sitting comfortably on chair. Respiratory system: Reduced breath sounds right lung fields greater than left with scattered crackles in the right lung fields- improving. No increased work of breathing. Cardiovascular system: S1 & S2 heard, RRR. No JVD, murmurs, gallops, clicks. Trace bilateral leg edema. Telemetry: Sinus rhythm with first degree AV block, frequent Mobitz type 1 second degree AV block and occasional? Mobitz type II. No further NSVT's. Gastrointestinal system: Abdomen is nondistended, soft and nontender. Normal bowel sounds heard. Central nervous system: Alert and oriented. No focal neurological deficits. Extremities: Symmetric 5 x 5 power.   Data Reviewed: Basic Metabolic Panel:  Recent Labs Lab 10/21/13 1034 10/21/13 2012 10/22/13 0112 10/23/13 0405 10/24/13 0400  NA 142 141 145 144 142  K 4.1 4.2 4.0 4.0 3.7  CL  --  104 108 108 105  CO2 22 20 21 24 23   GLUCOSE 101 173* 192* 101* 169*  BUN 45.8* 45* 43* 34* 37*  CREATININE 2.3* 2.08* 2.04* 1.91* 1.92*  CALCIUM 8.7 8.9 9.3 8.8 8.7  MG  --   --  2.1 2.1  --    Liver Function Tests:  Recent Labs Lab 10/21/13 1034 10/21/13 2012 10/22/13 0112  AST 16 21 14   ALT 17 15 13   ALKPHOS 73 77 75  BILITOT <0.20 0.3 0.3  PROT 6.5 7.2 7.2  ALBUMIN 2.3* 2.6* 2.6*   No results found for this basename: LIPASE, AMYLASE,  in the last 168 hours No results found for this basename: AMMONIA,  in the last 168 hours CBC:  Recent Labs Lab 10/21/13 1031 10/21/13 2012 10/22/13 0112 10/22/13 1239 10/23/13 0405 10/24/13 0400  WBC 5.2 5.2 7.0 8.3 8.0 6.6  NEUTROABS 3.7 3.9 5.7  --   --   --   HGB 7.5* 10.7* 9.6* 9.3* 8.9* 9.0*  HCT 25.4* 36.4* 32.4* 32.0* 31.1* 31.8*  MCV 88.6 89.4 88.8 90.7 90.7 90.9  PLT 402* 356 435* 379 362 364   Cardiac Enzymes:  Recent Labs Lab 10/21/13 2012 10/22/13 0112 10/22/13 0630 10/22/13 1239   TROPONINI <0.30 <0.30 <0.30 <0.30   BNP (last 3 results)  Recent Labs  04/03/13 1155 04/20/13 1116 10/21/13 2012  PROBNP 893.8* 1139.0* 1541.0*   CBG:  Recent Labs Lab 10/23/13 0745 10/23/13 1158 10/23/13 1659 10/23/13 2206 10/24/13 0748  GLUCAP 92 166* 143* 138* 121*    No results found for this or any previous visit (from the past 240 hour(s)).      Studies: Dg Chest 2 View  10/24/2013   CLINICAL DATA:  Follow-up CHF  EXAM: CHEST  2 VIEW  COMPARISON:  CT chest dated 10/21/2013  FINDINGS: Mild patchy right perihilar opacity, suspicious for asymmetric interstitial edema versus pneumonia. Underlying chronic interstitial lung disease and small bilateral pleural effusions, better visualized on CT.  Cardiomegaly.  Mild degenerative changes of the visualized thoracolumbar spine.  IMPRESSION: Mild patchy right perihilar opacity, suspicious for asymmetric interstitial edema versus pneumonia.  Small bilateral pleural effusions, better visualized on CT.   Electronically Signed   By: Julian Hy M.D.   On: 10/24/2013 10:12        Scheduled Meds: . allopurinol  100 mg  Oral QHS  . amiodarone  100 mg Oral q morning - 10a  . amLODipine  10 mg Oral Daily  . aspirin EC  81 mg Oral Daily  . atorvastatin  40 mg Oral Daily  . citalopram  20 mg Oral Daily  . [START ON 10/28/2013] cloNIDine  0.1 mg Transdermal Weekly  . furosemide  40 mg Intravenous BID  . guaiFENesin  600 mg Oral BID  . insulin aspart  0-5 Units Subcutaneous QHS  . insulin aspart  0-9 Units Subcutaneous TID WC  . insulin detemir  50 Units Subcutaneous Daily  . isosorbide mononitrate  60 mg Oral Daily  . levofloxacin  750 mg Oral Q48H  . mometasone-formoterol  2 puff Inhalation BID  . pantoprazole  40 mg Oral Daily  . silver sulfADIAZINE   Topical BID   Followed by  . [START ON 10/25/2013] silver sulfADIAZINE   Topical Daily  . sodium chloride  3 mL Intravenous Q12H   Continuous Infusions:   Active  Problems:   DIABETES MELLITUS, TYPE II, UNCONTROLLED   HYPERTENSION   CORONARY ARTERY DISEASE   Atrial fibrillation   Chronic diastolic heart failure   Anemia   Hypertensive kidney disease with CKD stage III   Prostate cancer   Chronic respiratory failure   Acute on chronic diastolic CHF (congestive heart failure), NYHA class 1   Bronchiectasis with acute exacerbation   HCAP (healthcare-associated pneumonia)   Acute on chronic respiratory failure with hypoxia   Bradycardia, sinus   NSVT (nonsustained ventricular tachycardia)   On amiodarone therapy    Time spent: 30 minutes.    Vernell Leep, MD, FACP, FHM. [facility] Pager [phone]  If 7PM-7AM, please contact night-coverage [email or web address] Password [email or web address] 10/24/2013, 12:09 PM    LOS: 3 days

## 2013-10-25 LAB — GLUCOSE, CAPILLARY
GLUCOSE-CAPILLARY: 112 mg/dL — AB (ref 70–99)
GLUCOSE-CAPILLARY: 167 mg/dL — AB (ref 70–99)
GLUCOSE-CAPILLARY: 158 mg/dL — AB (ref 70–99)
Glucose-Capillary: 163 mg/dL — ABNORMAL HIGH (ref 70–99)

## 2013-10-25 MED ORDER — FUROSEMIDE 10 MG/ML IJ SOLN
80.0000 mg | INTRAMUSCULAR | Status: AC
  Administered 2013-10-25: 80 mg via INTRAVENOUS

## 2013-10-25 MED ORDER — TORSEMIDE 20 MG PO TABS
20.0000 mg | ORAL_TABLET | Freq: Two times a day (BID) | ORAL | Status: DC
  Filled 2013-10-25 (×4): qty 1

## 2013-10-25 NOTE — Progress Notes (Signed)
Patient ID: [name], male   DOB: 05-26-1932, 80 y.o.   MRN: [id number]    SUBJECTIVE: Patient says he feels better. However his diuresis was very limited today. He still has rales on physical exam. His chest x-ray yesterday still suggested some increased fluid. I'm giving him 80 of IV Lasix this morning instead of his morning dose of torsemide. He is on torsemide 20 mg twice a day as an outpatient. He may need a higher dose when he goes home.   Filed Vitals:   10/24/13 1307 10/24/13 2027 10/24/13 2210 10/25/13 0516  BP: 145/68  144/63 145/62  Pulse: 72  78 79  Temp: 98.8 F (37.1 C)  98.2 F (36.8 C) 98.2 F (36.8 C)  TempSrc: Oral  Oral Oral  Resp: 18  20 20   Height:      Weight:    233 lb 7.5 oz (105.9 kg)  SpO2: 97% 96% 100% 100%     Intake/Output Summary (Last 24 hours) at 10/25/13 0723 Last data filed at 10/25/13 0600  Gross per 24 hour  Intake   1480 ml  Output   1280 ml  Net    200 ml    LABS: Basic Metabolic Panel:  Recent Labs  10/23/13 0405 10/24/13 0400  NA 144 142  K 4.0 3.7  CL 108 105  CO2 24 23  GLUCOSE 101* 169*  BUN 34* 37*  CREATININE 1.91* 1.92*  CALCIUM 8.8 8.7  MG 2.1  --    Liver Function Tests: No results found for this basename: AST, ALT, ALKPHOS, BILITOT, PROT, ALBUMIN,  in the last 72 hours No results found for this basename: LIPASE, AMYLASE,  in the last 72 hours CBC:  Recent Labs  10/23/13 0405 10/24/13 0400  WBC 8.0 6.6  HGB 8.9* 9.0*  HCT 31.1* 31.8*  MCV 90.7 90.9  PLT 362 364   Cardiac Enzymes:  Recent Labs  10/22/13 1239  TROPONINI <0.30   BNP: No components found with this basename: POCBNP,  D-Dimer: No results found for this basename: DDIMER,  in the last 72 hours Hemoglobin A1C: No results found for this basename: HGBA1C,  in the last 72 hours Fasting Lipid Panel: No results found for this basename: CHOL, HDL, LDLCALC, TRIG, CHOLHDL, LDLDIRECT,  in the last 72 hours Thyroid Function Tests: No  results found for this basename: TSH, T4TOTAL, FREET3, T3FREE, THYROIDAB,  in the last 72 hours  RADIOLOGY: Dg Chest 2 View  10/24/2013   CLINICAL DATA:  Follow-up CHF  EXAM: CHEST  2 VIEW  COMPARISON:  CT chest dated 10/21/2013  FINDINGS: Mild patchy right perihilar opacity, suspicious for asymmetric interstitial edema versus pneumonia. Underlying chronic interstitial lung disease and small bilateral pleural effusions, better visualized on CT.  Cardiomegaly.  Mild degenerative changes of the visualized thoracolumbar spine.  IMPRESSION: Mild patchy right perihilar opacity, suspicious for asymmetric interstitial edema versus pneumonia.  Small bilateral pleural effusions, better visualized on CT.   Electronically Signed   By: Julian Hy M.D.   On: 10/24/2013 10:12   Dg Chest 2 View  10/21/2013   CLINICAL DATA:  Shortness of breath  EXAM: CHEST  2 VIEW  COMPARISON:  04/20/2013  FINDINGS: Right upper lobe pneumonia. In the lateral projection there is also basilar consolidation, also likely on the right. Cardiomegaly with pulmonary venous congestion and diffuse interstitial opacity. Suspect small pleural effusions. No pneumothorax.  IMPRESSION: 1. Right-sided pneumonia. 2. Cardiomegaly and pulmonary edema.   Electronically Signed  By: Jorje Guild M.D.   On: 10/21/2013 20:09   Dg Abd 1 View  10/22/2013   CLINICAL DATA:  Abdominal pain and distention, history coronary artery disease, hypertension, diabetes, chronic diastolic heart failure, stroke, prostate cancer  EXAM: ABDOMEN - 1 VIEW  COMPARISON:  None ; correlation made with radionuclide bone scan 04/24/2013  FINDINGS: Scattered gas and stool in colon.  Few nonspecific upper normal caliber small bowel loops in mid abdomen.  No definite evidence of obstruction or wall thickening.  Slightly increased stool in colon.  Sclerotic L2 vertebral body question osseous metastatic disease.  Underpenetration limits assessment of remaining osseous structures.   Surgical clips in pelvis bilaterally.  IMPRESSION: Nonobstructive bowel gas pattern.  Slightly prominent stool in rectum.  Sclerotic L2 vertebral body suspicious for sclerotic osseous metastatic disease in this patient with a history of prostate cancer ; this corresponds to previously identified scintigraphic abnormality.   Electronically Signed   By: Lavonia Dana M.D.   On: 10/22/2013 10:44   Ct Chest Wo Contrast  10/22/2013   CLINICAL DATA:  Shortness of breath, history of prostate cancer  EXAM: CT CHEST WITHOUT CONTRAST  TECHNIQUE: Multidetector CT imaging of the chest was performed following the standard protocol without IV contrast.  COMPARISON:  10/21/2013 chest radiograph, 04/22/2013 chest CT  FINDINGS: Scattered atherosclerotic disease of the aorta and branch vessels without aneurysmal dilatation.  Cardiomegaly. Coronary artery and aortic valvular calcifications. No pericardial effusion.  Small bilateral pleural effusions. Persistent mediastinal adenopathy. As index right infra carinal measuring 2.4 cm short axis on image 29 series 2, 2.2 cm on the prior. Right hilar nodes also appear prominent but difficult to separate from adjacent vessels.  Small hiatal hernia.  Upper abdominal images show nothing acute.  Central airways show some mucous/secretions along the right wall of the trachea. Left lower lobe mild bronchial narrowing.  Centrilobular emphysema. Increased interstitial and hazy airspace opacities along the periphery of the right greater than left lung and lower lobes.  Multifocal sclerotic lesions.  IMPRESSION: Small pleural effusions. Interstitial and hazy airspace opacities may reflect pulmonary edema or multifocal pneumonia superimposed on COPD.  Prominent mediastinal lymph nodes persist. Sclerotic metastases again noted.  Cardiomegaly.   Electronically Signed   By: Carlos Levering M.D.   On: 10/22/2013 00:19    PHYSICAL EXAM  patient's in his chair. His Foley is still in place. He has not  ambulated. Lungs reveal a slower rales worse on the right than the left. His legs are wrapped. Cardiac exam reveals S1-S2.   TELEMETRY: I have reviewed telemetry today October 25, 2013. There sinus rhythm with intermittent Wenckebach.   ASSESSMENT AND PLAN:     Acute on chronic diastolic CHF (congestive heart failure), NYHA class 1    The assessment of his volume status is difficult. His physical exam remains abnormal. However there is an additional history of bronchiectasis. His chest x-ray suggests that he may still be volume overloaded. I've ordered 80 of IV Lasix for him this morning and said his oral torsemide. He hopes to go home. Options would be to consider keeping him longer or sending him home on a higher dose of torsemide than his usual home dose. He does have an appointment in the heart failure clinic on October 14 at 10:45 AM    Bradycardia, sinus     His rhythm is stable. No change in therapy.    NSVT (nonsustained ventricular tachycardia)       No evidence of  recurrent significant ventricular ectopy.    On amiodarone therapy   Dola Argyle 10/25/2013 7:23 AM

## 2013-10-25 NOTE — Progress Notes (Addendum)
PROGRESS NOTE    [name] FSF:[id number] DOB: 1933-01-22 DOA: 10/21/2013 PCP: [name], MD  HPI/Brief narrative 80 year old male patient with history of chronic diastolic CHF, A. fib-poor Coumadin candidate, CAD, COPD CVA, HLD, HTN, type II DM, OSA, CKD3, prostate cancer status post prostatectomy-sclerotic lesion at the spine being evaluated OP for metastasis, multifactorial anemia (renal disease, iron deficiency and possible MDS)-getting 2 weekly PRBC transfusion, Aranesp shots and iron supplements, chronic respiratory failure on nightly oxygen, transferred from the [facility] secondary to worsening cough and dyspnea post blood transfusion. He is history of URI and productive sputum for a week prior to admission. No fevers or chills reported. He was admitted for acute on chronic respiratory failure secondary to decompensated CHF and possible pneumonia. Pulmonary consulted.   Assessment/Plan:  1. Acute on chronic diastolic CHF: Precipitated by volume overload from blood transfusion. Treated with IV Lasix 40 mg twice a day. Improved. Cardiology input appreciated. Changed to PO Demadex at home dose 20 mg BID on 9/5. However CXR suggests some CHF and Cards ordered a dose of IV Lasix for 9/6 2. Possible healthcare associated pneumonia: Started empirically on IV vancomycin and Zosyn > transitioned to oral levofloxacin on 9/4. FU CXR in 4-6 weeks to ensure resolution of PNA findings. 3. Acute on chronic respiratory failure with hypoxia: Secondary to decompensated CHF and pneumonia complicating underlying COPD, OSA. Pulmonary consultation appreciated. Continue diuretics and antibiotics as above. Continue supportive treatment with oxygen, bronchodilators and Dulera. Improving. 4. COPD: No bronchospasm on exam. Management as above. 5. Hypertension with possible Hypertensive Heart Disease: Mildly uncontrolled. Continue amlodipine & transdermal clonidine. Medications may need titration. Consider  reducing clonidine if having persistent bradycardia. 6. History of A. fib: Continue amiodarone. Not a candidate for anticoagulation. Sinus rhythm on monitor. 7. Multifactorial anemia: Status post 2 units PRBC transfusion on 9/2. Improved. Stable. 8. 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 9. History of hyperlipidemia: Continue statins. 10. History of prostate cancer: Being followed by [name] OP. Concern for sclerotic bone met and supposed to have restaging whole body scan on 11/03/13- [name] plans to order bone scan inpatient - none- ordered but non urgent studay and not sure if it will be done over this long holiday weekend (labor day). If not done and pt DC's in AM- can be done OP. 11. History of CAD: Stable. Continue nitrates & ASA 12. ? Rectal bleed: Family reported blood in his depends x1 but not sure. Also reported black stools but has been on iron supplements. FOBT neg. Resolved. 13. Stage III chronic kidney disease: Creatinine at baseline. 14. Constipation: Bowel regimen. May be the reason for problem #2 15. Heart block/NSVT: Asymptomatic. Patient has frequent episodes of Mobitz type 1 second degree heart block but at times seems to be having Mobitz type II pattern and an episode of 7 beat NSVT. Cardiology consulted. Echo shows diastolic dysfunction.    Code Status: DO NOT RESUSCITATE Family Communication: Discussed with daughter [name]. Disposition Plan: PT recommended SNF, but patient and daughter disagree and want to go home where he lives alone and has aides coming in twice a day for a couple of hours. Advised daughter and patient regarding our concern of generalized weakness, frailility and high fall risk with further complications and hence SNF recommendation. They however insist on going home.   Consultants:  Pulmonology  Cardiology  Procedures:  None  Antibiotics:  IV vancomycin 9/2 > 9/4  IV  Zosyn 9/2 > 9/4  Oral levofloxacin 9/4 >  Subjective: Denies complaints and anxious to go home. No acute issues as per nursing.  Objective: Filed Vitals:   10/24/13 2027 10/24/13 2210 10/25/13 0516 10/25/13 0758  BP:  144/63 145/62   Pulse:  78 79   Temp:  98.2 F (36.8 C) 98.2 F (36.8 C)   TempSrc:  Oral Oral   Resp:  20 20   Height:      Weight:   105.9 kg (233 lb 7.5 oz)   SpO2: 96% 100% 100% 95%    Intake/Output Summary (Last 24 hours) at 10/25/13 1043 Last data filed at 10/25/13 0900  Gross per 24 hour  Intake   1480 ml  Output   1280 ml  Net    200 ml   Filed Weights   10/23/13 0620 10/24/13 0507 10/25/13 0516  Weight: 105.6 kg (232 lb 12.9 oz) 106.7 kg (235 lb 3.7 oz) 105.9 kg (233 lb 7.5 oz)     Exam:  General exam: Pleasant elderly male sitting comfortably on chair. Respiratory system: Reduced breath sounds right lung fields greater than left with scattered crackles in the right lung fields- improving. No increased work of breathing. Cardiovascular system: S1 & S2 heard, RRR. No JVD, murmurs, gallops, clicks. Trace bilateral leg edema. Telemetry: Sinus rhythm with first degree AV block, frequent Mobitz type 1 second degree AV block and occasional? Mobitz type II. No further NSVT's. Gastrointestinal system: Abdomen is nondistended, soft and nontender. Normal bowel sounds heard. Central nervous system: Alert and oriented. No focal neurological deficits. Extremities: Symmetric 5 x 5 power.   Data Reviewed: Basic Metabolic Panel:  Recent Labs Lab 10/21/13 1034 10/21/13 2012 10/22/13 0112 10/23/13 0405 10/24/13 0400  NA 142 141 145 144 142  K 4.1 4.2 4.0 4.0 3.7  CL  --  104 108 108 105  CO2 22 20 21 24 23   GLUCOSE 101 173* 192* 101* 169*  BUN 45.8* 45* 43* 34* 37*  CREATININE 2.3* 2.08* 2.04* 1.91* 1.92*  CALCIUM 8.7 8.9 9.3 8.8 8.7  MG  --   --  2.1 2.1  --    Liver Function Tests:  Recent Labs Lab 10/21/13 1034 10/21/13 2012 10/22/13 0112   AST 16 21 14   ALT 17 15 13   ALKPHOS 73 77 75  BILITOT <0.20 0.3 0.3  PROT 6.5 7.2 7.2  ALBUMIN 2.3* 2.6* 2.6*   No results found for this basename: LIPASE, AMYLASE,  in the last 168 hours No results found for this basename: AMMONIA,  in the last 168 hours CBC:  Recent Labs Lab 10/21/13 1031 10/21/13 2012 10/22/13 0112 10/22/13 1239 10/23/13 0405 10/24/13 0400  WBC 5.2 5.2 7.0 8.3 8.0 6.6  NEUTROABS 3.7 3.9 5.7  --   --   --   HGB 7.5* 10.7* 9.6* 9.3* 8.9* 9.0*  HCT 25.4* 36.4* 32.4* 32.0* 31.1* 31.8*  MCV 88.6 89.4 88.8 90.7 90.7 90.9  PLT 402* 356 435* 379 362 364   Cardiac Enzymes:  Recent Labs Lab 10/21/13 2012 10/22/13 0112 10/22/13 0630 10/22/13 1239  TROPONINI <0.30 <0.30 <0.30 <0.30   BNP (last 3 results)  Recent Labs  04/03/13 1155 04/20/13 1116 10/21/13 2012  PROBNP 893.8* 1139.0* 1541.0*   CBG:  Recent Labs Lab 10/24/13 0748 10/24/13 1156 10/24/13 1720 10/24/13 2207 10/25/13 0731  GLUCAP 121* 162* 142* 118* 112*    No results found for this or any previous visit (from the past  240 hour(s)).      Studies: Dg Chest 2 View  10/24/2013   CLINICAL DATA:  Follow-up CHF  EXAM: CHEST  2 VIEW  COMPARISON:  CT chest dated 10/21/2013  FINDINGS: Mild patchy right perihilar opacity, suspicious for asymmetric interstitial edema versus pneumonia. Underlying chronic interstitial lung disease and small bilateral pleural effusions, better visualized on CT.  Cardiomegaly.  Mild degenerative changes of the visualized thoracolumbar spine.  IMPRESSION: Mild patchy right perihilar opacity, suspicious for asymmetric interstitial edema versus pneumonia.  Small bilateral pleural effusions, better visualized on CT.   Electronically Signed   By: Julian Hy M.D.   On: 10/24/2013 10:12        Scheduled Meds: . allopurinol  100 mg Oral QHS  . amiodarone  100 mg Oral q morning - 10a  . amLODipine  10 mg Oral Daily  . aspirin EC  81 mg Oral Daily  .  atorvastatin  40 mg Oral Daily  . citalopram  20 mg Oral Daily  . [START ON 10/28/2013] cloNIDine  0.1 mg Transdermal Weekly  . furosemide  80 mg Intravenous NOW  . guaiFENesin  600 mg Oral BID  . insulin aspart  0-5 Units Subcutaneous QHS  . insulin aspart  0-9 Units Subcutaneous TID WC  . insulin detemir  50 Units Subcutaneous Daily  . isosorbide mononitrate  60 mg Oral Daily  . levofloxacin  750 mg Oral Q48H  . mometasone-formoterol  2 puff Inhalation BID  . pantoprazole  40 mg Oral Daily  . silver sulfADIAZINE   Topical Daily  . sodium chloride  3 mL Intravenous Q12H  . torsemide  20 mg Oral BID   Continuous Infusions:   Active Problems:   DIABETES MELLITUS, TYPE II, UNCONTROLLED   HYPERTENSION   CORONARY ARTERY DISEASE   Atrial fibrillation   Chronic diastolic heart failure   Anemia   Hypertensive kidney disease with CKD stage III   Prostate cancer   Chronic respiratory failure   Acute on chronic diastolic CHF (congestive heart failure), NYHA class 1   Bronchiectasis with acute exacerbation   HCAP (healthcare-associated pneumonia)   Acute on chronic respiratory failure with hypoxia   Bradycardia, sinus   NSVT (nonsustained ventricular tachycardia)   On amiodarone therapy    Time spent: 30 minutes.    Vernell Leep, MD, FACP, FHM. [facility] Pager [phone]  If 7PM-7AM, please contact night-coverage [email or web address] Password [email or web address] 10/25/2013, 10:43 AM    LOS: 4 days

## 2013-10-25 NOTE — Progress Notes (Signed)
Up to BR and had a moderate amount of clear, thin mucous from rectum.  No notable stool.  Condom cath fell off and has been replaced

## 2013-10-26 LAB — BASIC METABOLIC PANEL
Anion gap: 12 (ref 5–15)
BUN: 37 mg/dL — ABNORMAL HIGH (ref 6–23)
CHLORIDE: 105 meq/L (ref 96–112)
CO2: 24 mEq/L (ref 19–32)
CREATININE: 2.01 mg/dL — AB (ref 0.50–1.35)
Calcium: 8.9 mg/dL (ref 8.4–10.5)
GFR calc non Af Amer: 30 mL/min — ABNORMAL LOW (ref >90)
GFR, EST AFRICAN AMERICAN: 35 mL/min — AB (ref >90)
Glucose, Bld: 127 mg/dL — ABNORMAL HIGH (ref 70–99)
Potassium: 3.5 mEq/L — ABNORMAL LOW (ref 3.7–5.3)
SODIUM: 141 meq/L (ref 137–147)

## 2013-10-26 LAB — GLUCOSE, CAPILLARY
Glucose-Capillary: 128 mg/dL — ABNORMAL HIGH (ref 70–99)
Glucose-Capillary: 127 mg/dL — ABNORMAL HIGH (ref 70–99)

## 2013-10-26 MED ORDER — SILVER SULFADIAZINE 1 % EX CREA: TOPICAL_CREAM | Freq: Every day | CUTANEOUS | Status: DC

## 2013-10-26 MED ORDER — INSULIN DETEMIR 100 UNIT/ML ~~LOC~~ SOLN: 50.0000 [IU] | SUBCUTANEOUS | Status: DC

## 2013-10-26 MED ORDER — LEVOFLOXACIN 750 MG PO TABS: 750.0000 mg | ORAL_TABLET | ORAL | Status: AC

## 2013-10-26 MED ORDER — TORSEMIDE 20 MG PO TABS: 40.0000 mg | ORAL_TABLET | Freq: Two times a day (BID) | ORAL | Status: DC

## 2013-10-26 MED ORDER — TORSEMIDE 20 MG PO TABS
40.0000 mg | ORAL_TABLET | Freq: Two times a day (BID) | ORAL | Status: DC
  Administered 2013-10-26: 40 mg via ORAL
  Filled 2013-10-26 (×3): qty 2

## 2013-10-26 MED ORDER — POTASSIUM CHLORIDE CRYS ER 20 MEQ PO TBCR
20.0000 meq | EXTENDED_RELEASE_TABLET | Freq: Once | ORAL | Status: AC
  Administered 2013-10-26: 20 meq via ORAL
  Filled 2013-10-26: qty 1

## 2013-10-26 NOTE — Progress Notes (Signed)
Physical Therapy Treatment Patient Details Name: [name] MRN: [id number] DOB: 1932-03-27 Today's Date: 10/26/2013    History of Present Illness 80 year old male patient with history of chronic diastolic CHF,  Afib, CAD, COPD CVA, HLD, HTN, type II DM, OSA, CKD, prostate cancer status post prostatectomy-sclerotic lesion at the spine being evaluated OP for metastasis, multifactorial anemia (renal disease, iron deficiency and possible MDS)-getting 2 weekly PRBC transfusion, Aranesp shots and iron supplements, chronic respiratory failure on nightly oxygen, admitted for acute on chronic respiratory failure secondary to decompensated CHF and possible HCAP.    PT Comments    Pt performed stand pivot transfers with assist and w/c mobility.  Pt reports he dislikes manual w/c and prefers his power w/c.  Pt declines SNF at this time despite recommendation.  Follow Up Recommendations  SNF;Supervision/Assistance - 24 hour     Equipment Recommendations  None recommended by PT    Recommendations for Other Services       Precautions / Restrictions Precautions Precautions: Fall    Mobility  Bed Mobility               General bed mobility comments: pt up in recliner on arrival  Transfers Overall transfer level: Needs assistance Equipment used: None Transfers: Sit to/from [employer] Transfers Sit to Stand: Mod assist;+2 physical assistance Stand pivot transfers: Mod assist;+2 physical assistance       General transfer comment: verbal cues for safe technique including hand placement, pivot transfer x 3 for use of W/C and BSC, cues for weight shifting  Ambulation/Gait                 Hotel manager mobility: Yes Wheelchair propulsion: Both upper extremities;Both lower extermities Wheelchair parts: Needs assistance Distance: 100 Wheelchair Assistance Details (indicate cue type and reason): pt requires  increased verbal and visual cues for manuveuring and propelling w/c esp with turning and using LEs to assist with propulsion, pt reports he dislikes manual w/c and prefers his power chair  Modified Rankin (Stroke Patients Only)       Balance                                    Cognition Arousal/Alertness: Awake/alert Behavior During Therapy: WFL for tasks assessed/performed Overall Cognitive Status: Within Functional Limits for tasks assessed                      Exercises      General Comments        Pertinent Vitals/Pain Pain Assessment: No/denies pain    Home Living                      Prior Function            PT Goals (current goals can now be found in the care plan section) Progress towards PT goals: Progressing toward goals    Frequency  Min 3X/week    PT Plan Current plan remains appropriate    Co-evaluation             End of Session   Activity Tolerance: Patient limited by fatigue Patient left: in chair;with call bell/phone within reach     Time: 1329-1356 PT Time Calculation (min): 27 min  Charges:  $Therapeutic Activity: 8-22 mins $Wheel Chair Management: 8-22  mins                    G Codes:      Dempsy Damiano,KATHrine E 11-09-13, 2:50 PM Carmelia Bake, PT, DPT 2013-11-09 Pager: [phone]

## 2013-10-26 NOTE — Progress Notes (Signed)
Discharge summary sent to payer through [facility]  

## 2013-10-26 NOTE — Progress Notes (Signed)
Patient discharged home with daughter, discharge instructions given and explained to patient/daughter and they verbalized understanding, denies any pain/distress, stasis ulcer on BLE cleaned and changed prior to discharge, no new wound noted, accompanied home by daughter.

## 2013-10-26 NOTE — Progress Notes (Signed)
ANTIBIOTIC CONSULT NOTE - FOLLOW UP  Pharmacy Consult for Levaquin Indication: HCAP  Allergies  Allergen Reactions  . Ace Inhibitors Other (See Comments)    me  hyposion  . Beta Adrenergic Blockers Other (See Comments)     use cautiously secondary to 2nd degree heart block, hypotension    Patient Measurements: Height: 5' 10.5" (179.1 cm) Weight: 238 lb 8.6 oz (108.2 kg) IBW/kg (Calculated) : 74.15  Vital Signs: Temp: 98.9 F (37.2 C) (09/07 0618) Temp src: Oral (09/07 0618) BP: 149/69 mmHg (09/07 1003) Pulse Rate: 66 (09/07 0618) Intake/Output from previous day: 09/06 0701 - 09/07 0700 In: 1560 [P.O.:1560] Out: 1640 [Urine:1640]  Labs:  Recent Labs  10/24/13 0400 10/26/13 0537  WBC 6.6  --   HGB 9.0*  --   PLT 364  --   CREATININE 1.92* 2.01*   Estimated Creatinine Clearance: 37 ml/min (by C-G formula based on Cr of 2.01).  Microbiology: No results found for this or any previous visit (from the past 720 hour(s)).  Anti-infectives: 9/3 >> zosyn >> 9/4 9/3 >> vancomycin >> 9/4 9/4 >> Levaquin >>   Assessment: 76 yoM with multiple co-morbidities, presented to [facility] ED 9[facility]2 with acute SOB after he received a blood transfusion at cancer center earlier that day (receives recurrent transfusions for anemia due to combo of chronic disease, myelodysplastic disorder, and iron deficiency anemia).  CXR and CT chest suggestive of RLL PNA. Zosyn and Vancomycin per Rx for PNA, narrowed to Levaquin for total abx duration of 8 days.  9/7, Day 5/8 total abx, Day 4/7 Levaquin  Tmax: afebrile  WBCs: WNL (9/5)  Renal: SCr increased to 2.01, CrCl ~ 37CG, ~ 30N   Goal of Therapy:  Appropriate abx dosing, eradication of infection.   Plan:   Levaquin 750mg  PO Q48h x 7 days  Pharmacy to sign-off note writing.  Peripheral follow up renal fxn and culture results as available.   Gretta Arab PharmD, BCPS Pager [phone] 10/26/2013 12:55 PM

## 2013-10-26 NOTE — Discharge Summary (Signed)
Physician Discharge Summary  [name] SWH:[id number] DOB: 01-24-1933 DOA: 10/21/2013  PCP: [name], MD  Admit date: 10/21/2013 Discharge date: 10/26/2013  Time spent: Greater than 30 minutes  Recommendations for Outpatient Follow-up:  1. Heart Failure Clinic: Keep prior appointment on 11/02/2013 at 10:45 AM. To be seen with repeat labs (CBC & BMP) 2. Dr. Zola Button, Oncology on 11/04/2013 at 9 AM. 3. FU CXR in 4-6 weeks to ensure resolution of PNA findings.  Discharge Diagnoses:  Active Problems:   DIABETES MELLITUS, TYPE II, UNCONTROLLED   HYPERTENSION   CORONARY ARTERY DISEASE   Atrial fibrillation   Chronic diastolic heart failure   Anemia   Hypertensive kidney disease with CKD stage III   Prostate cancer   Chronic respiratory failure   Acute on chronic diastolic CHF (congestive heart failure), NYHA class 1   Bronchiectasis with acute exacerbation   HCAP (healthcare-associated pneumonia)   Acute on chronic respiratory failure with hypoxia   Bradycardia, sinus   NSVT (nonsustained ventricular tachycardia)   On amiodarone therapy   Discharge Condition: Improved & Stable  Diet recommendation: Heart Heathy and Diabetic diet.  Filed Weights   10/24/13 0507 10/25/13 0516 10/26/13 0645  Weight: 106.7 kg (235 lb 3.7 oz) 105.9 kg (233 lb 7.5 oz) 108.2 kg (238 lb 8.6 oz)    History of present illness:  80 year old male patient with history of chronic diastolic CHF, A. fib-poor Coumadin candidate, CAD, COPD CVA, HLD, HTN, type II DM, OSA, CKD3, prostate cancer status post prostatectomy-sclerotic lesion at the spine being evaluated OP for metastasis, multifactorial anemia (renal disease, iron deficiency and possible MDS)-getting 2 weekly PRBC transfusion, Aranesp shots and iron supplements, chronic respiratory failure on nightly oxygen, transferred from the [place] secondary to worsening cough and dyspnea post blood transfusion. He is history of URI and productive sputum  for a week prior to admission. No fevers or chills reported. He was admitted for acute on chronic respiratory failure secondary to decompensated CHF and possible pneumonia. He lives alone and moves around by wheelchair. He has not walked in about a year.  Hospital Course:   1. Acute on chronic diastolic CHF: Precipitated by volume overload from blood transfusion. Treated with IV Lasix 40 mg twice a day. Improved. Cardiology input appreciated. Changed to PO Demadex at home dose 20 mg BID on 9/5. However CXR suggests some CHF and Cards ordered a dose of IV Lasix for 9/6. Cardiology has seen today and recommend increased Demadex dose of 40 mg BID at least for short term. Discussed with Dr. Ron Parker, Cardiology who clear cleared patient for DC- has close OP FU with cards on 9/14. 2. Possible healthcare associated pneumonia: Started empirically on IV vancomycin and Zosyn > transitioned to oral levofloxacin on 9/4- completed total 8 days Rx. FU CXR in 4-6 weeks to ensure resolution of PNA findings. Urine Legionella & strep Ag: Neg. 3. Acute on chronic respiratory failure with hypoxia: Secondary to decompensated CHF and pneumonia complicating underlying COPD, OSA. Pulmonary consultation appreciated. Continue diuretics and antibiotics as above. Continue supportive treatment with oxygen, bronchodilators and Dulera. Improving. Patient non ambulant/wheelchair mobile at home. O2 sats 86% at rest on room air. He was using only bedtime O2 and daytime PRN O2. Now he will need O2 continuously - reassess needs as OP. 4. COPD: No bronchospasm on exam. Management as above. 5. Hypertension with possible Hypertensive Heart Disease: Mildly uncontrolled. Continue amlodipine & transdermal clonidine. Medications may need titration. Consider reducing clonidine if having  persistent bradycardia. 6. History of A. fib: Continue amiodarone. Not a candidate for anticoagulation. Sinus rhythm on monitor. 7. Multifactorial anemia: Status post 2  units PRBC transfusion on 9/2. Improved. Stable. 8. Uncontrolled type II DM with renal complications: Continue Levemir at decreased dose. CBG's ranging b/w 127-167 on half of home dose of Levemir in hospital - will be DC'ed on reduced dose. Monitor closely and adjust meds OP as needed. A1C: 5.8. 9. History of hyperlipidemia: Continue statins. 10. History of prostate cancer: Being followed by [name] OP. Concern for sclerotic bone met and supposed to have restaging whole body scan on 11/03/13- can be done OP- discussed with daughter who verbalized understanding. 11. History of CAD: Stable. Continue nitrates & ASA 12. ? Rectal bleed: Family reported blood in his depends x1 but not sure. Also reported black stools but has been on iron supplements. FOBT neg. Resolved. 13. Stage III chronic kidney disease: Creatinine at baseline. 14. Constipation: Bowel regimen. May be the reason for problem #2 15. Heart block/NSVT: Asymptomatic. Patient has frequent episodes of Mobitz type 1 second degree heart block but at times seems to be having Mobitz type II pattern and an episode of 7 beat NSVT. Cardiology consulted. Echo shows diastolic dysfunction. Discussed with Dr. Ron Parker who has carefully reviewed telemetry and is not concerned for high degree heart block at this time. TSH: 2.540 16. B/L Lower extremity venous stasis ulcerations, R>L: Mx as per [place] consultation. Has OP FU with [facility] on 10/29/13.    Consultants:  Pulmonology  Cardiology  Procedures:  2 D Echo 10/22/2013: Study Conclusions  - Left ventricle: The cavity size was normal. There was moderate concentric hypertrophy. Systolic function was normal. Wall motion was normal; there were no regional wall motion abnormalities. Doppler parameters are consistent with a reversible restrictive pattern, indicative of decreased left ventricular diastolic compliance and/or increased left atrial pressure (grade 3 diastolic dysfunction). - Mitral valve:  There was mild to moderate regurgitation. - Left atrium: The atrium was moderately to severely dilated. - Pulmonary arteries: Systolic pressure was moderately increased. PA peak pressure: 56 mm Hg (S).    Discharge Exam:  Complaints: Denies SOB or any complaints. Wants to go home. Refuses SNF.   Filed Vitals:   10/26/13 0618 10/26/13 0645 10/26/13 0948 10/26/13 1003  BP: 149/64   149/69  Pulse: 66     Temp: 98.9 F (37.2 C)     TempSrc: Oral     Resp: 20     Height:      Weight:  108.2 kg (238 lb 8.6 oz)    SpO2: 99%  88%    General exam: Pleasant elderly male sitting comfortably on chair.  Respiratory system: Reduced breath sounds right lung fields greater than left with scattered crackles in the right lung fields- improving. No increased work of breathing.  Cardiovascular system: S1 & S2 heard, RRR. No JVD, murmurs, gallops, clicks. Trace bilateral leg edema. Telemetry: Sinus rhythm with first degree AV block, frequent Mobitz type 1 second degree AV block and occasional? Mobitz type II. No further NSVT's.  Gastrointestinal system: Abdomen is nondistended, soft and nontender. Normal bowel sounds heard.  Central nervous system: Alert and oriented. No focal neurological deficits.  Extremities: Symmetric 5 x 5 power. Right leg dressing clean & dry.   Discharge Instructions      Discharge Instructions   (HEART FAILURE PATIENTS) Call MD:  Anytime you have any of the following symptoms: 1) 3 pound weight gain in 24  hours or 5 pounds in 1 week 2) shortness of breath, with or without a dry hacking cough 3) swelling in the hands, feet or stomach 4) if you have to sleep on extra pillows at night in order to breathe.    Complete by:  As directed      Call MD for:  difficulty breathing, headache or visual disturbances    Complete by:  As directed      Diet - low sodium heart healthy    Complete by:  As directed      Diet Carb Modified    Complete by:  As directed      Discharge  instructions    Complete by:  As directed   Oxygen via nasal cannula at 3 liters per minute continuously.     Increase activity slowly    Complete by:  As directed             Medication List         allopurinol 100 MG tablet  Commonly known as:  ZYLOPRIM  Take 1 tablet (100 mg total) by mouth at bedtime.     amiodarone 200 MG tablet  Commonly known as:  PACERONE  Take 0.5 tablets (100 mg total) by mouth every morning.     amLODipine 10 MG tablet  Commonly known as:  NORVASC  Take 1 tablet (10 mg total) by mouth daily.     aspirin 81 MG EC tablet  Take 1 tablet (81 mg total) by mouth daily.     atorvastatin 40 MG tablet  Commonly known as:  LIPITOR  Take 40 mg by mouth daily.     citalopram 10 MG tablet  Commonly known as:  CELEXA  Take 20 mg by mouth daily.     cloNIDine 0.1 mg/24hr patch  Commonly known as:  CATAPRES - Dosed in mg/24 hr  Place 1 patch (0.1 mg total) onto the skin once a week. Changes patch on Thursday's     darbepoetin 300 MCG/0.6ML Soln injection  Commonly known as:  ARANESP  Inject 300 mcg into the skin every 14 (fourteen) days.     Fe Fum-Vit C-Vit B12-FA 460-60-0.01-1 MG Caps capsule  Commonly known as:  TRIGELS-F  Take 1 capsule by mouth daily.     Fluticasone-Salmeterol 100-50 MCG/DOSE Aepb  Commonly known as:  ADVAIR DISKUS  Inhale 1 puff into the lungs 2 (two) times daily.     insulin detemir 100 UNIT/ML injection  Commonly known as:  LEVEMIR  Inject 0.5 mLs (50 Units total) into the skin every morning.     isosorbide mononitrate 60 MG 24 hr tablet  Commonly known as:  IMDUR  Take 1 tablet (60 mg total) by mouth daily.     levofloxacin 750 MG tablet  Commonly known as:  LEVAQUIN  Take 1 tablet (750 mg total) by mouth every other day. Last dose on 10/27/2013.  Start taking on:  10/27/2013     omeprazole 20 MG capsule  Commonly known as:  PRILOSEC  Take 20 mg by mouth daily.     potassium chloride SA 20 MEQ tablet  Commonly  known as:  K-DUR,KLOR-CON  Take 1 tablet (20 mEq total) by mouth daily.     silver sulfADIAZINE 1 % cream  Commonly known as:  SILVADENE  - Apply topically daily. Topical, Daily, For 21 days  - Apply to Bilateral LE ulcerations twice daily for three days (Wednesday through Friday) then decrease dressing changes to daily  and continue for 21 days.     torsemide 20 MG tablet  Commonly known as:  DEMADEX  Take 2 tablets (40 mg total) by mouth 2 (two) times daily.       Follow-up Information   Follow up with Heart Failure Clinic On 11/02/2013. (Keep appointment at 10:45 AM. To be seen with repeat labs (CBC & BMP).)       Follow up with [facility], MD On 11/04/2013. (Keep appointment at 9 AM.)    Specialty:  Oncology   Contact information:   [address] [place] [address] [phone]        The results of significant diagnostics from this hospitalization (including imaging, microbiology, ancillary and laboratory) are listed below for reference.    Significant Diagnostic Studies: Dg Chest 2 View  10/24/2013   CLINICAL DATA:  Follow-up CHF  EXAM: CHEST  2 VIEW  COMPARISON:  CT chest dated 10/21/2013  FINDINGS: Mild patchy right perihilar opacity, suspicious for asymmetric interstitial edema versus pneumonia. Underlying chronic interstitial lung disease and small bilateral pleural effusions, better visualized on CT.  Cardiomegaly.  Mild degenerative changes of the visualized thoracolumbar spine.  IMPRESSION: Mild patchy right perihilar opacity, suspicious for asymmetric interstitial edema versus pneumonia.  Small bilateral pleural effusions, better visualized on CT.   Electronically Signed   By: Julian Hy M.D.   On: 10/24/2013 10:12   Dg Chest 2 View  10/21/2013   CLINICAL DATA:  Shortness of breath  EXAM: CHEST  2 VIEW  COMPARISON:  04/20/2013  FINDINGS: Right upper lobe pneumonia. In the lateral projection there is also basilar consolidation, also likely on the right.  Cardiomegaly with pulmonary venous congestion and diffuse interstitial opacity. Suspect small pleural effusions. No pneumothorax.  IMPRESSION: 1. Right-sided pneumonia. 2. Cardiomegaly and pulmonary edema.   Electronically Signed   By: Jorje Guild M.D.   On: 10/21/2013 20:09   Dg Abd 1 View  10/22/2013   CLINICAL DATA:  Abdominal pain and distention, history coronary artery disease, hypertension, diabetes, chronic diastolic heart failure, stroke, prostate cancer  EXAM: ABDOMEN - 1 VIEW  COMPARISON:  None ; correlation made with radionuclide bone scan 04/24/2013  FINDINGS: Scattered gas and stool in colon.  Few nonspecific upper normal caliber small bowel loops in mid abdomen.  No definite evidence of obstruction or wall thickening.  Slightly increased stool in colon.  Sclerotic L2 vertebral body question osseous metastatic disease.  Underpenetration limits assessment of remaining osseous structures.  Surgical clips in pelvis bilaterally.  IMPRESSION: Nonobstructive bowel gas pattern.  Slightly prominent stool in rectum.  Sclerotic L2 vertebral body suspicious for sclerotic osseous metastatic disease in this patient with a history of prostate cancer ; this corresponds to previously identified scintigraphic abnormality.   Electronically Signed   By: Lavonia Dana M.D.   On: 10/22/2013 10:44   Ct Chest Wo Contrast  10/22/2013   CLINICAL DATA:  Shortness of breath, history of prostate cancer  EXAM: CT CHEST WITHOUT CONTRAST  TECHNIQUE: Multidetector CT imaging of the chest was performed following the standard protocol without IV contrast.  COMPARISON:  10/21/2013 chest radiograph, 04/22/2013 chest CT  FINDINGS: Scattered atherosclerotic disease of the aorta and branch vessels without aneurysmal dilatation.  Cardiomegaly. Coronary artery and aortic valvular calcifications. No pericardial effusion.  Small bilateral pleural effusions. Persistent mediastinal adenopathy. As index right infra carinal measuring 2.4 cm  short axis on image 29 series 2, 2.2 cm on the prior. Right hilar nodes also appear prominent  but difficult to separate from adjacent vessels.  Small hiatal hernia.  Upper abdominal images show nothing acute.  Central airways show some mucous/secretions along the right wall of the trachea. Left lower lobe mild bronchial narrowing.  Centrilobular emphysema. Increased interstitial and hazy airspace opacities along the periphery of the right greater than left lung and lower lobes.  Multifocal sclerotic lesions.  IMPRESSION: Small pleural effusions. Interstitial and hazy airspace opacities may reflect pulmonary edema or multifocal pneumonia superimposed on COPD.  Prominent mediastinal lymph nodes persist. Sclerotic metastases again noted.  Cardiomegaly.   Electronically Signed   By: Carlos Levering M.D.   On: 10/22/2013 00:19    Microbiology: No results found for this or any previous visit (from the past 240 hour(s)).   Labs: Basic Metabolic Panel:  Recent Labs Lab 10/21/13 2012 10/22/13 0112 10/23/13 0405 10/24/13 0400 10/26/13 0537  NA 141 145 144 142 141  K 4.2 4.0 4.0 3.7 3.5*  CL 104 108 108 105 105  CO2 20 21 24 23 24   GLUCOSE 173* 192* 101* 169* 127*  BUN 45* 43* 34* 37* 37*  CREATININE 2.08* 2.04* 1.91* 1.92* 2.01*  CALCIUM 8.9 9.3 8.8 8.7 8.9  MG  --  2.1 2.1  --   --    Liver Function Tests:  Recent Labs Lab 10/21/13 1034 10/21/13 2012 10/22/13 0112  AST 16 21 14   ALT 17 15 13   ALKPHOS 73 77 75  BILITOT <0.20 0.3 0.3  PROT 6.5 7.2 7.2  ALBUMIN 2.3* 2.6* 2.6*   No results found for this basename: LIPASE, AMYLASE,  in the last 168 hours No results found for this basename: AMMONIA,  in the last 168 hours CBC:  Recent Labs Lab 10/21/13 1031 10/21/13 2012 10/22/13 0112 10/22/13 1239 10/23/13 0405 10/24/13 0400  WBC 5.2 5.2 7.0 8.3 8.0 6.6  NEUTROABS 3.7 3.9 5.7  --   --   --   HGB 7.5* 10.7* 9.6* 9.3* 8.9* 9.0*  HCT 25.4* 36.4* 32.4* 32.0* 31.1* 31.8*  MCV  88.6 89.4 88.8 90.7 90.7 90.9  PLT 402* 356 435* 379 362 364   Cardiac Enzymes:  Recent Labs Lab 10/21/13 2012 10/22/13 0112 10/22/13 0630 10/22/13 1239  TROPONINI <0.30 <0.30 <0.30 <0.30   BNP: BNP (last 3 results)  Recent Labs  04/03/13 1155 04/20/13 1116 10/21/13 2012  PROBNP 893.8* 1139.0* 1541.0*   CBG:  Recent Labs Lab 10/25/13 1203 10/25/13 1659 10/25/13 2150 10/26/13 0743 10/26/13 1217  GLUCAP 167* 158* 163* 128* 127*      Signed:  Vernell Leep, MD, FACP, FHM. [facility] Pager [phone]  If 7PM-7AM, please contact night-coverage [email or web address] Password [facility] 10/26/2013, 12:57 PM

## 2013-10-26 NOTE — Progress Notes (Signed)
Patient ID: [name], male   DOB: [birth year], 80 y.o.   MRN: [id number]    SUBJECTIVE: Patient is resting flat in bed. I had given him IV Lasix yesterday morning. His input and output has been approximately equal. His physical exam continues to be difficult. It appears he may have chronic rales and rhonchi at the bases. I certainly agree with Dr.Hongalgi that this patient would do better in a nursing home. I understand that the patient and the family insisted the patient go home.   Filed Vitals:   10/25/13 1908 10/25/13 2152 10/26/13 0618 10/26/13 0645  BP:  145/58 149/64   Pulse:  80 66   Temp:  98 F (36.7 C) 98.9 F (37.2 C)   TempSrc:  Oral Oral   Resp:  20 20   Height:      Weight:    238 lb 8.6 oz (108.2 kg)  SpO2: 96% 100% 99%      Intake/Output Summary (Last 24 hours) at 10/26/13 0713 Last data filed at 10/26/13 0700  Gross per 24 hour  Intake   1560 ml  Output   1640 ml  Net    -80 ml    LABS: Basic Metabolic Panel:  Recent Labs  10/24/13 0400 10/26/13 0537  NA 142 141  K 3.7 3.5*  CL 105 105  CO2 23 24  GLUCOSE 169* 127*  BUN 37* 37*  CREATININE 1.92* 2.01*  CALCIUM 8.7 8.9   Liver Function Tests: No results found for this basename: AST, ALT, ALKPHOS, BILITOT, PROT, ALBUMIN,  in the last 72 hours No results found for this basename: LIPASE, AMYLASE,  in the last 72 hours CBC:  Recent Labs  10/24/13 0400  WBC 6.6  HGB 9.0*  HCT 31.8*  MCV 90.9  PLT 364   Cardiac Enzymes: No results found for this basename: CKTOTAL, CKMB, CKMBINDEX, TROPONINI,  in the last 72 hours BNP: No components found with this basename: POCBNP,  D-Dimer: No results found for this basename: DDIMER,  in the last 72 hours Hemoglobin A1C: No results found for this basename: HGBA1C,  in the last 72 hours Fasting Lipid Panel: No results found for this basename: CHOL, HDL, LDLCALC, TRIG, CHOLHDL, LDLDIRECT,  in the last 72 hours Thyroid Function Tests: No results found  for this basename: TSH, T4TOTAL, FREET3, T3FREE, THYROIDAB,  in the last 72 hours  RADIOLOGY: Dg Chest 2 View  10/24/2013   CLINICAL DATA:  Follow-up CHF  EXAM: CHEST  2 VIEW  COMPARISON:  CT chest dated 10/21/2013  FINDINGS: Mild patchy right perihilar opacity, suspicious for asymmetric interstitial edema versus pneumonia. Underlying chronic interstitial lung disease and small bilateral pleural effusions, better visualized on CT.  Cardiomegaly.  Mild degenerative changes of the visualized thoracolumbar spine.  IMPRESSION: Mild patchy right perihilar opacity, suspicious for asymmetric interstitial edema versus pneumonia.  Small bilateral pleural effusions, better visualized on CT.   Electronically Signed   By: Julian Hy M.D.   On: 10/24/2013 10:12   Dg Chest 2 View  10/21/2013   CLINICAL DATA:  Shortness of breath  EXAM: CHEST  2 VIEW  COMPARISON:  04/20/2013  FINDINGS: Right upper lobe pneumonia. In the lateral projection there is also basilar consolidation, also likely on the right. Cardiomegaly with pulmonary venous congestion and diffuse interstitial opacity. Suspect small pleural effusions. No pneumothorax.  IMPRESSION: 1. Right-sided pneumonia. 2. Cardiomegaly and pulmonary edema.   Electronically Signed   By: Gilford Silvius.D.  On: 10/21/2013 20:09   Dg Abd 1 View  10/22/2013   CLINICAL DATA:  Abdominal pain and distention, history coronary artery disease, hypertension, diabetes, chronic diastolic heart failure, stroke, prostate cancer  EXAM: ABDOMEN - 1 VIEW  COMPARISON:  None ; correlation made with radionuclide bone scan 04/24/2013  FINDINGS: Scattered gas and stool in colon.  Few nonspecific upper normal caliber small bowel loops in mid abdomen.  No definite evidence of obstruction or wall thickening.  Slightly increased stool in colon.  Sclerotic L2 vertebral body question osseous metastatic disease.  Underpenetration limits assessment of remaining osseous structures.  Surgical clips  in pelvis bilaterally.  IMPRESSION: Nonobstructive bowel gas pattern.  Slightly prominent stool in rectum.  Sclerotic L2 vertebral body suspicious for sclerotic osseous metastatic disease in this patient with a history of prostate cancer ; this corresponds to previously identified scintigraphic abnormality.   Electronically Signed   By: Lavonia Dana M.D.   On: 10/22/2013 10:44   Ct Chest Wo Contrast  10/22/2013   CLINICAL DATA:  Shortness of breath, history of prostate cancer  EXAM: CT CHEST WITHOUT CONTRAST  TECHNIQUE: Multidetector CT imaging of the chest was performed following the standard protocol without IV contrast.  COMPARISON:  10/21/2013 chest radiograph, 04/22/2013 chest CT  FINDINGS: Scattered atherosclerotic disease of the aorta and branch vessels without aneurysmal dilatation.  Cardiomegaly. Coronary artery and aortic valvular calcifications. No pericardial effusion.  Small bilateral pleural effusions. Persistent mediastinal adenopathy. As index right infra carinal measuring 2.4 cm short axis on image 29 series 2, 2.2 cm on the prior. Right hilar nodes also appear prominent but difficult to separate from adjacent vessels.  Small hiatal hernia.  Upper abdominal images show nothing acute.  Central airways show some mucous/secretions along the right wall of the trachea. Left lower lobe mild bronchial narrowing.  Centrilobular emphysema. Increased interstitial and hazy airspace opacities along the periphery of the right greater than left lung and lower lobes.  Multifocal sclerotic lesions.  IMPRESSION: Small pleural effusions. Interstitial and hazy airspace opacities may reflect pulmonary edema or multifocal pneumonia superimposed on COPD.  Prominent mediastinal lymph nodes persist. Sclerotic metastases again noted.  Cardiomegaly.   Electronically Signed   By: Carlos Levering M.D.   On: 10/22/2013 00:19    PHYSICAL EXAM  patient is comfortable lying flat in bed. Lungs reveal rhonchi and rales at the  bases. This may be chronic. Cardiac exam reveals S1 and S2. His lower legs are wrapped. There is no definite peripheral edema.   TELEMETRY: I have reviewed telemetry today October 26, 2013. There is sinus rhythm. There is first degree AV block. There is some Wenkebach. There is no further ventricular tachycardia.   ASSESSMENT AND PLAN:    Acute on chronic diastolic CHF (congestive heart failure),     His volume status assessment is quite difficult. However considering all factors and watching him over several days, I will not push for further diuresis at this time. I have changed his Demadex from 20 twice a day to 40 twice a day. I would continue this dose on the short-term.    Bradycardia, sinus     His rhythm is stable.    NSVT (nonsustained ventricular tachycardia)      No further significant ventricular ectopy has been seen. Will be important to try to keep his electrolytes normal.    On amiodarone therapy     Amiodarone is being continued.    Hypokalemia     I've given him  one dose of potassium for today. Up to this time he has not been on potassium.  The patient has an appointment in the heart failure clinic on September 14 at 10:45 AM (I noted an incorrect date in my note yesterday). When the patient's overall status has stabilized, he could be discharged from the cardiac viewpoint.  . However the patient overall is quite debilitated. He has a multitude of medical illnesses. His primary care team is doing excellent job managing this. We will continue to follow him while he is in the hospital.  Dola Argyle 10/26/2013 7:13 AM

## 2013-10-27 ENCOUNTER — Telehealth: Payer: Self-pay

## 2013-10-27 DIAGNOSIS — S81802D Unspecified open wound, left lower leg, subsequent encounter: Secondary | ICD-10-CM

## 2013-10-27 NOTE — Telephone Encounter (Signed)
Referral done

## 2013-10-27 NOTE — Telephone Encounter (Signed)
Ok cont meds as is  OK to continue with wound clinic only, thanks

## 2013-10-27 NOTE — Telephone Encounter (Signed)
[facility] informed of MD instructions.

## 2013-10-27 NOTE — Telephone Encounter (Signed)
HHRN [facility]  BJ RN) states the is an interaction between Levaquin and celexa to Amiodarone.  Does he continue or change, she did state the patient is having no problems at this time.  Also the patient came home with ulcers on both legs and given silvadene cream to be used BID and has wound care appointment on  10/29/13.  The family will not apply the cream and [facility] cannot do as they have no orders.  HHRN did inform to keep appointment with wound care.  Does PCP want orders for wound care from [facility] for this patient?

## 2013-10-27 NOTE — Telephone Encounter (Signed)
The daughter has called back to inform the hospital was to put an order in for wound care, but they did not. The daughter is requesting that PCP put an order in to [facility] for wound care asap.

## 2013-10-27 NOTE — Addendum Note (Signed)
Addended by: Biagio Borg on: 10/27/2013 07:52 PM   Modules accepted: Orders

## 2013-10-28 ENCOUNTER — Telehealth: Payer: Self-pay | Admitting: Internal Medicine

## 2013-10-28 ENCOUNTER — Telehealth: Payer: Self-pay | Admitting: *Deleted

## 2013-10-28 NOTE — Telephone Encounter (Signed)
[facility] is requesting an order sent to [place] for a rolling walker with 5 inch wheels and 3 in 1.  [facility] does not provide this.

## 2013-10-28 NOTE — Telephone Encounter (Signed)
[name]'s number is [phone]

## 2013-10-28 NOTE — Telephone Encounter (Signed)
Done hardcopy to robin  

## 2013-10-28 NOTE — Telephone Encounter (Signed)
Transition Care Management Follow-up Telephone Call  How have you been since you were released from the hospital? Spoke with daughter [name])   Do you understand why you were in the hospital? YES, she stated dad understood why he was in the hospital  Do you understand the discharge instrcutions? YES Items Reviewed:  Medications reviewed: YES, went over medications with daughter finish antibiotic on yesterday  Allergies reviewed: {YES, daughter stated no chnages  Dietary changes reviewed: YES       Referrals reviewed: YES, daughter stated they had made him appt to go to wound center concerning his foot, but she called Dr. Jenny Reichmann and he has put order in for [facility] to come out to assist, and which [facility] has already contact them   Functional Questionnaire:  Activities of Daily Living (ADLs):   He states they are independent in the following: Daughter stated he is fine in that area States they require assistance with the following: No assistant needed  Any transportation issues/concerns?: NO  Any patient concerns? No concerns  Confirmed importance and date/time of follow-up visits scheduled: YES, Daughter wanted appt to be schedule for week of 9/21- due to father having so many appts. Made appt for 11/11/13 @ 10:00   Confirmed with patient if condition begins to worsen call PCP or go to the ER.  Patient was given the Call-a-Nurse line [phone]: YES

## 2013-10-28 NOTE — Telephone Encounter (Signed)
Levada Dy the daughter informed referral done.

## 2013-10-29 ENCOUNTER — Encounter (HOSPITAL_BASED_OUTPATIENT_CLINIC_OR_DEPARTMENT_OTHER): Payer: Medicare PPO | Attending: Internal Medicine

## 2013-10-29 NOTE — Telephone Encounter (Signed)
Faxed hardcopy to [place] at [phone]

## 2013-10-30 ENCOUNTER — Telehealth: Payer: Self-pay

## 2013-10-30 NOTE — Telephone Encounter (Signed)
Received call from [place], Doctor Phillips with [facility] stating since pt has been home from hospital he has a 7lbs weight gain. Per cardiologist not sure if he is taking his torsemide , heard crackling in lungs. Also requesting verbal order for wound order to clean (L) extremity with saline & cover  With gauze.Marland KitchenJohny Kim

## 2013-10-30 NOTE — Telephone Encounter (Signed)
Called [facility] informed of verbal ok to the RN. Called the PT informed of MD instructions.

## 2013-10-30 NOTE — Telephone Encounter (Signed)
[facility] to inform patients oxy at rest is between 92% and 93%. As soon as walking drops to 81%.  But after about a minute or so of sitting and deep breathing his oxygen returns to around 93%.  [facility] wanted PCP to be aware.  Patient has appointment with PCP on 11/11/13.

## 2013-10-30 NOTE — Telephone Encounter (Signed)
Consider OV sat clinic, or urgent care or ER if any worse  OK for other verbal

## 2013-11-02 ENCOUNTER — Ambulatory Visit (HOSPITAL_COMMUNITY)
Admit: 2013-11-02 | Discharge: 2013-11-02 | Disposition: A | Discharge status: Home / Self Care | Payer: Medicare PPO | Source: Ambulatory Visit | Attending: Internal Medicine | Admitting: Internal Medicine

## 2013-11-02 VITALS — BP 132/61 | HR 84 | Wt 240.0 lb

## 2013-11-02 DIAGNOSIS — I5032 Chronic diastolic (congestive) heart failure: Secondary | ICD-10-CM | POA: Diagnosis present

## 2013-11-02 DIAGNOSIS — E039 Hypothyroidism, unspecified: Secondary | ICD-10-CM | POA: Diagnosis not present

## 2013-11-02 DIAGNOSIS — I129 Hypertensive chronic kidney disease with stage 1 through stage 4 chronic kidney disease, or unspecified chronic kidney disease: Secondary | ICD-10-CM | POA: Insufficient documentation

## 2013-11-02 DIAGNOSIS — Z794 Long term (current) use of insulin: Secondary | ICD-10-CM | POA: Diagnosis not present

## 2013-11-02 DIAGNOSIS — E119 Type 2 diabetes mellitus without complications: Secondary | ICD-10-CM | POA: Diagnosis not present

## 2013-11-02 DIAGNOSIS — I10 Essential (primary) hypertension: Secondary | ICD-10-CM

## 2013-11-02 DIAGNOSIS — I2510 Atherosclerotic heart disease of native coronary artery without angina pectoris: Secondary | ICD-10-CM | POA: Diagnosis not present

## 2013-11-02 DIAGNOSIS — N183 Chronic kidney disease, stage 3 unspecified: Secondary | ICD-10-CM | POA: Diagnosis not present

## 2013-11-02 DIAGNOSIS — Z8673 Personal history of transient ischemic attack (TIA), and cerebral infarction without residual deficits: Secondary | ICD-10-CM | POA: Diagnosis not present

## 2013-11-02 DIAGNOSIS — E669 Obesity, unspecified: Secondary | ICD-10-CM | POA: Insufficient documentation

## 2013-11-02 DIAGNOSIS — R0602 Shortness of breath: Secondary | ICD-10-CM | POA: Insufficient documentation

## 2013-11-02 DIAGNOSIS — I509 Heart failure, unspecified: Secondary | ICD-10-CM

## 2013-11-02 MED ORDER — POTASSIUM CHLORIDE CRYS ER 20 MEQ PO TBCR: 20.0000 meq | EXTENDED_RELEASE_TABLET | Freq: Three times a day (TID) | ORAL | Status: DC

## 2013-11-02 MED ORDER — TORSEMIDE 20 MG PO TABS
60.0000 mg | ORAL_TABLET | Freq: Two times a day (BID) | ORAL | Status: DC
Start: 2013-11-02 — End: 2013-11-11

## 2013-11-02 NOTE — Patient Instructions (Signed)
Follow up in 2 weeks  Take K dur 20 meq three times a day  Take 60 mg torsemide twice a day  Do the following things EVERYDAY: 1) Weigh yourself in the morning before breakfast. Write it down and keep it in a log. 2) Take your medicines as prescribed 3) Eat low salt foods-Limit salt (sodium) to 2000 mg per day.  4) Stay as active as you can everyday 5) Limit all fluids for the day to less than 2 liters

## 2013-11-02 NOTE — Progress Notes (Signed)
Patient ID: [name], male   DOB: October 16, 1932, 80 y.o.   MRN: [id number]  Oncologist: Dr [name]   HPI:  Mr. Min is a very pleasant 80 year old male with a history of diastolic heart failure as well as 2nd degree heart block (Wenckebach), hypertension, hyperlipidemia, diabetes, previous CVA, and paroxysmal atrial fibrillation on amiodarone. Not on coumadin as felt to be high risk of bleeding due to h/o ETOH. Echo 3/11 EF 60-65% mild MR. Also has history of prostate cancer with prostatectomy 15 year ago and now with lesion on spine not thought to be from cancer.     Admitted to [facility] with with volume overload 9/2 thourgh 10/26/13 in the setting of recent transfusions. Diuresed with IV lasix. Discharge weigh was 238 pounds.   He returns for post hospital follow up. Per [facility] weight up 7-8 pounds due to excessive fluid intake. Drinking extra fluid, > 2 liters per day. Mild dypsne with exertion. + Orthopnea sleeps on 3 pillows.  Sleeping with oxygen at night. He is able to stand and pivot with assistance. Uses motorized wheelchair. Followed by [facility] for HHPT/HHRN. Has follow up with PCP next week. Daughter prepares pill box. Has Nurse Aide daily to assist with meals and ADLs.   Labs 10/26/13 Creatinine 2.07 K 3.5    ROS: All systems negative except as listed in HPI, PMH and Problem List.  Past Medical History  Diagnosis Date  . Chronic diastolic heart failure     secondary to diastolic dysfunction EF (previous EF 35-45%)ef 60%4/09  . Arrhythmia     atrial fibrillation /pt on amiodarone,thought to be poor coumadin  . Stroke   . Other and unspecified hyperlipidemia   . Hypertension   . Hypertrophy of prostate without urinary obstruction and other lower urinary tract symptoms (LUTS)   . Osteoarthrosis, unspecified whether generalized or localized, unspecified site   . Type II or unspecified type diabetes mellitus without mention of complication, not stated as uncontrolled   . AV block, Mobitz 1     Intolerance to ACE's / discontiuation of beta blockers  . Obstructive sleep apnea   . Iron deficiency anemia, unspecified     s/p EGD and coloscopy 3/10.gastritis.hemorrhids  . Cerebrovascular accident   . Renal insufficiency   . Obesity   . Allergic rhinitis, cause unspecified 05/29/2010  . CAD (coronary artery disease)     cath 12/04: mLAD 50-60%, mCFX 20-30%, pRCA 50-60%, mRCA 40%  . Normochromic normocytic anemia 04/21/2013  . Abnormal CXR 04/21/2013  . Hypertensive kidney disease with CKD stage III 04/21/2013  . Prostate cancer     Current Outpatient Prescriptions  Medication Sig Dispense Refill  . allopurinol (ZYLOPRIM) 100 MG tablet Take 1 tablet (100 mg total) by mouth at bedtime.  30 tablet  1  . amiodarone (PACERONE) 200 MG tablet Take 0.5 tablets (100 mg total) by mouth every morning.  15 tablet  6  . amLODipine (NORVASC) 10 MG tablet Take 1 tablet (10 mg total) by mouth daily.  30 tablet  6  . aspirin EC 81 MG EC tablet Take 1 tablet (81 mg total) by mouth daily.      Marland Kitchen atorvastatin (LIPITOR) 40 MG tablet Take 40 mg by mouth daily.      . citalopram (CELEXA) 10 MG tablet Take 20 mg by mouth daily.       . cloNIDine (CATAPRES - DOSED IN MG/24 HR) 0.1 mg/24hr patch Place 1 patch (0.1 mg total) onto the skin once a  week. Changes patch on Thursday's  4 patch  3  . darbepoetin (ARANESP) 300 MCG/0.6ML SOLN injection Inject 300 mcg into the skin every 14 (fourteen) days.      . Fe Fum-Vit C-Vit B12-FA (TRIGELS-F) 460-60-0.01-1 MG CAPS capsule Take 1 capsule by mouth daily.  30 capsule  6  . Fluticasone-Salmeterol (ADVAIR DISKUS) 100-50 MCG/DOSE AEPB Inhale 1 puff into the lungs 2 (two) times daily.  1 each  0  . insulin detemir (LEVEMIR) 100 UNIT/ML injection Inject 0.5 mLs (50 Units total) into the skin every morning.      . isosorbide mononitrate (IMDUR) 60 MG 24 hr tablet Take 1 tablet (60 mg total) by mouth daily.  90 tablet  3  . omeprazole (PRILOSEC) 20 MG capsule Take 20 mg by  mouth daily.      . potassium chloride SA (K-DUR,KLOR-CON) 20 MEQ tablet Take 1 tablet (20 mEq total) by mouth daily.  30 tablet  6  . silver sulfADIAZINE (SILVADENE) 1 % cream Apply topically daily. Topical, Daily, For 21 days Apply to Bilateral LE ulcerations twice daily for three days (Wednesday through Friday) then decrease dressing changes to daily and continue for 21 days.  50 g  0  . torsemide (DEMADEX) 20 MG tablet Take 40 mg by mouth. Take 40 mg in am and alternates 30 mg in the evening then the next day 40 mg in the evening.       No current facility-administered medications for this encounter.     PHYSICAL EXAM: Filed Vitals:   11/02/13 1103  BP: 132/61  Pulse: 84  Weight: 240 lb (108.863 kg)  SpO2: 91%    General:  Chronically illl appearing. No resp difficulty. Arrived in wheelchair with his daughter.  HEENT: normal Neck: supple. JVP ~10 . Carotids 2+ bilaterally; no bruits. No lymphadenopathy or thryomegaly appreciated. Cor: PMI normal. Regular rate & rhythm. No rubs, gallops or murmurs. Lungs: RML RLL LLL crackles.  Abdomen: soft, nontender, nondistended. No hepatosplenomegaly. No bruits or masses. Good bowel sounds. Extremities: no cyanosis, clubbing, rash,  R and LLE 2+ edema RLE and LLE dressing in place.  Neuro: alert & orientedx3, cranial nerves grossly intact. Moves all 4 extremities w/o difficulty. Affect pleasant.      ASSESSMENT & PLAN: 1. Chronic Diastolic Heart Failure . NYHA III. 10/2013 ECHO normal EF Grade III DD Volume status elevated likely due to increased fluid intake at home. Drinking > 2 liters per day.  Increase torsemide to 60 mg twice a day. Increase potassium to 20 meq three times a day. Check BMET next week by [facility]  Reinforced daily weights, low salt food choices, and limiting fluid intake to <2 liters per day.  2. PAF- regular rhythm. Continue amiodarone 100 mg daily not on anticoagulant due to noncomplaince 3. CKD - baseline 1.7-1.9 -  Check BMET next week 4. Hypothyroidism - Per PCP Dr Jenny Reichmann  5. Prostate Cancer - S/P Prostatectomy 15 years ago with questionable lesion on spine  . Followed by Dr [name]    Follow up in 2 weeks to reassess volume status.  CLEGG,AMY NP-C  11:20 AM

## 2013-11-03 ENCOUNTER — Encounter (HOSPITAL_COMMUNITY)
Admission: RE | Admit: 2013-11-03 | Discharge: 2013-11-03 | Disposition: A | Discharge status: Home / Self Care | Payer: Medicare PPO | Source: Ambulatory Visit | Attending: Oncology | Admitting: Oncology

## 2013-11-03 DIAGNOSIS — C61 Malignant neoplasm of prostate: Secondary | ICD-10-CM | POA: Diagnosis present

## 2013-11-03 MED ORDER — TECHNETIUM TC 99M MEDRONATE IV KIT
26.4000 | PACK | Freq: Once | INTRAVENOUS | Status: AC | PRN
Start: 2013-11-03 — End: 2013-11-03
  Administered 2013-11-03: 26.4 via INTRAVENOUS

## 2013-11-04 ENCOUNTER — Ambulatory Visit: Payer: Medicare PPO

## 2013-11-04 ENCOUNTER — Ambulatory Visit (HOSPITAL_BASED_OUTPATIENT_CLINIC_OR_DEPARTMENT_OTHER): Payer: Medicare PPO | Admitting: Oncology

## 2013-11-04 ENCOUNTER — Telehealth: Payer: Self-pay | Admitting: Oncology

## 2013-11-04 ENCOUNTER — Other Ambulatory Visit (HOSPITAL_BASED_OUTPATIENT_CLINIC_OR_DEPARTMENT_OTHER): Payer: Medicare PPO

## 2013-11-04 ENCOUNTER — Encounter: Payer: Self-pay | Admitting: Oncology

## 2013-11-04 VITALS — BP 147/65 | HR 80 | Temp 97.8°F | Resp 18 | Ht 70.0 in | Wt 238.2 lb

## 2013-11-04 DIAGNOSIS — C61 Malignant neoplasm of prostate: Secondary | ICD-10-CM

## 2013-11-04 DIAGNOSIS — Z8546 Personal history of malignant neoplasm of prostate: Secondary | ICD-10-CM

## 2013-11-04 DIAGNOSIS — D649 Anemia, unspecified: Secondary | ICD-10-CM

## 2013-11-04 DIAGNOSIS — D469 Myelodysplastic syndrome, unspecified: Secondary | ICD-10-CM

## 2013-11-04 DIAGNOSIS — I509 Heart failure, unspecified: Secondary | ICD-10-CM

## 2013-11-04 LAB — CBC WITH DIFFERENTIAL/PLATELET
BASO%: 1.3 % (ref 0.0–2.0)
Basophils Absolute: 0.1 10*3/uL (ref 0.0–0.1)
EOS ABS: 0.6 10*3/uL — AB (ref 0.0–0.5)
EOS%: 8.1 % — ABNORMAL HIGH (ref 0.0–7.0)
HCT: 33.6 % — ABNORMAL LOW (ref 38.4–49.9)
HGB: 9.9 g/dL — ABNORMAL LOW (ref 13.0–17.1)
LYMPH%: 7.3 % — ABNORMAL LOW (ref 14.0–49.0)
MCH: 25.1 pg — ABNORMAL LOW (ref 27.2–33.4)
MCHC: 29.6 g/dL — ABNORMAL LOW (ref 32.0–36.0)
MCV: 84.8 fL (ref 79.3–98.0)
MONO#: 0.7 10*3/uL (ref 0.1–0.9)
MONO%: 8.9 % (ref 0.0–14.0)
NEUT#: 5.9 10*3/uL (ref 1.5–6.5)
NEUT%: 74.4 % (ref 39.0–75.0)
Platelets: 454 10*3/uL — ABNORMAL HIGH (ref 140–400)
RBC: 3.97 10*6/uL — AB (ref 4.20–5.82)
RDW: 18.1 % — AB (ref 11.0–14.6)
WBC: 7.9 10*3/uL (ref 4.0–10.3)
lymph#: 0.6 10*3/uL — ABNORMAL LOW (ref 0.9–3.3)

## 2013-11-04 LAB — HOLD TUBE, BLOOD BANK

## 2013-11-04 NOTE — Progress Notes (Signed)
Hematology and Oncology Follow Up Visit  [name] [id number] 09-02-1932 80 y.o. 11/04/2013 10:44 AM [name], MDJohn, Travis Oris, MD   Principle Diagnosis: 80 year old gentleman with multifactorial anemia likely related to myelodysplastic syndrome. He also has an element of anemia of chronic disease as well as iron deficiency anemia. This was diagnosed in March of 2015. He also has questionable sclerotic bone lesions that were biopsied without any specific diagnosis made. He has a remote history of prostate cancer.  Current therapy: Supportive transfusions every 2-3 weeks as needed as well Aranesp 300 mcg every 2 weeks.  Interim History:  [name] presents today for a followup visit with his daughter. Since his last visit, he was hospitalized for congestive heart failure and was discharged on 10/26/2013. He reports improvement in his lower extremity edema but still an issue. He is not reporting any chest pain or shortness of breath. He is able to ambulate with the help of a walker but for the most part his wheelchair bound. He still attends activities at the [facility] on a regular basis. He has not had any falls or illnesses. He has not reported any complications from Aranesp or transfusions. He is not reporting any  difficulty breathing. He is not reporting any back pain shoulder pain or any bone pain at this time. He had not reported any hospitalizations or illnesses. Has not reported any headaches or blurry vision or double vision. Has not reported any nausea or vomiting or abdominal pain. Has not reported any frequency urgency or hesitancy. Rest or view of system is unremarkable.  Medications: I have reviewed the patient's current medications.  Current Outpatient Prescriptions  Medication Sig Dispense Refill  . allopurinol (ZYLOPRIM) 100 MG tablet Take 1 tablet (100 mg total) by mouth at bedtime.  30 tablet  1  . amiodarone (PACERONE) 200 MG tablet Take 0.5 tablets (100 mg total) by  mouth every morning.  15 tablet  6  . amLODipine (NORVASC) 10 MG tablet Take 1 tablet (10 mg total) by mouth daily.  30 tablet  6  . aspirin EC 81 MG EC tablet Take 1 tablet (81 mg total) by mouth daily.      Marland Kitchen atorvastatin (LIPITOR) 40 MG tablet Take 40 mg by mouth daily.      . citalopram (CELEXA) 10 MG tablet Take 20 mg by mouth daily.       . cloNIDine (CATAPRES - DOSED IN MG/24 HR) 0.1 mg/24hr patch Place 1 patch (0.1 mg total) onto the skin once a week. Changes patch on Thursday's  4 patch  3  . darbepoetin (ARANESP) 300 MCG/0.6ML SOLN injection Inject 300 mcg into the skin every 14 (fourteen) days.      . Fe Fum-Vit C-Vit B12-FA (TRIGELS-F) 460-60-0.01-1 MG CAPS capsule Take 1 capsule by mouth daily.  30 capsule  6  . Fluticasone-Salmeterol (ADVAIR DISKUS) 100-50 MCG/DOSE AEPB Inhale 1 puff into the lungs 2 (two) times daily.  1 each  0  . insulin detemir (LEVEMIR) 100 UNIT/ML injection Inject 0.5 mLs (50 Units total) into the skin every morning.      . isosorbide mononitrate (IMDUR) 60 MG 24 hr tablet Take 1 tablet (60 mg total) by mouth daily.  90 tablet  3  . omeprazole (PRILOSEC) 20 MG capsule Take 20 mg by mouth daily.      . potassium chloride SA (K-DUR,KLOR-CON) 20 MEQ tablet Take 1 tablet (20 mEq total) by mouth 3 (three) times daily.  90 tablet  6  . silver sulfADIAZINE (SILVADENE) 1 % cream Apply topically daily. Topical, Daily, For 21 days Apply to Bilateral LE ulcerations twice daily for three days (Wednesday through Friday) then decrease dressing changes to daily and continue for 21 days.  50 g  0  . torsemide (DEMADEX) 20 MG tablet Take 3 tablets (60 mg total) by mouth 2 (two) times daily.  180 tablet  6   No current facility-administered medications for this visit.     Allergies:  Allergies  Allergen Reactions  . Ace Inhibitors Other (See Comments)    me  hyposion  . Beta Adrenergic Blockers Other (See Comments)     use cautiously secondary to 2nd degree heart block,  hypotension    Past Medical History, Surgical history, Social history, and Family History were reviewed and updated.   Physical Exam: Blood pressure 147/65, pulse 80, temperature 97.8 F (36.6 C), temperature source Oral, resp. rate 18, height _0  (1.778 m), weight 238 lb 3.2 oz (108.047 kg), SpO2 94.00%. ECOG: 2 General appearance: alert and cooperative not in any distress. Head: Normocephalic, without obvious abnormality Neck: no adenopathy Lymph nodes: Cervical, supraclavicular, and axillary nodes normal. Heart:regular rate and rhythm, S1, S2 normal, no murmur, click, rub or gallop Lung:chest clear, no wheezing, rales, normal symmetric air entry Abdomin: soft, non-tender, without masses or organomegaly EXT: 1+ edema noted.   Lab Results: Lab Results  Component Value Date   WBC 7.9 11/04/2013   HGB 9.9* 11/04/2013   HCT 33.6* 11/04/2013   MCV 84.8 11/04/2013   PLT 454* 11/04/2013     Chemistry      Component Value Date/Time   NA 141 10/26/2013 0537   NA 142 10/21/2013 1034   K 3.5* 10/26/2013 0537   K 4.1 10/21/2013 1034   CL 105 10/26/2013 0537   CO2 24 10/26/2013 0537   CO2 22 10/21/2013 1034   BUN 37* 10/26/2013 0537   BUN 45.8* 10/21/2013 1034   CREATININE 2.01* 10/26/2013 0537   CREATININE 2.3* 10/21/2013 1034   CREATININE 2.67* 04/22/2013 1801      Component Value Date/Time   CALCIUM 8.9 10/26/2013 0537   CALCIUM 8.7 10/21/2013 1034   ALKPHOS 75 10/22/2013 0112   ALKPHOS 73 10/21/2013 1034   AST 14 10/22/2013 0112   AST 16 10/21/2013 1034   ALT 13 10/22/2013 0112   ALT 17 10/21/2013 1034   BILITOT 0.3 10/22/2013 0112   BILITOT <0.20 10/21/2013 1034     COMPARISON: Multiple exams, including 04/24/2013  FINDINGS:  The patient has known blastic metastatic lesions in the spine and  pelvis on CT examination, surprisingly poorly seen on bone scan.  Faintly increased activity at L2, T9, and T10 are compatible with  some of the sclerotic metastatic lesions in this vicinity but these  are not as  conspicuous as is often encountered on bone scan. The  pelvic lesions are not readily visible at all.  Degenerative findings along the knees and shoulders. Degenerative  findings at the sternoclavicular joints.  IMPRESSION:  1. The known metastatic lesions in the thoracic and lumbar spine are  only faintly apparent. The pelvic lesions are not readily apparent  at all. For this particular patient, bone scan seems relatively  insensitive for the patient's known metastatic lesions.    Impression and Plan:  80 year old gentleman with the following issues:   1. Multifactorial anemia: He has an element of anemia of renal disease as well as iron deficiency anemia. He continues to  be transfusion dependent despite Aransep, iron supplement and other interventions. His bone marrow biopsy completed on 09/09/2013 suggesting myelodysplastic syndrome. He did have a small focus of metastatic carcinoma likely from a prostate cancer involvement but really no diffuse infiltration of the bone marrow. It appears that his anemia is likely related to myelodysplasia without any evidence of acute leukemia or a massive infiltration of a solid malignancy. For the time being, we will continue supportive measures with back red cell transfusions every 2 weeks. His hemoglobin today is adequate and does not require any transfusions.   2. History of prostate cancer: He is status post prostatectomy close to 15 years ago.Now he has questionable sclerotic lesions with a PSA of around 1.89 and that was repeated and it was down to 1.09. It is unclear to me whether his bone lesions are indeed metastatic prostate cancer as his PSA continue to be very low and these lesions are not very obvious on the bone scan. We'll continue to monitor this for the time being.  3. Congestive heart failure: This was initially given the frequent transfusions he is receiving. He will receive IV Lasix after each unit of packed red cell transfusion in the  future.   4. Followup: He will continue to follow every 2 weeks with PRBCs transfusions and clinical visit in 6 weeks.      [facility], MD 9/16/201510:44 AM

## 2013-11-04 NOTE — Telephone Encounter (Signed)
gv and printed appt sched and avs for pt for Sept and OCT....sed added tx °

## 2013-11-04 NOTE — Progress Notes (Signed)
Encounter opened in error. Hbg-9.9, will not be transfused today per [place] RN with [name].

## 2013-11-04 NOTE — Telephone Encounter (Signed)
gv adn pritned appt sched and avs for pt for Sept and OCT °

## 2013-11-09 ENCOUNTER — Telehealth: Payer: Self-pay

## 2013-11-09 NOTE — Telephone Encounter (Signed)
[name] stated:   O2 sat is down to 88%. O2 has been added at night only and will maintain 94% with O2.  Orders are needed to keep home therapy for 3 more weeks @ 2 x week.

## 2013-11-09 NOTE — Telephone Encounter (Signed)
Ok for verbal if this is ok 

## 2013-11-10 NOTE — Telephone Encounter (Signed)
HHRN informed 

## 2013-11-11 ENCOUNTER — Ambulatory Visit (INDEPENDENT_AMBULATORY_CARE_PROVIDER_SITE_OTHER): Payer: Medicare PPO | Admitting: Internal Medicine

## 2013-11-11 ENCOUNTER — Encounter: Payer: Self-pay | Admitting: Internal Medicine

## 2013-11-11 ENCOUNTER — Telehealth (HOSPITAL_COMMUNITY): Payer: Self-pay

## 2013-11-11 VITALS — BP 132/62 | HR 76 | Temp 98.3°F | Wt 240.0 lb

## 2013-11-11 DIAGNOSIS — I10 Essential (primary) hypertension: Secondary | ICD-10-CM

## 2013-11-11 DIAGNOSIS — E038 Other specified hypothyroidism: Secondary | ICD-10-CM

## 2013-11-11 DIAGNOSIS — N183 Chronic kidney disease, stage 3 unspecified: Secondary | ICD-10-CM

## 2013-11-11 DIAGNOSIS — I129 Hypertensive chronic kidney disease with stage 1 through stage 4 chronic kidney disease, or unspecified chronic kidney disease: Secondary | ICD-10-CM

## 2013-11-11 DIAGNOSIS — IMO0001 Reserved for inherently not codable concepts without codable children: Secondary | ICD-10-CM

## 2013-11-11 DIAGNOSIS — E1165 Type 2 diabetes mellitus with hyperglycemia: Secondary | ICD-10-CM

## 2013-11-11 DIAGNOSIS — Z23 Encounter for immunization: Secondary | ICD-10-CM

## 2013-11-11 MED ORDER — TORSEMIDE 20 MG PO TABS: ORAL_TABLET | ORAL | Status: DC

## 2013-11-11 MED ORDER — POTASSIUM CHLORIDE CRYS ER 20 MEQ PO TBCR: 20.0000 meq | EXTENDED_RELEASE_TABLET | Freq: Two times a day (BID) | ORAL | Status: DC

## 2013-11-11 NOTE — Addendum Note (Signed)
Addended by: Sharon Seller B on: 11/11/2013 11:11 AM   Modules accepted: Orders

## 2013-11-11 NOTE — Progress Notes (Signed)
Pre visit review using our clinic review tool, if applicable. No additional management support is needed unless otherwise documented below in the visit note. 

## 2013-11-11 NOTE — Assessment & Plan Note (Signed)
stable overall by history and exam, recent data reviewed with pt, and pt to continue medical treatment as before,  to f/u any worsening symptoms or concerns Lab Results  Component Value Date   TSH 2.540 10/22/2013

## 2013-11-11 NOTE — Patient Instructions (Addendum)
You had the new Prevnar pneumonia shot today  Please continue all other medications as before, and refills have been done if requested.  Please have the pharmacy call with any other refills you may need.  Please continue your efforts at being more active, low cholesterol diet, and weight control.  You are otherwise up to date with prevention measures today.  Please keep your appointments with your specialists as you may have planned  You are given the order for the Lift Chair (to [place])

## 2013-11-11 NOTE — Assessment & Plan Note (Signed)
stable overall by history and exam, recent data reviewed with pt, and pt to continue medical treatment as before,  to f/u any worsening symptoms or concerns Lab Results  Component Value Date   WBC 7.9 11/04/2013   HGB 9.9* 11/04/2013   HCT 33.6* 11/04/2013   PLT 454* 11/04/2013   GLUCOSE 127* 10/26/2013   CHOL 110 07/09/2013   TRIG 124.0 07/09/2013   HDL 34.70* 07/09/2013   LDLDIRECT 83.9 09/19/2009   LDLCALC 51 07/09/2013   ALT 13 10/22/2013   AST 14 10/22/2013   NA 141 10/26/2013   K 3.5* 10/26/2013   CL 105 10/26/2013   CREATININE 2.01* 10/26/2013   BUN 37* 10/26/2013   CO2 24 10/26/2013   TSH 2.540 10/22/2013   PSA 1.09 10/21/2013   INR 1.08 10/22/2013   HGBA1C 5.8* 10/22/2013   MICROALBUR 56.6* 11/27/2010

## 2013-11-11 NOTE — Assessment & Plan Note (Signed)
stable overall by history and exam, recent data reviewed with pt, and pt to continue medical treatment as before,  to f/u any worsening symptoms or concerns BP Readings from Last 3 Encounters:  11/11/13 132/62  11/04/13 147/65  11/02/13 132/61

## 2013-11-11 NOTE — Telephone Encounter (Signed)
Lab results reviewed with patient's daughter who manages patient's medications and appointments.  Instructed to decrease torsemide to 60mg  q am and 40mg  q pm, and decrease potassium to 20 meq BID.  Aware and agreeable.  BMET added on to next week's appointment with our clinic. Renee Pain

## 2013-11-11 NOTE — Progress Notes (Signed)
Subjective:    Patient ID: [name], male    DOB: [birth year], 80 y.o.   MRN: [id number]  HPI  Here tof/u, recently hospn diast CHF with anasarca/ascites/severe LE edema/left ankle stasis ulcer; overall much improved, Overall good compliance with treatment, and good medicine tolerability.  Pt denies chest pain, increased sob or doe, wheezing, orthopnea, PND, increased LE swelling, palpitations, dizziness or syncope.  Wt overall stable at home and here.   Wt Readings from Last 3 Encounters:  11/11/13 240 lb (108.863 kg)  11/04/13 238 lb 3.2 oz (108.047 kg)  11/02/13 240 lb (108.863 kg)  Has been seen per CHF clinic, now on 60 bid torsemide, with close f/u at 2 wks.  Also sees Dr Shadad/oncology with multifact anemia, several recent PRBC transfusions with last HGB more stable 9.9 , also MDS, and ? Of abnormal bone scan, also hx of prostate ca.  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  Denies hyper or hypo thyroid symptoms such as voice, skin or hair change.  Pt denies new neurological symptoms such as new headache, or facial or extremity weakness or numbness   Pt denies fever, wt loss, night sweats, loss of appetite, or other constitutional symptoms, though son with him appears to have URI today with freq cough.  Pt needs order for recliner chair/lift chair. No other complaints.  Gets leg wraps changed twice weekly - left ulcer reportedly improving, not yet healed. Due prevnar today. / Pt denies polydipsia, polyuria, or low sugar symptoms such as weakness or confusion improved with po intake  Past Medical History  Diagnosis Date  . Chronic diastolic heart failure     secondary to diastolic dysfunction EF (previous EF 35-45%)ef 60%4/09  . Arrhythmia     atrial fibrillation /pt on amiodarone,thought to be poor coumadin  . Stroke   . Other and unspecified hyperlipidemia   . Hypertension   . Hypertrophy of prostate without urinary obstruction  and other lower urinary tract symptoms (LUTS)   . Osteoarthrosis, unspecified whether generalized or localized, unspecified site   . Type II or unspecified type diabetes mellitus without mention of complication, not stated as uncontrolled   . AV block, Mobitz 1     Intolerance to ACE's / discontiuation of beta blockers  . Obstructive sleep apnea   . Iron deficiency anemia, unspecified     s/p EGD and coloscopy 3/10.gastritis.hemorrhids  . Cerebrovascular accident   . Renal insufficiency   . Obesity   . Allergic rhinitis, cause unspecified 05/29/2010  . CAD (coronary artery disease)     cath 12/04: mLAD 50-60%, mCFX 20-30%, pRCA 50-60%, mRCA 40%  . Normochromic normocytic anemia 04/21/2013  . Abnormal CXR 04/21/2013  . Hypertensive kidney disease with CKD stage III 04/21/2013  . Prostate cancer    Past Surgical History  Procedure Laterality Date  . Prostate surgery    . Prostate surgury      hx of  . Esophagogastroduodenoscopy N/A 04/21/2013    Procedure: ESOPHAGOGASTRODUODENOSCOPY (EGD);  Surgeon: Irene Shipper, MD;  Location: [facility];  Service: Endoscopy;  Laterality: N/A;    reports that he has quit smoking. He has never used smokeless tobacco. He reports that he does not drink alcohol or use illicit drugs. family history includes CAD in his sister; Dementia in his mother; Heart disease in his father. Allergies  Allergen Reactions  . Ace Inhibitors Other (See Comments)    me  hyposion  . Beta Adrenergic Blockers Other (See Comments)     use cautiously secondary to 2nd degree heart block, hypotension   Current Outpatient Prescriptions on File Prior to Visit  Medication Sig Dispense Refill  . allopurinol (ZYLOPRIM) 100 MG tablet Take 1 tablet (100 mg total) by mouth at bedtime.  30 tablet  1  . amiodarone (PACERONE) 200 MG tablet Take 0.5 tablets (100 mg total) by mouth every morning.  15 tablet  6  . amLODipine (NORVASC) 10 MG tablet Take 1 tablet (10 mg total) by mouth daily.  30  tablet  6  . aspirin EC 81 MG EC tablet Take 1 tablet (81 mg total) by mouth daily.      Marland Kitchen atorvastatin (LIPITOR) 40 MG tablet Take 40 mg by mouth daily.      . citalopram (CELEXA) 10 MG tablet Take 20 mg by mouth daily.       . cloNIDine (CATAPRES - DOSED IN MG/24 HR) 0.1 mg/24hr patch Place 1 patch (0.1 mg total) onto the skin once a week. Changes patch on Thursday's  4 patch  3  . darbepoetin (ARANESP) 300 MCG/0.6ML SOLN injection Inject 300 mcg into the skin every 14 (fourteen) days.      . Fe Fum-Vit C-Vit B12-FA (TRIGELS-F) 460-60-0.01-1 MG CAPS capsule Take 1 capsule by mouth daily.  30 capsule  6  . Fluticasone-Salmeterol (ADVAIR DISKUS) 100-50 MCG/DOSE AEPB Inhale 1 puff into the lungs 2 (two) times daily.  1 each  0  . insulin detemir (LEVEMIR) 100 UNIT/ML injection Inject 0.5 mLs (50 Units total) into the skin every morning.      . isosorbide mononitrate (IMDUR) 60 MG 24 hr tablet Take 1 tablet (60 mg total) by mouth daily.  90 tablet  3  . omeprazole (PRILOSEC) 20 MG capsule Take 20 mg by mouth daily.      . silver sulfADIAZINE (SILVADENE) 1 % cream Apply topically daily. Topical, Daily, For 21 days Apply to Bilateral LE ulcerations twice daily for three days (Wednesday through Friday) then decrease dressing changes to daily and continue for 21 days.  50 g  0   No current facility-administered medications on file prior to visit.    Review of Systems  Constitutional: Negative for unusual diaphoresis or other sweats  HENT: Negative for ringing in ear Eyes: Negative for double vision or worsening visual disturbance.  Respiratory: Negative for choking and stridor.   Gastrointestinal: Negative for vomiting or other signifcant bowel change Genitourinary: Negative for hematuria or decreased urine volume.  Musculoskeletal: Negative for other MSK pain or swelling Skin: Negative for color change and worsening wound.  Neurological: Negative for tremors and numbness other than noted    Psychiatric/Behavioral: Negative for decreased concentration or agitation other than above       Objective:   Physical Exam BP 132/62  Pulse 76  Temp(Src) 98.3 F (36.8 C) (Oral)  Wt 240 lb (108.863 kg)  SpO2 92% VS noted,  Constitutional: Pt appears well-developed, well-nourished.  HENT: Head: NCAT.  Right Ear: External ear normal.  Left Ear: External ear normal.  Eyes: . Pupils are equal, round, and reactive to light. Conjunctivae and EOM are normal Neck: Normal range of motion. Neck supple.  Cardiovascular: Normal rate and regular rhythm.   Pulmonary/Chest: Effort normal and breath sounds normal.  Abd:  Soft, NT, ND, + BS, + ascites, nontense Neurological: Pt is alert. Not increased confused , motor grossly intact Skin: Skin is warm. No rash, has  bilat leg wraps to mid leg not undone today, trace to 1+ edema bilat persists Psychiatric: Pt behavior is normal. No agitation.     Assessment & Plan:

## 2013-11-11 NOTE — Assessment & Plan Note (Addendum)
stable overall by history and exam, recent data reviewed with pt, and pt to continue medical treatment as before,  to f/u any worsening symptoms or concerns Lab Results  Component Value Date   HGBA1C 5.8* 10/22/2013   Note:  Total time for pt hx, exam, review of record with pt in the room, determination of diagnoses and plan for further eval and tx is > 40 min, with over 50% spent in coordination and counseling of patient

## 2013-11-18 ENCOUNTER — Telehealth: Payer: Self-pay | Admitting: *Deleted

## 2013-11-18 ENCOUNTER — Encounter: Payer: Self-pay | Admitting: *Deleted

## 2013-11-18 ENCOUNTER — Other Ambulatory Visit (HOSPITAL_BASED_OUTPATIENT_CLINIC_OR_DEPARTMENT_OTHER): Payer: Medicare PPO

## 2013-11-18 DIAGNOSIS — D469 Myelodysplastic syndrome, unspecified: Secondary | ICD-10-CM

## 2013-11-18 LAB — CBC WITH DIFFERENTIAL/PLATELET
BASO%: 0.1 % (ref 0.0–2.0)
BASOS ABS: 0 10*3/uL (ref 0.0–0.1)
EOS ABS: 0.5 10*3/uL (ref 0.0–0.5)
EOS%: 7.7 % — ABNORMAL HIGH (ref 0.0–7.0)
HCT: 32.1 % — ABNORMAL LOW (ref 38.4–49.9)
HEMOGLOBIN: 9.6 g/dL — AB (ref 13.0–17.1)
LYMPH#: 0.6 10*3/uL — AB (ref 0.9–3.3)
LYMPH%: 8.3 % — ABNORMAL LOW (ref 14.0–49.0)
MCH: 24.9 pg — ABNORMAL LOW (ref 27.2–33.4)
MCHC: 30.1 g/dL — ABNORMAL LOW (ref 32.0–36.0)
MCV: 82.9 fL (ref 79.3–98.0)
MONO#: 0.7 10*3/uL (ref 0.1–0.9)
MONO%: 10.3 % (ref 0.0–14.0)
NEUT%: 73.6 % (ref 39.0–75.0)
NEUTROS ABS: 5 10*3/uL (ref 1.5–6.5)
Platelets: 339 10*3/uL (ref 140–400)
RBC: 3.87 10*6/uL — AB (ref 4.20–5.82)
RDW: 18.8 % — ABNORMAL HIGH (ref 11.0–14.6)
WBC: 6.8 10*3/uL (ref 4.0–10.3)

## 2013-11-18 LAB — DRAW EXTRA CLOT TUBE

## 2013-11-18 LAB — HOLD TUBE, BLOOD BANK

## 2013-11-18 NOTE — Telephone Encounter (Signed)
Left msg on triage stating faxed over a medical necessity form on 11/13/13 for commode & 4 wheelchair. Requesting order to be fax back ASAP @ [phone]. If md did not received pls call back...[name]

## 2013-11-19 ENCOUNTER — Ambulatory Visit (HOSPITAL_COMMUNITY)
Admission: RE | Admit: 2013-11-19 | Discharge: 2013-11-19 | Disposition: A | Discharge status: Home / Self Care | Payer: Medicare PPO | Source: Ambulatory Visit | Attending: Oncology | Admitting: Oncology

## 2013-11-19 ENCOUNTER — Inpatient Hospital Stay (HOSPITAL_COMMUNITY): Admission: RE | Admit: 2013-11-19 | Payer: Medicare PPO | Source: Ambulatory Visit

## 2013-11-19 NOTE — Telephone Encounter (Signed)
I do not recall signing it or not, and I do not currently have any paperwork  OK to re-send, unless this could be checked with [facility] to see if they already have it

## 2013-11-19 NOTE — Telephone Encounter (Signed)
Called christy back spoke with beth gave her md response below. Eustaquio Maize will re-fax form to [place]...[name]

## 2013-11-25 ENCOUNTER — Telehealth: Payer: Self-pay | Admitting: Internal Medicine

## 2013-11-25 ENCOUNTER — Encounter: Payer: Self-pay | Admitting: Internal Medicine

## 2013-11-25 NOTE — Telephone Encounter (Signed)
Beth PT Assistant from [facility] called concern for patient, he was feeling bad, had pain under right rib cage, vital was good but when he walk abou 50 feet he start to feel pain again, only goes away with rest, oxygen level goes down when walking, she has to put him back on oxygen,. please advise. Call [phone] ask for [facility]

## 2013-11-25 NOTE — Telephone Encounter (Signed)
[employer]  Informed of MD instructions.  She stated she would inform the family.

## 2013-11-25 NOTE — Telephone Encounter (Signed)
Hard to say the etiology based on this, would seem more MSK just by the description, but cant r/o pna or even heart disease  Please consider ER visit now especially if now having to use oxygen

## 2013-11-27 NOTE — Telephone Encounter (Signed)
HHRN called back today 11/27/13.   The patient did not go to the ER as instructed by PCP.Marland Kitchen She went to the home today when she arrived oxygen at rest was 80% then once on oxygen went to 90% then with deep breathing went up to 97%.  With exertion it dropped to 90% and SOB.   HHRN is concerned he may have pneumonia.  They could accept a verbal order for a portable xray to check for pneumonia.  PCP did give verbal ok for portable xray for this patient.  Will fax over note to [facility] at [phone]

## 2013-11-28 ENCOUNTER — Other Ambulatory Visit: Payer: Self-pay

## 2013-11-28 ENCOUNTER — Emergency Department (HOSPITAL_COMMUNITY): Payer: Medicare PPO

## 2013-11-28 ENCOUNTER — Encounter (HOSPITAL_COMMUNITY): Payer: Self-pay | Admitting: Emergency Medicine

## 2013-11-28 ENCOUNTER — Telehealth: Payer: Self-pay | Admitting: General Practice

## 2013-11-28 ENCOUNTER — Inpatient Hospital Stay (HOSPITAL_COMMUNITY)
Admission: EM | Admit: 2013-11-28 | Discharge: 2013-11-30 | DRG: 291 | Disposition: A | Discharge status: Home Health | Payer: Medicare PPO | Attending: Family Medicine | Admitting: Family Medicine

## 2013-11-28 DIAGNOSIS — D631 Anemia in chronic kidney disease: Secondary | ICD-10-CM | POA: Diagnosis present

## 2013-11-28 DIAGNOSIS — I5033 Acute on chronic diastolic (congestive) heart failure: Principal | ICD-10-CM | POA: Diagnosis present

## 2013-11-28 DIAGNOSIS — M109 Gout, unspecified: Secondary | ICD-10-CM | POA: Diagnosis present

## 2013-11-28 DIAGNOSIS — E669 Obesity, unspecified: Secondary | ICD-10-CM | POA: Diagnosis present

## 2013-11-28 DIAGNOSIS — R609 Edema, unspecified: Secondary | ICD-10-CM | POA: Diagnosis present

## 2013-11-28 DIAGNOSIS — Z7982 Long term (current) use of aspirin: Secondary | ICD-10-CM | POA: Diagnosis not present

## 2013-11-28 DIAGNOSIS — C7951 Secondary malignant neoplasm of bone: Secondary | ICD-10-CM | POA: Diagnosis present

## 2013-11-28 DIAGNOSIS — I480 Paroxysmal atrial fibrillation: Secondary | ICD-10-CM | POA: Diagnosis present

## 2013-11-28 DIAGNOSIS — C61 Malignant neoplasm of prostate: Secondary | ICD-10-CM

## 2013-11-28 DIAGNOSIS — I129 Hypertensive chronic kidney disease with stage 1 through stage 4 chronic kidney disease, or unspecified chronic kidney disease: Secondary | ICD-10-CM | POA: Diagnosis present

## 2013-11-28 DIAGNOSIS — Z794 Long term (current) use of insulin: Secondary | ICD-10-CM | POA: Diagnosis not present

## 2013-11-28 DIAGNOSIS — Z6832 Body mass index (BMI) 32.0-32.9, adult: Secondary | ICD-10-CM

## 2013-11-28 DIAGNOSIS — C7952 Secondary malignant neoplasm of bone marrow: Secondary | ICD-10-CM

## 2013-11-28 DIAGNOSIS — Z79899 Other long term (current) drug therapy: Secondary | ICD-10-CM | POA: Diagnosis not present

## 2013-11-28 DIAGNOSIS — Z9079 Acquired absence of other genital organ(s): Secondary | ICD-10-CM | POA: Diagnosis present

## 2013-11-28 DIAGNOSIS — Z9981 Dependence on supplemental oxygen: Secondary | ICD-10-CM | POA: Diagnosis not present

## 2013-11-28 DIAGNOSIS — J9621 Acute and chronic respiratory failure with hypoxia: Secondary | ICD-10-CM | POA: Diagnosis present

## 2013-11-28 DIAGNOSIS — E785 Hyperlipidemia, unspecified: Secondary | ICD-10-CM | POA: Diagnosis present

## 2013-11-28 DIAGNOSIS — Z8546 Personal history of malignant neoplasm of prostate: Secondary | ICD-10-CM | POA: Diagnosis present

## 2013-11-28 DIAGNOSIS — J189 Pneumonia, unspecified organism: Secondary | ICD-10-CM | POA: Diagnosis present

## 2013-11-28 DIAGNOSIS — M1990 Unspecified osteoarthritis, unspecified site: Secondary | ICD-10-CM | POA: Diagnosis present

## 2013-11-28 DIAGNOSIS — Z66 Do not resuscitate: Secondary | ICD-10-CM | POA: Diagnosis present

## 2013-11-28 DIAGNOSIS — L97329 Non-pressure chronic ulcer of left ankle with unspecified severity: Secondary | ICD-10-CM | POA: Diagnosis present

## 2013-11-28 DIAGNOSIS — I2510 Atherosclerotic heart disease of native coronary artery without angina pectoris: Secondary | ICD-10-CM | POA: Diagnosis present

## 2013-11-28 DIAGNOSIS — E1122 Type 2 diabetes mellitus with diabetic chronic kidney disease: Secondary | ICD-10-CM

## 2013-11-28 DIAGNOSIS — N183 Chronic kidney disease, stage 3 unspecified: Secondary | ICD-10-CM | POA: Diagnosis present

## 2013-11-28 DIAGNOSIS — Y95 Nosocomial condition: Secondary | ICD-10-CM | POA: Diagnosis present

## 2013-11-28 DIAGNOSIS — Z7409 Other reduced mobility: Secondary | ICD-10-CM | POA: Diagnosis present

## 2013-11-28 DIAGNOSIS — R0600 Dyspnea, unspecified: Secondary | ICD-10-CM | POA: Diagnosis not present

## 2013-11-28 DIAGNOSIS — Z8673 Personal history of transient ischemic attack (TIA), and cerebral infarction without residual deficits: Secondary | ICD-10-CM

## 2013-11-28 DIAGNOSIS — E1129 Type 2 diabetes mellitus with other diabetic kidney complication: Secondary | ICD-10-CM | POA: Diagnosis present

## 2013-11-28 DIAGNOSIS — G4733 Obstructive sleep apnea (adult) (pediatric): Secondary | ICD-10-CM | POA: Diagnosis present

## 2013-11-28 DIAGNOSIS — Z87891 Personal history of nicotine dependence: Secondary | ICD-10-CM

## 2013-11-28 DIAGNOSIS — I10 Essential (primary) hypertension: Secondary | ICD-10-CM | POA: Diagnosis present

## 2013-11-28 DIAGNOSIS — D638 Anemia in other chronic diseases classified elsewhere: Secondary | ICD-10-CM | POA: Diagnosis present

## 2013-11-28 DIAGNOSIS — I83023 Varicose veins of left lower extremity with ulcer of ankle: Secondary | ICD-10-CM | POA: Diagnosis present

## 2013-11-28 LAB — I-STAT TROPONIN, ED: TROPONIN I, POC: 0.03 ng/mL (ref 0.00–0.08)

## 2013-11-28 LAB — BASIC METABOLIC PANEL
ANION GAP: 14 (ref 5–15)
BUN: 62 mg/dL — ABNORMAL HIGH (ref 6–23)
CHLORIDE: 103 meq/L (ref 96–112)
CO2: 22 mEq/L (ref 19–32)
Calcium: 9.1 mg/dL (ref 8.4–10.5)
Creatinine, Ser: 2.24 mg/dL — ABNORMAL HIGH (ref 0.50–1.35)
GFR calc non Af Amer: 26 mL/min — ABNORMAL LOW (ref >90)
GFR, EST AFRICAN AMERICAN: 30 mL/min — AB (ref >90)
Glucose, Bld: 125 mg/dL — ABNORMAL HIGH (ref 70–99)
Potassium: 4.8 mEq/L (ref 3.7–5.3)
Sodium: 139 mEq/L (ref 137–147)

## 2013-11-28 LAB — CBC
HCT: 32.8 % — ABNORMAL LOW (ref 39.0–52.0)
Hemoglobin: 9.7 g/dL — ABNORMAL LOW (ref 13.0–17.0)
MCH: 24.6 pg — ABNORMAL LOW (ref 26.0–34.0)
MCHC: 29.6 g/dL — ABNORMAL LOW (ref 30.0–36.0)
MCV: 83 fL (ref 78.0–100.0)
Platelets: 332 10*3/uL (ref 150–400)
RBC: 3.95 MIL/uL — AB (ref 4.22–5.81)
RDW: 17.1 % — ABNORMAL HIGH (ref 11.5–15.5)
WBC: 8 10*3/uL (ref 4.0–10.5)

## 2013-11-28 LAB — HEPATIC FUNCTION PANEL
ALBUMIN: 3 g/dL — AB (ref 3.5–5.2)
ALT: 18 U/L (ref 0–53)
AST: 23 U/L (ref 0–37)
Alkaline Phosphatase: 85 U/L (ref 39–117)
Total Bilirubin: 0.3 mg/dL (ref 0.3–1.2)
Total Protein: 7.8 g/dL (ref 6.0–8.3)

## 2013-11-28 LAB — PRO B NATRIURETIC PEPTIDE: Pro B Natriuretic peptide (BNP): 1597 pg/mL — ABNORMAL HIGH (ref 0–450)

## 2013-11-28 LAB — GLUCOSE, CAPILLARY: Glucose-Capillary: 149 mg/dL — ABNORMAL HIGH (ref 70–99)

## 2013-11-28 LAB — TROPONIN I: Troponin I: <0.3 ng/mL (ref <0.30)

## 2013-11-28 LAB — HIV ANTIBODY (ROUTINE TESTING W REFLEX): HIV: NONREACTIVE

## 2013-11-28 LAB — STREP PNEUMONIAE URINARY ANTIGEN: STREP PNEUMO URINARY ANTIGEN: NEGATIVE

## 2013-11-28 MED ORDER — ALLOPURINOL 100 MG PO TABS
100.0000 mg | ORAL_TABLET | Freq: Every day | ORAL | Status: DC
  Administered 2013-11-28 – 2013-11-29 (×2): 100 mg via ORAL
  Filled 2013-11-28 (×2): qty 1

## 2013-11-28 MED ORDER — FUROSEMIDE 10 MG/ML IJ SOLN
60.0000 mg | Freq: Once | INTRAMUSCULAR | Status: AC
  Administered 2013-11-28: 60 mg via INTRAVENOUS
  Filled 2013-11-28: qty 8

## 2013-11-28 MED ORDER — CEFEPIME HCL 1 G IJ SOLR
1.0000 g | Freq: Two times a day (BID) | INTRAMUSCULAR | Status: DC
  Administered 2013-11-28 – 2013-11-30 (×4): 1 g via INTRAVENOUS
  Filled 2013-11-28 (×4): qty 1

## 2013-11-28 MED ORDER — SODIUM CHLORIDE 0.9 % IJ SOLN
3.0000 mL | Freq: Two times a day (BID) | INTRAMUSCULAR | Status: DC
  Administered 2013-11-28 – 2013-11-29 (×4): 3 mL via INTRAVENOUS

## 2013-11-28 MED ORDER — INSULIN DETEMIR 100 UNIT/ML ~~LOC~~ SOLN
100.0000 [IU] | Freq: Every day | SUBCUTANEOUS | Status: DC
  Administered 2013-11-29 – 2013-11-30 (×2): 100 [IU] via SUBCUTANEOUS
  Filled 2013-11-28 (×2): qty 1

## 2013-11-28 MED ORDER — INSULIN ASPART 100 UNIT/ML ~~LOC~~ SOLN
0.0000 [IU] | Freq: Three times a day (TID) | SUBCUTANEOUS | Status: DC
  Administered 2013-11-28: 2 [IU] via SUBCUTANEOUS
  Administered 2013-11-29: 3 [IU] via SUBCUTANEOUS
  Administered 2013-11-29: 2 [IU] via SUBCUTANEOUS
  Administered 2013-11-30: 3 [IU] via SUBCUTANEOUS
  Administered 2013-11-30: 2 [IU] via SUBCUTANEOUS

## 2013-11-28 MED ORDER — SODIUM CHLORIDE 0.9 % IV SOLN
2000.0000 mg | Freq: Once | INTRAVENOUS | Status: AC
  Administered 2013-11-28: 2000 mg via INTRAVENOUS
  Filled 2013-11-28: qty 2000

## 2013-11-28 MED ORDER — ISOSORBIDE MONONITRATE ER 60 MG PO TB24
60.0000 mg | ORAL_TABLET | Freq: Every day | ORAL | Status: DC
  Administered 2013-11-29 – 2013-11-30 (×2): 60 mg via ORAL
  Filled 2013-11-28 (×2): qty 1

## 2013-11-28 MED ORDER — ATORVASTATIN CALCIUM 40 MG PO TABS
40.0000 mg | ORAL_TABLET | Freq: Every day | ORAL | Status: DC
  Administered 2013-11-29 – 2013-11-30 (×2): 40 mg via ORAL
  Filled 2013-11-28 (×3): qty 1

## 2013-11-28 MED ORDER — VANCOMYCIN HCL 10 G IV SOLR
1500.0000 mg | INTRAVENOUS | Status: DC
  Filled 2013-11-28: qty 1500

## 2013-11-28 MED ORDER — ENOXAPARIN SODIUM 40 MG/0.4ML ~~LOC~~ SOLN
40.0000 mg | SUBCUTANEOUS | Status: DC
  Administered 2013-11-28 – 2013-11-29 (×2): 40 mg via SUBCUTANEOUS
  Filled 2013-11-28 (×3): qty 0.4

## 2013-11-28 MED ORDER — ASPIRIN EC 325 MG PO TBEC
325.0000 mg | DELAYED_RELEASE_TABLET | Freq: Every day | ORAL | Status: DC
  Administered 2013-11-29 – 2013-11-30 (×2): 325 mg via ORAL
  Filled 2013-11-28 (×2): qty 1

## 2013-11-28 MED ORDER — DEXTROSE 5 % IV SOLN
1.0000 g | Freq: Three times a day (TID) | INTRAVENOUS | Status: DC
  Filled 2013-11-28 (×2): qty 1

## 2013-11-28 MED ORDER — TAB-A-VITE/IRON PO TABS
1.0000 | ORAL_TABLET | Freq: Every day | ORAL | Status: DC
  Administered 2013-11-29 – 2013-11-30 (×2): 1 via ORAL
  Filled 2013-11-28 (×2): qty 1

## 2013-11-28 MED ORDER — DEXTROSE 5 % IV SOLN
1.0000 g | Freq: Three times a day (TID) | INTRAVENOUS | Status: DC
  Filled 2013-11-28: qty 1

## 2013-11-28 MED ORDER — TORSEMIDE 20 MG PO TABS
80.0000 mg | ORAL_TABLET | Freq: Two times a day (BID) | ORAL | Status: DC
  Administered 2013-11-28 – 2013-11-30 (×4): 80 mg via ORAL
  Filled 2013-11-28 (×5): qty 4

## 2013-11-28 MED ORDER — INSULIN ASPART 100 UNIT/ML ~~LOC~~ SOLN: 0.0000 [IU] | Freq: Every day | SUBCUTANEOUS | Status: DC

## 2013-11-28 MED ORDER — SODIUM CHLORIDE 0.9 % IV SOLN: 250.0000 mL | INTRAVENOUS | Status: DC | PRN

## 2013-11-28 MED ORDER — PANTOPRAZOLE SODIUM 40 MG PO TBEC
40.0000 mg | DELAYED_RELEASE_TABLET | Freq: Every day | ORAL | Status: DC
  Administered 2013-11-28 – 2013-11-30 (×3): 40 mg via ORAL
  Filled 2013-11-28 (×3): qty 1

## 2013-11-28 MED ORDER — MOMETASONE FURO-FORMOTEROL FUM 100-5 MCG/ACT IN AERO
2.0000 | INHALATION_SPRAY | Freq: Two times a day (BID) | RESPIRATORY_TRACT | Status: DC
  Administered 2013-11-28 – 2013-11-30 (×4): 2 via RESPIRATORY_TRACT
  Filled 2013-11-28: qty 8.8

## 2013-11-28 MED ORDER — CLONIDINE HCL 0.1 MG/24HR TD PTWK
0.1000 mg | MEDICATED_PATCH | TRANSDERMAL | Status: DC
  Filled 2013-11-28: qty 1

## 2013-11-28 MED ORDER — AMIODARONE HCL 100 MG PO TABS
100.0000 mg | ORAL_TABLET | Freq: Every morning | ORAL | Status: DC
  Administered 2013-11-29 – 2013-11-30 (×2): 100 mg via ORAL
  Filled 2013-11-28 (×3): qty 1

## 2013-11-28 MED ORDER — SODIUM CHLORIDE 0.9 % IJ SOLN: 3.0000 mL | INTRAMUSCULAR | Status: DC | PRN

## 2013-11-28 MED ORDER — SILVER SULFADIAZINE 1 % EX CREA
1.0000 "application " | TOPICAL_CREAM | Freq: Every day | CUTANEOUS | Status: DC
  Administered 2013-11-29: 1 via TOPICAL
  Filled 2013-11-28: qty 85

## 2013-11-28 MED ORDER — ALBUTEROL SULFATE (2.5 MG/3ML) 0.083% IN NEBU
5.0000 mg | INHALATION_SOLUTION | Freq: Once | RESPIRATORY_TRACT | Status: AC
  Administered 2013-11-28: 5 mg via RESPIRATORY_TRACT
  Filled 2013-11-28: qty 6

## 2013-11-28 MED ORDER — IPRATROPIUM BROMIDE 0.02 % IN SOLN
0.5000 mg | Freq: Once | RESPIRATORY_TRACT | Status: AC
  Administered 2013-11-28: 0.5 mg via RESPIRATORY_TRACT
  Filled 2013-11-28: qty 2.5

## 2013-11-28 MED ORDER — FE FUM-VIT C-VIT B12-FA 460-60-0.01-1 MG PO CAPS: 1.0000 | ORAL_CAPSULE | Freq: Every day | ORAL | Status: DC

## 2013-11-28 MED ORDER — PIPERACILLIN-TAZOBACTAM 3.375 G IVPB
3.3750 g | Freq: Once | INTRAVENOUS | Status: AC
  Administered 2013-11-28: 3.375 g via INTRAVENOUS
  Filled 2013-11-28: qty 50

## 2013-11-28 MED ORDER — CHLORHEXIDINE GLUCONATE 0.12 % MT SOLN
15.0000 mL | Freq: Two times a day (BID) | OROMUCOSAL | Status: DC
  Administered 2013-11-28 – 2013-11-30 (×4): 15 mL via OROMUCOSAL
  Filled 2013-11-28 (×4): qty 15

## 2013-11-28 MED ORDER — CETYLPYRIDINIUM CHLORIDE 0.05 % MT LIQD
7.0000 mL | Freq: Two times a day (BID) | OROMUCOSAL | Status: DC
  Administered 2013-11-28 – 2013-11-30 (×4): 7 mL via OROMUCOSAL

## 2013-11-28 MED ORDER — CITALOPRAM HYDROBROMIDE 20 MG PO TABS
20.0000 mg | ORAL_TABLET | Freq: Every day | ORAL | Status: DC
  Administered 2013-11-29 – 2013-11-30 (×2): 20 mg via ORAL
  Filled 2013-11-28 (×2): qty 1

## 2013-11-28 MED ORDER — AMLODIPINE BESYLATE 10 MG PO TABS
10.0000 mg | ORAL_TABLET | Freq: Every day | ORAL | Status: DC
  Administered 2013-11-29 – 2013-11-30 (×2): 10 mg via ORAL
  Filled 2013-11-28 (×3): qty 1

## 2013-11-28 NOTE — ED Notes (Signed)
Patient's family would like for the wound care nurse to be consulted Will make floor nurse aware

## 2013-11-28 NOTE — ED Notes (Signed)
Medical Necessary filled in for wrong pt, disregard information if noted in chart.

## 2013-11-28 NOTE — ED Notes (Signed)
Patient brought in by family members Per family, "The doctor called me this morning to let us know that he has fluid around his heart" Family states that this was determined from "testing from the heart machine" Patient with c/o SOB Patient noted to have 2+ pedal and bilateral LE edema Blisters and open wounds noted to both legs

## 2013-11-28 NOTE — ED Notes (Signed)
Dr. Campos at bedside   

## 2013-11-28 NOTE — ED Notes (Signed)
Portable CXR at bedside.

## 2013-11-28 NOTE — Telephone Encounter (Signed)
X-ray results reviewed as well as pt's chart and recent phone notes. Given pneumonia with possible parapneumonic effusion as well as hypoxia. I recommend that this pt be treated at ER. Please have pt go to ER at once.  It is not safe to just treat at home with antibiotics.

## 2013-11-28 NOTE — Telephone Encounter (Signed)
Cassandra from home health called stating that the patient was found to have: Pneumonia in his left lower lung. Possibly with associated pneumoniae effusion. Borderline Cardiomegaly. Results are being faxed to [facility].
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
## 2013-11-28 NOTE — ED Notes (Signed)
Lung sounds improved s/p neb tx Lung sounds now clear, with exception to the left lower lobe (due to PNA) Patient reports that he is feeling better after neb tx

## 2013-11-28 NOTE — Progress Notes (Signed)
ANTIBIOTIC CONSULT NOTE - INITIAL  Pharmacy Consult for Vancomycin Indication: pneumonia  Allergies  Allergen Reactions  . Ace Inhibitors Other (See Comments)    me  hyposion  . Beta Adrenergic Blockers Other (See Comments)     use cautiously secondary to 2nd degree heart block, hypotension    Patient Measurements:    Body Weight: 108.9kg  Vital Signs: Temp: 97.6 F (36.4 C) (10/10 1216) Temp Source: Oral (10/10 1216) BP: 144/60 mmHg (10/10 1303) Pulse Rate: 76 (10/10 1303) Intake/Output from previous day:   Intake/Output from this shift:    Labs:  Recent Labs  11/28/13 1215  WBC 8.0  HGB 9.7*  PLT 332  CREATININE 2.24*   The CrCl is unknown because both a height and weight (above a minimum accepted value) are required for this calculation. No results found for this basename: VANCOTROUGH, VANCOPEAK, VANCORANDOM, GENTTROUGH, GENTPEAK, GENTRANDOM, TOBRATROUGH, TOBRAPEAK, TOBRARND, AMIKACINPEAK, AMIKACINTROU, AMIKACIN,  in the last 72 hours   Microbiology: No results found for this or any previous visit (from the past 720 hour(s)).  Medical History: Past Medical History  Diagnosis Date  . Chronic diastolic heart failure     secondary to diastolic dysfunction EF (previous EF 35-45%)ef 60%4/09  . Arrhythmia     atrial fibrillation /pt on amiodarone,thought to be poor coumadin  . Stroke   . Other and unspecified hyperlipidemia   . Hypertension   . Hypertrophy of prostate without urinary obstruction and other lower urinary tract symptoms (LUTS)   . Osteoarthrosis, unspecified whether generalized or localized, unspecified site   . Type II or unspecified type diabetes mellitus without mention of complication, not stated as uncontrolled   . AV block, Mobitz 1     Intolerance to ACE's / discontiuation of beta blockers  . Obstructive sleep apnea   . Iron deficiency anemia, unspecified     s/p EGD and coloscopy 3/10.gastritis.hemorrhids  . Cerebrovascular accident    . Renal insufficiency   . Obesity   . Allergic rhinitis, cause unspecified 05/29/2010  . CAD (coronary artery disease)     cath 12/04: mLAD 50-60%, mCFX 20-30%, pRCA 50-60%, mRCA 40%  . Normochromic normocytic anemia 04/21/2013  . Abnormal CXR 04/21/2013  . Hypertensive kidney disease with CKD stage III 04/21/2013  . Prostate cancer     Assessment: 80y.o. Male with increased shortness of breath over the past week. Patient with O2 saturations of 83% on arrival. Patient with productive cough. No fever at home. Family reports the patient has had some mild increase in his lower extremity swelling. Patient denies chest discomfort or chest pain. Reports productive cough and shortness of breath only. Symptoms are mild to moderate in severity. Suspect pneumonia superimposed on congestive heart failure.  Pharmacy consulted to dose vancomycin for pneumonia.   Goal of Therapy:  Vancomycin per renal function  Plan:   Vancomycin 2gm IV x 1 load then 1500mg  q48h  Follow renal function  Vanc trough as needed  Dolly Rias RPh 11/28/2013, 1:32 PM Pager [phone]

## 2013-11-28 NOTE — Telephone Encounter (Signed)
Spoke with both [facility] and Pt's daughter. Pt will be taken to the nearest ER by the daughter for evaluation and treatment. Results were placed on Robin's desk and note forwarded to Dr. Jenny Reichmann.
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
## 2013-11-29 DIAGNOSIS — R609 Edema, unspecified: Secondary | ICD-10-CM

## 2013-11-29 DIAGNOSIS — I83223 Varicose veins of left lower extremity with both ulcer of ankle and inflammation: Secondary | ICD-10-CM

## 2013-11-29 DIAGNOSIS — E1122 Type 2 diabetes mellitus with diabetic chronic kidney disease: Secondary | ICD-10-CM

## 2013-11-29 DIAGNOSIS — N189 Chronic kidney disease, unspecified: Secondary | ICD-10-CM

## 2013-11-29 LAB — BASIC METABOLIC PANEL
ANION GAP: 13 (ref 5–15)
BUN: 59 mg/dL — ABNORMAL HIGH (ref 6–23)
CALCIUM: 8.8 mg/dL (ref 8.4–10.5)
CO2: 26 mEq/L (ref 19–32)
Chloride: 105 mEq/L (ref 96–112)
Creatinine, Ser: 2.2 mg/dL — ABNORMAL HIGH (ref 0.50–1.35)
GFR calc Af Amer: 31 mL/min — ABNORMAL LOW (ref >90)
GFR, EST NON AFRICAN AMERICAN: 27 mL/min — AB (ref >90)
Glucose, Bld: 116 mg/dL — ABNORMAL HIGH (ref 70–99)
Potassium: 4.3 mEq/L (ref 3.7–5.3)
Sodium: 144 mEq/L (ref 137–147)

## 2013-11-29 LAB — CBC
HCT: 31.6 % — ABNORMAL LOW (ref 39.0–52.0)
Hemoglobin: 9.3 g/dL — ABNORMAL LOW (ref 13.0–17.0)
MCH: 24.2 pg — ABNORMAL LOW (ref 26.0–34.0)
MCHC: 29.4 g/dL — ABNORMAL LOW (ref 30.0–36.0)
MCV: 82.1 fL (ref 78.0–100.0)
PLATELETS: 323 10*3/uL (ref 150–400)
RBC: 3.85 MIL/uL — ABNORMAL LOW (ref 4.22–5.81)
RDW: 17.1 % — ABNORMAL HIGH (ref 11.5–15.5)
WBC: 6.3 10*3/uL (ref 4.0–10.5)

## 2013-11-29 LAB — GLUCOSE, CAPILLARY
GLUCOSE-CAPILLARY: 96 mg/dL (ref 70–99)
GLUCOSE-CAPILLARY: 156 mg/dL — AB (ref 70–99)
Glucose-Capillary: 137 mg/dL — ABNORMAL HIGH (ref 70–99)
Glucose-Capillary: 144 mg/dL — ABNORMAL HIGH (ref 70–99)
Glucose-Capillary: 193 mg/dL — ABNORMAL HIGH (ref 70–99)

## 2013-11-29 LAB — EXPECTORATED SPUTUM ASSESSMENT W GRAM STAIN, RFLX TO RESP C: Special Requests: NORMAL

## 2013-11-29 LAB — LEGIONELLA ANTIGEN, URINE

## 2013-11-29 NOTE — Progress Notes (Signed)
[facility] PROGRESS NOTE  [name] LPF:[id number] DOB: Oct 18, 1932 DOA: 11/28/2013 PCP: [name], MD  Assessment/Plan: 1. Healthcare associated pneumonia- patient started on cefepime and vancomycin, chest x-ray showed suspected pneumonia. He has been afebrile, WBC has been normal, blood pressure is stable. Blood culture results are pending, if no growth in next 24-48 hours, consider changing to IV Levaquin. 2. Acute on chronic diastolic CHF- likely the cause of the dyspnea. Patient was given Lasix 60 mg in the ED and started on Demodex 80 mg twice a day. Renal function has improved due to better renal perfusion after diuresis. We'll continue to monitor the renal functions. Patient's last echo from October 22 2013, showed grade 3 diastolic dysfunction. 3. Type 2 diabetes mellitus with renal complications- started on Levemir, sliding-scale insulin with NovoLog, blood glucose has been well controlled in the hospital. Last CBG 137, 96, 156. 4. C. KD stage III- patient's creatinine has improved after diuresis due to the better renal perfusion. We'll continue to monitor the renal function 5. Prostate cancer- patient has remote history of prostate cancer status post prostatectomy 15 years ago. Has questionable sclerotic lesions on the bone scan, followed by [name] at cancer center. 6. Paroxysmal atrial fibrillation- heart rate controlled continue amiodarone. 7. Stasis ulcer of left ankle/edema- continue diuresis, wound care consult, continue Silvadene cream to the ulcers. 8. Anemia- secondary to anemia of chronic disease, hemoglobin 9.3. Continue to monitor the hemoglobin hematocrit in the hospital. 9. DVT prophylaxis- Lovenox  Code Status: DO NOT RESUSCITATE Family Communication: *No family at bedside Disposition Plan: *Home when stable   Consultants:  None  Procedures:  None  Antibiotics:  Vancomycin  Cefepime  HPI/Subjective: 80 y.o. male with a PMH of chronic  respiratory failure on nocturnal oxygen, chronic diastolic CHF (normal systolic function, grade III diastolic dysfunction on Echo done 10/22/13), atrial fibrillation (poor coumadin candidate), type II DM, stage III CKD (baseline creatinine 1.9), multifactorial anemia (transfusion dependent), h/o metastatic prostate cancer s/p prostatectomy (stable bone mets on bone scan 11/03/13), recent hospitalization 10/21/13-10/26/13 for treatment of CAP and decompensated CHF who presented to the ER with worsening dyspnea. Upon initial evaluation in the ER, the BNP was found to be 1597 and CXR showed findings consistent with CHF and pneumonia. Hemoglobin was 9.7 and creatinine was 2.24. The patient tells me he has had progressive dyspnea over the past 2 weeks. He has a home health physical therapist and nurse, and they advised him to come to the hospital earlier this week because of low oxygen saturations. Patient also notices worsening lower extremity swelling, left-sided chest pain that is sharp and worsens with movement, and a productive cough. He denies any fever or chills. His appetite has been normal. He reports some weight gain from "fluid ".  Patient seen and examined, breathing better no fever blood pressure has been stable.  Objective: Filed Vitals:   11/29/13 0514  BP: 146/70  Pulse: 79  Temp: 98.6 F (37 C)  Resp:     Intake/Output Summary (Last 24 hours) at 11/29/13 1336 Last data filed at 11/29/13 0945  Gross per 24 hour  Intake   1200 ml  Output   3550 ml  Net  -2350 ml   Filed Weights   11/28/13 1441 11/29/13 0514  Weight: 108.3 kg (238 lb 12.1 oz) 106.5 kg (234 lb 12.6 oz)    Exam:  Physical Exam: Head: Normocephalic, atraumatic.  Neck: supple,No deformities, masses, or tenderness noted. Lungs: Normal respiratory effort. B/L Clear  to auscultation, no crackles or wheezes.  Heart: Regular RR. S1 and S2 normal  Abdomen: BS normoactive. Soft, Nondistended, non-tender.  Extremities: No  pretibial edema, no erythema   Data Reviewed: Basic Metabolic Panel:  Recent Labs Lab 11/28/13 1215 11/29/13 0430  NA 139 144  K 4.8 4.3  CL 103 105  CO2 22 26  GLUCOSE 125* 116*  BUN 62* 59*  CREATININE 2.24* 2.20*  CALCIUM 9.1 8.8   Liver Function Tests:  Recent Labs Lab 11/28/13 1215  AST 23  ALT 18  ALKPHOS 85  BILITOT 0.3  PROT 7.8  ALBUMIN 3.0*   No results found for this basename: LIPASE, AMYLASE,  in the last 168 hours No results found for this basename: AMMONIA,  in the last 168 hours CBC:  Recent Labs Lab 11/28/13 1215 11/29/13 0430  WBC 8.0 6.3  HGB 9.7* 9.3*  HCT 32.8* 31.6*  MCV 83.0 82.1  PLT 332 323   Cardiac Enzymes:  Recent Labs Lab 11/28/13 1215  TROPONINI <0.30   BNP (last 3 results)  Recent Labs  04/20/13 1116 10/21/13 2012 11/28/13 1215  PROBNP 1139.0* 1541.0* 1597.0*   CBG:  Recent Labs Lab 11/28/13 1722 11/28/13 2130 11/29/13 0736 11/29/13 1200  GLUCAP 149* 137* 96 156*    Recent Results (from the past 240 hour(s))  CULTURE, EXPECTORATED SPUTUM-ASSESSMENT     Status: None   Collection Time    11/29/13  5:16 AM      Result Value Ref Range Status   Specimen Description SPUTUM   Final   Special Requests Normal   Final   Sputum evaluation     Final   Value: THIS SPECIMEN IS ACCEPTABLE. RESPIRATORY CULTURE REPORT TO FOLLOW.   Report Status 11/29/2013 FINAL   Final     Studies: Dg Chest Port 1 View  11/28/2013   CLINICAL DATA:  Progressive shortness of breath over past 2 weeks. Intermittent left chest pain and cough  EXAM: PORTABLE CHEST - 1 VIEW  COMPARISON:  October 24, 2013  FINDINGS: There is left lower lobe consolidation with small left effusion. There is mild generalized interstitial edema. There is cardiomegaly with pulmonary venous hypertension.  IMPRESSION: Evidence of a degree of congestive heart failure. Superimposed consolidation left base. Suspect pneumonia superimposed on congestive heart failure.  Small left effusion, new.   Electronically Signed   By: Lowella Grip M.D.   On: 11/28/2013 12:45    Scheduled Meds: . allopurinol  100 mg Oral QHS  . amiodarone  100 mg Oral q morning - 10a  . amLODipine  10 mg Oral Daily  . antiseptic oral rinse  7 mL Mouth Rinse q12n4p  . aspirin EC  325 mg Oral Daily  . atorvastatin  40 mg Oral Daily  . ceFEPime (MAXIPIME) IV  1 g Intravenous Q12H  . chlorhexidine  15 mL Mouth Rinse BID  . citalopram  20 mg Oral Daily  . cloNIDine  0.1 mg Transdermal Weekly  . enoxaparin (LOVENOX) injection  40 mg Subcutaneous Q24H  . insulin aspart  0-15 Units Subcutaneous TID WC  . insulin aspart  0-5 Units Subcutaneous QHS  . insulin detemir  100 Units Subcutaneous Daily  . isosorbide mononitrate  60 mg Oral Daily  . mometasone-formoterol  2 puff Inhalation BID  . multivitamins with iron  1 tablet Oral Daily  . pantoprazole  40 mg Oral Daily  . silver sulfADIAZINE  1 application Topical Daily  . sodium chloride  3 mL Intravenous  Q12H  . torsemide  80 mg Oral BID  . [START ON 11/30/2013] vancomycin  1,500 mg Intravenous Q48H   Continuous Infusions:   Principal Problem:   HCAP (healthcare-associated pneumonia) Active Problems:   Type 2 diabetes mellitus with renal manifestations   Gout   Essential hypertension   Paroxysmal atrial fibrillation   CHRONIC KIDNEY DISEASE STAGE III (MODERATE)   Edema   Stasis ulcer of left ankle   Anemia of chronic disease   Prostate cancer   Mobility impaired   Acute on chronic diastolic CHF (congestive heart failure), NYHA class 1   Secondary malignant neoplasm of bone and bone marrow   Acute on chronic respiratory failure with hypoxia   On amiodarone therapy    Time spent: 25 minutes    [facility] Pager [phone]. If 7PM-7AM, please contact night-coverage at [email or web address], password [facility] 11/29/2013, 1:36 PM  LOS: 1 day

## 2013-11-29 NOTE — Progress Notes (Signed)
[facility]  Patient Status: Active (receiving services up to time of hospitalization)  [facility] is providing the following services: RN and PT  If patient discharges after hours, please call [phone].   [name] 11/29/2013, 12:41 PM

## 2013-11-29 NOTE — Evaluation (Signed)
Physical Therapy Evaluation Patient Details Name: [name] MRN: [id number] DOB: 1932/11/25 Today's Date: 11/29/2013   History of Present Illness  80 yo male adm with HCAP 11/28/13; PMHx: CHF, afib,  CAD, COPD, HTN, OSA, anemia, O2 dependent  Clinical Impression  Pt admitted with HCAP. Pt currently with functional limitations due to the deficits listed below (see PT Problem List). *Pt will benefit from skilled PT to increase their independence and safety with mobility to allow discharge with continued HHPT;  Pt not agreeable to standing or other mobility once initiated by PT, promises to try walking tomorrow.     Follow Up Recommendations Home health PT    Equipment Recommendations  None recommended by PT    Recommendations for Other Services       Precautions / Restrictions Precautions Precautions: Fall      Mobility  Bed Mobility               General bed mobility comments: pt in chair, attempted mobility, pt became agitated and refused, promising to walk and work with PT tomorrow  Transfers                    Ambulation/Gait                Stairs            Wheelchair Mobility    Modified Rankin (Stroke Patients Only)       Balance                                             Pertinent Vitals/Pain Pain Assessment: No/denies pain    Home Living Family/patient expects to be discharged to:: Private residence Living Arrangements: Alone   Type of Home: Apartment Home Access: [place]: Wheelchair - power;Walker - 4 wheels Additional Comments: uses wheelchair inside apt; walker in community    Prior Function           Comments: pt reports ambulating with RW and using w/c at home however states w/c more often now due to fatigue, weakness, and SOB; " I do everything in power chair"     Hand Dominance        Extremity/Trunk Assessment   Upper Extremity Assessment:  Overall WFL for tasks assessed           Lower Extremity Assessment: RLE deficits/detail;LLE deficits/detail RLE Deficits / Details: R LE stronger than L; AROM WFL; strength grossly 3+/5 LLE Deficits / Details: limited df; knee flexion to 75* then incr pain/stiffness knee and hip; hip and knee >2+/5     Communication   Communication: No difficulties  Cognition Arousal/Alertness: Awake/alert Behavior During Therapy: Agitated (pt becomes agitated with initiation of mobility) Overall Cognitive Status: Within Functional Limits for tasks assessed                      General Comments      Exercises Total Joint Exercises Ankle Circles/Pumps: AROM;Both;5 reps Heel Slides: AROM;AAROM;Both;5 reps Hip ABduction/ADduction: AROM;AAROM;Both;5 reps      Assessment/Plan    PT Assessment Patient needs continued PT services  PT Diagnosis Generalized weakness   PT Problem List Decreased strength;Decreased activity tolerance;Decreased mobility  PT Treatment Interventions DME instruction;Gait training;Functional mobility training;Therapeutic activities;Therapeutic exercise   PT Goals (Current goals can be  found in the Care Plan section) Acute Rehab PT Goals Patient Stated Goal: none stated Time For Goal Achievement: 12/06/13    Frequency Min 3X/week   Barriers to discharge        Co-evaluation               End of Session   Activity Tolerance: Other (comment) (self limited, not agreeable to mobility at time of eval) Patient left: in chair;with call bell/phone within reach           Time: 8242-3536 PT Time Calculation (min): 12 min   Charges:   PT Evaluation $Initial PT Evaluation Tier I: 1 Procedure     PT G CodesKenyon Kim 12-17-13, 3:10 PM

## 2013-11-29 NOTE — Progress Notes (Signed)
CARE MANAGEMENT NOTE 11/29/2013  Patient:  [name], [name]   Account Number:  [id number]  Date Initiated:  11/29/2013  Documentation initiated by:  [facility]  Subjective/Objective Assessment:   pneumonia     Action/Plan:   lives alone, Markus Jarvis # [phone] will assist with his care   Anticipated DC Date:     Anticipated DC Plan:  [place]  CM consult      [facility] Choice  Resumption Of Svcs/PTA Provider   Choice offered to / List presented to:  C-1 Patient        [place] arranged  HH-1 RN  [place].   Status of service:  In process, will continue to follow Medicare Important Message given?   (If response is "NO", the following Medicare IM given date fields will be blank) Date Medicare IM given:   Medicare IM given by:   Date Additional Medicare IM given:   Additional Medicare IM given by:    Discharge Disposition:    Per UR Regulation:    If discussed at Long Length of Stay Meetings, dates discussed:    Comments:  11/29/2013 1230 NCM spoke to pt and states he is active with [facility]. Has wheelchair at home. His dtr, [name] assist him at home. NCM contacted [facility] to make aware of admission. Will need resumption of care for [facility] if dc home. Jonnie Finner RN CCM Case Mgmt phone [phone]

## 2013-11-30 DIAGNOSIS — I10 Essential (primary) hypertension: Secondary | ICD-10-CM

## 2013-11-30 LAB — BASIC METABOLIC PANEL
Anion gap: 12 (ref 5–15)
BUN: 52 mg/dL — AB (ref 6–23)
CALCIUM: 9 mg/dL (ref 8.4–10.5)
CO2: 27 mEq/L (ref 19–32)
CREATININE: 2.31 mg/dL — AB (ref 0.50–1.35)
Chloride: 104 mEq/L (ref 96–112)
GFR calc Af Amer: 29 mL/min — ABNORMAL LOW (ref >90)
GFR calc non Af Amer: 25 mL/min — ABNORMAL LOW (ref >90)
GLUCOSE: 138 mg/dL — AB (ref 70–99)
Potassium: 4.1 mEq/L (ref 3.7–5.3)
Sodium: 143 mEq/L (ref 137–147)

## 2013-11-30 LAB — CBC
HCT: 33.6 % — ABNORMAL LOW (ref 39.0–52.0)
Hemoglobin: 9.9 g/dL — ABNORMAL LOW (ref 13.0–17.0)
MCH: 24 pg — ABNORMAL LOW (ref 26.0–34.0)
MCHC: 29.5 g/dL — ABNORMAL LOW (ref 30.0–36.0)
MCV: 81.6 fL (ref 78.0–100.0)
Platelets: 335 10*3/uL (ref 150–400)
RBC: 4.12 MIL/uL — ABNORMAL LOW (ref 4.22–5.81)
RDW: 16.8 % — AB (ref 11.5–15.5)
WBC: 6.8 10*3/uL (ref 4.0–10.5)

## 2013-11-30 LAB — GLUCOSE, CAPILLARY
GLUCOSE-CAPILLARY: 160 mg/dL — AB (ref 70–99)
Glucose-Capillary: 138 mg/dL — ABNORMAL HIGH (ref 70–99)

## 2013-11-30 MED ORDER — TORSEMIDE 20 MG PO TABS: 60.0000 mg | ORAL_TABLET | Freq: Two times a day (BID) | ORAL | Status: DC

## 2013-11-30 MED ORDER — ZOLPIDEM TARTRATE 5 MG PO TABS
5.0000 mg | ORAL_TABLET | Freq: Every evening | ORAL | Status: DC | PRN
  Administered 2013-11-30: 5 mg via ORAL
  Filled 2013-11-30: qty 1

## 2013-11-30 MED ORDER — AMOXICILLIN-POT CLAVULANATE 875-125 MG PO TABS: 1.0000 | ORAL_TABLET | Freq: Two times a day (BID) | ORAL | Status: DC

## 2013-11-30 MED ORDER — AMOXICILLIN-POT CLAVULANATE 875-125 MG PO TABS
1.0000 | ORAL_TABLET | Freq: Two times a day (BID) | ORAL | Status: DC
  Administered 2013-11-30: 1 via ORAL
  Filled 2013-11-30: qty 1

## 2013-11-30 NOTE — Care Management Note (Signed)
    Page 1 of 2   11/30/2013     1:26:43 PM CARE MANAGEMENT NOTE 11/30/2013  Patient:  [name], [name]   Account Number:  [id number]  Date Initiated:  11/29/2013  Documentation initiated by:  [facility]  Subjective/Objective Assessment:   pneumonia     Action/Plan:   lives alone, Markus Jarvis # [phone] will assist with his care   Anticipated DC Date:  11/30/2013   Anticipated DC Plan:  [place]  CM consult      [facility] Choice  Resumption Of Svcs/PTA Provider   Choice offered to / List presented to:  C-1 Patient        [place] arranged  HH-1 RN  [place].   Status of service:  Completed, signed off Medicare Important Message given?   (If response is "NO", the following Medicare IM given date fields will be blank) Date Medicare IM given:   Medicare IM given by:   Date Additional Medicare IM given:   Additional Medicare IM given by:    Discharge Disposition:  [place]  Per UR Regulation:  Reviewed for med. necessity/level of care/duration of stay  If discussed at [place] of Stay Meetings, dates discussed:    Comments:  11/30/13 [facility] RN,BSN NCM [phone] ACTIVE W/[facility]-HHRN/PT.TC KRISTEN AWARE OF D/C & HHC ORDERS.HOME 02 @ HOUSE IS FROM [facility] HE USES IT HS.HAS RW @ HOME.TC [place]IA REP JAMES TO INFORM HIM OF PATIENT [place] MEDICARE-HE DID RECEIVE THE REFERRAL FROM PCP-OTPT,BUT NO INSURANCE INFO-INCOMPLETE ORDER.PATIENT WILL GET THE BILL FOR 02,NOTIFIED [place] [facility] REP LECRETIA ALSO/DTR [name].NO FURTHER D/C NEEDS.  11/29/2013 1230 NCM spoke to pt and states he is active with [name]. Has wheelchair at home. His dtr, [name] assist him at home. NCM contacted [facility] to make aware of admission. Will need resumption of care for [facility] if dc home. Jonnie Finner RN CCM Case Mgmt phone [phone]

## 2013-11-30 NOTE — Consult Note (Signed)
WOC wound consult note Reason for Consult:Bilateral LE ulcerations (chronic) Wound type: Venous Pressure Ulcer POA: No Measurement: RLE:  3cm x 3cm x 0.2cm with 100% pink, moist wound bed.  LLE: 3cm x 3cm x 0.2cm with 100% pink, moist wound bed.  Right LE (proximal to ulcer):  Intact, fluid-filled blister (partial thickness tissue injury) Wound bed: As described above Drainage (amount, consistency, odor)  Periwound:Intact with hemosiderin staining and evidence of recent fluid loss (crevices) Dressing procedure/placement/frequency:I am in agreement with the POC initiated over the weekend:  Cleanse with NS, pat dry.  Cover with silver sulfadiazine cream in a 1/8 inch thick layer, top with soft silicone foam and change every other day. Patient is ready for discharge to the care of his competent caregiver with follow up from his [facility] and the PCP. [place] nursing team will not follow, but will remain available to this patient, the nursing and medical team.  Please re-consult if needed. Thanks, Maudie Flakes, MSN, RN, [place], [place], [place] [phone])

## 2013-11-30 NOTE — Progress Notes (Signed)
Daughter [name] at bedside to pick up pt for discharge.  All paperwork reviewed and signed, rxs given to [place] and home med instructions completed with teachback.  Pt discharged to home in stable condition accompanied by daughter [name] and family members.  [place] Breeze, RN 11/30/2013

## 2013-11-30 NOTE — Progress Notes (Addendum)
ANTIBIOTIC CONSULT NOTE - INITIAL  Pharmacy Consult for Augmentin Indication: pneumonia  Allergies  Allergen Reactions  . Ace Inhibitors Other (See Comments)    me  hyposion  . Beta Adrenergic Blockers Other (See Comments)     use cautiously secondary to 2nd degree heart block, hypotension    Patient Measurements: Height: 5' 10.5" (179.1 cm) Weight: 232 lb 5.8 oz (105.4 kg) IBW/kg (Calculated) : 74.15  Body Weight: 108.9kg  Vital Signs: Temp: 98.3 F (36.8 C) (10/12 0515) Temp Source: Oral (10/12 0515) BP: 159/66 mmHg (10/12 0515) Pulse Rate: 76 (10/12 0515) Intake/Output from previous day: 10/11 0701 - 10/12 0700 In: 1350 [P.O.:1200; IV Piggyback:150] Out: 4180 [Urine:4180] Intake/Output from this shift: Total I/O In: 480 [P.O.:480] Out: 600 [Urine:600]  Labs:  Recent Labs  11/28/13 1215 11/29/13 0430 11/30/13 0513  WBC 8.0 6.3 6.8  HGB 9.7* 9.3* 9.9*  PLT 332 323 335  CREATININE 2.24* 2.20* 2.31*   Estimated Creatinine Clearance: 31.8 ml/min (by C-G formula based on Cr of 2.31). No results found for this basename: VANCOTROUGH, Corlis Leak, VANCORANDOM, GENTTROUGH, GENTPEAK, GENTRANDOM, TOBRATROUGH, TOBRAPEAK, TOBRARND, AMIKACINPEAK, AMIKACINTROU, AMIKACIN,  in the last 72 hours   Microbiology: Recent Results (from the past 720 hour(s))  CULTURE, BLOOD (ROUTINE X 2)     Status: None   Collection Time    11/28/13  3:35 PM      Result Value Ref Range Status   Specimen Description BLOOD LEFT ARM   Final   Special Requests     Final   Value: BOTTLES DRAWN AEROBIC AND ANAEROBIC 10CC BOTH BOTTLES   Culture  Setup Time     Final   Value: 11/28/2013 23:41     Performed at [facility]   Culture     Final   Value:        BLOOD CULTURE RECEIVED NO GROWTH TO DATE CULTURE WILL BE HELD FOR 5 DAYS BEFORE ISSUING A FINAL NEGATIVE REPORT     Performed at [facility]   Report Status PENDING   Incomplete  CULTURE, BLOOD (ROUTINE X 2)     Status: None   Collection Time    11/28/13  3:41 PM      Result Value Ref Range Status   Specimen Description BLOOD LEFT ARM   Final   Special Requests     Final   Value: BOTTLES DRAWN AEROBIC AND ANAEROBIC 10CC BOTH BOTTLES   Culture  Setup Time     Final   Value: 11/28/2013 23:41     Performed at [facility]   Culture     Final   Value:        BLOOD CULTURE RECEIVED NO GROWTH TO DATE CULTURE WILL BE HELD FOR 5 DAYS BEFORE ISSUING A FINAL NEGATIVE REPORT     Performed at [facility]   Report Status PENDING   Incomplete  CULTURE, EXPECTORATED SPUTUM-ASSESSMENT     Status: None   Collection Time    11/29/13  5:16 AM      Result Value Ref Range Status   Specimen Description SPUTUM   Final   Special Requests Normal   Final   Sputum evaluation     Final   Value: THIS SPECIMEN IS ACCEPTABLE. RESPIRATORY CULTURE REPORT TO FOLLOW.   Report Status 11/29/2013 FINAL   Final  CULTURE, RESPIRATORY (NON-EXPECTORATED)     Status: None   Collection Time    11/29/13  5:16 AM  Result Value Ref Range Status   Specimen Description SPUTUM   Final   Special Requests NONE   Final   Gram Stain     Final   Value: RARE WBC PRESENT, PREDOMINANTLY PMN     RARE SQUAMOUS EPITHELIAL CELLS PRESENT     NO ORGANISMS SEEN     Performed at [facility]   Culture PENDING   Incomplete   Report Status PENDING   Incomplete    Medical History: Past Medical History  Diagnosis Date  . Chronic diastolic heart failure     secondary to diastolic dysfunction EF (previous EF 35-45%)ef 60%4/09  . Arrhythmia     atrial fibrillation /pt on amiodarone,thought to be poor coumadin  . Stroke   . Other and unspecified hyperlipidemia   . Hypertension   . Hypertrophy of prostate without urinary obstruction and other lower urinary tract symptoms (LUTS)   . Osteoarthrosis, unspecified whether generalized or localized, unspecified site   . Type II or unspecified type diabetes mellitus without mention of  complication, not stated as uncontrolled   . AV block, [place] 1     Intolerance to ACE's / discontiuation of beta blockers  . Obstructive sleep apnea   . Iron deficiency anemia, unspecified     s/p EGD and coloscopy 3/10.gastritis.hemorrhids  . Cerebrovascular accident   . Renal insufficiency   . Obesity   . Allergic rhinitis, cause unspecified 05/29/2010  . CAD (coronary artery disease)     cath 12/04: mLAD 50-60%, mCFX 20-30%, pRCA 50-60%, mRCA 40%  . Normochromic normocytic anemia 04/21/2013  . Abnormal CXR 04/21/2013  . Hypertensive kidney disease with CKD stage III 04/21/2013  . Prostate cancer     Assessment: 81y.o. Male with increased shortness of breath over the past week. Patient with O2 saturations of 83% on arrival. Patient with productive cough. No fever at home. Family reports the patient has had some mild increase in his lower extremity swelling. Patient denies chest discomfort or chest pain. Reports productive cough and shortness of breath only. Symptoms are mild to moderate in severity. Suspect pneumonia superimposed on congestive heart failure.  Pharmacy consulted to dose vancomycin for pneumonia and cefepime also started on 10/10. To change to PO abx's today and due to patient being on amiodarone will not prescribe Levaquin due to increased risk of QTc prolongation so will prescribe Augmentin instead after discussion with md  10/10 >> vanc >> 10/12 10/10 >> cefepime >> 10/12 10/12 >> Augmentin >>   Afebrile  WBC WNL, stable  SCr 2.31 (CKD with baseline SCr 2.3-2.6), CrCl 32 ml/min CG and 26 N   Goal of Therapy:  Treatment of PNA  Plan:  Augmentin 875mg  PO BID for CrCl > 30 ml/min  Adrian Saran, PharmD, BCPS Pager [phone] 11/30/2013 10:41 AM

## 2013-11-30 NOTE — Discharge Summary (Signed)
Physician Discharge Summary  [name] GBT:[id number] DOB: 03-Jan-1933 DOA: 11/28/2013  PCP: [name], MD  Admit date: 11/28/2013 Discharge date: 11/30/2013  Time spent: 50* minutes  Recommendations for Outpatient Follow-up:  1. *Follow up PCP in 2 weeks  Discharge Diagnoses:  Principal Problem:   HCAP (healthcare-associated pneumonia) Active Problems:   Type 2 diabetes mellitus with renal manifestations   Gout   Essential hypertension   Paroxysmal atrial fibrillation   CHRONIC KIDNEY DISEASE STAGE III (MODERATE)   Edema   Stasis ulcer of left ankle   Anemia of chronic disease   Prostate cancer   Mobility impaired   Acute on chronic diastolic CHF (congestive heart failure), NYHA class 1   Secondary malignant neoplasm of bone and bone marrow   Acute on chronic respiratory failure with hypoxia   On amiodarone therapy   Discharge Condition: *Stable  Diet recommendation: Low salt diet  Filed Weights   11/28/13 1441 11/29/13 0514 11/30/13 0458  Weight: 108.3 kg (238 lb 12.1 oz) 106.5 kg (234 lb 12.6 oz) 105.4 kg (232 lb 5.8 oz)    History of present illness:  80 y.o. male with a PMH of chronic respiratory failure on nocturnal oxygen, chronic diastolic CHF (normal systolic function, grade III diastolic dysfunction on Echo done 10/22/13), atrial fibrillation (poor coumadin candidate), type II DM, stage III CKD (baseline creatinine 1.9), multifactorial anemia (transfusion dependent), h/o metastatic prostate cancer s/p prostatectomy (stable bone mets on bone scan 11/03/13), recent hospitalization 10/21/13-10/26/13 for treatment of CAP and decompensated CHF who presented to the ER with worsening dyspnea. Upon initial evaluation in the ER, the BNP was found to be 1597 and CXR showed findings consistent with CHF and pneumonia. Hemoglobin was 9.7 and creatinine was 2.24. The patient tells me he has had progressive dyspnea over the past 2 weeks. He has a home health physical therapist and  nurse, and they advised him to come to the hospital earlier this week because of low oxygen saturations. Patient also notices worsening lower extremity swelling, left-sided chest pain that is sharp and worsens with movement, and a productive cough. He denies any fever or chills. His appetite has been normal. He reports some weight gain from "fluid ".   Hospital Course:   Healthcare associated pneumonia- patient started on cefepime and vancomycin, chest x-ray showed suspected pneumonia. He has been afebrile, WBC has been normal, blood pressure is stable. Blood culture results are pending, will switch the antibiotics to Augmentin 872/125 mg po BID x 7 more days.  Patient has home O2, and will be discharged with HHPT and RN   Acute on chronic diastolic CHF- likely the cause of the dyspnea. Patient was given Lasix 60 mg in the ED and started on Demodex 80 mg twice a day. Renal function has  Worsened due to diuresis, his breathing has improved, We'll change the Demadex to 60 mg po bid Patient's last echo from October 22 2013, showed grade 3 diastolic dysfunction.   Type 2 diabetes mellitus with renal complications- started on Levemir, sliding-scale insulin with NovoLog, blood glucose has been well controlled in the hospital. Last CBG 137, 96, 156. Will continue with the home regimen of Levemir 100 units subcut daily.  CKD stage III- patient's creatinine has improved after diuresis due to the better renal perfusion. We'll continue to monitor the renal function   Prostate cancer- patient has remote history of prostate cancer status post prostatectomy 15 years ago. Has questionable sclerotic lesions on the bone scan, followed  by [name] at cancer center.   Paroxysmal atrial fibrillation- heart rate controlled continue amiodarone.   Stasis ulcer of left ankle/edema- improved with diuresis, Sillvadene cream to the ulcers.   Anemia- secondary to anemia of chronic disease, hemoglobin  9.3.   Procedures:  None  Consultations:  None  Discharge Exam: Filed Vitals:   11/30/13 0515  BP: 159/66  Pulse: 76  Temp: 98.3 F (36.8 C)  Resp: 20    General: Appear in no acute distress Cardiovascular: S1s2 RRR Respiratory: *Clear bilaterally  Discharge Instructions You were cared for by a hospitalist during your hospital stay. If you have any questions about your discharge medications or the care you received while you were in the hospital after you are discharged, you can call the unit and asked to speak with the hospitalist on call if the hospitalist that took care of you is not available. Once you are discharged, your primary care physician will handle any further medical issues. Please note that NO REFILLS for any discharge medications will be authorized once you are discharged, as it is imperative that you return to your primary care physician (or establish a relationship with a primary care physician if you do not have one) for your aftercare needs so that they can reassess your need for medications and monitor your lab values.  Discharge Instructions   Diet - low sodium heart healthy    Complete by:  As directed      Increase activity slowly    Complete by:  As directed           Current Discharge Medication List    START taking these medications   Details  amoxicillin-clavulanate (AUGMENTIN) 875-125 MG per tablet Take 1 tablet by mouth every 12 (twelve) hours. Qty: 14 tablet, Refills: 0      CONTINUE these medications which have CHANGED   Details  torsemide (DEMADEX) 20 MG tablet Take 3 tablets (60 mg total) by mouth 2 (two) times daily. Takes three tablets (60mg )  Twice daily Qty: 90 tablet, Refills: 2      CONTINUE these medications which have NOT CHANGED   Details  allopurinol (ZYLOPRIM) 100 MG tablet Take 100 mg by mouth at bedtime.    amiodarone (PACERONE) 200 MG tablet Take 100 mg by mouth every morning.    amLODipine (NORVASC) 10 MG tablet  Take 10 mg by mouth daily.    aspirin EC 325 MG tablet Take 325 mg by mouth daily.    atorvastatin (LIPITOR) 40 MG tablet Take 40 mg by mouth daily.    citalopram (CELEXA) 10 MG tablet Take 20 mg by mouth daily.     cloNIDine (CATAPRES - DOSED IN MG/24 HR) 0.1 mg/24hr patch Place 1 patch (0.1 mg total) onto the skin once a week. Changes patch on Thursday's Qty: 4 patch, Refills: 3    darbepoetin (ARANESP) 300 MCG/0.6ML SOLN injection Inject 300 mcg into the skin every 14 (fourteen) days.    Fe Fum-Vit C-Vit B12-FA (TRIGELS-F) 460-60-0.01-1 MG CAPS capsule Take 1 capsule by mouth daily. Qty: 30 capsule, Refills: 6    Fluticasone-Salmeterol (ADVAIR DISKUS) 100-50 MCG/DOSE AEPB Inhale 1 puff into the lungs 2 (two) times daily. Qty: 1 each, Refills: 0    insulin detemir (LEVEMIR) 100 UNIT/ML injection Inject 100 Units into the skin every morning.    isosorbide mononitrate (IMDUR) 60 MG 24 hr tablet Take 1 tablet (60 mg total) by mouth daily. Qty: 90 tablet, Refills: 3    omeprazole (  PRILOSEC) 20 MG capsule Take 20 mg by mouth daily.    potassium chloride SA (K-DUR,KLOR-CON) 20 MEQ tablet Take 20 mEq by mouth 2 (two) times daily.    silver sulfADIAZINE (SILVADENE) 1 % cream Apply topically daily. Topical, Daily, For 21 days Apply to Bilateral LE ulcerations twice daily for three days (Wednesday through Friday) then decrease dressing changes to daily and continue for 21 days. Qty: 50 g, Refills: 0       Allergies  Allergen Reactions  . Ace Inhibitors Other (See Comments)    me  hyposion  . Beta Adrenergic Blockers Other (See Comments)     use cautiously secondary to 2nd degree heart block, hypotension   Follow-up Information   Follow up with [name], MD.   Specialties:  Internal Medicine, Radiology   Contact information:   [place] [id number] [phone]        The results of significant diagnostics from this hospitalization (including  imaging, microbiology, ancillary and laboratory) are listed below for reference.    Significant Diagnostic Studies: Nm Bone Scan Whole Body  11/03/2013   CLINICAL DATA:  Prostate cancer.  Osseous metastatic disease.  EXAM: NUCLEAR MEDICINE WHOLE BODY BONE SCAN  TECHNIQUE: Whole body anterior and posterior images were obtained approximately 3 hours after intravenous injection of radiopharmaceutical.  RADIOPHARMACEUTICALS:  26.4 mCi Technetium-99 MDP  COMPARISON:  Multiple exams, including 04/24/2013  FINDINGS: The patient has known blastic metastatic lesions in the spine and pelvis on CT examination, surprisingly poorly seen on bone scan. Faintly increased activity at L2, T9, and T10 are compatible with some of the sclerotic metastatic lesions in this vicinity but these are not as conspicuous as is often encountered on bone scan. The pelvic lesions are not readily visible at all.  Degenerative findings along the knees and shoulders. Degenerative findings at the sternoclavicular joints.  IMPRESSION: 1. The known metastatic lesions in the thoracic and lumbar spine are only faintly apparent. The pelvic lesions are not readily apparent at all. For this particular patient, bone scan seems relatively insensitive for the patient's known metastatic lesions.   Electronically Signed   By: Sherryl Barters M.D.   On: 11/03/2013 15:13   Dg Chest Port 1 View  11/28/2013   CLINICAL DATA:  Progressive shortness of breath over past 2 weeks. Intermittent left chest pain and cough  EXAM: PORTABLE CHEST - 1 VIEW  COMPARISON:  October 24, 2013  FINDINGS: There is left lower lobe consolidation with small left effusion. There is mild generalized interstitial edema. There is cardiomegaly with pulmonary venous hypertension.  IMPRESSION: Evidence of a degree of congestive heart failure. Superimposed consolidation left base. Suspect pneumonia superimposed on congestive heart failure. Small left effusion, new.   Electronically Signed    By: Lowella Grip M.D.   On: 11/28/2013 12:45    Microbiology: Recent Results (from the past 240 hour(s))  CULTURE, BLOOD (ROUTINE X 2)     Status: None   Collection Time    11/28/13  3:35 PM      Result Value Ref Range Status   Specimen Description BLOOD LEFT ARM   Final   Special Requests     Final   Value: BOTTLES DRAWN AEROBIC AND ANAEROBIC 10CC BOTH BOTTLES   Culture  Setup Time     Final   Value: 11/28/2013 23:41     Performed at [facility]   Culture     Final   Value:  BLOOD CULTURE RECEIVED NO GROWTH TO DATE CULTURE WILL BE HELD FOR 5 DAYS BEFORE ISSUING A FINAL NEGATIVE REPORT     Performed at [facility]   Report Status PENDING   Incomplete  CULTURE, BLOOD (ROUTINE X 2)     Status: None   Collection Time    11/28/13  3:41 PM      Result Value Ref Range Status   Specimen Description BLOOD LEFT ARM   Final   Special Requests     Final   Value: BOTTLES DRAWN AEROBIC AND ANAEROBIC 10CC BOTH BOTTLES   Culture  Setup Time     Final   Value: 11/28/2013 23:41     Performed at [facility]   Culture     Final   Value:        BLOOD CULTURE RECEIVED NO GROWTH TO DATE CULTURE WILL BE HELD FOR 5 DAYS BEFORE ISSUING A FINAL NEGATIVE REPORT     Performed at [facility]   Report Status PENDING   Incomplete  CULTURE, EXPECTORATED SPUTUM-ASSESSMENT     Status: None   Collection Time    11/29/13  5:16 AM      Result Value Ref Range Status   Specimen Description SPUTUM   Final   Special Requests Normal   Final   Sputum evaluation     Final   Value: THIS SPECIMEN IS ACCEPTABLE. RESPIRATORY CULTURE REPORT TO FOLLOW.   Report Status 11/29/2013 FINAL   Final  CULTURE, RESPIRATORY (NON-EXPECTORATED)     Status: None   Collection Time    11/29/13  5:16 AM      Result Value Ref Range Status   Specimen Description SPUTUM   Final   Special Requests NONE   Final   Gram Stain     Final   Value: RARE WBC PRESENT, PREDOMINANTLY PMN     RARE  SQUAMOUS EPITHELIAL CELLS PRESENT     NO ORGANISMS SEEN     Performed at [facility]   Culture     Final   Value: NORMAL OROPHARYNGEAL FLORA     Performed at [facility]   Report Status PENDING   Incomplete     Labs: Basic Metabolic Panel:  Recent Labs Lab 11/28/13 1215 11/29/13 0430 11/30/13 0513  NA 139 144 143  K 4.8 4.3 4.1  CL 103 105 104  CO2 22 26 27   GLUCOSE 125* 116* 138*  BUN 62* 59* 52*  CREATININE 2.24* 2.20* 2.31*  CALCIUM 9.1 8.8 9.0   Liver Function Tests:  Recent Labs Lab 11/28/13 1215  AST 23  ALT 18  ALKPHOS 85  BILITOT 0.3  PROT 7.8  ALBUMIN 3.0*   No results found for this basename: LIPASE, AMYLASE,  in the last 168 hours No results found for this basename: AMMONIA,  in the last 168 hours CBC:  Recent Labs Lab 11/28/13 1215 11/29/13 0430 11/30/13 0513  WBC 8.0 6.3 6.8  HGB 9.7* 9.3* 9.9*  HCT 32.8* 31.6* 33.6*  MCV 83.0 82.1 81.6  PLT 332 323 335   Cardiac Enzymes:  Recent Labs Lab 11/28/13 1215  TROPONINI <0.30   BNP: BNP (last 3 results)  Recent Labs  04/20/13 1116 10/21/13 2012 11/28/13 1215  PROBNP 1139.0* 1541.0* 1597.0*   CBG:  Recent Labs Lab 11/29/13 0736 11/29/13 1200 11/29/13 1628 11/29/13 2106 11/30/13 0737  GLUCAP 96 156* 144* 193* 138*       Signed:  Deaundra Dupriest S  [facility] 11/30/2013, 11:56 AM

## 2013-11-30 NOTE — Progress Notes (Deleted)
ANTIBIOTIC CONSULT NOTE - INITIAL  Pharmacy Consult for Levaquin Indication: pneumonia  Allergies  Allergen Reactions  . Ace Inhibitors Other (See Comments)    me  hyposion  . Beta Adrenergic Blockers Other (See Comments)     use cautiously secondary to 2nd degree heart block, hypotension    Patient Measurements: Height: 5' 10.5" (179.1 cm) Weight: 232 lb 5.8 oz (105.4 kg) IBW/kg (Calculated) : 74.15  Body Weight: 108.9kg  Vital Signs: Temp: 98.3 F (36.8 C) (10/12 0515) Temp Source: Oral (10/12 0515) BP: 159/66 mmHg (10/12 0515) Pulse Rate: 76 (10/12 0515) Intake/Output from previous day: 10/11 0701 - 10/12 0700 In: 1350 [P.O.:1200; IV Piggyback:150] Out: 4180 [Urine:4180] Intake/Output from this shift: Total I/O In: 480 [P.O.:480] Out: 600 [Urine:600]  Labs:  Recent Labs  11/28/13 1215 11/29/13 0430 11/30/13 0513  WBC 8.0 6.3 6.8  HGB 9.7* 9.3* 9.9*  PLT 332 323 335  CREATININE 2.24* 2.20* 2.31*   Estimated Creatinine Clearance: 31.8 ml/min (by C-G formula based on Cr of 2.31). No results found for this basename: VANCOTROUGH, Corlis Leak, VANCORANDOM, GENTTROUGH, GENTPEAK, GENTRANDOM, TOBRATROUGH, TOBRAPEAK, TOBRARND, AMIKACINPEAK, AMIKACINTROU, AMIKACIN,  in the last 72 hours   Microbiology: Recent Results (from the past 720 hour(s))  CULTURE, BLOOD (ROUTINE X 2)     Status: None   Collection Time    11/28/13  3:35 PM      Result Value Ref Range Status   Specimen Description BLOOD LEFT ARM   Final   Special Requests     Final   Value: BOTTLES DRAWN AEROBIC AND ANAEROBIC 10CC BOTH BOTTLES   Culture  Setup Time     Final   Value: 11/28/2013 23:41     Performed at [facility]   Culture     Final   Value:        BLOOD CULTURE RECEIVED NO GROWTH TO DATE CULTURE WILL BE HELD FOR 5 DAYS BEFORE ISSUING A FINAL NEGATIVE REPORT     Performed at [facility]   Report Status PENDING   Incomplete  CULTURE, BLOOD (ROUTINE X 2)     Status: None   Collection Time    11/28/13  3:41 PM      Result Value Ref Range Status   Specimen Description BLOOD LEFT ARM   Final   Special Requests     Final   Value: BOTTLES DRAWN AEROBIC AND ANAEROBIC 10CC BOTH BOTTLES   Culture  Setup Time     Final   Value: 11/28/2013 23:41     Performed at [facility]   Culture     Final   Value:        BLOOD CULTURE RECEIVED NO GROWTH TO DATE CULTURE WILL BE HELD FOR 5 DAYS BEFORE ISSUING A FINAL NEGATIVE REPORT     Performed at [facility]   Report Status PENDING   Incomplete  CULTURE, EXPECTORATED SPUTUM-ASSESSMENT     Status: None   Collection Time    11/29/13  5:16 AM      Result Value Ref Range Status   Specimen Description SPUTUM   Final   Special Requests Normal   Final   Sputum evaluation     Final   Value: THIS SPECIMEN IS ACCEPTABLE. RESPIRATORY CULTURE REPORT TO FOLLOW.   Report Status 11/29/2013 FINAL   Final  CULTURE, RESPIRATORY (NON-EXPECTORATED)     Status: None   Collection Time    11/29/13  5:16 AM  Result Value Ref Range Status   Specimen Description SPUTUM   Final   Special Requests NONE   Final   Gram Stain     Final   Value: RARE WBC PRESENT, PREDOMINANTLY PMN     RARE SQUAMOUS EPITHELIAL CELLS PRESENT     NO ORGANISMS SEEN     Performed at [facility]   Culture PENDING   Incomplete   Report Status PENDING   Incomplete    Medical History: Past Medical History  Diagnosis Date  . Chronic diastolic heart failure     secondary to diastolic dysfunction EF (previous EF 35-45%)ef 60%4/09  . Arrhythmia     atrial fibrillation /pt on amiodarone,thought to be poor coumadin  . Stroke   . Other and unspecified hyperlipidemia   . Hypertension   . Hypertrophy of prostate without urinary obstruction and other lower urinary tract symptoms (LUTS)   . Osteoarthrosis, unspecified whether generalized or localized, unspecified site   . Type II or unspecified type diabetes mellitus without mention of  complication, not stated as uncontrolled   . AV block, Mobitz 1     Intolerance to ACE's / discontiuation of beta blockers  . Obstructive sleep apnea   . Iron deficiency anemia, unspecified     s/p EGD and coloscopy 3/10.gastritis.hemorrhids  . Cerebrovascular accident   . Renal insufficiency   . Obesity   . Allergic rhinitis, cause unspecified 05/29/2010  . CAD (coronary artery disease)     cath 12/04: mLAD 50-60%, mCFX 20-30%, pRCA 50-60%, mRCA 40%  . Normochromic normocytic anemia 04/21/2013  . Abnormal CXR 04/21/2013  . Hypertensive kidney disease with CKD stage III 04/21/2013  . Prostate cancer     Assessment: 80y.o. Male with increased shortness of breath over the past week. Patient with O2 saturations of 83% on arrival. Patient with productive cough. No fever at home. Family reports the patient has had some mild increase in his lower extremity swelling. Patient denies chest discomfort or chest pain. Reports productive cough and shortness of breath only. Symptoms are mild to moderate in severity. Suspect pneumonia superimposed on congestive heart failure.  Pharmacy consulted to dose vancomycin for pneumonia and cefepime also started on 10/10  10/10 >> vanc >> 10/12 10/10 >> cefepime >> 10.12 10/12 >> levaquin >>   Afebrile  WBC WNL, stable  SCr 2.31 (CKD with baseline SCr 2.3-2.6), CrCl 32 ml/min CG and 26 N   Goal of Therapy:  Treatment of PNA  Plan:  For CrCl 20-49 ml/min, will start Levaquin 750mg  [place], PharmD, BCPS Pager [phone] 11/30/2013 10:27 AM

## 2013-12-01 ENCOUNTER — Telehealth: Payer: Self-pay | Admitting: *Deleted

## 2013-12-01 LAB — CULTURE, RESPIRATORY W GRAM STAIN: Culture: NORMAL

## 2013-12-01 LAB — CULTURE, RESPIRATORY

## 2013-12-01 NOTE — Telephone Encounter (Signed)
Tried calling pt concerning TCM no answer LMOM for daughter [name]) to return call back...[name]

## 2013-12-02 ENCOUNTER — Ambulatory Visit: Payer: Medicare PPO

## 2013-12-02 ENCOUNTER — Other Ambulatory Visit (HOSPITAL_BASED_OUTPATIENT_CLINIC_OR_DEPARTMENT_OTHER): Payer: Medicare PPO

## 2013-12-02 DIAGNOSIS — D469 Myelodysplastic syndrome, unspecified: Secondary | ICD-10-CM

## 2013-12-02 LAB — CBC WITH DIFFERENTIAL/PLATELET
BASO%: 0.2 % (ref 0.0–2.0)
BASOS ABS: 0 10*3/uL (ref 0.0–0.1)
EOS%: 7.9 % — ABNORMAL HIGH (ref 0.0–7.0)
Eosinophils Absolute: 0.6 10*3/uL — ABNORMAL HIGH (ref 0.0–0.5)
HEMATOCRIT: 31.3 % — AB (ref 38.4–49.9)
HEMOGLOBIN: 9.5 g/dL — AB (ref 13.0–17.1)
LYMPH#: 0.5 10*3/uL — AB (ref 0.9–3.3)
LYMPH%: 6.7 % — ABNORMAL LOW (ref 14.0–49.0)
MCH: 24.4 pg — ABNORMAL LOW (ref 27.2–33.4)
MCHC: 30.5 g/dL — AB (ref 32.0–36.0)
MCV: 80.1 fL (ref 79.3–98.0)
MONO#: 0.8 10*3/uL (ref 0.1–0.9)
MONO%: 10.2 % (ref 0.0–14.0)
NEUT%: 75 % (ref 39.0–75.0)
NEUTROS ABS: 5.9 10*3/uL (ref 1.5–6.5)
Platelets: 374 10*3/uL (ref 140–400)
RBC: 3.9 10*6/uL — ABNORMAL LOW (ref 4.20–5.82)
RDW: 18.4 % — ABNORMAL HIGH (ref 11.0–14.6)
WBC: 7.9 10*3/uL (ref 4.0–10.3)

## 2013-12-02 LAB — HOLD TUBE, BLOOD BANK

## 2013-12-02 NOTE — Telephone Encounter (Signed)
Called pt/daughter again still no answer LMOM to return call bck to set-up hospital f/u ASAP...[name]

## 2013-12-02 NOTE — Progress Notes (Signed)
Pt's   Hgb  9.5  Today.  Rubin Payor, desk nurse already spoke with pt.  Pt did not meet criteria for blood transfusion today.

## 2013-12-03 ENCOUNTER — Ambulatory Visit (HOSPITAL_COMMUNITY)
Admission: RE | Admit: 2013-12-03 | Discharge: 2013-12-03 | Disposition: A | Discharge status: Home / Self Care | Payer: Medicare PPO | Source: Ambulatory Visit | Attending: Internal Medicine | Admitting: Internal Medicine

## 2013-12-03 VITALS — BP 148/66 | HR 75 | Wt 242.0 lb

## 2013-12-03 DIAGNOSIS — N183 Chronic kidney disease, stage 3 unspecified: Secondary | ICD-10-CM

## 2013-12-03 DIAGNOSIS — I441 Atrioventricular block, second degree: Secondary | ICD-10-CM | POA: Diagnosis not present

## 2013-12-03 DIAGNOSIS — Z7982 Long term (current) use of aspirin: Secondary | ICD-10-CM | POA: Insufficient documentation

## 2013-12-03 DIAGNOSIS — Z9079 Acquired absence of other genital organ(s): Secondary | ICD-10-CM | POA: Diagnosis not present

## 2013-12-03 DIAGNOSIS — E785 Hyperlipidemia, unspecified: Secondary | ICD-10-CM | POA: Diagnosis not present

## 2013-12-03 DIAGNOSIS — I129 Hypertensive chronic kidney disease with stage 1 through stage 4 chronic kidney disease, or unspecified chronic kidney disease: Secondary | ICD-10-CM | POA: Insufficient documentation

## 2013-12-03 DIAGNOSIS — I5032 Chronic diastolic (congestive) heart failure: Secondary | ICD-10-CM | POA: Insufficient documentation

## 2013-12-03 DIAGNOSIS — I480 Paroxysmal atrial fibrillation: Secondary | ICD-10-CM | POA: Insufficient documentation

## 2013-12-03 DIAGNOSIS — Z8673 Personal history of transient ischemic attack (TIA), and cerebral infarction without residual deficits: Secondary | ICD-10-CM | POA: Diagnosis not present

## 2013-12-03 DIAGNOSIS — E039 Hypothyroidism, unspecified: Secondary | ICD-10-CM | POA: Insufficient documentation

## 2013-12-03 DIAGNOSIS — C61 Malignant neoplasm of prostate: Secondary | ICD-10-CM | POA: Insufficient documentation

## 2013-12-03 DIAGNOSIS — I2510 Atherosclerotic heart disease of native coronary artery without angina pectoris: Secondary | ICD-10-CM | POA: Diagnosis not present

## 2013-12-03 DIAGNOSIS — E119 Type 2 diabetes mellitus without complications: Secondary | ICD-10-CM | POA: Diagnosis not present

## 2013-12-03 MED ORDER — METOLAZONE 2.5 MG PO TABS: 2.5000 mg | ORAL_TABLET | ORAL | Status: DC | PRN

## 2013-12-03 NOTE — Progress Notes (Addendum)
Patient ID: [name], male   DOB: 04/22/1932, 80 y.o.   MRN: [id number]  Oncologist: Dr [name]   HPI: [name] is a very pleasant 80 year old male with a history of diastolic heart failure as well as 2nd degree heart block (Wenckebach), hypertension, hyperlipidemia, diabetes, previous CVA, and paroxysmal atrial fibrillation on amiodarone. Not on coumadin as felt to be high risk of bleeding due to h/o ETOH. Echo 3/11 EF 60-65% mild MR. Also has history of prostate cancer with prostatectomy 15 year ago and now with lesion on spine not thought to be from cancer.     Admitted to [facility] with volume overload 9/2 through 10/26/13 in the setting of recent transfusions. Diuresed with IV lasix. Discharge weigh was 238 pounds.   Admitted to [facility] 10/10 through 11/30/13 with increased dyspnea. Treated for HCAP and mild volume overload. Diuresed with IV lasix and given antibiotics. Discharge weight was 232 pounds.   He returns for post hospital follow up. Overall he complains of weakness but says he is slowly feeling better. Mild dyspnea with exertion. Sleeps in recliner. Taking all medications.Uses motorized wheelchair. Weight at home 238 pounds. Followed by [facility] for HHPT/HHRN/HHOT.  Daughter prepares pill box. Has Nurse Aide daily to assist with meals and ADLs.    Labs 10/26/13 Creatinine 2.07 K 3.5  Labs 11/30/13 K 4.1 Creatinine 2.31   ROS: All systems negative except as listed in HPI, PMH and Problem List.  Past Medical History  Diagnosis Date  . Chronic diastolic heart failure     secondary to diastolic dysfunction EF (previous EF 35-45%)ef 60%4/09  . Arrhythmia     atrial fibrillation /pt on amiodarone,thought to be poor coumadin  . Stroke   . Other and unspecified hyperlipidemia   . Hypertension   . Hypertrophy of prostate without urinary obstruction and other lower urinary tract symptoms (LUTS)   . Osteoarthrosis, unspecified whether generalized or localized, unspecified site   . Type II or  unspecified type diabetes mellitus without mention of complication, not stated as uncontrolled   . AV block, Mobitz 1     Intolerance to ACE's / discontiuation of beta blockers  . Obstructive sleep apnea   . Iron deficiency anemia, unspecified     s/p EGD and coloscopy 3/10.gastritis.hemorrhids  . Cerebrovascular accident   . Renal insufficiency   . Obesity   . Allergic rhinitis, cause unspecified 05/29/2010  . CAD (coronary artery disease)     cath 12/04: mLAD 50-60%, mCFX 20-30%, pRCA 50-60%, mRCA 40%  . Normochromic normocytic anemia 04/21/2013  . Abnormal CXR 04/21/2013  . Hypertensive kidney disease with CKD stage III 04/21/2013  . Prostate cancer     Current Outpatient Prescriptions  Medication Sig Dispense Refill  . allopurinol (ZYLOPRIM) 100 MG tablet Take 100 mg by mouth at bedtime.      Marland Kitchen amiodarone (PACERONE) 200 MG tablet Take 100 mg by mouth every morning.      Marland Kitchen amLODipine (NORVASC) 10 MG tablet Take 10 mg by mouth daily.      Marland Kitchen amoxicillin-clavulanate (AUGMENTIN) 875-125 MG per tablet Take 1 tablet by mouth every 12 (twelve) hours.  14 tablet  0  . aspirin EC 325 MG tablet Take 325 mg by mouth daily.      Marland Kitchen atorvastatin (LIPITOR) 40 MG tablet Take 40 mg by mouth daily.      . citalopram (CELEXA) 10 MG tablet Take 20 mg by mouth daily.       . cloNIDine (CATAPRES -  DOSED IN MG/24 HR) 0.1 mg/24hr patch Place 1 patch (0.1 mg total) onto the skin once a week. Changes patch on Thursday's  4 patch  3  . darbepoetin (ARANESP) 300 MCG/0.6ML SOLN injection Inject 300 mcg into the skin every 14 (fourteen) days.      . Fe Fum-Vit C-Vit B12-FA (TRIGELS-F) 460-60-0.01-1 MG CAPS capsule Take 1 capsule by mouth daily.  30 capsule  6  . Fluticasone-Salmeterol (ADVAIR DISKUS) 100-50 MCG/DOSE AEPB Inhale 1 puff into the lungs 2 (two) times daily.  1 each  0  . insulin detemir (LEVEMIR) 100 UNIT/ML injection Inject 100 Units into the skin every morning.      . isosorbide mononitrate (IMDUR) 60 MG  24 hr tablet Take 1 tablet (60 mg total) by mouth daily.  90 tablet  3  . omeprazole (PRILOSEC) 20 MG capsule Take 20 mg by mouth daily.      . potassium chloride SA (K-DUR,KLOR-CON) 20 MEQ tablet Take 20 mEq by mouth 2 (two) times daily.      . silver sulfADIAZINE (SILVADENE) 1 % cream Apply topically daily. Topical, Daily, For 21 days Apply to Bilateral LE ulcerations twice daily for three days (Wednesday through Friday) then decrease dressing changes to daily and continue for 21 days.  50 g  0  . torsemide (DEMADEX) 20 MG tablet Take 3 tablets (60 mg total) by mouth 2 (two) times daily. Takes three tablets (60mg )  Twice daily  90 tablet  2   No current facility-administered medications for this encounter.     PHYSICAL EXAM: Filed Vitals:   12/03/13 1604  BP: 161/62  Pulse: 75  Weight: 242 lb (109.77 kg)    General:  Chronically illl appearing. No resp difficulty. Arrived in wheelchair with his daughter.  HEENT: normal Neck: supple. JVP to jaw . Carotids 2+ bilaterally; no bruits. No lymphadenopathy or thryomegaly appreciated. Cor: PMI normal. Regular rate & rhythm. No rubs, gallops or murmurs. Lungs: LLL crackles.  Abdomen: obese, soft, nontender, nondistended. No hepatosplenomegaly. No bruits or masses. Good bowel sounds. Extremities: no cyanosis, clubbing, rash,  R and LLE 2+ edema RLE and LLE dressing in place.  Neuro: alert & orientedx3, cranial nerves grossly intact. Moves all 4 extremities w/o difficulty. Affect pleasant.      ASSESSMENT & PLAN: 1. Chronic Diastolic Heart Failure . NYHA III. 10/2013 ECHO normal EF Grade III DD Volume status trending up after discharge. Continue current torsemide and take metolazone 2.5 mg today and tomorrow with an extra 20 meq potassium.   Reinforced following low salt diet and limiting fluid intake to < 2 liters per day.    Check BM[facility] on Monday [facility].   Reinforced daily weights, low salt food choices, and limiting fluid intake to <2  liters per day.  2. PAF- regular rhythm. Continue amiodarone 100 mg daily not on anticoagulant due to noncomplaince 3. CKD - baseline 1.7-1.9 - Check BMET next Monday. [facility] aware.  4. Hypothyroidism - Per PCP Dr Jenny Reichmann  5. Prostate Cancer - S/P Prostatectomy 15 years ago with questionable lesion on spine  . Followed by Dr [name]    Follow up in 2 weeks to reassess volume status.  CLEGG,AMY NP-C  4:14 PM  Patient seen and examined with Darrick Grinder, NP. We discussed all aspects of the encounter. I agree with the assessment and plan as stated above.   Volume status is up even efter recent hospitalization. Agree with adding metolazone. F/u in 2 weeks. [facility] following.  D/w daughter at bedside.   Joselynn Amoroso,MD 11:55 PM

## 2013-12-03 NOTE — Patient Instructions (Signed)
Follow up in 2 weeks  Take metolazone 2.5 mg Friday  and Saturday with an extra 20 meq of potassium   Do the following things EVERYDAY: 1) Weigh yourself in the morning before breakfast. Write it down and keep it in a log. 2) Take your medicines as prescribed 3) Eat low salt foods-Limit salt (sodium) to 2000 mg per day.  4) Stay as active as you can everyday 5) Limit all fluids for the day to less than 2 liters

## 2013-12-04 LAB — CULTURE, BLOOD (ROUTINE X 2)
CULTURE: NO GROWTH
Culture: NO GROWTH

## 2013-12-07 ENCOUNTER — Other Ambulatory Visit: Payer: Self-pay

## 2013-12-07 MED ORDER — INSULIN DETEMIR 100 UNIT/ML ~~LOC~~ SOLN: 100.0000 [IU] | SUBCUTANEOUS | Status: DC

## 2013-12-08 DIAGNOSIS — Z0279 Encounter for issue of other medical certificate: Secondary | ICD-10-CM

## 2013-12-09 ENCOUNTER — Other Ambulatory Visit (HOSPITAL_COMMUNITY): Payer: Self-pay | Admitting: Internal Medicine

## 2013-12-10 ENCOUNTER — Ambulatory Visit: Payer: Medicare PPO | Admitting: Physical Therapy

## 2013-12-11 ENCOUNTER — Telehealth: Payer: Self-pay | Admitting: Internal Medicine

## 2013-12-11 ENCOUNTER — Telehealth (HOSPITAL_COMMUNITY): Payer: Self-pay

## 2013-12-11 MED ORDER — TORSEMIDE 20 MG PO TABS: ORAL_TABLET | ORAL | Status: DC

## 2013-12-11 NOTE — Telephone Encounter (Signed)
Calling in regards to order completed for wheelchair.  States one of the dates is incorrect.  She is requesting a call back in regards.

## 2013-12-11 NOTE — Telephone Encounter (Signed)
10/20 Labs BUN 89 Cr 2.93  Detailed left on patient's VM to decrease torsemide daily dose to 60mg  in am at 40mg  in pm per Junie Bame NP-C.  BMET added on to next appointment visit to recheck levels.  Renee Pain

## 2013-12-16 ENCOUNTER — Other Ambulatory Visit (HOSPITAL_BASED_OUTPATIENT_CLINIC_OR_DEPARTMENT_OTHER): Payer: Medicare PPO

## 2013-12-16 ENCOUNTER — Ambulatory Visit (HOSPITAL_BASED_OUTPATIENT_CLINIC_OR_DEPARTMENT_OTHER): Payer: Medicare PPO | Admitting: Oncology

## 2013-12-16 ENCOUNTER — Telehealth: Payer: Self-pay | Admitting: Oncology

## 2013-12-16 ENCOUNTER — Encounter: Payer: Self-pay | Admitting: Internal Medicine

## 2013-12-16 ENCOUNTER — Telehealth: Payer: Self-pay | Admitting: *Deleted

## 2013-12-16 VITALS — BP 134/63 | HR 81 | Temp 98.5°F | Resp 18 | Ht 70.5 in | Wt 234.3 lb

## 2013-12-16 DIAGNOSIS — N189 Chronic kidney disease, unspecified: Secondary | ICD-10-CM | POA: Diagnosis not present

## 2013-12-16 DIAGNOSIS — D631 Anemia in chronic kidney disease: Secondary | ICD-10-CM | POA: Diagnosis not present

## 2013-12-16 DIAGNOSIS — N183 Chronic kidney disease, stage 3 unspecified: Secondary | ICD-10-CM

## 2013-12-16 DIAGNOSIS — Z8546 Personal history of malignant neoplasm of prostate: Secondary | ICD-10-CM

## 2013-12-16 DIAGNOSIS — D509 Iron deficiency anemia, unspecified: Secondary | ICD-10-CM

## 2013-12-16 DIAGNOSIS — N039 Chronic nephritic syndrome with unspecified morphologic changes: Secondary | ICD-10-CM

## 2013-12-16 DIAGNOSIS — D469 Myelodysplastic syndrome, unspecified: Secondary | ICD-10-CM

## 2013-12-16 LAB — CBC WITH DIFFERENTIAL/PLATELET
BASO%: 0 % (ref 0.0–2.0)
Basophils Absolute: 0 10*3/uL (ref 0.0–0.1)
EOS%: 3.9 % (ref 0.0–7.0)
Eosinophils Absolute: 0.3 10*3/uL (ref 0.0–0.5)
HCT: 32.9 % — ABNORMAL LOW (ref 38.4–49.9)
HGB: 10.1 g/dL — ABNORMAL LOW (ref 13.0–17.1)
LYMPH%: 4.9 % — ABNORMAL LOW (ref 14.0–49.0)
MCH: 24.2 pg — ABNORMAL LOW (ref 27.2–33.4)
MCHC: 30.7 g/dL — ABNORMAL LOW (ref 32.0–36.0)
MCV: 78.9 fL — ABNORMAL LOW (ref 79.3–98.0)
MONO#: 0.9 10*3/uL (ref 0.1–0.9)
MONO%: 11.6 % (ref 0.0–14.0)
NEUT#: 6 10*3/uL (ref 1.5–6.5)
NEUT%: 79.6 % — ABNORMAL HIGH (ref 39.0–75.0)
Platelets: 345 10*3/uL (ref 140–400)
RBC: 4.17 10*6/uL — ABNORMAL LOW (ref 4.20–5.82)
RDW: 17.6 % — AB (ref 11.0–14.6)
WBC: 7.5 10*3/uL (ref 4.0–10.3)
lymph#: 0.4 10*3/uL — ABNORMAL LOW (ref 0.9–3.3)

## 2013-12-16 LAB — HOLD TUBE, BLOOD BANK

## 2013-12-16 MED ORDER — DARBEPOETIN ALFA-POLYSORBATE 300 MCG/0.6ML IJ SOLN
300.0000 ug | Freq: Once | INTRAMUSCULAR | Status: AC
  Administered 2013-12-16: 300 ug via SUBCUTANEOUS
  Filled 2013-12-16: qty 0.6

## 2013-12-16 NOTE — Telephone Encounter (Signed)
Gave avs & cal  for Nov/ Dec. Sent MW mess

## 2013-12-16 NOTE — Patient Instructions (Signed)
Darbepoetin Alfa injection What is this medicine? DARBEPOETIN ALFA (dar be POE e tin AL fa) helps your body make more red blood cells. It is used to treat anemia caused by chronic kidney failure and chemotherapy. This medicine may be used for other purposes; ask your health care provider or pharmacist if you have questions. COMMON BRAND NAME(S): Aranesp What should I tell my health care provider before I take this medicine? They need to know if you have any of these conditions: -blood clotting disorders or history of blood clots -cancer patient not on chemotherapy -cystic fibrosis -heart disease, such as angina, heart failure, or a history of a heart attack -hemoglobin level of 12 g/dL or greater -high blood pressure -low levels of folate, iron, or vitamin B12 -seizures -an unusual or allergic reaction to darbepoetin, erythropoietin, albumin, hamster proteins, latex, other medicines, foods, dyes, or preservatives -pregnant or trying to get pregnant -breast-feeding How should I use this medicine? This medicine is for injection into a vein or under the skin. It is usually given by a health care professional in a hospital or clinic setting. If you get this medicine at home, you will be taught how to prepare and give this medicine. Do not shake the solution before you withdraw a dose. Use exactly as directed. Take your medicine at regular intervals. Do not take your medicine more often than directed. It is important that you put your used needles and syringes in a special sharps container. Do not put them in a trash can. If you do not have a sharps container, call your pharmacist or healthcare provider to get one. Talk to your pediatrician regarding the use of this medicine in children. While this medicine may be used in children as young as 1 year for selected conditions, precautions do apply. Overdosage: If you think you have taken too much of this medicine contact a poison control center or  emergency room at once. NOTE: This medicine is only for you. Do not share this medicine with others. What if I miss a dose? If you miss a dose, take it as soon as you can. If it is almost time for your next dose, take only that dose. Do not take double or extra doses. What may interact with this medicine? Do not take this medicine with any of the following medications: -epoetin alfa This list may not describe all possible interactions. Give your health care provider a list of all the medicines, herbs, non-prescription drugs, or dietary supplements you use. Also tell them if you smoke, drink alcohol, or use illegal drugs. Some items may interact with your medicine. What should I watch for while using this medicine? Visit your prescriber or health care professional for regular checks on your progress and for the needed blood tests and blood pressure measurements. It is especially important for the doctor to make sure your hemoglobin level is in the desired range, to limit the risk of potential side effects and to give you the best benefit. Keep all appointments for any recommended tests. Check your blood pressure as directed. Ask your doctor what your blood pressure should be and when you should contact him or her. As your body makes more red blood cells, you may need to take iron, folic acid, or vitamin B supplements. Ask your doctor or health care provider which products are right for you. If you have kidney disease continue dietary restrictions, even though this medication can make you feel better. Talk with your doctor or health   care professional about the foods you eat and the vitamins that you take. What side effects may I notice from receiving this medicine? Side effects that you should report to your doctor or health care professional as soon as possible: -allergic reactions like skin rash, itching or hives, swelling of the face, lips, or tongue -breathing problems -changes in vision -chest  pain -confusion, trouble speaking or understanding -feeling faint or lightheaded, falls -high blood pressure -muscle aches or pains -pain, swelling, warmth in the leg -rapid weight gain -severe headaches -sudden numbness or weakness of the face, arm or leg -trouble walking, dizziness, loss of balance or coordination -seizures (convulsions) -swelling of the ankles, feet, hands -unusually weak or tired Side effects that usually do not require medical attention (report to your doctor or health care professional if they continue or are bothersome): -diarrhea -fever, chills (flu-like symptoms) -headaches -nausea, vomiting -redness, stinging, or swelling at site where injected This list may not describe all possible side effects. Call your doctor for medical advice about side effects. You may report side effects to FDA at 1-800-FDA-1088. Where should I keep my medicine? Keep out of the reach of children. Store in a refrigerator between 2 and 8 degrees C (36 and 46 degrees F). Do not freeze. Do not shake. Throw away any unused portion if using a single-dose vial. Throw away any unused medicine after the expiration date. NOTE: This sheet is a summary. It may not cover all possible information. If you have questions about this medicine, talk to your doctor, pharmacist, or health care provider.  2015, [employer]. (2008-01-20 10:23:57)  

## 2013-12-16 NOTE — Progress Notes (Signed)
Hematology and Oncology Follow Up Visit  [name] [id number] [birth year] 80 y.o. 12/16/2013 11:34 AM [name], MDJohn, Hunt Oris, MD   Principle Diagnosis: 80 year old gentleman with multifactorial anemia likely related to myelodysplastic syndrome. He also has an element of anemia of chronic disease as well as iron deficiency anemia. This was diagnosed in March of 2015. He also has questionable sclerotic bone lesions that were biopsied without any specific diagnosis made. He has a remote history of prostate cancer.  Current therapy: Supportive transfusions every 2 to 4 weeks as needed as well Aranesp 300 mcg every 2 to 4 weeks.  Interim History:  [name] presents today for a followup visit with his daughter. Since his last visit, he doing well without any complications. He has not had any setbacks from his congestive heart failure and did not require any hospitalizations. He is not reporting any chest pain or shortness of breath. He is able to ambulate with the help of a walker but for the most part his wheelchair bound. He still attends activities at the [facility] on a regular basis. He has not had any falls or illnesses. He has not reported any complications from Aranesp . He has not required transfusions for the last 2 months or so.Marland Kitchen He is not reporting any  difficulty breathing. He is not reporting any back pain shoulder pain or any bone pain at this time. He had not reported any hospitalizations or illnesses. Has not reported any headaches or blurry vision or double vision. Has not reported any nausea or vomiting or abdominal pain. Has not reported any frequency urgency or hesitancy. Rest or view of system is unremarkable.  Medications: I have reviewed the patient's current medications.  Current Outpatient Prescriptions  Medication Sig Dispense Refill  . allopurinol (ZYLOPRIM) 100 MG tablet Take 100 mg by mouth at bedtime.      Marland Kitchen amiodarone (PACERONE) 200 MG tablet Take 100 mg by  mouth every morning.      Marland Kitchen amLODipine (NORVASC) 10 MG tablet Take 10 mg by mouth daily.      Marland Kitchen amoxicillin-clavulanate (AUGMENTIN) 875-125 MG per tablet Take 1 tablet by mouth every 12 (twelve) hours.  14 tablet  0  . aspirin EC 325 MG tablet Take 325 mg by mouth daily.      Marland Kitchen atorvastatin (LIPITOR) 40 MG tablet Take 40 mg by mouth daily.      Marland Kitchen atorvastatin (LIPITOR) 40 MG tablet TAKE 1 TABLET BY MOUTH EVERY DAY  30 tablet  6  . citalopram (CELEXA) 10 MG tablet Take 20 mg by mouth daily.       . cloNIDine (CATAPRES - DOSED IN MG/24 HR) 0.1 mg/24hr patch Place 1 patch (0.1 mg total) onto the skin once a week. Changes patch on Thursday's  4 patch  3  . darbepoetin (ARANESP) 300 MCG/0.6ML SOLN injection Inject 300 mcg into the skin every 14 (fourteen) days.      . Fe Fum-Vit C-Vit B12-FA (TRIGELS-F) 460-60-0.01-1 MG CAPS capsule Take 1 capsule by mouth daily.  30 capsule  6  . Fluticasone-Salmeterol (ADVAIR) 100-50 MCG/DOSE AEPB Inhale 1 puff into the lungs as needed.      . insulin detemir (LEVEMIR) 100 UNIT/ML injection Inject 1 mL (100 Units total) into the skin every morning.  15 mL  5  . isosorbide mononitrate (IMDUR) 60 MG 24 hr tablet Take 1 tablet (60 mg total) by mouth daily.  90 tablet  3  . metolazone (ZAROXOLYN) 2.5  MG tablet Take 1 tablet (2.5 mg total) by mouth as needed.  8 tablet  6  . omeprazole (PRILOSEC) 20 MG capsule Take 20 mg by mouth daily.      . potassium chloride SA (K-DUR,KLOR-CON) 20 MEQ tablet Take 20 mEq by mouth 2 (two) times daily.      . silver sulfADIAZINE (SILVADENE) 1 % cream Apply topically daily. Topical, Daily, For 21 days Apply to Bilateral LE ulcerations twice daily for three days (Wednesday through Friday) then decrease dressing changes to daily and continue for 21 days.  50 g  0  . torsemide (DEMADEX) 20 MG tablet Take 3 tablets (90m) in am and 2 tablets (433m in pm  150 tablet  3   Current Facility-Administered Medications  Medication Dose Route  Frequency Provider Last Rate Last Dose  . darbepoetin (ARANESP) injection 300 mcg  300 mcg Subcutaneous Once FiWyatt PortelaMD         Allergies:  Allergies  Allergen Reactions  . Ace Inhibitors Other (See Comments)    me  hyposion  . Beta Adrenergic Blockers Other (See Comments)     use cautiously secondary to 2nd degree heart block, hypotension    Past Medical History, Surgical history, Social history, and Family History were reviewed and updated.   Physical Exam: Blood pressure 134/63, pulse 81, temperature 98.5 F (36.9 C), temperature source Oral, resp. rate 18, height 5' 10.5" (1.791 m), weight 234 lb 4.8 oz (106.278 kg). ECOG: 2 General appearance: alert and cooperative not in any distress. Head: Normocephalic, without obvious abnormality Neck: no adenopathy Lymph nodes: Cervical, supraclavicular, and axillary nodes normal. Heart:regular rate and rhythm, S1, S2 normal, no murmur, click, rub or gallop Lung:chest clear, no wheezing, rales, normal symmetric air entry Abdomin: soft, non-tender, without masses or organomegaly EXT: 1+ edema noted.   Lab Results: Lab Results  Component Value Date   WBC 7.5 12/16/2013   HGB 10.1* 12/16/2013   HCT 32.9* 12/16/2013   MCV 78.9* 12/16/2013   PLT 345 12/16/2013     Chemistry      Component Value Date/Time   NA 143 11/30/2013 0513   NA 142 10/21/2013 1034   K 4.1 11/30/2013 0513   K 4.1 10/21/2013 1034   CL 104 11/30/2013 0513   CO2 27 11/30/2013 0513   CO2 22 10/21/2013 1034   BUN 52* 11/30/2013 0513   BUN 45.8* 10/21/2013 1034   CREATININE 2.31* 11/30/2013 0513   CREATININE 2.3* 10/21/2013 1034   CREATININE 2.67* 04/22/2013 1801      Component Value Date/Time   CALCIUM 9.0 11/30/2013 0513   CALCIUM 8.7 10/21/2013 1034   ALKPHOS 85 11/28/2013 1215   ALKPHOS 73 10/21/2013 1034   AST 23 11/28/2013 1215   AST 16 10/21/2013 1034   ALT 18 11/28/2013 1215   ALT 17 10/21/2013 1034   BILITOT 0.3 11/28/2013 1215   BILITOT <0.20  10/21/2013 1034       Impression and Plan:  [age]ear old gentleman with the following issues:   1. Multifactorial anemia: He has an element of anemia of renal disease as well as iron deficiency anemia. He continues to be transfusion dependent despite Aransep, iron supplement and other interventions. His bone marrow biopsy completed on 09/09/2013 suggesting myelodysplastic syndrome. He is currently getting Aranesp every 2 weeks and have not needed any transfusions recently. I plan on increasing the frequency of his Aranesp to every 3 weeks and subsequently to every 4 weeks. We will continue to  transfuse as needed.   2. History of prostate cancer: He is status post prostatectomy close to 15 years ago.Now he has questionable sclerotic lesions with a PSA of around 1.89 and that was repeated and it was down to 1.09. It is unclear to me whether his bone lesions are indeed metastatic prostate cancer as his PSA continue to be very low and these lesions are not very obvious on the bone scan. We'll continue to monitor this for the time being.  3. Congestive heart failure: He appears to be compensated at this time.   4. Followup: He will follow-up in 3 weeks and subsequently every 4 weeks after that.      [facility], MD 10/28/201511:34 AM

## 2013-12-16 NOTE — Telephone Encounter (Signed)
Per staff message and POF I have scheduled appts. Advised scheduler of appts. JMW  

## 2013-12-18 ENCOUNTER — Other Ambulatory Visit (HOSPITAL_COMMUNITY): Payer: Self-pay | Admitting: Internal Medicine

## 2013-12-18 DIAGNOSIS — I5022 Chronic systolic (congestive) heart failure: Secondary | ICD-10-CM

## 2013-12-20 ENCOUNTER — Ambulatory Visit (HOSPITAL_COMMUNITY)
Admission: RE | Admit: 2013-12-20 | Discharge: 2013-12-20 | Disposition: A | Discharge status: Home / Self Care | Payer: Medicare PPO | Source: Ambulatory Visit | Attending: Oncology | Admitting: Oncology

## 2013-12-22 ENCOUNTER — Encounter (HOSPITAL_COMMUNITY): Payer: Self-pay

## 2013-12-22 ENCOUNTER — Telehealth: Payer: Self-pay | Admitting: Internal Medicine

## 2013-12-22 ENCOUNTER — Ambulatory Visit (HOSPITAL_COMMUNITY)
Admission: RE | Admit: 2013-12-22 | Discharge: 2013-12-22 | Disposition: A | Discharge status: Home / Self Care | Payer: Medicare PPO | Source: Ambulatory Visit | Attending: Internal Medicine | Admitting: Internal Medicine

## 2013-12-22 VITALS — BP 126/60 | HR 72 | Wt 235.8 lb

## 2013-12-22 DIAGNOSIS — I480 Paroxysmal atrial fibrillation: Secondary | ICD-10-CM | POA: Diagnosis not present

## 2013-12-22 DIAGNOSIS — I5032 Chronic diastolic (congestive) heart failure: Secondary | ICD-10-CM | POA: Diagnosis present

## 2013-12-22 DIAGNOSIS — N189 Chronic kidney disease, unspecified: Secondary | ICD-10-CM | POA: Diagnosis not present

## 2013-12-22 DIAGNOSIS — N183 Chronic kidney disease, stage 3 unspecified: Secondary | ICD-10-CM

## 2013-12-22 DIAGNOSIS — E039 Hypothyroidism, unspecified: Secondary | ICD-10-CM | POA: Insufficient documentation

## 2013-12-22 DIAGNOSIS — C61 Malignant neoplasm of prostate: Secondary | ICD-10-CM | POA: Diagnosis not present

## 2013-12-22 MED ORDER — METOLAZONE 2.5 MG PO TABS: 2.5000 mg | ORAL_TABLET | Freq: Every day | ORAL | Status: DC | PRN

## 2013-12-22 NOTE — Progress Notes (Signed)
Patient ID: [name], male   DOB: 07/08/1932, 80 y.o.   MRN: [id number]  Oncologist: Dr [name]   HPI: Mr. Seehafer is a very pleasant 80 year old male with a history of diastolic heart failure as well as 2nd degree heart block (Wenckebach), hypertension, hyperlipidemia, diabetes, previous CVA, and paroxysmal atrial fibrillation on amiodarone. Not on coumadin as felt to be high risk of bleeding due to h/o ETOH. Echo 3/11 EF 60-65% mild MR. Also has history of prostate cancer with prostatectomy 15 year ago and now with lesion on spine not thought to be from cancer.     Admitted to [facility] with volume overload 9/2 through 10/26/13 in the setting of recent transfusions. Diuresed with IV lasix. Discharge weigh was 238 pounds.   Admitted to [facility] 10/10 through 11/30/13 with increased dyspnea. Treated for HCAP and mild volume overload. Diuresed with IV lasix and given antibiotics. Discharge weight was 232 pounds.   Follow-up: We saw him for post-hospital f/u 2 weeks ago and weight was still up. He received 2 doses of metolazone. Weight down 7 pounds. Breathing is "great". Taking all medications.Uses motorized wheelchair. Has scale at home but can't see numbers. Wants talking scale. Followed by [facility] for HHPT/HHRN/HHOT.  Daughter prepares pill box. Has Nurse Aide daily to assist with meals and ADLs.    Labs 10/26/13 Creatinine 2.07 K 3.5  Labs 11/30/13 K 4.1 Creatinine 2.31   ROS: All systems negative except as listed in HPI, PMH and Problem List.  Past Medical History  Diagnosis Date  . Chronic diastolic heart failure     secondary to diastolic dysfunction EF (previous EF 35-45%)ef 60%4/09  . Arrhythmia     atrial fibrillation /pt on amiodarone,thought to be poor coumadin  . Stroke   . Other and unspecified hyperlipidemia   . Hypertension   . Hypertrophy of prostate without urinary obstruction and other lower urinary tract symptoms (LUTS)   . Osteoarthrosis, unspecified whether generalized or localized,  unspecified site   . Type II or unspecified type diabetes mellitus without mention of complication, not stated as uncontrolled   . AV block, Mobitz 1     Intolerance to ACE's / discontiuation of beta blockers  . Obstructive sleep apnea   . Iron deficiency anemia, unspecified     s/p EGD and coloscopy 3/10.gastritis.hemorrhids  . Cerebrovascular accident   . Renal insufficiency   . Obesity   . Allergic rhinitis, cause unspecified 05/29/2010  . CAD (coronary artery disease)     cath 12/04: mLAD 50-60%, mCFX 20-30%, pRCA 50-60%, mRCA 40%  . Normochromic normocytic anemia 04/21/2013  . Abnormal CXR 04/21/2013  . Hypertensive kidney disease with CKD stage III 04/21/2013  . Prostate cancer     Current Outpatient Prescriptions  Medication Sig Dispense Refill  . allopurinol (ZYLOPRIM) 100 MG tablet Take 100 mg by mouth at bedtime.    Marland Kitchen amiodarone (PACERONE) 200 MG tablet Take 100 mg by mouth every morning.    Marland Kitchen amLODipine (NORVASC) 10 MG tablet Take 10 mg by mouth daily.    Marland Kitchen amoxicillin-clavulanate (AUGMENTIN) 875-125 MG per tablet Take 1 tablet by mouth every 12 (twelve) hours. 14 tablet 0  . aspirin EC 325 MG tablet Take 325 mg by mouth daily.    Marland Kitchen atorvastatin (LIPITOR) 40 MG tablet TAKE 1 TABLET BY MOUTH EVERY DAY 30 tablet 6  . atorvastatin (LIPITOR) 40 MG tablet TAKE 1 TABLET BY MOUTH EVERY DAY 30 tablet 6  . citalopram (CELEXA) 10 MG tablet Take  20 mg by mouth daily.     . cloNIDine (CATAPRES - DOSED IN MG/24 HR) 0.1 mg/24hr patch Place 1 patch (0.1 mg total) onto the skin once a week. Changes patch on Thursday's 4 patch 3  . darbepoetin (ARANESP) 300 MCG/0.6ML SOLN injection Inject 300 mcg into the skin every 14 (fourteen) days.    . Fe Fum-Vit C-Vit B12-FA (TRIGELS-F) 460-60-0.01-1 MG CAPS capsule Take 1 capsule by mouth daily. 30 capsule 6  . Fluticasone-Salmeterol (ADVAIR) 100-50 MCG/DOSE AEPB Inhale 1 puff into the lungs as needed.    . insulin detemir (LEVEMIR) 100 UNIT/ML injection  Inject 1 mL (100 Units total) into the skin every morning. 15 mL 5  . isosorbide mononitrate (IMDUR) 60 MG 24 hr tablet Take 1 tablet (60 mg total) by mouth daily. 90 tablet 3  . omeprazole (PRILOSEC) 20 MG capsule Take 20 mg by mouth daily.    . potassium chloride SA (K-DUR,KLOR-CON) 20 MEQ tablet Take 20 mEq by mouth 2 (two) times daily.    . silver sulfADIAZINE (SILVADENE) 1 % cream Apply topically daily. Topical, Daily, For 21 days Apply to Bilateral LE ulcerations twice daily for three days (Wednesday through Friday) then decrease dressing changes to daily and continue for 21 days. 50 g 0  . torsemide (DEMADEX) 20 MG tablet Take 3 tablets (60mg ) in am and 2 tablets (40mg ) in pm 150 tablet 3   No current facility-administered medications for this encounter.     PHYSICAL EXAM: Filed Vitals:   12/22/13 1503  BP: 126/60  Pulse: 72  Weight: 235 lb 12.8 oz (106.958 kg)  SpO2: 88%    General:  Chronically illl appearing. No resp difficulty. Arrived in wheelchair with his daughter.  HEENT: normal Neck: supple. JVP 7 . Carotids 2+ bilaterally; no bruits. No lymphadenopathy or thryomegaly appreciated. Cor: PMI normal. Regular rate & rhythm. 2/6 TR Lungs: LLL crackles.  Abdomen: obese, soft, nontender, nondistended. No hepatosplenomegaly. No bruits or masses. Good bowel sounds. Extremities: no cyanosis, clubbing, rash,  R and LLE trace edema RLE and LLE dressing in place.  Neuro: alert & orientedx3, cranial nerves grossly intact. Moves all 4 extremities w/o difficulty. Affect pleasant.   ASSESSMENT & PLAN: 1. Chronic Diastolic Heart Failure . NYHA III. 10/2013 ECHO normal EF Grade III DD Volume status much improved. He is at baseline. Will continue current regimen. Will try to get him talking scale so he can follow weight more closely. Will use metolazone 2.5 mg daily PRN with 20 kcl for weight gain  Reinforced following low salt diet and limiting fluid intake to < 2 liters per day.     Check BMET with [facility].  Reinforced daily weights, low salt food choices, and limiting fluid intake to <2 liters per day.  2. PAF- regular rhythm. Continue amiodarone 100 mg daily not on anticoagulant due to noncomplaince 3. CKD - baseline 1.7-1.9 - Check BMET with [facility] 4. Hypothyroidism - Per PCP Dr Jenny Reichmann  5. Prostate Cancer - S/P Prostatectomy 15 years ago with questionable lesion on spine  . Followed by Dr Judd Gaudier Mickael Mcnutt,MD 3:17 PM

## 2013-12-22 NOTE — Telephone Encounter (Signed)
Home care nurse calling to inform that patients blood sugar has been running high 201 and 207 fasting before breakfast.  Patient is not compliant with diet.   Patient has a skin tear to left lower leg.  Is requesting order to clean and apply ointment and bandage.

## 2013-12-22 NOTE — Telephone Encounter (Signed)
[place] for order  I will have to consider further later today about the sugar

## 2013-12-22 NOTE — Patient Instructions (Signed)
For weight gain greater 3lbs or more overnight or 5 pounds or more in one week, take... 1 2.5mg  metolazone tablet 1 70meq tablet potassium  Call our office if this is a frequent occurrence.  Follow up in 6 weeks with Dr. Haroldine Laws.  Do the following things EVERYDAY: 1) Weigh yourself in the morning before breakfast. Write it down and keep it in a log. 2) Take your medicines as prescribed 3) Eat low salt foods-Limit salt (sodium) to 2000 mg per day.  4) Stay as active as you can everyday 5) Limit all fluids for the day to less than 2 liters

## 2013-12-23 NOTE — Telephone Encounter (Signed)
Pt is due for f/u for DM - ok to help pt make appt

## 2013-12-23 NOTE — Telephone Encounter (Signed)
HHRN informed of ok for order.  Please advise on sugar

## 2013-12-23 NOTE — Telephone Encounter (Signed)
i hesitate to change his medication as his May 2015 a1c was 5.0, very good at that time.    Please continue to monitor blood sugars at least twice per day for 5 days and report by phone. thanks

## 2013-12-23 NOTE — Telephone Encounter (Signed)
HHRN informed and did request an order to do an a1c

## 2013-12-24 NOTE — Telephone Encounter (Signed)
Spoke with daughter.  She stated she would call back to schedule.

## 2013-12-28 ENCOUNTER — Other Ambulatory Visit: Payer: Self-pay

## 2013-12-28 ENCOUNTER — Telehealth: Payer: Self-pay | Admitting: Internal Medicine

## 2013-12-28 MED ORDER — ALLOPURINOL 100 MG PO TABS: 100.0000 mg | ORAL_TABLET | Freq: Every day | ORAL | Status: DC

## 2013-12-28 NOTE — Telephone Encounter (Signed)
Pt is requesting refill of medicine for gout, and insulin pen. Pt has appt scheduled for 11/18 with Dr Jenny Reichmann.

## 2013-12-29 MED ORDER — INSULIN DETEMIR 100 UNIT/ML ~~LOC~~ SOLN: 100.0000 [IU] | SUBCUTANEOUS | Status: DC

## 2013-12-29 NOTE — Telephone Encounter (Signed)
Donita for [facility] calling in blood sugar readings for patient  Fri  mor  209 eve 179 Sat  mor 219 eve 367 Sun  mor [place] mor 247  Donita would also like to know if she could draw a A1C on patient , Please advise Phone 3 [phone]

## 2013-12-29 NOTE — Telephone Encounter (Signed)
Ok for a1c  250.02

## 2013-12-30 ENCOUNTER — Telehealth: Payer: Self-pay | Admitting: *Deleted

## 2013-12-30 MED ORDER — ALLOPURINOL 100 MG PO TABS: 100.0000 mg | ORAL_TABLET | Freq: Every day | ORAL | Status: DC

## 2013-12-30 NOTE — Telephone Encounter (Signed)
unfortuantely I cannot rx indocin (anti-inflammatory)due to his kidney dz  Needs OV

## 2013-12-30 NOTE — Telephone Encounter (Signed)
Called Donita at [phone] informed of ok for a1c.

## 2013-12-30 NOTE — Telephone Encounter (Signed)
Ok Done erx 

## 2013-12-30 NOTE — Telephone Encounter (Signed)
Need for labs to be determined at next OV

## 2013-12-30 NOTE — Telephone Encounter (Signed)
Called the daughter and she stated she wanted only Allopurinol as he has been taking for years and is almost out.  [facility].

## 2013-12-30 NOTE — Addendum Note (Signed)
Addended by: Biagio Borg on: 12/30/2013 07:37 PM   Modules accepted: Orders

## 2013-12-30 NOTE — Telephone Encounter (Signed)
Patient needs something called in for his gout pain.

## 2013-12-30 NOTE — Telephone Encounter (Signed)
Left msg on triage stating family has been trying to get pt allopurinol refilled. Daughter stated someone at the office told her md was waiting on her labs to come back before refilling. I have no orders for checking for gout just her Al1C. Vara Guardian md want additional labs...[name]

## 2013-12-31 NOTE — Telephone Encounter (Signed)
Travis Messing, RN no answer LMOM with md response...[name]

## 2013-12-31 NOTE — Telephone Encounter (Signed)
Patients daughter informed script sent in.

## 2014-01-01 ENCOUNTER — Other Ambulatory Visit: Payer: Self-pay | Admitting: Internal Medicine

## 2014-01-02 LAB — HGB A1C W/O EAG: Hgb A1c MFr Bld: 7.5 % — ABNORMAL HIGH (ref 4.8–5.6)

## 2014-01-05 ENCOUNTER — Encounter: Payer: Self-pay | Admitting: Internal Medicine

## 2014-01-06 ENCOUNTER — Encounter: Payer: Self-pay | Admitting: Internal Medicine

## 2014-01-06 ENCOUNTER — Other Ambulatory Visit (HOSPITAL_BASED_OUTPATIENT_CLINIC_OR_DEPARTMENT_OTHER): Payer: Medicare PPO

## 2014-01-06 ENCOUNTER — Other Ambulatory Visit: Payer: Self-pay | Admitting: *Deleted

## 2014-01-06 ENCOUNTER — Ambulatory Visit (INDEPENDENT_AMBULATORY_CARE_PROVIDER_SITE_OTHER): Payer: Medicare PPO | Admitting: Internal Medicine

## 2014-01-06 ENCOUNTER — Ambulatory Visit (HOSPITAL_BASED_OUTPATIENT_CLINIC_OR_DEPARTMENT_OTHER): Payer: Medicare PPO

## 2014-01-06 VITALS — BP 132/68 | HR 80 | Temp 98.7°F | Ht 70.0 in | Wt 236.0 lb

## 2014-01-06 DIAGNOSIS — N183 Chronic kidney disease, stage 3 unspecified: Secondary | ICD-10-CM

## 2014-01-06 DIAGNOSIS — D631 Anemia in chronic kidney disease: Secondary | ICD-10-CM | POA: Diagnosis not present

## 2014-01-06 DIAGNOSIS — N039 Chronic nephritic syndrome with unspecified morphologic changes: Secondary | ICD-10-CM

## 2014-01-06 DIAGNOSIS — E1121 Type 2 diabetes mellitus with diabetic nephropathy: Secondary | ICD-10-CM

## 2014-01-06 DIAGNOSIS — N189 Chronic kidney disease, unspecified: Secondary | ICD-10-CM

## 2014-01-06 DIAGNOSIS — I129 Hypertensive chronic kidney disease with stage 1 through stage 4 chronic kidney disease, or unspecified chronic kidney disease: Secondary | ICD-10-CM

## 2014-01-06 DIAGNOSIS — I5032 Chronic diastolic (congestive) heart failure: Secondary | ICD-10-CM

## 2014-01-06 LAB — HOLD TUBE, BLOOD BANK

## 2014-01-06 LAB — CBC WITH DIFFERENTIAL/PLATELET
BASO%: 0.9 % (ref 0.0–2.0)
Basophils Absolute: 0.1 10*3/uL (ref 0.0–0.1)
EOS%: 4.7 % (ref 0.0–7.0)
Eosinophils Absolute: 0.3 10*3/uL (ref 0.0–0.5)
HCT: 33.4 % — ABNORMAL LOW (ref 38.4–49.9)
HGB: 10.1 g/dL — ABNORMAL LOW (ref 13.0–17.1)
LYMPH%: 6.5 % — AB (ref 14.0–49.0)
MCH: 23.8 pg — ABNORMAL LOW (ref 27.2–33.4)
MCHC: 30.2 g/dL — AB (ref 32.0–36.0)
MCV: 78.5 fL — AB (ref 79.3–98.0)
MONO#: 0.8 10*3/uL (ref 0.1–0.9)
MONO%: 12 % (ref 0.0–14.0)
NEUT#: 4.9 10*3/uL (ref 1.5–6.5)
NEUT%: 75.9 % — ABNORMAL HIGH (ref 39.0–75.0)
Platelets: 300 10*3/uL (ref 140–400)
RBC: 4.25 10*6/uL (ref 4.20–5.82)
RDW: 20.4 % — ABNORMAL HIGH (ref 11.0–14.6)
WBC: 6.4 10*3/uL (ref 4.0–10.3)
lymph#: 0.4 10*3/uL — ABNORMAL LOW (ref 0.9–3.3)

## 2014-01-06 MED ORDER — ONETOUCH ULTRA 2 W/DEVICE KIT: PACK | Status: DC

## 2014-01-06 MED ORDER — DARBEPOETIN ALFA 300 MCG/0.6ML IJ SOSY
300.0000 ug | PREFILLED_SYRINGE | Freq: Once | INTRAMUSCULAR | Status: AC
  Administered 2014-01-06: 300 ug via SUBCUTANEOUS
  Filled 2014-01-06: qty 0.6

## 2014-01-06 MED ORDER — INSULIN DETEMIR 100 UNIT/ML FLEXPEN: PEN_INJECTOR | SUBCUTANEOUS | Status: DC

## 2014-01-06 NOTE — Assessment & Plan Note (Signed)
Mild uncontrolled, o/w stable overall by history and exam, recent data reviewed with pt, and pt ok to increase the levemir to 55 units bid,  to f/u any worsening symptoms or concern  Lab Results  Component Value Date   HGBA1C 7.5* 01/01/2014

## 2014-01-06 NOTE — Assessment & Plan Note (Signed)
stable overall by history and exam, , and pt to continue medical treatment as before,  to f/u any worsening symptoms or concerns  

## 2014-01-06 NOTE — Progress Notes (Signed)
Pre visit review using our clinic review tool, if applicable. No additional management support is needed unless otherwise documented below in the visit note. 

## 2014-01-06 NOTE — Progress Notes (Signed)
Hgb: 10.1. Patient is not symptomatic and will not receive blood today. Patient received aranesp injection.

## 2014-01-06 NOTE — Progress Notes (Signed)
Subjective:    Patient ID: [name], male    DOB: 25-Aug-1932, 80 y.o.   MRN: [id number]  HPI  Here to f/u; overall doing ok,  Pt denies chest pain, increased sob or doe, wheezing, orthopnea, PND, increased LE swelling, palpitations, dizziness or syncope.  Pt denies polydipsia, polyuria, or low sugar symptoms such as weakness or confusion improved with po intake.  Pt denies new neurological symptoms such as new headache, or facial or extremity weakness or numbness.   Pt states overall good compliance with meds, has been trying to follow lower cholesterol, diabetic diet, with wt overall stable. Sees wound clinic for LLE wound regularly, near healed Wt Readings from Last 3 Encounters:  01/06/14 236 lb (107.049 kg)  12/22/13 235 lb 12.8 oz (106.958 kg)  12/16/13 234 lb 4.8 oz (106.278 kg)   Past Medical History  Diagnosis Date  . Chronic diastolic heart failure     secondary to diastolic dysfunction EF (previous EF 35-45%)ef 60%4/09  . Arrhythmia     atrial fibrillation /pt on amiodarone,thought to be poor coumadin  . Stroke   . Other and unspecified hyperlipidemia   . Hypertension   . Hypertrophy of prostate without urinary obstruction and other lower urinary tract symptoms (LUTS)   . Osteoarthrosis, unspecified whether generalized or localized, unspecified site   . Type II or unspecified type diabetes mellitus without mention of complication, not stated as uncontrolled   . AV block, Mobitz 1     Intolerance to ACE's / discontiuation of beta blockers  . Obstructive sleep apnea   . Iron deficiency anemia, unspecified     s/p EGD and coloscopy 3/10.gastritis.hemorrhids  . Cerebrovascular accident   . Renal insufficiency   . Obesity   . Allergic rhinitis, cause unspecified 05/29/2010  . CAD (coronary artery disease)     cath 12/04: mLAD 50-60%, mCFX 20-30%, pRCA 50-60%, mRCA 40%  . Normochromic normocytic anemia 04/21/2013  . Abnormal CXR 04/21/2013  . Hypertensive kidney disease with  CKD stage III 04/21/2013  . Prostate cancer    Past Surgical History  Procedure Laterality Date  . Prostate surgery    . Prostate surgury      hx of  . Esophagogastroduodenoscopy N/A 04/21/2013    Procedure: ESOPHAGOGASTRODUODENOSCOPY (EGD);  Surgeon: Irene Shipper, MD;  Location: [facility];  Service: Endoscopy;  Laterality: N/A;    reports that he has quit smoking. He has never used smokeless tobacco. He reports that he does not drink alcohol or use illicit drugs. family history includes CAD in his sister; Dementia in his mother; Heart disease in his father. Allergies  Allergen Reactions  . Ace Inhibitors Other (See Comments)    me  hyposion  . Beta Adrenergic Blockers Other (See Comments)     use cautiously secondary to 2nd degree heart block, hypotension   Current Outpatient Prescriptions on File Prior to Visit  Medication Sig Dispense Refill  . allopurinol (ZYLOPRIM) 100 MG tablet Take 1 tablet (100 mg total) by mouth at bedtime. 30 tablet 11  . amiodarone (PACERONE) 200 MG tablet Take 100 mg by mouth every morning.    Marland Kitchen amLODipine (NORVASC) 10 MG tablet Take 10 mg by mouth daily.    Marland Kitchen aspirin EC 325 MG tablet Take 325 mg by mouth daily.    Marland Kitchen atorvastatin (LIPITOR) 40 MG tablet TAKE 1 TABLET BY MOUTH EVERY DAY 30 tablet 6  . citalopram (CELEXA) 10 MG tablet Take 20 mg by mouth daily.     Marland Kitchen  cloNIDine (CATAPRES - DOSED IN MG/24 HR) 0.1 mg/24hr patch Place 1 patch (0.1 mg total) onto the skin once a week. Changes patch on Thursday's 4 patch 3  . darbepoetin (ARANESP) 300 MCG/0.6ML SOLN injection Inject 300 mcg into the skin every 14 (fourteen) days.    . Fe Fum-Vit C-Vit B12-FA (TRIGELS-F) 460-60-0.01-1 MG CAPS capsule Take 1 capsule by mouth daily. 30 capsule 6  . Fluticasone-Salmeterol (ADVAIR) 100-50 MCG/DOSE AEPB Inhale 1 puff into the lungs as needed.    . isosorbide mononitrate (IMDUR) 60 MG 24 hr tablet Take 1 tablet (60 mg total) by mouth daily. 90 tablet 3  . metolazone  (ZAROXOLYN) 2.5 MG tablet Take 1 tablet (2.5 mg total) by mouth daily as needed (for weight gain, take 1 potassium tablet with this as well). 10 tablet 3  . omeprazole (PRILOSEC) 20 MG capsule Take 20 mg by mouth daily.    . potassium chloride SA (K-DUR,KLOR-CON) 20 MEQ tablet Take 20 mEq by mouth daily as needed. Along with metolazone as needed once daily for weight gain    . silver sulfADIAZINE (SILVADENE) 1 % cream Apply topically daily. Topical, Daily, For 21 days Apply to Bilateral LE ulcerations twice daily for three days (Wednesday through Friday) then decrease dressing changes to daily and continue for 21 days. 50 g 0  . torsemide (DEMADEX) 20 MG tablet Take 3 tablets (60mg ) in am and 2 tablets (40mg ) in pm 150 tablet 3   No current facility-administered medications on file prior to visit.   Review of Systems  Constitutional: Negative for unusual diaphoresis or other sweats  HENT: Negative for ringing in ear Eyes: Negative for double vision or worsening visual disturbance.  Respiratory: Negative for choking and stridor.   Gastrointestinal: Negative for vomiting or other signifcant bowel change Genitourinary: Negative for hematuria or decreased urine volume.  Musculoskeletal: Negative for other MSK pain or swelling Skin: Negative for color change and worsening wound.  Neurological: Negative for tremors and numbness other than noted  Psychiatric/Behavioral: Negative for decreased concentration or agitation other than above       Objective:   Physical Exam BP 132/68 mmHg  Pulse 80  Temp(Src) 98.7 F (37.1 C) (Oral)  Ht 5\' 10"  (1.778 m)  Wt 236 lb (107.049 kg)  BMI 33.86 kg/m2  SpO2 91% VS noted,  Constitutional: Pt appears well-developed, well-nourished.  HENT: Head: NCAT.  Right Ear: External ear normal.  Left Ear: External ear normal.  Eyes: . Pupils are equal, round, and reactive to light. Conjunctivae and EOM are normal Neck: Normal range of motion. Neck supple.    Cardiovascular: Normal rate and regular rhythm.   Pulmonary/Chest: Effort normal and breath sounds normal.  Abd:  Soft, NT, ND, + BS Neurological: Pt is alert. Not confused , motor grossly intact Skin: Skin is warm. No rash LLE mild edema to knee, no fluctuance or drainage Psychiatric: Pt behavior is normal. No agitation.     Assessment & Plan:

## 2014-01-06 NOTE — Assessment & Plan Note (Signed)
stable overall by history and exam, recent data reviewed with pt, and pt to continue medical treatment as before,  to f/u any worsening symptoms or concerns BP Readings from Last 3 Encounters:  01/06/14 132/68  12/22/13 126/60  12/16/13 134/63   Lab Results  Component Value Date   CREATININE 2.31* 11/30/2013

## 2014-01-06 NOTE — Patient Instructions (Signed)
Ok to increase the levemir to 55 units twice per day  Please continue all other medications as before, and refills have been done if requested.  Please have the pharmacy call with any other refills you may need.  Please continue your efforts at being more active, low cholesterol diet, and weight control.  Please keep your appointments with your specialists as you may have planned  Please return in 4 months, or sooner if needed

## 2014-01-08 ENCOUNTER — Ambulatory Visit: Payer: Medicare PPO | Admitting: Internal Medicine

## 2014-01-08 ENCOUNTER — Telehealth: Payer: Self-pay | Admitting: Internal Medicine

## 2014-01-08 NOTE — Telephone Encounter (Signed)
Patient has a new wound on his right lower leg and HHRN needs verbal ok to apply silvadene.call back number [phone] ok to leave a detailed message.

## 2014-01-08 NOTE — Telephone Encounter (Signed)
Ok for verbal 

## 2014-01-08 NOTE — Telephone Encounter (Signed)
[facility] has questions and needs order to apply med to wound. Pls call nurse at [facility]. Pleas call this nurse back  [phone]

## 2014-01-11 DIAGNOSIS — E119 Type 2 diabetes mellitus without complications: Secondary | ICD-10-CM | POA: Diagnosis not present

## 2014-01-11 DIAGNOSIS — C61 Malignant neoplasm of prostate: Secondary | ICD-10-CM | POA: Diagnosis not present

## 2014-01-11 DIAGNOSIS — I5033 Acute on chronic diastolic (congestive) heart failure: Secondary | ICD-10-CM | POA: Diagnosis not present

## 2014-01-11 DIAGNOSIS — C7951 Secondary malignant neoplasm of bone: Secondary | ICD-10-CM | POA: Diagnosis not present

## 2014-01-19 ENCOUNTER — Ambulatory Visit (HOSPITAL_COMMUNITY)
Admission: RE | Admit: 2014-01-19 | Discharge: 2014-01-19 | Disposition: A | Discharge status: Home / Self Care | Payer: Medicare PPO | Source: Ambulatory Visit | Attending: Oncology | Admitting: Oncology

## 2014-01-22 ENCOUNTER — Telehealth: Payer: Self-pay | Admitting: Internal Medicine

## 2014-01-22 NOTE — Telephone Encounter (Signed)
Verbal okay give to [place] with [facility] for Mobile Chest X-ray and Speech Therapy per Dr. Jenny Reichmann.

## 2014-01-22 NOTE — Telephone Encounter (Signed)
[facility] called and stated that pt was coughing while eating/drinking during lunch today. Oxy saturation was 82%, started O2 went up to 97%,the nurse heard rhonchi in lung bases -- nurse requesting mobile chest xray and order for speech therapy to evaluate. [phone]

## 2014-01-22 NOTE — Telephone Encounter (Signed)
Ok for both

## 2014-01-25 ENCOUNTER — Telehealth: Payer: Self-pay | Admitting: Internal Medicine

## 2014-01-25 DIAGNOSIS — R1310 Dysphagia, unspecified: Secondary | ICD-10-CM

## 2014-01-25 NOTE — Telephone Encounter (Signed)
Called [name]  Gave her md response. [name] stated that she received order on Friday results for cxr has been sent already...[name]

## 2014-01-25 NOTE — Telephone Encounter (Signed)
Speech Therapist from [facility] suggesting this pt get modified barium swallow for swallow difficulties.  [phone] Sonia Baller

## 2014-01-26 ENCOUNTER — Telehealth: Payer: Self-pay | Admitting: Family Medicine

## 2014-01-26 MED ORDER — LEVOFLOXACIN 250 MG PO TABS: 250.0000 mg | ORAL_TABLET | Freq: Every day | ORAL | Status: DC

## 2014-01-26 NOTE — Telephone Encounter (Signed)
Ok for levaquin 250 qd x 10 days- sent erx  Pt needs f/u at 3-5 days office visit

## 2014-01-26 NOTE — Telephone Encounter (Signed)
[name] notified. [name] stated that she will take patient to ER.

## 2014-01-26 NOTE — Telephone Encounter (Signed)
I have only now just received a cxr report from dec 4  Pt with bilat pna  Pt needs to go to ER now for further evaluation , and posisble admit as bilat pna is dangerous and assoc with increased risk of death  06-06-2022 to inform pt, I will do referral/order/rx

## 2014-01-26 NOTE — Telephone Encounter (Signed)
Daughter states patient had a chest xray done at home yesterday by [place] [facility].  He was notified that he had pneumonia.  Daughter would like a call back in regards to what they need to do.

## 2014-01-27 ENCOUNTER — Telehealth: Payer: Self-pay | Admitting: Internal Medicine

## 2014-01-27 ENCOUNTER — Other Ambulatory Visit: Payer: Self-pay

## 2014-01-27 ENCOUNTER — Encounter (HOSPITAL_COMMUNITY): Payer: Self-pay | Admitting: *Deleted

## 2014-01-27 ENCOUNTER — Emergency Department (HOSPITAL_COMMUNITY): Payer: Medicare PPO

## 2014-01-27 ENCOUNTER — Inpatient Hospital Stay (HOSPITAL_COMMUNITY)
Admission: EM | Admit: 2014-01-27 | Discharge: 2014-01-31 | DRG: 291 | Disposition: A | Discharge status: Home Health | Payer: Medicare PPO | Attending: Internal Medicine | Admitting: Internal Medicine

## 2014-01-27 DIAGNOSIS — Z9981 Dependence on supplemental oxygen: Secondary | ICD-10-CM

## 2014-01-27 DIAGNOSIS — Z87891 Personal history of nicotine dependence: Secondary | ICD-10-CM

## 2014-01-27 DIAGNOSIS — I129 Hypertensive chronic kidney disease with stage 1 through stage 4 chronic kidney disease, or unspecified chronic kidney disease: Secondary | ICD-10-CM | POA: Diagnosis present

## 2014-01-27 DIAGNOSIS — I441 Atrioventricular block, second degree: Secondary | ICD-10-CM | POA: Diagnosis present

## 2014-01-27 DIAGNOSIS — D631 Anemia in chronic kidney disease: Secondary | ICD-10-CM | POA: Diagnosis present

## 2014-01-27 DIAGNOSIS — G4733 Obstructive sleep apnea (adult) (pediatric): Secondary | ICD-10-CM | POA: Diagnosis present

## 2014-01-27 DIAGNOSIS — I2510 Atherosclerotic heart disease of native coronary artery without angina pectoris: Secondary | ICD-10-CM | POA: Diagnosis present

## 2014-01-27 DIAGNOSIS — J9621 Acute and chronic respiratory failure with hypoxia: Secondary | ICD-10-CM | POA: Diagnosis present

## 2014-01-27 DIAGNOSIS — Z8546 Personal history of malignant neoplasm of prostate: Secondary | ICD-10-CM | POA: Diagnosis not present

## 2014-01-27 DIAGNOSIS — E1129 Type 2 diabetes mellitus with other diabetic kidney complication: Secondary | ICD-10-CM | POA: Diagnosis present

## 2014-01-27 DIAGNOSIS — Z7982 Long term (current) use of aspirin: Secondary | ICD-10-CM | POA: Diagnosis not present

## 2014-01-27 DIAGNOSIS — Z8673 Personal history of transient ischemic attack (TIA), and cerebral infarction without residual deficits: Secondary | ICD-10-CM | POA: Diagnosis not present

## 2014-01-27 DIAGNOSIS — I482 Chronic atrial fibrillation: Secondary | ICD-10-CM | POA: Diagnosis present

## 2014-01-27 DIAGNOSIS — N184 Chronic kidney disease, stage 4 (severe): Secondary | ICD-10-CM | POA: Diagnosis present

## 2014-01-27 DIAGNOSIS — Z66 Do not resuscitate: Secondary | ICD-10-CM | POA: Diagnosis present

## 2014-01-27 DIAGNOSIS — N183 Chronic kidney disease, stage 3 unspecified: Secondary | ICD-10-CM | POA: Diagnosis present

## 2014-01-27 DIAGNOSIS — I5031 Acute diastolic (congestive) heart failure: Secondary | ICD-10-CM | POA: Diagnosis present

## 2014-01-27 DIAGNOSIS — I5033 Acute on chronic diastolic (congestive) heart failure: Principal | ICD-10-CM

## 2014-01-27 DIAGNOSIS — R0602 Shortness of breath: Secondary | ICD-10-CM | POA: Diagnosis present

## 2014-01-27 DIAGNOSIS — I480 Paroxysmal atrial fibrillation: Secondary | ICD-10-CM | POA: Diagnosis present

## 2014-01-27 DIAGNOSIS — Z79899 Other long term (current) drug therapy: Secondary | ICD-10-CM

## 2014-01-27 DIAGNOSIS — E1122 Type 2 diabetes mellitus with diabetic chronic kidney disease: Secondary | ICD-10-CM | POA: Diagnosis present

## 2014-01-27 DIAGNOSIS — J189 Pneumonia, unspecified organism: Secondary | ICD-10-CM | POA: Diagnosis present

## 2014-01-27 DIAGNOSIS — I10 Essential (primary) hypertension: Secondary | ICD-10-CM | POA: Diagnosis present

## 2014-01-27 DIAGNOSIS — S81802A Unspecified open wound, left lower leg, initial encounter: Secondary | ICD-10-CM | POA: Diagnosis present

## 2014-01-27 DIAGNOSIS — Y95 Nosocomial condition: Secondary | ICD-10-CM | POA: Diagnosis present

## 2014-01-27 DIAGNOSIS — Z993 Dependence on wheelchair: Secondary | ICD-10-CM

## 2014-01-27 DIAGNOSIS — L97919 Non-pressure chronic ulcer of unspecified part of right lower leg with unspecified severity: Secondary | ICD-10-CM | POA: Diagnosis present

## 2014-01-27 DIAGNOSIS — L97929 Non-pressure chronic ulcer of unspecified part of left lower leg with unspecified severity: Secondary | ICD-10-CM | POA: Diagnosis present

## 2014-01-27 DIAGNOSIS — D649 Anemia, unspecified: Secondary | ICD-10-CM | POA: Diagnosis present

## 2014-01-27 DIAGNOSIS — M1990 Unspecified osteoarthritis, unspecified site: Secondary | ICD-10-CM | POA: Diagnosis present

## 2014-01-27 DIAGNOSIS — D638 Anemia in other chronic diseases classified elsewhere: Secondary | ICD-10-CM | POA: Diagnosis present

## 2014-01-27 DIAGNOSIS — Z794 Long term (current) use of insulin: Secondary | ICD-10-CM

## 2014-01-27 LAB — CBC
HCT: 33.2 % — ABNORMAL LOW (ref 39.0–52.0)
HEMATOCRIT: 32.8 % — AB (ref 39.0–52.0)
HEMOGLOBIN: 9.8 g/dL — AB (ref 13.0–17.0)
Hemoglobin: 9.9 g/dL — ABNORMAL LOW (ref 13.0–17.0)
MCH: 24 pg — AB (ref 26.0–34.0)
MCH: 24 pg — AB (ref 26.0–34.0)
MCHC: 29.9 g/dL — AB (ref 30.0–36.0)
MCHC: 29.8 g/dL — ABNORMAL LOW (ref 30.0–36.0)
MCV: 80.4 fL (ref 78.0–100.0)
MCV: 80.6 fL (ref 78.0–100.0)
PLATELETS: 279 10*3/uL (ref 150–400)
Platelets: 269 10*3/uL (ref 150–400)
RBC: 4.08 MIL/uL — ABNORMAL LOW (ref 4.22–5.81)
RBC: 4.12 MIL/uL — ABNORMAL LOW (ref 4.22–5.81)
RDW: 21.8 % — ABNORMAL HIGH (ref 11.5–15.5)
RDW: 21.9 % — AB (ref 11.5–15.5)
WBC: 7 10*3/uL (ref 4.0–10.5)
WBC: 6.8 10*3/uL (ref 4.0–10.5)

## 2014-01-27 LAB — CREATININE, SERUM
Creatinine, Ser: 2.08 mg/dL — ABNORMAL HIGH (ref 0.50–1.35)
GFR calc Af Amer: 33 mL/min — ABNORMAL LOW (ref >90)
GFR calc non Af Amer: 29 mL/min — ABNORMAL LOW (ref >90)

## 2014-01-27 LAB — I-STAT TROPONIN, ED: TROPONIN I, POC: 0.03 ng/mL (ref 0.00–0.08)

## 2014-01-27 LAB — PRO B NATRIURETIC PEPTIDE: Pro B Natriuretic peptide (BNP): 2020 pg/mL — ABNORMAL HIGH (ref 0–450)

## 2014-01-27 LAB — BASIC METABOLIC PANEL
Anion gap: 13 (ref 5–15)
BUN: 68 mg/dL — ABNORMAL HIGH (ref 6–23)
CO2: 23 mEq/L (ref 19–32)
CREATININE: 2.24 mg/dL — AB (ref 0.50–1.35)
Calcium: 9 mg/dL (ref 8.4–10.5)
Chloride: 105 mEq/L (ref 96–112)
GFR calc Af Amer: 30 mL/min — ABNORMAL LOW (ref >90)
GFR calc non Af Amer: 26 mL/min — ABNORMAL LOW (ref >90)
GLUCOSE: 157 mg/dL — AB (ref 70–99)
POTASSIUM: 4.5 meq/L (ref 3.7–5.3)
Sodium: 141 mEq/L (ref 137–147)

## 2014-01-27 LAB — TROPONIN I: Troponin I: <0.3 ng/mL (ref <0.30)

## 2014-01-27 LAB — GLUCOSE, CAPILLARY: Glucose-Capillary: 161 mg/dL — ABNORMAL HIGH (ref 70–99)

## 2014-01-27 LAB — PROCALCITONIN

## 2014-01-27 MED ORDER — VANCOMYCIN HCL 10 G IV SOLR
2000.0000 mg | Freq: Once | INTRAVENOUS | Status: AC
  Administered 2014-01-27: 2000 mg via INTRAVENOUS
  Filled 2014-01-27: qty 2000

## 2014-01-27 MED ORDER — ALLOPURINOL 100 MG PO TABS
100.0000 mg | ORAL_TABLET | Freq: Every day | ORAL | Status: DC
  Administered 2014-01-27 – 2014-01-30 (×4): 100 mg via ORAL
  Filled 2014-01-27 (×6): qty 1

## 2014-01-27 MED ORDER — POTASSIUM CHLORIDE CRYS ER 20 MEQ PO TBCR
20.0000 meq | EXTENDED_RELEASE_TABLET | Freq: Every day | ORAL | Status: DC
  Administered 2014-01-28 – 2014-01-31 (×4): 20 meq via ORAL
  Filled 2014-01-27 (×6): qty 1

## 2014-01-27 MED ORDER — FE FUM-VIT C-VIT B12-FA 460-60-0.01-1 MG PO CAPS: 1.0000 | ORAL_CAPSULE | Freq: Every day | ORAL | Status: DC

## 2014-01-27 MED ORDER — CLONIDINE HCL 0.1 MG/24HR TD PTWK
0.1000 mg | MEDICATED_PATCH | TRANSDERMAL | Status: DC
  Administered 2014-01-28: 0.1 mg via TRANSDERMAL
  Filled 2014-01-27: qty 1

## 2014-01-27 MED ORDER — ALBUTEROL SULFATE (2.5 MG/3ML) 0.083% IN NEBU: 2.5000 mg | INHALATION_SOLUTION | RESPIRATORY_TRACT | Status: DC | PRN

## 2014-01-27 MED ORDER — ASPIRIN EC 325 MG PO TBEC
325.0000 mg | DELAYED_RELEASE_TABLET | Freq: Every day | ORAL | Status: DC
  Administered 2014-01-28 – 2014-01-31 (×4): 325 mg via ORAL
  Filled 2014-01-27 (×4): qty 1

## 2014-01-27 MED ORDER — INSULIN DETEMIR 100 UNIT/ML ~~LOC~~ SOLN
45.0000 [IU] | Freq: Two times a day (BID) | SUBCUTANEOUS | Status: DC
  Administered 2014-01-27 – 2014-01-28 (×3): 45 [IU] via SUBCUTANEOUS
  Filled 2014-01-27 (×5): qty 0.45

## 2014-01-27 MED ORDER — ISOSORBIDE MONONITRATE ER 60 MG PO TB24
60.0000 mg | ORAL_TABLET | Freq: Every day | ORAL | Status: DC
  Administered 2014-01-28 – 2014-01-31 (×4): 60 mg via ORAL
  Filled 2014-01-27 (×5): qty 1

## 2014-01-27 MED ORDER — VANCOMYCIN HCL 10 G IV SOLR: 1500.0000 mg | INTRAVENOUS | Status: DC

## 2014-01-27 MED ORDER — SODIUM CHLORIDE 0.9 % IJ SOLN: 3.0000 mL | INTRAMUSCULAR | Status: DC | PRN

## 2014-01-27 MED ORDER — SODIUM CHLORIDE 0.9 % IV SOLN: 250.0000 mL | INTRAVENOUS | Status: DC | PRN

## 2014-01-27 MED ORDER — FUROSEMIDE 10 MG/ML IJ SOLN
60.0000 mg | Freq: Once | INTRAMUSCULAR | Status: AC
  Administered 2014-01-27: 60 mg via INTRAVENOUS
  Filled 2014-01-27: qty 6

## 2014-01-27 MED ORDER — DEXTROSE 5 % IV SOLN
1.0000 g | INTRAVENOUS | Status: DC
  Filled 2014-01-27: qty 1

## 2014-01-27 MED ORDER — HEPARIN SODIUM (PORCINE) 5000 UNIT/ML IJ SOLN
5000.0000 [IU] | Freq: Three times a day (TID) | INTRAMUSCULAR | Status: DC
  Administered 2014-01-27 – 2014-01-31 (×12): 5000 [IU] via SUBCUTANEOUS
  Filled 2014-01-27 (×13): qty 1

## 2014-01-27 MED ORDER — SODIUM CHLORIDE 0.9 % IJ SOLN
3.0000 mL | Freq: Two times a day (BID) | INTRAMUSCULAR | Status: DC
  Administered 2014-01-28 – 2014-01-31 (×5): 3 mL via INTRAVENOUS

## 2014-01-27 MED ORDER — ONDANSETRON HCL 4 MG/2ML IJ SOLN: 4.0000 mg | Freq: Four times a day (QID) | INTRAMUSCULAR | Status: DC | PRN

## 2014-01-27 MED ORDER — AMIODARONE HCL 100 MG PO TABS
100.0000 mg | ORAL_TABLET | Freq: Every day | ORAL | Status: DC
  Administered 2014-01-28 – 2014-01-31 (×4): 100 mg via ORAL
  Filled 2014-01-27 (×4): qty 1

## 2014-01-27 MED ORDER — MOMETASONE FURO-FORMOTEROL FUM 100-5 MCG/ACT IN AERO
2.0000 | INHALATION_SPRAY | Freq: Two times a day (BID) | RESPIRATORY_TRACT | Status: DC
  Administered 2014-01-27 – 2014-01-31 (×6): 2 via RESPIRATORY_TRACT
  Filled 2014-01-27 (×3): qty 8.8

## 2014-01-27 MED ORDER — CITALOPRAM HYDROBROMIDE 20 MG PO TABS
20.0000 mg | ORAL_TABLET | Freq: Every day | ORAL | Status: DC
  Administered 2014-01-28 – 2014-01-31 (×4): 20 mg via ORAL
  Filled 2014-01-27 (×4): qty 1

## 2014-01-27 MED ORDER — AMLODIPINE BESYLATE 10 MG PO TABS
10.0000 mg | ORAL_TABLET | Freq: Every day | ORAL | Status: DC
  Administered 2014-01-28 – 2014-01-31 (×4): 10 mg via ORAL
  Filled 2014-01-27 (×4): qty 1

## 2014-01-27 MED ORDER — INSULIN ASPART 100 UNIT/ML ~~LOC~~ SOLN
0.0000 [IU] | Freq: Three times a day (TID) | SUBCUTANEOUS | Status: DC
  Administered 2014-01-28 – 2014-01-29 (×2): 2 [IU] via SUBCUTANEOUS
  Administered 2014-01-30 – 2014-01-31 (×2): 1 [IU] via SUBCUTANEOUS

## 2014-01-27 MED ORDER — FUROSEMIDE 10 MG/ML IJ SOLN
60.0000 mg | Freq: Two times a day (BID) | INTRAMUSCULAR | Status: DC
  Administered 2014-01-28 – 2014-01-31 (×6): 60 mg via INTRAVENOUS
  Filled 2014-01-27 (×9): qty 6

## 2014-01-27 MED ORDER — FE FUMARATE-B12-VIT C-FA-IFC PO CAPS
1.0000 | ORAL_CAPSULE | Freq: Every day | ORAL | Status: DC
  Administered 2014-01-28 – 2014-01-31 (×4): 1 via ORAL
  Filled 2014-01-27 (×4): qty 1

## 2014-01-27 MED ORDER — PANTOPRAZOLE SODIUM 40 MG PO TBEC
40.0000 mg | DELAYED_RELEASE_TABLET | Freq: Every day | ORAL | Status: DC
  Administered 2014-01-28 – 2014-01-31 (×4): 40 mg via ORAL
  Filled 2014-01-27 (×4): qty 1

## 2014-01-27 MED ORDER — DEXTROSE 5 % IV SOLN
1.0000 g | Freq: Once | INTRAVENOUS | Status: AC
  Administered 2014-01-27: 1 g via INTRAVENOUS
  Filled 2014-01-27: qty 1

## 2014-01-27 MED ORDER — ACETAMINOPHEN 325 MG PO TABS: 650.0000 mg | ORAL_TABLET | ORAL | Status: DC | PRN

## 2014-01-27 MED ORDER — ATORVASTATIN CALCIUM 40 MG PO TABS
40.0000 mg | ORAL_TABLET | Freq: Every day | ORAL | Status: DC
  Administered 2014-01-28 – 2014-01-31 (×4): 40 mg via ORAL
  Filled 2014-01-27 (×4): qty 1

## 2014-01-27 NOTE — Telephone Encounter (Signed)
LMOVM for pt's daughter [name] to call back. Need to know if pt was seen in ER.

## 2014-01-27 NOTE — Telephone Encounter (Signed)
I dont see where pt was seen in hospital -  MM to contact pt/family - ? Pt taken to Non [facility] facility?  If not, I know I sent a prescription to his pharmacy, but bilat PNA is very serious and usually requires inpatient treatment

## 2014-01-27 NOTE — Progress Notes (Addendum)
ANTIBIOTIC CONSULT NOTE - INITIAL  Pharmacy Consult for vancomycin and cefepime Indication: pneumonia  Allergies  Allergen Reactions  . Ace Inhibitors Other (See Comments)    me  hyposion  . Beta Adrenergic Blockers Other (See Comments)     use cautiously secondary to 2nd degree heart block, hypotension    Patient Measurements: weight 107 kg, height 70 inches   Vital Signs: Temp: 97.6 F (36.4 C) (12/09 1659) Temp Source: Oral (12/09 1659) BP: 140/74 mmHg (12/09 1800) Pulse Rate: 68 (12/09 1800) Intake/Output from previous day:   Intake/Output from this shift:    Labs:  Recent Labs  01/27/14 1709  WBC 7.0  HGB 9.8*  PLT 269  CREATININE 2.24*   CrCl cannot be calculated (Unknown ideal weight.). No results for input(s): VANCOTROUGH, VANCOPEAK, VANCORANDOM, GENTTROUGH, GENTPEAK, GENTRANDOM, TOBRATROUGH, TOBRAPEAK, TOBRARND, AMIKACINPEAK, AMIKACINTROU, AMIKACIN in the last 72 hours.   Microbiology: No results found for this or any previous visit (from the past 720 hour(s)).  Medical History: Past Medical History  Diagnosis Date  . Chronic diastolic heart failure     secondary to diastolic dysfunction EF (previous EF 35-45%)ef 60%4/09  . Arrhythmia     atrial fibrillation /pt on amiodarone,thought to be poor coumadin  . Stroke   . Other and unspecified hyperlipidemia   . Hypertension   . Hypertrophy of prostate without urinary obstruction and other lower urinary tract symptoms (LUTS)   . Osteoarthrosis, unspecified whether generalized or localized, unspecified site   . Type II or unspecified type diabetes mellitus without mention of complication, not stated as uncontrolled   . AV block, Mobitz 1     Intolerance to ACE's / discontiuation of beta blockers  . Obstructive sleep apnea   . Iron deficiency anemia, unspecified     s/p EGD and coloscopy 3/10.gastritis.hemorrhids  . Cerebrovascular accident   . Renal insufficiency   . Obesity   . Allergic rhinitis,  cause unspecified 05/29/2010  . CAD (coronary artery disease)     cath 12/04: mLAD 50-60%, mCFX 20-30%, pRCA 50-60%, mRCA 40%  . Normochromic normocytic anemia 04/21/2013  . Abnormal CXR 04/21/2013  . Hypertensive kidney disease with CKD stage III 04/21/2013  . Prostate cancer     Assessment: Patient is a 80 y.o M presented to the ED with c/o SOB and cough. CXR with noted "interstitial infiltrates."  To start vancomycin for PNA. Scr 2.24 (crcl~27)  Goal of Therapy:  Vancomycin trough level 15-20 mcg/ml  Plan:  1) vancomycin 2gm IV q24h, then 1500 mg IV q48h 2) change cefepime to 1gm IV q24h for renal function   Monie Shere P 01/27/2014,6:59 PM

## 2014-01-27 NOTE — ED Notes (Signed)
Pt reports not feeling well for several days, having sob, cough and fatigue. Pt had xray done and was diagnosed with bilateral pneumonia. spo2 92% at triage, ekg done. Pt reports having hx of chf and having recent swelling to legs and abd.

## 2014-01-27 NOTE — ED Notes (Signed)
Pt monitored by pulse ox, bp cuff, and 12-lead. 
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
## 2014-01-27 NOTE — ED Notes (Signed)
Admitting physician at bedside

## 2014-01-27 NOTE — Telephone Encounter (Signed)
Pt daughter called in and said they are taking pt to ER

## 2014-01-27 NOTE — ED Notes (Signed)
Pt has blisters bilaterally on lower extremities rt weeping.

## 2014-01-27 NOTE — Telephone Encounter (Signed)
Done per emr 

## 2014-01-27 NOTE — Telephone Encounter (Signed)
Spoke to [name], pt's daughter about pt going to ER. [name] stated that pt has decided that he would not go to ER because he is not feeling sick. Stressed that this type of pneumonia is serious and that pt should be seen in ER. [name] wants to wait and f/u with Dr. Jenny Reichmann on 02/04/14 if possible.

## 2014-01-27 NOTE — Telephone Encounter (Signed)
FYI

## 2014-01-27 NOTE — H&P (Signed)
PATIENT DETAILS Name: [name] Age: 80 y.o. Sex: male Date of Birth: [birth year] Admit Date: 01/27/2014 HBZ:JIRCV [name], MD   CHIEF COMPLAINT:  Shortness of breath, worsening lower extremity swelling for the past 4 days  HPI: [name] is a 80 y.o. male with a Past Medical History of chronic diastolic heart failure, chronic respiratory failure on home O2, history of type I second-degree AV block, hypertension, anemia related to myelodysplastic syndrome, diabetes, atrial fibrillation not on anticoagulation (poor anticoagulation candidate) who is bed to wheelchair bound and nonambulatory for the past 3 years who presents today with the above noted complaint. Per patient, for the past 4 days or so he has had worsening of his chronic dyspnea. Patient claims that simple activities like transferring from his bed to his wheelchair causes him to become extremely short of breath. He claims to have chronic orthopnea and sleeps in a recliner and thinks that this is gotten worse as well. He also claims that he has had worsening of his lower extremity edema. He also has a cough that is productive with white phlegm. He denies any fever. Patient lives alone, has family-particularly his daughter keeps a very close eye on him. A home health care RN had seen him a few days back, A outpatient chest x-ray was done 2 days ago which showed pneumonia, patient was started on levofloxacin, however since he did not improve he was advised by his PCP to come to the emergency room for further evaluation and treatment. Patient denies any fever, headache, chest pain, nausea, vomiting or diarrhea.   ALLERGIES:   Allergies  Allergen Reactions  . Ace Inhibitors Other (See Comments)    HYPOTENSION  . Beta Adrenergic Blockers Other (See Comments)     use cautiously secondary to 2nd degree heart block, hypotension    PAST MEDICAL HISTORY: Past Medical History  Diagnosis Date  . Chronic diastolic heart  failure     secondary to diastolic dysfunction EF (previous EF 35-45%)ef 60%4/09  . Arrhythmia     atrial fibrillation /pt on amiodarone,thought to be poor coumadin  . Stroke   . Other and unspecified hyperlipidemia   . Hypertension   . Hypertrophy of prostate without urinary obstruction and other lower urinary tract symptoms (LUTS)   . Osteoarthrosis, unspecified whether generalized or localized, unspecified site   . Type II or unspecified type diabetes mellitus without mention of complication, not stated as uncontrolled   . AV block, Mobitz 1     Intolerance to ACE's / discontiuation of beta blockers  . Obstructive sleep apnea   . Iron deficiency anemia, unspecified     s/p EGD and coloscopy 3/10.gastritis.hemorrhids  . Cerebrovascular accident   . Renal insufficiency   . Obesity   . Allergic rhinitis, cause unspecified 05/29/2010  . CAD (coronary artery disease)     cath 12/04: mLAD 50-60%, mCFX 20-30%, pRCA 50-60%, mRCA 40%  . Normochromic normocytic anemia 04/21/2013  . Abnormal CXR 04/21/2013  . Hypertensive kidney disease with CKD stage III 04/21/2013  . Prostate cancer     PAST SURGICAL HISTORY: Past Surgical History  Procedure Laterality Date  . Prostate surgery    . Prostate surgury      hx of  . Esophagogastroduodenoscopy N/A 04/21/2013    Procedure: ESOPHAGOGASTRODUODENOSCOPY (EGD);  Surgeon: Irene Shipper, MD;  Location: [facility];  Service: Endoscopy;  Laterality: N/A;    MEDICATIONS AT HOME: Prior to Admission medications  Medication Sig Start Date End Date Taking? Authorizing Provider  allopurinol (ZYLOPRIM) 100 MG tablet Take 1 tablet (100 mg total) by mouth at bedtime. 12/30/13   Biagio Borg, MD  amiodarone (PACERONE) 200 MG tablet Take 100 mg by mouth every morning.    Historical Provider, MD  amLODipine (NORVASC) 10 MG tablet Take 10 mg by mouth daily.    Historical Provider, MD  aspirin EC 325 MG tablet Take 325 mg by mouth daily.    Historical Provider, MD    atorvastatin (LIPITOR) 40 MG tablet TAKE 1 TABLET BY MOUTH EVERY DAY 12/10/13   Jolaine Artist, MD  Blood Glucose Monitoring Suppl (ONE TOUCH ULTRA 2) W/DEVICE KIT Use as directed twice daily 01/06/14   Biagio Borg, MD  citalopram (CELEXA) 10 MG tablet Take 20 mg by mouth daily.  07/29/12   Biagio Borg, MD  cloNIDine (CATAPRES - DOSED IN MG/24 HR) 0.1 mg/24hr patch Place 1 patch (0.1 mg total) onto the skin once a week. Changes patch on Thursday's 10/02/13   Jolaine Artist, MD  darbepoetin (ARANESP) 300 MCG/0.6ML SOLN injection Inject 300 mcg into the skin every 14 (fourteen) days.    Historical Provider, MD  Fe Fum-Vit C-Vit B12-FA (TRIGELS-F) 460-60-0.01-1 MG CAPS capsule Take 1 capsule by mouth daily. 02/16/13   Shaune Pascal Bensimhon, MD  Fluticasone-Salmeterol (ADVAIR) 100-50 MCG/DOSE AEPB Inhale 1 puff into the lungs as needed. 06/05/12   Renato Shin, MD  Insulin Detemir (LEVEMIR) 100 UNIT/ML Pen 55 units sq twice per day 01/06/14   Biagio Borg, MD  isosorbide mononitrate (IMDUR) 60 MG 24 hr tablet Take 1 tablet (60 mg total) by mouth daily. 07/27/13   Jolaine Artist, MD  levofloxacin (LEVAQUIN) 250 MG tablet Take 1 tablet (250 mg total) by mouth daily. 01/26/14   Biagio Borg, MD  metolazone (ZAROXOLYN) 2.5 MG tablet Take 1 tablet (2.5 mg total) by mouth daily as needed (for weight gain, take 1 potassium tablet with this as well). 12/22/13   Jolaine Artist, MD  omeprazole (PRILOSEC) 20 MG capsule Take 20 mg by mouth daily. 08/07/13   Jolaine Artist, MD  potassium chloride SA (K-DUR,KLOR-CON) 20 MEQ tablet Take 20 mEq by mouth daily as needed. Along with metolazone as needed once daily for weight gain    Historical Provider, MD  silver sulfADIAZINE (SILVADENE) 1 % cream Apply topically daily. Topical, Daily, For 21 days Apply to Bilateral LE ulcerations twice daily for three days (Wednesday through Friday) then decrease dressing changes to daily and continue for 21 days. 10/26/13    Modena Jansky, MD  torsemide (DEMADEX) 20 MG tablet Take 3 tablets (7m) in am and 2 tablets (485m in pm 12/11/13   AlRande BruntNP    FAMILY HISTORY: Family History  Problem Relation Age of Onset  . Heart disease Father   . Dementia Mother   . CAD Sister     SOCIAL HISTORY:  reports that he has quit smoking. He has never used smokeless tobacco. He reports that he does not drink alcohol or use illicit drugs.  REVIEW OF SYSTEMS:  Constitutional:   No  weight loss, night sweats,  Fevers, chills  HEENT:    No headaches, Difficulty swallowing,Tooth/dental problems,Sore throat   Cardio-vascular: No chest pain, dizziness, palpitations  GI:  No heartburn, indigestion, abdominal pain, nausea, vomiting, diarrhea, change in  bowel habits, loss of appetite  Resp: No coughing up of blood.No chest wall  deformity  Skin:  no rash or lesions.  GU:  no dysuria, change in color of urine, no urgency or frequency.  No flank pain.  Musculoskeletal: No joint pain or swelling.  No decreased range of motion.  No back pain.  Psych: No change in mood or affect. No depression or anxiety.  No memory loss.   PHYSICAL EXAM: Blood pressure 140/74, pulse 68, temperature 97.6 F (36.4 C), temperature source Oral, resp. rate 26, SpO2 97 %.  General appearance :Awake, alert, in mild distress, but not using any accessory muscles, speaking in full sentences . Speech Clear. Not toxic Looking HEENT: Atraumatic and Normocephalic, pupils equally reactive to light and accomodation Neck: supple, no JVD. No cervical lymphadenopathy.  Chest:Good air entry bilaterally, bibasilar rales CVS: S1 S2 regular, no murmurs.  Abdomen: Bowel sounds present, Non tender and not distended with no gaurding, rigidity or rebound. Extremities: B/L Lower Ext shows ++edema, both legs are warm to touch. Left >right swelling-chronic per patient Neurology: , Non focal-but with chronic gen weakness Skin:No  Rash Wounds:N/A  LABS ON ADMISSION:   Recent Labs  01/27/14 1709  NA 141  K 4.5  CL 105  CO2 23  GLUCOSE 157*  BUN 68*  CREATININE 2.24*  CALCIUM 9.0   No results for input(s): AST, ALT, ALKPHOS, BILITOT, PROT, ALBUMIN in the last 72 hours. No results for input(s): LIPASE, AMYLASE in the last 72 hours.  Recent Labs  01/27/14 1709  WBC 7.0  HGB 9.8*  HCT 32.8*  MCV 80.4  PLT 269   No results for input(s): CKTOTAL, CKMB, CKMBINDEX, TROPONINI in the last 72 hours. No results for input(s): DDIMER in the last 72 hours. Invalid input(s): POCBNP   RADIOLOGIC STUDIES ON ADMISSION: Dg Chest 2 View  01/27/2014   CLINICAL DATA:  Shortness of breath.  EXAM: CHEST  2 VIEW  COMPARISON:  11/28/2013 and 10/24/2013 and CT scan dated 10/21/2013  FINDINGS: The patient has interstitial infiltrates, right greater than left superimposed on severe chronic lung disease. There are tiny bilateral pleural effusions. There is peribronchial thickening as well as thickening of the fissures. The finding is most consistent with pulmonary edema superimposed on severe chronic lung disease.  IMPRESSION: Probable pulmonary edema superimposed on severe chronic lung disease.   Electronically Signed   By: Rozetta Nunnery M.D.   On: 01/27/2014 18:45     EKG: Independently reviewed. Poor quality-suspected type I second-degree AV block   ASSESSMENT AND PLAN: Present on Admission:  . Acute diastolic CHF (congestive heart failure): Will admit to stepdown unit for closer monitoring, start Lasix 60 mg IV twice daily. Daily weights, strict intake output. Monitor renal function as history of stage IV chronic kidney disease. Follow clinical course, may need cardiology evaluation while inpatient.   Marland Kitchen HCAP (healthcare-associated pneumonia): Chest x-ray shows bilateral pulmonary infiltrates, not sure whether this is acute diastolic heart failure or pneumonia. Already received 2 doses of Levaquin prior to this  hospitalization, we will empirically place on vancomycin/cefepime and treat as hospital-acquired pneumonia. Have ordered pro-calcitonin, please follow. Please taper antibiotics accordingly.  . Acute on chronic respiratory failure with hypoxemia: Multifactorial, secondary to acute diastolic heart failure and healthcare associated pneumonia. Continue oxygen as needed (on home O2), BiPAP when necessary. Apparently on CPAP daily at bedtime. Monitor in stepdown for now.  . Type 2 diabetes mellitus with renal manifestations: Continue with Levemir have decreased dose to 45 units twice a day, continue SSI. Follow CBGs and make further adjustments.   Marland Kitchen  Paroxysmal atrial fibrillation: Chronic issue, not considered to be a anticoagulation candidate per prior cardiology notes. Has history of severe anemia, and prior history of alcohol use.   . Anemia of chronic disease: Multifactorial, likely secondary to myelodysplastic syndrome, anemia of chronic renal disease. Per hematology note, patient is transfusion dependent, and is on iron and erythropoietin supplementation. Please continue to monitor CBC closely.   . CHRONIC KIDNEY DISEASE STAGE III (MODERATE): Monitor electrolytes closely while on IV Lasix. Current creatinine close to usual baseline.   . Essential hypertension: Continue usual antihypertensive regimen. Avoid beta blockers and calcium channel blockers secondary to type I second-degree AV block.  . Second degree AV block, Mobitz type I: Chronic issue, we will repeat EKG in a.m. Monitor in telemetry. Continue low-dose amiodarone, avoid initiation of other AV nodal blocking agents like beta blockers/Cardizem.   Marland Kitchen Open wound of left lower leg: Wound care evaluation ordered.   . Obstructive sleep apnea: CPAP daily at bedtime.  Further plan will depend as patient's clinical course evolves and further radiologic and laboratory data become available. Patient will be monitored closely.   Above noted plan  was discussed with patient and daughter [name] over the phone ([phone]), they were in agreement.   DVT Prophylaxis: Prophylactic  Heparin  Code Status: DNR-reconfirmed with patient and daughter  Disposition Plan: Home with home health services  Total time spent for admission equals 45 minutes.  [facility] Pager [phone]  If 7PM-7AM, please contact night-coverage [email or web address] Password [facility] 01/27/2014, 7:47 PM

## 2014-01-28 DIAGNOSIS — I10 Essential (primary) hypertension: Secondary | ICD-10-CM

## 2014-01-28 DIAGNOSIS — E1121 Type 2 diabetes mellitus with diabetic nephropathy: Secondary | ICD-10-CM

## 2014-01-28 DIAGNOSIS — I441 Atrioventricular block, second degree: Secondary | ICD-10-CM

## 2014-01-28 DIAGNOSIS — I480 Paroxysmal atrial fibrillation: Secondary | ICD-10-CM

## 2014-01-28 DIAGNOSIS — G4733 Obstructive sleep apnea (adult) (pediatric): Secondary | ICD-10-CM

## 2014-01-28 DIAGNOSIS — N183 Chronic kidney disease, stage 3 (moderate): Secondary | ICD-10-CM

## 2014-01-28 LAB — CBC
HEMATOCRIT: 33 % — AB (ref 39.0–52.0)
Hemoglobin: 9.8 g/dL — ABNORMAL LOW (ref 13.0–17.0)
MCH: 24.1 pg — ABNORMAL LOW (ref 26.0–34.0)
MCHC: 29.7 g/dL — AB (ref 30.0–36.0)
MCV: 81.1 fL (ref 78.0–100.0)
Platelets: 281 10*3/uL (ref 150–400)
RBC: 4.07 MIL/uL — ABNORMAL LOW (ref 4.22–5.81)
RDW: 22 % — AB (ref 11.5–15.5)
WBC: 7 10*3/uL (ref 4.0–10.5)

## 2014-01-28 LAB — STREP PNEUMONIAE URINARY ANTIGEN: STREP PNEUMO URINARY ANTIGEN: NEGATIVE

## 2014-01-28 LAB — COMPREHENSIVE METABOLIC PANEL
ALBUMIN: 2.9 g/dL — AB (ref 3.5–5.2)
ALT: 17 U/L (ref 0–53)
ANION GAP: 15 (ref 5–15)
AST: 13 U/L (ref 0–37)
Alkaline Phosphatase: 96 U/L (ref 39–117)
BILIRUBIN TOTAL: 0.3 mg/dL (ref 0.3–1.2)
BUN: 60 mg/dL — AB (ref 6–23)
CHLORIDE: 105 meq/L (ref 96–112)
CO2: 22 mEq/L (ref 19–32)
CREATININE: 2.13 mg/dL — AB (ref 0.50–1.35)
Calcium: 9.1 mg/dL (ref 8.4–10.5)
GFR calc Af Amer: 32 mL/min — ABNORMAL LOW (ref >90)
GFR calc non Af Amer: 28 mL/min — ABNORMAL LOW (ref >90)
Glucose, Bld: 161 mg/dL — ABNORMAL HIGH (ref 70–99)
Potassium: 4 mEq/L (ref 3.7–5.3)
Sodium: 142 mEq/L (ref 137–147)
Total Protein: 7.4 g/dL (ref 6.0–8.3)

## 2014-01-28 LAB — GLUCOSE, CAPILLARY
GLUCOSE-CAPILLARY: 129 mg/dL — AB (ref 70–99)
Glucose-Capillary: 110 mg/dL — ABNORMAL HIGH (ref 70–99)
Glucose-Capillary: 160 mg/dL — ABNORMAL HIGH (ref 70–99)
Glucose-Capillary: 112 mg/dL — ABNORMAL HIGH (ref 70–99)

## 2014-01-28 LAB — HIV ANTIBODY (ROUTINE TESTING W REFLEX): HIV 1&2 Ab, 4th Generation: NONREACTIVE

## 2014-01-28 LAB — LEGIONELLA ANTIGEN, URINE

## 2014-01-28 LAB — HEMOGLOBIN A1C
Hgb A1c MFr Bld: 7.1 % — ABNORMAL HIGH (ref <5.7)
MEAN PLASMA GLUCOSE: 157 mg/dL — AB (ref <117)

## 2014-01-28 LAB — TROPONIN I: Troponin I: <0.3 ng/mL (ref <0.30)

## 2014-01-28 LAB — MRSA PCR SCREENING: MRSA by PCR: POSITIVE — AB

## 2014-01-28 MED ORDER — CHLORHEXIDINE GLUCONATE CLOTH 2 % EX PADS
6.0000 | MEDICATED_PAD | Freq: Every day | CUTANEOUS | Status: DC
  Administered 2014-01-28 – 2014-01-31 (×3): 6 via TOPICAL

## 2014-01-28 MED ORDER — METOLAZONE 5 MG PO TABS
5.0000 mg | ORAL_TABLET | Freq: Once | ORAL | Status: AC
  Administered 2014-01-28: 5 mg via ORAL
  Filled 2014-01-28: qty 1

## 2014-01-28 MED ORDER — MUPIROCIN 2 % EX OINT
1.0000 "application " | TOPICAL_OINTMENT | Freq: Two times a day (BID) | CUTANEOUS | Status: DC
  Administered 2014-01-28 – 2014-01-31 (×7): 1 via NASAL
  Filled 2014-01-28 (×2): qty 22

## 2014-01-28 NOTE — Progress Notes (Signed)
Increased pressure to 8cmH2O and 4L of O2 due to desaturation in the mid 80's

## 2014-01-28 NOTE — Plan of Care (Signed)
Problem: Phase I Progression Outcomes Goal: Pain controlled with appropriate interventions Outcome: Completed/Met Date Met:  01/28/14 Goal: Initial discharge plan identified Outcome: Completed/Met Date Met:  01/28/14 Goal: Voiding-avoid urinary catheter unless indicated Outcome: Completed/Met Date Met:  01/28/14

## 2014-01-28 NOTE — Consult Note (Signed)
WOC wound consult note Reason for Consult: evaluation LE wounds.  Pt reports care for ulcerations at home but it is unclear if this patient has had compression wraps on his legs in the past. He has bilateral palpable pulses and it appears that much of the edema that may have been present has improved.    Wound type: venous stasis ulcerations  Measurement: 3 open areas on the posterior RLE, largest is 2.5cm x 1.5cm x 0.2cm with 2 smaller areas that proximal to this. One open area on the LLE, medial 1.5cm x 1.5cm x 0.2cm.  Several areas that are healing and scarred. Wound bed: mostly clean, with pink tissue Drainage (amount, consistency, odor) minimal, slight odor from the LLE  Periwound: intact  Dressing procedure/placement/frequency: Xeroform to the open areas, cover with dry dressing, secure with kerlix.  If compression to be considered would need ABI to determine arterial blood flow for safety with compression.  Discussed POC with patient and bedside nurse.  Re consult if needed, will not follow at this time. Thanks  [name], [name] [phone])

## 2014-01-28 NOTE — Progress Notes (Signed)
Transferred pt per bed with all belongings to [facility] bed [facility]. RN aware that pt is here. Pt oriented to room and given Nurse call button - shown how to call for help

## 2014-01-28 NOTE — Progress Notes (Signed)
Pt placed on auto titrate CPAP with 2L of O2 bleed in. RT will continue to monitor.

## 2014-01-28 NOTE — Progress Notes (Signed)
Report callled to 5 [place]. Spoke with [employer]. Pt will be transferred in bed.

## 2014-01-28 NOTE — Plan of Care (Signed)
Problem: Phase II Progression Outcomes Goal: Pain controlled Outcome: Progressing                  

## 2014-01-28 NOTE — Progress Notes (Signed)
[facility] PROGRESS NOTE   [name] QAS:[id number] DOB: August 06, 1932 DOA: 01/27/2014 PCP: [name], MD  HPI/Subjective: Denies any shortness of breath, denies any fever or chills.  Assessment/Plan: Principal Problem:   Acute diastolic CHF (congestive heart failure) Active Problems:   Type 2 diabetes mellitus with renal manifestations   Obstructive sleep apnea   Essential hypertension   Paroxysmal atrial fibrillation   CHRONIC KIDNEY DISEASE STAGE III (MODERATE)   PROSTATE CANCER, HX OF   Open wound of left lower leg   Anemia of chronic disease   HCAP (healthcare-associated pneumonia)   Acute on chronic respiratory failure with hypoxemia   Second degree AV block, Mobitz type I    Acute on chronic diastolic CHF Patient has been on high dose of Bumex and Zaroxolyn as outpatient, still came in with pulmonary edema. Started on 60 mg of IV Lasix twice a day. Track daily weights, intake and output. Patient has stage IV chronic kidney disease, followed renal function closely. Check BMP in a.m.  ?HCAP Chest x-ray shows bilateral infiltrates, unclear whether pulmonary edema alone or with pneumonia. Patient started on vancomycin and cefepime, treated as hospital-acquired pneumonia. No fever, no leukocytosis, procalcitonin is less than 0.1, I well discontinue antibiotics.  Acute on chronic respiratory failure with hypoxia Patient is on home oxygen, uses CPAP at night. This is likely multifactorial secondary to acute diastolic CHF, better than yesterday.  Diabetes mellitus type 2 with renal manifestation Continue home dose of Levemir insulin, check hemoglobin A1c. Carbohydrate modified diet and SSI.  Paroxysmal atrial fibrillation Stable and chronic, not on anticoagulation because of history of bleeding, overall poor candidate. Rate is controlled.  Chronic kidney disease stage III Baseline creatinine is around 2-2.3, patient is around baseline. Started on IV  Lasix, continue to monitor electrolytes, check BMP in a.m.  Mobitz type I second-degree AV block Chronic issue, continue anemia.  Open wound of the left lower leg Seen by [facility] team, follow recommendations.  Code Status: Full code Family Communication: Plan discussed with the patient. Disposition Plan: Remains inpatient, transfer to telemetry.   Consultants:  None  Procedures:  None  Antibiotics:  None   Objective: Filed Vitals:   01/28/14 0817  BP: 162/81  Pulse: 73  Temp: 98.2 F (36.8 C)  Resp: 17    Intake/Output Summary (Last 24 hours) at 01/28/14 0857 Last data filed at 01/28/14 7989  Gross per 24 hour  Intake    500 ml  Output   1800 ml  Net  -1300 ml   Filed Weights   01/27/14 2101  Weight: 107.9 kg (237 lb 14 oz)    Exam: General: Alert and awake, oriented x3, not in any acute distress. HEENT: anicteric sclera, pupils reactive to light and accommodation, EOMI CVS: S1-S2 clear, no murmur rubs or gallops Chest: clear to auscultation bilaterally, no wheezing, rales or rhonchi Abdomen: soft nontender, nondistended, normal bowel sounds, no organomegaly Extremities: no cyanosis, clubbing or edema noted bilaterally Neuro: Cranial nerves II-XII intact, no focal neurological deficits  Data Reviewed: Basic Metabolic Panel:  Recent Labs Lab 01/27/14 1709 01/27/14 2222 01/28/14 0258  NA 141  --  142  K 4.5  --  4.0  CL 105  --  105  CO2 23  --  22  GLUCOSE 157*  --  161*  BUN 68*  --  60*  CREATININE 2.24* 2.08* 2.13*  CALCIUM 9.0  --  9.1   Liver Function Tests:  Recent Labs Lab 01/28/14 0258  AST 13  ALT 17  ALKPHOS 96  BILITOT 0.3  PROT 7.4  ALBUMIN 2.9*   No results for input(s): LIPASE, AMYLASE in the last 168 hours. No results for input(s): AMMONIA in the last 168 hours. CBC:  Recent Labs Lab 01/27/14 1709 01/27/14 2222 01/28/14 0258  WBC 7.0 6.8 7.0  HGB 9.8* 9.9* 9.8*  HCT 32.8* 33.2* 33.0*  MCV 80.4 80.6 81.1  PLT  269 279 281   Cardiac Enzymes:  Recent Labs Lab 01/27/14 2222 01/28/14 0258  TROPONINI <0.30 <0.30   BNP (last 3 results)  Recent Labs  10/21/13 2012 11/28/13 1215 01/27/14 1709  PROBNP 1541.0* 1597.0* 2020.0*   CBG:  Recent Labs Lab 01/27/14 2201 01/28/14 0816  GLUCAP 161* 110*    Micro Recent Results (from the past 240 hour(s))  MRSA PCR Screening     Status: Abnormal   Collection Time: 01/27/14  9:01 PM  Result Value Ref Range Status   MRSA by PCR POSITIVE (A) NEGATIVE Final    Comment:        The GeneXpert MRSA Assay (FDA approved for NASAL specimens only), is one component of a comprehensive MRSA colonization surveillance program. It is not intended to diagnose MRSA infection nor to guide or monitor treatment for MRSA infections. RESULT CALLED TO, READ BACK BY AND VERIFIED WITH: M.EVANGELISTA,RN AT 0145 BY L.PITT 01/28/14      Studies: Dg Chest 2 View  01/27/2014   CLINICAL DATA:  Shortness of breath.  EXAM: CHEST  2 VIEW  COMPARISON:  11/28/2013 and 10/24/2013 and CT scan dated 10/21/2013  FINDINGS: The patient has interstitial infiltrates, right greater than left superimposed on severe chronic lung disease. There are tiny bilateral pleural effusions. There is peribronchial thickening as well as thickening of the fissures. The finding is most consistent with pulmonary edema superimposed on severe chronic lung disease.  IMPRESSION: Probable pulmonary edema superimposed on severe chronic lung disease.   Electronically Signed   By: Rozetta Nunnery M.D.   On: 01/27/2014 18:45    Scheduled Meds: . allopurinol  100 mg Oral QHS  . amiodarone  100 mg Oral Daily  . amLODipine  10 mg Oral Daily  . aspirin EC  325 mg Oral Daily  . atorvastatin  40 mg Oral Daily  . ceFEPime (MAXIPIME) IV  1 g Intravenous Q24H  . Chlorhexidine Gluconate Cloth  6 each Topical Q0600  . citalopram  20 mg Oral Daily  . cloNIDine  0.1 mg Transdermal Q Thu  . ferrous  [id number] C-folic acid  1 capsule Oral Daily  . furosemide  60 mg Intravenous BID  . heparin  5,000 Units Subcutaneous 3 times per day  . insulin aspart  0-9 Units Subcutaneous TID WC  . insulin detemir  45 Units Subcutaneous BID  . isosorbide mononitrate  60 mg Oral Daily  . mometasone-formoterol  2 puff Inhalation BID  . mupirocin ointment  1 application Nasal BID  . pantoprazole  40 mg Oral Daily  . potassium chloride SA  20 mEq Oral Daily  . sodium chloride  3 mL Intravenous Q12H  . [START ON 01/29/2014] vancomycin  1,500 mg Intravenous Q48H   Continuous Infusions:      Time spent: 35 minutes    [facility]  [facility] Pager [phone] If 7PM-7AM, please contact night-coverage at [email or web address], password [facility] 01/28/2014, 8:57 AM  LOS: 1 day

## 2014-01-28 NOTE — Progress Notes (Signed)
Utilization Review Completed.  

## 2014-01-28 NOTE — Evaluation (Signed)
Physical Therapy Evaluation Patient Details Name: [name] MRN: [id number] DOB: Sep 29, 1932 Today's Date: 01/28/2014   History of Present Illness  80 year old male with past medical history significant for hypertension, diabetes, iron deficiency anemia, congestive heart failure, prior CVA, coronary artery disease, chronic kidney disease, presenting to the ED for shortness of breath. Likely combination of pneumonia and CHF exacerbation per MD.  Clinical Impression  Patient very pleasent 80 yo male who presents with deficits as indicated below. Will benefit from continued skilled PT to address deficits and maximize function. Will see as indicated and progress as tolerated. Recommend HHPT upon acute discharge.      Follow Up Recommendations Home health PT;Supervision for mobility/OOB    Equipment Recommendations  None recommended by PT    Recommendations for Other Services       Precautions / Restrictions Precautions Precautions: Fall      Mobility  Bed Mobility Overal bed mobility: Needs Assistance Bed Mobility: Supine to Sit     Supine to sit: Min assist     General bed mobility comments: assist to pull to upright, VCs for breathing and positioning  Transfers Overall transfer level: Needs assistance Equipment used: None Transfers: Sit to/from [facility] Sit to Stand: Min guard Stand pivot transfers: Min guard       General transfer comment: No physical assist required, min guard for stability and safety  Ambulation/Gait             General Gait Details: non-ambulatory recently secondary to deconditioning and fatigue.  Stairs            Wheelchair Mobility    Modified Rankin (Stroke Patients Only)       Balance                                             Pertinent Vitals/Pain Pain Assessment: No/denies pain    Home Living Family/patient expects to be discharged to:: Private residence Living  Arrangements: Alone Available Help at Discharge: Family;Available PRN/intermittently Type of Home: Apartment Home Access: Ramped entrance     Home Layout: One level Home Equipment: Wheelchair - power;Walker - 4 wheels Additional Comments: uses wheelchair inside apt; walker in community    Prior Function           Comments: bed to powerchair      Hand Dominance   Dominant Hand: Right    Extremity/Trunk Assessment   Upper Extremity Assessment: Generalized weakness           Lower Extremity Assessment: Generalized weakness         Communication   Communication: Expressive difficulties  Cognition Arousal/Alertness: Awake/alert Behavior During Therapy: WFL for tasks assessed/performed                        General Comments General comments (skin integrity, edema, etc.): educatedo n pursed lip breathing and deep inspriation through [place]. Performed with teachback several times    Exercises General Exercises - Lower Extremity Ankle Circles/Pumps: AROM;Both;10 reps Long Arc Quad: AROM;Both;10 reps Straight Leg Raises: AROM;Both;10 reps (from reclined position in chair, assist needed) Hip Flexion/Marching: AROM;Both;10 reps      Assessment/Plan    PT Assessment Patient needs continued PT services  PT Diagnosis Generalized weakness;Difficulty walking   PT Problem List Decreased strength;Decreased range of motion;Decreased activity tolerance;Decreased balance;Decreased mobility;Cardiopulmonary status limiting  activity  PT Treatment Interventions DME instruction;Gait training;Functional mobility training;Therapeutic activities;Therapeutic exercise;Balance training;Patient/family education   PT Goals (Current goals can be found in the Care Plan section) Acute Rehab PT Goals Patient Stated Goal: to get back home PT Goal Formulation: With patient Time For Goal Achievement: 02/11/14 Potential to Achieve Goals: Good    Frequency Min 3X/week   Barriers to  discharge        Co-evaluation               End of Session Equipment Utilized During Treatment: Gait belt;Oxygen Activity Tolerance: Patient tolerated treatment well;Patient limited by fatigue Patient left: in chair;with call bell/phone within reach Nurse Communication: Mobility status         Time: 2924-4628 PT Time Calculation (min) (ACUTE ONLY): 27 min   Charges:   PT Evaluation $Initial PT Evaluation Tier I: 1 Procedure PT Treatments $Therapeutic Exercise: 8-22 mins $Therapeutic Activity: 8-22 mins   PT G CodesDuncan Dull 01/28/2014, 10:29 AM Alben Deeds, PT DPT  [phone]

## 2014-01-28 NOTE — Progress Notes (Signed)
[facility]  Patient Status: Active (receiving services up to time of hospitalization)  [facility] is providing the following services: RN and ST  If patient discharges after hours, please call [phone].   [name] 01/28/2014, 9:21 AM

## 2014-01-29 DIAGNOSIS — Z8546 Personal history of malignant neoplasm of prostate: Secondary | ICD-10-CM

## 2014-01-29 DIAGNOSIS — S81802D Unspecified open wound, left lower leg, subsequent encounter: Secondary | ICD-10-CM

## 2014-01-29 LAB — BASIC METABOLIC PANEL
ANION GAP: 14 (ref 5–15)
BUN: 61 mg/dL — AB (ref 6–23)
CHLORIDE: 106 meq/L (ref 96–112)
CO2: 25 meq/L (ref 19–32)
Calcium: 9.1 mg/dL (ref 8.4–10.5)
Creatinine, Ser: 2.08 mg/dL — ABNORMAL HIGH (ref 0.50–1.35)
GFR calc non Af Amer: 29 mL/min — ABNORMAL LOW (ref >90)
GFR, EST AFRICAN AMERICAN: 33 mL/min — AB (ref >90)
Glucose, Bld: 67 mg/dL — ABNORMAL LOW (ref 70–99)
POTASSIUM: 4.1 meq/L (ref 3.7–5.3)
Sodium: 145 mEq/L (ref 137–147)

## 2014-01-29 LAB — CBC
HEMATOCRIT: 34 % — AB (ref 39.0–52.0)
Hemoglobin: 9.9 g/dL — ABNORMAL LOW (ref 13.0–17.0)
MCH: 23.7 pg — AB (ref 26.0–34.0)
MCHC: 29.1 g/dL — AB (ref 30.0–36.0)
MCV: 81.5 fL (ref 78.0–100.0)
Platelets: 295 10*3/uL (ref 150–400)
RBC: 4.17 MIL/uL — AB (ref 4.22–5.81)
RDW: 22.2 % — ABNORMAL HIGH (ref 11.5–15.5)
WBC: 6.6 10*3/uL (ref 4.0–10.5)

## 2014-01-29 LAB — GLUCOSE, CAPILLARY
GLUCOSE-CAPILLARY: 58 mg/dL — AB (ref 70–99)
GLUCOSE-CAPILLARY: 127 mg/dL — AB (ref 70–99)
Glucose-Capillary: 129 mg/dL — ABNORMAL HIGH (ref 70–99)
Glucose-Capillary: 159 mg/dL — ABNORMAL HIGH (ref 70–99)
Glucose-Capillary: 131 mg/dL — ABNORMAL HIGH (ref 70–99)

## 2014-01-29 MED ORDER — HYDRALAZINE HCL 50 MG PO TABS
50.0000 mg | ORAL_TABLET | Freq: Three times a day (TID) | ORAL | Status: DC
  Administered 2014-01-29 – 2014-01-31 (×6): 50 mg via ORAL
  Filled 2014-01-29 (×9): qty 1

## 2014-01-29 MED ORDER — INSULIN DETEMIR 100 UNIT/ML ~~LOC~~ SOLN
40.0000 [IU] | Freq: Two times a day (BID) | SUBCUTANEOUS | Status: DC
  Administered 2014-01-29 – 2014-01-31 (×4): 40 [IU] via SUBCUTANEOUS
  Filled 2014-01-29 (×5): qty 0.4

## 2014-01-29 MED ORDER — METOLAZONE 5 MG PO TABS
5.0000 mg | ORAL_TABLET | Freq: Once | ORAL | Status: AC
  Administered 2014-01-29: 5 mg via ORAL
  Filled 2014-01-29: qty 1

## 2014-01-29 NOTE — Plan of Care (Signed)
Problem: Phase I Progression Outcomes Goal: Hemodynamically stable Outcome: Progressing Goal: Other Phase I Outcomes/Goals Outcome: Not Applicable Date Met:  01/29/14  Problem: Phase II Progression Outcomes Goal: Pain controlled Outcome: Not Applicable Date Met:  01/29/14 Patient without complaints of pain. Goal: Discharge plan established Outcome: Progressing Goal: Tolerating diet Outcome: Completed/Met Date Met:  01/29/14     

## 2014-01-29 NOTE — Progress Notes (Signed)
Patient BS 60 this am - juice given - patient asymptomatic - MD notified - will recheck BS.

## 2014-01-29 NOTE — Plan of Care (Signed)
Problem: Phase I Progression Outcomes Goal: Up in chair, BRP Outcome: Progressing     

## 2014-01-29 NOTE — Progress Notes (Signed)
Physical Therapy Treatment Patient Details Name: [name] MRN: [id number] DOB: [birth year] Today's Date: 01/29/2014    History of Present Illness 80 year old male with past medical history significant for hypertension, diabetes, iron deficiency anemia, congestive heart failure, prior CVA, coronary artery disease, chronic kidney disease, presenting to the ED for shortness of breath. Likely combination of pneumonia and CHF exacerbation per MD.    PT Comments    Pt progressing towards physical therapy goals. Pt was able to walk ~5 feet from bed to chair. Pt previously has stated that he is non-ambulatory, yet today states he was ambulating a good distance with HHPT prior to his d/c from PT within the last couple months. Feel pt could have gained more distance, however would recommend +2 assist for gait training in following sessions.   Follow Up Recommendations  Home health PT;Supervision for mobility/OOB     Equipment Recommendations  None recommended by PT    Recommendations for Other Services       Precautions / Restrictions Precautions Precautions: Fall Restrictions Weight Bearing Restrictions: No    Mobility  Bed Mobility Overal bed mobility: Needs Assistance Bed Mobility: Supine to Sit     Supine to sit: Supervision     General bed mobility comments: No physical assist. Pt able to transition to full sitting position with use of bed rails and increased time. Supervision for safety.   Transfers Overall transfer level: Needs assistance Equipment used: Rolling walker (2 wheeled) Transfers: Sit to/from Stand Sit to Stand: Min guard         General transfer comment: No physical assist required, min guard for stability and safety  Ambulation/Gait Ambulation/Gait assistance: Min assist Ambulation Distance (Feet): 5 Feet Assistive device: Rolling walker (2 wheeled) Gait Pattern/deviations: Step-to pattern;Decreased stride length;Trunk flexed Gait velocity:  Decreased Gait velocity interpretation: Below normal speed for age/gender General Gait Details: Pt was able to take steps bed>chair with RW and min assist for steadying support. Pt appeared to be rushing - states this was due to his breathing.    Stairs            Wheelchair Mobility    Modified Rankin (Stroke Patients Only)       Balance Overall balance assessment: Needs assistance Sitting-balance support: Feet supported;No upper extremity supported Sitting balance-Leahy Scale: Fair     Standing balance support: Bilateral upper extremity supported;During functional activity Standing balance-Leahy Scale: Poor                      Cognition Arousal/Alertness: Awake/alert Behavior During Therapy: WFL for tasks assessed/performed Overall Cognitive Status: Within Functional Limits for tasks assessed                      Exercises General Exercises - Lower Extremity Ankle Circles/Pumps: 10 reps Quad Sets: 10 reps Long Arc Quad: 10 reps Hip ABduction/ADduction: 10 reps    General Comments        Pertinent Vitals/Pain Pain Assessment: No/denies pain    Home Living                      Prior Function            PT Goals (current goals can now be found in the care plan section) Acute Rehab PT Goals Patient Stated Goal: to get back home PT Goal Formulation: With patient Time For Goal Achievement: 02/11/14 Potential to Achieve Goals: Good Progress towards PT goals: Progressing toward  goals    Frequency  Min 3X/week    PT Plan Current plan remains appropriate    Co-evaluation             End of Session Equipment Utilized During Treatment: Gait belt;Oxygen Activity Tolerance: Patient tolerated treatment well;Patient limited by fatigue Patient left: in chair;with call bell/phone within reach     Time: 1424-1448 PT Time Calculation (min) (ACUTE ONLY): 24 min  Charges:  $Gait Training: 8-22 mins $Therapeutic Exercise:  8-22 mins                    G Codes:      Rolinda Roan Feb 02, 2014, 3:07 PM   Rolinda Roan, PT, DPT Acute Rehabilitation Services Pager: [phone]

## 2014-01-29 NOTE — Progress Notes (Signed)
[facility] PROGRESS NOTE   [name] NFA:[id number] DOB: 1932-04-06 DOA: 01/27/2014 PCP: [name], MD  HPI/Subjective: Feels much better, denies any complaints. Breathing improved since admission.  Assessment/Plan: Principal Problem:   Acute diastolic CHF (congestive heart failure) Active Problems:   Type 2 diabetes mellitus with renal manifestations   Obstructive sleep apnea   Essential hypertension   Paroxysmal atrial fibrillation   CHRONIC KIDNEY DISEASE STAGE III (MODERATE)   PROSTATE CANCER, HX OF   Open wound of left lower leg   Anemia of chronic disease   HCAP (healthcare-associated pneumonia)   Acute on chronic respiratory failure with hypoxemia   Second degree AV block, Mobitz type I    Acute on chronic diastolic CHF Patient has been on high dose of Bumex and Zaroxolyn as outpatient, still came in with pulmonary edema. Started on 60 mg of IV Lasix twice a day. Track daily weights, intake and output. Patient has stage IV chronic kidney disease, followed renal function closely. Check BMP in a.m. Patient is not on beta blocker secondary to history of Mobitz type I AV block and not on ACE or ARB because of high creatinine. We'll place him on hydralazine/Imdur for his diastolic CHF. On amlodipine as well.  ?HCAP Chest x-ray shows bilateral infiltrates, unclear whether pulmonary edema alone or with pneumonia. Patient started on vancomycin and cefepime, treated as hospital-acquired pneumonia. No fever, no leukocytosis, procalcitonin is less than 0.1, antibiotic discontinued.  Acute on chronic respiratory failure with hypoxia Patient is on home oxygen, uses CPAP at night. This is likely multifactorial secondary to acute diastolic CHF, better than yesterday.  Diabetes mellitus type 2 with renal manifestation Continue home dose of Levemir insulin, check hemoglobin A1c. Carbohydrate modified diet and SSI.  Paroxysmal atrial fibrillation Stable and  chronic, not on anticoagulation because of history of bleeding, overall poor candidate. Rate is controlled.  Chronic kidney disease stage III Baseline creatinine is around 2-2.3, patient is around baseline. Started on IV Lasix, continue to monitor electrolytes, check BMP in a.m.  Mobitz type I second-degree AV block Chronic issue, continue amiodarone, will try to take off of clonidine.  Open wound of the left lower leg Seen by [facility] team, follow recommendations.  Code Status: Full code Family Communication: Plan discussed with the patient. Disposition Plan: Remains inpatient, transfer to telemetry.   Consultants:  None  Procedures:  None  Antibiotics:  None   Objective: Filed Vitals:   01/29/14 0627  BP: 111/77  Pulse: 69  Temp: 97.8 F (36.6 C)  Resp: 19    Intake/Output Summary (Last 24 hours) at 01/29/14 1133 Last data filed at 01/29/14 0618  Gross per 24 hour  Intake   1090 ml  Output   1200 ml  Net   -110 ml   Filed Weights   01/27/14 2101 01/29/14 0627  Weight: 107.9 kg (237 lb 14 oz) 107.5 kg (236 lb 15.9 oz)    Exam: General: Alert and awake, oriented x3, not in any acute distress. HEENT: anicteric sclera, pupils reactive to light and accommodation, EOMI CVS: S1-S2 clear, no murmur rubs or gallops Chest: clear to auscultation bilaterally, no wheezing, rales or rhonchi Abdomen: soft nontender, nondistended, normal bowel sounds, no organomegaly Extremities: no cyanosis, clubbing or edema noted bilaterally Neuro: Cranial nerves II-XII intact, no focal neurological deficits  Data Reviewed: Basic Metabolic Panel:  Recent Labs Lab 01/27/14 1709 01/27/14 2222 01/28/14 0258 01/29/14 0555  NA 141  --  142 145  K 4.5  --  4.0 4.1  CL 105  --  105 106  CO2 23  --  22 25  GLUCOSE 157*  --  161* 67*  BUN 68*  --  60* 61*  CREATININE 2.24* 2.08* 2.13* 2.08*  CALCIUM 9.0  --  9.1 9.1   Liver Function Tests:  Recent Labs Lab 01/28/14 0258    AST 13  ALT 17  ALKPHOS 96  BILITOT 0.3  PROT 7.4  ALBUMIN 2.9*   No results for input(s): LIPASE, AMYLASE in the last 168 hours. No results for input(s): AMMONIA in the last 168 hours. CBC:  Recent Labs Lab 01/27/14 1709 01/27/14 2222 01/28/14 0258 01/29/14 0555  WBC 7.0 6.8 7.0 6.6  HGB 9.8* 9.9* 9.8* 9.9*  HCT 32.8* 33.2* 33.0* 34.0*  MCV 80.4 80.6 81.1 81.5  PLT 269 279 281 295   Cardiac Enzymes:  Recent Labs Lab 01/27/14 2222 01/28/14 0258  TROPONINI <0.30 <0.30   BNP (last 3 results)  Recent Labs  10/21/13 2012 11/28/13 1215 01/27/14 1709  PROBNP 1541.0* 1597.0* 2020.0*   CBG:  Recent Labs Lab 01/28/14 1141 01/28/14 1720 01/28/14 2217 01/29/14 0751 01/29/14 1008  GLUCAP 160* 112* 129* 84* 127*    Micro Recent Results (from the past 240 hour(s))  MRSA PCR Screening     Status: Abnormal   Collection Time: 01/27/14  9:01 PM  Result Value Ref Range Status   MRSA by PCR POSITIVE (A) NEGATIVE Final    Comment:        The GeneXpert MRSA Assay (FDA approved for NASAL specimens only), is one component of a comprehensive MRSA colonization surveillance program. It is not intended to diagnose MRSA infection nor to guide or monitor treatment for MRSA infections. RESULT CALLED TO, READ BACK BY AND VERIFIED WITH: M.EVANGELISTA,RN AT 0145 BY L.PITT 01/28/14   Culture, blood (routine x 2) Call MD if unable to obtain prior to antibiotics being given     Status: None (Preliminary result)   Collection Time: 01/27/14 10:15 PM  Result Value Ref Range Status   Specimen Description BLOOD RIGHT ARM  Final   Special Requests BOTTLES DRAWN AEROBIC ONLY 5CC  Final   Culture  Setup Time   Final    01/28/2014 03:11 Performed at [facility]    Culture   Final           BLOOD CULTURE RECEIVED NO GROWTH TO DATE CULTURE WILL BE HELD FOR 5 DAYS BEFORE ISSUING A FINAL NEGATIVE REPORT Performed at [facility]    Report Status PENDING   Incomplete  Culture, blood (routine x 2) Call MD if unable to obtain prior to antibiotics being given     Status: None (Preliminary result)   Collection Time: 01/27/14 10:22 PM  Result Value Ref Range Status   Specimen Description BLOOD LEFT ARM  Final   Special Requests BOTTLES DRAWN AEROBIC AND ANAEROBIC 5CC  Final   Culture  Setup Time   Final    01/28/2014 03:11 Performed at [facility]    Culture   Final           BLOOD CULTURE RECEIVED NO GROWTH TO DATE CULTURE WILL BE HELD FOR 5 DAYS BEFORE ISSUING A FINAL NEGATIVE REPORT Performed at [facility]    Report Status PENDING  Incomplete     Studies: Dg Chest 2 View  01/27/2014   CLINICAL DATA:  Shortness of breath.  EXAM: CHEST  2 VIEW  COMPARISON:  11/28/2013 and  10/24/2013 and CT scan dated 10/21/2013  FINDINGS: The patient has interstitial infiltrates, right greater than left superimposed on severe chronic lung disease. There are tiny bilateral pleural effusions. There is peribronchial thickening as well as thickening of the fissures. The finding is most consistent with pulmonary edema superimposed on severe chronic lung disease.  IMPRESSION: Probable pulmonary edema superimposed on severe chronic lung disease.   Electronically Signed   By: Rozetta Nunnery M.D.   On: 01/27/2014 18:45    Scheduled Meds: . allopurinol  100 mg Oral QHS  . amiodarone  100 mg Oral Daily  . amLODipine  10 mg Oral Daily  . aspirin EC  325 mg Oral Daily  . atorvastatin  40 mg Oral Daily  . Chlorhexidine Gluconate Cloth  6 each Topical Q0600  . citalopram  20 mg Oral Daily  . cloNIDine  0.1 mg Transdermal Q Thu  . ferrous [id number] C-folic acid  1 capsule Oral Daily  . furosemide  60 mg Intravenous BID  . heparin  5,000 Units Subcutaneous 3 times per day  . insulin aspart  0-9 Units Subcutaneous TID WC  . insulin detemir  45 Units Subcutaneous BID  . isosorbide mononitrate  60 mg Oral Daily  . mometasone-formoterol  2 puff  Inhalation BID  . mupirocin ointment  1 application Nasal BID  . pantoprazole  40 mg Oral Daily  . potassium chloride SA  20 mEq Oral Daily  . sodium chloride  3 mL Intravenous Q12H   Continuous Infusions:      Time spent: 35 minutes    [facility] A  [facility] Pager [phone] If 7PM-7AM, please contact night-coverage at [email or web address], password [facility] 01/29/2014, 11:33 AM  LOS: 2 days

## 2014-01-29 NOTE — Progress Notes (Signed)
Medicare Important Message given?  YES (If response is "NO", the following Medicare IM given date fields will be blank) Date Medicare IM given:   Medicare IM given by:  Dryden Tapley 

## 2014-01-30 LAB — GLUCOSE, CAPILLARY
Glucose-Capillary: 65 mg/dL — ABNORMAL LOW (ref 70–99)
Glucose-Capillary: 82 mg/dL (ref 70–99)
Glucose-Capillary: 130 mg/dL — ABNORMAL HIGH (ref 70–99)
Glucose-Capillary: 133 mg/dL — ABNORMAL HIGH (ref 70–99)
Glucose-Capillary: 144 mg/dL — ABNORMAL HIGH (ref 70–99)

## 2014-01-30 LAB — BASIC METABOLIC PANEL
Anion gap: 15 (ref 5–15)
BUN: 61 mg/dL — ABNORMAL HIGH (ref 6–23)
CO2: 25 mEq/L (ref 19–32)
CREATININE: 2.06 mg/dL — AB (ref 0.50–1.35)
Calcium: 8.9 mg/dL (ref 8.4–10.5)
Chloride: 103 mEq/L (ref 96–112)
GFR, EST AFRICAN AMERICAN: 34 mL/min — AB (ref >90)
GFR, EST NON AFRICAN AMERICAN: 29 mL/min — AB (ref >90)
Glucose, Bld: 71 mg/dL (ref 70–99)
POTASSIUM: 4.3 meq/L (ref 3.7–5.3)
Sodium: 143 mEq/L (ref 137–147)

## 2014-01-30 MED ORDER — ISOSORBIDE MONONITRATE ER 120 MG PO TB24: 120.0000 mg | ORAL_TABLET | Freq: Every day | ORAL | Status: DC

## 2014-01-30 MED ORDER — INSULIN DETEMIR 100 UNIT/ML FLEXPEN: 40.0000 [IU] | PEN_INJECTOR | Freq: Two times a day (BID) | SUBCUTANEOUS | Status: DC

## 2014-01-30 MED ORDER — METOLAZONE 5 MG PO TABS
5.0000 mg | ORAL_TABLET | Freq: Once | ORAL | Status: AC
  Administered 2014-01-30: 5 mg via ORAL
  Filled 2014-01-30: qty 1

## 2014-01-30 NOTE — Progress Notes (Signed)
Patient declined to wear CPAP tonight.  Patient is aware to have RN call RT if he changes his mind.

## 2014-01-30 NOTE — Discharge Summary (Signed)
Physician Discharge Summary  [name] WFU:[id number] DOB: Nov 02, 1932 DOA: 01/27/2014  PCP: [name], MD  Admit date: 01/27/2014 Discharge date: 01/30/2014  Time spent: 40 minutes  Recommendations for Outpatient Follow-up:  1. Follow-up with primary care physician within one week. 2. Levemir insulin dose decreased to 4 units twice a day  Discharge Diagnoses:  Principal Problem:   Acute diastolic CHF (congestive heart failure) Active Problems:   Type 2 diabetes mellitus with renal manifestations   Obstructive sleep apnea   Essential hypertension   Paroxysmal atrial fibrillation   CHRONIC KIDNEY DISEASE STAGE III (MODERATE)   PROSTATE CANCER, HX OF   Open wound of left lower leg   Anemia of chronic disease   HCAP (healthcare-associated pneumonia)   Acute on chronic respiratory failure with hypoxemia   Second degree AV block, Mobitz type I   Discharge Condition: Stable  Diet recommendation: Heart healthy  Filed Weights   01/27/14 2101 01/29/14 0627 01/30/14 0613  Weight: 107.9 kg (237 lb 14 oz) 107.5 kg (236 lb 15.9 oz) 106.5 kg (234 lb 12.6 oz)    History of present illness:  KERMAN PFOST is a 80 y.o. male with a Past Medical History of chronic diastolic heart failure, chronic respiratory failure on home O2, history of type I second-degree AV block, hypertension, anemia related to myelodysplastic syndrome, diabetes, atrial fibrillation not on anticoagulation (poor anticoagulation candidate) who is bed to wheelchair bound and nonambulatory for the past 3 years who presents today with the above noted complaint. Per patient, for the past 4 days or so he has had worsening of his chronic dyspnea. Patient claims that simple activities like transferring from his bed to his wheelchair causes him to become extremely short of breath. He claims to have chronic orthopnea and sleeps in a recliner and thinks that this is gotten worse as well. He also claims that he has had worsening  of his lower extremity edema. He also has a cough that is productive with white phlegm. He denies any fever. Patient lives alone, has family-particularly his daughter keeps a very close eye on him. A home health care RN had seen him a few days back, A outpatient chest x-ray was done 2 days ago which showed pneumonia, patient was started on levofloxacin, however since he did not improve he was advised by his PCP to come to the emergency room for further evaluation and treatment. Patient denies any fever, headache, chest pain, nausea, vomiting or diarrhea.  Hospital Course:     Acute on chronic diastolic CHF Patient has been on high dose of Bumex and Zaroxolyn as outpatient, still came in with pulmonary edema. Started on 60 mg of IV Lasix twice a day. Patient did very well with shortness of breath improved and lower extremity edema as well. Patient has stage IV chronic kidney disease, followed renal function closely. Patient is not on beta blocker secondary to history of Mobitz type I AV block and not on ACE or ARB because of high creatinine. Patient is on hydralazine, Imdur, clonidine and amlodipine for both CHF and blood pressure. On discharge clonidine discontinued as of history of AV block, Imdur increased from 60 to 120. His home dose of diuretics restarted (patient is on Demadex and Zaroxolyn), instructed to follow strict salt and fluid intake.  ?HCAP Chest x-ray shows bilateral infiltrates, unclear whether pulmonary edema alone or with pneumonia. Patient started on vancomycin and cefepime, treated as hospital-acquired pneumonia. No fever, no leukocytosis, procalcitonin is less than 0.1,  antibiotic discontinued.  Acute on chronic respiratory failure with hypoxia Patient is on home oxygen, uses CPAP at night. This is likely multifactorial secondary to acute diastolic CHF, improved on day of discharge.  Diabetes mellitus type 2 with renal manifestation Hemoglobin A1c of 7.1, this  correlate with mean plasma glucose of 157. Carbohydrate modified diet and SSI were utilized in the hospital. At discharge his home dose of Levemir insulin decreased to 40 units twice a day because of episodes of hypoglycemia.  Paroxysmal atrial fibrillation Stable and chronic, not on anticoagulation because of history of bleeding, overall poor candidate. Not on any AV blocking agents because of Mobitz type I AV block  Chronic kidney disease stage III Baseline creatinine is around 2-2.3, patient is around baseline. Started on IV LasiRestarted back on his home dose of insulin on discharge.  Mobitz type I second-degree AV block Chronic issue, continue amiodarone, will try to take off of clonidine. Taken off of clonidine and Imdur increased to 120  Open wound of the left lower leg Seen by [name] team, follow recommendations.   Procedures:  None  Consultations:  None  Discharge Exam: Filed Vitals:   01/30/14 0613  BP: 156/70  Pulse: 73  Temp: 99.3 F (37.4 C)  Resp: 18   General: Alert and awake, oriented x3, not in any acute distress. HEENT: anicteric sclera, pupils reactive to light and accommodation, EOMI CVS: S1-S2 clear, no murmur rubs or gallops Chest: clear to auscultation bilaterally, no wheezing, rales or rhonchi Abdomen: soft nontender, nondistended, normal bowel sounds, no organomegaly Extremities: no cyanosis, clubbing or edema noted bilaterally Neuro: Cranial nerves II-XII intact, no focal neurological deficits  Discharge Instructions You were cared for by a hospitalist during your hospital stay. If you have any questions about your discharge medications or the care you received while you were in the hospital after you are discharged, you can call the unit and asked to speak with the hospitalist on call if the hospitalist that took care of you is not available. Once you are discharged, your primary care physician will handle any further medical issues. Please note  that NO REFILLS for any discharge medications will be authorized once you are discharged, as it is imperative that you return to your primary care physician (or establish a relationship with a primary care physician if you do not have one) for your aftercare needs so that they can reassess your need for medications and monitor your lab values.  Discharge Instructions    Diet - low sodium heart healthy    Complete by:  As directed      Increase activity slowly    Complete by:  As directed           Current Discharge Medication List    CONTINUE these medications which have CHANGED   Details  Insulin Detemir (LEVEMIR) 100 UNIT/ML Pen Inject 40 Units into the skin 2 (two) times daily.    isosorbide mononitrate (IMDUR) 120 MG 24 hr tablet Take 1 tablet (120 mg total) by mouth daily. Qty: 30 tablet, Refills: 0      CONTINUE these medications which have NOT CHANGED   Details  allopurinol (ZYLOPRIM) 100 MG tablet Take 1 tablet (100 mg total) by mouth at bedtime. Qty: 30 tablet, Refills: 11    amiodarone (PACERONE) 200 MG tablet Take 100 mg by mouth every morning.    amLODipine (NORVASC) 10 MG tablet Take 10 mg by mouth daily.    aspirin EC 325 MG tablet  Take 325 mg by mouth daily.    atorvastatin (LIPITOR) 40 MG tablet TAKE 1 TABLET BY MOUTH EVERY DAY Qty: 30 tablet, Refills: 6    citalopram (CELEXA) 10 MG tablet Take 20 mg by mouth daily.     Fe Fum-Vit C-Vit B12-FA (TRIGELS-F) 460-60-0.01-1 MG CAPS capsule Take 1 capsule by mouth daily. Qty: 30 capsule, Refills: 6    hydrALAZINE (APRESOLINE) 100 MG tablet Take 100 mg by mouth 3 (three) times daily. Refills: 6    levothyroxine (SYNTHROID, LEVOTHROID) 125 MCG tablet Take 125 mcg by mouth every morning. Refills: 11    neomycin-bacitracin-polymyxin (NEOSPORIN) OINT Apply 1 application topically daily.    omeprazole (PRILOSEC) 20 MG capsule Take 20 mg by mouth daily.    potassium chloride SA (K-DUR,KLOR-CON) 20 MEQ tablet Take  20 mEq by mouth daily. Along with metolazone as needed once daily for weight gain    silver sulfADIAZINE (SILVADENE) 1 % cream Apply topically daily. Topical, Daily, For 21 days Apply to Bilateral LE ulcerations twice daily for three days (Wednesday through Friday) then decrease dressing changes to daily and continue for 21 days. Qty: 50 g, Refills: 0    torsemide (DEMADEX) 20 MG tablet Take 3 tablets (60mg ) in am and 2 tablets (40mg ) in pm Qty: 150 tablet, Refills: 3    Fluticasone-Salmeterol (ADVAIR) 100-50 MCG/DOSE AEPB Inhale 1 puff into the lungs as needed (for shortness of breath).    Associated Diagnoses: Chronic kidney disease, stage III (moderate)    metolazone (ZAROXOLYN) 2.5 MG tablet Take 1 tablet (2.5 mg total) by mouth daily as needed (for weight gain, take 1 potassium tablet with this as well). Qty: 10 tablet, Refills: 3      STOP taking these medications     cloNIDine (CATAPRES - DOSED IN MG/24 HR) 0.1 mg/24hr patch      levofloxacin (LEVAQUIN) 250 MG tablet        Allergies  Allergen Reactions  . Ace Inhibitors Other (See Comments)    HYPOTENSION  . Beta Adrenergic Blockers Other (See Comments)     use cautiously secondary to 2nd degree heart block, hypotension      The results of significant diagnostics from this hospitalization (including imaging, microbiology, ancillary and laboratory) are listed below for reference.    Significant Diagnostic Studies: Dg Chest 2 View  01/27/2014   CLINICAL DATA:  Shortness of breath.  EXAM: CHEST  2 VIEW  COMPARISON:  11/28/2013 and 10/24/2013 and CT scan dated 10/21/2013  FINDINGS: The patient has interstitial infiltrates, right greater than left superimposed on severe chronic lung disease. There are tiny bilateral pleural effusions. There is peribronchial thickening as well as thickening of the fissures. The finding is most consistent with pulmonary edema superimposed on severe chronic lung disease.  IMPRESSION: Probable  pulmonary edema superimposed on severe chronic lung disease.   Electronically Signed   By: Rozetta Nunnery M.D.   On: 01/27/2014 18:45    Microbiology: Recent Results (from the past 240 hour(s))  MRSA PCR Screening     Status: Abnormal   Collection Time: 01/27/14  9:01 PM  Result Value Ref Range Status   MRSA by PCR POSITIVE (A) NEGATIVE Final    Comment:        The GeneXpert MRSA Assay (FDA approved for NASAL specimens only), is one component of a comprehensive MRSA colonization surveillance program. It is not intended to diagnose MRSA infection nor to guide or monitor treatment for MRSA infections. RESULT CALLED TO, READ BACK  BY AND VERIFIED WITH: M.EVANGELISTA,RN AT 0145 BY L.PITT 01/28/14   Culture, blood (routine x 2) Call MD if unable to obtain prior to antibiotics being given     Status: None (Preliminary result)   Collection Time: 01/27/14 10:15 PM  Result Value Ref Range Status   Specimen Description BLOOD RIGHT ARM  Final   Special Requests BOTTLES DRAWN AEROBIC ONLY 5CC  Final   Culture  Setup Time   Final    01/28/2014 03:11 Performed at [facility]    Culture   Final           BLOOD CULTURE RECEIVED NO GROWTH TO DATE CULTURE WILL BE HELD FOR 5 DAYS BEFORE ISSUING A FINAL NEGATIVE REPORT Performed at [facility]    Report Status PENDING  Incomplete  Culture, blood (routine x 2) Call MD if unable to obtain prior to antibiotics being given     Status: None (Preliminary result)   Collection Time: 01/27/14 10:22 PM  Result Value Ref Range Status   Specimen Description BLOOD LEFT ARM  Final   Special Requests BOTTLES DRAWN AEROBIC AND ANAEROBIC 5CC  Final   Culture  Setup Time   Final    01/28/2014 03:11 Performed at [facility]    Culture   Final           BLOOD CULTURE RECEIVED NO GROWTH TO DATE CULTURE WILL BE HELD FOR 5 DAYS BEFORE ISSUING A FINAL NEGATIVE REPORT Performed at [facility]    Report Status PENDING  Incomplete      Labs: Basic Metabolic Panel:  Recent Labs Lab 01/27/14 1709 01/27/14 2222 01/28/14 0258 01/29/14 0555 01/30/14 0513  NA 141  --  142 145 143  K 4.5  --  4.0 4.1 4.3  CL 105  --  105 106 103  CO2 23  --  22 25 25   GLUCOSE 157*  --  161* 67* 71  BUN 68*  --  60* 61* 61*  CREATININE 2.24* 2.08* 2.13* 2.08* 2.06*  CALCIUM 9.0  --  9.1 9.1 8.9   Liver Function Tests:  Recent Labs Lab 01/28/14 0258  AST 13  ALT 17  ALKPHOS 96  BILITOT 0.3  PROT 7.4  ALBUMIN 2.9*   No results for input(s): LIPASE, AMYLASE in the last 168 hours. No results for input(s): AMMONIA in the last 168 hours. CBC:  Recent Labs Lab 01/27/14 1709 01/27/14 2222 01/28/14 0258 01/29/14 0555  WBC 7.0 6.8 7.0 6.6  HGB 9.8* 9.9* 9.8* 9.9*  HCT 32.8* 33.2* 33.0* 34.0*  MCV 80.4 80.6 81.1 81.5  PLT 269 279 281 295   Cardiac Enzymes:  Recent Labs Lab 01/27/14 2222 01/28/14 0258  TROPONINI <0.30 <0.30   BNP: BNP (last 3 results)  Recent Labs  10/21/13 2012 11/28/13 1215 01/27/14 1709  PROBNP 1541.0* 1597.0* 2020.0*   CBG:  Recent Labs Lab 01/29/14 1145 01/29/14 1704 01/29/14 2128 01/30/14 0811 01/30/14 0837  GLUCAP 129* 159* 131* 65* 82       Signed:  Olla Delancey A  [facility] 01/30/2014, 9:44 AM

## 2014-01-31 LAB — BASIC METABOLIC PANEL
Anion gap: 15 (ref 5–15)
BUN: 65 mg/dL — AB (ref 6–23)
CO2: 24 mEq/L (ref 19–32)
Calcium: 8.9 mg/dL (ref 8.4–10.5)
Chloride: 98 mEq/L (ref 96–112)
Creatinine, Ser: 2.42 mg/dL — ABNORMAL HIGH (ref 0.50–1.35)
GFR calc Af Amer: 28 mL/min — ABNORMAL LOW (ref >90)
GFR, EST NON AFRICAN AMERICAN: 24 mL/min — AB (ref >90)
Glucose, Bld: 117 mg/dL — ABNORMAL HIGH (ref 70–99)
Potassium: 4.2 mEq/L (ref 3.7–5.3)
Sodium: 137 mEq/L (ref 137–147)

## 2014-01-31 LAB — GLUCOSE, CAPILLARY
Glucose-Capillary: 75 mg/dL (ref 70–99)
Glucose-Capillary: 146 mg/dL — ABNORMAL HIGH (ref 70–99)

## 2014-01-31 NOTE — Progress Notes (Signed)
SATURATION QUALIFICATIONS: (This note is used to comply with regulatory documentation for home oxygen)  Patient Saturations on Room Air at Rest = 94%  Patient Saturations on Room Air while Ambulating = 85%  Patient Saturations on 2 Liters of oxygen while Ambulating = 95%  Please briefly explain why patient needs home oxygen: Pt. desats during ambulation or upon exertion.

## 2014-01-31 NOTE — Care Management Note (Signed)
    Page 1 of 1   01/31/2014     8:30:42 AM CARE MANAGEMENT NOTE 01/31/2014  Patient:  [name], [name]   Account Number:  [id number]  Date Initiated:  01/28/2014  Documentation initiated by:  MAYO,HENRIETTA  Subjective/Objective Assessment:   dx PNA/diastolic failure    PCP  [name]     Action/Plan:   Anticipated DC Date:  01/30/2014   Anticipated DC Plan:  [place]  CM consult      [facility] Choice  Resumption Of Svcs/PTA Provider   Choice offered to / List presented to:          [facility] arranged  HH-1 RN  [place].   Status of service:  Completed, signed off Medicare Important Message given?  YES (If response is "NO", the following Medicare IM given date fields will be blank) Date Medicare IM given:  01/31/2014 Medicare IM given by:  [facility] Date Additional Medicare IM given:   Additional Medicare IM given by:    Discharge Disposition:  [place]  Per UR Regulation:  Reviewed for med. necessity/level of care/duration of stay  If discussed at [place] of Stay Meetings, dates discussed:    Comments:  01/31/14 08:25 CM notes orders have been placed. Notification called to [facility] rep, Stephanie.  No other CM needs were communicated.  Mariane Masters, BSN, [place] [phone].  01/28/14 1000 Babette Relic RN MSN BSN CCM Pt is active with [facility] for RN and ST and they will resume services when discharged.

## 2014-01-31 NOTE — Progress Notes (Signed)
Patient seen and examined, no changes clinically since yesterday. Discharge summary redone. Patient now requires oxygen continuously, he was off of oxygen for 4-5 hours without being short of breath. His routine vital checks showed oxygen saturation in the 80s so oxygen was placed yesterday back again on him. Patient feels great for discharge today.  Birdie Hopes Pager: [phone] 01/31/2014, 9:24 AM

## 2014-01-31 NOTE — Progress Notes (Signed)
Pt. Received discharge instructions and prescriptions. Went over discharge instructions with pt. Daughter and family at bedside. Educated pt. On follow-up appointments and demonstrated how to use home glucometer. Pt. And family were able to successfully re-demonstrate Removed IV. Changed bilateral leg dressings. All questions answered. No further needs noted at this time.

## 2014-01-31 NOTE — Discharge Summary (Signed)
Physician Discharge Summary  [name] EXB:[id number] DOB: 1932-12-11 DOA: 01/27/2014  PCP: [name], MD  Admit date: 01/27/2014 Discharge date: 01/31/2014  Time spent: 40 minutes  Recommendations for Outpatient Follow-up:  1. Follow-up with primary care physician within one week. 2. Levemir insulin dose decreased to 40 units twice a day. 3. Needs 1-2 L per oxygen continuously to keep saturation above 89% (used to use it only at night)  Discharge Diagnoses:  Principal Problem:   Acute diastolic CHF (congestive heart failure) Active Problems:   Type 2 diabetes mellitus with renal manifestations   Obstructive sleep apnea   Essential hypertension   Paroxysmal atrial fibrillation   CHRONIC KIDNEY DISEASE STAGE III (MODERATE)   PROSTATE CANCER, HX OF   Open wound of left lower leg   Anemia of chronic disease   HCAP (healthcare-associated pneumonia)   Acute on chronic respiratory failure with hypoxemia   Second degree AV block, Mobitz type I   Discharge Condition: Stable  Diet recommendation: Heart healthy  Filed Weights   01/29/14 0627 01/30/14 0613 01/31/14 0550  Weight: 107.5 kg (236 lb 15.9 oz) 106.5 kg (234 lb 12.6 oz) 108.2 kg (238 lb 8.6 oz)    History of present illness:  [name] is a 80 y.o. male with a Past Medical History of chronic diastolic heart failure, chronic respiratory failure on home O2, history of type I second-degree AV block, hypertension, anemia related to myelodysplastic syndrome, diabetes, atrial fibrillation not on anticoagulation (poor anticoagulation candidate) who is bed to wheelchair bound and nonambulatory for the past 3 years who presents today with the above noted complaint. Per patient, for the past 4 days or so he has had worsening of his chronic dyspnea. Patient claims that simple activities like transferring from his bed to his wheelchair causes him to become extremely short of breath. He claims to have chronic orthopnea and  sleeps in a recliner and thinks that this is gotten worse as well. He also claims that he has had worsening of his lower extremity edema. He also has a cough that is productive with white phlegm. He denies any fever. Patient lives alone, has family-particularly his daughter keeps a very close eye on him. A home health care RN had seen him a few days back, A outpatient chest x-ray was done 2 days ago which showed pneumonia, patient was started on levofloxacin, however since he did not improve he was advised by his PCP to come to the emergency room for further evaluation and treatment. Patient denies any fever, headache, chest pain, nausea, vomiting or diarrhea.  Hospital Course:     Acute on chronic diastolic CHF Patient has been on high dose of Bumex and Zaroxolyn as outpatient, still came in with pulmonary edema. Started on 60 mg of IV Lasix twice a day. Patient did very well with shortness of breath improved and lower extremity edema as well. Patient has stage IV chronic kidney disease, followed renal function closely. Patient is not on beta blocker secondary to history of Mobitz type I AV block and not on ACE or ARB because of high creatinine. Patient is on hydralazine, Imdur, clonidine and amlodipine for both CHF and blood pressure. On discharge clonidine discontinued as of history of AV block, Imdur increased from 60 to 120. His home dose of diuretics restarted (patient is on Demadex and Zaroxolyn), instructed to follow strict salt and fluid intake.  ?HCAP Chest x-ray shows bilateral infiltrates, unclear whether pulmonary edema alone or with pneumonia.  Patient started on vancomycin and cefepime, treated as hospital-acquired pneumonia. No fever, no leukocytosis, procalcitonin is less than 0.1, antibiotic discontinued.  Acute on chronic respiratory failure with hypoxia Patient is on home oxygen, at night only uses CPAP at night. This is likely multifactorial secondary to acute diastolic  CHF, improved on day of discharge. It appears now he requires 2 L of oxygen continuously.  Diabetes mellitus type 2 with renal manifestation Hemoglobin A1c of 7.1, this correlate with mean plasma glucose of 157. Carbohydrate modified diet and SSI were utilized in the hospital. At discharge his home dose of Levemir insulin decreased to 40 units twice a day because of episodes of hypoglycemia.  Paroxysmal atrial fibrillation Stable and chronic, not on anticoagulation because of history of bleeding, overall poor candidate. Not on any AV blocking agents because of Mobitz type I AV block  Chronic kidney disease stage III Baseline creatinine is around 2-2.3, patient is around baseline. Started on IV LasiRestarted back on his home dose of insulin on discharge.  Mobitz type I second-degree AV block Chronic issue, continue amiodarone, will try to take off of clonidine. Taken off of clonidine and Imdur increased to 120  Open wound of the left lower leg Seen by [facility] team, follow recommendations.   Procedures:  None  Consultations:  None  Discharge Exam: Filed Vitals:   01/31/14 0610  BP: 134/64  Pulse: 69  Temp: 99 F (37.2 C)  Resp: 18   General: Alert and awake, oriented x3, not in any acute distress. HEENT: anicteric sclera, pupils reactive to light and accommodation, EOMI CVS: S1-S2 clear, no murmur rubs or gallops Chest: clear to auscultation bilaterally, no wheezing, rales or rhonchi Abdomen: soft nontender, nondistended, normal bowel sounds, no organomegaly Extremities: no cyanosis, clubbing or edema noted bilaterally Neuro: Cranial nerves II-XII intact, no focal neurological deficits  Discharge Instructions You were cared for by a hospitalist during your hospital stay. If you have any questions about your discharge medications or the care you received while you were in the hospital after you are discharged, you can call the unit and asked to speak with the hospitalist on  call if the hospitalist that took care of you is not available. Once you are discharged, your primary care physician will handle any further medical issues. Please note that NO REFILLS for any discharge medications will be authorized once you are discharged, as it is imperative that you return to your primary care physician (or establish a relationship with a primary care physician if you do not have one) for your aftercare needs so that they can reassess your need for medications and monitor your lab values.  Discharge Instructions    Diet - low sodium heart healthy    Complete by:  As directed      Increase activity slowly    Complete by:  As directed           Current Discharge Medication List    CONTINUE these medications which have CHANGED   Details  Insulin Detemir (LEVEMIR) 100 UNIT/ML Pen Inject 40 Units into the skin 2 (two) times daily.    isosorbide mononitrate (IMDUR) 120 MG 24 hr tablet Take 1 tablet (120 mg total) by mouth daily. Qty: 30 tablet, Refills: 0      CONTINUE these medications which have NOT CHANGED   Details  allopurinol (ZYLOPRIM) 100 MG tablet Take 1 tablet (100 mg total) by mouth at bedtime. Qty: 30 tablet, Refills: 11    amiodarone (PACERONE)  200 MG tablet Take 100 mg by mouth every morning.    amLODipine (NORVASC) 10 MG tablet Take 10 mg by mouth daily.    aspirin EC 325 MG tablet Take 325 mg by mouth daily.    atorvastatin (LIPITOR) 40 MG tablet TAKE 1 TABLET BY MOUTH EVERY DAY Qty: 30 tablet, Refills: 6    citalopram (CELEXA) 10 MG tablet Take 20 mg by mouth daily.     Fe Fum-Vit C-Vit B12-FA (TRIGELS-F) 460-60-0.01-1 MG CAPS capsule Take 1 capsule by mouth daily. Qty: 30 capsule, Refills: 6    hydrALAZINE (APRESOLINE) 100 MG tablet Take 100 mg by mouth 3 (three) times daily. Refills: 6    levothyroxine (SYNTHROID, LEVOTHROID) 125 MCG tablet Take 125 mcg by mouth every morning. Refills: 11    neomycin-bacitracin-polymyxin (NEOSPORIN) OINT  Apply 1 application topically daily.    omeprazole (PRILOSEC) 20 MG capsule Take 20 mg by mouth daily.    potassium chloride SA (K-DUR,KLOR-CON) 20 MEQ tablet Take 20 mEq by mouth daily. Along with metolazone as needed once daily for weight gain    silver sulfADIAZINE (SILVADENE) 1 % cream Apply topically daily. Topical, Daily, For 21 days Apply to Bilateral LE ulcerations twice daily for three days (Wednesday through Friday) then decrease dressing changes to daily and continue for 21 days. Qty: 50 g, Refills: 0    torsemide (DEMADEX) 20 MG tablet Take 3 tablets (60mg ) in am and 2 tablets (40mg ) in pm Qty: 150 tablet, Refills: 3    Fluticasone-Salmeterol (ADVAIR) 100-50 MCG/DOSE AEPB Inhale 1 puff into the lungs as needed (for shortness of breath).    Associated Diagnoses: Chronic kidney disease, stage III (moderate)    metolazone (ZAROXOLYN) 2.5 MG tablet Take 1 tablet (2.5 mg total) by mouth daily as needed (for weight gain, take 1 potassium tablet with this as well). Qty: 10 tablet, Refills: 3      STOP taking these medications     cloNIDine (CATAPRES - DOSED IN MG/24 HR) 0.1 mg/24hr patch      levofloxacin (LEVAQUIN) 250 MG tablet        Allergies  Allergen Reactions  . Ace Inhibitors Other (See Comments)    HYPOTENSION  . Beta Adrenergic Blockers Other (See Comments)     use cautiously secondary to 2nd degree heart block, hypotension   Follow-up Information    Follow up with [facility].   Why:  home health speech therapy and nurse   Contact information:   [address] [place] [address] [phone]        The results of significant diagnostics from this hospitalization (including imaging, microbiology, ancillary and laboratory) are listed below for reference.    Significant Diagnostic Studies: Dg Chest 2 View  01/27/2014   CLINICAL DATA:  Shortness of breath.  EXAM: CHEST  2 VIEW  COMPARISON:  11/28/2013 and 10/24/2013 and CT scan  dated 10/21/2013  FINDINGS: The patient has interstitial infiltrates, right greater than left superimposed on severe chronic lung disease. There are tiny bilateral pleural effusions. There is peribronchial thickening as well as thickening of the fissures. The finding is most consistent with pulmonary edema superimposed on severe chronic lung disease.  IMPRESSION: Probable pulmonary edema superimposed on severe chronic lung disease.   Electronically Signed   By: Rozetta Nunnery M.D.   On: 01/27/2014 18:45    Microbiology: Recent Results (from the past 240 hour(s))  MRSA PCR Screening     Status: Abnormal   Collection Time:  01/27/14  9:01 PM  Result Value Ref Range Status   MRSA by PCR POSITIVE (A) NEGATIVE Final    Comment:        The GeneXpert MRSA Assay (FDA approved for NASAL specimens only), is one component of a comprehensive MRSA colonization surveillance program. It is not intended to diagnose MRSA infection nor to guide or monitor treatment for MRSA infections. RESULT CALLED TO, READ BACK BY AND VERIFIED WITH: M.EVANGELISTA,RN AT 0145 BY L.PITT 01/28/14   Culture, blood (routine x 2) Call MD if unable to obtain prior to antibiotics being given     Status: None (Preliminary result)   Collection Time: 01/27/14 10:15 PM  Result Value Ref Range Status   Specimen Description BLOOD RIGHT ARM  Final   Special Requests BOTTLES DRAWN AEROBIC ONLY 5CC  Final   Culture  Setup Time   Final    01/28/2014 03:11 Performed at [facility]    Culture   Final           BLOOD CULTURE RECEIVED NO GROWTH TO DATE CULTURE WILL BE HELD FOR 5 DAYS BEFORE ISSUING A FINAL NEGATIVE REPORT Performed at [facility]    Report Status PENDING  Incomplete  Culture, blood (routine x 2) Call MD if unable to obtain prior to antibiotics being given     Status: None (Preliminary result)   Collection Time: 01/27/14 10:22 PM  Result Value Ref Range Status   Specimen Description BLOOD LEFT ARM   Final   Special Requests BOTTLES DRAWN AEROBIC AND ANAEROBIC 5CC  Final   Culture  Setup Time   Final    01/28/2014 03:11 Performed at [facility]    Culture   Final           BLOOD CULTURE RECEIVED NO GROWTH TO DATE CULTURE WILL BE HELD FOR 5 DAYS BEFORE ISSUING A FINAL NEGATIVE REPORT Performed at [facility]    Report Status PENDING  Incomplete     Labs: Basic Metabolic Panel:  Recent Labs Lab 01/27/14 1709 01/27/14 2222 01/28/14 0258 01/29/14 0555 01/30/14 0513 01/31/14 0447  NA 141  --  142 145 143 137  K 4.5  --  4.0 4.1 4.3 4.2  CL 105  --  105 106 103 98  CO2 23  --  22 25 25 24   GLUCOSE 157*  --  161* 67* 71 117*  BUN 68*  --  60* 61* 61* 65*  CREATININE 2.24* 2.08* 2.13* 2.08* 2.06* 2.42*  CALCIUM 9.0  --  9.1 9.1 8.9 8.9   Liver Function Tests:  Recent Labs Lab 01/28/14 0258  AST 13  ALT 17  ALKPHOS 96  BILITOT 0.3  PROT 7.4  ALBUMIN 2.9*   No results for input(s): LIPASE, AMYLASE in the last 168 hours. No results for input(s): AMMONIA in the last 168 hours. CBC:  Recent Labs Lab 01/27/14 1709 01/27/14 2222 01/28/14 0258 01/29/14 0555  WBC 7.0 6.8 7.0 6.6  HGB 9.8* 9.9* 9.8* 9.9*  HCT 32.8* 33.2* 33.0* 34.0*  MCV 80.4 80.6 81.1 81.5  PLT 269 279 281 295   Cardiac Enzymes:  Recent Labs Lab 01/27/14 2222 01/28/14 0258  TROPONINI <0.30 <0.30   BNP: BNP (last 3 results)  Recent Labs  10/21/13 2012 11/28/13 1215 01/27/14 1709  PROBNP 1541.0* 1597.0* 2020.0*   CBG:  Recent Labs Lab 01/30/14 0837 01/30/14 1201 01/30/14 1805 01/30/14 2235 01/31/14 0851  GLUCAP 82 130* 133* 144* 75  Signed:  Janan Bogie A  [facility] 01/31/2014, 9:21 AM

## 2014-02-01 ENCOUNTER — Telehealth: Payer: Self-pay | Admitting: *Deleted

## 2014-02-01 NOTE — Telephone Encounter (Signed)
Called daughter([name]) to get father set-up for hosp f/u. Since she was not able to check her calendar she will give me a call abck  tomorrow to set-up appt...[name]

## 2014-02-01 NOTE — Telephone Encounter (Signed)
[name] called.  She is patient's daughter, emergency contact and P.O.A.  Asked if she needs to bring Mr. Nick in and he is positive for MRSA.  Was discharged yesterday evening and told to be careful with good handwashing as he is positive for MRSA.  [name] is "not aware of the source just all patients are tested upon admission is all she was told."   Will check policy and determine what contact isolation steps to take in the outpatient setting.  Called [name] bsck and learned dad has swelling t o legs that do open and drain.  [facility] nurses dress every other day.

## 2014-02-02 NOTE — Telephone Encounter (Signed)
Called daughter no answer LMOM RTC../lmb 

## 2014-02-02 NOTE — Telephone Encounter (Signed)
Donita from [facility] called in and said that she needed Verbal orders to continue wound care on legs with Vaseline galls    Best number  [phone]

## 2014-02-02 NOTE — Telephone Encounter (Signed)
Per Specialty Manager Ms Morris staff needs to follow Contact Precautions.  No mask needed to be worn by him.  Called [name] with instructions to enter, use hand sanitizer and register him as normal.

## 2014-02-03 ENCOUNTER — Other Ambulatory Visit (HOSPITAL_BASED_OUTPATIENT_CLINIC_OR_DEPARTMENT_OTHER): Payer: Medicare PPO

## 2014-02-03 ENCOUNTER — Telehealth: Payer: Self-pay | Admitting: Oncology

## 2014-02-03 ENCOUNTER — Ambulatory Visit (HOSPITAL_BASED_OUTPATIENT_CLINIC_OR_DEPARTMENT_OTHER): Payer: Medicare PPO | Admitting: Oncology

## 2014-02-03 DIAGNOSIS — N183 Chronic kidney disease, stage 3 unspecified: Secondary | ICD-10-CM

## 2014-02-03 DIAGNOSIS — I509 Heart failure, unspecified: Secondary | ICD-10-CM

## 2014-02-03 DIAGNOSIS — N039 Chronic nephritic syndrome with unspecified morphologic changes: Secondary | ICD-10-CM

## 2014-02-03 DIAGNOSIS — N189 Chronic kidney disease, unspecified: Secondary | ICD-10-CM

## 2014-02-03 DIAGNOSIS — D509 Iron deficiency anemia, unspecified: Secondary | ICD-10-CM

## 2014-02-03 DIAGNOSIS — D631 Anemia in chronic kidney disease: Secondary | ICD-10-CM

## 2014-02-03 DIAGNOSIS — Z8546 Personal history of malignant neoplasm of prostate: Secondary | ICD-10-CM

## 2014-02-03 LAB — CBC WITH DIFFERENTIAL/PLATELET
BASO%: 0.3 % (ref 0.0–2.0)
Basophils Absolute: 0 10*3/uL (ref 0.0–0.1)
EOS%: 6.2 % (ref 0.0–7.0)
Eosinophils Absolute: 0.4 10*3/uL (ref 0.0–0.5)
HEMATOCRIT: 35.5 % — AB (ref 38.4–49.9)
HGB: 10.7 g/dL — ABNORMAL LOW (ref 13.0–17.1)
LYMPH%: 8.6 % — AB (ref 14.0–49.0)
MCH: 24.2 pg — ABNORMAL LOW (ref 27.2–33.4)
MCHC: 30.2 g/dL — ABNORMAL LOW (ref 32.0–36.0)
MCV: 80.2 fL (ref 79.3–98.0)
MONO#: 1 10*3/uL — ABNORMAL HIGH (ref 0.1–0.9)
MONO%: 16.8 % — AB (ref 0.0–14.0)
NEUT%: 68.1 % (ref 39.0–75.0)
NEUTROS ABS: 4.2 10*3/uL (ref 1.5–6.5)
PLATELETS: 300 10*3/uL (ref 140–400)
RBC: 4.43 10*6/uL (ref 4.20–5.82)
RDW: 23.4 % — ABNORMAL HIGH (ref 11.0–14.6)
WBC: 6.2 10*3/uL (ref 4.0–10.3)
lymph#: 0.5 10*3/uL — ABNORMAL LOW (ref 0.9–3.3)

## 2014-02-03 LAB — CULTURE, BLOOD (ROUTINE X 2)
Culture: NO GROWTH
Culture: NO GROWTH

## 2014-02-03 LAB — HOLD TUBE, BLOOD BANK

## 2014-02-03 MED ORDER — DARBEPOETIN ALFA 300 MCG/0.6ML IJ SOSY
300.0000 ug | PREFILLED_SYRINGE | Freq: Once | INTRAMUSCULAR | Status: AC
  Administered 2014-02-03: 300 ug via SUBCUTANEOUS
  Filled 2014-02-03: qty 0.6

## 2014-02-03 NOTE — Telephone Encounter (Signed)
Notified Donita with md response...[name]

## 2014-02-03 NOTE — Progress Notes (Signed)
Hematology and Oncology Follow Up Visit  [name] [id number] 02-Nov-1932 80 y.o. 02/03/2014 12:23 PM [name], MDJohn, Hunt Oris, MD   Principle Diagnosis: 80 year old gentleman with multifactorial anemia likely related to myelodysplastic syndrome. He also has an element of anemia of chronic disease as well as iron deficiency anemia. This was diagnosed in March of 2015. He also has questionable sclerotic bone lesions that were biopsied without any specific diagnosis made. He has a remote history of prostate cancer.  Current therapy: Supportive transfusions  as needed as well Aranesp 300 mcg every 3 weeks.  Interim History:  [name] presents today for a followup visit with his daughter. Since his last visit, he was hospitalized between 01/27/2014 and 01/31/2014 for congestive heart failure. Since his discharge she has been doing a lot better. He is not reporting any chest pain or shortness of breath. He is able to ambulate with the help of a walker but for the most part his wheelchair bound. He still attends activities at the [facility] on a regular basis. He has not had any falls or illnesses. He has not reported any complications from Aranesp . He has not required transfusions for at least the last 3 months. He is not reporting any difficulty breathing. He is not reporting any back pain shoulder pain or any bone pain at this time. He had not reported any hospitalizations or illnesses. Has not reported any headaches or blurry vision or double vision. Has not reported any nausea or vomiting or abdominal pain. Has not reported any frequency urgency or hesitancy. Rest or view of system is unremarkable.  Medications: I have reviewed the patient's current medications.  Current Outpatient Prescriptions  Medication Sig Dispense Refill  . allopurinol (ZYLOPRIM) 100 MG tablet Take 1 tablet (100 mg total) by mouth at bedtime. 30 tablet 11  . amiodarone (PACERONE) 200 MG tablet Take 100 mg by mouth  every morning.    Marland Kitchen amLODipine (NORVASC) 10 MG tablet Take 10 mg by mouth daily.    Marland Kitchen aspirin EC 325 MG tablet Take 325 mg by mouth daily.    Marland Kitchen atorvastatin (LIPITOR) 40 MG tablet TAKE 1 TABLET BY MOUTH EVERY DAY 30 tablet 6  . citalopram (CELEXA) 10 MG tablet Take 20 mg by mouth daily.     . Fe Fum-Vit C-Vit B12-FA (TRIGELS-F) 460-60-0.01-1 MG CAPS capsule Take 1 capsule by mouth daily. 30 capsule 6  . Fluticasone-Salmeterol (ADVAIR) 100-50 MCG/DOSE AEPB Inhale 1 puff into the lungs as needed (for shortness of breath).     . hydrALAZINE (APRESOLINE) 100 MG tablet Take 100 mg by mouth 3 (three) times daily.  6  . Insulin Detemir (LEVEMIR) 100 UNIT/ML Pen Inject 40 Units into the skin 2 (two) times daily.    . isosorbide mononitrate (IMDUR) 120 MG 24 hr tablet Take 1 tablet (120 mg total) by mouth daily. 30 tablet 0  . levothyroxine (SYNTHROID, LEVOTHROID) 125 MCG tablet Take 125 mcg by mouth every morning.  11  . metolazone (ZAROXOLYN) 2.5 MG tablet Take 1 tablet (2.5 mg total) by mouth daily as needed (for weight gain, take 1 potassium tablet with this as well). 10 tablet 3  . neomycin-bacitracin-polymyxin (NEOSPORIN) OINT Apply 1 application topically daily.    Marland Kitchen omeprazole (PRILOSEC) 20 MG capsule Take 20 mg by mouth daily.    . potassium chloride SA (K-DUR,KLOR-CON) 20 MEQ tablet Take 20 mEq by mouth daily. Along with metolazone as needed once daily for weight gain    .  silver sulfADIAZINE (SILVADENE) 1 % cream Apply topically daily. Topical, Daily, For 21 days Apply to Bilateral LE ulcerations twice daily for three days (Wednesday through Friday) then decrease dressing changes to daily and continue for 21 days. 50 g 0  . torsemide (DEMADEX) 20 MG tablet Take 3 tablets (36m) in am and 2 tablets (434m in pm (Patient taking differently: Take 40-60 mg by mouth 2 (two) times daily. Take 3 tablets (6029min am and 2 tablets (49m61mn pm) 150 tablet 3   Current Facility-Administered Medications   Medication Dose Route Frequency Provider Last Rate Last Dose  . Darbepoetin Alfa (ARANESP) injection 300 mcg  300 mcg Subcutaneous Once FiraWyatt Portela         Allergies:  Allergies  Allergen Reactions  . Ace Inhibitors Other (See Comments)    HYPOTENSION  . Beta Adrenergic Blockers Other (See Comments)     use cautiously secondary to 2nd degree heart block, hypotension    Past Medical History, Surgical history, Social history, and Family History were reviewed and updated.   Physical Exam: Blood pressure 132/61, pulse 79, temperature 99.4 F (37.4 C), temperature source Oral, resp. rate 18, height _0  (1.778 m), weight 229 lb 9.6 oz (104.146 kg). ECOG: 2 General appearance: alert and cooperative not in any distress. Head: Normocephalic, without obvious abnormality Neck: no adenopathy Lymph nodes: Cervical, supraclavicular, and axillary nodes normal. Heart:regular rate and rhythm, S1, S2 normal, no murmur, click, rub or gallop Lung:chest clear, no wheezing, rales, normal symmetric air entry Abdomin: soft, non-tender, without masses or organomegaly EXT: 1+ edema noted.   Lab Results: Lab Results  Component Value Date   WBC 6.2 02/03/2014   HGB 10.7* 02/03/2014   HCT 35.5* 02/03/2014   MCV 80.2 02/03/2014   PLT 300 02/03/2014     Chemistry      Component Value Date/Time   NA 137 01/31/2014 0447   NA 142 10/21/2013 1034   K 4.2 01/31/2014 0447   K 4.1 10/21/2013 1034   CL 98 01/31/2014 0447   CO2 24 01/31/2014 0447   CO2 22 10/21/2013 1034   BUN 65* 01/31/2014 0447   BUN 45.8* 10/21/2013 1034   CREATININE 2.42* 01/31/2014 0447   CREATININE 2.3* 10/21/2013 1034   CREATININE 2.67* 04/22/2013 1801      Component Value Date/Time   CALCIUM 8.9 01/31/2014 0447   CALCIUM 8.7 10/21/2013 1034   ALKPHOS 96 01/28/2014 0258   ALKPHOS 73 10/21/2013 1034   AST 13 01/28/2014 0258   AST 16 10/21/2013 1034   ALT 17 01/28/2014 0258   ALT 17 10/21/2013 1034   BILITOT  0.3 01/28/2014 0258   BILITOT <0.20 10/21/2013 1034       Impression and Plan:  81 y22r old gentleman with the following issues:   1. Multifactorial anemia: He has an element of anemia of renal disease as well as iron deficiency anemia. His bone marrow biopsy completed on 09/09/2013 suggesting myelodysplastic syndrome. He is currently getting Aranesp every 2 weeks and have not needed any transfusions recently.  I plan on increasing the frequency of his Aranesp to every 3 weeks. We will continue to transfuse as needed although I do not anticipate him needing one recently.   2. History of prostate cancer: He is status post prostatectomy close to 15 years ago.Now he has questionable sclerotic lesions with a PSA of around 1.89 and that was repeated and it was down to 1.09. It is unclear to me whether his  bone lesions are indeed metastatic prostate cancer as his PSA continue to be very low and these lesions are not very obvious on the bone scan. We'll continue to monitor this for the time being.  3. Congestive heart failure: He appears to be compensated at this time. Improving his hemoglobin will certainly help with his compensated heart failure.   4. Followup: He will follow-up every 3 weeks for an injection and a clinical visit in February 2016.      [facility], MD 12/16/201512:23 PM

## 2014-02-03 NOTE — Telephone Encounter (Signed)
Gave avs & cal for Jan/Feb. °

## 2014-02-03 NOTE — Telephone Encounter (Signed)
Ok for verbal 

## 2014-02-09 ENCOUNTER — Encounter: Payer: Self-pay | Admitting: Internal Medicine

## 2014-02-09 ENCOUNTER — Ambulatory Visit (INDEPENDENT_AMBULATORY_CARE_PROVIDER_SITE_OTHER): Payer: Medicare PPO | Admitting: Internal Medicine

## 2014-02-09 VITALS — BP 122/64 | HR 72 | Temp 97.8°F | Ht 70.0 in | Wt 230.0 lb

## 2014-02-09 DIAGNOSIS — J9611 Chronic respiratory failure with hypoxia: Secondary | ICD-10-CM

## 2014-02-09 DIAGNOSIS — I5032 Chronic diastolic (congestive) heart failure: Secondary | ICD-10-CM

## 2014-02-09 DIAGNOSIS — J189 Pneumonia, unspecified organism: Secondary | ICD-10-CM

## 2014-02-09 DIAGNOSIS — E1121 Type 2 diabetes mellitus with diabetic nephropathy: Secondary | ICD-10-CM

## 2014-02-09 NOTE — Patient Instructions (Addendum)
Please continue all other medications as before, and refills have been done if requested.  Please have the pharmacy call with any other refills you may need.  Please continue your efforts at being more active, low cholesterol diet, and weight control.  Please keep your appointments with your specialists as you may have planned     

## 2014-02-09 NOTE — Progress Notes (Signed)
Subjective:    Patient ID: [name], male    DOB: 18-Aug-1932, 80 y.o.   MRN: [id number]  HPI  Here to fu recent pna, Pt denies chest pain, increased sob or doe, wheezing, orthopnea, PND, increased LE swelling, palpitations, dizziness or syncope. No fever or worsening prod cough.  Still with some difficulty swallowing, family concerned for aspiration.  Pt denies new neurological symptoms such as new headache, or facial or extremity weakness or numbness   MRSA + at hospn.  Leg wounds healed,  Pt denies polydipsia, polyuria,   Levemir now at 40 units bid.Swelling resolved to LE's. Plans to ask card tomorrow about ? Of reducing torsemide. Past Medical History  Diagnosis Date  . Chronic diastolic heart failure     secondary to diastolic dysfunction EF (previous EF 35-45%)ef 60%4/09  . Arrhythmia     atrial fibrillation /pt on amiodarone,thought to be poor coumadin  . Stroke   . Other and unspecified hyperlipidemia   . Hypertension   . Hypertrophy of prostate without urinary obstruction and other lower urinary tract symptoms (LUTS)   . Osteoarthrosis, unspecified whether generalized or localized, unspecified site   . Type II or unspecified type diabetes mellitus without mention of complication, not stated as uncontrolled   . AV block, Mobitz 1     Intolerance to ACE's / discontiuation of beta blockers  . Obstructive sleep apnea   . Iron deficiency anemia, unspecified     s/p EGD and coloscopy 3/10.gastritis.hemorrhids  . Cerebrovascular accident   . Renal insufficiency   . Obesity   . Allergic rhinitis, cause unspecified 05/29/2010  . CAD (coronary artery disease)     cath 12/04: mLAD 50-60%, mCFX 20-30%, pRCA 50-60%, mRCA 40%  . Normochromic normocytic anemia 04/21/2013  . Abnormal CXR 04/21/2013  . Hypertensive kidney disease with CKD stage III 04/21/2013  . Prostate cancer    Past Surgical History  Procedure Laterality Date  . Prostate surgery    . Prostate surgury      hx of  .  Esophagogastroduodenoscopy N/A 04/21/2013    Procedure: ESOPHAGOGASTRODUODENOSCOPY (EGD);  Surgeon: Irene Shipper, MD;  Location: [facility];  Service: Endoscopy;  Laterality: N/A;    reports that he has quit smoking. He has never used smokeless tobacco. He reports that he does not drink alcohol or use illicit drugs. family history includes CAD in his sister; Dementia in his mother; Heart disease in his father. Allergies  Allergen Reactions  . Ace Inhibitors Other (See Comments)    HYPOTENSION  . Beta Adrenergic Blockers Other (See Comments)     use cautiously secondary to 2nd degree heart block, hypotension   Current Outpatient Prescriptions on File Prior to Visit  Medication Sig Dispense Refill  . allopurinol (ZYLOPRIM) 100 MG tablet Take 1 tablet (100 mg total) by mouth at bedtime. 30 tablet 11  . amiodarone (PACERONE) 200 MG tablet Take 100 mg by mouth every morning.    Marland Kitchen amLODipine (NORVASC) 10 MG tablet Take 10 mg by mouth daily.    Marland Kitchen aspirin EC 325 MG tablet Take 325 mg by mouth daily.    Marland Kitchen atorvastatin (LIPITOR) 40 MG tablet TAKE 1 TABLET BY MOUTH EVERY DAY 30 tablet 6  . citalopram (CELEXA) 10 MG tablet Take 20 mg by mouth daily.     . Fe Fum-Vit C-Vit B12-FA (TRIGELS-F) 460-60-0.01-1 MG CAPS capsule Take 1 capsule by mouth daily. 30 capsule 6  . Fluticasone-Salmeterol (ADVAIR) 100-50 MCG/DOSE AEPB Inhale 1  puff into the lungs as needed (for shortness of breath).     . hydrALAZINE (APRESOLINE) 100 MG tablet Take 100 mg by mouth 3 (three) times daily.  6  . Insulin Detemir (LEVEMIR) 100 UNIT/ML Pen Inject 40 Units into the skin 2 (two) times daily.    . isosorbide mononitrate (IMDUR) 120 MG 24 hr tablet Take 1 tablet (120 mg total) by mouth daily. 30 tablet 0  . levothyroxine (SYNTHROID, LEVOTHROID) 125 MCG tablet Take 125 mcg by mouth every morning.  11  . metolazone (ZAROXOLYN) 2.5 MG tablet Take 1 tablet (2.5 mg total) by mouth daily as needed (for weight gain, take 1 potassium  tablet with this as well). 10 tablet 3  . neomycin-bacitracin-polymyxin (NEOSPORIN) OINT Apply 1 application topically daily.    Marland Kitchen omeprazole (PRILOSEC) 20 MG capsule Take 20 mg by mouth daily.    . silver sulfADIAZINE (SILVADENE) 1 % cream Apply topically daily. Topical, Daily, For 21 days Apply to Bilateral LE ulcerations twice daily for three days (Wednesday through Friday) then decrease dressing changes to daily and continue for 21 days. 50 g 0  . torsemide (DEMADEX) 20 MG tablet Take 3 tablets (60mg ) in am and 2 tablets (40mg ) in pm (Patient taking differently: Take 40-60 mg by mouth 2 (two) times daily. Take 3 tablets (60mg ) in am and 2 tablets (40mg ) in pm) 150 tablet 3   No current facility-administered medications on file prior to visit.    Review of Systems  Constitutional: Negative for unusual diaphoresis or other sweats  HENT: Negative for ringing in ear Eyes: Negative for double vision or worsening visual disturbance.  Respiratory: Negative for choking and stridor.   Gastrointestinal: Negative for vomiting or other signifcant bowel change Genitourinary: Negative for hematuria or decreased urine volume.  Musculoskeletal: Negative for other MSK pain or swelling Skin: Negative for color change and worsening wound.  Neurological: Negative for tremors and numbness other than noted  Psychiatric/Behavioral: Negative for decreased concentration or agitation other than above       Objective:   Physical Exam BP 122/64 mmHg  Pulse 72  Temp(Src) 97.8 F (36.6 C) (Oral)  Ht 5\' 10"  (1.778 m)  Wt 230 lb (104.327 kg)  BMI 33.00 kg/m2  SpO2 94% VS noted,  Constitutional: Pt appears well-developed, well-nourished.  HENT: Head: NCAT.  Right Ear: External ear normal.  Left Ear: External ear normal.  Eyes: . Pupils are equal, round, and reactive to light. Conjunctivae and EOM are normal Neck: Normal range of motion. Neck supple.  Cardiovascular: Normal rate and regular rhythm.     Pulmonary/Chest: Effort normal and breath sounds without rales or wheezing.  Abd:  Soft, NT, ND, + BS, no flank tender Neurological: Pt is alert. Not confused , motor grossly intact Skin: Skin is warm. No rash, no LE edema or wounds Psychiatric: Pt behavior is normal. No agitation.   Wt Readings from Last 3 Encounters:  02/09/14 230 lb (104.327 kg)  02/03/14 229 lb 9.6 oz (104.146 kg)  01/31/14 238 lb 8.6 oz (108.2 kg)       Assessment & Plan:

## 2014-02-09 NOTE — Progress Notes (Signed)
Pre visit review using our clinic review tool, if applicable. No additional management support is needed unless otherwise documented below in the visit note. 

## 2014-02-10 ENCOUNTER — Encounter (HOSPITAL_COMMUNITY): Payer: Medicare PPO

## 2014-02-10 ENCOUNTER — Telehealth: Payer: Self-pay | Admitting: Internal Medicine

## 2014-02-10 NOTE — Telephone Encounter (Signed)
Trying to locate Plan of care.  Order number [id number].  Beginning date 11/07 - 1/05

## 2014-02-17 ENCOUNTER — Other Ambulatory Visit (HOSPITAL_COMMUNITY): Payer: Self-pay | Admitting: Cardiology

## 2014-02-17 ENCOUNTER — Other Ambulatory Visit: Payer: Self-pay | Admitting: Internal Medicine

## 2014-02-17 MED ORDER — POTASSIUM CHLORIDE CRYS ER 20 MEQ PO TBCR: 20.0000 meq | EXTENDED_RELEASE_TABLET | Freq: Every day | ORAL | Status: DC

## 2014-02-17 NOTE — Telephone Encounter (Signed)
[facility] called needing a refill for the patient for Hydralazone 100 mg tablet. I gave a verbal order for [facility] to [facility] Hydralazone 100 mg tablet, take 1 tablet 3 x daily, with 6 refills. I informed [facility] if there is anything else to please give our office a call back, they verbalized understanding.

## 2014-02-17 NOTE — Telephone Encounter (Signed)
Faxes for allopurinol also received- refilled x 11 on 12/30/13 by Dr.[name]      Clonidine patch- denied faxed returned to pharmacy as this was d/c on discharge 01/31/14

## 2014-02-18 ENCOUNTER — Other Ambulatory Visit (HOSPITAL_COMMUNITY): Payer: Self-pay | Admitting: Internal Medicine

## 2014-02-18 ENCOUNTER — Encounter: Payer: Self-pay | Admitting: Internal Medicine

## 2014-02-18 DIAGNOSIS — I5022 Chronic systolic (congestive) heart failure: Secondary | ICD-10-CM

## 2014-02-18 NOTE — Assessment & Plan Note (Signed)
stable overall by history and exam, recent data reviewed with pt, and pt to continue medical treatment as before,  to f/u any worsening symptoms or concerns Lab Results  Component Value Date   HGBA1C 7.1* 01/28/2014

## 2014-02-18 NOTE — Assessment & Plan Note (Addendum)
Improved, stable, for swallowing study as well ,  to f/u any worsening symptoms or concerns  Note:  Total time for pt hx, exam, review of record with pt in the room, determination of diagnoses and plan for further eval and tx is > 40 min, with over 50% spent in coordination and counseling of patient

## 2014-02-18 NOTE — Assessment & Plan Note (Signed)
stable overall by history and exam, and pt to continue medical treatment as before,  to f/u any worsening symptoms or concern 

## 2014-02-18 NOTE — Assessment & Plan Note (Signed)
stable overall by history and exam, recent data reviewed with pt,and pt to continue medical treatment as before,  to f/u any worsening symptoms or concerns, to cont home o2 [place] SpO2 Readings from Last 3 Encounters:  02/09/14 94%  01/31/14 99%  01/06/14 91%

## 2014-02-24 ENCOUNTER — Other Ambulatory Visit (HOSPITAL_BASED_OUTPATIENT_CLINIC_OR_DEPARTMENT_OTHER): Payer: Medicare PPO

## 2014-02-24 ENCOUNTER — Ambulatory Visit: Payer: Medicare PPO

## 2014-02-24 DIAGNOSIS — C61 Malignant neoplasm of prostate: Secondary | ICD-10-CM | POA: Diagnosis not present

## 2014-02-24 DIAGNOSIS — D631 Anemia in chronic kidney disease: Secondary | ICD-10-CM

## 2014-02-24 DIAGNOSIS — C7951 Secondary malignant neoplasm of bone: Secondary | ICD-10-CM | POA: Diagnosis not present

## 2014-02-24 DIAGNOSIS — D509 Iron deficiency anemia, unspecified: Secondary | ICD-10-CM

## 2014-02-24 DIAGNOSIS — E119 Type 2 diabetes mellitus without complications: Secondary | ICD-10-CM | POA: Diagnosis not present

## 2014-02-24 DIAGNOSIS — N189 Chronic kidney disease, unspecified: Secondary | ICD-10-CM

## 2014-02-24 DIAGNOSIS — N183 Chronic kidney disease, stage 3 unspecified: Secondary | ICD-10-CM

## 2014-02-24 DIAGNOSIS — I5033 Acute on chronic diastolic (congestive) heart failure: Secondary | ICD-10-CM | POA: Diagnosis not present

## 2014-02-24 LAB — CBC WITH DIFFERENTIAL/PLATELET
BASO%: 0.2 % (ref 0.0–2.0)
Basophils Absolute: 0 10*3/uL (ref 0.0–0.1)
EOS%: 4.2 % (ref 0.0–7.0)
Eosinophils Absolute: 0.3 10*3/uL (ref 0.0–0.5)
HCT: 39.1 % (ref 38.4–49.9)
HEMOGLOBIN: 11.6 g/dL — AB (ref 13.0–17.1)
LYMPH%: 9.2 % — AB (ref 14.0–49.0)
MCH: 24.2 pg — ABNORMAL LOW (ref 27.2–33.4)
MCHC: 29.7 g/dL — ABNORMAL LOW (ref 32.0–36.0)
MCV: 81.4 fL (ref 79.3–98.0)
MONO#: 0.5 10*3/uL (ref 0.1–0.9)
MONO%: 7.1 % (ref 0.0–14.0)
NEUT%: 79.3 % — ABNORMAL HIGH (ref 39.0–75.0)
NEUTROS ABS: 5.9 10*3/uL (ref 1.5–6.5)
PLATELETS: 249 10*3/uL (ref 140–400)
RBC: 4.8 10*6/uL (ref 4.20–5.82)
RDW: 22.5 % — AB (ref 11.0–14.6)
WBC: 7.5 10*3/uL (ref 4.0–10.3)
lymph#: 0.7 10*3/uL — ABNORMAL LOW (ref 0.9–3.3)

## 2014-02-24 NOTE — Progress Notes (Signed)
Hgb: 11.6. Patient aware.  No aranesp given.

## 2014-02-25 ENCOUNTER — Encounter: Payer: Self-pay | Admitting: Oncology

## 2014-02-25 NOTE — Progress Notes (Signed)
Dr Phylliss Bob @ [facility], [phone], called about aranesp prior authorization, he states that because the patient does not have any recent iron labs they will have to deny the request, once that is done we can resubmit the request and get an approval.

## 2014-03-02 ENCOUNTER — Other Ambulatory Visit: Payer: Self-pay | Admitting: Oncology

## 2014-03-02 ENCOUNTER — Telehealth: Payer: Self-pay | Admitting: Internal Medicine

## 2014-03-02 DIAGNOSIS — D500 Iron deficiency anemia secondary to blood loss (chronic): Secondary | ICD-10-CM

## 2014-03-02 MED ORDER — GLUCOSE BLOOD VI STRP: 1.0000 | ORAL_STRIP | Freq: Three times a day (TID) | Status: DC

## 2014-03-02 MED ORDER — ONETOUCH ULTRASOFT LANCETS MISC: Status: DC

## 2014-03-02 NOTE — Telephone Encounter (Signed)
Called daughter dad was given accu check aviva but he doesn't know how to use machine. He is wanting refills sent on one touch ultra for strips and lancets. Inform daughter [facility],,,/lmb

## 2014-03-02 NOTE — Telephone Encounter (Signed)
Pt daughter called said that pt needs refill on needles and test strips for old machine.  It was the Ultra.  The new machine is not working for him and they want go back to the old ones.  Can we call in the old needles and test strips to pharmacy?    Needles and test Strips

## 2014-03-10 ENCOUNTER — Inpatient Hospital Stay (HOSPITAL_COMMUNITY)
Admission: EM | Admit: 2014-03-10 | Discharge: 2014-03-13 | DRG: 291 | Disposition: A | Discharge status: Home Health | Payer: Medicare PPO | Attending: Internal Medicine | Admitting: Internal Medicine

## 2014-03-10 ENCOUNTER — Emergency Department (HOSPITAL_COMMUNITY): Payer: Medicare PPO

## 2014-03-10 ENCOUNTER — Encounter (HOSPITAL_COMMUNITY): Payer: Self-pay | Admitting: Emergency Medicine

## 2014-03-10 DIAGNOSIS — E1122 Type 2 diabetes mellitus with diabetic chronic kidney disease: Secondary | ICD-10-CM | POA: Diagnosis present

## 2014-03-10 DIAGNOSIS — Z8546 Personal history of malignant neoplasm of prostate: Secondary | ICD-10-CM | POA: Diagnosis not present

## 2014-03-10 DIAGNOSIS — N184 Chronic kidney disease, stage 4 (severe): Secondary | ICD-10-CM

## 2014-03-10 DIAGNOSIS — Z993 Dependence on wheelchair: Secondary | ICD-10-CM

## 2014-03-10 DIAGNOSIS — E1129 Type 2 diabetes mellitus with other diabetic kidney complication: Secondary | ICD-10-CM | POA: Diagnosis present

## 2014-03-10 DIAGNOSIS — N183 Chronic kidney disease, stage 3 unspecified: Secondary | ICD-10-CM | POA: Diagnosis present

## 2014-03-10 DIAGNOSIS — Z87891 Personal history of nicotine dependence: Secondary | ICD-10-CM | POA: Diagnosis not present

## 2014-03-10 DIAGNOSIS — I10 Essential (primary) hypertension: Secondary | ICD-10-CM | POA: Diagnosis present

## 2014-03-10 DIAGNOSIS — N400 Enlarged prostate without lower urinary tract symptoms: Secondary | ICD-10-CM | POA: Diagnosis present

## 2014-03-10 DIAGNOSIS — I5033 Acute on chronic diastolic (congestive) heart failure: Secondary | ICD-10-CM | POA: Diagnosis present

## 2014-03-10 DIAGNOSIS — I5032 Chronic diastolic (congestive) heart failure: Secondary | ICD-10-CM | POA: Diagnosis present

## 2014-03-10 DIAGNOSIS — I5031 Acute diastolic (congestive) heart failure: Secondary | ICD-10-CM

## 2014-03-10 DIAGNOSIS — R0602 Shortness of breath: Secondary | ICD-10-CM

## 2014-03-10 DIAGNOSIS — R0902 Hypoxemia: Secondary | ICD-10-CM | POA: Diagnosis present

## 2014-03-10 DIAGNOSIS — I2510 Atherosclerotic heart disease of native coronary artery without angina pectoris: Secondary | ICD-10-CM | POA: Diagnosis present

## 2014-03-10 DIAGNOSIS — I441 Atrioventricular block, second degree: Secondary | ICD-10-CM | POA: Diagnosis present

## 2014-03-10 DIAGNOSIS — Z8673 Personal history of transient ischemic attack (TIA), and cerebral infarction without residual deficits: Secondary | ICD-10-CM

## 2014-03-10 DIAGNOSIS — E039 Hypothyroidism, unspecified: Secondary | ICD-10-CM | POA: Diagnosis present

## 2014-03-10 DIAGNOSIS — D469 Myelodysplastic syndrome, unspecified: Secondary | ICD-10-CM | POA: Diagnosis present

## 2014-03-10 DIAGNOSIS — Z6834 Body mass index (BMI) 34.0-34.9, adult: Secondary | ICD-10-CM

## 2014-03-10 DIAGNOSIS — I480 Paroxysmal atrial fibrillation: Secondary | ICD-10-CM | POA: Diagnosis present

## 2014-03-10 DIAGNOSIS — Z8249 Family history of ischemic heart disease and other diseases of the circulatory system: Secondary | ICD-10-CM

## 2014-03-10 DIAGNOSIS — I130 Hypertensive heart and chronic kidney disease with heart failure and stage 1 through stage 4 chronic kidney disease, or unspecified chronic kidney disease: Secondary | ICD-10-CM | POA: Diagnosis present

## 2014-03-10 DIAGNOSIS — G4733 Obstructive sleep apnea (adult) (pediatric): Secondary | ICD-10-CM | POA: Diagnosis present

## 2014-03-10 DIAGNOSIS — Z7409 Other reduced mobility: Secondary | ICD-10-CM | POA: Diagnosis present

## 2014-03-10 DIAGNOSIS — E785 Hyperlipidemia, unspecified: Secondary | ICD-10-CM | POA: Diagnosis present

## 2014-03-10 DIAGNOSIS — M1990 Unspecified osteoarthritis, unspecified site: Secondary | ICD-10-CM | POA: Diagnosis present

## 2014-03-10 HISTORY — DX: Pneumonia, unspecified organism: J18.9

## 2014-03-10 HISTORY — DX: Reserved for inherently not codable concepts without codable children: IMO0001

## 2014-03-10 HISTORY — DX: Chronic obstructive pulmonary disease, unspecified: J44.9

## 2014-03-10 LAB — BASIC METABOLIC PANEL
ANION GAP: 11 (ref 5–15)
BUN: 70 mg/dL — ABNORMAL HIGH (ref 6–23)
CALCIUM: 9 mg/dL (ref 8.4–10.5)
CHLORIDE: 104 meq/L (ref 96–112)
CO2: 23 mmol/L (ref 19–32)
Creatinine, Ser: 2.39 mg/dL — ABNORMAL HIGH (ref 0.50–1.35)
GFR calc non Af Amer: 24 mL/min — ABNORMAL LOW (ref >90)
GFR, EST AFRICAN AMERICAN: 28 mL/min — AB (ref >90)
Glucose, Bld: 172 mg/dL — ABNORMAL HIGH (ref 70–99)
Potassium: 5.2 mmol/L — ABNORMAL HIGH (ref 3.5–5.1)
Sodium: 138 mmol/L (ref 135–145)

## 2014-03-10 LAB — TROPONIN I: Troponin I: 0.05 ng/mL — ABNORMAL HIGH (ref <0.031)

## 2014-03-10 LAB — MRSA PCR SCREENING: MRSA by PCR: NEGATIVE

## 2014-03-10 LAB — CBC WITH DIFFERENTIAL/PLATELET
BASOS PCT: 0 % (ref 0–1)
Basophils Absolute: 0 10*3/uL (ref 0.0–0.1)
EOS ABS: 0.3 10*3/uL (ref 0.0–0.7)
Eosinophils Relative: 4 % (ref 0–5)
HEMATOCRIT: 36.4 % — AB (ref 39.0–52.0)
HEMOGLOBIN: 11.2 g/dL — AB (ref 13.0–17.0)
LYMPHS ABS: 0.5 10*3/uL — AB (ref 0.7–4.0)
Lymphocytes Relative: 7 % — ABNORMAL LOW (ref 12–46)
MCH: 24.9 pg — ABNORMAL LOW (ref 26.0–34.0)
MCHC: 30.8 g/dL (ref 30.0–36.0)
MCV: 80.9 fL (ref 78.0–100.0)
MONO ABS: 0.5 10*3/uL (ref 0.1–1.0)
MONOS PCT: 6 % (ref 3–12)
NEUTROS PCT: 83 % — AB (ref 43–77)
Neutro Abs: 6.1 10*3/uL (ref 1.7–7.7)
Platelets: 279 10*3/uL (ref 150–400)
RBC: 4.5 MIL/uL (ref 4.22–5.81)
RDW: 21 % — ABNORMAL HIGH (ref 11.5–15.5)
WBC: 7.4 10*3/uL (ref 4.0–10.5)

## 2014-03-10 LAB — BRAIN NATRIURETIC PEPTIDE: B Natriuretic Peptide: 456.3 pg/mL — ABNORMAL HIGH (ref 0.0–100.0)

## 2014-03-10 LAB — CBG MONITORING, ED: GLUCOSE-CAPILLARY: 143 mg/dL — AB (ref 70–99)

## 2014-03-10 LAB — GLUCOSE, CAPILLARY
GLUCOSE-CAPILLARY: 152 mg/dL — AB (ref 70–99)
Glucose-Capillary: 194 mg/dL — ABNORMAL HIGH (ref 70–99)

## 2014-03-10 MED ORDER — PANTOPRAZOLE SODIUM 40 MG PO TBEC
40.0000 mg | DELAYED_RELEASE_TABLET | Freq: Every day | ORAL | Status: DC
  Administered 2014-03-11 – 2014-03-13 (×3): 40 mg via ORAL
  Filled 2014-03-10 (×2): qty 1

## 2014-03-10 MED ORDER — LEVOTHYROXINE SODIUM 125 MCG PO TABS
125.0000 ug | ORAL_TABLET | Freq: Every morning | ORAL | Status: DC
  Administered 2014-03-11 – 2014-03-13 (×3): 125 ug via ORAL
  Filled 2014-03-10 (×4): qty 1

## 2014-03-10 MED ORDER — AMLODIPINE BESYLATE 10 MG PO TABS
10.0000 mg | ORAL_TABLET | Freq: Every day | ORAL | Status: DC
  Administered 2014-03-11 – 2014-03-13 (×3): 10 mg via ORAL
  Filled 2014-03-10 (×3): qty 1

## 2014-03-10 MED ORDER — INSULIN DETEMIR 100 UNIT/ML ~~LOC~~ SOLN
20.0000 [IU] | Freq: Two times a day (BID) | SUBCUTANEOUS | Status: DC
  Administered 2014-03-10 – 2014-03-13 (×6): 20 [IU] via SUBCUTANEOUS
  Filled 2014-03-10 (×7): qty 0.2

## 2014-03-10 MED ORDER — SODIUM CHLORIDE 0.9 % IJ SOLN: 3.0000 mL | INTRAMUSCULAR | Status: DC | PRN

## 2014-03-10 MED ORDER — POTASSIUM CHLORIDE CRYS ER 20 MEQ PO TBCR: 20.0000 meq | EXTENDED_RELEASE_TABLET | Freq: Two times a day (BID) | ORAL | Status: DC

## 2014-03-10 MED ORDER — ATORVASTATIN CALCIUM 40 MG PO TABS
40.0000 mg | ORAL_TABLET | Freq: Every day | ORAL | Status: DC
  Administered 2014-03-10 – 2014-03-13 (×4): 40 mg via ORAL
  Filled 2014-03-10 (×4): qty 1

## 2014-03-10 MED ORDER — MOMETASONE FURO-FORMOTEROL FUM 100-5 MCG/ACT IN AERO
2.0000 | INHALATION_SPRAY | Freq: Two times a day (BID) | RESPIRATORY_TRACT | Status: DC
  Administered 2014-03-11 – 2014-03-12 (×4): 2 via RESPIRATORY_TRACT
  Filled 2014-03-10: qty 8.8

## 2014-03-10 MED ORDER — CITALOPRAM HYDROBROMIDE 20 MG PO TABS
20.0000 mg | ORAL_TABLET | Freq: Every day | ORAL | Status: DC
  Administered 2014-03-11 – 2014-03-13 (×3): 20 mg via ORAL
  Filled 2014-03-10 (×3): qty 1

## 2014-03-10 MED ORDER — ASPIRIN EC 81 MG PO TBEC
81.0000 mg | DELAYED_RELEASE_TABLET | Freq: Every day | ORAL | Status: DC
  Administered 2014-03-11 – 2014-03-13 (×3): 81 mg via ORAL
  Filled 2014-03-10 (×3): qty 1

## 2014-03-10 MED ORDER — FUROSEMIDE 10 MG/ML IJ SOLN
40.0000 mg | INTRAMUSCULAR | Status: AC
  Administered 2014-03-10: 40 mg via INTRAVENOUS
  Filled 2014-03-10: qty 4

## 2014-03-10 MED ORDER — ACETAMINOPHEN 325 MG PO TABS: 650.0000 mg | ORAL_TABLET | ORAL | Status: DC | PRN

## 2014-03-10 MED ORDER — ISOSORBIDE MONONITRATE ER 60 MG PO TB24
60.0000 mg | ORAL_TABLET | Freq: Every day | ORAL | Status: DC
  Administered 2014-03-11 – 2014-03-13 (×3): 60 mg via ORAL
  Filled 2014-03-10 (×3): qty 1

## 2014-03-10 MED ORDER — ASPIRIN 81 MG PO CHEW
324.0000 mg | CHEWABLE_TABLET | Freq: Once | ORAL | Status: DC
  Filled 2014-03-10: qty 4

## 2014-03-10 MED ORDER — ONDANSETRON HCL 4 MG/2ML IJ SOLN: 4.0000 mg | Freq: Four times a day (QID) | INTRAMUSCULAR | Status: DC | PRN

## 2014-03-10 MED ORDER — INSULIN ASPART 100 UNIT/ML ~~LOC~~ SOLN: 0.0000 [IU] | Freq: Every day | SUBCUTANEOUS | Status: DC

## 2014-03-10 MED ORDER — ALLOPURINOL 100 MG PO TABS
100.0000 mg | ORAL_TABLET | Freq: Every day | ORAL | Status: DC
  Administered 2014-03-10 – 2014-03-12 (×3): 100 mg via ORAL
  Filled 2014-03-10 (×4): qty 1

## 2014-03-10 MED ORDER — INSULIN ASPART 100 UNIT/ML ~~LOC~~ SOLN
0.0000 [IU] | Freq: Three times a day (TID) | SUBCUTANEOUS | Status: DC
  Administered 2014-03-10: 2 [IU] via SUBCUTANEOUS

## 2014-03-10 MED ORDER — AMIODARONE HCL 100 MG PO TABS
100.0000 mg | ORAL_TABLET | Freq: Every day | ORAL | Status: DC
  Administered 2014-03-11 – 2014-03-13 (×3): 100 mg via ORAL
  Filled 2014-03-10 (×3): qty 1

## 2014-03-10 MED ORDER — SODIUM CHLORIDE 0.9 % IJ SOLN
3.0000 mL | Freq: Two times a day (BID) | INTRAMUSCULAR | Status: DC
  Administered 2014-03-10 – 2014-03-13 (×7): 3 mL via INTRAVENOUS

## 2014-03-10 MED ORDER — FUROSEMIDE 10 MG/ML IJ SOLN
80.0000 mg | Freq: Two times a day (BID) | INTRAMUSCULAR | Status: DC
  Administered 2014-03-10 – 2014-03-12 (×4): 80 mg via INTRAVENOUS
  Filled 2014-03-10 (×6): qty 8

## 2014-03-10 MED ORDER — INSULIN DETEMIR 100 UNIT/ML FLEXPEN: 40.0000 [IU] | PEN_INJECTOR | Freq: Two times a day (BID) | SUBCUTANEOUS | Status: DC

## 2014-03-10 MED ORDER — FUROSEMIDE 10 MG/ML IJ SOLN
40.0000 mg | Freq: Once | INTRAMUSCULAR | Status: AC
  Administered 2014-03-10: 40 mg via INTRAVENOUS
  Filled 2014-03-10: qty 4

## 2014-03-10 MED ORDER — HYDRALAZINE HCL 50 MG PO TABS
100.0000 mg | ORAL_TABLET | Freq: Three times a day (TID) | ORAL | Status: DC
  Administered 2014-03-10 – 2014-03-13 (×8): 100 mg via ORAL
  Filled 2014-03-10 (×10): qty 2

## 2014-03-10 MED ORDER — SODIUM CHLORIDE 0.9 % IV SOLN: 250.0000 mL | INTRAVENOUS | Status: DC | PRN

## 2014-03-10 MED ORDER — NITROGLYCERIN 0.4 MG SL SUBL: 0.4000 mg | SUBLINGUAL_TABLET | SUBLINGUAL | Status: DC | PRN

## 2014-03-10 MED ORDER — HEPARIN SODIUM (PORCINE) 5000 UNIT/ML IJ SOLN
5000.0000 [IU] | Freq: Three times a day (TID) | INTRAMUSCULAR | Status: DC
  Administered 2014-03-10 – 2014-03-13 (×9): 5000 [IU] via SUBCUTANEOUS
  Filled 2014-03-10 (×12): qty 1

## 2014-03-10 NOTE — ED Notes (Addendum)
[facility] Pt [facility] at 0930 at center and became very SOB with any kind of exertion. Pt abd very distended, per pt more than normal. Per ems Pt unable to urinate at this time. Per EMS pt lungs clear, however audible expiratory wheezing noted on arrival. Per EMS pt maintaining sats at 91% on 4L. Pt groaning while breathing. Per EMS pt has hx of CHF and LVH. Per EMS pt has significant edema in lower extremities, pt does not normally walk, normally uses motorized chair. Pt alert and oriented. Hx of stroke, with left sided facial abnormality as deficit and slightly slurred speech.

## 2014-03-10 NOTE — ED Notes (Signed)
Cardiology at bedside at this time

## 2014-03-10 NOTE — ED Notes (Signed)
Pt sats dropped to 88% on room air, pt resting, no exertion at this time. Placed on 2 L nasal cannula. Sats back up to 92%
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
## 2014-03-10 NOTE — ED Notes (Signed)
Pt taken off 4L nasal cannula per MD at bedside. Pt stable at this time, maintaining sats at 96%.

## 2014-03-10 NOTE — H&P (Addendum)
Chief Complaint Dyspnea, CHF  Requesting Physician: ED  HPI: This is a 81 y.o. male with a past medical history significant for chronic diastolic CHF. He has been seen the CHF clinic, last OV Nov 2015. His last echo was Sept 2015- Nl LVF with grade 3 DD. He lives alone and has been basically wheel chair bound for the past year. Other medical problems include PAF (on Amio, not a Coumadin candidate), DM, CRI, CVA, Myelodysplastic syndrome, and moderate 3V CAD. He is admitted now with acute on chronic dyspnea. He is noted to be in CHF by CXR, and BNP.  He is also noted to be AF with slow VR (on Amiodarone 100 mg daily).   PMHx:  Past Medical History  Diagnosis Date  . Chronic diastolic heart failure     secondary to diastolic dysfunction EF (previous EF 35-45%)ef 60%4/09  . Arrhythmia     atrial fibrillation /pt on amiodarone,thought to be poor coumadin  . Stroke   . Other and unspecified hyperlipidemia   . Hypertension   . Hypertrophy of prostate without urinary obstruction and other lower urinary tract symptoms (LUTS)   . Osteoarthrosis, unspecified whether generalized or localized, unspecified site   . Type II or unspecified type diabetes mellitus without mention of complication, not stated as uncontrolled   . AV block, Mobitz 1     Intolerance to ACE's / discontiuation of beta blockers  . Obstructive sleep apnea   . Iron deficiency anemia, unspecified     s/p EGD and coloscopy 3/10.gastritis.hemorrhids  . Cerebrovascular accident   . Renal insufficiency   . Obesity   . Allergic rhinitis, cause unspecified 05/29/2010  . CAD (coronary artery disease)     cath 12/04: mLAD 50-60%, mCFX 20-30%, pRCA 50-60%, mRCA 40%  . Normochromic normocytic anemia 04/21/2013  . Abnormal CXR 04/21/2013  . Hypertensive kidney disease with CKD stage III 04/21/2013  . Prostate cancer     Past Surgical History  Procedure Laterality Date  . Prostate surgery    . Prostate surgury      hx of    . Esophagogastroduodenoscopy N/A 04/21/2013    Procedure: ESOPHAGOGASTRODUODENOSCOPY (EGD);  Surgeon: Irene Shipper, MD;  Location: [facility];  Service: Endoscopy;  Laterality: N/A;    SOCHx:  reports that he has quit smoking. He has never used smokeless tobacco. He reports that he does not drink alcohol or use illicit drugs.  FAMHx: Family History  Problem Relation Age of Onset  . Heart disease Father   . Dementia Mother   . CAD Sister     ALLERGIES: Allergies  Allergen Reactions  . Ace Inhibitors Other (See Comments)    HYPOTENSION  . Beta Adrenergic Blockers Other (See Comments)     use cautiously secondary to 2nd degree heart block, hypotension    ROS: A comprehensive review of systems was negative except where noted in HPI.  HOME MEDICATIONS: Prior to Admission medications   Medication Sig Start Date End Date Taking? Authorizing Provider  allopurinol (ZYLOPRIM) 100 MG tablet Take 1 tablet (100 mg total) by mouth at bedtime. 12/30/13   Biagio Borg, MD  amiodarone (PACERONE) 200 MG tablet Take 100 mg by mouth every morning.    Historical Provider, MD  amLODipine (NORVASC) 10 MG tablet Take 10 mg by mouth daily.    Historical Provider, MD  amLODipine (NORVASC) 10 MG tablet TAKE 1 TABLET BY MOUTH EVERY DAY 02/18/14   Shaune Pascal  Bensimhon, MD  aspirin EC 325 MG tablet Take 325 mg by mouth daily.    Historical Provider, MD  atorvastatin (LIPITOR) 40 MG tablet TAKE 1 TABLET BY MOUTH EVERY DAY 12/10/13   Jolaine Artist, MD  citalopram (CELEXA) 10 MG tablet Take 20 mg by mouth daily.  07/29/12   Biagio Borg, MD  Fluticasone-Salmeterol (ADVAIR) 100-50 MCG/DOSE AEPB Inhale 1 puff into the lungs as needed (for shortness of breath).  06/05/12   Renato Shin, MD  glucose blood (ONE TOUCH ULTRA TEST) test strip 1 each by Other route 3 (three) times daily. Use as directed three times daily to check blood sugar.  Diagnosis code E11.29 03/02/14   Biagio Borg, MD  hydrALAZINE  (APRESOLINE) 100 MG tablet Take 100 mg by mouth 3 (three) times daily. 01/13/14   Historical Provider, MD  Insulin Detemir (LEVEMIR) 100 UNIT/ML Pen Inject 40 Units into the skin 2 (two) times daily. 01/30/14   Verlee Monte, MD  isosorbide mononitrate (IMDUR) 120 MG 24 hr tablet Take 1 tablet (120 mg total) by mouth daily. 01/30/14   Verlee Monte, MD  Lancets (ONETOUCH ULTRASOFT) lancets Use to help check blood sugar three times a day Dx E11.29 03/02/14   Biagio Borg, MD  levothyroxine (SYNTHROID, LEVOTHROID) 125 MCG tablet Take 125 mcg by mouth every morning. 12/26/13   Historical Provider, MD  metolazone (ZAROXOLYN) 2.5 MG tablet Take 1 tablet (2.5 mg total) by mouth daily as needed (for weight gain, take 1 potassium tablet with this as well). 12/22/13   Jolaine Artist, MD  neomycin-bacitracin-polymyxin (NEOSPORIN) OINT Apply 1 application topically daily.    Historical Provider, MD  omeprazole (PRILOSEC) 20 MG capsule Take 20 mg by mouth daily. 08/07/13   Jolaine Artist, MD  potassium chloride SA (K-DUR,KLOR-CON) 20 MEQ tablet Take 1 tablet (20 mEq total) by mouth daily. Along with metolazone as needed once daily for weight gain 02/17/14   Jolaine Artist, MD  silver sulfADIAZINE (SILVADENE) 1 % cream Apply topically daily. Topical, Daily, For 21 days Apply to Bilateral LE ulcerations twice daily for three days (Wednesday through Friday) then decrease dressing changes to daily and continue for 21 days. 10/26/13   Modena Jansky, MD  torsemide (DEMADEX) 20 MG tablet Take 3 tablets (60mg ) in am and 2 tablets (40mg ) in pm Patient taking differently: Take 40-60 mg by mouth 2 (two) times daily. Take 3 tablets (60mg ) in am and 2 tablets (40mg ) in pm 12/11/13   Rande Brunt, NP  TRIGELS-F FORTE 460-60-0.01-1 MG CAPS capsule TAKE 1 CAPSULE BY MOUTH DAILY. 02/22/14   Biagio Borg, MD    HOSPITAL MEDICATIONS: I have reviewed the patient's current medications.  VITALS: Blood pressure 153/65,  pulse 55, temperature 98.3 F (36.8 C), temperature source Oral, resp. rate 12, SpO2 98 %.  PHYSICAL EXAM: General appearance: cooperative, no distress, mildly obese and slurred speech, a little sleepy (prior stroke) Neck: no carotid bruit, JVP 14-16 cm Lungs: scattered rhonchi Heart: irregularly irregular rhythm, 2/6 SEM RUSB, no S3 Abdomen: not distended, not tender Extremities: chronic skin changes, 1+ edema to knees bilaterally.  Pulses: diminnished  Skin: cool and dry Neurologic: Grossly normal slurred speech  LABS: Results for orders placed or performed during the hospital encounter of 03/10/14 (from the past 24 hour(s))  Basic metabolic panel     Status: Abnormal   Collection Time: 03/10/14 10:39 AM  Result Value Ref Range   Sodium 138 135 -  145 mmol/L   Potassium 5.2 (H) 3.5 - 5.1 mmol/L   Chloride 104 96 - 112 mEq/L   CO2 23 19 - 32 mmol/L   Glucose, Bld 172 (H) 70 - 99 mg/dL   BUN 70 (H) 6 - 23 mg/dL   Creatinine, Ser 2.39 (H) 0.50 - 1.35 mg/dL   Calcium 9.0 8.4 - 10.5 mg/dL   GFR calc non Af Amer 24 (L) >90 mL/min   GFR calc Af Amer 28 (L) >90 mL/min   Anion gap 11 5 - 15  Troponin I     Status: Abnormal   Collection Time: 03/10/14 10:39 AM  Result Value Ref Range   Troponin I 0.05 (H) <0.031 ng/mL  CBC with Differential     Status: Abnormal   Collection Time: 03/10/14 10:39 AM  Result Value Ref Range   WBC 7.4 4.0 - 10.5 K/uL   RBC 4.50 4.22 - 5.81 MIL/uL   Hemoglobin 11.2 (L) 13.0 - 17.0 g/dL   HCT 36.4 (L) 39.0 - 52.0 %   MCV 80.9 78.0 - 100.0 fL   MCH 24.9 (L) 26.0 - 34.0 pg   MCHC 30.8 30.0 - 36.0 g/dL   RDW 21.0 (H) 11.5 - 15.5 %   Platelets 279 150 - 400 K/uL   Neutrophils Relative % 83 (H) 43 - 77 %   Neutro Abs 6.1 1.7 - 7.7 K/uL   Lymphocytes Relative 7 (L) 12 - 46 %   Lymphs Abs 0.5 (L) 0.7 - 4.0 K/uL   Monocytes Relative 6 3 - 12 %   Monocytes Absolute 0.5 0.1 - 1.0 K/uL   Eosinophils Relative 4 0 - 5 %   Eosinophils Absolute 0.3 0.0 - 0.7  K/uL   Basophils Relative 0 0 - 1 %   Basophils Absolute 0.0 0.0 - 0.1 K/uL  Brain natriuretic peptide     Status: Abnormal   Collection Time: 03/10/14 10:39 AM  Result Value Ref Range   B Natriuretic Peptide 456.3 (H) 0.0 - 100.0 pg/mL    EKG: AF  IMAGING: CHF  IMPRESSION: Principal Problem:   Acute on chronic diastolic congestive heart failure Active Problems:   Type 2 diabetes mellitus with renal manifestations   Essential hypertension   CAD - moderate 3V CAD April 2012   Paroxysmal atrial fibrillation- in AF 03/10/14   Chronic diastolic heart failure   Second degree AV block, Mobitz type I   Chronic renal insufficiency, stage III-IV   Hypothyroid   Dyslipidemia   History of prostate cancer- 15 yrs ago   Myelodysplastic syndrome-hx of transfusions   Limited mobility- wheel chair for the last year   RECOMMENDATION: Admit, IV diuretics  Time Spent Directly with Patient: 45 minutes  Erlene Quan [phone] beeper 03/10/2014, 3:10 PM   Patient seen with PA, agree with the above note.   1. Acute on chronic diastolic CHF: Patient is very volume overloaded on exam.  Pulmonary edema on CXR.  He says he has not missed his diuretics.  BUN/creatinine high, cardiorenal syndrome complicates the picture.  He is in atrial fibrillation with controlled rate.  Last ECG in 12/15 showed sinus rhythm with Wenckebach block.  Going into atrial fibrillation may have helped trigger exacerbation.   - Lasix 80 mg IV bid, 1st dose now.  Strict I/Os, daily weight.  2. Atrial fibrillation: Paroxysmal.  He is in atrial fibrillation today.  Last ECG showed Wenckebach block.  Rate is controlled.  He is on amiodarone at low dose  but is thought to be a poor candidate for anticoagulation likely due to fall risk, so he is not anticoagulated.   - Continue amiodarone for now.  - If we are not going to anticoagulate him (agree he is a poor candidate), cardioversion will not be an option.  3. CKD: Stage IV.   BUN/creatinine are elevated, up mildly from his baseline which is > 2.  Suspect cardiorenal syndrome.  Follow closely with diuresis.  4. Elevated troponin: Suspect this is demand ischemia due to volume overload.  Will cycle cardiac enzymes but doubt true ACS.  Prophylactic heparin, ASA.  5. Baseline very poor functional status, wheelchair-bound.  ?needs SNF.  Will get PT/OT evaluation.   Loralie Champagne 03/10/2014 3:31 PM

## 2014-03-10 NOTE — ED Notes (Addendum)
Pt continues to rest comfortably. Pt has urinated 200 cc thus far after 40 mg lasix. Daughter at bedside, great historian.

## 2014-03-11 ENCOUNTER — Other Ambulatory Visit: Payer: Self-pay

## 2014-03-11 DIAGNOSIS — N189 Chronic kidney disease, unspecified: Secondary | ICD-10-CM

## 2014-03-11 DIAGNOSIS — R9431 Abnormal electrocardiogram [ECG] [EKG]: Secondary | ICD-10-CM

## 2014-03-11 DIAGNOSIS — I495 Sick sinus syndrome: Secondary | ICD-10-CM

## 2014-03-11 DIAGNOSIS — I509 Heart failure, unspecified: Secondary | ICD-10-CM

## 2014-03-11 LAB — GLUCOSE, CAPILLARY
GLUCOSE-CAPILLARY: 213 mg/dL — AB (ref 70–99)
GLUCOSE-CAPILLARY: 222 mg/dL — AB (ref 70–99)
Glucose-Capillary: 150 mg/dL — ABNORMAL HIGH (ref 70–99)
Glucose-Capillary: 174 mg/dL — ABNORMAL HIGH (ref 70–99)

## 2014-03-11 LAB — CBC
HCT: 33.5 % — ABNORMAL LOW (ref 39.0–52.0)
Hemoglobin: 10.2 g/dL — ABNORMAL LOW (ref 13.0–17.0)
MCH: 25.2 pg — ABNORMAL LOW (ref 26.0–34.0)
MCHC: 30.4 g/dL (ref 30.0–36.0)
MCV: 82.9 fL (ref 78.0–100.0)
Platelets: 263 10*3/uL (ref 150–400)
RBC: 4.04 MIL/uL — ABNORMAL LOW (ref 4.22–5.81)
RDW: 21 % — ABNORMAL HIGH (ref 11.5–15.5)
WBC: 7.5 10*3/uL (ref 4.0–10.5)

## 2014-03-11 LAB — COMPREHENSIVE METABOLIC PANEL
ALT: 17 U/L (ref 0–53)
AST: 19 U/L (ref 0–37)
Albumin: 2.7 g/dL — ABNORMAL LOW (ref 3.5–5.2)
Alkaline Phosphatase: 105 U/L (ref 39–117)
Anion gap: 7 (ref 5–15)
BUN: 65 mg/dL — ABNORMAL HIGH (ref 6–23)
CO2: 27 mmol/L (ref 19–32)
Calcium: 8.7 mg/dL (ref 8.4–10.5)
Chloride: 106 mEq/L (ref 96–112)
Creatinine, Ser: 2.31 mg/dL — ABNORMAL HIGH (ref 0.50–1.35)
GFR calc Af Amer: 29 mL/min — ABNORMAL LOW (ref >90)
GFR calc non Af Amer: 25 mL/min — ABNORMAL LOW (ref >90)
Glucose, Bld: 196 mg/dL — ABNORMAL HIGH (ref 70–99)
Potassium: 4.1 mmol/L (ref 3.5–5.1)
Sodium: 140 mmol/L (ref 135–145)
Total Bilirubin: 0.5 mg/dL (ref 0.3–1.2)
Total Protein: 6.6 g/dL (ref 6.0–8.3)

## 2014-03-11 LAB — HEMOGLOBIN A1C
Hgb A1c MFr Bld: 7.4 % — ABNORMAL HIGH (ref <5.7)
Mean Plasma Glucose: 166 mg/dL — ABNORMAL HIGH (ref <117)

## 2014-03-11 LAB — TSH: TSH: 4.473 u[IU]/mL (ref 0.350–4.500)

## 2014-03-11 MED ORDER — SILVER SULFADIAZINE 1 % EX CREA
TOPICAL_CREAM | Freq: Every day | CUTANEOUS | Status: DC
  Administered 2014-03-11 – 2014-03-12 (×2): via TOPICAL
  Filled 2014-03-11: qty 85

## 2014-03-11 MED ORDER — INSULIN ASPART 100 UNIT/ML ~~LOC~~ SOLN
0.0000 [IU] | Freq: Three times a day (TID) | SUBCUTANEOUS | Status: DC
  Administered 2014-03-11: 3 [IU] via SUBCUTANEOUS
  Administered 2014-03-11: 1 [IU] via SUBCUTANEOUS
  Administered 2014-03-11: 3 [IU] via SUBCUTANEOUS
  Administered 2014-03-12: 2 [IU] via SUBCUTANEOUS
  Administered 2014-03-12: 3 [IU] via SUBCUTANEOUS
  Administered 2014-03-12 – 2014-03-13 (×2): 1 [IU] via SUBCUTANEOUS

## 2014-03-11 NOTE — Progress Notes (Signed)
Nutrition Brief Note  Patient identified on the Low Braden Risk Report  Wt Readings from Last 15 Encounters:  03/11/14 237 lb 10.5 oz (107.8 kg)  02/09/14 230 lb (104.327 kg)  02/03/14 229 lb 9.6 oz (104.146 kg)  01/31/14 238 lb 8.6 oz (108.2 kg)  01/06/14 236 lb (107.049 kg)  12/16/13 234 lb 4.8 oz (106.278 kg)  12/03/13 242 lb (109.77 kg)  11/30/13 232 lb 5.8 oz (105.4 kg)  11/11/13 240 lb (108.863 kg)  11/04/13 238 lb 3.2 oz (108.047 kg)  10/26/13 238 lb 8.6 oz (108.2 kg)  09/23/13 240 lb 14.4 oz (109.272 kg)  09/15/13 240 lb (108.863 kg)  08/26/13 240 lb 6.4 oz (109.045 kg)  08/12/13 243 lb 3.2 oz (110.315 kg)    Body mass index is 34.1 kg/(m^2). Patient meets criteria for Obesity based on current BMI.   Current diet order is Heart Healthy/ Carb Modified, patient is consuming approximately 100%% of meals at this time. Labs and medications reviewed. Pt reports no weight or appetite loss.  Pt reports following a No Added Salt, Carb Modified diet at home and has no questions at this time.  No nutrition interventions warranted at this time. If nutrition issues arise, please consult RD.   Elmer Picker MS Dietetic Intern Pager Number [phone]

## 2014-03-11 NOTE — Progress Notes (Signed)
Informed by nursing staff regarding telemetry change, it appears patient has converted from a-fib to wenkebach with occasional 2:1 block. Not on any rate control medication. Would continue on low dose amiodarone given his tendency to go into a-fib.   Hilbert Corrigan PA Pager: [phone]

## 2014-03-11 NOTE — Progress Notes (Signed)
Echocardiogram 2D Echocardiogram has been performed.  [name] 03/11/2014, 10:48 AM

## 2014-03-11 NOTE — Consult Note (Addendum)
WOC wound consult note Reason for Consult: Consult requested for bilat legs.  Wound type: Right great toe is 100% dry eschar; 2X1cm, no open wound or drainage.  Topical treatment will not be effective for this site.  It is best practice to leave dry eschar intact. Left leg without significant open wounds or drainage.  .2X.2X.1cm partial thickness wound to upper calf with pink dry wound bed. No topical treatment needed. Left heel with 2X2cm darker colored dry callous, no open wound, drainage, or topical treatment needed. Right outer leg with several healing full thickness wounds.  Affected area is 7X5X.1cm cm and has patchy areas of several wounds; dry yellow wound beds without odor or drainage. Pt was using Silvadene prior to admission. Dressing procedure/placement/frequency: Float heels to reduce pressure.  Continue present plan of care with Silvadene to right leg wounds to assist with removal of nonviable tissue and promote healing. Please re-consult if further assistance is needed.  Gae Dry MSN, RN, [place], [place], [place].

## 2014-03-11 NOTE — Care Management Note (Unsigned)
    Page 1 of 2   03/12/2014     1:38:02 PM CARE MANAGEMENT NOTE 03/12/2014  Patient:  [name], [name]   Account Number:  [id number]  Date Initiated:  03/11/2014  Documentation initiated by:  Halona Amstutz  Subjective/Objective Assessment:   Pt adm on 03/10/14 with CHF.  PTA, pt resides at home alone. He has [facility]HA 2hrs/day, and family members in and out throughout the day.     Action/Plan:   PT recommending SNF/24h supervision; pt refuses SNF.  2 sons at bedside: state they will see if brothers and sisters can rotate staying with pt at night for a while when he is discharged.   Anticipated DC Date:  03/14/2014   Anticipated DC Plan:  [place]  CM consult      Choice offered to / List presented to:             Status of service:   Medicare Important Message given?   (If response is "NO", the following Medicare IM given date fields will be blank) Date Medicare IM given:   Medicare IM given by:   Date Additional Medicare IM given:   Additional Medicare IM given by:    Discharge Disposition:    Per UR Regulation:  Reviewed for med. necessity/level of care/duration of stay  If discussed at [place] of Stay Meetings, dates discussed:    Comments:    03/12/14 Ellan Lambert, RN, BSN [phone] Spoke with daughter, Denyce Robert, by phone to confirm need for hosp bed.  She states they do need bed, and would also like to get pt a lift chair, if possible.  Informed daughter that Medicare may not reimburse for lift chair, and daughter states that is not a problem.  Faxed orders for DME to [facility], per insurance contracts--left daughter's information for delivery.  Cell # for daughter is [phone]. Called [facility] to confirm receipt of fax; they are closed for inclement weather.  They will hopefully reopen within 1-2 days and notify this CM regarding needed DME.  121/16 Ellan Lambert, RN, BSN [phone] [facility]  [facility], for about half of the day, per sons.  He has RW, BSC, shower chair, WC at home.  Pt has hx of HH with [facility], and is agreeable to [facility] services at dc, if needed again.  MD: Please leave order for hospital bed, [facility] for CHF follow up, HHPT/OT, with face to face documentation. Thanks!

## 2014-03-11 NOTE — Progress Notes (Signed)
Made PA aware of telemetry strip changes no new orders. Will continue to monitor patient for further changes in condition.

## 2014-03-11 NOTE — Progress Notes (Signed)
Inpatient Diabetes Program Recommendations  AACE/ADA: New Consensus Statement on Inpatient Glycemic Control (2013)  Target Ranges:  Prepandial:   less than 140 mg/dL      Peak postprandial:   less than 180 mg/dL (1-2 hours)      Critically ill patients:  140 - 180 mg/dL   Consult received for "new diagnosis, A1C 7.4"  Pt has a documented history of DM and is on Levemir insulin 40 units BID at home.   Inpatient Diabetes Program Recommendations HgbA1C: =7.4 Will follow during this admission. Thank you  Raoul Pitch BSN, RN,CDE Inpatient Diabetes Coordinator [phone] (team pager)

## 2014-03-11 NOTE — Progress Notes (Signed)
Advanced Heart Failure Rounding Note   Subjective:    81 y.o. male with a past medical history significant for chronic diastolic CHF. Other medical problems include PAF (on Amio, not a Coumadin candidate), DM, CRI, CVA, Myelodysplastic syndrome, and moderate 3V CAD.   Admitted 1/20 with acute on chronic HF with marked volume overload and AF with slow VR (on Amiodarone 100 mg daily).   Diuresing well. Weight down 3 pounds on IV lasix. Breathing better. Remains bradycardic with HR in 50s.    Objective:   Weight Range:  Vital Signs:   Temp:  [97.7 F (36.5 C)-98.1 F (36.7 C)] 98.1 F (36.7 C) (01/21 0444) Pulse Rate:  [52-82] 82 (01/21 0444) Resp:  [12-28] 18 (01/21 0444) BP: (140-165)/(61-74) 159/66 mmHg (01/21 0956) SpO2:  [93 %-98 %] 94 % (01/21 0444) Weight:  [107.8 kg (237 lb 10.5 oz)-109.2 kg (240 lb 11.9 oz)] 107.8 kg (237 lb 10.5 oz) (01/21 0444) Last BM Date: 03/10/14  Weight change: Filed Weights   03/10/14 1648 03/11/14 0444  Weight: 109.2 kg (240 lb 11.9 oz) 107.8 kg (237 lb 10.5 oz)    Intake/Output:   Intake/Output Summary (Last 24 hours) at 03/11/14 1251 Last data filed at 03/11/14 0844  Gross per 24 hour  Intake   1220 ml  Output   2125 ml  Net   -905 ml     PHYSICAL EXAM: General appearance: chronically ill appearing  no distress, mildly obese and slurred speech, a little sleepy (prior stroke) Neck: no carotid bruit, JVP ear  Lungs: scattered rhonchi Heart: irregularly irregular rhythm, 2/6 SEM RUSB, no S3 Abdomen: + distended, not tender. Good bowel sounds Extremities: chronic skin changes,2+ edema to knees bilaterally.  Pulses: diminnished  Skin: cool and dry Neurologic: Grossly normal.  slurred speech  Telemetry: AF with slow VR 50s  Labs: Basic Metabolic Panel:  Recent Labs Lab 03/10/14 1039 03/11/14 0354  NA 138 140  K 5.2* 4.1  CL 104 106  CO2 23 27  GLUCOSE 172* 196*  BUN 70* 65*  CREATININE 2.39* 2.31*  CALCIUM 9.0 8.7     Liver Function Tests:  Recent Labs Lab 03/11/14 0354  AST 19  ALT 17  ALKPHOS 105  BILITOT 0.5  PROT 6.6  ALBUMIN 2.7*   No results for input(s): LIPASE, AMYLASE in the last 168 hours. No results for input(s): AMMONIA in the last 168 hours.  CBC:  Recent Labs Lab 03/10/14 1039 03/11/14 0354  WBC 7.4 7.5  NEUTROABS 6.1  --   HGB 11.2* 10.2*  HCT 36.4* 33.5*  MCV 80.9 82.9  PLT 279 263    Cardiac Enzymes:  Recent Labs Lab 03/10/14 1039  TROPONINI 0.05*    BNP: BNP (last 3 results)  Recent Labs  10/21/13 2012 11/28/13 1215 01/27/14 1709  PROBNP 1541.0* 1597.0* 2020.0*     Other results:    Imaging: Dg Chest Port 1 View  03/10/2014   CLINICAL DATA:  Shortness of Breath  EXAM: PORTABLE CHEST - 1 VIEW  COMPARISON:  01/27/2014  FINDINGS: Patchy airspace disease again noted throughout the lungs. This is unchanged since prior study. Cardiomegaly. No effusions. No acute bony abnormality.  IMPRESSION: Stable patchy bilateral airspace disease with cardiomegaly. A component of this is chronic lung disease. It is difficult to exclude superimposed edema or infection.   Electronically Signed   By: Rolm Baptise M.D.   On: 03/10/2014 11:11      Medications:     Scheduled Medications: .  allopurinol  100 mg Oral QHS  . amiodarone  100 mg Oral Daily  . amLODipine  10 mg Oral Daily  . aspirin  324 mg Oral Once  . aspirin EC  81 mg Oral Daily  . atorvastatin  40 mg Oral Daily  . citalopram  20 mg Oral Daily  . furosemide  80 mg Intravenous BID  . heparin  5,000 Units Subcutaneous 3 times per day  . hydrALAZINE  100 mg Oral TID  . insulin aspart  0-5 Units Subcutaneous QHS  . insulin aspart  0-9 Units Subcutaneous TID WC  . insulin detemir  20 Units Subcutaneous BID  . isosorbide mononitrate  60 mg Oral Daily  . levothyroxine  125 mcg Oral q morning - 10a  . mometasone-formoterol  2 puff Inhalation BID  . pantoprazole  40 mg Oral Daily  . silver  sulfADIAZINE   Topical Daily  . sodium chloride  3 mL Intravenous Q12H     Infusions:     PRN Medications:  sodium chloride, acetaminophen, nitroGLYCERIN, ondansetron (ZOFRAN) IV, sodium chloride   Assessment/Plan:   1. Acute on chronic diastolic CHF: Weight not up significantly from baseline but appears very volume overloaded on exam. Pulmonary edema on CXR. Renal function currently stable ardiorenal syndrome complicates the picture. He is in atrial fibrillation with controlled rate. Last ECG in 12/15 showed sinus rhythm with Wenckebach block. Going into atrial fibrillation may have helped trigger exacerbation.Also has cardiorenal syndrome.   - Continue Lasix 80 mg IV bid. Watch renal function closely. 2. Paroxysmal Atrial fibrillation with slow VR: He is in atrial fibrillation now. Last ECG showed Wenckebach block. Rate is controlled. He is on amiodarone at low dose but is thought to be a poor candidate for anticoagulation likely due to fall risk, so he is not anticoagulated.  - Continue low-dose amiodarone for now. No indication for PPM currently. - If we are not going to anticoagulate him (agree he is a poor candidate), cardioversion will not be an option.  3. CKD: Stage IV. BUN/creatinine are elevated, up mildly from his baseline which is > 2. Suspect cardiorenal syndrome. Follow closely with diuresis.  4. Elevated troponin: Suspect due to HF. No ischemia. Can stop heparin 5. Baseline very poor functional status, wheelchair-bound. ?needs SNF. Will get PT/OT evaluation.   Daniel Bensimhon,MD 12:58 PM  Length of Stay: 1 Advanced Heart Failure Team Pager [phone] (M-F; 7a - 4p)  Please contact [facility] for night-coverage after hours (4p -7a ) and weekends on [email or web address]

## 2014-03-11 NOTE — Evaluation (Signed)
Physical Therapy Evaluation Patient Details Name: [name] MRN: [id number] DOB: [birth year] Today's Date: 03/11/2014   History of Present Illness  82 y.o. male with a past medical history significant for chronic diastolic CHF. Other medical problems include PAF (on Amio, not a Coumadin candidate), DM, CRI, CVA, Myelodysplastic syndrome, and moderate 3V CAD. Admitted with worsening HF and SOB.  Clinical Impression  Pt presents with the below listed impairments and will benefit from skilled PT intervention to address these impairments and increase functional mobility and independence. Pt was primary a w/c user (power w/c) but was able to perform independent stand pivot transfers. Currently pt requires mod to max A for transfers using RW. Pt is adamant about wanting to live alone and unclear if family would be able to provide support if needed for pt to go home. He did mention possibly staying with his daughter but refuses option for SNF. If he goes home, recommend family for support and HHPT follow up.     Follow Up Recommendations Supervision/Assistance - 24 hour;SNF (Pt likely to refuse SNF. If he goes home, HHPT)    Equipment Recommendations  None recommended by PT    Recommendations for Other Services OT consult     Precautions / Restrictions Precautions Precautions: Fall Restrictions Weight Bearing Restrictions: No      Mobility  Bed Mobility Overal bed mobility: Needs Assistance Bed Mobility: Supine to Sit     Supine to sit: Min guard     General bed mobility comments: Pt used rails and HOB elevated. Needed slight steady A for balance when bringing trunk up.  Transfers Overall transfer level: Needs assistance Equipment used: Rolling walker (2 wheeled) Transfers: Sit to/from [facility] Sit to Stand: Mod assist;Max assist Stand pivot transfers: Mod assist       General transfer comment: Pt with multiple attempts for sit to stand from EOB with  cues for technique and hand placement with heavy mod to max A. Slighlty elevated bed and used RW for UE support and pt able to peform with mod A including pivot to the recliner. Cues to reach back to recliner with cues for eccentric control (lacking).  Ambulation/Gait             General Gait Details: Pt has not been ambulatory x 1 year per his report. Feel that it could be trialled in future session with close w/c follow for functional strengthening based of his technique with stand pivot.   Stairs            Wheelchair Mobility    Modified Rankin (Stroke Patients Only)       Balance Overall balance assessment: Needs assistance Sitting-balance support: Feet supported;Single extremity supported;Bilateral upper extremity supported Sitting balance-Leahy Scale: Fair     Standing balance support: Bilateral upper extremity supported;During functional activity Standing balance-Leahy Scale: Poor Standing balance comment: Better with RW for UE support. Still requires up to mod A                             Pertinent Vitals/Pain Pain Assessment: No/denies pain    Home Living Family/patient expects to be discharged to:: Private residence Living Arrangements: Alone Available Help at Discharge: Family;Available PRN/intermittently Type of Home: Apartment Home Access: Ramped entrance     Home Layout: One level Home Equipment: Wheelchair - power;Walker - 4 wheels Additional Comments: Pt uses w/c for all mobility. Reports he was able to perform stand pivot  transfers but has not ambulated in about a year.    Prior Function Level of Independence: Needs assistance   Gait / Transfers Assistance Needed: Independent with transfers w/c level. No gait per pt report x 1 year  ADL's / Homemaking Assistance Needed: Pt reports having a CNA come in the mornings to assist with ADLs. Has hired assistance for housekeeping.  Comments: Pt reports going to [facility] daily during  the week (bus would pick him up and drop him off) for exercises, Bingo, socialization.      Hand Dominance   Dominant Hand: Right    Extremity/Trunk Assessment   Upper Extremity Assessment: Generalized weakness           Lower Extremity Assessment: Generalized weakness (LLE slightly weaker than R; proximal weakness functionally)      Cervical / Trunk Assessment: Kyphotic  Communication   Communication: Expressive difficulties  Cognition Arousal/Alertness: Awake/alert Behavior During Therapy: WFL for tasks assessed/performed Overall Cognitive Status: Impaired/Different from baseline Area of Impairment: Orientation Orientation Level: Disoriented to;Time             General Comments: Pt generally cognitively appropriate. Some cues needed for problem solving and safety due to quick movements.     General Comments General comments (skin integrity, edema, etc.): Pt reports h/o neuropathy in BLE; multiple skin tears noted on LE's with bandages. O2 on 2L during activity initially dropped to 81% but with rest  and cues for deep breathing techniques remained at 90-91%    Exercises General Exercises - Lower Extremity Ankle Circles/Pumps: AROM;Strengthening;Both;5 reps Long Arc Quad: AROM;AAROM;Strengthening;Both;5 reps;Seated      Assessment/Plan    PT Assessment Patient needs continued PT services  PT Diagnosis Difficulty walking;Generalized weakness   PT Problem List Decreased strength;Decreased activity tolerance;Decreased range of motion;Decreased mobility;Decreased balance;Decreased cognition;Decreased knowledge of use of DME;Cardiopulmonary status limiting activity;Decreased skin integrity  PT Treatment Interventions DME instruction;Gait training;Stair training;Functional mobility training;Therapeutic activities;Therapeutic exercise;Balance training;Neuromuscular re-education;Patient/family education;Wheelchair mobility training   PT Goals (Current goals can be found  in the Care Plan section) Acute Rehab PT Goals Patient Stated Goal: go home PT Goal Formulation: With patient Time For Goal Achievement: 03/25/14 Potential to Achieve Goals: Good    Frequency Min 3X/week (monitor pending d/c plan)   Barriers to discharge Decreased caregiver support Pt lives alone. Unclear what support he would have available from family. He does not want to go to SNF or have someone stay with him.     Co-evaluation               End of Session Equipment Utilized During Treatment: Gait belt;Oxygen Activity Tolerance: Patient tolerated treatment well Patient left: in chair;with call bell/phone within reach Nurse Communication: Mobility status         Time: 1355-1431 PT Time Calculation (min) (ACUTE ONLY): 36 min   Charges:   PT Evaluation $Initial PT Evaluation Tier I: 1 Procedure PT Treatments $Therapeutic Activity: 23-37 mins   PT G Codes:        Allayne Gitelman 03/11/2014, 3:12 PM

## 2014-03-12 ENCOUNTER — Telehealth: Payer: Self-pay | Admitting: Nurse Practitioner

## 2014-03-12 DIAGNOSIS — I441 Atrioventricular block, second degree: Secondary | ICD-10-CM

## 2014-03-12 LAB — BASIC METABOLIC PANEL
Anion gap: 9 (ref 5–15)
BUN: 60 mg/dL — ABNORMAL HIGH (ref 6–23)
CO2: 27 mmol/L (ref 19–32)
Calcium: 8.8 mg/dL (ref 8.4–10.5)
Chloride: 108 mEq/L (ref 96–112)
Creatinine, Ser: 2.29 mg/dL — ABNORMAL HIGH (ref 0.50–1.35)
GFR calc Af Amer: 30 mL/min — ABNORMAL LOW (ref >90)
GFR calc non Af Amer: 25 mL/min — ABNORMAL LOW (ref >90)
Glucose, Bld: 131 mg/dL — ABNORMAL HIGH (ref 70–99)
Potassium: 3.9 mmol/L (ref 3.5–5.1)
Sodium: 144 mmol/L (ref 135–145)

## 2014-03-12 LAB — GLUCOSE, CAPILLARY
GLUCOSE-CAPILLARY: 224 mg/dL — AB (ref 70–99)
Glucose-Capillary: 124 mg/dL — ABNORMAL HIGH (ref 70–99)
Glucose-Capillary: 186 mg/dL — ABNORMAL HIGH (ref 70–99)
Glucose-Capillary: 153 mg/dL — ABNORMAL HIGH (ref 70–99)

## 2014-03-12 MED ORDER — TORSEMIDE 20 MG PO TABS
60.0000 mg | ORAL_TABLET | Freq: Every day | ORAL | Status: DC
  Administered 2014-03-13: 60 mg via ORAL
  Filled 2014-03-12 (×3): qty 3

## 2014-03-12 MED ORDER — TORSEMIDE 20 MG PO TABS
40.0000 mg | ORAL_TABLET | Freq: Every day | ORAL | Status: DC
  Administered 2014-03-12: 40 mg via ORAL
  Filled 2014-03-12 (×2): qty 2

## 2014-03-12 NOTE — Progress Notes (Signed)
Physical Therapy Treatment Patient Details Name: [name] MRN: [id number] DOB: 09-24-1932 Today's Date: 29-Mar-2014    History of Present Illness 81 y.o. male with a past medical history significant for chronic diastolic CHF. Other medical problems include PAF (on Amio, not a Coumadin candidate), DM, CRI, CVA, Myelodysplastic syndrome, and moderate 3V CAD. Admitted with worsening HF and SOB.    PT Comments    Pt with improved transfer to chair but will still need physical assistance for transfers at home.  Follow Up Recommendations  Home health PT;Supervision/Assistance - 24 hour (pt refuses SNF.)     Equipment Recommendations  None recommended by PT    Recommendations for Other Services       Precautions / Restrictions Precautions Precautions: Fall Restrictions Weight Bearing Restrictions: No    Mobility  Bed Mobility Overal bed mobility: Needs Assistance Bed Mobility: Supine to Sit     Supine to sit: Min assist     General bed mobility comments: Assist to bring trunk up  Transfers Overall transfer level: Needs assistance Equipment used: 1 person hand held assist Transfers: Stand Pivot Transfers;Sit to/from Stand Sit to Stand: Min assist Stand pivot transfers: Min assist       General transfer comment: Assist to bring hips up and for balance. Pt using armrests of chair for balance and support.  Ambulation/Gait                 Stairs            Wheelchair Mobility    Modified Rankin (Stroke Patients Only)       Balance   Sitting-balance support: No upper extremity supported;Feet supported Sitting balance-Leahy Scale: Fair       Standing balance-Leahy Scale: Poor Standing balance comment: min A for static standing                    Cognition Arousal/Alertness: Awake/alert Behavior During Therapy: WFL for tasks assessed/performed Overall Cognitive Status: Within Functional Limits for tasks assessed                      Exercises      General Comments        Pertinent Vitals/Pain Pain Assessment: No/denies pain    Home Living                      Prior Function            PT Goals (current goals can now be found in the care plan section) Progress towards PT goals: Progressing toward goals    Frequency  Min 3X/week    PT Plan Discharge plan needs to be updated    Co-evaluation             End of Session Equipment Utilized During Treatment: Gait belt;Oxygen Activity Tolerance: Patient tolerated treatment well Patient left: in chair;with call bell/phone within reach     Time: 0902-0918 PT Time Calculation (min) (ACUTE ONLY): 16 min  Charges:  $Therapeutic Activity: 8-22 mins                    G Codes:      Thurman Sarver 2014/03/29, 10:20 AM  Koa Zoeller PT [phone]

## 2014-03-12 NOTE — Telephone Encounter (Signed)
Almyra Free with Case Management calling - needing order for hospital bed.  I told her she could have a verbal order for the hospital bed under Dr. Haroldine Laws.   Burtis Junes, RN, [facility] [address] [place], [place]  [id number] [phone]

## 2014-03-12 NOTE — Evaluation (Signed)
Occupational Therapy Evaluation Patient Details Name: [name] MRN: [id number] DOB: Dec 30, 1932 Today's Date: 03/12/2014    History of Present Illness 81 y.o. male with a past medical history significant for chronic diastolic CHF. Other medical problems include PAF (on Amio, not a Coumadin candidate), DM, CRI, CVA, Myelodysplastic syndrome, and moderate 3V CAD. Admitted with worsening HF and SOB.   Clinical Impression   Pt admitted with above. He demonstrates the below listed deficits and will benefit from continued OT to maximize safety and independence with BADLs.  Pt presents to OT with generalized weakness and impaired balance.  He required assist with bathing and dressing PTA - has daily CNA and assist with IADLs.  He was however, able to to perform w/c <> toilet transfers and toileting modified independently.  Currently, he requires min A for transfer and max A for peri care and clothing manipulation.  He will need 24 hour assist at discharge, and HHOT      Follow Up Recommendations  Home health OT;Supervision/Assistance - 24 hour    Equipment Recommendations  None recommended by OT    Recommendations for Other Services       Precautions / Restrictions Precautions Precautions: Fall      Mobility Bed Mobility Overal bed mobility: Needs Assistance Bed Mobility: Supine to Sit     Supine to sit: Min assist     General bed mobility comments: Assist to bring trunk up  Transfers Overall transfer level: Needs assistance Equipment used: 1 person hand held assist Transfers: Sit to/from Stand;Stand Pivot Transfers Sit to Stand: Min assist Stand pivot transfers: Min assist       General transfer comment: assist to power up and for balance - posterior lean     Balance Overall balance assessment: Needs assistance Sitting-balance support: Feet supported Sitting balance-Leahy Scale: Fair   Postural control: Posterior lean Standing balance support: Single extremity  supported Standing balance-Leahy Scale: Poor Standing balance comment: min A for static standing                            ADL Overall ADL's : Needs assistance/impaired Eating/Feeding: Independent;Sitting   Grooming: Wash/dry hands;Wash/dry face;Oral care;Brushing hair;Set up;Sitting   Upper Body Bathing: Minimal assitance;Sitting   Lower Body Bathing: Maximal assistance;Sit to/from stand   Upper Body Dressing : Moderate assistance;Sitting   Lower Body Dressing: Maximal assistance;Sit to/from stand   Toilet Transfer: Minimal assistance;Stand-pivot;BSC   Toileting- Clothing Manipulation and Hygiene: Maximal assistance;Sit to/from stand Toileting - Clothing Manipulation Details (indicate cue type and reason): Pt incontinent of urine and stool  Assisted pt with clean up    Tub/Shower Transfer Details (indicate cue type and reason): Pt sponge bathes at home  Functional mobility during ADLs: Minimal assistance       Vision                     Perception     Praxis      Pertinent Vitals/Pain Pain Assessment: No/denies pain     Hand Dominance Right   Extremity/Trunk Assessment Upper Extremity Assessment Upper Extremity Assessment: Generalized weakness   Lower Extremity Assessment Lower Extremity Assessment: Defer to PT evaluation   Cervical / Trunk Assessment Cervical / Trunk Assessment: Kyphotic   Communication Communication Communication: Expressive difficulties (dysarthric )   Cognition Arousal/Alertness: Awake/alert Behavior During Therapy: WFL for tasks assessed/performed Overall Cognitive Status: Within Functional Limits for tasks assessed  General Comments       Exercises       Shoulder Instructions      Home Living Family/patient expects to be discharged to:: Private residence Living Arrangements: Alone Available Help at Discharge: Family;Available PRN/intermittently Type of Home: Apartment Home  Access: Ramped entrance     Home Layout: One level     Bathroom Shower/Tub: Teacher, early years/pre: Standard Bathroom Accessibility: Yes How Accessible: Accessible via walker Home Equipment: Wheelchair - power;Walker - 4 wheels;Bedside commode   Additional Comments: Pt uses w/c for all mobility. Reports he was able to perform stand pivot transfers but has not ambulated in about a year.      Prior Functioning/Environment Level of Independence: Needs assistance  Gait / Transfers Assistance Needed: Independent with transfers w/c level. No gait per pt report x 1 year ADL's / Homemaking Assistance Needed: Pt reports having a CNA come in the mornings to assist with ADLs. Has hired assistance for housekeeping.  Pt reports he was mod I with w/c <> toilet transfers and toileting .   Comments: Pt reports going to [facility] daily during the week (bus would pick him up and drop him off) for exercises, Bingo, socialization.     OT Diagnosis: Generalized weakness   OT Problem List: Decreased strength;Decreased activity tolerance;Impaired balance (sitting and/or standing)   OT Treatment/Interventions: Self-care/ADL training;DME and/or AE instruction;Therapeutic activities;Patient/family education;Balance training    OT Goals(Current goals can be found in the care plan section) Acute Rehab OT Goals Patient Stated Goal: go home OT Goal Formulation: With patient Time For Goal Achievement: 03/26/14 Potential to Achieve Goals: Good ADL Goals Pt Will Transfer to Toilet: with supervision;stand pivot transfer;bedside commode;regular height toilet Pt Will Perform Toileting - Clothing Manipulation and hygiene: with supervision;sit to/from stand  OT Frequency: Min 2X/week   Barriers to D/C: Decreased caregiver support  unsure if family cna provide necessary 24 hour assist        Co-evaluation              End of Session Equipment Utilized During Treatment: Oxygen Nurse  Communication: Mobility status  Activity Tolerance: Patient tolerated treatment well Patient left: in chair;with call bell/phone within reach   Time: 1213-1244 OT Time Calculation (min): 31 min Charges:  OT General Charges $OT Visit: 1 Procedure OT Evaluation $Initial OT Evaluation Tier I: 1 Procedure OT Treatments $Self Care/Home Management : 8-22 mins G-Codes:    Ollie Esty M Apr 08, 2014, 12:53 PM

## 2014-03-12 NOTE — Progress Notes (Signed)
Advanced Heart Failure Rounding Note   Subjective:    81 y.o. male with a past medical history significant for chronic diastolic CHF. Other medical problems include PAF (on Amio, not a Coumadin candidate), DM, CRI, CVA, Myelodysplastic syndrome, and moderate 3V CAD.   Admitted 1/20 with acute on chronic HF with marked volume overload and AF with slow VR (on Amiodarone 100 mg daily).   Diuresing ok. However weight unchanged over night. Renal function stable. Breathing better. Remains bradycardic with HR in 50s.    Objective:   Weight Range:  Vital Signs:   Temp:  [98.1 F (36.7 C)-98.2 F (36.8 C)] 98.2 F (36.8 C) (01/22 0500) Pulse Rate:  [73-90] 82 (01/22 0819) Resp:  [16-18] 16 (01/22 0500) BP: (143-161)/(60-89) 161/79 mmHg (01/22 0819) SpO2:  [95 %-97 %] 96 % (01/22 0819) FiO2 (%):  [28 %] 28 % (01/22 0817) Weight:  [107.6 kg (237 lb 3.4 oz)] 107.6 kg (237 lb 3.4 oz) (01/22 0500) Last BM Date: 03/11/14  Weight change: Filed Weights   03/10/14 1648 03/11/14 0444 03/12/14 0500  Weight: 109.2 kg (240 lb 11.9 oz) 107.8 kg (237 lb 10.5 oz) 107.6 kg (237 lb 3.4 oz)    Intake/Output:   Intake/Output Summary (Last 24 hours) at 03/12/14 1307 Last data filed at 03/12/14 1111  Gross per 24 hour  Intake   1210 ml  Output   1750 ml  Net   -540 ml     PHYSICAL EXAM: General appearance: chronically ill appearing sitting in chair NAD.  Neck: no carotid bruit, JVP 8 Lungs: scattered rhonchi Heart: irregularly irregular rhythm, 2/6 SEM RUSB, no S3 Abdomen: obese nondistended, not tender. Good bowel sounds Extremities: chronic skin changes, tr edema to knees bilaterally.  Pulses: diminnished  Skin: cool and dry Neurologic: Grossly normal.  slurred speech  Telemetry: NSR with wenckebach 70-80s  Labs: Basic Metabolic Panel:  Recent Labs Lab 03/10/14 1039 03/11/14 0354 03/12/14 0344  NA 138 140 144  K 5.2* 4.1 3.9  CL 104 106 108  CO2 23 27 27   GLUCOSE 172* 196*  131*  BUN 70* 65* 60*  CREATININE 2.39* 2.31* 2.29*  CALCIUM 9.0 8.7 8.8    Liver Function Tests:  Recent Labs Lab 03/11/14 0354  AST 19  ALT 17  ALKPHOS 105  BILITOT 0.5  PROT 6.6  ALBUMIN 2.7*   No results for input(s): LIPASE, AMYLASE in the last 168 hours. No results for input(s): AMMONIA in the last 168 hours.  CBC:  Recent Labs Lab 03/10/14 1039 03/11/14 0354  WBC 7.4 7.5  NEUTROABS 6.1  --   HGB 11.2* 10.2*  HCT 36.4* 33.5*  MCV 80.9 82.9  PLT 279 263    Cardiac Enzymes:  Recent Labs Lab 03/10/14 1039  TROPONINI 0.05*    BNP: BNP (last 3 results)  Recent Labs  10/21/13 2012 11/28/13 1215 01/27/14 1709  PROBNP 1541.0* 1597.0* 2020.0*     Other results:    Imaging: No results found.   Medications:     Scheduled Medications: . allopurinol  100 mg Oral QHS  . amiodarone  100 mg Oral Daily  . amLODipine  10 mg Oral Daily  . aspirin  324 mg Oral Once  . aspirin EC  81 mg Oral Daily  . atorvastatin  40 mg Oral Daily  . citalopram  20 mg Oral Daily  . furosemide  80 mg Intravenous BID  . heparin  5,000 Units Subcutaneous 3 times per day  .  hydrALAZINE  100 mg Oral TID  . insulin aspart  0-5 Units Subcutaneous QHS  . insulin aspart  0-9 Units Subcutaneous TID WC  . insulin detemir  20 Units Subcutaneous BID  . isosorbide mononitrate  60 mg Oral Daily  . levothyroxine  125 mcg Oral q morning - 10a  . mometasone-formoterol  2 puff Inhalation BID  . pantoprazole  40 mg Oral Daily  . silver sulfADIAZINE   Topical Daily  . sodium chloride  3 mL Intravenous Q12H    Infusions:    PRN Medications: sodium chloride, acetaminophen, nitroGLYCERIN, ondansetron (ZOFRAN) IV, sodium chloride   Assessment/Plan:   1. Acute on chronic diastolic CHF: Volume status much improved. Looks to be back to baseline. Change back to po diuretics.  2. Paroxysmal Atrial fibrillation with slow VR: Was in AF on admit. Now back in NSR with Wenckebach. He  is on amiodarone at low dose but is thought to be a poor candidate for anticoagulation likely due to fall risk, so he is not anticoagulated.  - Continue low-dose amiodarone for now. No indication for PPM currently. 3. CKD: Stage IV. stable today 4. Elevated troponin: Suspect due to HF. No ischemia.  5. Baseline very poor functional status, wheelchair-bound.he refuses SNF. PT recommends 24 hour supervision and HHPT. Family says the will care for him   Likely home in am.   Benay Spice 1:07 PM  Length of Stay: 2 Advanced Heart Failure Team Pager [phone] (M-F; [place])  Please contact [facility] for night-coverage after hours (4p -7a ) and weekends on [email or web address]

## 2014-03-13 LAB — BASIC METABOLIC PANEL
Anion gap: 7 (ref 5–15)
BUN: 61 mg/dL — ABNORMAL HIGH (ref 6–23)
CO2: 28 mmol/L (ref 19–32)
Calcium: 8.8 mg/dL (ref 8.4–10.5)
Chloride: 106 mmol/L (ref 96–112)
Creatinine, Ser: 2.45 mg/dL — ABNORMAL HIGH (ref 0.50–1.35)
GFR calc Af Amer: 27 mL/min — ABNORMAL LOW (ref >90)
GFR calc non Af Amer: 24 mL/min — ABNORMAL LOW (ref >90)
Glucose, Bld: 129 mg/dL — ABNORMAL HIGH (ref 70–99)
Potassium: 4 mmol/L (ref 3.5–5.1)
Sodium: 141 mmol/L (ref 135–145)

## 2014-03-13 LAB — GLUCOSE, CAPILLARY
GLUCOSE-CAPILLARY: 139 mg/dL — AB (ref 70–99)
Glucose-Capillary: 145 mg/dL — ABNORMAL HIGH (ref 70–99)

## 2014-03-13 MED ORDER — TORSEMIDE 20 MG PO TABS: ORAL_TABLET | ORAL | Status: DC

## 2014-03-13 MED ORDER — ISOSORBIDE MONONITRATE ER 60 MG PO TB24: 60.0000 mg | ORAL_TABLET | Freq: Every day | ORAL | Status: DC

## 2014-03-13 MED ORDER — ASPIRIN 81 MG PO TBEC: 81.0000 mg | DELAYED_RELEASE_TABLET | Freq: Every day | ORAL | Status: AC

## 2014-03-13 NOTE — Discharge Summary (Signed)
Physician Discharge Summary     Cardiologist:  Aundra Dubin Patient ID: [name] MRN: [id number] DOB/AGE: 11/09/1932 81 y.o.  Admit date: 03/10/2014 Discharge date: 03/13/2014  Admission Diagnoses:   Acute on chronic diastolic congestive heart failure  Discharge Diagnoses:  Principal Problem:   Acute on chronic diastolic congestive heart failure Active Problems:   Hypothyroid   Type 2 diabetes mellitus with renal manifestations   Dyslipidemia   Essential hypertension   CAD - moderate 3V CAD April 2012   Paroxysmal atrial fibrillation- in AF 03/10/14   Chronic diastolic heart failure   History of prostate cancer- 15 yrs ago   Second degree AV block, Mobitz type I   Myelodysplastic syndrome-hx of transfusions   Limited mobility- wheel chair for the last year   Chronic renal insufficiency, stage III-IV   Discharged Condition: stable  Hospital Course:   This is a 81 y.o. male with a past medical history significant for chronic diastolic CHF. He has been seen the CHF clinic, last OV Nov 2015. His last echo was Sept 2015- Nl LVF with grade 3 DD. He lives alone and has been basically wheel chair bound for the past year. Other medical problems include PAF (on Amio, not a Coumadin candidate), DM, CRI, CVA, Myelodysplastic syndrome, and moderate 3V CAD. He is admitted now with acute on chronic dyspnea. He is noted to be in CHF by CXR, and BNP. He is also noted to be AF with slow VR (on Amiodarone 100 mg daily).   He was admitted and started on IV lasix 80mg  BID which resulted in net fluid loss of 2.4L.  He was changed to PO torsemide 60QAM and 40QPM, which is his home dosing.  He is on amiodarone at low dose but is thought to be a poor candidate for anticoagulation likely due to fall risk. He develop Wenkebach at one point with 2:1.  No indication for PPM currently. Mild troponin elevation thought to be related to CHF and demand ischemia. CKD: Stage IV. BUN/creatinine are elevated, up  mildly from his baseline which is > 2. Cardiorenal syndrome suspected.   Baseline very poor functional status, wheelchair-bound. He refuses SNF ("I'll never go back there again").  PT recommends 24 hour supervision and HHPT.  Family says the will care for him .  The patient was seen by Dr. Aundra Dubin who felt he was stable for DC home.  Follow up with CHF clinic next week.     Consults: PT, Diabetes [place]. Nutritional.  Significant Diagnostic Studies:  PORTABLE CHEST - 1 VIEW  COMPARISON: 01/27/2014  FINDINGS: Patchy airspace disease again noted throughout the lungs. This is unchanged since prior study. Cardiomegaly. No effusions. No acute bony abnormality.  IMPRESSION: Stable patchy bilateral airspace disease with cardiomegaly. A component of this is chronic lung disease. It is difficult to exclude superimposed edema or infection. Study Conclusions  - Left ventricle: The cavity size was normal. Wall thickness was increased in a pattern of moderate LVH. Systolic function was normal. The estimated ejection fraction was in the range of 55% to 60%. - Mitral valve: There was mild regurgitation. - Left atrium: The atrium was moderately dilated.  Treatments: See above  Discharge Exam: Blood pressure 152/61, pulse 82, temperature 98 F (36.7 C), temperature source Oral, resp. rate 18, weight 235 lb 10.6 oz (106.897 kg), SpO2 100 %.   Disposition: 06-Home-Health Care Svc      Discharge Instructions    Diet - low sodium heart healthy  Complete by:  As directed      Discharge instructions    Complete by:  As directed   Monitor your weight every morning.  If you gain 3 pounds in 24 hours, or 5 pounds in a week, call the office for instructions.     Increase activity slowly    Complete by:  As directed             Medication List    STOP taking these medications        metolazone 2.5 MG tablet  Commonly known as:  ZAROXOLYN     potassium chloride SA 20 MEQ  tablet  Commonly known as:  K-DUR,KLOR-CON      TAKE these medications        allopurinol 100 MG tablet  Commonly known as:  ZYLOPRIM  Take 1 tablet (100 mg total) by mouth at bedtime.     amiodarone 200 MG tablet  Commonly known as:  PACERONE  Take 100 mg by mouth every morning.     amLODipine 10 MG tablet  Commonly known as:  NORVASC  Take 10 mg by mouth daily.     aspirin 81 MG EC tablet  Take 1 tablet (81 mg total) by mouth daily.     atorvastatin 40 MG tablet  Commonly known as:  LIPITOR  TAKE 1 TABLET BY MOUTH EVERY DAY     citalopram 10 MG tablet  Commonly known as:  CELEXA  Take 20 mg by mouth daily.     Fluticasone-Salmeterol 100-50 MCG/DOSE Aepb  Commonly known as:  ADVAIR  Inhale 1 puff into the lungs as needed (for shortness of breath).     glucose blood test strip  Commonly known as:  ONE TOUCH ULTRA TEST  1 each by Other route 3 (three) times daily. Use as directed three times daily to check blood sugar.  Diagnosis code E11.29     hydrALAZINE 100 MG tablet  Commonly known as:  APRESOLINE  Take 100 mg by mouth 3 (three) times daily.     Insulin Detemir 100 UNIT/ML Pen  Commonly known as:  LEVEMIR  Inject 40 Units into the skin 2 (two) times daily.     isosorbide mononitrate 60 MG 24 hr tablet  Commonly known as:  IMDUR  Take 1 tablet (60 mg total) by mouth daily.     levothyroxine 125 MCG tablet  Commonly known as:  SYNTHROID, LEVOTHROID  Take 125 mcg by mouth every morning.     neomycin-bacitracin-polymyxin Oint  Commonly known as:  NEOSPORIN  Apply 1 application topically daily.     omeprazole 20 MG capsule  Commonly known as:  PRILOSEC  Take 20 mg by mouth daily.     onetouch ultrasoft lancets  - Use to help check blood sugar three times a day  - Dx E11.29     silver sulfADIAZINE 1 % cream  Commonly known as:  SILVADENE  - Apply topically daily. Topical, Daily, For 21 days  - Apply to Bilateral LE ulcerations twice daily for three  days (Wednesday through Friday) then decrease dressing changes to daily and continue for 21 days.     torsemide 20 MG tablet  Commonly known as:  DEMADEX  Take 3 tablets (60mg ) in am and 2 tablets (40mg ) in pm     TRIGELS-F FORTE 460-60-0.01-1 MG Caps capsule  Generic drug:  Fe Fum-Vit C-Vit B12-FA  TAKE 1 CAPSULE BY MOUTH DAILY.       Follow-up Information  Follow up with Loralie Champagne, MD.   Specialty:  Cardiology   Why:  The office will call you with the follow up appt date and time.    Contact information:   [address] [place] [id number] [phone]      Greater than 30 minutes was spent completing the patient's discharge.    SignedTarri Fuller, [place] 03/13/2014, 10:20 AM

## 2014-03-13 NOTE — Progress Notes (Signed)
NCM spoke to dtr, [name] to make aware [employer] will be the vendor for DME. Provided contact number for pt's dtr to call to follow up on Monday for DME. Contacted [facility] for referral for [facility] PT/RN. Will do a soc for 03/15/2014. Jonnie Finner RN CCM Case Mgmt phone [phone]

## 2014-03-13 NOTE — Progress Notes (Signed)
Patient ID: [name], male   DOB: [birth year], 81 y.o.   MRN: [id number] Advanced Heart Failure Rounding Note   Subjective:    81 y.o. male with a past medical history significant for chronic diastolic CHF. Other medical problems include PAF (on Amio, not a Coumadin candidate), DM, CRI, CVA, Myelodysplastic syndrome, and moderate 3V CAD.   Admitted 1/20 with acute on chronic HF with marked volume overload and AF with slow VR (on Amiodarone 100 mg daily).   Reasonable diuresis, breathing feels back to baseline.  Now back on po diuretics.  Creatinine relatively stable.  He remains in atrial fibrillation this morning, HR in 70s.     Objective:   Weight Range:  Vital Signs:   Temp:  [98 F (36.7 C)-98.4 F (36.9 C)] 98 F (36.7 C) (01/23 0543) Pulse Rate:  [79-82] 82 (01/23 0543) Resp:  [18-20] 18 (01/23 0543) BP: (146-157)/(61-82) 152/61 mmHg (01/23 0543) SpO2:  [91 %-100 %] 100 % (01/23 0543) Weight:  [235 lb 10.6 oz (106.897 kg)] 235 lb 10.6 oz (106.897 kg) (01/23 0543) Last BM Date: 03/12/14  Weight change: Filed Weights   03/11/14 0444 03/12/14 0500 03/13/14 0543  Weight: 237 lb 10.5 oz (107.8 kg) 237 lb 3.4 oz (107.6 kg) 235 lb 10.6 oz (106.897 kg)    Intake/Output:   Intake/Output Summary (Last 24 hours) at 03/13/14 0914 Last data filed at 03/13/14 0739  Gross per 24 hour  Intake   1560 ml  Output   1850 ml  Net   -290 ml     PHYSICAL EXAM: General appearance: chronically ill appearing sitting in chair NAD.  Neck: no carotid bruit, JVP 8 Lungs: scattered rhonchi Heart: irregularly irregular rhythm, 2/6 SEM RUSB, no S3 Abdomen: obese nondistended, not tender. Good bowel sounds Extremities: chronic skin changes, trace ankle edema.   Pulses: diminnished  Skin: cool and dry Neurologic: Grossly normal.  slurred speech  Telemetry: difficult baseline but appears to be atrial fibrillation  Labs: Basic Metabolic Panel:  Recent Labs Lab 03/10/14 1039  03/11/14 0354 03/12/14 0344 03/13/14 0255  NA 138 140 144 141  K 5.2* 4.1 3.9 4.0  CL 104 106 108 106  CO2 23 27 27 28   GLUCOSE 172* 196* 131* 129*  BUN 70* 65* 60* 61*  CREATININE 2.39* 2.31* 2.29* 2.45*  CALCIUM 9.0 8.7 8.8 8.8    Liver Function Tests:  Recent Labs Lab 03/11/14 0354  AST 19  ALT 17  ALKPHOS 105  BILITOT 0.5  PROT 6.6  ALBUMIN 2.7*   No results for input(s): LIPASE, AMYLASE in the last 168 hours. No results for input(s): AMMONIA in the last 168 hours.  CBC:  Recent Labs Lab 03/10/14 1039 03/11/14 0354  WBC 7.4 7.5  NEUTROABS 6.1  --   HGB 11.2* 10.2*  HCT 36.4* 33.5*  MCV 80.9 82.9  PLT 279 263    Cardiac Enzymes:  Recent Labs Lab 03/10/14 1039  TROPONINI 0.05*    BNP: BNP (last 3 results)  Recent Labs  10/21/13 2012 11/28/13 1215 01/27/14 1709  PROBNP 1541.0* 1597.0* 2020.0*     Other results:    Imaging: No results found.   Medications:     Scheduled Medications: . allopurinol  100 mg Oral QHS  . amiodarone  100 mg Oral Daily  . amLODipine  10 mg Oral Daily  . aspirin  324 mg Oral Once  . aspirin EC  81 mg Oral Daily  . atorvastatin  40 mg  Oral Daily  . citalopram  20 mg Oral Daily  . heparin  5,000 Units Subcutaneous 3 times per day  . hydrALAZINE  100 mg Oral TID  . insulin aspart  0-5 Units Subcutaneous QHS  . insulin aspart  0-9 Units Subcutaneous TID WC  . insulin detemir  20 Units Subcutaneous BID  . isosorbide mononitrate  60 mg Oral Daily  . levothyroxine  125 mcg Oral q morning - 10a  . mometasone-formoterol  2 puff Inhalation BID  . pantoprazole  40 mg Oral Daily  . silver sulfADIAZINE   Topical Daily  . sodium chloride  3 mL Intravenous Q12H  . torsemide  40 mg Oral q1800  . torsemide  60 mg Oral Q breakfast    Infusions:    PRN Medications: sodium chloride, acetaminophen, nitroGLYCERIN, ondansetron (ZOFRAN) IV, sodium chloride   Assessment/Plan:   1. Acute on chronic diastolic  CHF: Volume status much improved. Looks to be back to baseline. Now back on po antibiotics.  2. Paroxysmal Atrial fibrillation with slow VR: Was in AF on admit. Yesterday was in NSR with Wenckebach. Baseline difficult but probably in atrial fibrillation today HR 70s. He is on amiodarone at low dose but is thought to be a poor candidate for anticoagulation likely due to fall risk, so he is not anticoagulated.  - Continue low-dose amiodarone for now. No indication for PPM currently. 3. CKD: Stage IV.Stable today 4. Elevated troponin: Suspect due to HF. No ischemia.  5. Baseline very poor functional status, wheelchair-bound.he refuses SNF ("I'll never go back there again"). PT recommends 24 hour supervision and HHPT. Family says the will care for him  6. I think he is ready for discharge today.  Will need followup in CHF clinic, ideally end of this coming week.  Continue the current cardiac regimen, with diuretic being torsemide 60 qam/40 qpm.  Will need home health and PT.   Dalton McLean,MD 03/13/2014   Length of Stay: 3 Advanced Heart Failure Team Pager [phone] (M-F; [place])  Please contact [facility] for night-coverage after hours (4p -7a ) and weekends on [email or web address]

## 2014-03-14 NOTE — Progress Notes (Signed)
Discharged instructions given to daughter, questions answered. Case management to set up [facility] and order hospital bed. IV and tele removed pt dressed. Wheeled out via wheelchair.

## 2014-03-15 ENCOUNTER — Telehealth: Payer: Self-pay | Admitting: *Deleted

## 2014-03-15 NOTE — Telephone Encounter (Signed)
Pt was on TCM call list was d/c 03/13/14 for chronic diastolic congestive heart failure. Will follow-up with cardiologist Dr. Aundra Dubin...[name]

## 2014-03-17 ENCOUNTER — Ambulatory Visit (HOSPITAL_BASED_OUTPATIENT_CLINIC_OR_DEPARTMENT_OTHER): Payer: Medicare PPO

## 2014-03-17 ENCOUNTER — Other Ambulatory Visit (HOSPITAL_BASED_OUTPATIENT_CLINIC_OR_DEPARTMENT_OTHER): Payer: Medicare PPO

## 2014-03-17 DIAGNOSIS — N183 Chronic kidney disease, stage 3 unspecified: Secondary | ICD-10-CM

## 2014-03-17 DIAGNOSIS — D500 Iron deficiency anemia secondary to blood loss (chronic): Secondary | ICD-10-CM

## 2014-03-17 DIAGNOSIS — N189 Chronic kidney disease, unspecified: Secondary | ICD-10-CM | POA: Diagnosis not present

## 2014-03-17 DIAGNOSIS — D631 Anemia in chronic kidney disease: Secondary | ICD-10-CM

## 2014-03-17 DIAGNOSIS — N039 Chronic nephritic syndrome with unspecified morphologic changes: Secondary | ICD-10-CM

## 2014-03-17 LAB — CBC WITH DIFFERENTIAL/PLATELET
BASO%: 0.1 % (ref 0.0–2.0)
Basophils Absolute: 0 10*3/uL (ref 0.0–0.1)
EOS%: 5.9 % (ref 0.0–7.0)
Eosinophils Absolute: 0.3 10*3/uL (ref 0.0–0.5)
HCT: 33.3 % — ABNORMAL LOW (ref 38.4–49.9)
HEMOGLOBIN: 9.8 g/dL — AB (ref 13.0–17.1)
LYMPH%: 11.3 % — ABNORMAL LOW (ref 14.0–49.0)
MCH: 24.5 pg — ABNORMAL LOW (ref 27.2–33.4)
MCHC: 29.5 g/dL — ABNORMAL LOW (ref 32.0–36.0)
MCV: 83.2 fL (ref 79.3–98.0)
MONO#: 0.6 10*3/uL (ref 0.1–0.9)
MONO%: 9.7 % (ref 0.0–14.0)
NEUT#: 4.3 10*3/uL (ref 1.5–6.5)
NEUT%: 73 % (ref 39.0–75.0)
Platelets: 292 10*3/uL (ref 140–400)
RBC: 4 10*6/uL — AB (ref 4.20–5.82)
RDW: 22.1 % — ABNORMAL HIGH (ref 11.0–14.6)
WBC: 5.8 10*3/uL (ref 4.0–10.3)
lymph#: 0.7 10*3/uL — ABNORMAL LOW (ref 0.9–3.3)

## 2014-03-17 MED ORDER — DARBEPOETIN ALFA 300 MCG/0.6ML IJ SOSY
300.0000 ug | PREFILLED_SYRINGE | Freq: Once | INTRAMUSCULAR | Status: AC
  Administered 2014-03-17: 300 ug via SUBCUTANEOUS
  Filled 2014-03-17: qty 0.6

## 2014-03-18 ENCOUNTER — Telehealth (HOSPITAL_COMMUNITY): Payer: Self-pay | Admitting: Vascular Surgery

## 2014-03-18 LAB — IRON AND TIBC CHCC
%SAT: 12 % — ABNORMAL LOW (ref 20–55)
Iron: 31 ug/dL — ABNORMAL LOW (ref 42–163)
TIBC: 264 ug/dL (ref 202–409)
UIBC: 233 ug/dL (ref 117–376)

## 2014-03-18 LAB — FERRITIN CHCC: Ferritin: 92 ng/ml (ref 22–316)

## 2014-03-18 NOTE — Telephone Encounter (Signed)
[facility] called... Pt 02 stats resting 90% walking 20 feet decrease to low 80s then she put 02 back on , it been running low to mid 90s resting please advise

## 2014-03-18 NOTE — Telephone Encounter (Signed)
Left message to call back  

## 2014-03-19 ENCOUNTER — Other Ambulatory Visit (HOSPITAL_COMMUNITY): Payer: Self-pay | Admitting: Cardiology

## 2014-03-19 MED ORDER — AMIODARONE HCL 200 MG PO TABS: 100.0000 mg | ORAL_TABLET | ORAL | Status: DC

## 2014-03-22 ENCOUNTER — Telehealth (HOSPITAL_COMMUNITY): Payer: Self-pay | Admitting: Vascular Surgery

## 2014-03-22 NOTE — Telephone Encounter (Signed)
Nurse called pt cant weight because equipment hasnt arrived, no SOB, no edema, 02 stats 97 hearing crackles in lower lobe , productive cough.Marland Kitchen FYI

## 2014-03-23 ENCOUNTER — Other Ambulatory Visit (HOSPITAL_COMMUNITY): Payer: Self-pay | Admitting: *Deleted

## 2014-03-23 MED ORDER — CLONIDINE HCL 0.1 MG/24HR TD PTWK: 0.1000 mg | MEDICATED_PATCH | TRANSDERMAL | Status: DC

## 2014-03-23 NOTE — Telephone Encounter (Signed)
Spoke w/Donita with [facility] pt is doing well now

## 2014-03-23 NOTE — Telephone Encounter (Signed)
Received request from pharmacy to refill pt's clonidine patch, med is no longer on med list but am not sure why, there is no MD note stating it was to be discharged, discussed w/Dr Bensimhon, he feels pt should still be on patch, refill sent in per his request

## 2014-03-24 ENCOUNTER — Encounter (HOSPITAL_COMMUNITY): Payer: Medicare PPO

## 2014-03-25 ENCOUNTER — Ambulatory Visit (HOSPITAL_COMMUNITY)
Admission: RE | Admit: 2014-03-25 | Discharge: 2014-03-25 | Disposition: A | Discharge status: Home / Self Care | Payer: Medicare PPO | Source: Ambulatory Visit | Attending: Internal Medicine | Admitting: Internal Medicine

## 2014-03-25 ENCOUNTER — Encounter (HOSPITAL_COMMUNITY): Payer: Self-pay

## 2014-03-25 VITALS — BP 158/67 | HR 66 | Resp 22 | Wt 250.0 lb

## 2014-03-25 DIAGNOSIS — I5032 Chronic diastolic (congestive) heart failure: Secondary | ICD-10-CM | POA: Diagnosis not present

## 2014-03-25 DIAGNOSIS — N183 Chronic kidney disease, stage 3 (moderate): Secondary | ICD-10-CM | POA: Insufficient documentation

## 2014-03-25 DIAGNOSIS — Z79899 Other long term (current) drug therapy: Secondary | ICD-10-CM | POA: Insufficient documentation

## 2014-03-25 DIAGNOSIS — I480 Paroxysmal atrial fibrillation: Secondary | ICD-10-CM | POA: Diagnosis not present

## 2014-03-25 DIAGNOSIS — G4733 Obstructive sleep apnea (adult) (pediatric): Secondary | ICD-10-CM | POA: Insufficient documentation

## 2014-03-25 DIAGNOSIS — I441 Atrioventricular block, second degree: Secondary | ICD-10-CM | POA: Insufficient documentation

## 2014-03-25 DIAGNOSIS — Z794 Long term (current) use of insulin: Secondary | ICD-10-CM | POA: Diagnosis not present

## 2014-03-25 DIAGNOSIS — I129 Hypertensive chronic kidney disease with stage 1 through stage 4 chronic kidney disease, or unspecified chronic kidney disease: Secondary | ICD-10-CM | POA: Insufficient documentation

## 2014-03-25 DIAGNOSIS — E785 Hyperlipidemia, unspecified: Secondary | ICD-10-CM | POA: Diagnosis not present

## 2014-03-25 DIAGNOSIS — E119 Type 2 diabetes mellitus without complications: Secondary | ICD-10-CM | POA: Diagnosis not present

## 2014-03-25 DIAGNOSIS — E039 Hypothyroidism, unspecified: Secondary | ICD-10-CM | POA: Diagnosis not present

## 2014-03-25 DIAGNOSIS — Z7982 Long term (current) use of aspirin: Secondary | ICD-10-CM | POA: Insufficient documentation

## 2014-03-25 DIAGNOSIS — J449 Chronic obstructive pulmonary disease, unspecified: Secondary | ICD-10-CM | POA: Insufficient documentation

## 2014-03-25 MED ORDER — TORSEMIDE 20 MG PO TABS: 60.0000 mg | ORAL_TABLET | Freq: Two times a day (BID) | ORAL | Status: DC

## 2014-03-25 MED ORDER — POTASSIUM CHLORIDE CRYS ER 20 MEQ PO TBCR: 20.0000 meq | EXTENDED_RELEASE_TABLET | Freq: Two times a day (BID) | ORAL | Status: DC

## 2014-03-25 NOTE — Progress Notes (Signed)
Patient ID: [name], male   DOB: 17-May-1932, 81 y.o.   MRN: [id number]   Oncologist: Dr [name]   HPI: [name] is a very pleasant 81 year old male with a history of diastolic heart failure as well as 2nd degree heart block (Wenckebach), hypertension, hyperlipidemia, diabetes, previous CVA, and paroxysmal atrial fibrillation on amiodarone. Not on coumadin as felt to be high risk of bleeding due to h/o ETOH. Echo 3/11 EF 60-65% mild MR. Also has history of prostate cancer with prostatectomy 15 year ago and now with lesion on spine not thought to be from cancer.     Admitted to [facility] with volume overload 9/2 through 10/26/13 in the setting of recent transfusions. Diuresed with IV lasix. Discharge weigh was 238 pounds.   Admitted to [facility] 10/10 through 11/30/13 with increased dyspnea. Treated for HCAP and mild volume overload. Diuresed with IV lasix and given antibiotics. Discharge weight was 232 pounds.   [name] returns for post hospital follow up. Admitted to [facility] the end of  January with volume overload. His discharge weight was 235 pounds.  Diuresed with IV lasix and transitioned to torsemide 60 mg in am 40 mg in pm. Says [name] has been drinking lots of fluids and eating hight salt foods such as hot dogs, can spaghetti, and can ravioli. Not weighing at home due to balance issues. Uses motorized wheelchair. Followed by [facility]. Daughter prepares pill box. Has Nurse Aide daily to assist with meals and ADLs.    Labs 10/26/13 Creatinine 2.07 K 3.5  Labs 11/30/13 K 4.1 Creatinine 2.31   ROS: All systems negative except as listed in HPI, PMH and Problem List.  Past Medical History  Diagnosis Date  . Chronic diastolic heart failure     secondary to diastolic dysfunction EF (previous EF 35-45%)ef 60%4/09  . Arrhythmia     atrial fibrillation /pt on amiodarone,thought to be poor coumadin  . Stroke   . Other and unspecified hyperlipidemia   . Hypertension   . Hypertrophy of prostate without urinary  obstruction and other lower urinary tract symptoms (LUTS)   . Osteoarthrosis, unspecified whether generalized or localized, unspecified site   . Type II or unspecified type diabetes mellitus without mention of complication, not stated as uncontrolled   . AV block, Mobitz 1     Intolerance to ACE's / discontiuation of beta blockers  . Obstructive sleep apnea   . Iron deficiency anemia, unspecified     s/p EGD and coloscopy 3/10.gastritis.hemorrhids  . Cerebrovascular accident   . Renal insufficiency   . Obesity   . Allergic rhinitis, cause unspecified 05/29/2010  . CAD (coronary artery disease)     cath 12/04: mLAD 50-60%, mCFX 20-30%, pRCA 50-60%, mRCA 40%  . Normochromic normocytic anemia 04/21/2013  . Abnormal CXR 04/21/2013  . Hypertensive kidney disease with CKD stage III 04/21/2013  . Prostate cancer   . CHF (congestive heart failure)   . COPD (chronic obstructive pulmonary disease)   . Shortness of breath dyspnea   . Pneumonia     Current Outpatient Prescriptions  Medication Sig Dispense Refill  . allopurinol (ZYLOPRIM) 100 MG tablet Take 1 tablet (100 mg total) by mouth at bedtime. 30 tablet 11  . amiodarone (PACERONE) 200 MG tablet Take 0.5 tablets (100 mg total) by mouth every morning. 45 tablet 3  . amLODipine (NORVASC) 10 MG tablet Take 10 mg by mouth daily.    Marland Kitchen aspirin EC 81 MG EC tablet Take 1 tablet (81 mg total)  by mouth daily.    Marland Kitchen atorvastatin (LIPITOR) 40 MG tablet TAKE 1 TABLET BY MOUTH EVERY DAY 30 tablet 6  . citalopram (CELEXA) 10 MG tablet Take 20 mg by mouth daily.     . cloNIDine (CATAPRES - DOSED IN MG/24 HR) 0.1 mg/24hr patch Place 1 patch (0.1 mg total) onto the skin once a week. 4 patch 12  . Fluticasone-Salmeterol (ADVAIR) 100-50 MCG/DOSE AEPB Inhale 1 puff into the lungs as needed (for shortness of breath).     Marland Kitchen glucose blood (ONE TOUCH ULTRA TEST) test strip 1 each by Other route 3 (three) times daily. Use as directed three times daily to check blood sugar.   Diagnosis code E11.29 100 each 5  . hydrALAZINE (APRESOLINE) 100 MG tablet Take 100 mg by mouth 3 (three) times daily.  6  . Insulin Detemir (LEVEMIR) 100 UNIT/ML Pen Inject 40 Units into the skin 2 (two) times daily.    . isosorbide mononitrate (IMDUR) 60 MG 24 hr tablet Take 1 tablet (60 mg total) by mouth daily. 30 tablet 5  . Lancets (ONETOUCH ULTRASOFT) lancets Use to help check blood sugar three times a day Dx E11.29 100 each 5  . levothyroxine (SYNTHROID, LEVOTHROID) 125 MCG tablet Take 125 mcg by mouth every morning.  11  . neomycin-bacitracin-polymyxin (NEOSPORIN) OINT Apply 1 application topically daily.    Marland Kitchen omeprazole (PRILOSEC) 20 MG capsule Take 20 mg by mouth daily.    . silver sulfADIAZINE (SILVADENE) 1 % cream Apply topically daily. Topical, Daily, For 21 days Apply to Bilateral LE ulcerations twice daily for three days (Wednesday through Friday) then decrease dressing changes to daily and continue for 21 days. 50 g 0  . torsemide (DEMADEX) 20 MG tablet Take 3 tablets (60mg ) in am and 2 tablets (40mg ) in pm 150 tablet 3  . TRIGELS-F FORTE 460-60-0.01-1 MG CAPS capsule TAKE 1 CAPSULE BY MOUTH DAILY. 30 capsule 5   No current facility-administered medications for this encounter.     PHYSICAL EXAM: Filed Vitals:   03/25/14 1501  BP: 158/67  Pulse: 66  Resp: 22  Weight: 250 lb (113.399 kg)  SpO2: 81%    General:  Chronically illl appearing. No resp difficulty. Arrived in wheelchair with his daughter.  HEENT: normal Neck: supple. JVP jaw. Carotids 2+ bilaterally; no bruits. No lymphadenopathy or thryomegaly appreciated. Cor: PMI normal. irregualr rate & rhythm. 2/6 TR Lungs: LLL crackles.  Abdomen: obese, soft, nontender, nondistended. No hepatosplenomegaly. No bruits or masses. Good bowel sounds. Extremities: no cyanosis, clubbing, rash,  R and LLE 2-3+ edema   Neuro: alert & orientedx3, cranial nerves grossly intact. Moves all 4 extremities w/o difficulty. Affect  pleasant.   ASSESSMENT & PLAN: 1. Acute/Chronic Diastolic Heart Failure . NYHA III. 10/2013 ECHO normal EF Grade III DD. Volume status elevated likely due to dietary noncompliance. Weight up 15 pounds from most recent discharge. Will ask [facility] to 80 mg IV lasix in am. Give 2.5 mg metolazone daily for 3 days.  Increase torsemide dose to 60 mg twice a day.     Reinforced following low salt diet and limiting fluid intake to < 2 liters per day.    Check BMET tomorrow by [facility]   Reinforced daily weights, low salt food choices, and limiting fluid intake to <2 liters per day.  2. PAF- [place] VASC Score 8. Controlled rate.  Continue amiodarone 100 mg daily not on anticoagulant due to noncompliance.  3. CKD - baseline 1.7-1.9 - Check BMET  with [facility] 4. Hypothyroidism - Per PCP Dr Jenny Reichmann  5. Prostate Cancer - S/P Prostatectomy 15 years ago with questionable lesion on spine  . Followed by Dr [name]    Follow up next week to reassess volume status. Check BMET at that time.  CLEGG,AMY,NP-C  3:18 PM   Patient seen and examined with Darrick Grinder, NP. We discussed all aspects of the encounter. I agree with the assessment and plan as stated above.   [name] has recurrent volume overload in setting of dietary indiscretion. Agree with IV lasix and 3 days of metolazone. Increase torsemide. Long talk on need to watch dietary intake.   Daniel Bensimhon,MD 4:18 PM

## 2014-03-25 NOTE — Patient Instructions (Signed)
INCREASE Torsemide to 60mg  twice daily. 3 tablets in am and 3 tablets in pm.  INCREASE Potassium to 20mg  twice daily. 1 tablet in am 1 tablet in pm.  Take 2.5mg  metolazone (1 tablet) the next 3 mornings.  Will arrange [place] to do IV lasix in the home tomorrow with lab work.  Follow up 1 WEEK.  Do the following things EVERYDAY: 1) Weigh yourself in the morning before breakfast. Write it down and keep it in a log. 2) Take your medicines as prescribed 3) Eat low salt foods-Limit salt (sodium) to 2000 mg per day.  4) Stay as active as you can everyday 5) Limit all fluids for the day to less than 2 liters

## 2014-03-31 ENCOUNTER — Telehealth: Payer: Self-pay | Admitting: Internal Medicine

## 2014-03-31 ENCOUNTER — Ambulatory Visit (HOSPITAL_COMMUNITY)
Admission: RE | Admit: 2014-03-31 | Discharge: 2014-03-31 | Disposition: A | Discharge status: Home / Self Care | Payer: Medicare PPO | Source: Ambulatory Visit | Attending: Adult Health | Admitting: Adult Health

## 2014-03-31 VITALS — BP 117/52 | HR 57 | Wt 229.8 lb

## 2014-03-31 DIAGNOSIS — I480 Paroxysmal atrial fibrillation: Secondary | ICD-10-CM | POA: Diagnosis not present

## 2014-03-31 DIAGNOSIS — N183 Chronic kidney disease, stage 3 unspecified: Secondary | ICD-10-CM

## 2014-03-31 DIAGNOSIS — I5033 Acute on chronic diastolic (congestive) heart failure: Secondary | ICD-10-CM | POA: Diagnosis present

## 2014-03-31 DIAGNOSIS — E039 Hypothyroidism, unspecified: Secondary | ICD-10-CM | POA: Insufficient documentation

## 2014-03-31 DIAGNOSIS — N189 Chronic kidney disease, unspecified: Secondary | ICD-10-CM

## 2014-03-31 DIAGNOSIS — I5032 Chronic diastolic (congestive) heart failure: Secondary | ICD-10-CM

## 2014-03-31 DIAGNOSIS — C61 Malignant neoplasm of prostate: Secondary | ICD-10-CM | POA: Diagnosis not present

## 2014-03-31 LAB — BASIC METABOLIC PANEL
Anion gap: 12 (ref 5–15)
BUN: 88 mg/dL — ABNORMAL HIGH (ref 6–23)
CHLORIDE: 100 mmol/L (ref 96–112)
CO2: 24 mmol/L (ref 19–32)
CREATININE: 2.91 mg/dL — AB (ref 0.50–1.35)
Calcium: 8.9 mg/dL (ref 8.4–10.5)
GFR calc non Af Amer: 19 mL/min — ABNORMAL LOW (ref >90)
GFR, EST AFRICAN AMERICAN: 22 mL/min — AB (ref >90)
Glucose, Bld: 142 mg/dL — ABNORMAL HIGH (ref 70–99)
Potassium: 3.8 mmol/L (ref 3.5–5.1)
Sodium: 136 mmol/L (ref 135–145)

## 2014-03-31 NOTE — Progress Notes (Signed)
Patient ID: DAX MURGUIA, male   DOB: 12-16-1932, 81 y.o.   MRN: [id number] Patient ID: SHAUGHN THOMLEY, male   DOB: 03/07/1932, 81 y.o.   MRN: [id number]   Oncologist: Dr [name]   HPI: Mr. Farrington is a very pleasant 81 year old male with a history of diastolic heart failure as well as 2nd degree heart block (Wenckebach), hypertension, hyperlipidemia, diabetes, previous CVA, and paroxysmal atrial fibrillation on amiodarone. Not on coumadin as felt to be high risk of bleeding due to h/o ETOH. Echo 3/11 EF 60-65% mild MR. Also has history of prostate cancer with prostatectomy 15 year ago and now with lesion on spine not thought to be from cancer.     Admitted to [facility] with volume overload 9/2 through 10/26/13 in the setting of recent transfusions. Diuresed with IV lasix. Discharge weigh was 238 pounds.   Admitted to [facility] 10/10 through 11/30/13 with increased dyspnea. Treated for HCAP and mild volume overload. Diuresed with IV lasix and given antibiotics. Discharge weight was 232 pounds.   He returns for  follow up. Last visit he was given metolazone 2.5 mg for 3 days and torsemide was increased to 60 mg twice a day. Weight at home down to  225 pounds. Taking all medications. Following low salt diet.  Uses motorized wheelchair. Followed by [facility]. Daughter prepares pill box. Has Nurse Aide daily to assist with meals and ADLs.    Labs 10/26/13 Creatinine 2.07 K 3.5  Labs 11/30/13 K 4.1 Creatinine 2.31   ROS: All systems negative except as listed in HPI, PMH and Problem List.  Past Medical History  Diagnosis Date  . Chronic diastolic heart failure     secondary to diastolic dysfunction EF (previous EF 35-45%)ef 60%4/09  . Arrhythmia     atrial fibrillation /pt on amiodarone,thought to be poor coumadin  . Stroke   . Other and unspecified hyperlipidemia   . Hypertension   . Hypertrophy of prostate without urinary obstruction and other lower urinary tract symptoms (LUTS)   . Osteoarthrosis, unspecified  whether generalized or localized, unspecified site   . Type II or unspecified type diabetes mellitus without mention of complication, not stated as uncontrolled   . AV block, Mobitz 1     Intolerance to ACE's / discontiuation of beta blockers  . Obstructive sleep apnea   . Iron deficiency anemia, unspecified     s/p EGD and coloscopy 3/10.gastritis.hemorrhids  . Cerebrovascular accident   . Renal insufficiency   . Obesity   . Allergic rhinitis, cause unspecified 05/29/2010  . CAD (coronary artery disease)     cath 12/04: mLAD 50-60%, mCFX 20-30%, pRCA 50-60%, mRCA 40%  . Normochromic normocytic anemia 04/21/2013  . Abnormal CXR 04/21/2013  . Hypertensive kidney disease with CKD stage III 04/21/2013  . Prostate cancer   . CHF (congestive heart failure)   . COPD (chronic obstructive pulmonary disease)   . Shortness of breath dyspnea   . Pneumonia     Current Outpatient Prescriptions  Medication Sig Dispense Refill  . allopurinol (ZYLOPRIM) 100 MG tablet Take 1 tablet (100 mg total) by mouth at bedtime. 30 tablet 11  . amiodarone (PACERONE) 200 MG tablet Take 0.5 tablets (100 mg total) by mouth every morning. 45 tablet 3  . amLODipine (NORVASC) 10 MG tablet Take 10 mg by mouth daily.    Marland Kitchen aspirin EC 81 MG EC tablet Take 1 tablet (81 mg total) by mouth daily.    Marland Kitchen atorvastatin (LIPITOR) 40 MG tablet TAKE  1 TABLET BY MOUTH EVERY DAY 30 tablet 6  . citalopram (CELEXA) 10 MG tablet Take 20 mg by mouth daily.     . cloNIDine (CATAPRES - DOSED IN MG/24 HR) 0.1 mg/24hr patch Place 1 patch (0.1 mg total) onto the skin once a week. 4 patch 12  . Fluticasone-Salmeterol (ADVAIR) 100-50 MCG/DOSE AEPB Inhale 1 puff into the lungs as needed (for shortness of breath).     Marland Kitchen glucose blood (ONE TOUCH ULTRA TEST) test strip 1 each by Other route 3 (three) times daily. Use as directed three times daily to check blood sugar.  Diagnosis code E11.29 100 each 5  . hydrALAZINE (APRESOLINE) 100 MG tablet Take 100 mg  by mouth 3 (three) times daily.  6  . Insulin Detemir (LEVEMIR) 100 UNIT/ML Pen Inject 40 Units into the skin 2 (two) times daily.    . isosorbide mononitrate (IMDUR) 60 MG 24 hr tablet Take 1 tablet (60 mg total) by mouth daily. 30 tablet 5  . Lancets (ONETOUCH ULTRASOFT) lancets Use to help check blood sugar three times a day Dx E11.29 100 each 5  . levothyroxine (SYNTHROID, LEVOTHROID) 125 MCG tablet Take 125 mcg by mouth every morning.  11  . neomycin-bacitracin-polymyxin (NEOSPORIN) OINT Apply 1 application topically daily.    Marland Kitchen omeprazole (PRILOSEC) 20 MG capsule Take 20 mg by mouth daily.    . potassium chloride SA (K-DUR,KLOR-CON) 20 MEQ tablet Take 1 tablet (20 mEq total) by mouth 2 (two) times daily. 180 tablet 3  . silver sulfADIAZINE (SILVADENE) 1 % cream Apply topically daily. Topical, Daily, For 21 days Apply to Bilateral LE ulcerations twice daily for three days (Wednesday through Friday) then decrease dressing changes to daily and continue for 21 days. 50 g 0  . torsemide (DEMADEX) 20 MG tablet Take 3 tablets (60 mg total) by mouth 2 (two) times daily. 180 tablet 3  . TRIGELS-F FORTE 460-60-0.01-1 MG CAPS capsule TAKE 1 CAPSULE BY MOUTH DAILY. 30 capsule 5   No current facility-administered medications for this encounter.     PHYSICAL EXAM: Filed Vitals:   03/31/14 1206  BP: 117/52  Pulse: 57  Weight: 229 lb 12 oz (104.214 kg)  SpO2: 93%    General:  Chronically illl appearing. No resp difficulty. Arrived in wheelchair with his daughter.  HEENT: normal Neck: supple. JVP 6-7 Carotids 2+ bilaterally; no bruits. No lymphadenopathy or thryomegaly appreciated. Cor: PMI normal. irregualr rate & rhythm. 2/6 TR Lungs: Clear.  Abdomen: obese, soft, nontender, nondistended. No hepatosplenomegaly. No bruits or masses. Good bowel sounds. Extremities: no cyanosis, clubbing, rash,  No edema.    Neuro: alert & orientedx3, cranial nerves grossly intact. Moves all 4 extremities w/o  difficulty. Affect pleasant.   ASSESSMENT & PLAN: 1. Acute/Chronic Diastolic Heart Failure . NYHA III. 10/2013 ECHO normal EF Grade III DD. Much improved. Volume status improved. Appears euvolemic. Weight down 20 pounds from last week. Continue current dose to torsemide.  Check BMET today.  Reinforced daily weights, low salt food choices, and limiting fluid intake to <2 liters per day.  2. PAF- [place] VASC Score 8. Controlled rate.  Continue amiodarone 100 mg daily not on anticoagulant due to noncompliance.  3. CKD -Stage IIIbaseline 1.7-1.9 - Check BMET 4. Hypothyroidism - Per PCP Dr Jenny Reichmann  5. Prostate Cancer - S/P Prostatectomy 15 years ago with questionable lesion on spine  . Followed by Dr [name]    Follow up in 4 weeks. Check BMET --> K 3.8 Creatinine  2.60   Will ask him to hold diuretics today and tomorrow then resume torsemide 60 mg twice a day.  Houston Zapien,NP-C  12:16 PM

## 2014-03-31 NOTE — Telephone Encounter (Signed)
donita is calling to report that patient's blood sugar is >200.   He also has sores on his legs that have opened. She is asking if it is ok to use vasoline gauze.

## 2014-03-31 NOTE — Patient Instructions (Signed)
Labs today  Your physician recommends that you schedule a follow-up appointment in: 4 weeks   

## 2014-03-31 NOTE — Telephone Encounter (Signed)
Yes, ok 

## 2014-04-01 NOTE — Telephone Encounter (Signed)
Verbal order left on [name]'s voicemail

## 2014-04-02 ENCOUNTER — Telehealth (HOSPITAL_COMMUNITY): Payer: Self-pay | Admitting: Vascular Surgery

## 2014-04-02 NOTE — Telephone Encounter (Signed)
Patient was seen in our clinic 2 days ago, Amy Clegg NP-C who saw patient advised him to hold his diuretics x 2 days for Creatinine 2.91.  Spoke with wife who states he started 2 day hold on diuretics yesterday morning.  No noticeable swelling, not short of breath, and feels fine.  Advised to make sure he restarts at prescribes tomorrow morning.  Aware and agreeable.  Renee Pain

## 2014-04-02 NOTE — Telephone Encounter (Signed)
Nurse from [facility] called PT weight has been running 225 to 227 lbs

## 2014-04-05 ENCOUNTER — Telehealth: Payer: Self-pay | Admitting: Internal Medicine

## 2014-04-05 ENCOUNTER — Other Ambulatory Visit (HOSPITAL_COMMUNITY): Payer: Self-pay | Admitting: *Deleted

## 2014-04-05 MED ORDER — OMEPRAZOLE 20 MG PO CPDR: 20.0000 mg | DELAYED_RELEASE_CAPSULE | Freq: Every day | ORAL | Status: DC

## 2014-04-05 NOTE — Telephone Encounter (Signed)
Yesterday morning before breakfast patient blood sugar was 288 3 evening prior to yesterday before dinner was 345, 294. 327

## 2014-04-06 MED ORDER — INSULIN DETEMIR 100 UNIT/ML FLEXPEN: 45.0000 [IU] | PEN_INJECTOR | Freq: Two times a day (BID) | SUBCUTANEOUS | Status: DC

## 2014-04-06 NOTE — Telephone Encounter (Signed)
Ok to increase the levemir to 45 untis bid    I have adjusted med list, ok for routine refill if needed

## 2014-04-06 NOTE — Telephone Encounter (Signed)
Left message on [place], RN's voicemail with Dr Gwynn Burly instructions

## 2014-04-07 ENCOUNTER — Other Ambulatory Visit (HOSPITAL_BASED_OUTPATIENT_CLINIC_OR_DEPARTMENT_OTHER): Payer: Medicare PPO

## 2014-04-07 ENCOUNTER — Encounter: Payer: Self-pay | Admitting: Physician Assistant

## 2014-04-07 ENCOUNTER — Ambulatory Visit: Payer: Medicare PPO

## 2014-04-07 ENCOUNTER — Other Ambulatory Visit: Payer: Medicare PPO

## 2014-04-07 ENCOUNTER — Ambulatory Visit (HOSPITAL_BASED_OUTPATIENT_CLINIC_OR_DEPARTMENT_OTHER): Payer: Medicare PPO | Admitting: Physician Assistant

## 2014-04-07 ENCOUNTER — Telehealth: Payer: Self-pay | Admitting: Physician Assistant

## 2014-04-07 VITALS — BP 159/58 | HR 84 | Temp 98.4°F | Resp 18 | Ht 70.0 in | Wt 235.0 lb

## 2014-04-07 DIAGNOSIS — M899 Disorder of bone, unspecified: Secondary | ICD-10-CM

## 2014-04-07 DIAGNOSIS — N183 Chronic kidney disease, stage 3 unspecified: Secondary | ICD-10-CM

## 2014-04-07 DIAGNOSIS — D631 Anemia in chronic kidney disease: Secondary | ICD-10-CM

## 2014-04-07 DIAGNOSIS — D649 Anemia, unspecified: Secondary | ICD-10-CM

## 2014-04-07 DIAGNOSIS — N189 Chronic kidney disease, unspecified: Secondary | ICD-10-CM

## 2014-04-07 DIAGNOSIS — D469 Myelodysplastic syndrome, unspecified: Secondary | ICD-10-CM

## 2014-04-07 LAB — CBC WITH DIFFERENTIAL/PLATELET
BASO%: 0.3 % (ref 0.0–2.0)
Basophils Absolute: 0 10*3/uL (ref 0.0–0.1)
EOS ABS: 0.3 10*3/uL (ref 0.0–0.5)
EOS%: 4.2 % (ref 0.0–7.0)
HEMATOCRIT: 36.4 % — AB (ref 38.4–49.9)
HEMOGLOBIN: 11.2 g/dL — AB (ref 13.0–17.1)
LYMPH%: 10.4 % — AB (ref 14.0–49.0)
MCH: 26 pg — ABNORMAL LOW (ref 27.2–33.4)
MCHC: 30.8 g/dL — ABNORMAL LOW (ref 32.0–36.0)
MCV: 84.7 fL (ref 79.3–98.0)
MONO#: 0.4 10*3/uL (ref 0.1–0.9)
MONO%: 6.4 % (ref 0.0–14.0)
NEUT%: 78.7 % — ABNORMAL HIGH (ref 39.0–75.0)
NEUTROS ABS: 5.1 10*3/uL (ref 1.5–6.5)
PLATELETS: 298 10*3/uL (ref 140–400)
RBC: 4.3 10*6/uL (ref 4.20–5.82)
RDW: 19.8 % — ABNORMAL HIGH (ref 11.0–14.6)
WBC: 6.5 10*3/uL (ref 4.0–10.3)
lymph#: 0.7 10*3/uL — ABNORMAL LOW (ref 0.9–3.3)

## 2014-04-07 MED ORDER — DARBEPOETIN ALFA 300 MCG/0.6ML IJ SOSY: 300.0000 ug | PREFILLED_SYRINGE | Freq: Once | INTRAMUSCULAR | Status: DC

## 2014-04-07 NOTE — Progress Notes (Signed)
Hematology and Oncology Follow Up Visit  [name] [id number] [birth year] 81 y.o. 04/07/2014 4:34 PM [name], MDJohn, Hunt Oris, MD   Principle Diagnosis: 82 year old gentleman with multifactorial anemia likely related to myelodysplastic syndrome. He also has an element of anemia of chronic disease as well as iron deficiency anemia. This was diagnosed in March of 2015. He also has questionable sclerotic bone lesions that were biopsied without any specific diagnosis made. He has a remote history of prostate cancer.  Current therapy: Supportive transfusions  as needed as well Aranesp 300 mcg every 3 weeks.  Interim History:  [name] presents today for a followup visit with his daughter.  He is not reporting any chest pain or shortness of breath. He is able to ambulate with the help of a Travis but for the most part his wheelchair bound. He still attends activities at the [facility] on a regular basis. He has not had any falls or illnesses. He has not reported any complications from Aranesp . He has not required transfusions for at least the last 4 months. He is not reporting any difficulty breathing. He is not reporting any back pain shoulder pain or any bone pain at this time. He had not reported any hospitalizations or illnesses. Has not reported any headaches or blurry vision or double vision. Has not reported any nausea or vomiting or abdominal pain. Has not reported any frequency urgency or hesitancy. Rest or view of system is unremarkable.  Medications: I have reviewed the patient's current medications.  Current Outpatient Prescriptions  Medication Sig Dispense Refill  . allopurinol (ZYLOPRIM) 100 MG tablet Take 1 tablet (100 mg total) by mouth at bedtime. 30 tablet 11  . amiodarone (PACERONE) 200 MG tablet Take 0.5 tablets (100 mg total) by mouth every morning. 45 tablet 3  . amLODipine (NORVASC) 10 MG tablet Take 10 mg by mouth daily.    Marland Kitchen aspirin EC 81 MG EC tablet Take 1 tablet  (81 mg total) by mouth daily.    Marland Kitchen atorvastatin (LIPITOR) 40 MG tablet TAKE 1 TABLET BY MOUTH EVERY DAY 30 tablet 6  . B-D ULTRAFINE III SHORT PEN 31G X 8 MM MISC   3  . citalopram (CELEXA) 10 MG tablet Take 20 mg by mouth daily.     . cloNIDine (CATAPRES - DOSED IN MG/24 HR) 0.1 mg/24hr patch Place 1 patch (0.1 mg total) onto the skin once a week. 4 patch 12  . Fluticasone-Salmeterol (ADVAIR) 100-50 MCG/DOSE AEPB Inhale 1 puff into the lungs as needed (for shortness of breath).     Marland Kitchen glucose blood (ONE TOUCH ULTRA TEST) test strip 1 each by Other route 3 (three) times daily. Use as directed three times daily to check blood sugar.  Diagnosis code E11.29 100 each 5  . hydrALAZINE (APRESOLINE) 100 MG tablet Take 100 mg by mouth 3 (three) times daily.  6  . Insulin Detemir (LEVEMIR) 100 UNIT/ML Pen Inject 45 Units into the skin 2 (two) times daily. 15 mL   . isosorbide mononitrate (IMDUR) 60 MG 24 hr tablet Take 1 tablet (60 mg total) by mouth daily. 30 tablet 5  . levothyroxine (SYNTHROID, LEVOTHROID) 125 MCG tablet Take 125 mcg by mouth every morning.  11  . neomycin-bacitracin-polymyxin (NEOSPORIN) OINT Apply 1 application topically daily.    Marland Kitchen omeprazole (PRILOSEC) 20 MG capsule Take 1 capsule (20 mg total) by mouth daily. 30 capsule 6  . potassium chloride SA (K-DUR,KLOR-CON) 20 MEQ tablet Take 1 tablet (  20 mEq total) by mouth 2 (two) times daily. 180 tablet 3  . silver sulfADIAZINE (SILVADENE) 1 % cream Apply topically daily. Topical, Daily, For 21 days Apply to Bilateral LE ulcerations twice daily for three days (Wednesday through Friday) then decrease dressing changes to daily and continue for 21 days. 50 g 0  . torsemide (DEMADEX) 20 MG tablet Take 3 tablets (60 mg total) by mouth 2 (two) times daily. 180 tablet 3  . TRIGELS-F FORTE 460-60-0.01-1 MG CAPS capsule TAKE 1 CAPSULE BY MOUTH DAILY. 30 capsule 5   No current facility-administered medications for this visit.     Allergies:   Allergies  Allergen Reactions  . Ace Inhibitors Other (See Comments)    HYPOTENSION  . Beta Adrenergic Blockers Other (See Comments)     use cautiously secondary to 2nd degree heart block, hypotension    Past Medical History, Surgical history, Social history, and Family History were reviewed and updated.   Physical Exam: Blood pressure 159/58, pulse 84, temperature 98.4 F (36.9 C), temperature source Oral, resp. rate 18, height 5' 10"  (1.778 m), weight 235 lb (106.595 kg), SpO2 92 %. ECOG: 2 General appearance: alert and cooperative not in any distress. Head: Normocephalic, without obvious abnormality Neck: no adenopathy Lymph nodes: Cervical, supraclavicular, and axillary nodes normal. Heart:regular rate and rhythm, S1, S2 normal, no murmur, click, rub or gallop Lung:chest clear, no wheezing, rales, normal symmetric air entry Abdomin: soft, non-tender, without masses or organomegaly EXT: 1+ edema bilateral lower extremities noted.   Lab Results: Lab Results  Component Value Date   WBC 6.5 04/07/2014   HGB 11.2* 04/07/2014   HCT 36.4* 04/07/2014   MCV 84.7 04/07/2014   PLT 298 04/07/2014     Chemistry      Component Value Date/Time   NA 136 03/31/2014 1222   NA 142 10/21/2013 1034   K 3.8 03/31/2014 1222   K 4.1 10/21/2013 1034   CL 100 03/31/2014 1222   CO2 24 03/31/2014 1222   CO2 22 10/21/2013 1034   BUN 88* 03/31/2014 1222   BUN 45.8* 10/21/2013 1034   CREATININE 2.91* 03/31/2014 1222   CREATININE 2.3* 10/21/2013 1034   CREATININE 2.67* 04/22/2013 1801      Component Value Date/Time   CALCIUM 8.9 03/31/2014 1222   CALCIUM 8.7 10/21/2013 1034   ALKPHOS 105 03/11/2014 0354   ALKPHOS 73 10/21/2013 1034   AST 19 03/11/2014 0354   AST 16 10/21/2013 1034   ALT 17 03/11/2014 0354   ALT 17 10/21/2013 1034   BILITOT 0.5 03/11/2014 0354   BILITOT <0.20 10/21/2013 1034       Impression and Plan:  81 year old gentleman with the following issues:   1.  Multifactorial anemia: He has an element of anemia of renal disease as well as iron deficiency anemia. His bone marrow biopsy completed on 09/09/2013 suggesting myelodysplastic syndrome. He is currently getting Aranesp every 2 weeks and have not needed any transfusions recently.  I plan on increasing the frequency of his Aranesp to every 3 weeks. We will continue to transfuse as needed although I do not anticipate him needing one recently.   2. History of prostate cancer: He is status post prostatectomy close to 15 years ago.Now he has questionable sclerotic lesions with a PSA of around 1.89 and that was repeated and it was down to 1.09. It is unclear whether his bone lesions are indeed metastatic prostate cancer as his PSA continue to be very low and these lesions  are not very obvious on the bone scan. We'll continue to monitor this for the time being.  3. Congestive heart failure: He appears to be compensated at this time. Improving his hemoglobin will certainly help with his compensated heart failure.   4. Followup: He will follow-up every 3 weeks for an injection and a clinical visit in April 2016.      Carlton Adam, [place]  2/17/20164:34 PM

## 2014-04-07 NOTE — Telephone Encounter (Signed)
Confirm echo appt for 02/22.

## 2014-04-08 ENCOUNTER — Telehealth: Payer: Self-pay | Admitting: Oncology

## 2014-04-08 NOTE — Telephone Encounter (Signed)
Pt's daughter [name] called to r/s injection same day on 03/09, confirmed time with daughter.... KJ

## 2014-04-09 ENCOUNTER — Telehealth (HOSPITAL_COMMUNITY): Payer: Self-pay | Admitting: Vascular Surgery

## 2014-04-09 NOTE — Telephone Encounter (Signed)
[facility] weights are stable 224 to 225.[name]

## 2014-04-12 NOTE — Patient Instructions (Signed)
Continue lab and Aranesp injections every 3 weeks Follow up in 2 months

## 2014-04-16 ENCOUNTER — Telehealth: Payer: Self-pay | Admitting: Internal Medicine

## 2014-04-16 NOTE — Telephone Encounter (Signed)
Has new small open area on right lower leg.  Some others have healed.  Has been using Vaseline gauze.  Wants to know if can get an order for the new wound.  Please call with verbal order.

## 2014-04-19 NOTE — Telephone Encounter (Signed)
Called nurse (donita) no answer LMOM with md response...[name]

## 2014-04-19 NOTE — Telephone Encounter (Signed)
Wound care Clinic referral OK

## 2014-04-19 NOTE — Telephone Encounter (Signed)
MD is out of office. Pls advise...[name]

## 2014-04-23 ENCOUNTER — Telehealth (HOSPITAL_COMMUNITY): Payer: Self-pay | Admitting: Vascular Surgery

## 2014-04-23 NOTE — Telephone Encounter (Signed)
[facility] from [facility] left message weight has be stable 223 last 2 days 226. abd girth increased from 133 cm to 138 cm rt ankle edema , left leg water blisters.. Please advise

## 2014-04-23 NOTE — Telephone Encounter (Signed)
Spoke with [name] who said patient's weight was up 2-3 lbs, not really SOB or in distress but had generalized edema.  States they still have some metolazone at home left over from old Rx that he used to take.  Unsure if 2.5mg  or 5mg  tablet.  Advised to have him take one of those tablets today with pm torsemide dose (60mg ).  Aware and agreeable.

## 2014-04-28 ENCOUNTER — Ambulatory Visit (HOSPITAL_COMMUNITY)
Admission: RE | Admit: 2014-04-28 | Discharge: 2014-04-28 | Disposition: A | Discharge status: Home / Self Care | Payer: Medicare PPO | Source: Ambulatory Visit | Attending: Internal Medicine | Admitting: Internal Medicine

## 2014-04-28 ENCOUNTER — Encounter (HOSPITAL_COMMUNITY): Payer: Self-pay

## 2014-04-28 ENCOUNTER — Ambulatory Visit: Payer: Medicare PPO

## 2014-04-28 ENCOUNTER — Ambulatory Visit (HOSPITAL_BASED_OUTPATIENT_CLINIC_OR_DEPARTMENT_OTHER): Payer: Medicare PPO

## 2014-04-28 ENCOUNTER — Other Ambulatory Visit: Payer: Medicare PPO

## 2014-04-28 VITALS — BP 121/58 | HR 69 | Resp 20 | Wt 234.0 lb

## 2014-04-28 DIAGNOSIS — Z9114 Patient's other noncompliance with medication regimen: Secondary | ICD-10-CM | POA: Insufficient documentation

## 2014-04-28 DIAGNOSIS — E785 Hyperlipidemia, unspecified: Secondary | ICD-10-CM | POA: Insufficient documentation

## 2014-04-28 DIAGNOSIS — N183 Chronic kidney disease, stage 3 unspecified: Secondary | ICD-10-CM

## 2014-04-28 DIAGNOSIS — N189 Chronic kidney disease, unspecified: Secondary | ICD-10-CM

## 2014-04-28 DIAGNOSIS — Z7982 Long term (current) use of aspirin: Secondary | ICD-10-CM | POA: Diagnosis not present

## 2014-04-28 DIAGNOSIS — C61 Malignant neoplasm of prostate: Secondary | ICD-10-CM | POA: Insufficient documentation

## 2014-04-28 DIAGNOSIS — I480 Paroxysmal atrial fibrillation: Secondary | ICD-10-CM | POA: Insufficient documentation

## 2014-04-28 DIAGNOSIS — E669 Obesity, unspecified: Secondary | ICD-10-CM | POA: Diagnosis not present

## 2014-04-28 DIAGNOSIS — Z79899 Other long term (current) drug therapy: Secondary | ICD-10-CM | POA: Diagnosis not present

## 2014-04-28 DIAGNOSIS — I5032 Chronic diastolic (congestive) heart failure: Secondary | ICD-10-CM | POA: Diagnosis not present

## 2014-04-28 DIAGNOSIS — I5033 Acute on chronic diastolic (congestive) heart failure: Secondary | ICD-10-CM

## 2014-04-28 DIAGNOSIS — E039 Hypothyroidism, unspecified: Secondary | ICD-10-CM | POA: Diagnosis not present

## 2014-04-28 DIAGNOSIS — I441 Atrioventricular block, second degree: Secondary | ICD-10-CM | POA: Diagnosis not present

## 2014-04-28 DIAGNOSIS — Z794 Long term (current) use of insulin: Secondary | ICD-10-CM | POA: Insufficient documentation

## 2014-04-28 DIAGNOSIS — E119 Type 2 diabetes mellitus without complications: Secondary | ICD-10-CM | POA: Diagnosis not present

## 2014-04-28 DIAGNOSIS — D631 Anemia in chronic kidney disease: Secondary | ICD-10-CM

## 2014-04-28 DIAGNOSIS — I2510 Atherosclerotic heart disease of native coronary artery without angina pectoris: Secondary | ICD-10-CM | POA: Diagnosis not present

## 2014-04-28 DIAGNOSIS — I129 Hypertensive chronic kidney disease with stage 1 through stage 4 chronic kidney disease, or unspecified chronic kidney disease: Secondary | ICD-10-CM | POA: Insufficient documentation

## 2014-04-28 DIAGNOSIS — G4733 Obstructive sleep apnea (adult) (pediatric): Secondary | ICD-10-CM | POA: Diagnosis not present

## 2014-04-28 DIAGNOSIS — Z8673 Personal history of transient ischemic attack (TIA), and cerebral infarction without residual deficits: Secondary | ICD-10-CM | POA: Insufficient documentation

## 2014-04-28 DIAGNOSIS — J449 Chronic obstructive pulmonary disease, unspecified: Secondary | ICD-10-CM | POA: Insufficient documentation

## 2014-04-28 LAB — BASIC METABOLIC PANEL
Anion gap: 11 (ref 5–15)
BUN: 114 mg/dL — AB (ref 6–23)
CO2: 26 mmol/L (ref 19–32)
Calcium: 8.8 mg/dL (ref 8.4–10.5)
Chloride: 98 mmol/L (ref 96–112)
Creatinine, Ser: 2.97 mg/dL — ABNORMAL HIGH (ref 0.50–1.35)
GFR calc Af Amer: 22 mL/min — ABNORMAL LOW (ref >90)
GFR calc non Af Amer: 19 mL/min — ABNORMAL LOW (ref >90)
Glucose, Bld: 194 mg/dL — ABNORMAL HIGH (ref 70–99)
Potassium: 3.5 mmol/L (ref 3.5–5.1)
Sodium: 135 mmol/L (ref 135–145)

## 2014-04-28 LAB — CBC WITH DIFFERENTIAL/PLATELET
BASO%: 0.1 % (ref 0.0–2.0)
Basophils Absolute: 0 10*3/uL (ref 0.0–0.1)
EOS%: 3.4 % (ref 0.0–7.0)
Eosinophils Absolute: 0.3 10*3/uL (ref 0.0–0.5)
HCT: 35.2 % — ABNORMAL LOW (ref 38.4–49.9)
HGB: 11.3 g/dL — ABNORMAL LOW (ref 13.0–17.1)
LYMPH#: 0.8 10*3/uL — AB (ref 0.9–3.3)
LYMPH%: 11.3 % — ABNORMAL LOW (ref 14.0–49.0)
MCH: 25.9 pg — ABNORMAL LOW (ref 27.2–33.4)
MCHC: 32.1 g/dL (ref 32.0–36.0)
MCV: 80.7 fL (ref 79.3–98.0)
MONO#: 0.8 10*3/uL (ref 0.1–0.9)
MONO%: 10.9 % (ref 0.0–14.0)
NEUT%: 74.3 % (ref 39.0–75.0)
NEUTROS ABS: 5.6 10*3/uL (ref 1.5–6.5)
NRBC: 0 % (ref 0–0)
PLATELETS: 266 10*3/uL (ref 140–400)
RBC: 4.36 10*6/uL (ref 4.20–5.82)
RDW: 18.1 % — ABNORMAL HIGH (ref 11.0–14.6)
WBC: 7.5 10*3/uL (ref 4.0–10.3)

## 2014-04-28 LAB — BRAIN NATRIURETIC PEPTIDE: B Natriuretic Peptide: 153.7 pg/mL — ABNORMAL HIGH (ref 0.0–100.0)

## 2014-04-28 MED ORDER — DARBEPOETIN ALFA 300 MCG/0.6ML IJ SOSY: 300.0000 ug | PREFILLED_SYRINGE | Freq: Once | INTRAMUSCULAR | Status: DC

## 2014-04-28 NOTE — Progress Notes (Signed)
Patient ID: [name], male   DOB: 03/04/1932, 81 y.o.   MRN: [id number]  Oncologist: Dr [name]   HPI: [name] is a very pleasant 81 year old male with a history of diastolic heart failure as well as 2nd degree heart block (Wenckebach), hypertension, hyperlipidemia, diabetes, previous CVA, and paroxysmal atrial fibrillation on amiodarone. Not on coumadin as felt to be high risk of bleeding due to h/o ETOH. Echo 3/11 EF 60-65% mild MR. Also has history of prostate cancer with prostatectomy 15 year ago and now with lesion on spine not thought to be from cancer.     Admitted to [facility] with volume overload 9/2 through 10/26/13 in the setting of recent transfusions. Diuresed with IV lasix. Discharge weigh was 238 pounds.   Admitted to [facility] 10/10 through 11/30/13 with increased dyspnea. Treated for HCAP and mild volume overload. Diuresed with IV lasix and given antibiotics. Discharge weight was 232 pounds.   He returns for  follow up. Overall feeling much better. Denies SOB/PND/Orthopnea.  Weight at home down to  223 pounds. Taking all medications. Following low salt diet.  Uses motorized wheelchair. Followed by [facility]. Daughter prepares pill box. Has Nurse Aide daily to assist with meals and ADLs.    Labs 10/26/13 Creatinine 2.07 K 3.5  Labs 11/30/13 K 4.1 Creatinine 2.31   ROS: All systems negative except as listed in HPI, PMH and Problem List.  Past Medical History  Diagnosis Date  . Chronic diastolic heart failure     secondary to diastolic dysfunction EF (previous EF 35-45%)ef 60%4/09  . Arrhythmia     atrial fibrillation /pt on amiodarone,thought to be poor coumadin  . Stroke   . Other and unspecified hyperlipidemia   . Hypertension   . Hypertrophy of prostate without urinary obstruction and other lower urinary tract symptoms (LUTS)   . Osteoarthrosis, unspecified whether generalized or localized, unspecified site   . Type II or unspecified type diabetes mellitus without mention of  complication, not stated as uncontrolled   . AV block, Mobitz 1     Intolerance to ACE's / discontiuation of beta blockers  . Obstructive sleep apnea   . Iron deficiency anemia, unspecified     s/p EGD and coloscopy 3/10.gastritis.hemorrhids  . Cerebrovascular accident   . Renal insufficiency   . Obesity   . Allergic rhinitis, cause unspecified 05/29/2010  . CAD (coronary artery disease)     cath 12/04: mLAD 50-60%, mCFX 20-30%, pRCA 50-60%, mRCA 40%  . Normochromic normocytic anemia 04/21/2013  . Abnormal CXR 04/21/2013  . Hypertensive kidney disease with CKD stage III 04/21/2013  . Prostate cancer   . CHF (congestive heart failure)   . COPD (chronic obstructive pulmonary disease)   . Shortness of breath dyspnea   . Pneumonia     Current Outpatient Prescriptions  Medication Sig Dispense Refill  . allopurinol (ZYLOPRIM) 100 MG tablet Take 1 tablet (100 mg total) by mouth at bedtime. 30 tablet 11  . amiodarone (PACERONE) 200 MG tablet Take 0.5 tablets (100 mg total) by mouth every morning. 45 tablet 3  . amLODipine (NORVASC) 10 MG tablet Take 10 mg by mouth daily.    Marland Kitchen aspirin EC 81 MG EC tablet Take 1 tablet (81 mg total) by mouth daily.    Marland Kitchen atorvastatin (LIPITOR) 40 MG tablet TAKE 1 TABLET BY MOUTH EVERY DAY 30 tablet 6  . B-D ULTRAFINE III SHORT PEN 31G X 8 MM MISC   3  . citalopram (CELEXA) 10 MG tablet  Take 20 mg by mouth daily.     . cloNIDine (CATAPRES - DOSED IN MG/24 HR) 0.1 mg/24hr patch Place 1 patch (0.1 mg total) onto the skin once a week. 4 patch 12  . Fluticasone-Salmeterol (ADVAIR) 100-50 MCG/DOSE AEPB Inhale 1 puff into the lungs as needed (for shortness of breath).     Marland Kitchen glucose blood (ONE TOUCH ULTRA TEST) test strip 1 each by Other route 3 (three) times daily. Use as directed three times daily to check blood sugar.  Diagnosis code E11.29 100 each 5  . hydrALAZINE (APRESOLINE) 100 MG tablet Take 100 mg by mouth 3 (three) times daily.  6  . Insulin Detemir (LEVEMIR) 100  UNIT/ML Pen Inject 45 Units into the skin 2 (two) times daily. 15 mL   . isosorbide mononitrate (IMDUR) 60 MG 24 hr tablet Take 1 tablet (60 mg total) by mouth daily. 30 tablet 5  . levothyroxine (SYNTHROID, LEVOTHROID) 125 MCG tablet Take 125 mcg by mouth every morning.  11  . neomycin-bacitracin-polymyxin (NEOSPORIN) OINT Apply 1 application topically daily.    Marland Kitchen omeprazole (PRILOSEC) 20 MG capsule Take 1 capsule (20 mg total) by mouth daily. 30 capsule 6  . potassium chloride SA (K-DUR,KLOR-CON) 20 MEQ tablet Take 1 tablet (20 mEq total) by mouth 2 (two) times daily. 180 tablet 3  . silver sulfADIAZINE (SILVADENE) 1 % cream Apply topically daily. Topical, Daily, For 21 days Apply to Bilateral LE ulcerations twice daily for three days (Wednesday through Friday) then decrease dressing changes to daily and continue for 21 days. 50 g 0  . torsemide (DEMADEX) 20 MG tablet Take 3 tablets (60 mg total) by mouth 2 (two) times daily. 180 tablet 3  . TRIGELS-F FORTE 460-60-0.01-1 MG CAPS capsule TAKE 1 CAPSULE BY MOUTH DAILY. 30 capsule 5   No current facility-administered medications for this encounter.     PHYSICAL EXAM: Filed Vitals:   04/28/14 1517  BP: 121/58  Pulse: 69  Resp: 20  Weight: 234 lb (106.142 kg)  SpO2: 92%    General:  Chronically illl appearing. No resp difficulty. Arrived in wheelchair with his daughter.  HEENT: normal Neck: supple. JVP 6-7 Carotids 2+ bilaterally; no bruits. No lymphadenopathy or thryomegaly appreciated. Cor: PMI normal. irregualr rate & rhythm. 2/6 TR Lungs: Clear.  Abdomen: obese, soft, nontender, nondistended. No hepatosplenomegaly. No bruits or masses. Good bowel sounds. Extremities: no cyanosis, clubbing, rash,  No edema.    Neuro: alert & orientedx3, cranial nerves grossly intact. Moves all 4 extremities w/o difficulty. Affect pleasant.   ASSESSMENT & PLAN: 1. Chronic Diastolic Heart Failure . NYHA III. 10/2013 ECHO normal EF Grade III DD.  Volume status improved. Appears euvolemic. Continue current dose to torsemide 60 mg twice a day + 20 meq twice a day.   Check BMET today.  Reinforced daily weights, low salt food choices, and limiting fluid intake to <2 liters per day.  2. PAF- [place] VASC Score 8. Controlled rate.  Continue amiodarone 100 mg daily not on anticoagulant due to noncompliance and falls.  3. CKD -Stage IIIbaseline 1.7-1.9 - Check BMET today  4. Hypothyroidism - Per PCP Dr Jenny Reichmann  5. Prostate Cancer - S/P Prostatectomy 15 years ago with questionable lesion on spine  . Followed by Dr [name]   Follow up in 2 months   Najai Waszak,NP-C  3:13 PM

## 2014-04-28 NOTE — Patient Instructions (Signed)
Labs today  Your physician recommends that you schedule a follow-up appointment in: 2 months  Do the following things EVERYDAY: 1) Weigh yourself in the morning before breakfast. Write it down and keep it in a log. 2) Take your medicines as prescribed 3) Eat low salt foods-Limit salt (sodium) to 2000 mg per day.  4) Stay as active as you can everyday 5) Limit all fluids for the day to less than 2 liters 6)   

## 2014-04-29 ENCOUNTER — Telehealth (HOSPITAL_COMMUNITY): Payer: Self-pay

## 2014-04-29 NOTE — Telephone Encounter (Signed)
Left voicemail to call back.  Per Darrick Grinder NP-C renal function up. Hold torsemide for 2 days then take torsemide 40 mg twice a day. Hold potassium for 2 days. Then restart current potassium dose. Ask [facility] to check BMET next Thursday.  Renee Pain

## 2014-04-30 ENCOUNTER — Telehealth: Payer: Self-pay | Admitting: Internal Medicine

## 2014-04-30 MED ORDER — INSULIN DETEMIR 100 UNIT/ML FLEXPEN: 50.0000 [IU] | PEN_INJECTOR | Freq: Two times a day (BID) | SUBCUTANEOUS | Status: DC

## 2014-04-30 NOTE — Telephone Encounter (Signed)
Ok to increase the levemir to 50 units twice per day (from 45 units bid)

## 2014-04-30 NOTE — Telephone Encounter (Signed)
[name] [phone]    Blood sugars  tues M-233 E-177  Wed M-201 E-257  Thur M-257 E-237  Friday  M-244

## 2014-05-03 ENCOUNTER — Telehealth (HOSPITAL_COMMUNITY): Payer: Self-pay

## 2014-05-03 ENCOUNTER — Telehealth: Payer: Self-pay | Admitting: Internal Medicine

## 2014-05-03 MED ORDER — TORSEMIDE 20 MG PO TABS: 40.0000 mg | ORAL_TABLET | Freq: Two times a day (BID) | ORAL | Status: DC

## 2014-05-03 NOTE — Telephone Encounter (Signed)
Spoke with patient's daughter concerning recent med changes due to high kidney levels from 3/9 lab work who is responsible for patient's medication administrations.  Per Amy Clegg NP-C  Hold torsemide for 2 days then take torsemide 40 mg twice a day. Hold potassium for 2 days. Then restart current potassium dose. Aware and agreeable.  Renee Pain

## 2014-05-03 NOTE — Telephone Encounter (Signed)
Pt request refill for TRIGELS-F FORTE 460-60-0.01-1 MG CAPS to be [facility] to [facility]

## 2014-05-04 ENCOUNTER — Encounter: Payer: Self-pay | Admitting: Gastroenterology

## 2014-05-04 NOTE — Telephone Encounter (Signed)
Called patient back, spoke with daughter on the phone. Daughter verbalized understanding.

## 2014-05-04 NOTE — Telephone Encounter (Signed)
Pt. Still has remaining refills on the request that was sent back in January.

## 2014-05-05 NOTE — Telephone Encounter (Signed)
Ok, no need for new rx then, thanks

## 2014-05-07 ENCOUNTER — Telehealth (HOSPITAL_COMMUNITY): Payer: Self-pay | Admitting: Vascular Surgery

## 2014-05-07 NOTE — Telephone Encounter (Signed)
[facility] pt gained 3 lbs over night.. Please advise

## 2014-05-07 NOTE — Telephone Encounter (Signed)
Attempted to reach patient and daughter by phone, no answer.  Will attempt to call later today.  Renee Pain

## 2014-05-11 NOTE — Telephone Encounter (Signed)
Pharmacy told daughter that they do not have anymore refill on script.  [facility] [address] is telling them that there are no refills remaining.  What should she do?     Best number [phone]

## 2014-05-12 ENCOUNTER — Other Ambulatory Visit (HOSPITAL_COMMUNITY): Payer: Self-pay | Admitting: *Deleted

## 2014-05-12 NOTE — Telephone Encounter (Signed)
Called patient's daughter back. Told that the new RX was sent over back in January. [facility] said that the pt. Might be using an old RX number to call in refill. [facility] stated on the phone that they would fill the RX. Daughter notified.

## 2014-05-19 ENCOUNTER — Ambulatory Visit: Payer: Medicare PPO

## 2014-05-19 ENCOUNTER — Telehealth: Payer: Self-pay | Admitting: Internal Medicine

## 2014-05-19 ENCOUNTER — Other Ambulatory Visit (HOSPITAL_BASED_OUTPATIENT_CLINIC_OR_DEPARTMENT_OTHER): Payer: Medicare PPO

## 2014-05-19 DIAGNOSIS — N289 Disorder of kidney and ureter, unspecified: Secondary | ICD-10-CM

## 2014-05-19 DIAGNOSIS — N183 Chronic kidney disease, stage 3 unspecified: Secondary | ICD-10-CM

## 2014-05-19 DIAGNOSIS — D509 Iron deficiency anemia, unspecified: Secondary | ICD-10-CM

## 2014-05-19 DIAGNOSIS — D649 Anemia, unspecified: Secondary | ICD-10-CM | POA: Diagnosis not present

## 2014-05-19 LAB — CBC WITH DIFFERENTIAL/PLATELET
BASO%: 0.1 % (ref 0.0–2.0)
Basophils Absolute: 0 10*3/uL (ref 0.0–0.1)
EOS%: 5 % (ref 0.0–7.0)
Eosinophils Absolute: 0.3 10*3/uL (ref 0.0–0.5)
HCT: 34.3 % — ABNORMAL LOW (ref 38.4–49.9)
HEMOGLOBIN: 10.7 g/dL — AB (ref 13.0–17.1)
LYMPH#: 0.6 10*3/uL — AB (ref 0.9–3.3)
LYMPH%: 8.3 % — ABNORMAL LOW (ref 14.0–49.0)
MCH: 25.3 pg — AB (ref 27.2–33.4)
MCHC: 31.3 g/dL — ABNORMAL LOW (ref 32.0–36.0)
MCV: 80.8 fL (ref 79.3–98.0)
MONO#: 0.6 10*3/uL (ref 0.1–0.9)
MONO%: 8.6 % (ref 0.0–14.0)
NEUT%: 78 % — AB (ref 39.0–75.0)
NEUTROS ABS: 5.3 10*3/uL (ref 1.5–6.5)
Platelets: 268 10*3/uL (ref 140–400)
RBC: 4.25 10*6/uL (ref 4.20–5.82)
RDW: 19.6 % — ABNORMAL HIGH (ref 11.0–14.6)
WBC: 6.8 10*3/uL (ref 4.0–10.3)

## 2014-05-19 NOTE — Progress Notes (Signed)
Per Dr [name] patient doesn't need Aranesp injection.  HGb 10.7

## 2014-05-19 NOTE — Telephone Encounter (Signed)
Sorry, I dont feel comfortable with a note to say this. thanks

## 2014-05-19 NOTE — Telephone Encounter (Signed)
Patient has been going to [facility] every morning and they need a note stating that can go to the center without any assistance. Please call [name]

## 2014-05-20 NOTE — Telephone Encounter (Signed)
Left detailed message on [name]'s phone. Letter is at the front desk.

## 2014-05-20 NOTE — Telephone Encounter (Signed)
Dr. Jenny Reichmann, if this is ok, I will type the letter for him.

## 2014-05-20 NOTE — Telephone Encounter (Signed)
I spoke to [place].  She asks if it is possible to write a letter stating that the patient is healthy enough to attend a senior program each morning at the senior center (omitting the fact that he is alone). This is not an exercise program, but rather a social gathering.

## 2014-05-20 NOTE — Telephone Encounter (Signed)
Ok for this, thanks 

## 2014-06-09 ENCOUNTER — Ambulatory Visit (HOSPITAL_BASED_OUTPATIENT_CLINIC_OR_DEPARTMENT_OTHER): Payer: Medicare PPO | Admitting: Oncology

## 2014-06-09 ENCOUNTER — Ambulatory Visit: Payer: Medicare PPO

## 2014-06-09 ENCOUNTER — Other Ambulatory Visit (HOSPITAL_BASED_OUTPATIENT_CLINIC_OR_DEPARTMENT_OTHER): Payer: Medicare PPO

## 2014-06-09 ENCOUNTER — Telehealth: Payer: Self-pay | Admitting: Oncology

## 2014-06-09 VITALS — BP 136/60 | HR 66 | Temp 98.0°F | Resp 18 | Ht 70.0 in | Wt 248.8 lb

## 2014-06-09 DIAGNOSIS — D631 Anemia in chronic kidney disease: Secondary | ICD-10-CM

## 2014-06-09 DIAGNOSIS — Z8546 Personal history of malignant neoplasm of prostate: Secondary | ICD-10-CM

## 2014-06-09 DIAGNOSIS — N189 Chronic kidney disease, unspecified: Secondary | ICD-10-CM

## 2014-06-09 DIAGNOSIS — I509 Heart failure, unspecified: Secondary | ICD-10-CM | POA: Diagnosis not present

## 2014-06-09 DIAGNOSIS — N183 Chronic kidney disease, stage 3 unspecified: Secondary | ICD-10-CM

## 2014-06-09 DIAGNOSIS — D469 Myelodysplastic syndrome, unspecified: Secondary | ICD-10-CM

## 2014-06-09 DIAGNOSIS — D509 Iron deficiency anemia, unspecified: Secondary | ICD-10-CM | POA: Diagnosis not present

## 2014-06-09 LAB — CBC WITH DIFFERENTIAL/PLATELET
BASO%: 0.2 % (ref 0.0–2.0)
Basophils Absolute: 0 10*3/uL (ref 0.0–0.1)
EOS%: 3.9 % (ref 0.0–7.0)
Eosinophils Absolute: 0.3 10*3/uL (ref 0.0–0.5)
HEMATOCRIT: 32.8 % — AB (ref 38.4–49.9)
HGB: 10.1 g/dL — ABNORMAL LOW (ref 13.0–17.1)
LYMPH%: 9.1 % — AB (ref 14.0–49.0)
MCH: 25 pg — ABNORMAL LOW (ref 27.2–33.4)
MCHC: 30.9 g/dL — ABNORMAL LOW (ref 32.0–36.0)
MCV: 80.7 fL (ref 79.3–98.0)
MONO#: 0.8 10*3/uL (ref 0.1–0.9)
MONO%: 11.5 % (ref 0.0–14.0)
NEUT#: 5.3 10*3/uL (ref 1.5–6.5)
NEUT%: 75.3 % — AB (ref 39.0–75.0)
PLATELETS: 272 10*3/uL (ref 140–400)
RBC: 4.06 10*6/uL — AB (ref 4.20–5.82)
RDW: 20.4 % — ABNORMAL HIGH (ref 11.0–14.6)
WBC: 7 10*3/uL (ref 4.0–10.3)
lymph#: 0.6 10*3/uL — ABNORMAL LOW (ref 0.9–3.3)

## 2014-06-09 NOTE — Progress Notes (Signed)
Hematology and Oncology Follow Up Visit  [name] [id number] Apr 17, 1932 81 y.o. 06/09/2014 3:15 PM [name], MDJohn, Hunt Oris, MD   Principle Diagnosis: 81 year old gentleman with multifactorial anemia likely related to myelodysplastic syndrome. He also has an element of anemia of chronic disease as well as iron deficiency anemia. This was diagnosed in March of 2015. He also has questionable sclerotic bone lesions that were biopsied without any specific diagnosis made. He has a remote history of prostate cancer.  Current therapy: Supportive transfusions  as needed as well Aranesp 300 mcg every 3 weeks.  Interim History:  [name] presents today for a followup visit with his daughter. Since the last visit, he has not reporting any new complaints. He continues to be stable and participate in senior activity center. He is not reporting any chest pain or shortness of breath. He is able to ambulate with the help of a walker but for the most part his wheelchair bound. He has not had any falls or illnesses. He has not reported any complications from Aranesp . He has not required transfusions for at least the last 6 months. He is not reporting any difficulty breathing. He is not reporting any back pain shoulder pain or any bone pain at this time. He had not reported any hospitalizations or illnesses. Has not reported any headaches or blurry vision or double vision. Has not reported any nausea or vomiting or abdominal pain. Has not reported any frequency urgency or hesitancy. Rest or view of system is unremarkable.  Medications: I have reviewed the patient's current medications.  Current Outpatient Prescriptions  Medication Sig Dispense Refill  . allopurinol (ZYLOPRIM) 100 MG tablet Take 1 tablet (100 mg total) by mouth at bedtime. 30 tablet 11  . amiodarone (PACERONE) 200 MG tablet Take 0.5 tablets (100 mg total) by mouth every morning. 45 tablet 3  . amLODipine (NORVASC) 10 MG tablet Take 10 mg  by mouth daily.    Marland Kitchen aspirin EC 81 MG EC tablet Take 1 tablet (81 mg total) by mouth daily.    Marland Kitchen atorvastatin (LIPITOR) 40 MG tablet TAKE 1 TABLET BY MOUTH EVERY DAY 30 tablet 6  . B-D ULTRAFINE III SHORT PEN 31G X 8 MM MISC   3  . citalopram (CELEXA) 10 MG tablet Take 20 mg by mouth daily.     . cloNIDine (CATAPRES - DOSED IN MG/24 HR) 0.1 mg/24hr patch Place 1 patch (0.1 mg total) onto the skin once a week. 4 patch 12  . Fluticasone-Salmeterol (ADVAIR) 100-50 MCG/DOSE AEPB Inhale 1 puff into the lungs as needed (for shortness of breath).     Marland Kitchen glucose blood (ONE TOUCH ULTRA TEST) test strip 1 each by Other route 3 (three) times daily. Use as directed three times daily to check blood sugar.  Diagnosis code E11.29 100 each 5  . hydrALAZINE (APRESOLINE) 100 MG tablet Take 100 mg by mouth 3 (three) times daily.  6  . Insulin Detemir (LEVEMIR) 100 UNIT/ML Pen Inject 50 Units into the skin 2 (two) times daily. 15 mL 11  . isosorbide mononitrate (IMDUR) 60 MG 24 hr tablet Take 1 tablet (60 mg total) by mouth daily. 30 tablet 5  . neomycin-bacitracin-polymyxin (NEOSPORIN) OINT Apply 1 application topically daily.    Marland Kitchen omeprazole (PRILOSEC) 20 MG capsule Take 1 capsule (20 mg total) by mouth daily. 30 capsule 6  . potassium chloride SA (K-DUR,KLOR-CON) 20 MEQ tablet Take 1 tablet (20 mEq total) by mouth 2 (two) times  daily. 180 tablet 3  . silver sulfADIAZINE (SILVADENE) 1 % cream Apply topically daily. Topical, Daily, For 21 days Apply to Bilateral LE ulcerations twice daily for three days (Wednesday through Friday) then decrease dressing changes to daily and continue for 21 days. 50 g 0  . torsemide (DEMADEX) 20 MG tablet Take 2 tablets (40 mg total) by mouth 2 (two) times daily. 120 tablet 3  . TRIGELS-F FORTE 460-60-0.01-1 MG CAPS capsule TAKE 1 CAPSULE BY MOUTH DAILY. 30 capsule 5  . levothyroxine (SYNTHROID, LEVOTHROID) 125 MCG tablet Take 125 mcg by mouth every morning.  11   No current  facility-administered medications for this visit.     Allergies:  Allergies  Allergen Reactions  . Ace Inhibitors Other (See Comments)    HYPOTENSION  . Beta Adrenergic Blockers Other (See Comments)     use cautiously secondary to 2nd degree heart block, hypotension    Past Medical History, Surgical history, Social history, and Family History were reviewed and updated.   Physical Exam: Blood pressure 136/60, pulse 66, temperature 98 F (36.7 C), temperature source Oral, resp. rate 18, height 5' 10"  (1.778 m), weight 248 lb 12.8 oz (112.855 kg). ECOG: 2 General appearance: alert and cooperative elderly gentleman in a wheelchair not in any distress. Head: Normocephalic, without obvious abnormality Neck: no adenopathy Lymph nodes: Cervical, supraclavicular, and axillary nodes normal. Heart:regular rate and rhythm, S1, S2 normal, no murmur, click, rub or gallop Lung:chest clear, no wheezing, rales, normal symmetric air entry Abdomin: soft, non-tender, without masses or organomegaly EXT: 1+ edema bilateral lower extremities noted.   Lab Results: Lab Results  Component Value Date   WBC 7.0 06/09/2014   HGB 10.1* 06/09/2014   HCT 32.8* 06/09/2014   MCV 80.7 06/09/2014   PLT 272 06/09/2014     Chemistry      Component Value Date/Time   NA 135 04/28/2014 1530   NA 142 10/21/2013 1034   K 3.5 04/28/2014 1530   K 4.1 10/21/2013 1034   CL 98 04/28/2014 1530   CO2 26 04/28/2014 1530   CO2 22 10/21/2013 1034   BUN 114* 04/28/2014 1530   BUN 45.8* 10/21/2013 1034   CREATININE 2.97* 04/28/2014 1530   CREATININE 2.3* 10/21/2013 1034   CREATININE 2.67* 04/22/2013 1801      Component Value Date/Time   CALCIUM 8.8 04/28/2014 1530   CALCIUM 8.7 10/21/2013 1034   ALKPHOS 105 03/11/2014 0354   ALKPHOS 73 10/21/2013 1034   AST 19 03/11/2014 0354   AST 16 10/21/2013 1034   ALT 17 03/11/2014 0354   ALT 17 10/21/2013 1034   BILITOT 0.5 03/11/2014 0354   BILITOT <0.20 10/21/2013  1034       Impression and Plan:  81 year old gentleman with the following issues:   1. Multifactorial anemia: He has an element of anemia of renal disease as well as iron deficiency anemia. His bone marrow biopsy completed on 09/09/2013 suggesting myelodysplastic syndrome. He is currently getting Aranesp every 3 weeks and have not needed any transfusions recently.  I plan on increasing the frequency of his Aranesp to every 4 weeks. We will continue to transfuse as needed although I do not anticipate him needing one recently. The goal is to keep his hemoglobin above 10.   2. History of prostate cancer: He is status post prostatectomy close to 15 years ago.Now he has questionable sclerotic lesions with a PSA of around 1.89 and that was repeated and it was down to 1.09. It  is unclear whether his bone lesions are indeed metastatic prostate cancer as his PSA continue to be very low and these lesions are not very obvious on the bone scan. We'll continue to monitor this for the time being.  3. Congestive heart failure: He appears to be compensated at this time. I   4. Followup: He will follow-up every 4 weeks for an injection and a clinical visit in 4 months.       Zola Button, MD 4/20/20163:15 PM

## 2014-06-09 NOTE — Telephone Encounter (Signed)
gv and printed appt sched and avs for pt for May thru Aug

## 2014-06-09 NOTE — Progress Notes (Signed)
[name]'s HGB 10.1 today.  Doesn't need Aranesp today.

## 2014-06-11 ENCOUNTER — Ambulatory Visit: Payer: Medicare PPO | Admitting: Internal Medicine

## 2014-06-11 ENCOUNTER — Telehealth: Payer: Self-pay | Admitting: Internal Medicine

## 2014-06-11 DIAGNOSIS — I5032 Chronic diastolic (congestive) heart failure: Secondary | ICD-10-CM

## 2014-06-11 NOTE — Telephone Encounter (Signed)
daughter called in and wants to know if Dr Jenny Reichmann could send in a order to [facility] to have someone to come out and check pt legs again?

## 2014-06-14 NOTE — Telephone Encounter (Signed)
Daughter called in again to see if you could put in the order for home care.

## 2014-06-15 NOTE — Telephone Encounter (Signed)
Referral done

## 2014-06-17 ENCOUNTER — Telehealth: Payer: Self-pay | Admitting: Internal Medicine

## 2014-06-17 NOTE — Telephone Encounter (Signed)
error 

## 2014-06-21 ENCOUNTER — Other Ambulatory Visit (HOSPITAL_COMMUNITY): Payer: Self-pay | Admitting: Internal Medicine

## 2014-06-22 ENCOUNTER — Ambulatory Visit (INDEPENDENT_AMBULATORY_CARE_PROVIDER_SITE_OTHER): Payer: Medicare PPO | Admitting: Internal Medicine

## 2014-06-22 ENCOUNTER — Ambulatory Visit (HOSPITAL_COMMUNITY): Payer: Medicare PPO | Attending: Internal Medicine

## 2014-06-22 ENCOUNTER — Encounter: Payer: Self-pay | Admitting: Internal Medicine

## 2014-06-22 VITALS — BP 122/78 | HR 68 | Temp 98.5°F | Resp 16 | Ht 70.0 in | Wt 244.0 lb

## 2014-06-22 DIAGNOSIS — I10 Essential (primary) hypertension: Secondary | ICD-10-CM | POA: Diagnosis not present

## 2014-06-22 DIAGNOSIS — M7989 Other specified soft tissue disorders: Secondary | ICD-10-CM

## 2014-06-22 DIAGNOSIS — M79662 Pain in left lower leg: Secondary | ICD-10-CM

## 2014-06-22 DIAGNOSIS — M79605 Pain in left leg: Secondary | ICD-10-CM | POA: Diagnosis not present

## 2014-06-22 DIAGNOSIS — E785 Hyperlipidemia, unspecified: Secondary | ICD-10-CM | POA: Diagnosis not present

## 2014-06-22 DIAGNOSIS — M79672 Pain in left foot: Secondary | ICD-10-CM | POA: Diagnosis not present

## 2014-06-22 DIAGNOSIS — Z87891 Personal history of nicotine dependence: Secondary | ICD-10-CM | POA: Diagnosis not present

## 2014-06-22 DIAGNOSIS — E119 Type 2 diabetes mellitus without complications: Secondary | ICD-10-CM | POA: Insufficient documentation

## 2014-06-22 MED ORDER — TRAMADOL HCL 50 MG PO TABS: 50.0000 mg | ORAL_TABLET | Freq: Three times a day (TID) | ORAL | Status: DC | PRN

## 2014-06-22 NOTE — Assessment & Plan Note (Signed)
New onset, not explained by infection or tendonitis I should think - also for LLE venous doppler - r/o dvt

## 2014-06-22 NOTE — Progress Notes (Signed)
Pre visit review using our clinic review tool, if applicable. No additional management support is needed unless otherwise documented below in the visit note. 

## 2014-06-22 NOTE — Assessment & Plan Note (Signed)
prob someplantar fasciitis and/or achilles tendonitis, for leg elveation, avoid pressure to heels

## 2014-06-22 NOTE — Assessment & Plan Note (Signed)
stable overall by history and exam, recent data reviewed with pt, and pt to continue medical treatment as before,  to f/u any worsening symptoms or concerns BP Readings from Last 3 Encounters:  06/22/14 122/78  06/09/14 136/60  04/28/14 121/58

## 2014-06-22 NOTE — Patient Instructions (Signed)
Please take all new medication as prescribed - the pain medication  You will be contacted regarding the referral for: Left leg venous doppler (today if possibe)  Please continue all other medications as before, and refills have been done if requested.  Please have the pharmacy call with any other refills you may need.  Please keep your appointments with your specialists as you may have planned  You will be contacted regarding the referral for: Dr Smith/sports medicine for possible achilles tendonitis and plantar fasciitis

## 2014-06-22 NOTE — Progress Notes (Signed)
Subjective:    Patient ID: [name], male    DOB: 1932/04/30, 81 y.o.   MRN: [id number]  HPI  Here with 1 wk onset LLE swelling and pain, has known venous stasis ulcers but not infected it seems, family quite supportive and also seen by home health nurse with wraps for leg started yesterday.  Pt c/o pain to the more distal achilles area, somewhat to the calf, but the most pain he has is at the plantar heel.  Despite current foot wear, sits most of the day with legs on the floor. Pt denies chest pain, increased sob or doe, wheezing, orthopnea, PND, increased LE swelling, palpitations, dizziness or syncope.  . Past Medical History  Diagnosis Date  . Chronic diastolic heart failure     secondary to diastolic dysfunction EF (previous EF 35-45%)ef 60%4/09  . Arrhythmia     atrial fibrillation /pt on amiodarone,thought to be poor coumadin  . Stroke   . Other and unspecified hyperlipidemia   . Hypertension   . Hypertrophy of prostate without urinary obstruction and other lower urinary tract symptoms (LUTS)   . Osteoarthrosis, unspecified whether generalized or localized, unspecified site   . Type II or unspecified type diabetes mellitus without mention of complication, not stated as uncontrolled   . AV block, Mobitz 1     Intolerance to ACE's / discontiuation of beta blockers  . Obstructive sleep apnea   . Iron deficiency anemia, unspecified     s/p EGD and coloscopy 3/10.gastritis.hemorrhids  . Cerebrovascular accident   . Renal insufficiency   . Obesity   . Allergic rhinitis, cause unspecified 05/29/2010  . CAD (coronary artery disease)     cath 12/04: mLAD 50-60%, mCFX 20-30%, pRCA 50-60%, mRCA 40%  . Normochromic normocytic anemia 04/21/2013  . Abnormal CXR 04/21/2013  . Hypertensive kidney disease with CKD stage III 04/21/2013  . Prostate cancer   . CHF (congestive heart failure)   . COPD (chronic obstructive pulmonary disease)   . Shortness of breath dyspnea   . Pneumonia    Past  Surgical History  Procedure Laterality Date  . Prostate surgery    . Prostate surgury      hx of  . Esophagogastroduodenoscopy N/A 04/21/2013    Procedure: ESOPHAGOGASTRODUODENOSCOPY (EGD);  Surgeon: Irene Shipper, MD;  Location: [facility];  Service: Endoscopy;  Laterality: N/A;    reports that he has quit smoking. He has never used smokeless tobacco. He reports that he does not drink alcohol or use illicit drugs. family history includes CAD in his sister; Dementia in his mother; Heart disease in his father. Allergies  Allergen Reactions  . Ace Inhibitors Other (See Comments)    HYPOTENSION  . Beta Adrenergic Blockers Other (See Comments)     use cautiously secondary to 2nd degree heart block, hypotension   Current Outpatient Prescriptions on File Prior to Visit  Medication Sig Dispense Refill  . allopurinol (ZYLOPRIM) 100 MG tablet Take 1 tablet (100 mg total) by mouth at bedtime. 30 tablet 11  . amiodarone (PACERONE) 200 MG tablet Take 0.5 tablets (100 mg total) by mouth every morning. 45 tablet 3  . amLODipine (NORVASC) 10 MG tablet Take 10 mg by mouth daily.    Marland Kitchen amLODipine (NORVASC) 10 MG tablet TAKE 1 TABLET BY MOUTH EVERY DAY 30 tablet 3  . aspirin EC 81 MG EC tablet Take 1 tablet (81 mg total) by mouth daily.    Marland Kitchen atorvastatin (LIPITOR) 40 MG tablet TAKE  1 TABLET BY MOUTH EVERY DAY 30 tablet 6  . B-D ULTRAFINE III SHORT PEN 31G X 8 MM MISC   3  . citalopram (CELEXA) 10 MG tablet Take 20 mg by mouth daily.     . cloNIDine (CATAPRES - DOSED IN MG/24 HR) 0.1 mg/24hr patch Place 1 patch (0.1 mg total) onto the skin once a week. 4 patch 12  . Fluticasone-Salmeterol (ADVAIR) 100-50 MCG/DOSE AEPB Inhale 1 puff into the lungs as needed (for shortness of breath).     Marland Kitchen glucose blood (ONE TOUCH ULTRA TEST) test strip 1 each by Other route 3 (three) times daily. Use as directed three times daily to check blood sugar.  Diagnosis code E11.29 100 each 5  . hydrALAZINE (APRESOLINE) 100 MG  tablet Take 100 mg by mouth 3 (three) times daily.  6  . Insulin Detemir (LEVEMIR) 100 UNIT/ML Pen Inject 50 Units into the skin 2 (two) times daily. 15 mL 11  . isosorbide mononitrate (IMDUR) 60 MG 24 hr tablet Take 1 tablet (60 mg total) by mouth daily. 30 tablet 5  . levothyroxine (SYNTHROID, LEVOTHROID) 125 MCG tablet Take 125 mcg by mouth every morning.  11  . neomycin-bacitracin-polymyxin (NEOSPORIN) OINT Apply 1 application topically daily.    Marland Kitchen omeprazole (PRILOSEC) 20 MG capsule Take 1 capsule (20 mg total) by mouth daily. 30 capsule 6  . potassium chloride SA (K-DUR,KLOR-CON) 20 MEQ tablet Take 1 tablet (20 mEq total) by mouth 2 (two) times daily. 180 tablet 3  . silver sulfADIAZINE (SILVADENE) 1 % cream Apply topically daily. Topical, Daily, For 21 days Apply to Bilateral LE ulcerations twice daily for three days (Wednesday through Friday) then decrease dressing changes to daily and continue for 21 days. 50 g 0  . torsemide (DEMADEX) 20 MG tablet Take 2 tablets (40 mg total) by mouth 2 (two) times daily. 120 tablet 3  . TRIGELS-F FORTE 460-60-0.01-1 MG CAPS capsule TAKE 1 CAPSULE BY MOUTH DAILY. 30 capsule 5   No current facility-administered medications on file prior to visit.   Review of Systems  Constitutional: Negative for unusual diaphoresis or night sweats HENT: Negative for ringing in ear or discharge Eyes: Negative for double vision or worsening visual disturbance.  Respiratory: Negative for choking and stridor.   Gastrointestinal: Negative for vomiting or other signifcant bowel change Genitourinary: Negative for hematuria or change in urine volume.  Musculoskeletal: Negative for other MSK pain or swelling Skin: Negative for color change and worsening wound.  Neurological: Negative for tremors and numbness other than noted  Psychiatric/Behavioral: Negative for decreased concentration or agitation other than above       Objective:   Physical Exam BP 122/78 mmHg  Pulse  68  Temp(Src) 98.5 F (36.9 C) (Oral)  Resp 16  Ht 5\' 10"  (1.778 m)  Wt 244 lb (110.678 kg)  BMI 35.01 kg/m2  SpO2 94% VS noted,  Constitutional: Pt appears in no significant distress HENT: Head: NCAT.  Right Ear: External ear normal.  Left Ear: External ear normal.  Eyes: . Pupils are equal, round, and reactive to light. Conjunctivae and EOM are normal Neck: Normal range of motion. Neck supple.  Cardiovascular: Normal rate and regular rhythm.   Pulmonary/Chest: Effort normal and breath sounds without rales or wheezing.  Abd:  Soft, NT, ND, + BS Neurological: Pt is alert. Not confused , motor grossly intact Skin: Skin is warm. No rash, 2+ edema LLE with mild calf but also achilles, and even very tender left  plantar heel all without specific ulcer or infection to account for this  Psychiatric: Pt behavior is normal. No agitation.      Assessment & Plan:

## 2014-06-23 ENCOUNTER — Encounter: Payer: Self-pay | Admitting: Internal Medicine

## 2014-07-02 DIAGNOSIS — E11621 Type 2 diabetes mellitus with foot ulcer: Secondary | ICD-10-CM | POA: Diagnosis not present

## 2014-07-13 ENCOUNTER — Ambulatory Visit (INDEPENDENT_AMBULATORY_CARE_PROVIDER_SITE_OTHER): Payer: Medicare PPO | Admitting: Family Medicine

## 2014-07-13 ENCOUNTER — Encounter: Payer: Self-pay | Admitting: Family Medicine

## 2014-07-13 VITALS — BP 134/60 | HR 70

## 2014-07-13 DIAGNOSIS — M6788 Other specified disorders of synovium and tendon, other site: Secondary | ICD-10-CM | POA: Insufficient documentation

## 2014-07-13 DIAGNOSIS — M7662 Achilles tendinitis, left leg: Secondary | ICD-10-CM

## 2014-07-13 MED ORDER — DICLOFENAC SODIUM 1 % TD GEL: 2.0000 g | Freq: Two times a day (BID) | TRANSDERMAL | Status: DC

## 2014-07-13 NOTE — Assessment & Plan Note (Signed)
Patient has more of an Achilles tendinosis noted. I do think that also this is multifactorial with patient having more of a dependent edema. Discussed with patient about the possibility of compression stockings. Patient states that he had increasing ulcers after that. Patient will avoid this. Patient will try more with elevation. We discussed range of motion exercises and patient given more exercise prescription today. We discussed the possibility of formal physical therapy which patient declined with him improving slowly. Patient will do a very low gingerly amount of icing to help with some of the discomfort and patient given a topical anti-inflammatory. Patient and will follow-up with me again in 3 weeks for further evaluation and treatment.

## 2014-07-13 NOTE — Progress Notes (Signed)
Pre visit review using our clinic review tool, if applicable. No additional management support is needed unless otherwise documented below in the visit note. 

## 2014-07-13 NOTE — Patient Instructions (Addendum)
Good to see you Ice 10 minutes 2 times daily. Usually after activity and before bed. Try to spell alphabet with ankle when sitting Try to elevate feet when yo ucan.  If not perfect in 3 weeks come back and see me and we will consider physical therapy.
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
## 2014-07-14 ENCOUNTER — Ambulatory Visit: Payer: Medicare PPO

## 2014-07-14 ENCOUNTER — Other Ambulatory Visit (HOSPITAL_BASED_OUTPATIENT_CLINIC_OR_DEPARTMENT_OTHER): Payer: Medicare PPO

## 2014-07-14 DIAGNOSIS — N189 Chronic kidney disease, unspecified: Secondary | ICD-10-CM

## 2014-07-14 DIAGNOSIS — D631 Anemia in chronic kidney disease: Secondary | ICD-10-CM

## 2014-07-14 DIAGNOSIS — D469 Myelodysplastic syndrome, unspecified: Secondary | ICD-10-CM

## 2014-07-14 LAB — CBC WITH DIFFERENTIAL/PLATELET
BASO%: 0.1 % (ref 0.0–2.0)
BASOS ABS: 0 10*3/uL (ref 0.0–0.1)
EOS%: 5.1 % (ref 0.0–7.0)
Eosinophils Absolute: 0.4 10*3/uL (ref 0.0–0.5)
HCT: 32.1 % — ABNORMAL LOW (ref 38.4–49.9)
HGB: 10.2 g/dL — ABNORMAL LOW (ref 13.0–17.1)
LYMPH#: 0.8 10*3/uL — AB (ref 0.9–3.3)
LYMPH%: 11.3 % — ABNORMAL LOW (ref 14.0–49.0)
MCH: 25.6 pg — ABNORMAL LOW (ref 27.2–33.4)
MCHC: 31.8 g/dL — ABNORMAL LOW (ref 32.0–36.0)
MCV: 80.5 fL (ref 79.3–98.0)
MONO#: 0.5 10*3/uL (ref 0.1–0.9)
MONO%: 7.2 % (ref 0.0–14.0)
NEUT#: 5.4 10*3/uL (ref 1.5–6.5)
NEUT%: 76.3 % — AB (ref 39.0–75.0)
PLATELETS: 297 10*3/uL (ref 140–400)
RBC: 3.99 10*6/uL — ABNORMAL LOW (ref 4.20–5.82)
RDW: 19.1 % — AB (ref 11.0–14.6)
WBC: 7.1 10*3/uL (ref 4.0–10.3)
nRBC: 0 % (ref 0–0)

## 2014-07-14 NOTE — Progress Notes (Signed)
Pt in for aranesp injection today. HGB 10.2 pt doesn't meet parameters. Copy of lab results brought to pt and informed of no injection today.

## 2014-08-01 ENCOUNTER — Other Ambulatory Visit (HOSPITAL_COMMUNITY): Payer: Self-pay | Admitting: Internal Medicine

## 2014-08-04 ENCOUNTER — Encounter: Payer: Self-pay | Admitting: Internal Medicine

## 2014-08-04 ENCOUNTER — Ambulatory Visit (INDEPENDENT_AMBULATORY_CARE_PROVIDER_SITE_OTHER): Payer: Medicare PPO | Admitting: Internal Medicine

## 2014-08-04 ENCOUNTER — Other Ambulatory Visit (INDEPENDENT_AMBULATORY_CARE_PROVIDER_SITE_OTHER): Payer: Medicare PPO

## 2014-08-04 VITALS — BP 142/70 | HR 81 | Temp 98.7°F

## 2014-08-04 DIAGNOSIS — E785 Hyperlipidemia, unspecified: Secondary | ICD-10-CM

## 2014-08-04 DIAGNOSIS — E1121 Type 2 diabetes mellitus with diabetic nephropathy: Secondary | ICD-10-CM | POA: Diagnosis not present

## 2014-08-04 DIAGNOSIS — I10 Essential (primary) hypertension: Secondary | ICD-10-CM

## 2014-08-04 LAB — LIPID PANEL
CHOL/HDL RATIO: 3
CHOLESTEROL: 112 mg/dL (ref 0–200)
HDL: 34.4 mg/dL — ABNORMAL LOW (ref >39.00)
LDL CALC: 57 mg/dL (ref 0–99)
NonHDL: 77.6
Triglycerides: 104 mg/dL (ref 0.0–149.0)
VLDL: 20.8 mg/dL (ref 0.0–40.0)

## 2014-08-04 LAB — TSH: TSH: 5.36 u[IU]/mL — ABNORMAL HIGH (ref 0.35–4.50)

## 2014-08-04 LAB — HEMOGLOBIN A1C: Hgb A1c MFr Bld: 8.2 % — ABNORMAL HIGH (ref 4.6–6.5)

## 2014-08-04 NOTE — Progress Notes (Signed)
Subjective:    Patient ID: [name], male    DOB: Jul 05, 1932, 81 y.o.   MRN: [id number]  HPI  Here to f/u; overall doing ok,  Pt denies chest pain, increasing sob or doe, wheezing, orthopnea, PND, increased LE swelling, palpitations, dizziness or syncope.  Pt denies new neurological symptoms such as new headache, or facial or extremity weakness or numbness.  Pt denies polydipsia, polyuria, or low sugar episode.   Pt denies new neurological symptoms such as new headache, or facial or extremity weakness or numbness.   Pt states overall good compliance with meds, mostly trying to follow appropriate diet, with wt overall stable,  No new complaints. Left heel pain near resolved. LE edema no change. Sees Renal with labs recent, stable by report  Past Medical History  Diagnosis Date  . Chronic diastolic heart failure     secondary to diastolic dysfunction EF (previous EF 35-45%)ef 60%4/09  . Arrhythmia     atrial fibrillation /pt on amiodarone,thought to be poor coumadin  . Stroke   . Other and unspecified hyperlipidemia   . Hypertension   . Hypertrophy of prostate without urinary obstruction and other lower urinary tract symptoms (LUTS)   . Osteoarthrosis, unspecified whether generalized or localized, unspecified site   . Type II or unspecified type diabetes mellitus without mention of complication, not stated as uncontrolled   . AV block, Mobitz 1     Intolerance to ACE's / discontiuation of beta blockers  . Obstructive sleep apnea   . Iron deficiency anemia, unspecified     s/p EGD and coloscopy 3/10.gastritis.hemorrhids  . Cerebrovascular accident   . Renal insufficiency   . Obesity   . Allergic rhinitis, cause unspecified 05/29/2010  . CAD (coronary artery disease)     cath 12/04: mLAD 50-60%, mCFX 20-30%, pRCA 50-60%, mRCA 40%  . Normochromic normocytic anemia 04/21/2013  . Abnormal CXR 04/21/2013  . Hypertensive kidney disease with CKD stage III 04/21/2013  . Prostate cancer   . CHF  (congestive heart failure)   . COPD (chronic obstructive pulmonary disease)   . Shortness of breath dyspnea   . Pneumonia    Past Surgical History  Procedure Laterality Date  . Prostate surgery    . Prostate surgury      hx of  . Esophagogastroduodenoscopy N/A 04/21/2013    Procedure: ESOPHAGOGASTRODUODENOSCOPY (EGD);  Surgeon: Irene Shipper, MD;  Location: [facility];  Service: Endoscopy;  Laterality: N/A;    reports that he has quit smoking. He has never used smokeless tobacco. He reports that he does not drink alcohol or use illicit drugs. family history includes CAD in his sister; Dementia in his mother; Heart disease in his father. Allergies  Allergen Reactions  . Ace Inhibitors Other (See Comments)    HYPOTENSION  . Beta Adrenergic Blockers Other (See Comments)     use cautiously secondary to 2nd degree heart block, hypotension     Review of Systems  Constitutional: Negative for unusual diaphoresis or night sweats HENT: Negative for ringing in ear or discharge Eyes: Negative for double vision or worsening visual disturbance.  Respiratory: Negative for choking and stridor.   Gastrointestinal: Negative for vomiting or other signifcant bowel change Genitourinary: Negative for hematuria or change in urine volume.  Musculoskeletal: Negative for other MSK pain or swelling Skin: Negative for color change and worsening wound.  Neurological: Negative for tremors and numbness other than noted  Psychiatric/Behavioral: Negative for decreased concentration or agitation other than above  Objective:   Physical Exam BP 142/70 mmHg  Pulse 81  Temp(Src) 98.7 F (37.1 C) (Oral)  SpO2 94% VS noted,  Constitutional: Pt appears in no significant distress HENT: Head: NCAT.  Right Ear: External ear normal.  Left Ear: External ear normal.  Eyes: . Pupils are equal, round, and reactive to light. Conjunctivae and EOM are normal Neck: Normal range of motion. Neck supple.    Cardiovascular: Normal rate and regular rhythm.   Pulmonary/Chest: Effort normal and breath sounds without rales or wheezing.  Abd:  Soft, NT, ND, + BS Neurological: Pt is alert. Not confused , motor grossly intact Skin: Skin is warm. No rash, 1-2+ LE edema right > left Psychiatric: Pt behavior is normal. No agitation.     Assessment & Plan:

## 2014-08-04 NOTE — Assessment & Plan Note (Signed)
stable overall by history and exam, recent data reviewed with pt, and pt to continue medical treatment as before,  to f/u any worsening symptoms or concerns BP Readings from Last 3 Encounters:  08/04/14 142/70  07/13/14 134/60  06/22/14 122/78

## 2014-08-04 NOTE — Progress Notes (Signed)
Pre visit review using our clinic review tool, if applicable. No additional management support is needed unless otherwise documented below in the visit note. 

## 2014-08-04 NOTE — Patient Instructions (Signed)
Please continue all other medications as before, and refills have been done if requested.  Please have the pharmacy call with any other refills you may need.  Please continue your efforts at being more active, low cholesterol diet, and weight control.  You are otherwise up to date with prevention measures today.  Please keep your appointments with your specialists as you may have planned  Please go to the LAB in the Basement (turn left off the elevator) for the tests to be done today - just the A1c test today  You will be contacted by phone if any changes need to be made immediately.  Otherwise, you will receive a letter about your results with an explanation, but please check with MyChart first.  Please remember to sign up for MyChart if you have not done so, as this will be important to you in the future with finding out test results, communicating by private email, and scheduling acute appointments online when needed.  Please return in 6 months, or sooner if needed

## 2014-08-04 NOTE — Assessment & Plan Note (Signed)
stable overall by history and exam, recent data reviewed with pt, and pt to continue medical treatment as before,  to f/u any worsening symptoms or concerns Lab Results  Component Value Date   HGBA1C 7.4* 03/10/2014   For f/u a1c

## 2014-08-04 NOTE — Assessment & Plan Note (Signed)
stable overall by history and exam, recent data reviewed with pt, and pt to continue medical treatment as before,  to f/u any worsening symptoms or concerns Lab Results  Component Value Date   LDLCALC 51 07/09/2013

## 2014-08-05 ENCOUNTER — Other Ambulatory Visit: Payer: Self-pay

## 2014-08-05 ENCOUNTER — Encounter: Payer: Self-pay | Admitting: Internal Medicine

## 2014-08-05 MED ORDER — INSULIN DETEMIR 100 UNIT/ML FLEXPEN: 60.0000 [IU] | PEN_INJECTOR | Freq: Two times a day (BID) | SUBCUTANEOUS | Status: DC

## 2014-08-11 ENCOUNTER — Other Ambulatory Visit (HOSPITAL_BASED_OUTPATIENT_CLINIC_OR_DEPARTMENT_OTHER): Payer: Medicare PPO

## 2014-08-11 ENCOUNTER — Ambulatory Visit (HOSPITAL_BASED_OUTPATIENT_CLINIC_OR_DEPARTMENT_OTHER): Payer: Medicare PPO

## 2014-08-11 VITALS — BP 137/60 | HR 75 | Temp 98.2°F

## 2014-08-11 DIAGNOSIS — N189 Chronic kidney disease, unspecified: Secondary | ICD-10-CM | POA: Diagnosis not present

## 2014-08-11 DIAGNOSIS — D631 Anemia in chronic kidney disease: Secondary | ICD-10-CM

## 2014-08-11 DIAGNOSIS — N183 Chronic kidney disease, stage 3 unspecified: Secondary | ICD-10-CM

## 2014-08-11 DIAGNOSIS — D469 Myelodysplastic syndrome, unspecified: Secondary | ICD-10-CM

## 2014-08-11 DIAGNOSIS — D638 Anemia in other chronic diseases classified elsewhere: Secondary | ICD-10-CM

## 2014-08-11 LAB — CBC WITH DIFFERENTIAL/PLATELET
BASO%: 0.1 % (ref 0.0–2.0)
Basophils Absolute: 0 10*3/uL (ref 0.0–0.1)
EOS ABS: 0.4 10*3/uL (ref 0.0–0.5)
EOS%: 5.1 % (ref 0.0–7.0)
HEMATOCRIT: 30.7 % — AB (ref 38.4–49.9)
HGB: 9.7 g/dL — ABNORMAL LOW (ref 13.0–17.1)
LYMPH%: 8.4 % — ABNORMAL LOW (ref 14.0–49.0)
MCH: 25.5 pg — ABNORMAL LOW (ref 27.2–33.4)
MCHC: 31.6 g/dL — ABNORMAL LOW (ref 32.0–36.0)
MCV: 80.6 fL (ref 79.3–98.0)
MONO#: 0.8 10*3/uL (ref 0.1–0.9)
MONO%: 10.5 % (ref 0.0–14.0)
NEUT%: 75.9 % — AB (ref 39.0–75.0)
NEUTROS ABS: 6 10*3/uL (ref 1.5–6.5)
Platelets: 255 10*3/uL (ref 140–400)
RBC: 3.81 10*6/uL — AB (ref 4.20–5.82)
RDW: 19.6 % — ABNORMAL HIGH (ref 11.0–14.6)
WBC: 7.9 10*3/uL (ref 4.0–10.3)
lymph#: 0.7 10*3/uL — ABNORMAL LOW (ref 0.9–3.3)

## 2014-08-11 MED ORDER — DARBEPOETIN ALFA 300 MCG/0.6ML IJ SOSY
300.0000 ug | PREFILLED_SYRINGE | Freq: Once | INTRAMUSCULAR | Status: AC
Start: 2014-08-11 — End: 2014-08-11
  Administered 2014-08-11: 300 ug via SUBCUTANEOUS
  Filled 2014-08-11: qty 0.6

## 2014-08-17 ENCOUNTER — Other Ambulatory Visit: Payer: Self-pay | Admitting: Internal Medicine

## 2014-08-22 ENCOUNTER — Other Ambulatory Visit (HOSPITAL_COMMUNITY): Payer: Self-pay | Admitting: Internal Medicine

## 2014-08-26 ENCOUNTER — Telehealth: Payer: Self-pay | Admitting: Internal Medicine

## 2014-08-27 NOTE — Telephone Encounter (Signed)
error 

## 2014-09-03 ENCOUNTER — Telehealth: Payer: Self-pay | Admitting: Internal Medicine

## 2014-09-03 NOTE — Telephone Encounter (Signed)
Is requesting in home care for wound care for patients legs.  States legs are swelling, skin peeling, blistering and pussing.  Is requesting [facility].

## 2014-09-06 ENCOUNTER — Inpatient Hospital Stay (HOSPITAL_COMMUNITY)
Admission: EM | Admit: 2014-09-06 | Discharge: 2014-09-23 | DRG: 616 | Disposition: A | Discharge status: Skilled Nursing Facility | Payer: Medicare PPO | Attending: Family Medicine | Admitting: Family Medicine

## 2014-09-06 ENCOUNTER — Encounter (HOSPITAL_COMMUNITY): Payer: Self-pay | Admitting: Emergency Medicine

## 2014-09-06 ENCOUNTER — Emergency Department (HOSPITAL_COMMUNITY): Payer: Medicare PPO

## 2014-09-06 DIAGNOSIS — N184 Chronic kidney disease, stage 4 (severe): Secondary | ICD-10-CM | POA: Diagnosis present

## 2014-09-06 DIAGNOSIS — D469 Myelodysplastic syndrome, unspecified: Secondary | ICD-10-CM | POA: Diagnosis present

## 2014-09-06 DIAGNOSIS — L97529 Non-pressure chronic ulcer of other part of left foot with unspecified severity: Secondary | ICD-10-CM | POA: Diagnosis present

## 2014-09-06 DIAGNOSIS — D72829 Elevated white blood cell count, unspecified: Secondary | ICD-10-CM

## 2014-09-06 DIAGNOSIS — Z6832 Body mass index (BMI) 32.0-32.9, adult: Secondary | ICD-10-CM | POA: Diagnosis not present

## 2014-09-06 DIAGNOSIS — Z515 Encounter for palliative care: Secondary | ICD-10-CM | POA: Diagnosis not present

## 2014-09-06 DIAGNOSIS — I120 Hypertensive chronic kidney disease with stage 5 chronic kidney disease or end stage renal disease: Secondary | ICD-10-CM | POA: Diagnosis present

## 2014-09-06 DIAGNOSIS — J189 Pneumonia, unspecified organism: Secondary | ICD-10-CM | POA: Diagnosis not present

## 2014-09-06 DIAGNOSIS — S91309D Unspecified open wound, unspecified foot, subsequent encounter: Secondary | ICD-10-CM | POA: Diagnosis not present

## 2014-09-06 DIAGNOSIS — E11649 Type 2 diabetes mellitus with hypoglycemia without coma: Secondary | ICD-10-CM | POA: Diagnosis not present

## 2014-09-06 DIAGNOSIS — Z79899 Other long term (current) drug therapy: Secondary | ICD-10-CM

## 2014-09-06 DIAGNOSIS — Z993 Dependence on wheelchair: Secondary | ICD-10-CM

## 2014-09-06 DIAGNOSIS — N400 Enlarged prostate without lower urinary tract symptoms: Secondary | ICD-10-CM | POA: Diagnosis present

## 2014-09-06 DIAGNOSIS — Z87891 Personal history of nicotine dependence: Secondary | ICD-10-CM | POA: Diagnosis not present

## 2014-09-06 DIAGNOSIS — R509 Fever, unspecified: Secondary | ICD-10-CM

## 2014-09-06 DIAGNOSIS — G4733 Obstructive sleep apnea (adult) (pediatric): Secondary | ICD-10-CM | POA: Diagnosis present

## 2014-09-06 DIAGNOSIS — A419 Sepsis, unspecified organism: Secondary | ICD-10-CM | POA: Diagnosis not present

## 2014-09-06 DIAGNOSIS — E8770 Fluid overload, unspecified: Secondary | ICD-10-CM

## 2014-09-06 DIAGNOSIS — E669 Obesity, unspecified: Secondary | ICD-10-CM | POA: Diagnosis present

## 2014-09-06 DIAGNOSIS — D631 Anemia in chronic kidney disease: Secondary | ICD-10-CM | POA: Diagnosis present

## 2014-09-06 DIAGNOSIS — Z794 Long term (current) use of insulin: Secondary | ICD-10-CM

## 2014-09-06 DIAGNOSIS — J449 Chronic obstructive pulmonary disease, unspecified: Secondary | ICD-10-CM | POA: Diagnosis present

## 2014-09-06 DIAGNOSIS — N189 Chronic kidney disease, unspecified: Secondary | ICD-10-CM | POA: Diagnosis not present

## 2014-09-06 DIAGNOSIS — I480 Paroxysmal atrial fibrillation: Secondary | ICD-10-CM | POA: Diagnosis present

## 2014-09-06 DIAGNOSIS — N179 Acute kidney failure, unspecified: Secondary | ICD-10-CM | POA: Diagnosis not present

## 2014-09-06 DIAGNOSIS — L97219 Non-pressure chronic ulcer of right calf with unspecified severity: Secondary | ICD-10-CM | POA: Diagnosis present

## 2014-09-06 DIAGNOSIS — E1129 Type 2 diabetes mellitus with other diabetic kidney complication: Secondary | ICD-10-CM | POA: Diagnosis present

## 2014-09-06 DIAGNOSIS — M869 Osteomyelitis, unspecified: Secondary | ICD-10-CM

## 2014-09-06 DIAGNOSIS — N185 Chronic kidney disease, stage 5: Secondary | ICD-10-CM | POA: Diagnosis not present

## 2014-09-06 DIAGNOSIS — Z8546 Personal history of malignant neoplasm of prostate: Secondary | ICD-10-CM | POA: Diagnosis not present

## 2014-09-06 DIAGNOSIS — R0600 Dyspnea, unspecified: Secondary | ICD-10-CM

## 2014-09-06 DIAGNOSIS — Z66 Do not resuscitate: Secondary | ICD-10-CM | POA: Diagnosis present

## 2014-09-06 DIAGNOSIS — L97519 Non-pressure chronic ulcer of other part of right foot with unspecified severity: Secondary | ICD-10-CM | POA: Diagnosis present

## 2014-09-06 DIAGNOSIS — E039 Hypothyroidism, unspecified: Secondary | ICD-10-CM | POA: Diagnosis present

## 2014-09-06 DIAGNOSIS — M7989 Other specified soft tissue disorders: Secondary | ICD-10-CM | POA: Diagnosis present

## 2014-09-06 DIAGNOSIS — Y95 Nosocomial condition: Secondary | ICD-10-CM | POA: Diagnosis not present

## 2014-09-06 DIAGNOSIS — E1121 Type 2 diabetes mellitus with diabetic nephropathy: Secondary | ICD-10-CM | POA: Diagnosis present

## 2014-09-06 DIAGNOSIS — Z7982 Long term (current) use of aspirin: Secondary | ICD-10-CM | POA: Diagnosis not present

## 2014-09-06 DIAGNOSIS — D649 Anemia, unspecified: Secondary | ICD-10-CM | POA: Diagnosis present

## 2014-09-06 DIAGNOSIS — E1152 Type 2 diabetes mellitus with diabetic peripheral angiopathy with gangrene: Secondary | ICD-10-CM | POA: Diagnosis present

## 2014-09-06 DIAGNOSIS — I5033 Acute on chronic diastolic (congestive) heart failure: Secondary | ICD-10-CM | POA: Diagnosis not present

## 2014-09-06 DIAGNOSIS — E785 Hyperlipidemia, unspecified: Secondary | ICD-10-CM | POA: Diagnosis present

## 2014-09-06 DIAGNOSIS — Z8673 Personal history of transient ischemic attack (TIA), and cerebral infarction without residual deficits: Secondary | ICD-10-CM

## 2014-09-06 DIAGNOSIS — R609 Edema, unspecified: Secondary | ICD-10-CM

## 2014-09-06 DIAGNOSIS — E11621 Type 2 diabetes mellitus with foot ulcer: Secondary | ICD-10-CM | POA: Diagnosis present

## 2014-09-06 DIAGNOSIS — R739 Hyperglycemia, unspecified: Secondary | ICD-10-CM

## 2014-09-06 DIAGNOSIS — I2510 Atherosclerotic heart disease of native coronary artery without angina pectoris: Secondary | ICD-10-CM | POA: Diagnosis present

## 2014-09-06 DIAGNOSIS — L97509 Non-pressure chronic ulcer of other part of unspecified foot with unspecified severity: Secondary | ICD-10-CM

## 2014-09-06 DIAGNOSIS — S91309A Unspecified open wound, unspecified foot, initial encounter: Secondary | ICD-10-CM

## 2014-09-06 LAB — CBG MONITORING, ED
Glucose-Capillary: 322 mg/dL — ABNORMAL HIGH (ref 65–99)
Glucose-Capillary: 302 mg/dL — ABNORMAL HIGH (ref 65–99)

## 2014-09-06 LAB — CBC WITH DIFFERENTIAL/PLATELET
BASOS ABS: 0 10*3/uL (ref 0.0–0.1)
BASOS PCT: 0 % (ref 0–1)
EOS PCT: 2 % (ref 0–5)
Eosinophils Absolute: 0.2 10*3/uL (ref 0.0–0.7)
HEMATOCRIT: 30.6 % — AB (ref 39.0–52.0)
HEMOGLOBIN: 9.4 g/dL — AB (ref 13.0–17.0)
LYMPHS ABS: 0.9 10*3/uL (ref 0.7–4.0)
Lymphocytes Relative: 6 % — ABNORMAL LOW (ref 12–46)
MCH: 25.2 pg — ABNORMAL LOW (ref 26.0–34.0)
MCHC: 30.7 g/dL (ref 30.0–36.0)
MCV: 82 fL (ref 78.0–100.0)
MONO ABS: 0.7 10*3/uL (ref 0.1–1.0)
Monocytes Relative: 5 % (ref 3–12)
Neutro Abs: 12.6 10*3/uL — ABNORMAL HIGH (ref 1.7–7.7)
Neutrophils Relative %: 87 % — ABNORMAL HIGH (ref 43–77)
PLATELETS: 387 10*3/uL (ref 150–400)
RBC: 3.73 MIL/uL — AB (ref 4.22–5.81)
RDW: 19.2 % — ABNORMAL HIGH (ref 11.5–15.5)
WBC: 14.4 10*3/uL — AB (ref 4.0–10.5)

## 2014-09-06 LAB — COMPREHENSIVE METABOLIC PANEL
ALK PHOS: 166 U/L — AB (ref 38–126)
ALT: 24 U/L (ref 17–63)
AST: 25 U/L (ref 15–41)
Albumin: 2.7 g/dL — ABNORMAL LOW (ref 3.5–5.0)
Anion gap: 9 (ref 5–15)
BUN: 87 mg/dL — AB (ref 6–20)
CO2: 18 mmol/L — ABNORMAL LOW (ref 22–32)
CREATININE: 3.49 mg/dL — AB (ref 0.61–1.24)
Calcium: 8.5 mg/dL — ABNORMAL LOW (ref 8.9–10.3)
Chloride: 108 mmol/L (ref 101–111)
GFR calc Af Amer: 18 mL/min — ABNORMAL LOW (ref >60)
GFR calc non Af Amer: 15 mL/min — ABNORMAL LOW (ref >60)
Glucose, Bld: 345 mg/dL — ABNORMAL HIGH (ref 65–99)
Potassium: 4.9 mmol/L (ref 3.5–5.1)
Sodium: 135 mmol/L (ref 135–145)
Total Bilirubin: 0.3 mg/dL (ref 0.3–1.2)
Total Protein: 7.8 g/dL (ref 6.5–8.1)

## 2014-09-06 LAB — GLUCOSE, CAPILLARY
GLUCOSE-CAPILLARY: 265 mg/dL — AB (ref 65–99)
Glucose-Capillary: 243 mg/dL — ABNORMAL HIGH (ref 65–99)

## 2014-09-06 LAB — SEDIMENTATION RATE: SED RATE: 122 mm/h — AB (ref 0–16)

## 2014-09-06 MED ORDER — ASPIRIN EC 81 MG PO TBEC
81.0000 mg | DELAYED_RELEASE_TABLET | Freq: Every day | ORAL | Status: DC
  Administered 2014-09-07 – 2014-09-23 (×16): 81 mg via ORAL
  Filled 2014-09-06 (×19): qty 1

## 2014-09-06 MED ORDER — HEPARIN SODIUM (PORCINE) 5000 UNIT/ML IJ SOLN
5000.0000 [IU] | Freq: Three times a day (TID) | INTRAMUSCULAR | Status: DC
  Administered 2014-09-06 – 2014-09-23 (×48): 5000 [IU] via SUBCUTANEOUS
  Filled 2014-09-06 (×44): qty 1

## 2014-09-06 MED ORDER — ATORVASTATIN CALCIUM 40 MG PO TABS
40.0000 mg | ORAL_TABLET | Freq: Every day | ORAL | Status: DC
  Administered 2014-09-06 – 2014-09-23 (×17): 40 mg via ORAL
  Filled 2014-09-06 (×19): qty 1

## 2014-09-06 MED ORDER — INSULIN DETEMIR 100 UNIT/ML ~~LOC~~ SOLN
120.0000 [IU] | Freq: Every day | SUBCUTANEOUS | Status: DC
  Administered 2014-09-07 – 2014-09-08 (×2): 120 [IU] via SUBCUTANEOUS
  Filled 2014-09-06 (×4): qty 1.2

## 2014-09-06 MED ORDER — OXYCODONE-ACETAMINOPHEN 5-325 MG PO TABS
1.0000 | ORAL_TABLET | Freq: Once | ORAL | Status: AC
  Administered 2014-09-06: 1 via ORAL
  Filled 2014-09-06: qty 1

## 2014-09-06 MED ORDER — INSULIN DETEMIR 100 UNIT/ML ~~LOC~~ SOLN
120.0000 [IU] | Freq: Every day | SUBCUTANEOUS | Status: DC
  Filled 2014-09-06: qty 1.2

## 2014-09-06 MED ORDER — TORSEMIDE 20 MG PO TABS
40.0000 mg | ORAL_TABLET | Freq: Two times a day (BID) | ORAL | Status: DC
  Administered 2014-09-06 – 2014-09-15 (×16): 40 mg via ORAL
  Filled 2014-09-06 (×20): qty 2

## 2014-09-06 MED ORDER — INSULIN ASPART 100 UNIT/ML ~~LOC~~ SOLN: 0.0000 [IU] | Freq: Three times a day (TID) | SUBCUTANEOUS | Status: DC

## 2014-09-06 MED ORDER — ISOSORBIDE MONONITRATE ER 60 MG PO TB24
60.0000 mg | ORAL_TABLET | Freq: Every day | ORAL | Status: DC
  Administered 2014-09-07 – 2014-09-23 (×17): 60 mg via ORAL
  Filled 2014-09-06 (×17): qty 1

## 2014-09-06 MED ORDER — ALLOPURINOL 100 MG PO TABS
100.0000 mg | ORAL_TABLET | Freq: Every day | ORAL | Status: DC
  Administered 2014-09-06 – 2014-09-22 (×16): 100 mg via ORAL
  Filled 2014-09-06 (×17): qty 1

## 2014-09-06 MED ORDER — LEVOTHYROXINE SODIUM 125 MCG PO TABS
125.0000 ug | ORAL_TABLET | Freq: Every day | ORAL | Status: DC
  Administered 2014-09-07 – 2014-09-23 (×16): 125 ug via ORAL
  Filled 2014-09-06 (×17): qty 1

## 2014-09-06 MED ORDER — AMIODARONE HCL 100 MG PO TABS
100.0000 mg | ORAL_TABLET | Freq: Every day | ORAL | Status: DC
  Administered 2014-09-07 – 2014-09-23 (×17): 100 mg via ORAL
  Filled 2014-09-06 (×17): qty 1

## 2014-09-06 MED ORDER — ONDANSETRON HCL 4 MG/2ML IJ SOLN: 4.0000 mg | Freq: Four times a day (QID) | INTRAMUSCULAR | Status: DC | PRN

## 2014-09-06 MED ORDER — SODIUM CHLORIDE 0.9 % IV SOLN
INTRAVENOUS | Status: DC
  Administered 2014-09-13: 01:00:00 via INTRAVENOUS

## 2014-09-06 MED ORDER — SODIUM CHLORIDE 0.9 % IJ SOLN
3.0000 mL | Freq: Two times a day (BID) | INTRAMUSCULAR | Status: DC
  Administered 2014-09-06 – 2014-09-20 (×10): 3 mL via INTRAVENOUS

## 2014-09-06 MED ORDER — POTASSIUM CHLORIDE CRYS ER 20 MEQ PO TBCR
20.0000 meq | EXTENDED_RELEASE_TABLET | Freq: Two times a day (BID) | ORAL | Status: DC
  Administered 2014-09-06: 20 meq via ORAL
  Filled 2014-09-06: qty 1

## 2014-09-06 MED ORDER — SODIUM CHLORIDE 0.9 % IV SOLN
250.0000 mL | INTRAVENOUS | Status: DC | PRN
  Administered 2014-09-13: 18:00:00 via INTRAVENOUS

## 2014-09-06 MED ORDER — SODIUM CHLORIDE 0.9 % IJ SOLN: 3.0000 mL | INTRAMUSCULAR | Status: DC | PRN

## 2014-09-06 MED ORDER — CITALOPRAM HYDROBROMIDE 20 MG PO TABS
20.0000 mg | ORAL_TABLET | Freq: Every day | ORAL | Status: DC
  Administered 2014-09-07 – 2014-09-23 (×16): 20 mg via ORAL
  Filled 2014-09-06 (×17): qty 1

## 2014-09-06 MED ORDER — HYDRALAZINE HCL 50 MG PO TABS
100.0000 mg | ORAL_TABLET | Freq: Three times a day (TID) | ORAL | Status: DC
  Administered 2014-09-06 – 2014-09-23 (×46): 100 mg via ORAL
  Filled 2014-09-06 (×49): qty 2

## 2014-09-06 MED ORDER — PANTOPRAZOLE SODIUM 40 MG PO TBEC: 40.0000 mg | DELAYED_RELEASE_TABLET | Freq: Every day | ORAL | Status: DC

## 2014-09-06 MED ORDER — INSULIN ASPART 100 UNIT/ML ~~LOC~~ SOLN
0.0000 [IU] | Freq: Three times a day (TID) | SUBCUTANEOUS | Status: DC
  Administered 2014-09-06: 3 [IU] via SUBCUTANEOUS
  Administered 2014-09-07 – 2014-09-08 (×3): 2 [IU] via SUBCUTANEOUS
  Administered 2014-09-08: 1 [IU] via SUBCUTANEOUS
  Administered 2014-09-08: 3 [IU] via SUBCUTANEOUS
  Administered 2014-09-09: 1 [IU] via SUBCUTANEOUS
  Administered 2014-09-09: 2 [IU] via SUBCUTANEOUS
  Administered 2014-09-10: 1 [IU] via SUBCUTANEOUS
  Administered 2014-09-11: 2 [IU] via SUBCUTANEOUS
  Administered 2014-09-11: 1 [IU] via SUBCUTANEOUS
  Administered 2014-09-12 (×2): 2 [IU] via SUBCUTANEOUS
  Administered 2014-09-12: 1 [IU] via SUBCUTANEOUS
  Administered 2014-09-14 – 2014-09-16 (×8): 2 [IU] via SUBCUTANEOUS
  Administered 2014-09-16: 3 [IU] via SUBCUTANEOUS
  Administered 2014-09-17 – 2014-09-23 (×7): 1 [IU] via SUBCUTANEOUS

## 2014-09-06 MED ORDER — AMLODIPINE BESYLATE 10 MG PO TABS
10.0000 mg | ORAL_TABLET | Freq: Every day | ORAL | Status: DC
  Administered 2014-09-07 – 2014-09-23 (×15): 10 mg via ORAL
  Filled 2014-09-06 (×16): qty 1

## 2014-09-06 MED ORDER — INSULIN DETEMIR 100 UNIT/ML ~~LOC~~ SOLN
120.0000 [IU] | SUBCUTANEOUS | Status: DC
  Filled 2014-09-06: qty 1.2

## 2014-09-06 MED ORDER — PANTOPRAZOLE SODIUM 40 MG PO TBEC
40.0000 mg | DELAYED_RELEASE_TABLET | Freq: Every day | ORAL | Status: DC
  Administered 2014-09-07 – 2014-09-23 (×16): 40 mg via ORAL
  Filled 2014-09-06 (×15): qty 1

## 2014-09-06 MED ORDER — ONDANSETRON HCL 4 MG PO TABS: 4.0000 mg | ORAL_TABLET | Freq: Four times a day (QID) | ORAL | Status: DC | PRN

## 2014-09-06 MED ORDER — CLONIDINE HCL 0.1 MG/24HR TD PTWK
0.1000 mg | MEDICATED_PATCH | TRANSDERMAL | Status: DC
  Administered 2014-09-06: 0.1 mg via TRANSDERMAL
  Filled 2014-09-06: qty 1

## 2014-09-06 MED ORDER — HYDROCODONE-ACETAMINOPHEN 5-325 MG PO TABS
1.0000 | ORAL_TABLET | ORAL | Status: DC | PRN
  Administered 2014-09-06: 2 via ORAL
  Administered 2014-09-06: 1 via ORAL
  Administered 2014-09-07: 2 via ORAL
  Filled 2014-09-06 (×3): qty 2

## 2014-09-06 MED ORDER — INSULIN ASPART 100 UNIT/ML ~~LOC~~ SOLN
5.0000 [IU] | Freq: Once | SUBCUTANEOUS | Status: AC
  Administered 2014-09-06: 5 [IU] via SUBCUTANEOUS
  Filled 2014-09-06: qty 1

## 2014-09-06 NOTE — Telephone Encounter (Signed)
Pt currently hospd for same, I am confident will be addressed at time of d/c home

## 2014-09-06 NOTE — Clinical Social Work Note (Signed)
Clinical Social Work Assessment  Patient Details  Name: [name] MRN: [id number] Date of Birth: [birth year]  Date of referral:  09/06/14               Reason for consult:  Facility Placement                Permission sought to share information with:  Family Supports Permission granted to share information::  Yes, Verbal Permission Granted  Name::     Travis and [name]::  [facility]   Relationship::  son in Sports coach and daughter  Contact Information:     Housing/Transportation Living arrangements for the past 2 months:  Single Family Home Source of Information:  Patient, Other (Comment Required), Adult Children Patient Interpreter Needed:  None Criminal Activity/Legal Involvement Pertinent to Current Situation/Hospitalization:  No - Comment as needed Significant Relationships:    Lives with:    Do you feel safe going back to the place where you live?    Need for family participation in patient care:     Care giving concerns:  Patient currently lives alone, is in motorized wheelchair, and is having difficulty with adls along with needing woudn care for right leg draining and bilateral swelling of lower extremities   Social Worker assessment / plan:  CSW met with pt at bedside to complete psychosocial assessment. Pt shared he currently lives at home however is unable to continue to care for himself and has limited family support. Pt son in law, [name] is at bedside. Pt daughter [name]. Patient shares taht he is interested in skilled nursing facility placement for wound care and physical therapy due to decline in completing ADL's and inability to care for wounds. Pt shares that family reached out to PCP to assist with home health services, however his PCP was out of the office and orders would have to wait until Tuesday 09/07/2014 when the PCP returned to the office. Patient shares he is interested in any facility besides [facility], with an interest in  [facility].   CSW to assist with snf placement from the ED or medical floor as patient is medically stable. Pt pending further medical evaluation.   Employment status:  Retired Nurse, adult PT Recommendations:  Not assessed at this time Information / Referral to community resources:     Patient/Family's Response to care:  Patient and patient family thanked csw for concern and support and assisting with skilled nursing placement  oncwe medically stable.  Patient/Family's Understanding of and Emotional Response to Diagnosis, Current Treatment, and Prognosis:  Patient and patient son in law verbalize udnerstanding of further medical evaluation needed for disposition, as well as potential plan for disposition from the ED. Pt emotional response is of relief as patient voiced concern over not being able to help himself and only having limited help from family at this time.   Emotional Assessment Appearance:    Attitude/Demeanor/Rapport:   (calm and content) Affect (typically observed):  Accepting Orientation:    Alcohol / Substance use:  Not Applicable Psych involvement (Current and /or in the community):  No (Comment)  Discharge Needs  Concerns to be addressed:  Basic Needs, Medication Concerns Readmission within the last 30 days:  No Current discharge risk:  None Barriers to Discharge:  No Barriers Identified   [name], [place], LCSW 09/06/2014, 9:30 AM

## 2014-09-06 NOTE — Care Management Note (Addendum)
Case Management Note  Patient Details  Name: [name] MRN: [id number] Date of Birth: 10-Jun-1932  Subjective/Objective:          81 yr old [place] resident who confirms with CM he lives alone at home with assist of [place] personal care services [facility]) staff via Caroleen Hamman twice a day, in the morning to assist with ADLs. Pt confirms he has not been able to stand lately but uses his w/c for mobility. Reports a second [place] staff arrives at night to assist with getting in his recliner where he sleeps as preferred by pt after hx of CHF.  When Cm voiced concern about pt feeling he is unable to rest lying down vs his recliner, pt confirms he is taking his medicines correctly and assisted by his daughter to do this.  Pt pays for [facility] out of pocket.  Son confirms there will be available PCS staff to be with pt until facility placement is secured. Son has FL2 from ED SW.        Action/Plan:  1110 Cm consulted by ED SW to assist with home health for pt pending facility placement from home CM and Sw spoke with pt and son CM assessed pt needs Cm reviewed medicare guidelines 1130 Cm and SW spoke with pt and son again after consulting EDP, [place] who consulted Nephrologist and admission is not needed per Nephrologist, Pt can be seen in 1 week for outpatient lab work [place] spoke with [place] staff and Gwenyth Bouillon at [place] to provide [facility] referral for [facility], [place] and [place]  1240 Cm spoke with Programmer, systems covering for [employer] RN (on vacation) She will see pt to make recommendations for wound care at home CM updated [place] and will provide recommendations to [place] when available  Contact number for [facility] given to pt/son  1454 ED CM updated Edwina of [facility] and ED SW that pt is being evaluated for admission after wound care RN Dawn evaluated him   Expected Discharge Date:    September 06 2014              Expected Discharge Plan:   home with home health  In-House Referral:   ED  SW  Discharge planning Services   home with home health services   Post Acute Care Choice:    home with home health services  Choice offered to:   pt and son  DME Arranged:   na DME Agency:   na  HH Arranged:   hhrn, pt, sw HH Agency:   [facility]  Status of Service:   completed     Additional Comments:  Robbie Lis, RN 09/06/2014, 11:41 AM

## 2014-09-06 NOTE — ED Notes (Signed)
Wound specialist at bedside

## 2014-09-06 NOTE — ED Notes (Signed)
Pt comes in for swelling in bilat legs that has gotten worse over the past week or so.  Pt having draining in right foot.  Pt lives alone with motorized wheelchair and assistance will come help him during the day.

## 2014-09-06 NOTE — H&P (Signed)
PCP:   [name], MD   Chief Complaint: Leg swelling   HPI:  81 yr old male who   has a past medical history of Chronic diastolic heart failure; Arrhythmia; Stroke; Other and unspecified hyperlipidemia; Hypertension; Hypertrophy of prostate without urinary obstruction and other lower urinary tract symptoms (LUTS); Osteoarthrosis, unspecified whether generalized or localized, unspecified site; Type II or unspecified type diabetes mellitus without mention of complication, not stated as uncontrolled; AV block, Mobitz 1; Obstructive sleep apnea; Iron deficiency anemia, unspecified; Cerebrovascular accident; Renal insufficiency; Obesity; Allergic rhinitis, cause unspecified (05/29/2010); CAD (coronary artery disease); Normochromic normocytic anemia (04/21/2013); Abnormal CXR (04/21/2013); Hypertensive kidney disease with CKD stage III (04/21/2013); Prostate cancer; CHF (congestive heart failure); COPD (chronic obstructive pulmonary disease); Shortness of breath dyspnea; and Pneumonia. is presenting for worsening chronic edema and difficulty ambulating due to pain. Worsening pain in left and right lower legs has been constantly aching for approximately 1 year and is relieved by rest and worsened by activity. Associated symptoms include fatigue and swelling. He has chronic lower extremity at baseline but it is worsening and family reports the "meat is falling off" from his legs. No fever.   Due to his independent living situation, he has a difficult time caring for himself and (per son-in-law at bedside) has a nurse from [place] making visits to his house twice a day.   Allergies:   Allergies  Allergen Reactions  . Ace Inhibitors Other (See Comments)    HYPOTENSION  . Beta Adrenergic Blockers Other (See Comments)     use cautiously secondary to 2nd degree heart block, hypotension      Past Medical History  Diagnosis Date  . Chronic diastolic heart failure     secondary to diastolic  dysfunction EF (previous EF 35-45%)ef 60%4/09  . Arrhythmia     atrial fibrillation /pt on amiodarone,thought to be poor coumadin  . Stroke   . Other and unspecified hyperlipidemia   . Hypertension   . Hypertrophy of prostate without urinary obstruction and other lower urinary tract symptoms (LUTS)   . Osteoarthrosis, unspecified whether generalized or localized, unspecified site   . Type II or unspecified type diabetes mellitus without mention of complication, not stated as uncontrolled   . AV block, Mobitz 1     Intolerance to ACE's / discontiuation of beta blockers  . Obstructive sleep apnea   . Iron deficiency anemia, unspecified     s/p EGD and coloscopy 3/10.gastritis.hemorrhids  . Cerebrovascular accident   . Renal insufficiency   . Obesity   . Allergic rhinitis, cause unspecified 05/29/2010  . CAD (coronary artery disease)     cath 12/04: mLAD 50-60%, mCFX 20-30%, pRCA 50-60%, mRCA 40%  . Normochromic normocytic anemia 04/21/2013  . Abnormal CXR 04/21/2013  . Hypertensive kidney disease with CKD stage III 04/21/2013  . Prostate cancer   . CHF (congestive heart failure)   . COPD (chronic obstructive pulmonary disease)   . Shortness of breath dyspnea   . Pneumonia     Past Surgical History  Procedure Laterality Date  . Prostate surgery    . Prostate surgury      hx of  . Esophagogastroduodenoscopy N/A 04/21/2013    Procedure: ESOPHAGOGASTRODUODENOSCOPY (EGD);  Surgeon: Irene Shipper, MD;  Location: [facility];  Service: Endoscopy;  Laterality: N/A;    Prior to Admission medications   Medication Sig Start Date End Date Taking? Authorizing Provider  ACCU-CHEK AVIVA PLUS test strip USE AS DIRECTED TWICE DAILY  TO CHECK BLOOD SUGAR 08/17/14  Yes Biagio Borg, MD  allopurinol (ZYLOPRIM) 100 MG tablet Take 1 tablet (100 mg total) by mouth at bedtime. 12/30/13  Yes Biagio Borg, MD  amiodarone (PACERONE) 200 MG tablet Take 0.5 tablets (100 mg total) by mouth every morning. 03/19/14  Yes  Shaune Pascal Bensimhon, MD  amLODipine (NORVASC) 10 MG tablet TAKE 1 TABLET BY MOUTH EVERY DAY 06/21/14  Yes Jolaine Artist, MD  aspirin EC 81 MG EC tablet Take 1 tablet (81 mg total) by mouth daily. 03/13/14  Yes Brett Canales, PA-C  atorvastatin (LIPITOR) 40 MG tablet TAKE 1 TABLET BY MOUTH EVERY DAY 08/02/14  Yes Jolaine Artist, MD  B-D ULTRAFINE III SHORT PEN 31G X 8 MM MISC  03/14/14  Yes Historical Provider, MD  citalopram (CELEXA) 10 MG tablet Take 20 mg by mouth daily.  07/29/12  Yes Biagio Borg, MD  cloNIDine (CATAPRES - DOSED IN MG/24 HR) 0.1 mg/24hr patch Place 1 patch (0.1 mg total) onto the skin once a week. 03/23/14  Yes Jolaine Artist, MD  diclofenac sodium (VOLTAREN) 1 % GEL Apply 2 g topically 2 (two) times daily. To affected joint. 07/13/14  Yes Lyndal Pulley, DO  Fluticasone-Salmeterol (ADVAIR) 100-50 MCG/DOSE AEPB Inhale 1 puff into the lungs as needed (for shortness of breath).  06/05/12  Yes Renato Shin, MD  glucose blood (ONE TOUCH ULTRA TEST) test strip 1 each by Other route 3 (three) times daily. Use as directed three times daily to check blood sugar.  Diagnosis code E11.29 03/02/14  Yes Biagio Borg, MD  hydrALAZINE (APRESOLINE) 100 MG tablet Take 100 mg by mouth 3 (three) times daily. 01/13/14  Yes Historical Provider, MD  Insulin Detemir (LEVEMIR) 100 UNIT/ML Pen Inject 60 Units into the skin 2 (two) times daily. Patient taking differently: Inject 120 Units into the skin every morning.  08/05/14  Yes Biagio Borg, MD  isosorbide mononitrate (IMDUR) 60 MG 24 hr tablet Take 1 tablet (60 mg total) by mouth daily. 03/13/14  Yes Brett Canales, PA-C  isosorbide mononitrate (IMDUR) 60 MG 24 hr tablet TAKE 1 TABLET (60 MG TOTAL) BY MOUTH DAILY. 08/24/14  Yes Jolaine Artist, MD  levothyroxine (SYNTHROID, LEVOTHROID) 125 MCG tablet Take 125 mcg by mouth every morning. 12/26/13  Yes Historical Provider, MD  neomycin-bacitracin-polymyxin (NEOSPORIN) OINT Apply 1 application topically  daily.   Yes Historical Provider, MD  omeprazole (PRILOSEC) 20 MG capsule Take 1 capsule (20 mg total) by mouth daily. 04/05/14  Yes Jolaine Artist, MD  potassium chloride SA (K-DUR,KLOR-CON) 20 MEQ tablet Take 1 tablet (20 mEq total) by mouth 2 (two) times daily. Patient taking differently: Take 20 mEq by mouth daily. Just takes in the morning 03/25/14  Yes Amy D Clegg, NP  silver sulfADIAZINE (SILVADENE) 1 % cream Apply topically daily. Topical, Daily, For 21 days Apply to Bilateral LE ulcerations twice daily for three days (Wednesday through Friday) then decrease dressing changes to daily and continue for 21 days. Patient taking differently: Apply topically daily. Apply daily as needed 10/26/13  Yes Modena Jansky, MD  torsemide (DEMADEX) 20 MG tablet Take 2 tablets (40 mg total) by mouth 2 (two) times daily. 05/03/14  Yes Amy D Clegg, NP  traMADol (ULTRAM) 50 MG tablet Take 1 tablet (50 mg total) by mouth every 8 (eight) hours as needed. 06/22/14  Yes Biagio Borg, MD  TRIGELS-F FORTE 460-60-0.01-1 MG CAPS capsule TAKE  1 CAPSULE BY MOUTH DAILY. 02/22/14  Yes Biagio Borg, MD    Social History:  reports that he has quit smoking. He has never used smokeless tobacco. He reports that he does not drink alcohol or use illicit drugs.  Family History  Problem Relation Age of Onset  . Heart disease Father   . Dementia Mother   . CAD Sister      All the positives are listed in BOLD  Review of Systems:  HEENT: Negative for: Headache, blurred vision, runny nose, sore throat Neck: Negative for: Hypothyroidism, hyperthyroidism, lymphadenopathy Chest : Negative for: Shortness of breath, Asthma; Positive for history of COPD Heart : Negative for: Chest pain, history of coronary artery disease GI:  Negative for: Nausea, vomiting, diarrhea, constipation, GERD GU: Negative for: Dysuria, urgency, frequency of urination, hematuria Neuro: Negative for: Stroke, seizures, syncope Psych: Negative for:  Depression, anxiety, hallucinations   Physical Exam: Blood pressure 156/74, pulse 85, temperature 98.1 F (36.7 C), temperature source Oral, resp. rate 24, SpO2 92 %. Constitutional:   Patient is an ill-appearing elderly male, in no acute distress and cooperative with exam. Head: Normocephalic and atraumatic Mouth: Mucus membranes moist Eyes: PERRL, EOMI, conjunctivae normal Neck: Supple, No Thyromegaly Cardiovascular: S1 normal, S2 normal Pulmonary/Chest: CTAB, no wheezes, rales, or rhonchi Abdominal: Soft. Non-tender, bowel sounds are normal, no masses, organomegaly, or guarding present.  Neurological: A&O x3, Strength is normal and symmetric bilaterally, cranial nerve II-XII are grossly intact, no focal motor deficit, sensory intact to light touch bilaterally.  Extremities: Dressing recently changed [in ED] - applied to open areas on right foot, right great toe, and left leg; did not take off dressing during exam   Labs on Admission:  Basic Metabolic Panel:  Recent Labs Lab 09/06/14 0904  NA 135  K 4.9  CL 108  CO2 18*  GLUCOSE 345*  BUN 87*  CREATININE 3.49*  CALCIUM 8.5*   Liver Function Tests:  Recent Labs Lab 09/06/14 0904  AST 25  ALT 24  ALKPHOS 166*  BILITOT 0.3  PROT 7.8  ALBUMIN 2.7*   No results for input(s): LIPASE, AMYLASE in the last 168 hours. No results for input(s): AMMONIA in the last 168 hours. CBC:  Recent Labs Lab 09/06/14 0904  WBC 14.4*  NEUTROABS 12.6*  HGB 9.4*  HCT 30.6*  MCV 82.0  PLT 387   Cardiac Enzymes: No results for input(s): CKTOTAL, CKMB, CKMBINDEX, TROPONINI in the last 168 hours.  BNP (last 3 results)  Recent Labs  03/10/14 1039 04/28/14 1530  BNP 456.3* 153.7*    ProBNP (last 3 results)  Recent Labs  10/21/13 2012 11/28/13 1215 01/27/14 1709  PROBNP 1541.0* 1597.0* 2020.0*    CBG:  Recent Labs Lab 09/06/14 1101 09/06/14 1226  GLUCAP 322* 302*    Radiological Exams on Admission: Dg Chest  Portable 1 View  09/06/2014   CLINICAL DATA:  Lower extremity weakness  EXAM: PORTABLE CHEST - 1 VIEW  COMPARISON:  March 10, 2014  FINDINGS: There is scarring in both mid lung regions. There is no frank edema or consolidation. Heart is mildly enlarged with pulmonary vascular within normal limits. There is atherosclerotic change in aorta. The bony structures appear stable compared to prior study without focal lesion. Several bony structures appear rather sclerotic a particularly in the medial proximal right humerus, incompletely visualized on this study.  IMPRESSION: Areas of lung scarring. No frank edema or consolidation. Several bones appear somewhat sclerotic. This finding may warrant prostate evaluation  given the propensity of prostate carcinoma to present with sclerotic bony metastases.   Electronically Signed   By: Lowella Grip III M.D.   On: 09/06/2014 09:20   Dg Foot Complete Right  09/06/2014   CLINICAL DATA:  Right foot pain, wound.  EXAM: RIGHT FOOT COMPLETE - 3+ VIEW  COMPARISON:  None.  FINDINGS: Soft tissue swelling within the right foot. No acute bony abnormality. No acute fracture, subluxation or dislocation. Possible old healed fractures in the proximal second and third metatarsals. No soft tissue gas.  IMPRESSION: No acute bony abnormality.   Electronically Signed   By: Rolm Baptise M.D.   On: 09/06/2014 14:55    EKG: Independently reviewed.    Assessment/Plan   Wound, open, foot:  - Patient has chronic bilateral lower extremity sores, likely due to diabetes mellitus  - Wound care has been consulted and has seen the patient at bedside.  -  Ortho consulted for debridement  - Norco 5-325 mg for pain  Type 2 diabetes mellitus with renal manifestations  - CBGs are elevated in the 300's  - continue to monitor glucose  - Started Sliding scale insulin with Novolog    Myelodysplastic syndrome-hx of transfusions  -hemoglobin is 9.4  -follow cbc in  am      Hypothyroid  - on synthroid 125 mcg  History of prostate cancer-15 yrs ago  Essential hypertension  - on hydralazine 100 mg tablet  - clonidine patch 0.1 mg  Dyslipidemia  - on lipitor   CAD, moderate  - atorvastatin, amlodipin  Code status: DNR  Family discussion: Admission, patients condition and plan of care including tests being ordered have been discussed with the patient and son-in-law who indicate understanding and agree with the plan and Code Status.   Time Spent on Admission:  60 min  [facility] Pager: [phone] 09/06/2014, 3:10 PM  If 7PM-7AM, please contact night-coverage  [email or web address]  Password [email or web address]

## 2014-09-06 NOTE — ED Notes (Signed)
Wound care at bedside.

## 2014-09-06 NOTE — Consult Note (Addendum)
WOC wound consult note Reason for Consult: Requested to assess BLE in the ER and provide dressing change orders for home health agency for planned discharge today.  Pt states "he has worn leg wraps in the past" to resolve edema, but this was not recently.  A few weeks ago, he states his legs began to swell and blister, then leak a large amt fluid.  They are very painful to touch and he cannot walk.   Wound type: Left leg with generalized edema, partial thickness wound near ankle 2X1X.1cm, small amt yellow drainage, no odor, yellow wound bed with dry crusted skin surrounding.  Skin appearance consistent with venous stasis changes. Right leg with large amt yellow drainage and strong foul odor, generalized edema.  Plantar foot fluctuant with large amt loose skin and fluid trapped underneath, removes easily with scissors.   Measurement: Full thickness wound which is revealed is 12X20cm and spans plantar foot and across inner upper arch.  Woundbed is grey and macerated with loose skin, large amt yellow drainage, and fluctuance to middle.  Necrotic areas now revealed to middle of wound near arch; 2X2X1cm, dark brown and mod amt odor.  Nectrotic area also revealed to outer plantar foot with strong four odor, 5X6cm, 100% loose eschar. Right great toe with necrotic area to tip, .5X.5X.3cm, probed to bone, mod amt tan drainage and strong odor. Dressing procedure/placement/frequency: Recommend ortho consult R/T necrotic wounds on right foot.  Discussed plan of care with ER physician and patient and family member at bedside.  Applied kerlex and ace wrap for light compression to left leg and moist gauze with same light compression to right leg until further input provided by ortho service.  X-rays pending. One hour spent on this consult. Please re-consult if further assistance is needed.  Thank-you,  Julien Girt MSN, [place], [place], [place], [place]

## 2014-09-06 NOTE — ED Provider Notes (Signed)
After wound eval, concern for possible osteomyelitis Right foot xray ordered D/w dr Darrick Meigs, will admit and he will start ABX  Ripley Fraise, MD 09/06/14 [phone]

## 2014-09-06 NOTE — Clinical Social Work Placement (Signed)
   CLINICAL SOCIAL WORK PLACEMENT  NOTE  Date:  09/06/2014  Patient Details  Name: [name] MRN: [id number] Date of Birth: 03/27/1932  Clinical Social Work is seeking post-discharge placement for this patient at the [place] level of care (*CSW will initial, date and re-position this form in  chart as items are completed):  Yes   Patient/family provided with [place] Work Department's list of facilities offering this level of care within the geographic area requested by the patient (or if unable, by the patient's family).  Yes   Patient/family informed of their freedom to choose among providers that offer the needed level of care, that participate in Medicare, Medicaid or managed care program needed by the patient, have an available bed and are willing to accept the patient.  Yes   Patient/family informed of [facility]'s ownership interest in [facility] and [facility], as well as of the fact that they are under no obligation to receive care at these facilities.  PASRR submitted to EDS on 09/06/14     PASRR number received on       Existing PASRR number confirmed on 09/06/14     FL2 transmitted to all facilities in geographic area requested by pt/family on       FL2 transmitted to all facilities within larger geographic area on 09/06/14     Patient informed that his/her managed care company has contracts with or will negotiate with certain facilities, including the following:            Patient/family informed of bed offers received.  Patient chooses bed at       Physician recommends and patient chooses bed at      Patient to be transferred to   on  .  Patient to be transferred to facility by       Patient family notified on   of transfer.  Name of family member notified:        PHYSICIAN       Additional Comment:    _______________________________________________ Edson Snowball, LCSW 09/06/2014, 10:07 AM

## 2014-09-06 NOTE — ED Notes (Signed)
Notified patient and family that wound specialist would be here around 1400 per Edp Wickline
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
## 2014-09-06 NOTE — Progress Notes (Signed)
ED Cm consulted by ED SW about a call to [facility] RN. [facility] WOC out of office. ED unit secretary had paged [facility] RN x 1.   WOC RN voice message directs staff to [facility] RN hospital wide number.   CM obtained pager [phone] for [facility] RN Paged at [phone] Pending return cal

## 2014-09-06 NOTE — Evaluation (Signed)
Physical Therapy Evaluation Patient Details Name: [name] MRN: [id number] DOB: [birth year] Today's Date: 09/06/2014   History of Present Illness  81 yo male brought to ED 09/06/14 with worseneing wounds of both legs, inability to care for self. H/O CHF, DM, CVA, myelodysplasticsyndrome, chronic ulcers of bith legs.  Clinical Impression  Patient presents with malodorous, draining bilateral leg wounds, decreased mobility, was apreviously able to transfer  At [facility] level and now requires increased assistance. Patient will benefit from PT to address problems listed in  Note below  To improve in safety and functional independence .    Follow Up Recommendations SNF;Supervision/Assistance - 24 hour    Equipment Recommendations  None recommended by PT    Recommendations for Other Services       Precautions / Restrictions Precautions Precautions: Fall Precaution Comments: draining leg ulcers.      Mobility  Bed Mobility Overal bed mobility: Needs Assistance;+2 for physical assistance;+ 2 for safety/equipment Bed Mobility: Supine to Sit;Sit to Supine     Supine to sit: Max assist;+2 for physical assistance Sit to supine: Max assist;+2 for physical assistance   General bed mobility comments: assist with legs off of  and onto the bed. assist with trunk to upright.  Transfers                 General transfer comment: NT, does not have WC in room. and his legs are oozing.  Ambulation/Gait                Stairs            Wheelchair Mobility    Modified Rankin (Stroke Patients Only)       Balance Overall balance assessment: Needs assistance Sitting-balance support: Feet unsupported Sitting balance-Leahy Scale: Fair                                       Pertinent Vitals/Pain Pain Assessment: Faces Faces Pain Scale: Hurts little more Pain Location: both legs Pain Descriptors / Indicators: Discomfort    Home Living Family/patient  expects to be discharged to:: Private residence Living Arrangements: Alone Available Help at Discharge: Family;Available PRN/intermittently;Personal care attendant Type of Home: Apartment Home Access: Ramped entrance     Home Layout: One level Home Equipment: Wheelchair - power;Walker - 4 wheels;Bedside commode      Prior Function Level of Independence: Needs assistance      ADL's / Homemaking Assistance Needed: Pt reports having a CNA come in the mornings to assist with ADLs. Has hired assistance for housekeeping.  Pt reports he was mod I with w/c <> toilet transfers and toileting  until recently.         Hand Dominance        Extremity/Trunk Assessment   Upper Extremity Assessment: LUE deficits/detail       LUE Deficits / Details: decreased  fine motor, able to reaCH arm OVERHEAD AND  with decereased control.   Lower Extremity Assessment: RLE deficits/detail;LLE deficits/detail RLE Deficits / Details: unable to flex foot, skin is sloughing off from  medial/arch of the foot. entire lower leg is full of wounds, malodorous LLE Deficits / Details: similar to right but no areas where skin is hanging     Communication      Cognition Arousal/Alertness: Awake/alert Behavior During Therapy: WFL for tasks assessed/performed Overall Cognitive Status: Within Functional Limits for tasks assessed  General Comments      Exercises        Assessment/Plan    PT Assessment Patient needs continued PT services  PT Diagnosis Generalized weakness;Acute pain   PT Problem List Decreased strength;Decreased activity tolerance;Decreased balance;Decreased mobility;Decreased safety awareness;Decreased knowledge of use of DME;Pain;Decreased skin integrity  PT Treatment Interventions DME instruction;Functional mobility training;Therapeutic activities;Patient/family education;Therapeutic exercise   PT Goals (Current goals can be found in the Care Plan section)  Acute Rehab PT Goals Patient Stated Goal: to go home, get out of this bed. PT Goal Formulation: With patient Time For Goal Achievement: 09/20/14 Potential to Achieve Goals: Fair    Frequency Min 2X/week   Barriers to discharge Decreased caregiver support      Co-evaluation               End of Session   Activity Tolerance: Patient limited by pain Patient left: in bed;with call bell/phone within reach;with family/visitor present Nurse Communication: Mobility status    Functional Assessment Tool Used: clinical judgement Functional Limitation: Mobility: Walking and moving around Mobility: Walking and Moving Around Current Status (V3612): 100 percent impaired, limited or restricted Mobility: Walking and Moving Around Goal Status (A4497): At least 20 percent but less than 40 percent impaired, limited or restricted    Time: 1201-1230 PT Time Calculation (min) (ACUTE ONLY): 29 min   Charges:   PT Evaluation $Initial PT Evaluation Tier I: 1 Procedure PT Treatments $Therapeutic Activity: 8-22 mins   PT G Codes:   PT G-Codes **NOT FOR INPATIENT CLASS** Functional Assessment Tool Used: clinical judgement Functional Limitation: Mobility: Walking and moving around Mobility: Walking and Moving Around Current Status (N3005): 100 percent impaired, limited or restricted Mobility: Walking and Moving Around Goal Status (R1021): At least 20 percent but less than 40 percent impaired, limited or restricted    Claretha Cooper 09/06/2014, 1:15 PM Tresa Endo PT [phone]

## 2014-09-06 NOTE — Telephone Encounter (Signed)
Please advise, thanks.
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
## 2014-09-06 NOTE — Progress Notes (Signed)
Pt received bed offer from [facility], by Kirk Ruths [phone], who states that they will submit for [facility] authorization. Unfortunately, this process takes a couple of days. Patient family opted to return to home with home health services through [facility] as well as increasing hours [name] caregiver is available for patient at home. Patient pending consult with wound care and physical therapy. Patient is medically stable for discharge home and will receive wound care and physical therapy  from home health agency. CSW and Rn CM updated pt, pt son in law, and pt daughter.   Belia Heman, [employer] [phone]

## 2014-09-06 NOTE — ED Notes (Signed)
Attempted report called x2

## 2014-09-07 ENCOUNTER — Inpatient Hospital Stay (HOSPITAL_COMMUNITY): Payer: Medicare PPO

## 2014-09-07 DIAGNOSIS — E1121 Type 2 diabetes mellitus with diabetic nephropathy: Secondary | ICD-10-CM

## 2014-09-07 LAB — GLUCOSE, CAPILLARY
GLUCOSE-CAPILLARY: 193 mg/dL — AB (ref 65–99)
GLUCOSE-CAPILLARY: 119 mg/dL — AB (ref 65–99)
GLUCOSE-CAPILLARY: 124 mg/dL — AB (ref 65–99)
Glucose-Capillary: 184 mg/dL — ABNORMAL HIGH (ref 65–99)

## 2014-09-07 LAB — CBC
HCT: 30.6 % — ABNORMAL LOW (ref 39.0–52.0)
Hemoglobin: 9.4 g/dL — ABNORMAL LOW (ref 13.0–17.0)
MCH: 24.9 pg — ABNORMAL LOW (ref 26.0–34.0)
MCHC: 30.7 g/dL (ref 30.0–36.0)
MCV: 81.2 fL (ref 78.0–100.0)
Platelets: 404 10*3/uL — ABNORMAL HIGH (ref 150–400)
RBC: 3.77 MIL/uL — AB (ref 4.22–5.81)
RDW: 19.4 % — AB (ref 11.5–15.5)
WBC: 16.5 10*3/uL — ABNORMAL HIGH (ref 4.0–10.5)

## 2014-09-07 LAB — COMPREHENSIVE METABOLIC PANEL
ALBUMIN: 2.4 g/dL — AB (ref 3.5–5.0)
ALK PHOS: 150 U/L — AB (ref 38–126)
ALT: 23 U/L (ref 17–63)
ANION GAP: 8 (ref 5–15)
AST: 19 U/L (ref 15–41)
BILIRUBIN TOTAL: 0.2 mg/dL — AB (ref 0.3–1.2)
BUN: 82 mg/dL — AB (ref 6–20)
CO2: 20 mmol/L — AB (ref 22–32)
Calcium: 8.7 mg/dL — ABNORMAL LOW (ref 8.9–10.3)
Chloride: 110 mmol/L (ref 101–111)
Creatinine, Ser: 3.34 mg/dL — ABNORMAL HIGH (ref 0.61–1.24)
GFR, EST AFRICAN AMERICAN: 19 mL/min — AB (ref >60)
GFR, EST NON AFRICAN AMERICAN: 16 mL/min — AB (ref >60)
GLUCOSE: 219 mg/dL — AB (ref 65–99)
POTASSIUM: 5.4 mmol/L — AB (ref 3.5–5.1)
SODIUM: 138 mmol/L (ref 135–145)
Total Protein: 7 g/dL (ref 6.5–8.1)

## 2014-09-07 LAB — MRSA PCR SCREENING: MRSA by PCR: NEGATIVE

## 2014-09-07 MED ORDER — ZOLPIDEM TARTRATE 5 MG PO TABS
5.0000 mg | ORAL_TABLET | Freq: Every evening | ORAL | Status: DC | PRN
  Administered 2014-09-07 – 2014-09-19 (×8): 5 mg via ORAL
  Filled 2014-09-07 (×8): qty 1

## 2014-09-07 MED ORDER — HYDROCODONE-ACETAMINOPHEN 5-325 MG PO TABS: 1.0000 | ORAL_TABLET | ORAL | Status: DC | PRN

## 2014-09-07 MED ORDER — SODIUM POLYSTYRENE SULFONATE 15 GM/60ML PO SUSP
15.0000 g | Freq: Once | ORAL | Status: AC
  Administered 2014-09-07: 15 g via ORAL
  Filled 2014-09-07: qty 60

## 2014-09-07 MED ORDER — ACETAMINOPHEN 325 MG PO TABS
650.0000 mg | ORAL_TABLET | Freq: Four times a day (QID) | ORAL | Status: DC | PRN
  Administered 2014-09-07 – 2014-09-09 (×6): 650 mg via ORAL
  Filled 2014-09-07 (×6): qty 2

## 2014-09-07 MED ORDER — PIPERACILLIN-TAZOBACTAM 3.375 G IVPB
3.3750 g | Freq: Three times a day (TID) | INTRAVENOUS | Status: DC
  Administered 2014-09-07 – 2014-09-15 (×22): 3.375 g via INTRAVENOUS
  Filled 2014-09-07 (×27): qty 50

## 2014-09-07 MED ORDER — PIPERACILLIN-TAZOBACTAM 3.375 G IVPB 30 MIN
3.3750 g | Freq: Once | INTRAVENOUS | Status: AC
  Administered 2014-09-07: 3.375 g via INTRAVENOUS
  Filled 2014-09-07: qty 50

## 2014-09-07 MED ORDER — ALPRAZOLAM 0.25 MG PO TABS
0.2500 mg | ORAL_TABLET | Freq: Three times a day (TID) | ORAL | Status: DC | PRN
  Administered 2014-09-07 – 2014-09-23 (×15): 0.25 mg via ORAL
  Filled 2014-09-07 (×15): qty 1

## 2014-09-07 MED ORDER — SILVER SULFADIAZINE 1 % EX CREA
TOPICAL_CREAM | Freq: Every day | CUTANEOUS | Status: DC
  Administered 2014-09-07 – 2014-09-10 (×4): via TOPICAL
  Administered 2014-09-11: 1 via TOPICAL
  Administered 2014-09-12 – 2014-09-23 (×2): via TOPICAL
  Filled 2014-09-07: qty 50
  Filled 2014-09-07 (×2): qty 85
  Filled 2014-09-07: qty 50

## 2014-09-07 MED ORDER — VANCOMYCIN HCL 10 G IV SOLR
1500.0000 mg | INTRAVENOUS | Status: DC
  Administered 2014-09-09 – 2014-09-15 (×4): 1500 mg via INTRAVENOUS
  Filled 2014-09-07 (×4): qty 1500

## 2014-09-07 MED ORDER — VANCOMYCIN HCL 10 G IV SOLR
2250.0000 mg | Freq: Once | INTRAVENOUS | Status: AC
  Administered 2014-09-07: 2250 mg via INTRAVENOUS
  Filled 2014-09-07: qty 2250

## 2014-09-07 MED ORDER — ALBUTEROL SULFATE (2.5 MG/3ML) 0.083% IN NEBU
2.5000 mg | INHALATION_SOLUTION | RESPIRATORY_TRACT | Status: DC | PRN
  Administered 2014-09-07: 2.5 mg via RESPIRATORY_TRACT
  Filled 2014-09-07: qty 3

## 2014-09-07 MED ORDER — DIPHENHYDRAMINE HCL 12.5 MG/5ML PO ELIX: 12.5000 mg | ORAL_SOLUTION | Freq: Every evening | ORAL | Status: DC | PRN

## 2014-09-07 NOTE — Progress Notes (Signed)
Initial Nutrition Assessment  DOCUMENTATION CODES:   Obesity unspecified  INTERVENTION:  - RD will continue to monitor for needs including need for nutrition supplements  NUTRITION DIAGNOSIS:   Increased nutrient needs related to wound healing as evidenced by estimated needs.  GOAL:   Patient will meet greater than or equal to 90% of their needs  MONITOR:   PO intake, Weight trends, Labs, I & O's  REASON FOR ASSESSMENT:   Malnutrition Screening Tool  ASSESSMENT:  81 yr old male whohas a past medical history of CHF; arrhythmia; stroke; hyperlipidemia; HTN; hypertrophy of prostate without urinary obstruction and other lower urinary tract symptoms (LUTS); osteoarthrosis; Type II or unspecified type diabetes mellitus without mention of complication; AV block, Mobitz 1; obstructive sleep apnea; iron deficiency anemia; CVA; renal insufficiency; obesity; allergic rhinitis; CAD; normochromic normocytic anemia; abnormal CXR (04/21/2013); 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 Worsening pain in left and right lower legs has been constantly aching for approximately 1 year and is relieved by rest and worsened by activity.   Pt seen for MST. BMI indicates obesity. Per chart review, pt consumed 50% of breakfast this AM but pt indicates he does not recall what he had to eat. He cannot recall if he had a good appetite or not PTA stating that the past few days he has been "moaning and groaning" all day and that this is unusual for him.  Per weight hx review, pt's weight has trended up recently. Moderate edema to BLE. No muscle or fat wasting noted. Unable to determine if he is meeting needs at this time; will monitor for need for supplements. Medications and labs reviewed; K: 5.4 mmol/L, BUN/creatinine elevated but trending down, Ca: 8.7 mg/dL, GFR:  19.  Diet Order:  Diet Carb Modified Fluid consistency:: Thin; Room service appropriate?: Yes  Skin:  Stage 2 sacral pressure ulcer, wounds to bilateral legs  Last BM:  PTA  Height:   Ht Readings from Last 1 Encounters:  09/06/14 5' 10.5" (1.791 m)    Weight:   Wt Readings from Last 1 Encounters:  09/06/14 244 lb 4.3 oz (110.8 kg)    Ideal Body Weight:  76.82 kg (kg)  Wt Readings from Last 10 Encounters:  09/06/14 244 lb 4.3 oz (110.8 kg)  06/22/14 244 lb (110.678 kg)  06/09/14 248 lb 12.8 oz (112.855 kg)  04/28/14 234 lb (106.142 kg)  04/07/14 235 lb (106.595 kg)  03/31/14 229 lb 12 oz (104.214 kg)  03/25/14 250 lb (113.399 kg)  03/13/14 235 lb 10.6 oz (106.897 kg)  02/09/14 230 lb (104.327 kg)  02/03/14 229 lb 9.6 oz (104.146 kg)    BMI:  Body mass index is 34.54 kg/(m^2).  Estimated Nutritional Needs:   Kcal:  2000-2200  Protein:  85-95 grams  Fluid:  2 L/day  EDUCATION NEEDS:   No education needs identified at this time    Jarome Matin, RD, LDN Inpatient Clinical Dietitian Pager # [phone] After hours/weekend pager # [phone]

## 2014-09-07 NOTE — Progress Notes (Signed)
Una boot placed prior to pt arriving on unit.  Unable to assess wounds.

## 2014-09-07 NOTE — Progress Notes (Signed)
Last night, patient had a few pauses on his telemetry. Longest pause was 1.43 seconds. Will report this to day RN

## 2014-09-07 NOTE — Progress Notes (Signed)
Pt continuing to require 4-6 L of 02.   Pt becoming increasingly anxious and confused throughout the evening.  MD made aware.  Order for ativan, v-q scan and chest x-ray put in.  VSS,  Will continue to monitor closely.   Respiratory called to come assess patient.

## 2014-09-07 NOTE — Progress Notes (Addendum)
[facility] PROGRESS NOTE  [name] NTI:[id number] DOB: [birth year] DOA: 09/06/2014 PCP: [name], MD  HPI/Subjective: Patient is a 81 y.o. male who reports having several years of problems with feet ulcers. These have been treated off and on with wound care clinic visits. He denies having any surgery to the lower extremities. He has type 2 diabetes. He feels overall in his normal state of health and does not feel he has had any fevers. His appetite is stable.  Patient complains of mild pain in feet today. Legs feel warmer than the rest of body. Denies chills. No fever.  Assessment/Plan: Wound, open, foot: - Patient has chronic bilateral lower extremity sores, likely due to diabetes mellitus - Wound care has been consulted and has seen the patient at bedside.  - Ortho consulted and has seen the patient at beside; debridement                  not recommended, follow up Dr [name] Given as outpatient   -White count elevated to 16.5  - Pharmacy consulted for MRSA coverage; Zosyn and  Vancoymycin  per pharmacy  - Norco 5-325 mg for pain  Type 2 diabetes mellitus with renal manifestations - CBGs are still elevated but lower than admission - Continue sliding scale insulin with Novolog, continue detemir inj 120  - torsemide 40 mg for renal coverage  Myelodysplastic syndrome-hx of transfusions - hemoglobin is unchanged at 9.4 - follow cbc in am  Sclerotic lesion on ribs on CXR  - patient has sclerotic lesions on the ribs on CXR, Dr [name] is aware of this finding as per his last note in epic. He will follow the patient as outpatient.   Hypothyroid - on synthroid 125 mcg  History of prostate cancer-15 yrs ago  - stable  Essential hypertension - on hydralazine 100 mg tablet, clonidine patch 0.1 mg  Dyslipidemia - on lipitor  CAD,  moderate - on amlodipin  Code Status: DNR Family Communication: Son-in-law at beside during admission  Disposition Plan: Home with home health services DVT Prophylaxis: Heparin inj 5,000 units   Consultants:  Orthopedics - met with patient at bedside 7/19; signed off  Pharmacy - antibiotic consult  Procedures:  None  Antibiotics:  Zosyn 3.375 g, IV, at 12.5 mL/hr, every 8 hours  Vancoymycin 2250 mg, IV, at 250 mL/hr, one dose    Objective: Filed Vitals:   09/07/14 0518  BP: 133/54  Pulse: 86  Temp: 100 F (37.8 C)  Resp: 18    Intake/Output Summary (Last 24 hours) at 09/07/14 1152 Last data filed at 09/07/14 0925  Gross per 24 hour  Intake    120 ml  Output    850 ml  Net   -730 ml   Filed Weights   09/06/14 1634  Weight: 110.8 kg (244 lb 4.3 oz)    Exam:   General:  Patient is alert and oriented, in no acute distress  Cardiovascular: no pedal edema  Respiratory: No cyanosis, no use of accessory musculature  Abdomen: Soft, non-tender, no organomegaly  Musculoskeletal: pressure dressings covering both legs from feet up to below the knee; skin underneath not visualized during exam  Data Reviewed: Basic Metabolic Panel:  Recent Labs Lab 09/06/14 0904 09/07/14 0437  NA 135 138  K 4.9 5.4*  CL 108 110  CO2 18* 20*  GLUCOSE 345* 219*  BUN 87* 82*  CREATININE 3.49* 3.34*  CALCIUM 8.5* 8.7*   Liver Function Tests:  Recent Labs Lab  09/06/14 0904 09/07/14 0437  AST 25 19  ALT 24 23  ALKPHOS 166* 150*  BILITOT 0.3 0.2*  PROT 7.8 7.0  ALBUMIN 2.7* 2.4*   No results for input(s): LIPASE, AMYLASE in the last 168 hours. No results for input(s): AMMONIA in the last 168 hours. CBC:  Recent Labs Lab 09/06/14 0904 09/07/14 0437  WBC 14.4* 16.5*  NEUTROABS 12.6*  --   HGB 9.4* 9.4*  HCT 30.6* 30.6*  MCV 82.0 81.2  PLT 387 404*   Cardiac Enzymes: No results for input(s): CKTOTAL, CKMB, CKMBINDEX, TROPONINI in the last  168 hours. BNP (last 3 results)  Recent Labs  03/10/14 1039 04/28/14 1530  BNP 456.3* 153.7*    ProBNP (last 3 results)  Recent Labs  10/21/13 2012 11/28/13 1215 01/27/14 1709  PROBNP 1541.0* 1597.0* 2020.0*    CBG:  Recent Labs Lab 09/06/14 1101 09/06/14 1226 09/06/14 1751 09/06/14 2204 09/07/14 0748  GLUCAP 322* 302* 243* 265* 193*    Recent Results (from the past 240 hour(s))  MRSA PCR Screening     Status: None   Collection Time: 09/06/14 10:07 PM  Result Value Ref Range Status   MRSA by PCR NEGATIVE NEGATIVE Final    Comment:        The GeneXpert MRSA Assay (FDA approved for NASAL specimens only), is one component of a comprehensive MRSA colonization surveillance program. It is not intended to diagnose MRSA infection nor to guide or monitor treatment for MRSA infections.      Studies: Dg Chest Portable 1 View  09/06/2014   CLINICAL DATA:  Lower extremity weakness  EXAM: PORTABLE CHEST - 1 VIEW  COMPARISON:  March 10, 2014  FINDINGS: There is scarring in both mid lung regions. There is no frank edema or consolidation. Heart is mildly enlarged with pulmonary vascular within normal limits. There is atherosclerotic change in aorta. The bony structures appear stable compared to prior study without focal lesion. Several bony structures appear rather sclerotic a particularly in the medial proximal right humerus, incompletely visualized on this study.  IMPRESSION: Areas of lung scarring. No frank edema or consolidation. Several bones appear somewhat sclerotic. This finding may warrant prostate evaluation given the propensity of prostate carcinoma to present with sclerotic bony metastases.   Electronically Signed   By: Lowella Grip III M.D.   On: 09/06/2014 09:20   Dg Foot Complete Right  09/06/2014   CLINICAL DATA:  Right foot pain, wound.  EXAM: RIGHT FOOT COMPLETE - 3+ VIEW  COMPARISON:  None.  FINDINGS: Soft tissue swelling within the right foot. No acute  bony abnormality. No acute fracture, subluxation or dislocation. Possible old healed fractures in the proximal second and third metatarsals. No soft tissue gas.  IMPRESSION: No acute bony abnormality.   Electronically Signed   By: Rolm Baptise M.D.   On: 09/06/2014 14:55    Scheduled Meds: . allopurinol  100 mg Oral QHS  . amiodarone  100 mg Oral Daily  . amLODipine  10 mg Oral Daily  . aspirin EC  81 mg Oral Daily  . atorvastatin  40 mg Oral q1800  . citalopram  20 mg Oral Daily  . cloNIDine  0.1 mg Transdermal Weekly  . heparin  5,000 Units Subcutaneous 3 times per day  . hydrALAZINE  100 mg Oral TID  . insulin aspart  0-9 Units Subcutaneous TID WC  . insulin detemir  120 Units Subcutaneous Daily  . isosorbide mononitrate  60 mg Oral Daily  .  levothyroxine  125 mcg Oral QAC breakfast  . pantoprazole  40 mg Oral Daily  . piperacillin-tazobactam (ZOSYN)  IV  3.375 g Intravenous Q8H  . silver sulfADIAZINE   Topical Daily  . sodium chloride  3 mL Intravenous Q12H  . torsemide  40 mg Oral BID  . [START ON 09/09/2014] vancomycin  1,500 mg Intravenous Q48H   Continuous Infusions: . sodium chloride      Active Problems:   Type 2 diabetes mellitus with renal manifestations   Paroxysmal atrial fibrillation- in AF 03/10/14   PROSTATE CANCER, HX OF   Normochromic normocytic anemia   Chronic renal failure   Wound, open, foot   Diabetic foot ulcer    Time spent: 25 minutes    Brandi L. Guerry Bruin, PA-S  [facility] Pager [phone]. If 7PM-7AM, please contact night-coverage at [email or web address], password [facility] 09/07/2014, 11:52 AM  LOS: 1 day     I have taken an interval history, reviewed the chart and examined the patient. I agree with , impression and recommendations. I have made any necessary editorial changes. 81 yr old male with diabetes mellitus, admitted with foot wound, seen by wound care and orthopedics. No plan for debridement per ortho, Dr [name] Given will follow the patient as  outpatient. Started on vancomycin and zosyn for foot wound and leukocytosis. If improved can be discharged in next 24-48 hrs on  Bactrim.

## 2014-09-07 NOTE — Progress Notes (Signed)
ANTIBIOTIC CONSULT NOTE - INITIAL  Pharmacy Consult for Vancomycin/Zosyn Indication: foot wound  Allergies  Allergen Reactions  . Ace Inhibitors Other (See Comments)    HYPOTENSION  . Beta Adrenergic Blockers Other (See Comments)     use cautiously secondary to 2nd degree heart block, hypotension    Patient Measurements: Height: 5' 10.5" (179.1 cm) Weight: 244 lb 4.3 oz (110.8 kg) IBW/kg (Calculated) : 74.15 Adjusted Body Weight: 89 kg  Vital Signs: Temp: 100 F (37.8 C) (07/19 0518) Temp Source: Oral (07/19 0518) BP: 133/54 mmHg (07/19 0518) Pulse Rate: 86 (07/19 0518) Intake/Output from previous day: 07/18 0701 - 07/19 0700 In: -  Out: 850 [Urine:850] Intake/Output from this shift:    Labs:  Recent Labs  09/06/14 0904 09/07/14 0437  WBC 14.4* 16.5*  HGB 9.4* 9.4*  PLT 387 404*  CREATININE 3.49* 3.34*   Estimated Creatinine Clearance: 22.2 mL/min (by C-G formula based on Cr of 3.34). No results for input(s): VANCOTROUGH, VANCOPEAK, VANCORANDOM, GENTTROUGH, GENTPEAK, GENTRANDOM, TOBRATROUGH, TOBRAPEAK, TOBRARND, AMIKACINPEAK, AMIKACINTROU, AMIKACIN in the last 72 hours.   Microbiology: Recent Results (from the past 720 hour(s))  MRSA PCR Screening     Status: None   Collection Time: 09/06/14 10:07 PM  Result Value Ref Range Status   MRSA by PCR NEGATIVE NEGATIVE Final    Comment:        The GeneXpert MRSA Assay (FDA approved for NASAL specimens only), is one component of a comprehensive MRSA colonization surveillance program. It is not intended to diagnose MRSA infection nor to guide or monitor treatment for MRSA infections.     Medical History: Past Medical History  Diagnosis Date  . Chronic diastolic heart failure     secondary to diastolic dysfunction EF (previous EF 35-45%)ef 60%4/09  . Arrhythmia     atrial fibrillation /pt on amiodarone,thought to be poor coumadin  . Stroke   . Other and unspecified hyperlipidemia   . Hypertension   .  Hypertrophy of prostate without urinary obstruction and other lower urinary tract symptoms (LUTS)   . Osteoarthrosis, unspecified whether generalized or localized, unspecified site   . Type II or unspecified type diabetes mellitus without mention of complication, not stated as uncontrolled   . AV block, Mobitz 1     Intolerance to ACE's / discontiuation of beta blockers  . Obstructive sleep apnea   . Iron deficiency anemia, unspecified     s/p EGD and coloscopy 3/10.gastritis.hemorrhids  . Cerebrovascular accident   . Renal insufficiency   . Obesity   . Allergic rhinitis, cause unspecified 05/29/2010  . CAD (coronary artery disease)     cath 12/04: mLAD 50-60%, mCFX 20-30%, pRCA 50-60%, mRCA 40%  . Normochromic normocytic anemia 04/21/2013  . Abnormal CXR 04/21/2013  . Hypertensive kidney disease with CKD stage III 04/21/2013  . Prostate cancer   . CHF (congestive heart failure)   . COPD (chronic obstructive pulmonary disease)   . Shortness of breath dyspnea   . Pneumonia     Medications:  Anti-infectives    Start     Dose/Rate Route Frequency Ordered Stop   09/09/14 1000  vancomycin (VANCOCIN) 1,500 mg in sodium chloride 0.9 % 500 mL IVPB     1,500 mg 250 mL/hr over 120 Minutes Intravenous Every 48 hours 09/07/14 0849     09/07/14 1600  piperacillin-tazobactam (ZOSYN) IVPB 3.375 g     3.375 g 12.5 mL/hr over 240 Minutes Intravenous Every 8 hours 09/07/14 0849     09/07/14  0900  vancomycin (VANCOCIN) 2,250 mg in sodium chloride 0.9 % 500 mL IVPB     2,250 mg 250 mL/hr over 120 Minutes Intravenous  Once 09/07/14 0820     09/07/14 0900  piperacillin-tazobactam (ZOSYN) IVPB 3.375 g     3.375 g 100 mL/hr over 30 Minutes Intravenous  Once 09/07/14 0849       Assessment: 81 y.o. male with PMH DM, CKD, CAD admitted 09/06/2014 for bilateral painful LE sores, most likely from chronic DMT2.  Worsening chronic edema and difficulty ambulating d/t pain. Seen by wound care; no osteo on X-ray, but  today with new leukocytosis.  MD would like to cover for DM foot wound with vancomycin and Zosyn.  7/19 >> vancomycin >> 7/19 >> Zosyn >>    Temp: afebrile WBC: elevated; trending up Renal: SCr 3.34 (baseline 2 - 2.5); CrCl 22 CG, 17 N  No cultures MRSA screen neg   Goal of Therapy:  Vancomycin trough level 10-15 mcg/ml  Eradication of infection Appropriate antibiotic dosing for indication and renal function  Plan:  Day 1 antibiotics Vancomycin 2250 mg IV now, then 1500 mg IV q48 hr Measure vancomycin trough levels at steady state as indicated Zosyn 3.375 g IV given once over 30 minutes, then every 8 hrs by 4-hr infusion  Follow clinical course, renal function, culture results as available  Follow for de-escalation of antibiotics and LOT.  Plan is to convert soon to PO abx (i.e. Bactrim) for discharge once more serious infection ruled out.   Reuel Boom, PharmD, BCPS Pager: [phone] 09/07/2014, 9:00 AM

## 2014-09-07 NOTE — Consult Note (Signed)
WOC consult was completed on 7/18 in the ER, refer to previous progress notes for measurements and assessments.  X-ray was negative for osteomyelitis and ortho service had no further recommendations for topical treatment, Dr [name] Given will plan to follow as an outpatient according to the EMR.  Begin Silvadene to assist with chemical debridement of nonviable tissue to right foot and great toe. Orders provided for bedside nurses. Please re-consult if further assistance is needed.  Gae Dry MSN, RN, [place], [place], [place] .

## 2014-09-07 NOTE — Progress Notes (Signed)
Called by RN to evaluate patient's respiratory status. Patient sleeping restlessly with moaning sounds. Patient bilateral breaths sounds  with left lower lobe crackles otherwise clear  He does have a congested cough and  orientated to name , and where he is. He said he had a restless night although he received his scheduled pain meds and got 5mg  ambien. Patient sleeps in a recliner at home and has a diagnosis of OSA but has never had a formal sleep study. After observing patient for 35 min. He does have periods of shallow breathing, coughs and then resumes deep breathing. I have been able to wean patient to 3lpm after repositioning head. Will continue to observe patient and get an order cpap if it seems appropriate. Wii always order PRN albuterol for dx. COPD. Patient's O2 sat now reading 90-94%.

## 2014-09-07 NOTE — Progress Notes (Signed)
This morning pt o2 saturation was 55%, placed pt on 6L and respiratory called.  Currently sating at 92% on 6L.  Pt alert and oriented but lethargic.  MD made aware.  Will continue to monitor closely.

## 2014-09-08 ENCOUNTER — Other Ambulatory Visit: Payer: Medicare PPO

## 2014-09-08 ENCOUNTER — Ambulatory Visit: Payer: Medicare PPO

## 2014-09-08 ENCOUNTER — Inpatient Hospital Stay (HOSPITAL_COMMUNITY): Payer: Medicare PPO

## 2014-09-08 ENCOUNTER — Telehealth: Payer: Self-pay | Admitting: *Deleted

## 2014-09-08 DIAGNOSIS — S91309D Unspecified open wound, unspecified foot, subsequent encounter: Secondary | ICD-10-CM

## 2014-09-08 LAB — GLUCOSE, CAPILLARY
GLUCOSE-CAPILLARY: 145 mg/dL — AB (ref 65–99)
GLUCOSE-CAPILLARY: 206 mg/dL — AB (ref 65–99)
Glucose-Capillary: 192 mg/dL — ABNORMAL HIGH (ref 65–99)
Glucose-Capillary: 135 mg/dL — ABNORMAL HIGH (ref 65–99)

## 2014-09-08 LAB — CBC
HCT: 30.9 % — ABNORMAL LOW (ref 39.0–52.0)
HEMOGLOBIN: 9.7 g/dL — AB (ref 13.0–17.0)
MCH: 25.3 pg — ABNORMAL LOW (ref 26.0–34.0)
MCHC: 31.4 g/dL (ref 30.0–36.0)
MCV: 80.7 fL (ref 78.0–100.0)
Platelets: 457 10*3/uL — ABNORMAL HIGH (ref 150–400)
RBC: 3.83 MIL/uL — ABNORMAL LOW (ref 4.22–5.81)
RDW: 19.4 % — AB (ref 11.5–15.5)
WBC: 19.6 10*3/uL — ABNORMAL HIGH (ref 4.0–10.5)

## 2014-09-08 LAB — COMPREHENSIVE METABOLIC PANEL
ALT: 22 U/L (ref 17–63)
AST: 25 U/L (ref 15–41)
Albumin: 2.4 g/dL — ABNORMAL LOW (ref 3.5–5.0)
Alkaline Phosphatase: 160 U/L — ABNORMAL HIGH (ref 38–126)
Anion gap: 11 (ref 5–15)
BILIRUBIN TOTAL: 0.5 mg/dL (ref 0.3–1.2)
BUN: 73 mg/dL — AB (ref 6–20)
CALCIUM: 8.7 mg/dL — AB (ref 8.9–10.3)
CHLORIDE: 110 mmol/L (ref 101–111)
CO2: 21 mmol/L — AB (ref 22–32)
Creatinine, Ser: 2.96 mg/dL — ABNORMAL HIGH (ref 0.61–1.24)
GFR calc non Af Amer: 19 mL/min — ABNORMAL LOW (ref >60)
GFR, EST AFRICAN AMERICAN: 22 mL/min — AB (ref >60)
Glucose, Bld: 144 mg/dL — ABNORMAL HIGH (ref 65–99)
Potassium: 4.6 mmol/L (ref 3.5–5.1)
SODIUM: 142 mmol/L (ref 135–145)
Total Protein: 7.1 g/dL (ref 6.5–8.1)

## 2014-09-08 MED ORDER — TECHNETIUM TC 99M DIETHYLENETRIAME-PENTAACETIC ACID: 39.2000 | Freq: Once | INTRAVENOUS | Status: AC | PRN

## 2014-09-08 MED ORDER — TECHNETIUM TO 99M ALBUMIN AGGREGATED
6.4500 | Freq: Once | INTRAVENOUS | Status: AC | PRN
  Administered 2014-09-08: 6 via INTRAVENOUS

## 2014-09-08 MED ORDER — FUROSEMIDE 10 MG/ML IJ SOLN
20.0000 mg | Freq: Once | INTRAMUSCULAR | Status: AC
  Administered 2014-09-08: 20 mg via INTRAVENOUS
  Filled 2014-09-08: qty 2

## 2014-09-08 NOTE — Care Management Important Message (Signed)
Important Message  Patient Details Letter given to Lauren/ RN to present to patientImportant Message  Patient Details  Name: [name] MRN: [id number] Date of Birth: 1932-08-30   Medicare Important Message Given:  Yes-second notification given    Camillo Flaming 09/08/2014, 12:24 PM Name: [name] MRN: [id number] Date of Birth: [birth year]   Medicare Important Message Given:  Yes-second notification given    Camillo Flaming 09/08/2014, 12:24 PM

## 2014-09-08 NOTE — Progress Notes (Addendum)
[facility] PROGRESS NOTE  [name] JOI:[id number] DOB: 05/22/1932 DOA: 09/06/2014 PCP: [name], MD  HPI/Subjective: Patient is a 81 y.o. male who reports having several years of problems with feet ulcers. These have been treated off and on with wound care clinic visits. He denies having any surgery to the lower extremities.     Assessment/Plan: Wound, open, foot: - Patient has chronic bilateral lower extremity sores, likely due to diabetes mellitus - Wound care has been consulted and has seen the patient at bedside.  - Ortho consulted and has seen the patient at beside; debridement                  not recommended, follow up Dr [name] Given as outpatient.  - Pharmacy consulted for MRSA coverage; Zosyn and  Vancoymycin  per pharmacy  - Norco 5-325 mg for pain  Type 2 diabetes mellitus with renal manifestations - CBGs are still elevated but lower than admission - Continue sliding scale insulin with Novolog, continue detemir inj 120  - torsemide 40 mg for renal coverage.            -  CBG (last 3)   Recent Labs  09/08/14 0732 09/08/14 1153 09/08/14 1618  GLUCAP 145* 206* 192*      Myelodysplastic syndrome-hx of transfusions - hemoglobin is unchanged at 9.4   Sclerotic lesion on ribs on CXR  - patient has sclerotic lesions on the ribs on CXR, Dr [name] is aware of this finding as per his last note in epic. He will follow the patient as outpatient.   Hypothyroid - on synthroid 125 mcg  History of prostate cancer-15 yrs ago  - stable  Essential hypertension - on hydralazine 100 mg tablet, clonidine patch 0.1 mg  Dyslipidemia - on lipitor  CAD, moderate - pt denies any chest pain.   Fever, CXR and V/Q scan showed multiple bilateral airspace disease:currently on vancomycin and zosyn.  One dose of lasix  ordered for pulmonary edema.   Code Status: DNR Family Communication: none at bedside.   Disposition Plan: Home with home health services DVT Prophylaxis: Heparin inj 5,000 units   Consultants:  Orthopedics - met with patient at bedside 7/19; signed off  Pharmacy - antibiotic consult  Procedures:  None  Antibiotics:  Zosyn 3.375 g, IV, at 12.5 mL/hr, every 8 hours  Vancoymycin     Objective: Filed Vitals:   09/08/14 1425  BP: 150/59  Pulse: 86  Temp: 99.1 F (37.3 C)  Resp: 20    Intake/Output Summary (Last 24 hours) at 09/08/14 1848 Last data filed at 09/08/14 1428  Gross per 24 hour  Intake    630 ml  Output   1300 ml  Net   -670 ml   Filed Weights   09/06/14 1634 09/08/14 0358  Weight: 110.8 kg (244 lb 4.3 oz) 107.3 kg (236 lb 8.9 oz)    Exam:   General:  Patient is alert and oriented, in no acute distress  Cardiovascular: no pedal edema  Respiratory: No cyanosis, no use of accessory musculature  Abdomen: Soft, non-tender, no organomegaly  Musculoskeletal: pressure dressings covering both legs from feet up to below the knee; skin underneath not visualized during exam  Data Reviewed: Basic Metabolic Panel:  Recent Labs Lab 09/06/14 0904 09/07/14 0437 09/08/14 0511  NA 135 138 142  K 4.9 5.4* 4.6  CL 108 110 110  CO2 18* 20* 21*  GLUCOSE 345* 219* 144*  BUN 87* 82* 73*  CREATININE 3.49* 3.34* 2.96*  CALCIUM 8.5* 8.7* 8.7*   Liver Function Tests:  Recent Labs Lab 09/06/14 0904 09/07/14 0437 09/08/14 0511  AST _0 ALT _1 ALKPHOS 166* 150* 160*  BILITOT 0.3 0.2* 0.5  PROT 7.8 7.0 7.1  ALBUMIN 2.7* 2.4* 2.4*   No results for input(s): LIPASE, AMYLASE in the last 168 hours. No results for input(s): AMMONIA in the last 168 hours. CBC:  Recent Labs Lab 09/06/14 0904 09/07/14 0437 09/08/14 0511  WBC 14.4* 16.5* 19.6*  NEUTROABS 12.6*  --   --   HGB 9.4* 9.4* 9.7*  HCT 30.6* 30.6* 30.9*  MCV 82.0 81.2 80.7   PLT 387 404* 457*   Cardiac Enzymes: No results for input(s): CKTOTAL, CKMB, CKMBINDEX, TROPONINI in the last 168 hours. BNP (last 3 results)  Recent Labs  03/10/14 1039 04/28/14 1530  BNP 456.3* 153.7*    ProBNP (last 3 results)  Recent Labs  10/21/13 2012 11/28/13 1215 01/27/14 1709  PROBNP 1541.0* 1597.0* 2020.0*    CBG:  Recent Labs Lab 09/07/14 1736 09/07/14 2132 09/08/14 0732 09/08/14 1153 09/08/14 1618  GLUCAP 119* 124* 145* 206* 192*    Recent Results (from the past 240 hour(s))  MRSA PCR Screening     Status: None   Collection Time: 09/06/14 10:07 PM  Result Value Ref Range Status   MRSA by PCR NEGATIVE NEGATIVE Final    Comment:        The GeneXpert MRSA Assay (FDA approved for NASAL specimens only), is one component of a comprehensive MRSA colonization surveillance program. It is not intended to diagnose MRSA infection nor to guide or monitor treatment for MRSA infections.      Studies: Dg Chest 1 View  09/07/2014   CLINICAL DATA:  Fluid overload, history CHF, COPD, hypertension, diabetes mellitus, prostate cancer  EXAM: CHEST  1 VIEW  COMPARISON:  Portable exam 1902 hours compared to 09/06/2014  FINDINGS: Enlargement of cardiac silhouette.  Atherosclerotic calcification aorta.  Mediastinal contour stable.  Diffuse pulmonary infiltrates question edema versus less likely infection, increased versus previous study.  No pleural effusion or pneumothorax.  Sclerotic foci at the proximal LEFT humerus question related to metastatic disease  IMPRESSION: Enlargement of cardiac silhouette with diffuse BILATERAL infiltrates favoring pulmonary edema over infection.  Sclerotic foci at the proximal LEFT humerus cannot exclude sclerotic osseous metastases in this patient with a history of prostate cancer.   Electronically Signed   By: Lavonia Dana M.D.   On: 09/07/2014 20:02   Nm Pulmonary Perf And Vent  09/08/2014   CLINICAL DATA:  Shortness of breath.  EXAM:  NUCLEAR MEDICINE VENTILATION - PERFUSION LUNG SCAN  TECHNIQUE: Ventilation images were obtained in multiple projections using inhaled aerosol Tc-75mDTPA. Perfusion images were obtained in multiple projections after intravenous injection of Tc-935mAA.  RADIOPHARMACEUTICALS:  39.2 Technetium-9974mPA aerosol inhalation and 6.45 Technetium-24m66m IV  COMPARISON:  Chest x-ray 09/07/2014.  FINDINGS: Ventilation: Severe bilateral ventilatory defects. No segmental sized perfusion defects noted.  Perfusion: No wedge shaped peripheral perfusion defects to suggest acute pulmonary embolism.  IMPRESSION: Severe bilateral ventilatory defects consistent with bilateral airspace disease noted on recent chest x-ray. No perfusion defects noted to suggest pulmonary embolus. Low probability scan for pulmonary embolus.   Electronically Signed   By: ThomBowlegsn: 09/08/2014 12:12    Scheduled Meds: . allopurinol  100 mg Oral QHS  . amiodarone  100 mg Oral Daily  .  amLODipine  10 mg Oral Daily  . aspirin EC  81 mg Oral Daily  . atorvastatin  40 mg Oral q1800  . citalopram  20 mg Oral Daily  . heparin  5,000 Units Subcutaneous 3 times per day  . hydrALAZINE  100 mg Oral TID  . insulin aspart  0-9 Units Subcutaneous TID WC  . insulin detemir  120 Units Subcutaneous Daily  . isosorbide mononitrate  60 mg Oral Daily  . levothyroxine  125 mcg Oral QAC breakfast  . pantoprazole  40 mg Oral Daily  . piperacillin-tazobactam (ZOSYN)  IV  3.375 g Intravenous Q8H  . silver sulfADIAZINE   Topical Daily  . sodium chloride  3 mL Intravenous Q12H  . torsemide  40 mg Oral BID  . [START ON 09/09/2014] vancomycin  1,500 mg Intravenous Q48H   Continuous Infusions: . sodium chloride      Active Problems:   Type 2 diabetes mellitus with renal manifestations   Paroxysmal atrial fibrillation- in AF 03/10/14   PROSTATE CANCER, HX OF   Normochromic normocytic anemia   Chronic renal failure   Wound, open, foot   Diabetic  foot ulcer    Time spent: 25 minutes    Hosie Poisson, MD, PA-S  [facility] Pager [phone] If 7PM-7AM, please contact night-coverage at [email or web address], password [facility] 09/08/2014, 6:48 PM  LOS: 2 days

## 2014-09-08 NOTE — Clinical Documentation Improvement (Signed)
MD's, NP's and PA's   Per lab values patients Creatinine levels (2.96, 3.34, 3.49) and GFR (22,19,18). Please provide stage of CKD.  Thank you   Possible Clinical Conditions?   CKD Stage III - GFR 30-59    CKD Stage IV - GFR 15-29  CKD Stage V - GFR < 15  Acute on Chronic Renal Failure w CKD stage ?  Other condition  Cannot Clinically determine    Risk Factors:  Diabetes w renal manifestations, HTN, atherosclerosis  Diagnostics: BMET  Treatment: Monitoring  Thank You, Ree Kida ,RN Clinical Documentation Specialist:  [place] Information Management

## 2014-09-08 NOTE — Telephone Encounter (Signed)
Patient's daughter/POA Carola Frost called pro-actively to ensure "Dad receives the aranesp injection today to make sure he doesn't get off schedule."  Currently admitted.  Instructed to consult with te admitting physicians.

## 2014-09-09 ENCOUNTER — Inpatient Hospital Stay (HOSPITAL_COMMUNITY): Payer: Medicare PPO

## 2014-09-09 DIAGNOSIS — N184 Chronic kidney disease, stage 4 (severe): Secondary | ICD-10-CM

## 2014-09-09 LAB — GLUCOSE, CAPILLARY
GLUCOSE-CAPILLARY: 37 mg/dL — AB (ref 65–99)
GLUCOSE-CAPILLARY: 67 mg/dL (ref 65–99)
GLUCOSE-CAPILLARY: 104 mg/dL — AB (ref 65–99)
GLUCOSE-CAPILLARY: 58 mg/dL — AB (ref 65–99)
Glucose-Capillary: 110 mg/dL — ABNORMAL HIGH (ref 65–99)
Glucose-Capillary: 65 mg/dL (ref 65–99)
Glucose-Capillary: 171 mg/dL — ABNORMAL HIGH (ref 65–99)
Glucose-Capillary: 141 mg/dL — ABNORMAL HIGH (ref 65–99)
Glucose-Capillary: 85 mg/dL (ref 65–99)

## 2014-09-09 LAB — BASIC METABOLIC PANEL
Anion gap: 11 (ref 5–15)
BUN: 65 mg/dL — ABNORMAL HIGH (ref 6–20)
CO2: 23 mmol/L (ref 22–32)
Calcium: 8.8 mg/dL — ABNORMAL LOW (ref 8.9–10.3)
Chloride: 109 mmol/L (ref 101–111)
Creatinine, Ser: 2.74 mg/dL — ABNORMAL HIGH (ref 0.61–1.24)
GFR calc Af Amer: 24 mL/min — ABNORMAL LOW (ref >60)
GFR, EST NON AFRICAN AMERICAN: 20 mL/min — AB (ref >60)
Glucose, Bld: 43 mg/dL — CL (ref 65–99)
POTASSIUM: 3.5 mmol/L (ref 3.5–5.1)
Sodium: 143 mmol/L (ref 135–145)

## 2014-09-09 LAB — CBC
HCT: 30.1 % — ABNORMAL LOW (ref 39.0–52.0)
Hemoglobin: 9.5 g/dL — ABNORMAL LOW (ref 13.0–17.0)
MCH: 25 pg — AB (ref 26.0–34.0)
MCHC: 31.6 g/dL (ref 30.0–36.0)
MCV: 79.2 fL (ref 78.0–100.0)
Platelets: 446 10*3/uL — ABNORMAL HIGH (ref 150–400)
RBC: 3.8 MIL/uL — ABNORMAL LOW (ref 4.22–5.81)
RDW: 19.3 % — ABNORMAL HIGH (ref 11.5–15.5)
WBC: 17.5 10*3/uL — AB (ref 4.0–10.5)

## 2014-09-09 MED ORDER — FUROSEMIDE 10 MG/ML IJ SOLN
20.0000 mg | Freq: Once | INTRAMUSCULAR | Status: AC
  Administered 2014-09-09: 20 mg via INTRAVENOUS
  Filled 2014-09-09: qty 2

## 2014-09-09 MED ORDER — INSULIN DETEMIR 100 UNIT/ML ~~LOC~~ SOLN
60.0000 [IU] | Freq: Every day | SUBCUTANEOUS | Status: DC
Start: 2014-09-09 — End: 2014-09-10
  Administered 2014-09-09 – 2014-09-10 (×2): 60 [IU] via SUBCUTANEOUS
  Filled 2014-09-09 (×2): qty 0.6

## 2014-09-09 MED ORDER — DEXTROSE 50 % IV SOLN
INTRAVENOUS | Status: AC
  Administered 2014-09-09: 50 mL
  Filled 2014-09-09: qty 50

## 2014-09-09 NOTE — Progress Notes (Signed)
Patient blood sugar 63. MD notified and says to hold 120 units of levemir.

## 2014-09-09 NOTE — Progress Notes (Signed)
Hypoglycemic Event  CBG: 37  Treatment: D50 IV 50 mL  Symptoms: Sweaty  Follow-up CBG: Time:0555 CBG Result:110  Possible Reasons for Event: Unknown  Comments/MD notified:N/A    [name] Costella Hatcher M  Remember to initiate Hypoglycemia Order Set & complete

## 2014-09-09 NOTE — Progress Notes (Signed)
Alerted by the Nurse tech. Patient BP 174/ 75,RN rechecked BP manually showed 170/65. N.P. Informed. Waiting for orders. Will continue to monitor the pt

## 2014-09-09 NOTE — Progress Notes (Signed)
Assisted [name] NT with bath. Pt has extensive breakdown of skin that is peeling and excoriated on his bottom. Moisture barrier was placed thickly covering the expanse of the bottom with allevyn placed on small open areas. Antifungal powder was sprinkled on the scrotum and kept away from broken skin. Junior, RN was made aware of skin as this RN was just helping the tech.

## 2014-09-09 NOTE — Progress Notes (Signed)
[facility] PROGRESS NOTE  [name] YJE:[id number] DOB: [birth year] DOA: 09/06/2014 PCP: [name], MD  HPI/Subjective: Patient is a 81 y.o. male who reports having several years of problems with feet ulcers. These have been treated off and on with wound care clinic visits. He was admitted for open diabetic foot ulcers. Ortho was consulted and there is no plan for debridement at this time. He developed some sob yesterday,    Assessment/Plan: Wound, open, foot: - Patient has chronic bilateral lower extremity sores, likely due to diabetes mellitus - Wound care has been consulted and has seen the patient at bedside.  - Ortho consulted and has seen the patient at beside; debridement                  not recommended, follow up Dr [name] Given as outpatient.  - Pharmacy consulted for MRSA coverage; Zosyn and  Vancoymycin  per pharmacy  - Norco 5-325 mg for pain and physical therapy.   Type 2 diabetes mellitus with renal manifestations - CBGs are still elevated but lower than admission - Continue sliding scale insulin with Novolog, cut the dose of levemir to 60 units after he had a hypoglycemic event overnight.               -  CBG (last 3)   Recent Labs  09/09/14 0919 09/09/14 1008 09/09/14 1158  GLUCAP 67 104* 171*      Myelodysplastic syndrome-hx of transfusions - hemoglobin is unchanged at 9.5   Sclerotic lesion on ribs on CXR  - patient has sclerotic lesions on the ribs on CXR, Dr [name] is aware of this finding as per his last note in epic. He will follow the patient as outpatient.   Hypothyroid - on synthroid 125 mcg  History of prostate cancer-15 yrs ago  - stable  Essential hypertension - on hydralazine 100 mg tablet, clonidine patch 0.1 mg Better controlled.   Dyslipidemia - on lipitor  CAD,  moderate - pt denies any chest pain.   Acute on CKD stage 4:  - improving, with diuresis.  - on torsemide.   Fever, CXR and V/Q scan showed multiple bilateral airspace disease:currently on vancomycin and zosyn.  One dose of lasix ordered for pulmonary edema. Repeat CXR shows improving pulmonary edema. Would recommend continue the antibiotics to complete the course of health care associated pneumonia.   Code Status: DNR Family Communication: none at bedside.   Disposition Plan: Home with home health services DVT Prophylaxis: Heparin inj 5,000 units   Consultants:  Orthopedics - met with patient at bedside 7/19; signed off  Pharmacy - antibiotic consult  Procedures:  None  Antibiotics:  Zosyn 3.375 g, IV, at 12.5 mL/hr, every 8 hours  Vancomycin every 48 hours renally dosed.     Objective: Filed Vitals:   09/09/14 1439  BP: 150/60  Pulse: 75  Temp: 98.1 F (36.7 C)  Resp: 20    Intake/Output Summary (Last 24 hours) at 09/09/14 1611 Last data filed at 09/09/14 1456  Gross per 24 hour  Intake    480 ml  Output    600 ml  Net   -120 ml   Filed Weights   09/06/14 1634 09/08/14 0358 09/09/14 0452  Weight: 110.8 kg (244 lb 4.3 oz) 107.3 kg (236 lb 8.9 oz) 108.2 kg (238 lb 8.6 oz)    Exam:   General:  Patient is alert and oriented, in no acute distress  Cardiovascular: no pedal edema  Respiratory: No  cyanosis, no use of accessory musculature  Abdomen: Soft, non-tender, no organomegaly  Musculoskeletal: pressure dressings covering both legs from feet up to below the knee; skin underneath not visualized during exam  Data Reviewed: Basic Metabolic Panel:  Recent Labs Lab 09/06/14 0904 09/07/14 0437 09/08/14 0511 09/09/14 0415  NA 135 138 142 143  K 4.9 5.4* 4.6 3.5  CL 108 110 110 109  CO2 18* 20* 21* 23  GLUCOSE 345* 219* 144* 43*  BUN 87* 82* 73* 65*  CREATININE 3.49* 3.34* 2.96* 2.74*  CALCIUM 8.5* 8.7* 8.7* 8.8*   Liver  Function Tests:  Recent Labs Lab 09/06/14 0904 09/07/14 0437 09/08/14 0511  AST 25 19 25   ALT 24 23 22   ALKPHOS 166* 150* 160*  BILITOT 0.3 0.2* 0.5  PROT 7.8 7.0 7.1  ALBUMIN 2.7* 2.4* 2.4*   No results for input(s): LIPASE, AMYLASE in the last 168 hours. No results for input(s): AMMONIA in the last 168 hours. CBC:  Recent Labs Lab 09/06/14 0904 09/07/14 0437 09/08/14 0511 09/09/14 0415  WBC 14.4* 16.5* 19.6* 17.5*  NEUTROABS 12.6*  --   --   --   HGB 9.4* 9.4* 9.7* 9.5*  HCT 30.6* 30.6* 30.9* 30.1*  MCV 82.0 81.2 80.7 79.2  PLT 387 404* 457* 446*   Cardiac Enzymes: No results for input(s): CKTOTAL, CKMB, CKMBINDEX, TROPONINI in the last 168 hours. BNP (last 3 results)  Recent Labs  03/10/14 1039 04/28/14 1530  BNP 456.3* 153.7*    ProBNP (last 3 results)  Recent Labs  10/21/13 2012 11/28/13 1215 01/27/14 1709  PROBNP 1541.0* 1597.0* 2020.0*    CBG:  Recent Labs Lab 09/09/14 0553 09/09/14 0905 09/09/14 0919 09/09/14 1008 09/09/14 1158  GLUCAP 110* 65 67 104* 171*    Recent Results (from the past 240 hour(s))  MRSA PCR Screening     Status: None   Collection Time: 09/06/14 10:07 PM  Result Value Ref Range Status   MRSA by PCR NEGATIVE NEGATIVE Final    Comment:        The GeneXpert MRSA Assay (FDA approved for NASAL specimens only), is one component of a comprehensive MRSA colonization surveillance program. It is not intended to diagnose MRSA infection nor to guide or monitor treatment for MRSA infections.      Studies: Dg Chest 1 View  09/07/2014   CLINICAL DATA:  Fluid overload, history CHF, COPD, hypertension, diabetes mellitus, prostate cancer  EXAM: CHEST  1 VIEW  COMPARISON:  Portable exam 1902 hours compared to 09/06/2014  FINDINGS: Enlargement of cardiac silhouette.  Atherosclerotic calcification aorta.  Mediastinal contour stable.  Diffuse pulmonary infiltrates question edema versus less likely infection, increased versus  previous study.  No pleural effusion or pneumothorax.  Sclerotic foci at the proximal LEFT humerus question related to metastatic disease  IMPRESSION: Enlargement of cardiac silhouette with diffuse BILATERAL infiltrates favoring pulmonary edema over infection.  Sclerotic foci at the proximal LEFT humerus cannot exclude sclerotic osseous metastases in this patient with a history of prostate cancer.   Electronically Signed   By: Lavonia Dana M.D.   On: 09/07/2014 20:02   Dg Chest 2 View  09/09/2014   CLINICAL DATA:  81 year old male with fever, cough and congestion  EXAM: CHEST  2 VIEW  COMPARISON:  Prior chest x-ray 09/07/2014  FINDINGS: Overall, there has been some improvement in diffuse bilateral interstitial and airspace opacities. However, there remains some focal airspace opacification within the right mid lung. No pleural effusion. No pneumothorax.  Stable cardiomegaly and mediastinal contours. Atherosclerotic calcification remains present within the transverse aorta.  IMPRESSION: 1. Improving pulmonary edema. 2. Residual patchy airspace opacity in the right mid lung in the region of the minor fissure may represent superimposed infiltrate/pneumonia, or perhaps some fluid trapped within the minor fissure. 3. Stable cardiomegaly.   Electronically Signed   By: Jacqulynn Cadet M.D.   On: 09/09/2014 10:21   Nm Pulmonary Perf And Vent  09/08/2014   CLINICAL DATA:  Shortness of breath.  EXAM: NUCLEAR MEDICINE VENTILATION - PERFUSION LUNG SCAN  TECHNIQUE: Ventilation images were obtained in multiple projections using inhaled aerosol Tc-98mDTPA. Perfusion images were obtained in multiple projections after intravenous injection of Tc-921mAA.  RADIOPHARMACEUTICALS:  39.2 Technetium-9958mPA aerosol inhalation and 6.45 Technetium-54m66m IV  COMPARISON:  Chest x-ray 09/07/2014.  FINDINGS: Ventilation: Severe bilateral ventilatory defects. No segmental sized perfusion defects noted.  Perfusion: No wedge shaped  peripheral perfusion defects to suggest acute pulmonary embolism.  IMPRESSION: Severe bilateral ventilatory defects consistent with bilateral airspace disease noted on recent chest x-ray. No perfusion defects noted to suggest pulmonary embolus. Low probability scan for pulmonary embolus.   Electronically Signed   By: ThomLynchburgn: 09/08/2014 12:12    Scheduled Meds: . allopurinol  100 mg Oral QHS  . amiodarone  100 mg Oral Daily  . amLODipine  10 mg Oral Daily  . aspirin EC  81 mg Oral Daily  . atorvastatin  40 mg Oral q1800  . citalopram  20 mg Oral Daily  . furosemide  20 mg Intravenous Once  . heparin  5,000 Units Subcutaneous 3 times per day  . hydrALAZINE  100 mg Oral TID  . insulin aspart  0-9 Units Subcutaneous TID WC  . insulin detemir  60 Units Subcutaneous Daily  . isosorbide mononitrate  60 mg Oral Daily  . levothyroxine  125 mcg Oral QAC breakfast  . pantoprazole  40 mg Oral Daily  . piperacillin-tazobactam (ZOSYN)  IV  3.375 g Intravenous Q8H  . silver sulfADIAZINE   Topical Daily  . sodium chloride  3 mL Intravenous Q12H  . torsemide  40 mg Oral BID  . vancomycin  1,500 mg Intravenous Q48H   Continuous Infusions: . sodium chloride      Active Problems:   Type 2 diabetes mellitus with renal manifestations   Paroxysmal atrial fibrillation- in AF 03/10/14   PROSTATE CANCER, HX OF   Normochromic normocytic anemia   Chronic renal failure   Wound, open, foot   Diabetic foot ulcer    Time spent: 25 minutes    VijaHosie PoissonD  [facility] Pager [phone]-7AM, please contact night-coverage at [email or web address], password [email or web address]1/2016, 4:11 PM  LOS: 3 days

## 2014-09-09 NOTE — Progress Notes (Signed)
Converted to A.fib. Pt has a history of A. Fib and is taking cardizem. MD notified

## 2014-09-10 LAB — GLUCOSE, CAPILLARY
GLUCOSE-CAPILLARY: 96 mg/dL (ref 65–99)
GLUCOSE-CAPILLARY: 142 mg/dL — AB (ref 65–99)
GLUCOSE-CAPILLARY: 134 mg/dL — AB (ref 65–99)
Glucose-Capillary: 108 mg/dL — ABNORMAL HIGH (ref 65–99)
Glucose-Capillary: 74 mg/dL (ref 65–99)

## 2014-09-10 LAB — CBC
HCT: 29.5 % — ABNORMAL LOW (ref 39.0–52.0)
HEMOGLOBIN: 9.1 g/dL — AB (ref 13.0–17.0)
MCH: 24.7 pg — AB (ref 26.0–34.0)
MCHC: 30.8 g/dL (ref 30.0–36.0)
MCV: 79.9 fL (ref 78.0–100.0)
PLATELETS: 497 10*3/uL — AB (ref 150–400)
RBC: 3.69 MIL/uL — ABNORMAL LOW (ref 4.22–5.81)
RDW: 19.5 % — ABNORMAL HIGH (ref 11.5–15.5)
WBC: 15.1 10*3/uL — AB (ref 4.0–10.5)

## 2014-09-10 LAB — BASIC METABOLIC PANEL
Anion gap: 9 (ref 5–15)
BUN: 56 mg/dL — ABNORMAL HIGH (ref 6–20)
CHLORIDE: 110 mmol/L (ref 101–111)
CO2: 26 mmol/L (ref 22–32)
CREATININE: 2.47 mg/dL — AB (ref 0.61–1.24)
Calcium: 8.4 mg/dL — ABNORMAL LOW (ref 8.9–10.3)
GFR, EST AFRICAN AMERICAN: 27 mL/min — AB (ref >60)
GFR, EST NON AFRICAN AMERICAN: 23 mL/min — AB (ref >60)
GLUCOSE: 38 mg/dL — AB (ref 65–99)
Potassium: 3.6 mmol/L (ref 3.5–5.1)
Sodium: 145 mmol/L (ref 135–145)

## 2014-09-10 MED ORDER — DEXTROSE 50 % IV SOLN
INTRAVENOUS | Status: AC
  Administered 2014-09-10: 50 mL
  Filled 2014-09-10: qty 50

## 2014-09-10 MED ORDER — INSULIN DETEMIR 100 UNIT/ML ~~LOC~~ SOLN
30.0000 [IU] | Freq: Every day | SUBCUTANEOUS | Status: DC
  Administered 2014-09-11 – 2014-09-23 (×12): 30 [IU] via SUBCUTANEOUS
  Filled 2014-09-10 (×14): qty 0.3

## 2014-09-10 MED ORDER — OXYCODONE HCL 5 MG PO TABS
5.0000 mg | ORAL_TABLET | Freq: Once | ORAL | Status: AC
  Administered 2014-09-10: 5 mg via ORAL
  Filled 2014-09-10: qty 1

## 2014-09-10 MED ORDER — OXYCODONE HCL 5 MG PO TABS
5.0000 mg | ORAL_TABLET | ORAL | Status: DC | PRN
  Administered 2014-09-10 – 2014-09-23 (×24): 5 mg via ORAL
  Filled 2014-09-10 (×25): qty 1

## 2014-09-10 NOTE — Care Management Important Message (Signed)
Important Message  Patient Details  Name: [name] MRN: [id number] Date of Birth: [birth year]   Medicare Important Message Given:  Yes-third notification given    Shelda Altes 09/10/2014, 2:06 [place] Message  Patient Details  Name: [name] MRN: [id number] Date of Birth: Mar 12, 1932   Medicare Important Message Given:  Yes-third notification given    Shelda Altes 09/10/2014, 2:05 PM

## 2014-09-10 NOTE — Progress Notes (Signed)
Hypoglycemic Event  CBG:58  Treatment: 15 GM carbohydrate snack  Symptoms: None  Follow-up CBG: Time:2237 CBG Result:85  Possible Reasons for Event: Unknown  Comments/MD notified:N/A    Cobain Morici Costella Hatcher M  Remember to initiate Hypoglycemia Order Set & complete

## 2014-09-10 NOTE — Progress Notes (Signed)
Physical Therapy Treatment Patient Details Name: TAQUAN BRALLEY MRN: [id number] DOB: 1933/01/03 Today's Date: 09/10/2014    History of Present Illness 81 yo male brought to ED 09/06/14 with worseneing wounds of both legs, inability to care for self. H/O CHF, DM, CVA, myelodysplasticsyndrome, chronic ulcers of bith legs.    PT Comments    Pt OOB in recliner with several family members in room.  2 daughters.  One stated she was the owner/operator of "[employer]".   Family stated pt was residing at [facility] on [address].  They are hoping to have pt D/C to [facility]. Was only able to perform AAROM B LE's due to pain level and "oozing" B LE's.  Nursing staff was able to get pt OOB to recliner using the steady.   Follow Up Recommendations  SNF (family looking at facilities)     Equipment Recommendations       Recommendations for Other Services       Precautions / Restrictions Precautions Precautions: Fall Precaution Comments: draining leg ulcers.    Mobility  Bed Mobility               General bed mobility comments: Pt OOB in recliner via STEADY per RN  Transfers                 General transfer comment: OOB to recliner recently via STEADY and + 2 assist per nursing staff  Ambulation/Gait             General Gait Details: unable to attempt due to pain level and B LE's "oozing"   Stairs            Wheelchair Mobility    Modified Rankin (Stroke Patients Only)       Balance                                    Cognition Arousal/Alertness: Awake/alert                          Exercises      General Comments        Pertinent Vitals/Pain Pain Assessment: Faces Faces Pain Scale: Hurts even more Pain Location: B LE's and back Pain Descriptors / Indicators: Restless Pain Intervention(s): Monitored during session;Repositioned;Premedicated before session    Home Living                       Prior Function            PT Goals (current goals can now be found in the care plan section) Progress towards PT goals: Progressing toward goals    Frequency  Min 2X/week    PT Plan      Co-evaluation             End of Session   Activity Tolerance: Patient limited by pain Patient left: in chair;with call bell/phone within reach     Time: 1350-1410 PT Time Calculation (min) (ACUTE ONLY): 20 min  Charges:  $Therapeutic Exercise: 8-22 mins                    G Codes:      Rica Koyanagi  PTA [facility]  Acute  Rehab Pager      [phone]

## 2014-09-10 NOTE — Progress Notes (Signed)
Hypoglycemic Event  CBG: 38  Treatment: D50 IV 50 mL  Symptoms: Sweaty  Follow-up CBG: Time:0557 CBG Result:109  Possible Reasons for Event: Unknown  Comments/MD notified:N/A    [name] Costella Hatcher M  Remember to initiate Hypoglycemia Order Set & complete

## 2014-09-10 NOTE — Progress Notes (Signed)
Inpatient Diabetes Program Recommendations  AACE/ADA: New Consensus Statement on Inpatient Glycemic Control (2013)  Target Ranges:  Prepandial:   less than 140 mg/dL      Peak postprandial:   less than 180 mg/dL (1-2 hours)      Critically ill patients:  140 - 180 mg/dL   Reason for Visit: Hypoglycemia  Results for RYLON, POITRA (MRN [id number]) as of 09/10/2014 12:26  Ref. Range 09/09/2014 05:16 09/09/2014 05:53 09/09/2014 09:05 09/09/2014 09:19 09/09/2014 10:08 09/09/2014 11:58 09/09/2014 16:18 09/09/2014 20:49 09/09/2014 22:37 09/10/2014 05:57 09/10/2014 07:49 09/10/2014 11:59  Glucose-Capillary Latest Ref Range: 65-99 mg/dL 37 (LL) 110 (H) 65 67 104 (H) 171 (H) 141 (H) 58 (L) 85 108 (H) 74 96   Hypoglycemia in early am and HS last night. Lantus dose has decreased from 120 units to 60 units and beginning tomorrow, 30 units QAM. Continues with poor po intake. Agree with current orders.  If CBGs >180 mg/dL, may need to increase Novolog to moderate tidwc.  Will continue to follow. Thank you. Lorenda Peck, RD, LDN, CDE Inpatient Diabetes Coordinator [phone]

## 2014-09-10 NOTE — Progress Notes (Addendum)
ANTIBIOTIC CONSULT NOTE - FOLLOW UP  Pharmacy Consult for Vancomycin, Zosyn Indication: foot wound, HCAP  Allergies  Allergen Reactions  . Ace Inhibitors Other (See Comments)    HYPOTENSION  . Beta Adrenergic Blockers Other (See Comments)     use cautiously secondary to 2nd degree heart block, hypotension    Patient Measurements: Height: 5' 10.5" (179.1 cm) Weight: 241 lb 13.5 oz (109.7 kg) IBW/kg (Calculated) : 74.15  Vital Signs: Temp: 99.1 F (37.3 C) (07/22 0610) Temp Source: Oral (07/22 0610) BP: 157/65 mmHg (07/22 0620) Pulse Rate: 84 (07/22 0610) Intake/Output from previous day: 07/21 0701 - 07/22 0700 In: 995 [P.O.:745; IV Piggyback:250] Out: 600 [Urine:600] Intake/Output from this shift:    Labs:  Recent Labs  09/08/14 0511 09/09/14 0415 09/10/14 0353  WBC 19.6* 17.5* 15.1*  HGB 9.7* 9.5* 9.1*  PLT 457* 446* 497*  CREATININE 2.96* 2.74* 2.47*   Estimated Creatinine Clearance: 29.8 mL/min (by C-G formula based on Cr of 2.47). No results for input(s): VANCOTROUGH, VANCOPEAK, VANCORANDOM, GENTTROUGH, GENTPEAK, GENTRANDOM, TOBRATROUGH, TOBRAPEAK, TOBRARND, AMIKACINPEAK, AMIKACINTROU, AMIKACIN in the last 72 hours.   Microbiology: Recent Results (from the past 720 hour(s))  MRSA PCR Screening     Status: None   Collection Time: 09/06/14 10:07 PM  Result Value Ref Range Status   MRSA by PCR NEGATIVE NEGATIVE Final    Comment:        The GeneXpert MRSA Assay (FDA approved for NASAL specimens only), is one component of a comprehensive MRSA colonization surveillance program. It is not intended to diagnose MRSA infection nor to guide or monitor treatment for MRSA infections.     Anti-infectives    Start     Dose/Rate Route Frequency Ordered Stop   09/09/14 1000  vancomycin (VANCOCIN) 1,500 mg in sodium chloride 0.9 % 500 mL IVPB     1,500 mg 250 mL/hr over 120 Minutes Intravenous Every 48 hours 09/07/14 0849     09/07/14 1600   piperacillin-tazobactam (ZOSYN) IVPB 3.375 g     3.375 g 12.5 mL/hr over 240 Minutes Intravenous Every 8 hours 09/07/14 0849     09/07/14 0900  vancomycin (VANCOCIN) 2,250 mg in sodium chloride 0.9 % 500 mL IVPB     2,250 mg 250 mL/hr over 120 Minutes Intravenous  Once 09/07/14 0820 09/07/14 1141   09/07/14 0900  piperacillin-tazobactam (ZOSYN) IVPB 3.375 g     3.375 g 100 mL/hr over 30 Minutes Intravenous  Once 09/07/14 0849 09/07/14 1011      Assessment: 81 y.o. male with PMH DM, CKD, CAD admitted 09/06/2014 for bilateral painful LE sores, most likely from chronic DMT2. Worsening chronic edema and difficulty ambulating d/t pain. Seen by wound care; no osteo on X-ray, but today with new leukocytosis. MD covering for DM foot wound with vancomycin and Zosyn.  Now with possible PNA vs pulmonary edema; extending coverage for HCAP.  7/19 >> vancomycin >> 7/19 >> Zosyn >>   Temp: low fevers on 7/19; </= 99 since WBC: elevated but trending down Renal: SCr 2.5 (baseline 2 - 2.5); CrCl 30 CG, 24 N 7/19 CXR shows bilat infiltr (edema > PNA) 7/20 VQ scan w/ severe bilat vent defects 7/21 CXR shows pulm edema with possible superimposed infiltrate/PNA  No cultures MRSA screen neg   Goal of Therapy:  Vancomycin trough level 15-20 mcg/ml  Eradication of infection Appropriate antibiotic dosing for indication and renal function  Plan:  Day 4 antibiotics  Continue Zosyn 3.375 g IV given every 8  hrs by 4-hr infusion  Continue vancomycin 1500 mg IV q48 hr  Will check vancomycin trough tomorrow before 3rd dose  Follow clinical course, renal function, culture results as available  Follow for de-escalation of antibiotics and LOT. Standard duration for HCAP is 8 days, but given improvement in Temps/WBC, would consider narrowing to PO antibiotics to complete therapy   Reuel Boom, PharmD, BCPS Pager: [phone] 09/10/2014, 10:09 AM

## 2014-09-10 NOTE — Progress Notes (Signed)
CRITICAL VALUE ALERT  Critical value received:  38  Date of notification:  09/09/13  Time of notification:  0525  Critical value read back:Yes.    Nurse who received alert:  Levie Heritage RN  MD notified (1st page):  N/A  Time of first page:  N/A  MD notified (2nd page):  Time of second page:  Responding MD:  N/A  Time MD responded:  N/A

## 2014-09-10 NOTE — Progress Notes (Signed)
[facility] PROGRESS NOTE  JIMEL MYLER MBE:[id number] DOB: [birth year] DOA: 09/06/2014 PCP: [name], MD  HPI/Subjective: Patient is a 81 y.o. male who reports having several years of problems with feet ulcers. These have been treated off and on with wound care clinic visits. He was admitted for open diabetic foot ulcers. Ortho was consulted and there is no plan for debridement at this time.    Assessment/Plan: Wound, open, foot: - Patient has chronic bilateral lower extremity sores, likely due to diabetes mellitus - Wound care has been consulted and has seen the patient at bedside.  - Ortho consulted and has seen the patient at beside; debridement                  not recommended, follow up Dr [name] Given as outpatient.  - Pharmacy consulted for MRSA coverage; Zosyn and  Vancoymycin  per pharmacy  - Norco 5-325 mg for pain and physical therapy.   Type 2 diabetes mellitus with renal manifestations - CBGs are still elevated but lower than admission - Continue sliding scale insulin with Novolog, cut the dose of levemir to 30 units after he had a hypoglycemic event overnight.               -  CBG (last 3)   Recent Labs  09/10/14 0749 09/10/14 1159 09/10/14 1639  GLUCAP 74 96 142*      Myelodysplastic syndrome-hx of transfusions - hemoglobin is unchanged at 9.5   Sclerotic lesion on ribs on CXR  - patient has sclerotic lesions on the ribs on CXR, Dr [name] is aware of this finding as per his last note in epic. He will follow the patient as outpatient.   Hypothyroid - on synthroid 125 mcg  History of prostate cancer-15 yrs ago  - stable  Essential hypertension - on hydralazine 100 mg tablet, clonidine patch 0.1 mg Better controlled.   Dyslipidemia - on lipitor  CAD, moderate - pt denies any chest pain.    Acute on CKD stage 4:  - improving, with diuresis.  - on torsemide.   Fever, CXR and V/Q scan showed multiple bilateral airspace disease:currently on vancomycin and zosyn.  One dose of lasix ordered for pulmonary edema. Repeat CXR shows improving pulmonary edema. Would recommend continue the antibiotics to complete the course of health care associated pneumonia.   Code Status: DNR Family Communication: none at bedside.   Disposition Plan: Home with home health services DVT Prophylaxis: Heparin inj 5,000 units   Consultants:  Orthopedics - met with patient at bedside 7/19; signed off  Pharmacy - antibiotic consult  Procedures:  None  Antibiotics:  Zosyn 3.375 g, IV, at 12.5 mL/hr, every 8 hours  Vancomycin every 48 hours renally dosed.     Objective: Filed Vitals:   09/10/14 1344  BP: 139/68  Pulse: 95  Temp: 98.1 F (36.7 C)  Resp:     Intake/Output Summary (Last 24 hours) at 09/10/14 1846 Last data filed at 09/10/14 1700  Gross per 24 hour  Intake   1210 ml  Output    125 ml  Net   1085 ml   Filed Weights   09/08/14 0358 09/09/14 0452 09/10/14 0610  Weight: 107.3 kg (236 lb 8.9 oz) 108.2 kg (238 lb 8.6 oz) 109.7 kg (241 lb 13.5 oz)    Exam:   General:  Patient is alert and oriented, in no acute distress  Cardiovascular: no pedal edema  Respiratory: No cyanosis, no use of accessory musculature  Abdomen: Soft, non-tender, no organomegaly  Musculoskeletal: pressure dressings covering both legs from feet up to below the knee; skin underneath not visualized during exam  Data Reviewed: Basic Metabolic Panel:  Recent Labs Lab 09/06/14 0904 09/07/14 0437 09/08/14 0511 09/09/14 0415 09/10/14 0353  NA 135 138 142 143 145  K 4.9 5.4* 4.6 3.5 3.6  CL 108 110 110 109 110  CO2 18* 20* 21* 23 26  GLUCOSE 345* 219* 144* 43* 38*  BUN 87* 82* 73* 65* 56*  CREATININE 3.49* 3.34* 2.96* 2.74* 2.47*  CALCIUM 8.5* 8.7* 8.7* 8.8* 8.4*   Liver Function  Tests:  Recent Labs Lab 09/06/14 0904 09/07/14 0437 09/08/14 0511  AST _0 ALT _1 ALKPHOS 166* 150* 160*  BILITOT 0.3 0.2* 0.5  PROT 7.8 7.0 7.1  ALBUMIN 2.7* 2.4* 2.4*   No results for input(s): LIPASE, AMYLASE in the last 168 hours. No results for input(s): AMMONIA in the last 168 hours. CBC:  Recent Labs Lab 09/06/14 0904 09/07/14 0437 09/08/14 0511 09/09/14 0415 09/10/14 0353  WBC 14.4* 16.5* 19.6* 17.5* 15.1*  NEUTROABS 12.6*  --   --   --   --   HGB 9.4* 9.4* 9.7* 9.5* 9.1*  HCT 30.6* 30.6* 30.9* 30.1* 29.5*  MCV 82.0 81.2 80.7 79.2 79.9  PLT 387 404* 457* 446* 497*   Cardiac Enzymes: No results for input(s): CKTOTAL, CKMB, CKMBINDEX, TROPONINI in the last 168 hours. BNP (last 3 results)  Recent Labs  03/10/14 1039 04/28/14 1530  BNP 456.3* 153.7*    ProBNP (last 3 results)  Recent Labs  10/21/13 2012 11/28/13 1215 01/27/14 1709  PROBNP 1541.0* 1597.0* 2020.0*    CBG:  Recent Labs Lab 09/09/14 2237 09/10/14 0557 09/10/14 0749 09/10/14 1159 09/10/14 1639  GLUCAP 85 108* 74 96 142*    Recent Results (from the past 240 hour(s))  MRSA PCR Screening     Status: None   Collection Time: 09/06/14 10:07 PM  Result Value Ref Range Status   MRSA by PCR NEGATIVE NEGATIVE Final    Comment:        The GeneXpert MRSA Assay (FDA approved for NASAL specimens only), is one component of a comprehensive MRSA colonization surveillance program. It is not intended to diagnose MRSA infection nor to guide or monitor treatment for MRSA infections.      Studies: Dg Chest 2 View  09/09/2014   CLINICAL DATA:  81 year old male with fever, cough and congestion  EXAM: CHEST  2 VIEW  COMPARISON:  Prior chest x-ray 09/07/2014  FINDINGS: Overall, there has been some improvement in diffuse bilateral interstitial and airspace opacities. However, there remains some focal airspace opacification within the right mid lung. No pleural effusion. No  pneumothorax. Stable cardiomegaly and mediastinal contours. Atherosclerotic calcification remains present within the transverse aorta.  IMPRESSION: 1. Improving pulmonary edema. 2. Residual patchy airspace opacity in the right mid lung in the region of the minor fissure may represent superimposed infiltrate/pneumonia, or perhaps some fluid trapped within the minor fissure. 3. Stable cardiomegaly.   Electronically Signed   By: Jacqulynn Cadet M.D.   On: 09/09/2014 10:21    Scheduled Meds: . allopurinol  100 mg Oral QHS  . amiodarone  100 mg Oral Daily  . amLODipine  10 mg Oral Daily  . aspirin EC  81 mg Oral Daily  . atorvastatin  40 mg Oral q1800  . citalopram  20 mg Oral Daily  . heparin  5,000  Units Subcutaneous 3 times per day  . hydrALAZINE  100 mg Oral TID  . insulin aspart  0-9 Units Subcutaneous TID WC  . [START ON 09/11/2014] insulin detemir  30 Units Subcutaneous Daily  . isosorbide mononitrate  60 mg Oral Daily  . levothyroxine  125 mcg Oral QAC breakfast  . pantoprazole  40 mg Oral Daily  . piperacillin-tazobactam (ZOSYN)  IV  3.375 g Intravenous Q8H  . silver sulfADIAZINE   Topical Daily  . sodium chloride  3 mL Intravenous Q12H  . torsemide  40 mg Oral BID  . vancomycin  1,500 mg Intravenous Q48H   Continuous Infusions: . sodium chloride      Active Problems:   Type 2 diabetes mellitus with renal manifestations   Paroxysmal atrial fibrillation- in AF 03/10/14   PROSTATE CANCER, HX OF   Normochromic normocytic anemia   Chronic renal failure   Wound, open, foot   Diabetic foot ulcer    Time spent: 25 minutes    Hosie Poisson , MD  [facility] Pager [phone] If 7PM-7AM, please contact night-coverage at [email or web address], password [facility] 09/10/2014, 6:46 PM  LOS: 4 days

## 2014-09-11 LAB — VANCOMYCIN, TROUGH: Vancomycin Tr: 14 ug/mL (ref 10.0–20.0)

## 2014-09-11 LAB — GLUCOSE, CAPILLARY
GLUCOSE-CAPILLARY: 187 mg/dL — AB (ref 65–99)
Glucose-Capillary: 90 mg/dL (ref 65–99)
Glucose-Capillary: 141 mg/dL — ABNORMAL HIGH (ref 65–99)
Glucose-Capillary: 185 mg/dL — ABNORMAL HIGH (ref 65–99)

## 2014-09-11 NOTE — Progress Notes (Signed)
[facility] PROGRESS NOTE  [name] KXF:[id number] DOB: 1932-11-22 DOA: 09/06/2014 PCP: [name], MD  HPI/Subjective: Patient is a 81 y.o. male who reports having several years of problems with feet ulcers. These have been treated off and on with wound care clinic visits. He was admitted for open diabetic foot ulcers. Ortho was consulted and there is no plan for debridement at this time.    Assessment/Plan: Wound, open, foot: - Patient has chronic bilateral lower extremity sores, likely due to diabetes mellitus - Wound care has been consulted and has seen the patient at bedside.  - Ortho consulted and has seen the patient at beside; debridement                  not recommended, follow up Dr [name] Given as outpatient.  - Pharmacy consulted for MRSA coverage; Zosyn and  Vancoymycin  per pharmacy  - Norco 5-325 mg for pain and physical therapy.  - looked at the wounds and they appear to have worsened over the last 3 days. Would request orthopedics to evaluate again for debridement. Discussed with daughter.   Type 2 diabetes mellitus with renal manifestations - CBGs are still elevated but lower than admission - Continue sliding scale insulin with Novolog, cut the dose of levemir to 30 units after he had a hypoglycemic event on the night of 7/21              -  CBG (last 3)   Recent Labs  09/10/14 2201 09/11/14 0744 09/11/14 1243  GLUCAP 134* 90 141*      Myelodysplastic syndrome-hx of transfusions - hemoglobin is unchanged at 9.1   Sclerotic lesion on ribs on CXR  - patient has sclerotic lesions on the ribs on CXR, Dr [name] is aware of this finding as per his last note in epic. He will follow the patient as outpatient.   Hypothyroid - on synthroid 125 mcg  History of prostate cancer-15 yrs ago  - stable  Essential hypertension - on  hydralazine 100 mg tablet, clonidine patch 0.1 mg Better controlled.   Dyslipidemia - on lipitor  CAD, moderate - pt denies any chest pain.   Acute on CKD stage 4:  - improving, with diuresis.  - on torsemide.   Fever, CXR and V/Q scan showed multiple bilateral airspace disease:currently on vancomycin and zosyn.  One dose of lasix ordered for pulmonary edema. Repeat CXR shows improving pulmonary edema. Would recommend continue the antibiotics to complete the course of health care associated pneumonia.   Code Status: DNR Family Communication: none at bedside.   Disposition Plan: Home with home health services DVT Prophylaxis: Heparin inj 5,000 units   Consultants:  Orthopedics - met with patient at bedside 7/19; signed off  Pharmacy - antibiotic consult  Procedures:  None  Antibiotics:  Zosyn 3.375 g, IV, at 12.5 mL/hr, every 8 hours  Vancomycin every 48 hours renally dosed.     Objective: Filed Vitals:   09/11/14 1624  BP: 118/60  Pulse: 85  Temp: 98.8 F (37.1 C)  Resp: 20    Intake/Output Summary (Last 24 hours) at 09/11/14 1740 Last data filed at 09/11/14 0448  Gross per 24 hour  Intake    480 ml  Output      0 ml  Net    480 ml   Filed Weights   09/09/14 0452 09/10/14 0610 09/11/14 0516  Weight: 108.2 kg (238 lb 8.6 oz) 109.7 kg (241 lb 13.5 oz) 108.7 kg (  239 lb 10.2 oz)    Exam:   General:  Patient is alert and oriented, in no acute distress  Cardiovascular: no pedal edema  Respiratory: No cyanosis, no use of accessory musculature  Abdomen: Soft, non-tender, no organomegaly  Musculoskeletal: pressure dressings covering both legs from feet up to below the knee; skin underneath not visualized during exam  Data Reviewed: Basic Metabolic Panel:  Recent Labs Lab 09/06/14 0904 09/07/14 0437 09/08/14 0511 09/09/14 0415 09/10/14 0353  NA 135 138 142 143 145  K 4.9 5.4* 4.6 3.5 3.6  CL 108 110 110 109 110  CO2  18* 20* 21* 23 26  GLUCOSE 345* 219* 144* 43* 38*  BUN 87* 82* 73* 65* 56*  CREATININE 3.49* 3.34* 2.96* 2.74* 2.47*  CALCIUM 8.5* 8.7* 8.7* 8.8* 8.4*   Liver Function Tests:  Recent Labs Lab 09/06/14 0904 09/07/14 0437 09/08/14 0511  AST _0 ALT _1 ALKPHOS 166* 150* 160*  BILITOT 0.3 0.2* 0.5  PROT 7.8 7.0 7.1  ALBUMIN 2.7* 2.4* 2.4*   No results for input(s): LIPASE, AMYLASE in the last 168 hours. No results for input(s): AMMONIA in the last 168 hours. CBC:  Recent Labs Lab 09/06/14 0904 09/07/14 0437 09/08/14 0511 09/09/14 0415 09/10/14 0353  WBC 14.4* 16.5* 19.6* 17.5* 15.1*  NEUTROABS 12.6*  --   --   --   --   HGB 9.4* 9.4* 9.7* 9.5* 9.1*  HCT 30.6* 30.6* 30.9* 30.1* 29.5*  MCV 82.0 81.2 80.7 79.2 79.9  PLT 387 404* 457* 446* 497*   Cardiac Enzymes: No results for input(s): CKTOTAL, CKMB, CKMBINDEX, TROPONINI in the last 168 hours. BNP (last 3 results)  Recent Labs  03/10/14 1039 04/28/14 1530  BNP 456.3* 153.7*    ProBNP (last 3 results)  Recent Labs  10/21/13 2012 11/28/13 1215 01/27/14 1709  PROBNP 1541.0* 1597.0* 2020.0*    CBG:  Recent Labs Lab 09/10/14 1159 09/10/14 1639 09/10/14 2201 09/11/14 0744 09/11/14 1243  GLUCAP 96 142* 134* 90 141*    Recent Results (from the past 240 hour(s))  MRSA PCR Screening     Status: None   Collection Time: 09/06/14 10:07 PM  Result Value Ref Range Status   MRSA by PCR NEGATIVE NEGATIVE Final    Comment:        The GeneXpert MRSA Assay (FDA approved for NASAL specimens only), is one component of a comprehensive MRSA colonization surveillance program. It is not intended to diagnose MRSA infection nor to guide or monitor treatment for MRSA infections.      Studies: No results found.  Scheduled Meds: . allopurinol  100 mg Oral QHS  . amiodarone  100 mg Oral Daily  . amLODipine  10 mg Oral Daily  . aspirin EC  81 mg Oral Daily  . atorvastatin  40 mg Oral q1800  .  citalopram  20 mg Oral Daily  . heparin  5,000 Units Subcutaneous 3 times per day  . hydrALAZINE  100 mg Oral TID  . insulin aspart  0-9 Units Subcutaneous TID WC  . insulin detemir  30 Units Subcutaneous Daily  . isosorbide mononitrate  60 mg Oral Daily  . levothyroxine  125 mcg Oral QAC breakfast  . pantoprazole  40 mg Oral Daily  . piperacillin-tazobactam (ZOSYN)  IV  3.375 g Intravenous Q8H  . silver sulfADIAZINE   Topical Daily  . sodium chloride  3 mL Intravenous Q12H  . torsemide  40 mg Oral  BID  . vancomycin  1,500 mg Intravenous Q48H   Continuous Infusions: . sodium chloride      Active Problems:   Type 2 diabetes mellitus with renal manifestations   Paroxysmal atrial fibrillation- in AF 03/10/14   PROSTATE CANCER, HX OF   Normochromic normocytic anemia   Chronic renal failure   Wound, open, foot   Diabetic foot ulcer    Time spent: 25 minutes    Hosie Poisson , MD  [facility] Pager [phone] If 7PM-7AM, please contact night-coverage at [email or web address], password [facility] 09/11/2014, 5:40 PM  LOS: 5 days

## 2014-09-11 NOTE — Progress Notes (Signed)
ANTIBIOTIC CONSULT NOTE - FOLLOW UP  Pharmacy Consult for Vancomycin, Zosyn Indication: foot wound, HCAP  Allergies  Allergen Reactions  . Ace Inhibitors Other (See Comments)    HYPOTENSION  . Beta Adrenergic Blockers Other (See Comments)     use cautiously secondary to 2nd degree heart block, hypotension    Patient Measurements: Height: 5' 10.5" (179.1 cm) Weight: 239 lb 10.2 oz (108.7 kg) IBW/kg (Calculated) : 74.15  Vital Signs: Temp: 98.6 F (37 C) (07/23 0421) Temp Source: Oral (07/23 0421) BP: 125/64 mmHg (07/23 0421) Pulse Rate: 94 (07/23 0421) Intake/Output from previous day: 07/22 0701 - 07/23 0700 In: 960 [P.O.:960] Out: 125 [Urine:125] Intake/Output from this shift:    Labs:  Recent Labs  09/09/14 0415 09/10/14 0353  WBC 17.5* 15.1*  HGB 9.5* 9.1*  PLT 446* 497*  CREATININE 2.74* 2.47*   Estimated Creatinine Clearance: 29.7 mL/min (by C-G formula based on Cr of 2.47).  Recent Labs  09/11/14 0850  [facility] 14     Microbiology: Recent Results (from the past 720 hour(s))  MRSA PCR Screening     Status: None   Collection Time: 09/06/14 10:07 PM  Result Value Ref Range Status   MRSA by PCR NEGATIVE NEGATIVE Final    Comment:        The GeneXpert MRSA Assay (FDA approved for NASAL specimens only), is one component of a comprehensive MRSA colonization surveillance program. It is not intended to diagnose MRSA infection nor to guide or monitor treatment for MRSA infections.     Anti-infectives    Start     Dose/Rate Route Frequency Ordered Stop   09/09/14 1000  vancomycin (VANCOCIN) 1,500 mg in sodium chloride 0.9 % 500 mL IVPB     1,500 mg 250 mL/hr over 120 Minutes Intravenous Every 48 hours 09/07/14 0849     09/07/14 1600  piperacillin-tazobactam (ZOSYN) IVPB 3.375 g     3.375 g 12.5 mL/hr over 240 Minutes Intravenous Every 8 hours 09/07/14 0849     09/07/14 0900  vancomycin (VANCOCIN) 2,250 mg in sodium chloride 0.9 % 500 mL IVPB      2,250 mg 250 mL/hr over 120 Minutes Intravenous  Once 09/07/14 0820 09/07/14 1141   09/07/14 0900  piperacillin-tazobactam (ZOSYN) IVPB 3.375 g     3.375 g 100 mL/hr over 30 Minutes Intravenous  Once 09/07/14 0849 09/07/14 1011      Assessment: 81 y.o. male with PMH DM, CKD, CAD admitted 09/06/2014 for bilateral painful LE sores, most likely from chronic DMT2. Worsening chronic edema and difficulty ambulating d/t pain. Seen by wound care; no osteo on X-ray, but today with new leukocytosis. MD covering for DM foot wound with vancomycin and Zosyn.  Now with possible PNA vs pulmonary edema; extending coverage for HCAP.  7/19 >> Vanc >> 7/19 >> Zosyn >>     Temp: low fevers on 7/19; </= 99 since WBC: elevated but trending down Renal: SCr at baseline(2 - 2.5); CrCl 30 CG, 24 N 7/19 CXR shows bilat infiltr (edema > PNA) 7/20 VQ scan w/ severe bilat vent defects 7/21 CXR shows pulm edema with possible superimposed infiltrate/PNA  No cultures MRSA screen neg  7/23: 0900 VT = 14 before 2nd dose of 1500mg  q48h regimen.   Goal of Therapy:  Vancomycin trough level 15-20 mcg/ml  Eradication of infection Appropriate antibiotic dosing for indication and renal function  Plan:  - Continue Day 5 Zosyn 3.375 g IV given every 8 hrs by 4-hr infusion -  Continue Day 5 Vanc 1500mg  IV q48h as vanc is likely to accumulate as he reaches steady-state. - Follow clinical course, renal function, culture results as available - Follow for de-escalation of antibiotics and LOT. Standard duration for HCAP is 8 days, but given improvement in Temps/WBC, would consider narrowing to PO antibiotics to complete therapy.  Romeo Rabon, PharmD, pager [phone]. 09/11/2014,10:10 AM.

## 2014-09-11 NOTE — Care Management (Signed)
Medicare Important Message given? YES  Date Medicare IM given:09/11/14  Medicare IM given by: Jhoel Stieg RN CCM 

## 2014-09-12 LAB — GLUCOSE, CAPILLARY
GLUCOSE-CAPILLARY: 142 mg/dL — AB (ref 65–99)
Glucose-Capillary: 153 mg/dL — ABNORMAL HIGH (ref 65–99)
Glucose-Capillary: 155 mg/dL — ABNORMAL HIGH (ref 65–99)
Glucose-Capillary: 132 mg/dL — ABNORMAL HIGH (ref 65–99)

## 2014-09-12 LAB — CBC WITH DIFFERENTIAL/PLATELET
BASOS ABS: 0 10*3/uL (ref 0.0–0.1)
Basophils Relative: 0 % (ref 0–1)
EOS PCT: 3 % (ref 0–5)
Eosinophils Absolute: 0.5 10*3/uL (ref 0.0–0.7)
HEMATOCRIT: 29.7 % — AB (ref 39.0–52.0)
HEMOGLOBIN: 8.9 g/dL — AB (ref 13.0–17.0)
LYMPHS ABS: 1.2 10*3/uL (ref 0.7–4.0)
LYMPHS PCT: 7 % — AB (ref 12–46)
MCH: 24.7 pg — ABNORMAL LOW (ref 26.0–34.0)
MCHC: 30 g/dL (ref 30.0–36.0)
MCV: 82.5 fL (ref 78.0–100.0)
MONO ABS: 0.6 10*3/uL (ref 0.1–1.0)
Monocytes Relative: 4 % (ref 3–12)
NEUTROS ABS: 14.2 10*3/uL — AB (ref 1.7–7.7)
Neutrophils Relative %: 86 % — ABNORMAL HIGH (ref 43–77)
Platelets: 412 10*3/uL — ABNORMAL HIGH (ref 150–400)
RBC: 3.6 MIL/uL — AB (ref 4.22–5.81)
RDW: 19.7 % — ABNORMAL HIGH (ref 11.5–15.5)
WBC: 16.5 10*3/uL — AB (ref 4.0–10.5)

## 2014-09-12 LAB — BASIC METABOLIC PANEL
Anion gap: 8 (ref 5–15)
BUN: 53 mg/dL — AB (ref 6–20)
CO2: 25 mmol/L (ref 22–32)
Calcium: 8.2 mg/dL — ABNORMAL LOW (ref 8.9–10.3)
Chloride: 106 mmol/L (ref 101–111)
Creatinine, Ser: 2.96 mg/dL — ABNORMAL HIGH (ref 0.61–1.24)
GFR calc Af Amer: 22 mL/min — ABNORMAL LOW (ref >60)
GFR calc non Af Amer: 19 mL/min — ABNORMAL LOW (ref >60)
Glucose, Bld: 155 mg/dL — ABNORMAL HIGH (ref 65–99)
POTASSIUM: 3.5 mmol/L (ref 3.5–5.1)
Sodium: 139 mmol/L (ref 135–145)

## 2014-09-12 NOTE — Progress Notes (Signed)
Patient went into second degree type 2 heart block. Vital signs were taken and they were stable. When EKG was taken, patient had went back into 1st degree HB. This happened during the day as well. NP on call notified. No new orders given.

## 2014-09-12 NOTE — Progress Notes (Signed)
We have communicated with Dr. [name] Given, and he is planning on seeing the patient to evaluate his foot ulcer in the morning after the transfer to [facility].  Johnny Bridge, MD

## 2014-09-12 NOTE — Progress Notes (Signed)
[facility] PROGRESS NOTE  [name] OXB:[id number] DOB: 01-29-1933 DOA: 09/06/2014 PCP: [name], MD  HPI/Subjective: Patient is a 81 y.o. male who reports having several years of problems with feet ulcers. These have been treated off and on with wound care clinic visits. He was admitted for open diabetic foot ulcers. Ortho was consulted and there is no plan for debridement on 7/18. But over the course of the next 3 days, his leg wounds have worsened and we have requested orthopedics to evaluate for debridement. Dr [name] Given requested for [facility] .    Assessment/Plan: Wound, open, foot: - Patient has chronic bilateral lower extremity sores, likely due to diabetes mellitus - Wound care has been consulted and has seen the patient at bedside.  - Ortho consulted and has seen the patient at beside; debridement                  not recommended, follow up Dr [name] Given as outpatient, initially on 7/18.   - Pharmacy consulted for MRSA coverage; Zosyn and  Vancomycin  per pharmacy  - Norco 5-325 mg for pain and physical therapy.  - looked at the wounds and they appear to have worsened over the last 3 days. Would request orthopedics to evaluate again for debridement. Transfer to [facility] today for evaluation by Dr [name] Given.   Type 2 diabetes mellitus with renal manifestations  - Continue sliding scale insulin with Novolog, cut the dose of levemir to 30 units after he had a hypoglycemic event on the night of 7/21.              -  CBG (last 3)   Recent Labs  09/12/14 0746 09/12/14 1207 09/12/14 1637  GLUCAP 142* 153* 155*      Myelodysplastic syndrome-hx of transfusions - hemoglobin is unchanged at 9.1   Sclerotic lesion on ribs on CXR  - patient has sclerotic lesions on the ribs on CXR, Dr [name] is aware of this finding as per his last note in epic. He will follow the patient as  outpatient.   Hypothyroid - on synthroid 125 mcg  History of prostate cancer-15 yrs ago  - stable  Essential hypertension - on hydralazine 100 mg tablet, clonidine patch 0.1 mg Better controlled.   Dyslipidemia - on lipitor  CAD, moderate - pt denies any chest pain.   Acute on CKD stage 4:  - improving, with diuresis.  - on torsemide.   Fever, CXR and V/Q scan showed multiple bilateral airspace disease:currently on vancomycin and zosyn.  One dose of lasix ordered for pulmonary edema. Repeat CXR shows improving pulmonary edema. Would recommend continue the antibiotics to complete the course of possibly  health care associated pneumonia superimposed on pulmonary edema. .   Code Status: DNR Family Communication: none at bedside.   Disposition Plan: Home with home health services DVT Prophylaxis: Heparin inj 5,000 units   Consultants:  Orthopedics - met with patient at bedside 7/19; signed off  Pharmacy - antibiotic consult  Procedures:  None  Antibiotics:  Zosyn 3.375 g, IV, at 12.5 mL/hr, every 8 hours  Vancomycin every 48 hours renally dosed.     Objective: Filed Vitals:   09/12/14 1447  BP: 133/55  Pulse: 80  Temp: 98.5 F (36.9 C)  Resp: 18    Intake/Output Summary (Last 24 hours) at 09/12/14 1754 Last data filed at 09/12/14 1447  Gross per 24 hour  Intake    540 ml  Output  675 ml  Net   -135 ml   Filed Weights   09/10/14 0610 09/11/14 0516 09/12/14 0528  Weight: 109.7 kg (241 lb 13.5 oz) 108.7 kg (239 lb 10.2 oz) 109.4 kg (241 lb 2.9 oz)    Exam:   General:  Patient is alert and oriented, in no acute distress  Cardiovascular: no pedal edema  Respiratory: No cyanosis, no use of accessory musculature  Abdomen: Soft, non-tender, no organomegaly  Musculoskeletal: multiple weeping bilateral leg wounds , foul smelling, draining serosanguinous fluid.   Data Reviewed: Basic  Metabolic Panel:  Recent Labs Lab 09/07/14 0437 09/08/14 0511 09/09/14 0415 09/10/14 0353 09/12/14 1055  NA 138 142 143 145 139  K 5.4* 4.6 3.5 3.6 3.5  CL 110 110 109 110 106  CO2 20* 21* _0 GLUCOSE 219* 144* 43* 38* 155*  BUN 82* 73* 65* 56* 53*  CREATININE 3.34* 2.96* 2.74* 2.47* 2.96*  CALCIUM 8.7* 8.7* 8.8* 8.4* 8.2*   Liver Function Tests:  Recent Labs Lab 09/06/14 0904 09/07/14 0437 09/08/14 0511  AST _1 ALT _2 ALKPHOS 166* 150* 160*  BILITOT 0.3 0.2* 0.5  PROT 7.8 7.0 7.1  ALBUMIN 2.7* 2.4* 2.4*   No results for input(s): LIPASE, AMYLASE in the last 168 hours. No results for input(s): AMMONIA in the last 168 hours. CBC:  Recent Labs Lab 09/06/14 0904 09/07/14 0437 09/08/14 0511 09/09/14 0415 09/10/14 0353 09/12/14 1055  WBC 14.4* 16.5* 19.6* 17.5* 15.1* 16.5*  NEUTROABS 12.6*  --   --   --   --  14.2*  HGB 9.4* 9.4* 9.7* 9.5* 9.1* 8.9*  HCT 30.6* 30.6* 30.9* 30.1* 29.5* 29.7*  MCV 82.0 81.2 80.7 79.2 79.9 82.5  PLT 387 404* 457* 446* 497* 412*   Cardiac Enzymes: No results for input(s): CKTOTAL, CKMB, CKMBINDEX, TROPONINI in the last 168 hours. BNP (last 3 results)  Recent Labs  03/10/14 1039 04/28/14 1530  BNP 456.3* 153.7*    ProBNP (last 3 results)  Recent Labs  10/21/13 2012 11/28/13 1215 01/27/14 1709  PROBNP 1541.0* 1597.0* 2020.0*    CBG:  Recent Labs Lab 09/11/14 1621 09/11/14 2150 09/12/14 0746 09/12/14 1207 09/12/14 1637  GLUCAP 187* 185* 142* 153* 155*    Recent Results (from the past 240 hour(s))  MRSA PCR Screening     Status: None   Collection Time: 09/06/14 10:07 PM  Result Value Ref Range Status   MRSA by PCR NEGATIVE NEGATIVE Final    Comment:        The GeneXpert MRSA Assay (FDA approved for NASAL specimens only), is one component of a comprehensive MRSA colonization surveillance program. It is not intended to diagnose MRSA infection nor to guide or monitor treatment  for MRSA infections.      Studies: No results found.  Scheduled Meds: . allopurinol  100 mg Oral QHS  . amiodarone  100 mg Oral Daily  . amLODipine  10 mg Oral Daily  . aspirin EC  81 mg Oral Daily  . atorvastatin  40 mg Oral q1800  . citalopram  20 mg Oral Daily  . heparin  5,000 Units Subcutaneous 3 times per day  . hydrALAZINE  100 mg Oral TID  . insulin aspart  0-9 Units Subcutaneous TID WC  . insulin detemir  30 Units Subcutaneous Daily  . isosorbide mononitrate  60 mg Oral Daily  . levothyroxine  125 mcg Oral QAC breakfast  . pantoprazole  40  mg Oral Daily  . piperacillin-tazobactam (ZOSYN)  IV  3.375 g Intravenous Q8H  . silver sulfADIAZINE   Topical Daily  . sodium chloride  3 mL Intravenous Q12H  . torsemide  40 mg Oral BID  . vancomycin  1,500 mg Intravenous Q48H   Continuous Infusions: . sodium chloride      Active Problems:   Type 2 diabetes mellitus with renal manifestations   Paroxysmal atrial fibrillation- in AF 03/10/14   PROSTATE CANCER, HX OF   Normochromic normocytic anemia   Chronic renal failure   Wound, open, foot   Diabetic foot ulcer    Time spent: 25 minutes    Hosie Poisson , MD  [facility] Pager [phone] If 7PM-7AM, please contact night-coverage at [email or web address], password [facility] 09/12/2014, 5:54 PM  LOS: 6 days

## 2014-09-13 ENCOUNTER — Inpatient Hospital Stay (HOSPITAL_COMMUNITY): Payer: Medicare PPO | Admitting: Anesthesiology

## 2014-09-13 ENCOUNTER — Encounter (HOSPITAL_COMMUNITY)
Admission: EM | Disposition: A | Discharge status: Skilled Nursing Facility | Payer: Self-pay | Source: Home / Self Care | Attending: Internal Medicine

## 2014-09-13 ENCOUNTER — Encounter (HOSPITAL_COMMUNITY): Payer: Self-pay | Admitting: Anesthesiology

## 2014-09-13 ENCOUNTER — Telehealth: Payer: Self-pay

## 2014-09-13 HISTORY — PX: AMPUTATION: SHX166

## 2014-09-13 LAB — CBC WITH DIFFERENTIAL/PLATELET
BASOS ABS: 0 10*3/uL (ref 0.0–0.1)
BASOS PCT: 0 % (ref 0–1)
Eosinophils Absolute: 0.4 10*3/uL (ref 0.0–0.7)
Eosinophils Relative: 2 % (ref 0–5)
HEMATOCRIT: 29.7 % — AB (ref 39.0–52.0)
Hemoglobin: 9 g/dL — ABNORMAL LOW (ref 13.0–17.0)
LYMPHS ABS: 0.8 10*3/uL (ref 0.7–4.0)
LYMPHS PCT: 5 % — AB (ref 12–46)
MCH: 24.5 pg — ABNORMAL LOW (ref 26.0–34.0)
MCHC: 30.3 g/dL (ref 30.0–36.0)
MCV: 80.7 fL (ref 78.0–100.0)
Monocytes Absolute: 1.1 10*3/uL — ABNORMAL HIGH (ref 0.1–1.0)
Monocytes Relative: 7 % (ref 3–12)
NEUTROS ABS: 14.1 10*3/uL — AB (ref 1.7–7.7)
Neutrophils Relative %: 86 % — ABNORMAL HIGH (ref 43–77)
PLATELETS: 415 10*3/uL — AB (ref 150–400)
RBC: 3.68 MIL/uL — ABNORMAL LOW (ref 4.22–5.81)
RDW: 19.6 % — ABNORMAL HIGH (ref 11.5–15.5)
WBC: 16.4 10*3/uL — ABNORMAL HIGH (ref 4.0–10.5)

## 2014-09-13 LAB — COMPREHENSIVE METABOLIC PANEL
ALBUMIN: 1.8 g/dL — AB (ref 3.5–5.0)
ALT: 22 U/L (ref 17–63)
ANION GAP: 9 (ref 5–15)
AST: 24 U/L (ref 15–41)
Alkaline Phosphatase: 120 U/L (ref 38–126)
BILIRUBIN TOTAL: 0.6 mg/dL (ref 0.3–1.2)
BUN: 51 mg/dL — ABNORMAL HIGH (ref 6–20)
CHLORIDE: 111 mmol/L (ref 101–111)
CO2: 23 mmol/L (ref 22–32)
Calcium: 8.4 mg/dL — ABNORMAL LOW (ref 8.9–10.3)
Creatinine, Ser: 3.21 mg/dL — ABNORMAL HIGH (ref 0.61–1.24)
GFR calc Af Amer: 20 mL/min — ABNORMAL LOW (ref >60)
GFR calc non Af Amer: 17 mL/min — ABNORMAL LOW (ref >60)
GLUCOSE: 130 mg/dL — AB (ref 65–99)
Potassium: 3.8 mmol/L (ref 3.5–5.1)
SODIUM: 143 mmol/L (ref 135–145)
Total Protein: 6.5 g/dL (ref 6.5–8.1)

## 2014-09-13 LAB — TYPE AND SCREEN
ABO/RH(D): O POS
Antibody Screen: NEGATIVE

## 2014-09-13 LAB — PROTIME-INR
INR: 1.25 (ref 0.00–1.49)
Prothrombin Time: 15.8 seconds — ABNORMAL HIGH (ref 11.6–15.2)

## 2014-09-13 LAB — GLUCOSE, CAPILLARY
GLUCOSE-CAPILLARY: 146 mg/dL — AB (ref 65–99)
GLUCOSE-CAPILLARY: 144 mg/dL — AB (ref 65–99)
Glucose-Capillary: 145 mg/dL — ABNORMAL HIGH (ref 65–99)
Glucose-Capillary: 120 mg/dL — ABNORMAL HIGH (ref 65–99)
Glucose-Capillary: 107 mg/dL — ABNORMAL HIGH (ref 65–99)
Glucose-Capillary: 155 mg/dL — ABNORMAL HIGH (ref 65–99)

## 2014-09-13 SURGERY — AMPUTATION BELOW KNEE
Anesthesia: General | Site: Leg Upper | Laterality: Right

## 2014-09-13 MED ORDER — CEFAZOLIN SODIUM-DEXTROSE 2-3 GM-% IV SOLR: 2.0000 g | INTRAVENOUS | Status: DC

## 2014-09-13 MED ORDER — PROPOFOL 10 MG/ML IV BOLUS
INTRAVENOUS | Status: AC
  Filled 2014-09-13: qty 20

## 2014-09-13 MED ORDER — METOCLOPRAMIDE HCL 5 MG/ML IJ SOLN: 5.0000 mg | Freq: Three times a day (TID) | INTRAMUSCULAR | Status: DC | PRN

## 2014-09-13 MED ORDER — ONDANSETRON HCL 4 MG PO TABS: 4.0000 mg | ORAL_TABLET | Freq: Four times a day (QID) | ORAL | Status: DC | PRN

## 2014-09-13 MED ORDER — METHOCARBAMOL 500 MG PO TABS
500.0000 mg | ORAL_TABLET | Freq: Four times a day (QID) | ORAL | Status: DC | PRN
  Administered 2014-09-14 – 2014-09-23 (×8): 500 mg via ORAL
  Filled 2014-09-13 (×9): qty 1

## 2014-09-13 MED ORDER — FENTANYL CITRATE (PF) 250 MCG/5ML IJ SOLN
INTRAMUSCULAR | Status: AC
  Filled 2014-09-13: qty 5

## 2014-09-13 MED ORDER — HYDROMORPHONE HCL 1 MG/ML IJ SOLN: 0.2500 mg | INTRAMUSCULAR | Status: DC | PRN

## 2014-09-13 MED ORDER — LIDOCAINE HCL (CARDIAC) 20 MG/ML IV SOLN
INTRAVENOUS | Status: DC | PRN
  Administered 2014-09-13: 60 mg via INTRAVENOUS

## 2014-09-13 MED ORDER — 0.9 % SODIUM CHLORIDE (POUR BTL) OPTIME
TOPICAL | Status: DC | PRN
  Administered 2014-09-13: 1000 mL

## 2014-09-13 MED ORDER — MIDAZOLAM HCL 2 MG/2ML IJ SOLN: 0.5000 mg | Freq: Once | INTRAMUSCULAR | Status: DC | PRN

## 2014-09-13 MED ORDER — ACETAMINOPHEN 325 MG PO TABS
650.0000 mg | ORAL_TABLET | Freq: Four times a day (QID) | ORAL | Status: DC | PRN
  Administered 2014-09-14 – 2014-09-21 (×5): 650 mg via ORAL
  Filled 2014-09-13 (×6): qty 2

## 2014-09-13 MED ORDER — ACETAMINOPHEN 650 MG RE SUPP: 650.0000 mg | Freq: Four times a day (QID) | RECTAL | Status: DC | PRN

## 2014-09-13 MED ORDER — METOCLOPRAMIDE HCL 5 MG PO TABS: 5.0000 mg | ORAL_TABLET | Freq: Three times a day (TID) | ORAL | Status: DC | PRN

## 2014-09-13 MED ORDER — ONDANSETRON HCL 4 MG/2ML IJ SOLN
INTRAMUSCULAR | Status: AC
  Filled 2014-09-13: qty 2

## 2014-09-13 MED ORDER — CEFAZOLIN SODIUM-DEXTROSE 2-3 GM-% IV SOLR
INTRAVENOUS | Status: AC
  Filled 2014-09-13: qty 50

## 2014-09-13 MED ORDER — ONDANSETRON HCL 4 MG/2ML IJ SOLN: 4.0000 mg | Freq: Four times a day (QID) | INTRAMUSCULAR | Status: DC | PRN

## 2014-09-13 MED ORDER — PROPOFOL 10 MG/ML IV BOLUS
INTRAVENOUS | Status: DC | PRN
  Administered 2014-09-13: 130 mg via INTRAVENOUS

## 2014-09-13 MED ORDER — PROMETHAZINE HCL 25 MG/ML IJ SOLN: 6.2500 mg | INTRAMUSCULAR | Status: DC | PRN

## 2014-09-13 MED ORDER — MORPHINE SULFATE 2 MG/ML IJ SOLN
1.0000 mg | INTRAMUSCULAR | Status: DC | PRN
  Administered 2014-09-13 (×3): 2 mg via INTRAVENOUS
  Administered 2014-09-14: 1 mg via INTRAVENOUS
  Administered 2014-09-14: 2 mg via INTRAVENOUS
  Administered 2014-09-15: 1 mg via INTRAVENOUS
  Administered 2014-09-16 – 2014-09-21 (×14): 2 mg via INTRAVENOUS
  Filled 2014-09-13 (×21): qty 1

## 2014-09-13 MED ORDER — MEPERIDINE HCL 25 MG/ML IJ SOLN: 6.2500 mg | INTRAMUSCULAR | Status: DC | PRN

## 2014-09-13 MED ORDER — SODIUM CHLORIDE 0.9 % IV SOLN
INTRAVENOUS | Status: DC
  Administered 2014-09-13: 20:00:00 via INTRAVENOUS

## 2014-09-13 MED ORDER — METHOCARBAMOL 1000 MG/10ML IJ SOLN
500.0000 mg | Freq: Four times a day (QID) | INTRAVENOUS | Status: DC | PRN
  Filled 2014-09-13: qty 5

## 2014-09-13 MED ORDER — FENTANYL CITRATE (PF) 100 MCG/2ML IJ SOLN
INTRAMUSCULAR | Status: DC | PRN
  Administered 2014-09-13: 50 ug via INTRAVENOUS
  Administered 2014-09-13: 25 ug via INTRAVENOUS
  Administered 2014-09-13: 50 ug via INTRAVENOUS
  Administered 2014-09-13: 25 ug via INTRAVENOUS

## 2014-09-13 MED ORDER — MIDAZOLAM HCL 2 MG/2ML IJ SOLN
INTRAMUSCULAR | Status: AC
  Filled 2014-09-13: qty 2

## 2014-09-13 SURGICAL SUPPLY — 39 items
BLADE SAW RECIP 87.9 MT (BLADE) ×3 IMPLANT
BLADE SURG 21 STRL SS (BLADE) ×3 IMPLANT
BNDG COHESIVE 4X5 TAN STRL (GAUZE/BANDAGES/DRESSINGS) ×2 IMPLANT
BNDG COHESIVE 6X5 TAN STRL LF (GAUZE/BANDAGES/DRESSINGS) ×2 IMPLANT
BNDG GAUZE ELAST 4 BULKY (GAUZE/BANDAGES/DRESSINGS) ×2 IMPLANT
CANISTER SUCTION 2500CC (MISCELLANEOUS) ×2 IMPLANT
COVER SURGICAL LIGHT HANDLE (MISCELLANEOUS) ×6 IMPLANT
CUFF TOURNIQUET SINGLE 34IN LL (TOURNIQUET CUFF) IMPLANT
CUFF TOURNIQUET SINGLE 44IN (TOURNIQUET CUFF) IMPLANT
DRAPE EXTREMITY T 121X128X90 (DRAPE) ×3 IMPLANT
DRAPE PROXIMA HALF (DRAPES) ×6 IMPLANT
DRAPE U-SHAPE 47X51 STRL (DRAPES) ×3 IMPLANT
DRSG ADAPTIC 3X8 NADH LF (GAUZE/BANDAGES/DRESSINGS) ×1 IMPLANT
DRSG AQUACEL AG ADV 3.5X10 (GAUZE/BANDAGES/DRESSINGS) ×2 IMPLANT
DRSG PAD ABDOMINAL 8X10 ST (GAUZE/BANDAGES/DRESSINGS) ×1 IMPLANT
DURAPREP 26ML APPLICATOR (WOUND CARE) ×3 IMPLANT
ELECT REM PT RETURN 9FT ADLT (ELECTROSURGICAL) ×3
ELECTRODE REM PT RTRN 9FT ADLT (ELECTROSURGICAL) ×1 IMPLANT
GAUZE SPONGE 4X4 12PLY STRL (GAUZE/BANDAGES/DRESSINGS) ×1 IMPLANT
GLOVE BIOGEL PI IND STRL 9 (GLOVE) ×1 IMPLANT
GLOVE BIOGEL PI INDICATOR 9 (GLOVE) ×2
GLOVE SURG ORTHO 9.0 STRL STRW (GLOVE) ×3 IMPLANT
GOWN STRL REUS W/ TWL XL LVL3 (GOWN DISPOSABLE) ×2 IMPLANT
GOWN STRL REUS W/TWL XL LVL3 (GOWN DISPOSABLE) ×6
KIT BASIN OR (CUSTOM PROCEDURE TRAY) ×3 IMPLANT
KIT ROOM TURNOVER OR (KITS) ×3 IMPLANT
MANIFOLD NEPTUNE II (INSTRUMENTS) ×1 IMPLANT
NS IRRIG 1000ML POUR BTL (IV SOLUTION) ×3 IMPLANT
PACK GENERAL/GYN (CUSTOM PROCEDURE TRAY) ×3 IMPLANT
PAD ARMBOARD 7.5X6 YLW CONV (MISCELLANEOUS) ×6 IMPLANT
SPONGE LAP 18X18 X RAY DECT (DISPOSABLE) IMPLANT
STAPLER VISISTAT 35W (STAPLE) IMPLANT
STOCKINETTE IMPERVIOUS LG (DRAPES) ×3 IMPLANT
SUT SILK 2 0 (SUTURE) ×3
SUT SILK 2-0 18XBRD TIE 12 (SUTURE) ×1 IMPLANT
SUT VIC AB 1 CTX 27 (SUTURE) IMPLANT
TOWEL OR 17X24 6PK STRL BLUE (TOWEL DISPOSABLE) ×3 IMPLANT
TOWEL OR 17X26 10 PK STRL BLUE (TOWEL DISPOSABLE) ×1 IMPLANT
WATER STERILE IRR 1000ML POUR (IV SOLUTION) ×1 IMPLANT

## 2014-09-13 NOTE — Care Management Note (Signed)
Case Management Note  Patient Details  Name: SHADI SESSLER MRN: [id number] Date of Birth: Nov 29, 1932  Subjective/Objective:         CM following for progression and d/c planning.           Action/Plan:  Per report this pt has [place], [facility] nurse visiting twice a day. However, as this would be very unlikely as HH generally does not provide this service. This CM contacted [facility] and was informed that this pt case was closed in March 2016 and they have not provided services since that time. As these visits are more likely from a [facility] [employer] aide and an agency providing ongoing services , this CM will attempt to learn what agency is supplying the [facility] aide.   Expected Discharge Date:  09/27/2014              Expected Discharge Plan:  Skilled Nursing Facility  In-House Referral:  Clinical Social Work  Discharge planning Services  CM Consult  Post Acute Care Choice:    Choice offered to:     DME Arranged:    DME Agency:     HH Arranged:    [place] Agency:     Status of Service:  In process, will continue to follow  Medicare Important Message Given:  Yes-third notification given Date Medicare IM Given:    Medicare IM give by:    Date Additional Medicare IM Given:    Additional Medicare Important Message give by:     If discussed at [place] of Stay Meetings, dates discussed:    Additional Comments:  Adron Bene, RN 09/13/2014, 4:15 PM

## 2014-09-13 NOTE — Anesthesia Procedure Notes (Signed)
Procedure Name: LMA Insertion Date/Time: 09/13/2014 6:03 PM Performed by: Susa Loffler Pre-anesthesia Checklist: Patient identified, Emergency Drugs available, Suction available, Patient being monitored and Timeout performed Patient Re-evaluated:Patient Re-evaluated prior to inductionOxygen Delivery Method: Circle system utilized Preoxygenation: Pre-oxygenation with 100% oxygen Intubation Type: IV induction LMA: LMA inserted LMA Size: 4.0 Number of attempts: 1 Placement Confirmation: positive ETCO2 and breath sounds checked- equal and bilateral Tube secured with: Tape Dental Injury: Teeth and Oropharynx as per pre-operative assessment

## 2014-09-13 NOTE — Progress Notes (Signed)
Called OR short stay CN to verify if the following meds we okay to be given prior to surgery: pacerone, Norvasc, hydralazine, and Imdur. Per CN it is okay for pt to take the previously mentioned meds.

## 2014-09-13 NOTE — Op Note (Signed)
09/06/2014 - 09/13/2014  6:34 PM  PATIENT:  [name]    PRE-OPERATIVE DIAGNOSIS:  necrotic right foot and right leg  POST-OPERATIVE DIAGNOSIS:  Same  PROCEDURE:  Right above-the-knee amputation  SURGEON:  Newt Minion, MD  PHYSICIAN ASSISTANT:None ANESTHESIA:   General  PREOPERATIVE INDICATIONS:  [name] is a  81 y.o. male with a diagnosis of necrotic right foot who failed conservative measures and elected for surgical management.    The risks benefits and alternatives were discussed with the patient preoperatively including but not limited to the risks of infection, bleeding, nerve injury, cardiopulmonary complications, the need for revision surgery, among others, and the patient was willing to proceed.  OPERATIVE IMPLANTS: None  OPERATIVE FINDINGS: Minimal petechial bleeding  OPERATIVE PROCEDURE: Patient was brought to the operating room and underwent a general and aesthetic. After adequate levels anesthesia were obtained patient's right lower extremity was prepped using DuraPrep draped into a sterile field. This incision was made to go with an above-the-knee amputation due to necrotic ulcerations which extended proximal to the mid aspect of his calf. A timeout was called. A fishmouth incision was made through the mid thigh. A Bovie was used to dissect down to the bone the bone was cut with the reciprocating saw. The posterior skin was incised the sciatic nerve was pulled cut and allowed retract the femoral vessels were clamped and suture ligated with 2-0 silk. Hemostasis was obtained. The leg was delivered off the field. The incision was closed using 2-0 nylon the deep superficial fascial layers and skin were closed using 2-0 nylon. A Aquacel Ag dressing was applied. Patient was extubated taken to the PACU in stable condition.

## 2014-09-13 NOTE — Consult Note (Signed)
Reason for Consult: Necrotic gangrene with ulceration right foot Referring Physician: Dr. Octavia Bruckner is an 81 y.o. male.  HPI: Patient is an 81 year old gentleman who presents with necrotic ulcers bilateral lower extremities. Patient has multiple medical problems including diabetes end stage renal disease history of stroke history of cardiac disease history of chronic obstructive pulmonary disease.  Past Medical History  Diagnosis Date  . Chronic diastolic heart failure     secondary to diastolic dysfunction EF (previous EF 35-45%)ef 60%4/09  . Arrhythmia     atrial fibrillation /pt on amiodarone,thought to be poor coumadin  . Stroke   . Other and unspecified hyperlipidemia   . Hypertension   . Hypertrophy of prostate without urinary obstruction and other lower urinary tract symptoms (LUTS)   . Osteoarthrosis, unspecified whether generalized or localized, unspecified site   . Type II or unspecified type diabetes mellitus without mention of complication, not stated as uncontrolled   . AV block, Mobitz 1     Intolerance to ACE's / discontiuation of beta blockers  . Obstructive sleep apnea   . Iron deficiency anemia, unspecified     s/p EGD and coloscopy 3/10.gastritis.hemorrhids  . Cerebrovascular accident   . Renal insufficiency   . Obesity   . Allergic rhinitis, cause unspecified 05/29/2010  . CAD (coronary artery disease)     cath 12/04: mLAD 50-60%, mCFX 20-30%, pRCA 50-60%, mRCA 40%  . Normochromic normocytic anemia 04/21/2013  . Abnormal CXR 04/21/2013  . Hypertensive kidney disease with CKD stage III 04/21/2013  . Prostate cancer   . CHF (congestive heart failure)   . COPD (chronic obstructive pulmonary disease)   . Shortness of breath dyspnea   . Pneumonia     Past Surgical History  Procedure Laterality Date  . Prostate surgery    . Prostate surgury      hx of  . Esophagogastroduodenoscopy N/A 04/21/2013    Procedure: ESOPHAGOGASTRODUODENOSCOPY (EGD);  Surgeon:  Irene Shipper, MD;  Location: [facility];  Service: Endoscopy;  Laterality: N/A;    Family History  Problem Relation Age of Onset  . Heart disease Father   . Dementia Mother   . CAD Sister     Social History:  reports that he has quit smoking. He has never used smokeless tobacco. He reports that he does not drink alcohol or use illicit drugs.  Allergies:  Allergies  Allergen Reactions  . Ace Inhibitors Other (See Comments)    HYPOTENSION  . Beta Adrenergic Blockers Other (See Comments)     use cautiously secondary to 2nd degree heart block, hypotension    Medications: I have reviewed the patient's current medications.  Results for orders placed or performed during the hospital encounter of 09/06/14 (from the past 48 hour(s))  Glucose, capillary     Status: None   Collection Time: 09/11/14  7:44 AM  Result Value Ref Range   Glucose-Capillary 90 65 - 99 mg/dL  Vancomycin, trough     Status: None   Collection Time: 09/11/14  8:50 AM  Result Value Ref Range   Vancomycin Tr 14 10.0 - 20.0 ug/mL  Glucose, capillary     Status: Abnormal   Collection Time: 09/11/14 12:43 PM  Result Value Ref Range   Glucose-Capillary 141 (H) 65 - 99 mg/dL  Glucose, capillary     Status: Abnormal   Collection Time: 09/11/14  4:21 PM  Result Value Ref Range   Glucose-Capillary 187 (H) 65 - 99 mg/dL  Glucose, capillary  Status: Abnormal   Collection Time: 09/11/14  9:50 PM  Result Value Ref Range   Glucose-Capillary 185 (H) 65 - 99 mg/dL  Glucose, capillary     Status: Abnormal   Collection Time: 09/12/14  7:46 AM  Result Value Ref Range   Glucose-Capillary 142 (H) 65 - 99 mg/dL  CBC with Differential/Platelet     Status: Abnormal   Collection Time: 09/12/14 10:55 AM  Result Value Ref Range   WBC 16.5 (H) 4.0 - 10.5 K/uL   RBC 3.60 (L) 4.22 - 5.81 MIL/uL   Hemoglobin 8.9 (L) 13.0 - 17.0 g/dL   HCT 29.7 (L) 39.0 - 52.0 %   MCV 82.5 78.0 - 100.0 fL   MCH 24.7 (L) 26.0 - 34.0 pg   MCHC  30.0 30.0 - 36.0 g/dL   RDW 19.7 (H) 11.5 - 15.5 %   Platelets 412 (H) 150 - 400 K/uL   Neutrophils Relative % 86 (H) 43 - 77 %   Neutro Abs 14.2 (H) 1.7 - 7.7 K/uL   Lymphocytes Relative 7 (L) 12 - 46 %   Lymphs Abs 1.2 0.7 - 4.0 K/uL   Monocytes Relative 4 3 - 12 %   Monocytes Absolute 0.6 0.1 - 1.0 K/uL   Eosinophils Relative 3 0 - 5 %   Eosinophils Absolute 0.5 0.0 - 0.7 K/uL   Basophils Relative 0 0 - 1 %   Basophils Absolute 0.0 0.0 - 0.1 K/uL  Basic metabolic panel     Status: Abnormal   Collection Time: 09/12/14 10:55 AM  Result Value Ref Range   Sodium 139 135 - 145 mmol/L   Potassium 3.5 3.5 - 5.1 mmol/L   Chloride 106 101 - 111 mmol/L   CO2 25 22 - 32 mmol/L   Glucose, Bld 155 (H) 65 - 99 mg/dL   BUN 53 (H) 6 - 20 mg/dL   Creatinine, Ser 2.96 (H) 0.61 - 1.24 mg/dL   Calcium 8.2 (L) 8.9 - 10.3 mg/dL   GFR calc non Af Amer 19 (L) >60 mL/min   GFR calc Af Amer 22 (L) >60 mL/min    Comment: (NOTE) The eGFR has been calculated using the CKD EPI equation. This calculation has not been validated in all clinical situations. eGFR's persistently <60 mL/min signify possible Chronic Kidney Disease.    Anion gap 8 5 - 15  Glucose, capillary     Status: Abnormal   Collection Time: 09/12/14 12:07 PM  Result Value Ref Range   Glucose-Capillary 153 (H) 65 - 99 mg/dL  Glucose, capillary     Status: Abnormal   Collection Time: 09/12/14  4:37 PM  Result Value Ref Range   Glucose-Capillary 155 (H) 65 - 99 mg/dL  Glucose, capillary     Status: Abnormal   Collection Time: 09/12/14  8:36 PM  Result Value Ref Range   Glucose-Capillary 145 (H) 65 - 99 mg/dL   Comment 1 Notify RN   Glucose, capillary     Status: Abnormal   Collection Time: 09/12/14  9:25 PM  Result Value Ref Range   Glucose-Capillary 132 (H) 65 - 99 mg/dL    No results found.  Review of Systems  All other systems reviewed and are negative.  Blood pressure 152/59, pulse 85, temperature 99.6 F (37.6 C),  temperature source Oral, resp. rate 19, height 5' 10.5" (1.791 m), weight 109.907 kg (242 lb 4.8 oz), SpO2 93 %. Physical Exam On examination patient has thin atrophic ischemic changes to both  lower extremities from the knee distal. Examination the right foot he has foul smelling necrotic draining ulcers around the right foot with  Brown purulent drainage. Patient does not have palpable pulses in either foot. Patient also has ischemic ulcers in the left leg as well. Assessment/Plan: Assessment multiple medical problems with end-stage renal disease COPD diabetes and necrotic foul-smelling abscess right foot with ischemic ulcerations to the left leg. Patient is a high risk.  Plan: We'll plan for a right transtibial amputation this evening patient may require an above-the-knee amputation on the right. Patient may also require amputation to the left lower extremity.  Would recommend vascular consultation to see if any revascularization options are available for the left lower extremity.  Kaelei Wheeler V 09/13/2014, 7:37 AM

## 2014-09-13 NOTE — Progress Notes (Addendum)
Patient ID: [name], male   DOB: 1932/04/27, 81 y.o.   MRN: [id number]  [facility] PROGRESS NOTE  [name] VVZ:[id number] DOB: [birth year] DOA: 09/06/2014 PCP: [name], MD   Brief narrative:    81 y.o. male who was admitted for open diabetic foot ulcers. Ortho was consulted and there is no plan for debridement on 7/18. But over the course of the next 3 days, his leg wounds have worsened and we have requested orthopedics to evaluate for debridement. Dr [name]   Assessment/Plan:    Wound, open, foot - Patient has chronic bilateral lower extremity sores, likely due to diabetes mellitus - Wound care has been consulted and has seen the patient at bedside.  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 He will follow the patient as outpatient.  Hypothyroid - on synthroid 125 mcg  History of prostate cancer-15 yrs ago - stable   Anemia of chronic disease, CKD - no sings of active bleeding   Essential hypertension - on hydralazine 100 mg tablet, clonidine patch 0.1 mg - reasonable inpatient control   Dyslipidemia - continue statin   CAD, moderate - pt denies any chest pain.   Acute on CKD stage 4:  - improving, with diuresis.  - on torsemide.   Sepsis - pt met criteria for sepsis with WBC 16 K, fever 99.9 F, source possible HCAP - on vanc and zosyn for now   Obesity  - Body mass index is 32.1 kg/(m^2).  DVT prophylaxis - Heparin SQ  Code Status: DNR Family Communication:  plan of  care discussed with the patient Disposition Plan: Not ready for d/c  IV access:  Peripheral IV  Procedures and diagnostic studies:    Dg Chest 1 View 09/07/2014  Enlargement of cardiac silhouette with diffuse BILATERAL infiltrates favoring pulmonary edema over infection.  Sclerotic foci at the proximal LEFT humerus  Dg Chest 2 View 09/09/2014  Improving pulmonary edema. 2. Residual patchy airspace opacity in the right mid lung in the region of the minor fissure may represent superimposed infiltrate/pneumonia, or perhaps some fluid trapped within the minor fissure.   Nm Pulmonary Perf And Vent 09/08/2014  Severe bilateral ventilatory defects consistent with bilateral airspace disease noted on recent chest x-ray. No perfusion defects noted to suggest pulmonary embolus. Low probability scan for pulmonary embolus.    Dg Chest Portable 1 View 09/06/2014   Areas of lung scarring. No frank edema or consolidation. Several bones appear somewhat sclerotic. This finding may warrant prostate evaluation given the propensity of prostate carcinoma to present with sclerotic bony metastases.     Dg Foot Complete Right 09/06/2014  No acute bony abnormality.     Medical Consultants:  Ortho Pharmacy   Other Consultants:  None  IAnti-Infectives:   Zosyn 3.375 g, IV, at 12.5 mL/hr, every 8 hours Vancomycin every 48 hours renally dosed.  Faye Ramsay, MD  [facility] Pager [phone]  If 7PM-7AM, please contact night-coverage [email or web address] Password [facility] 09/13/2014, 6:31 PM   LOS: 7 days  HPI/Subjective: No events overnight.   Objective: Filed Vitals:   09/12/14 1959 09/12/14 2125 09/13/14 0503 09/13/14 1000  BP: 148/60 144/59 152/59 143/62  Pulse: 81 80 85 81  Temp: 99.9 F (37.7 C) 99.3 F (37.4 C) 99.6 F (37.6 C) 98.2 F (36.8 C)  TempSrc: Oral Oral Oral Oral  Resp: 20 18 19 18   Height:  5' 10.5" (1.791 m)    Weight:  109.907 kg (242 lb 4.8 oz)    SpO2: 94% 95% 93% 95%     Intake/Output Summary (Last 24 hours) at 09/13/14 1831 Last data filed at 09/13/14 1820  Gross per 24 hour  Intake 536.67 ml  Output   1050 ml  Net -513.33 ml    Exam:   General:  Pt is alert, follows commands appropriately, not in acute distress  Cardiovascular: Regular rate and rhythm,  no rubs, no gallops  Respiratory: Clear to auscultation bilaterally, diminished breath sounds at bases with scattered rhonchi   Abdomen: Soft, non tender, non distended, bowel sounds present, no guarding  Extremities: dressing in place   Data Reviewed: Basic Metabolic Panel:  Recent Labs Lab 09/08/14 0511 09/09/14 0415 09/10/14 0353 09/12/14 1055 09/13/14 1720  NA 142 143 145 139 143  K 4.6 3.5 3.6 3.5 3.8  CL 110 109 110 106 111  CO2 21* 23 26 25 23   GLUCOSE 144* 43* 38* 155* 130*  BUN 73* 65* 56* 53* 51*  CREATININE 2.96* 2.74* 2.47* 2.96* 3.21*  CALCIUM 8.7* 8.8* 8.4* 8.2* 8.4*   Liver Function Tests:  Recent Labs Lab 09/07/14 0437 09/08/14 0511 09/13/14 1720  AST 19 25 24   ALT 23 22 22   ALKPHOS 150* 160* 120  BILITOT 0.2* 0.5 0.6  PROT 7.0 7.1 6.5  ALBUMIN 2.4* 2.4* 1.8*   CBC:  Recent Labs Lab 09/08/14 0511 09/09/14 0415 09/10/14 0353 09/12/14 1055 09/13/14 1720  WBC 19.6* 17.5* 15.1* 16.5* 16.4*  NEUTROABS  --   --   --  14.2* 14.1*  HGB 9.7* 9.5* 9.1* 8.9* 9.0*  HCT 30.9* 30.1* 29.5* 29.7* 29.7*  MCV 80.7 79.2 79.9 82.5 80.7  PLT 457* 446* 497* 412* 415*   CBG:  Recent Labs Lab 09/12/14 2036 09/12/14 2125 09/13/14 0750 09/13/14 1159 09/13/14 1650  GLUCAP 145* 132* 120* 146* 144*    Recent Results (from the past 240 hour(s))  MRSA PCR Screening     Status: None   Collection Time: 09/06/14 10:07 PM  Result Value Ref Range Status   MRSA by PCR NEGATIVE NEGATIVE Final    Comment:        The GeneXpert MRSA Assay (FDA approved for NASAL specimens only), is one component of a comprehensive MRSA colonization surveillance program. It is  not intended to diagnose MRSA infection nor to guide or monitor treatment for MRSA infections.      Scheduled Meds: . [MAR Hold] allopurinol  100 mg Oral QHS  . [MAR Hold] amiodarone  100 mg Oral Daily  . [MAR Hold] amLODipine  10 mg Oral Daily  . [MAR Hold] aspirin EC  81 mg Oral Daily  . [MAR Hold] atorvastatin  40 mg Oral q1800  . ceFAZolin      . [START ON 09/14/2014]  ceFAZolin (ANCEF) IV  2 g Intravenous On Call to OR  . [MAR Hold] citalopram  20 mg Oral Daily  . [MAR Hold] heparin  5,000 Units Subcutaneous 3 times per day  . [MAR Hold] hydrALAZINE  100 mg Oral TID  . [  MAR Hold] insulin aspart  0-9 Units Subcutaneous TID WC  . [MAR Hold] insulin detemir  30 Units Subcutaneous Daily  . [MAR Hold] isosorbide mononitrate  60 mg Oral Daily  . [MAR Hold] levothyroxine  125 mcg Oral QAC breakfast  . [MAR Hold] pantoprazole  40 mg Oral Daily  . [MAR Hold] piperacillin-tazobactam (ZOSYN)  IV  3.375 g Intravenous Q8H  . [MAR Hold] silver sulfADIAZINE   Topical Daily  . [MAR Hold] sodium chloride  3 mL Intravenous Q12H  . [MAR Hold] torsemide  40 mg Oral BID  . [MAR Hold] vancomycin  1,500 mg Intravenous Q48H   Continuous Infusions: . sodium chloride 10 mL/hr at 09/13/14 0111

## 2014-09-13 NOTE — Progress Notes (Signed)
PT Cancellation Note  Patient Details Name: [name] MRN: [id number] DOB: Jul 18, 1932   Cancelled Treatment:    Reason Eval/Treat Not Completed: Other (comment)   Noted plans for OR later today;  Will follow-up for PT Re-eval postop;   Thanks,  [name], PT  Acute Rehabilitation Services Pager [phone] Office [phone]    [name] [facility] 09/13/2014, 12:34 PM

## 2014-09-13 NOTE — Anesthesia Postprocedure Evaluation (Signed)
  Anesthesia Post-op Note  Patient: [name]  Procedure(s) Performed: Procedure(s): AMPUTATION ABOVE KNEE (Right)  Patient Location: PACU  Anesthesia Type:General  Level of Consciousness: awake and alert   Airway and Oxygen Therapy: Patient Spontanous Breathing  Post-op Pain: Controlled  Post-op Assessment: Post-op Vital signs reviewed, Patient's Cardiovascular Status Stable and Respiratory Function Stable  Post-op Vital Signs: Reviewed  Filed Vitals:   09/13/14 1930  BP:   Pulse:   Temp: 36.9 C  Resp:     Complications: No apparent anesthesia complications

## 2014-09-13 NOTE — Transfer of Care (Signed)
Immediate Anesthesia Transfer of Care Note  Patient: [name]  Procedure(s) Performed: Procedure(s): AMPUTATION BELOW KNEE (Right)  Patient Location: PACU  Anesthesia Type:General  Level of Consciousness: awake and alert   Airway & Oxygen Therapy: Patient Spontanous Breathing and Patient connected to nasal cannula oxygen  Post-op Assessment: Report given to RN and Post -op Vital signs reviewed and stable  Post vital signs: Reviewed and stable  Last Vitals:  Filed Vitals:   09/13/14 1000  BP: 143/62  Pulse: 81  Temp: 36.8 C  Resp: 18    Complications: No apparent anesthesia complications

## 2014-09-13 NOTE — Anesthesia Preprocedure Evaluation (Addendum)
Anesthesia Evaluation  Patient identified by MRN, date of birth, ID band Patient awake    Reviewed: Allergy & Precautions, NPO status , Patient's Chart, lab work & pertinent test results  History of Anesthesia Complications Negative for: history of anesthetic complications  Airway Mallampati: II  TM Distance: >3 FB Neck ROM: Full    Dental  (+) Edentulous Upper, Edentulous Lower   Pulmonary pneumonia - (recent pneumonia), COPD (nocturnal oxygen) oxygen dependent, former smoker,  breath sounds clear to auscultation        Cardiovascular hypertension, Pt. on medications - angina (mild-mod ASCAD)+ CAD, + Peripheral Vascular Disease and +CHF + dysrhythmias Rhythm:Irregular Rate:Normal  1/16 ECHO: Mod LVH, EF 55-60%, mild MR   Neuro/Psych Anxiety CVA (L weakness, does not walk), Residual Symptoms    GI/Hepatic Neg liver ROS, GERD-  Medicated and Controlled,  Endo/Other  diabetes (glu 144), Insulin DependentHypothyroidism Morbid obesity  Renal/GU Renal InsufficiencyRenal disease (creat 2.96)   Prostate cancer    Musculoskeletal   Abdominal   Peds  Hematology  (+) Blood dyscrasia (Hb 8.9), ,   Anesthesia Other Findings   Reproductive/Obstetrics                          Anesthesia Physical Anesthesia Plan  ASA: III  Anesthesia Plan: General   Post-op Pain Management:    Induction: Intravenous  Airway Management Planned: Oral ETT  Additional Equipment:   Intra-op Plan:   Post-operative Plan: Extubation in OR  Informed Consent: I have reviewed the patients History and Physical, chart, labs and discussed the procedure including the risks, benefits and alternatives for the proposed anesthesia with the patient or authorized representative who has indicated his/her understanding and acceptance.     Plan Discussed with: CRNA and Surgeon  Anesthesia Plan Comments: (Plan routine monitors,  GETA)        Anesthesia Quick Evaluation

## 2014-09-13 NOTE — Care Management Important Message (Signed)
Important Message  Patient Details  Name: [name] MRN: [id number] Date of Birth: October 11, 1932   Medicare Important Message Given:  Yes-third notification given    RoyalRory Percy, RN 09/13/2014, 10:38 AM

## 2014-09-13 NOTE — Telephone Encounter (Signed)
Patient's daughter, [name] called in stating that patient is in hospital now and surgeon is wanting to operate today---treva was concerned because she felt like he wasn't doing that bad according to last 2 office visits patient had here---i asked advice from dr hopper and dr hopper suggested that patients family needs to have consultation with surgeon proposing sx and have surgeon explain all test results, etc that have led to the surgeon wanting to make the decision to operate and also family needs to ask for 2nd opinion---treva was advised

## 2014-09-14 ENCOUNTER — Encounter (HOSPITAL_COMMUNITY): Payer: Self-pay | Admitting: Orthopedic Surgery

## 2014-09-14 LAB — BASIC METABOLIC PANEL
Anion gap: 14 (ref 5–15)
BUN: 55 mg/dL — ABNORMAL HIGH (ref 6–20)
CHLORIDE: 103 mmol/L (ref 101–111)
CO2: 19 mmol/L — AB (ref 22–32)
Calcium: 8.1 mg/dL — ABNORMAL LOW (ref 8.9–10.3)
Creatinine, Ser: 3.66 mg/dL — ABNORMAL HIGH (ref 0.61–1.24)
GFR calc non Af Amer: 14 mL/min — ABNORMAL LOW (ref >60)
GFR, EST AFRICAN AMERICAN: 17 mL/min — AB (ref >60)
GLUCOSE: 183 mg/dL — AB (ref 65–99)
Potassium: 3.8 mmol/L (ref 3.5–5.1)
Sodium: 136 mmol/L (ref 135–145)

## 2014-09-14 LAB — CBC
HEMATOCRIT: 30.5 % — AB (ref 39.0–52.0)
Hemoglobin: 9.3 g/dL — ABNORMAL LOW (ref 13.0–17.0)
MCH: 25.2 pg — ABNORMAL LOW (ref 26.0–34.0)
MCHC: 30.5 g/dL (ref 30.0–36.0)
MCV: 82.7 fL (ref 78.0–100.0)
Platelets: 441 10*3/uL — ABNORMAL HIGH (ref 150–400)
RBC: 3.69 MIL/uL — ABNORMAL LOW (ref 4.22–5.81)
RDW: 20 % — ABNORMAL HIGH (ref 11.5–15.5)
WBC: 19.6 10*3/uL — ABNORMAL HIGH (ref 4.0–10.5)

## 2014-09-14 LAB — GLUCOSE, CAPILLARY
GLUCOSE-CAPILLARY: 184 mg/dL — AB (ref 65–99)
GLUCOSE-CAPILLARY: 174 mg/dL — AB (ref 65–99)
GLUCOSE-CAPILLARY: 161 mg/dL — AB (ref 65–99)

## 2014-09-14 MED ORDER — GLUCERNA SHAKE PO LIQD
237.0000 mL | Freq: Two times a day (BID) | ORAL | Status: DC
  Administered 2014-09-14 – 2014-09-21 (×13): 237 mL via ORAL

## 2014-09-14 NOTE — Progress Notes (Addendum)
ANTIBIOTIC CONSULT NOTE - FOLLOW UP  Pharmacy Consult for Vancomycin, Zosyn Indication: foot wound, HCAP  Allergies  Allergen Reactions  . Ace Inhibitors Other (See Comments)    HYPOTENSION  . Beta Adrenergic Blockers Other (See Comments)     use cautiously secondary to 2nd degree heart block, hypotension    Patient Measurements: Height: 5' 10.5" (179.1 cm) Weight: 227 lb (102.967 kg) IBW/kg (Calculated) : 74.15  Vital Signs: Temp: 98.4 F (36.9 C) (07/26 0931) Temp Source: Oral (07/26 0931) BP: 117/50 mmHg (07/26 0931) Pulse Rate: 78 (07/26 0931) Intake/Output from previous day: 07/25 0701 - 07/26 0700 In: 2528.8 [P.O.:680; I.V.:298.8; IV Piggyback:1550] Out: 1300 [Urine:1250; Blood:50] Intake/Output from this shift: Total I/O In: 120 [P.O.:120] Out: -   Labs:  Recent Labs  09/12/14 1055 09/13/14 1720 09/14/14 0520  WBC 16.5* 16.4* 19.6*  HGB 8.9* 9.0* 9.3*  PLT 412* 415* 441*  CREATININE 2.96* 3.21* 3.66*   Estimated Creatinine Clearance: 19.5 mL/min (by C-G formula based on Cr of 3.66). No results for input(s): VANCOTROUGH, VANCOPEAK, VANCORANDOM, GENTTROUGH, GENTPEAK, GENTRANDOM, TOBRATROUGH, TOBRAPEAK, TOBRARND, AMIKACINPEAK, AMIKACINTROU, AMIKACIN in the last 72 hours.   Microbiology: Recent Results (from the past 720 hour(s))  MRSA PCR Screening     Status: None   Collection Time: 09/06/14 10:07 PM  Result Value Ref Range Status   MRSA by PCR NEGATIVE NEGATIVE Final    Comment:        The GeneXpert MRSA Assay (FDA approved for NASAL specimens only), is one component of a comprehensive MRSA colonization surveillance program. It is not intended to diagnose MRSA infection nor to guide or monitor treatment for MRSA infections.     Anti-infectives    Start     Dose/Rate Route Frequency Ordered Stop   09/14/14 0600  ceFAZolin (ANCEF) IVPB 2 g/50 mL premix  Status:  Discontinued     2 g 100 mL/hr over 30 Minutes Intravenous On call to O.R.  09/13/14 1710 09/13/14 1946   09/13/14 1711  ceFAZolin (ANCEF) 2-3 GM-% IVPB SOLR    Comments:  Ara Kussmaul   : cabinet override      09/13/14 1711 09/14/14 0529   09/09/14 1000  vancomycin (VANCOCIN) 1,500 mg in sodium chloride 0.9 % 500 mL IVPB     1,500 mg 250 mL/hr over 120 Minutes Intravenous Every 48 hours 09/07/14 0849     09/07/14 1600  piperacillin-tazobactam (ZOSYN) IVPB 3.375 g     3.375 g 12.5 mL/hr over 240 Minutes Intravenous Every 8 hours 09/07/14 0849     09/07/14 0900  vancomycin (VANCOCIN) 2,250 mg in sodium chloride 0.9 % 500 mL IVPB     2,250 mg 250 mL/hr over 120 Minutes Intravenous  Once 09/07/14 0820 09/07/14 1141   09/07/14 0900  piperacillin-tazobactam (ZOSYN) IVPB 3.375 g     3.375 g 100 mL/hr over 30 Minutes Intravenous  Once 09/07/14 0849 09/07/14 1011      Assessment: 81 y.o. male with PMH DM, CKD, CAD admitted 09/06/2014 for bilateral painful LE sores, most likely from chronic DMT2. Worsening chronic edema and difficulty ambulating d/t pain. Seen by wound care; no osteo on X-ray, but today with new leukocytosis. MD covering for DM foot wound with vancomycin and Zosyn.  Now with possible PNA vs pulmonary edema; extending coverage for HCAP.  S/p R AKA on 7/25.   7/19 >> Vanc >> 7/19 >> Zosyn >>     WBC: elevated (19.6) Renal: SCr 3.66 baseline(2 - 2.5); CrCl 20  CG, 24 N 7/19 CXR shows bilat infiltr (edema > PNA) 7/20 VQ scan w/ severe bilat vent defects 7/21 CXR shows pulm edema with possible superimposed infiltrate/PNA  No cultures MRSA screen neg   7/23: 0900 VT = 14 before 2nd dose of 1500mg  q48h regimen.   Goal of Therapy:  Vancomycin trough level 15-20 mcg/ml  Eradication of infection Appropriate antibiotic dosing for indication and renal function  Plan:  -Continue Zosyn 3.375 g IV given every 8 hrs by 4-hr infusion and Vanc 1500mg  IV Q48h per MD note  -Monitor renal function (CrCl borderline) as may need to decrease Zosyn -Mon  clinical progress / WBC / Tmax  -F/U post-op abx LOT / abx de-escalation plans (7/27)   Javontay Vandam C. Lennox Grumbles, PharmD Pharmacy Resident  Pager: [phone] 09/14/2014 2:23 PM

## 2014-09-14 NOTE — Progress Notes (Addendum)
Patient ID: [name], male   DOB: 1932-12-24, 81 y.o.   MRN: [id number]  [facility] PROGRESS NOTE  [name] [id number]:[id number] DOB: [birth year] DOA: 09/06/2014 PCP: [name], MD   Brief narrative:    81 y.o. male who was admitted for open diabetic foot ulcers. Ortho was consulted and there is no plan for debridement on 7/18. But over the course of the next 3 days, his leg wounds have worsened and we have requested orthopedics to evaluate for debridement. Dr [name] Given [facility]   Major events since admission:  7/24 - transferred from [facility] to [facility] 7/25 - taken to OR for above the knee amputation, right - Dr. 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 He will follow the patient as outpatient.  Hypothyroid - on synthroid 125 mcg  History of prostate cancer-15 yrs ago - stable   Anemia of chronic disease, CKD - no sings of active bleeding   Essential hypertension - on hydralazine 100 mg tablet, clonidine patch 0.1 mg - reasonable inpatient control   Dyslipidemia - continue statin   CAD,  moderate - pt denies any chest pain.   Acute on CKD stage 4:  - on torsemide - Cr up this AM, likely post op related  - repeat BMP in AM  Sepsis - pt met criteria for sepsis with WBC 16 K, fever 99.9 F, source possible HCAP - on vanc and zosyn for now and will continue as pt still febrile post op with T max 101.3 F  Obesity  - Body mass index is 32.1 kg/(m^2).  DVT prophylaxis - Heparin SQ  Code Status: DNR Family Communication:  plan of care discussed with the patient Disposition Plan: Not ready for d/c but if no fevers may be able to d/c in 1-2 days if Dr. [name] Given OK, will need SNF  IV access:  Peripheral IV  Procedures and diagnostic studies:    Dg Chest 1 View 09/07/2014  Enlargement of cardiac silhouette with diffuse BILATERAL infiltrates favoring pulmonary edema over infection.  Sclerotic foci at the proximal LEFT humerus  Dg Chest 2 View 09/09/2014  Improving pulmonary edema. 2. Residual patchy airspace opacity in the right mid lung in the region of the minor fissure may represent superimposed infiltrate/pneumonia, or perhaps some fluid trapped within the minor fissure.   Nm Pulmonary Perf And Vent 09/08/2014  Severe bilateral ventilatory defects consistent with bilateral airspace disease noted on recent chest x-ray. No perfusion defects noted to suggest pulmonary embolus. Low  probability scan for pulmonary embolus.    Dg Chest Portable 1 View 09/06/2014   Areas of lung scarring. No frank edema or consolidation. Several bones appear somewhat sclerotic. This finding may warrant prostate evaluation given the propensity of prostate carcinoma to present with sclerotic bony metastases.     Dg Foot Complete Right 09/06/2014  No acute bony abnormality.     Medical Consultants:  Ortho Pharmacy   Other Consultants:  None  IAnti-Infectives:   Zosyn 3.375 g, IV, at 12.5 mL/hr, every 8 hours Vancomycin every 48 hours renally dosed.  Faye Ramsay, MD  [facility] Pager  [phone]  If 7PM-7AM, please contact night-coverage [email or web address] Password [facility] 09/14/2014, 4:37 PM   LOS: 8 days   HPI/Subjective: No events overnight.   Objective: Filed Vitals:   09/13/14 1930 09/13/14 1959 09/14/14 0513 09/14/14 0931  BP:  120/60 132/37 117/50  Pulse:  76 75 78  Temp: 98.4 F (36.9 C) 98.7 F (37.1 C) 98.6 F (37 C) 98.4 F (36.9 C)  TempSrc:  Oral Oral Oral  Resp:  _0 Height:      Weight:  102.967 kg (227 lb)    SpO2:  92% 90% 96%    Intake/Output Summary (Last 24 hours) at 09/14/14 1637 Last data filed at 09/14/14 1500  Gross per 24 hour  Intake 2938.83 ml  Output    700 ml  Net 2238.83 ml    Exam:   General:  Pt is alert, follows commands appropriately, not in acute distress  Cardiovascular: Regular rate and rhythm,  no rubs, no gallops  Respiratory: Clear to auscultation bilaterally, diminished breath sounds at bases with scattered rhonchi   Abdomen: Soft, non tender, non distended, bowel sounds present, no guarding  Extremities: dressing in place   Data Reviewed: Basic Metabolic Panel:  Recent Labs Lab 09/09/14 0415 09/10/14 0353 09/12/14 1055 09/13/14 1720 09/14/14 0520  NA 143 145 139 143 136  K 3.5 3.6 3.5 3.8 3.8  CL 109 110 106 111 103  CO2 _1 19*  GLUCOSE 43* 38* 155* 130* 183*  BUN 65* 56* 53* 51* 55*  CREATININE 2.74* 2.47* 2.96* 3.21* 3.66*  CALCIUM 8.8* 8.4* 8.2* 8.4* 8.1*   Liver Function Tests:  Recent Labs Lab 09/08/14 0511 09/13/14 1720  AST 25 24  ALT 22 22  ALKPHOS 160* 120  BILITOT 0.5 0.6  PROT 7.1 6.5  ALBUMIN 2.4* 1.8*   CBC:  Recent Labs Lab 09/09/14 0415 09/10/14 0353 09/12/14 1055 09/13/14 1720 09/14/14 0520  WBC 17.5* 15.1* 16.5* 16.4* 19.6*  NEUTROABS  --   --  14.2* 14.1*  --   HGB 9.5* 9.1* 8.9* 9.0* 9.3*  HCT 30.1* 29.5* 29.7* 29.7* 30.5*  MCV 79.2 79.9 82.5 80.7 82.7  PLT 446* 497* 412* 415* 441*   CBG:  Recent Labs Lab 09/13/14 1650  09/13/14 1852 09/13/14 2058 09/14/14 0738 09/14/14 1138  GLUCAP 144* 107* 155* 184* 174*    Recent Results (from the past 240 hour(s))  MRSA PCR Screening     Status: None   Collection Time: 09/06/14 10:07 PM  Result Value Ref Range Status   MRSA by PCR NEGATIVE NEGATIVE Final    Comment:        The GeneXpert MRSA Assay (FDA approved for NASAL specimens only), is one component of a comprehensive MRSA colonization surveillance program. It is not intended to diagnose MRSA infection nor to guide or monitor treatment for MRSA infections.  Scheduled Meds: . allopurinol  100 mg Oral QHS  . amiodarone  100 mg Oral Daily  . amLODipine  10 mg Oral Daily  . aspirin EC  81 mg Oral Daily  . atorvastatin  40 mg Oral q1800  . citalopram  20 mg Oral Daily  . feeding supplement (GLUCERNA SHAKE)  237 mL Oral BID BM  . heparin  5,000 Units Subcutaneous 3 times per day  . hydrALAZINE  100 mg Oral TID  . insulin aspart  0-9 Units Subcutaneous TID WC  . insulin detemir  30 Units Subcutaneous Daily  . isosorbide mononitrate  60 mg Oral Daily  . levothyroxine  125 mcg Oral QAC breakfast  . pantoprazole  40 mg Oral Daily  . piperacillin-tazobactam (ZOSYN)  IV  3.375 g Intravenous Q8H  . silver sulfADIAZINE   Topical Daily  . sodium chloride  3 mL Intravenous Q12H  . torsemide  40 mg Oral BID  . vancomycin  1,500 mg Intravenous Q48H   Continuous Infusions: . sodium chloride 10 mL/hr at 09/13/14 0111  . sodium chloride 10 mL/hr at 09/13/14 2007

## 2014-09-14 NOTE — Clinical Social Work Note (Signed)
Updated clinicals (post amputation) transmitted to [facility] to be forwarded to [facility] case Freight [email or web address].Robin. Call made to [facility], admissions director at [facility] and message left. CSW will continue to follow and assist with d/c to [facility] once medically stable.   [name], MSW, LCSW Licensed Clinical Social Worker [place] [phone]

## 2014-09-14 NOTE — Progress Notes (Signed)
Patient ID: [name], male   DOB: [birth year], 81 y.o.   MRN: [id number] Postoperative day 1 right above-the-knee amputation. Dressing has bled through plan for dressing change today. Patient has not received his an Unna compressive wrap for the left lower extremity. Anticipate discharge to skilled nursing.

## 2014-09-14 NOTE — Evaluation (Addendum)
Physical Therapy Re-Evaluation Patient Details Name: [name] MRN: [id number] DOB: 02-08-1933 Today's Date: 09/14/2014   History of Present Illness  81 yo male brought to ED 09/06/14 with worseneing wounds of both legs, inability to care for self. H/O CHF, DM, CVA, myelodysplasticsyndrome, chronic ulcers of bith legs. Now s/p R LE AKA  Clinical Impression  Patient is s/p above surgery resulting in functional limitations due to the deficits listed below (see PT Problem List).  Patient will benefit from skilled PT to increase their independence and safety with mobility to allow discharge to the venue listed below.       Follow Up Recommendations SNF;Supervision/Assistance - 24 hour    Equipment Recommendations  None recommended by PT    Recommendations for Other Services OT consult     Precautions / Restrictions Precautions Precautions: Fall      Mobility  Bed Mobility Overal bed mobility: Needs Assistance;+2 for physical assistance;+ 2 for safety/equipment Bed Mobility: Supine to Sit;Sit to Supine     Supine to sit: Max assist;+2 for physical assistance Sit to supine: Max assist;+2 for physical assistance   General bed mobility comments: Assist to position hip sat EOB and elevate trunk to upright, initial posterior lean upon sitting  Transfers                    Ambulation/Gait                Stairs            Wheelchair Mobility    Modified Rankin (Stroke Patients Only)       Balance   Sitting-balance support: Bilateral upper extremity supported Sitting balance-Leahy Scale: Fair Sitting balance - Comments: Sat EOB initially needing assist to keep balance, but progressing to minguard assist; Pt with minimal weight shift outside of base of support, but able to hug daughter with support of UEs                                     Pertinent Vitals/Pain Pain Assessment: Faces Faces Pain Scale: Hurts whole lot Pain  Location: R residual limb with moving Pain Descriptors / Indicators: Grimacing Pain Intervention(s): Limited activity within patient's tolerance;Repositioned    Home Living Family/patient expects to be discharged to:: Skilled nursing facility Living Arrangements: Alone Available Help at Discharge: Family;Available PRN/intermittently;Personal care attendant Type of Home: Apartment Home Access: Ramped entrance     Home Layout: One level Home Equipment: Wheelchair - power;Walker - 4 wheels;Bedside commode      Prior Function Level of Independence: Needs assistance   Gait / Transfers Assistance Needed: Independent with transfers w/c level. No gait per pt report x 1 year  ADL's / Homemaking Assistance Needed: Pt reports having a CNA come in the mornings to assist with ADLs. Has hired assistance for housekeeping.  Pt reports he was mod I with w/c <> toilet transfers and toileting  until recently.   Comments: Pt reports going to [facility] daily during the week (bus would pick him up and drop him off) for exercises, Bingo, socialization.  (Per OT note from 6 months ago)     Hand Dominance        Extremity/Trunk Assessment   Upper Extremity Assessment: Defer to OT evaluation           Lower Extremity Assessment: Generalized weakness RLE Deficits / Details: unable to flex foot, skin is  sloughing off from  medial/arch of the foot. entire lower leg is full of wounds, malodorous LLE Deficits / Details: New AKA; Pain with any motion LLE     Communication   Communication: Expressive difficulties (had recently recieved pain meds)  Cognition Arousal/Alertness: Lethargic;Suspect due to medications Behavior During Therapy: [facility] for tasks assessed/performed Overall Cognitive Status: Impaired/Different from baseline Area of Impairment: Attention;Following commands   Current Attention Level: Sustained   Following Commands: Follows one step commands inconsistently;Follows one step  commands with increased time       General Comments: Lethargic due to recent pain meds; Eyes opened mroe with the act of getting up    General Comments      Exercises        Assessment/Plan    PT Assessment Patient needs continued PT services  PT Diagnosis Generalized weakness;Acute pain   PT Problem List Decreased strength;Decreased activity tolerance;Decreased balance;Decreased mobility;Decreased safety awareness;Decreased knowledge of use of DME;Pain;Decreased skin integrity  PT Treatment Interventions DME instruction;Functional mobility training;Therapeutic activities;Patient/family education;Therapeutic exercise   PT Goals (Current goals can be found in the Care Plan section) Acute Rehab PT Goals Patient Stated Goal: not stated PT Goal Formulation: With patient Time For Goal Achievement: 09/20/14 Potential to Achieve Goals: Fair    Frequency Min 2X/week   Barriers to discharge Decreased caregiver support      Co-evaluation               End of Session Equipment Utilized During Treatment:  (bed pad to cradle hips) Activity Tolerance: Patient limited by pain Patient left: in bed;with call bell/phone within reach Nurse Communication: Mobility status         Time: 1448-1856 PT Time Calculation (min) (ACUTE ONLY): 25 min   Charges:   PT Evaluation $Initial PT Evaluation Tier I: 1 Procedure PT Treatments $Therapeutic Activity: 8-22 mins   PT G Codes:        Quin Hoop 09/14/2014, 1:11 PM  Roney Marion, [place] Pager [phone] Office [phone]

## 2014-09-14 NOTE — Progress Notes (Signed)
Nutrition Follow-up  DOCUMENTATION CODES:   Obesity unspecified  INTERVENTION:   Provide Glucerna Shake po BID, each supplement provides 220 kcal and 10 grams of protein.  Encourage adequate PO intake.   NUTRITION DIAGNOSIS:   Increased nutrient needs related to wound healing as evidenced by estimated needs; ongoing  GOAL:   Patient will meet greater than or equal to 90% of their needs; progressing  MONITOR:   PO intake, Supplement acceptance, Weight trends, Labs, I & O's  REASON FOR ASSESSMENT:   Malnutrition Screening Tool    ASSESSMENT:   81 yr old male whohas a past medical history of CHF; arrhythmia; stroke; hyperlipidemia; HTN; hypertrophy of prostate without urinary obstruction and other lower urinary tract symptoms (LUTS); osteoarthrosis; Type II or unspecified type diabetes mellitus without mention of complication; AV block, Mobitz 1; obstructive sleep apnea; iron deficiency anemia; CVA; renal insufficiency; obesity; allergic rhinitis; CAD; normochromic normocytic anemia; abnormal CXR (04/21/2013); 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 Worsening pain in left and right lower legs has been constantly aching for approximately 1 year and is relieved by rest and worsened by activity.   PROCEDURE (7/25): Right above-the-knee amputation  Meal completion has been 70-100%. Pt reports appetite is fine. Pt is agreeable to nutritional supplements to aid in wound healing. RD to order. Pt was encouraged to eat his food at meals and to drink his supplements.   Labs and medications reviewed.   Diet Order:  Diet Carb Modified Fluid consistency:: Thin; Room service appropriate?: Yes  Skin:   wound L Leg, R AKA, +1 generalized edema  Last BM:  7/20  Height:   Ht Readings from Last 1 Encounters:  09/12/14 5' 10.5" (1.791 m)     Weight:   Wt Readings from Last 1 Encounters:  09/13/14 227 lb (102.967 kg)    Ideal Body Weight:  76.82 kg (kg)  Wt Readings from Last 10 Encounters:  09/13/14 227 lb (102.967 kg)  06/22/14 244 lb (110.678 kg)  06/09/14 248 lb 12.8 oz (112.855 kg)  04/28/14 234 lb (106.142 kg)  04/07/14 235 lb (106.595 kg)  03/31/14 229 lb 12 oz (104.214 kg)  03/25/14 250 lb (113.399 kg)  03/13/14 235 lb 10.6 oz (106.897 kg)  02/09/14 230 lb (104.327 kg)  02/03/14 229 lb 9.6 oz (104.146 kg)    BMI:  Body mass index is 32.1 kg/(m^2).  Estimated Nutritional Needs:   Kcal:  2000-2200  Protein:  85-105 grams  Fluid:  2 L/day  EDUCATION NEEDS:   No education needs identified at this time  Corrin Parker, MS, RD, LDN Pager # [phone] After hours/ weekend pager # [phone]

## 2014-09-15 ENCOUNTER — Inpatient Hospital Stay (HOSPITAL_COMMUNITY): Payer: Medicare PPO

## 2014-09-15 LAB — BASIC METABOLIC PANEL
Anion gap: 11 (ref 5–15)
BUN: 64 mg/dL — ABNORMAL HIGH (ref 6–20)
CALCIUM: 8.2 mg/dL — AB (ref 8.9–10.3)
CO2: 23 mmol/L (ref 22–32)
Chloride: 104 mmol/L (ref 101–111)
Creatinine, Ser: 4.45 mg/dL — ABNORMAL HIGH (ref 0.61–1.24)
GFR, EST AFRICAN AMERICAN: 13 mL/min — AB (ref >60)
GFR, EST NON AFRICAN AMERICAN: 11 mL/min — AB (ref >60)
GLUCOSE: 181 mg/dL — AB (ref 65–99)
Potassium: 4 mmol/L (ref 3.5–5.1)
SODIUM: 138 mmol/L (ref 135–145)

## 2014-09-15 LAB — CBC
HCT: 26.6 % — ABNORMAL LOW (ref 39.0–52.0)
Hemoglobin: 8.2 g/dL — ABNORMAL LOW (ref 13.0–17.0)
MCH: 25 pg — ABNORMAL LOW (ref 26.0–34.0)
MCHC: 30.8 g/dL (ref 30.0–36.0)
MCV: 81.1 fL (ref 78.0–100.0)
PLATELETS: 435 10*3/uL — AB (ref 150–400)
RBC: 3.28 MIL/uL — ABNORMAL LOW (ref 4.22–5.81)
RDW: 19.9 % — ABNORMAL HIGH (ref 11.5–15.5)
WBC: 18.3 10*3/uL — AB (ref 4.0–10.5)

## 2014-09-15 LAB — GLUCOSE, CAPILLARY
GLUCOSE-CAPILLARY: 182 mg/dL — AB (ref 65–99)
Glucose-Capillary: 161 mg/dL — ABNORMAL HIGH (ref 65–99)
Glucose-Capillary: 192 mg/dL — ABNORMAL HIGH (ref 65–99)
Glucose-Capillary: 166 mg/dL — ABNORMAL HIGH (ref 65–99)
Glucose-Capillary: 166 mg/dL — ABNORMAL HIGH (ref 65–99)

## 2014-09-15 LAB — VANCOMYCIN, RANDOM: VANCOMYCIN RM: 25 ug/mL

## 2014-09-15 MED ORDER — SILVER SULFADIAZINE 1 % EX CREA
TOPICAL_CREAM | Freq: Every day | CUTANEOUS | Status: DC
Start: 2014-09-15 — End: 2014-10-04

## 2014-09-15 MED ORDER — PIPERACILLIN-TAZOBACTAM IN DEX 2-0.25 GM/50ML IV SOLN
2.2500 g | Freq: Three times a day (TID) | INTRAVENOUS | Status: DC
  Administered 2014-09-15 – 2014-09-17 (×6): 2.25 g via INTRAVENOUS
  Filled 2014-09-15 (×7): qty 50

## 2014-09-15 MED ORDER — ALPRAZOLAM 0.25 MG PO TABS: 0.2500 mg | ORAL_TABLET | Freq: Three times a day (TID) | ORAL | Status: AC | PRN

## 2014-09-15 MED ORDER — ALBUTEROL SULFATE (2.5 MG/3ML) 0.083% IN NEBU: 2.5000 mg | INHALATION_SOLUTION | RESPIRATORY_TRACT | Status: AC | PRN

## 2014-09-15 MED ORDER — OXYCODONE HCL 5 MG PO TABS: 5.0000 mg | ORAL_TABLET | ORAL | Status: DC | PRN

## 2014-09-15 MED ORDER — GLUCERNA SHAKE PO LIQD: 237.0000 mL | Freq: Two times a day (BID) | ORAL | Status: DC

## 2014-09-15 NOTE — Progress Notes (Signed)
Orthopedic Tech Progress Note Patient Details:  [name] 1932-06-10 [id number]  Ortho Devices Type of Ortho Device: Louretta Parma boot Ortho Device/Splint Location: lle Ortho Device/Splint Interventions: Application   [name] 09/15/2014, 9:03 AM

## 2014-09-15 NOTE — Progress Notes (Signed)
650 mg of tylenol given for tempt of 100. Temperature equals 100.5 upon reassessment. NP on call notified. Will continue to monitor.

## 2014-09-15 NOTE — Progress Notes (Signed)
Temperature equals 99.5.

## 2014-09-15 NOTE — Progress Notes (Addendum)
ANTIBIOTIC CONSULT NOTE - FOLLOW UP  Pharmacy Consult for Vancomycin, Zosyn Indication: foot wound, HCAP  Allergies  Allergen Reactions  . Ace Inhibitors Other (See Comments)    HYPOTENSION  . Beta Adrenergic Blockers Other (See Comments)     use cautiously secondary to 2nd degree heart block, hypotension    Patient Measurements: Height: 5' 10.5" (179.1 cm) Weight: 225 lb 5 oz (102.2 kg) IBW/kg (Calculated) : 74.15  Vital Signs: Temp: 98.2 F (36.8 C) (07/27 0933) Temp Source: Oral (07/27 0933) BP: 145/51 mmHg (07/27 0933) Pulse Rate: 58 (07/27 0933) Intake/Output from previous day: 07/26 0701 - 07/27 0700 In: 870 [P.O.:480; I.V.:240; IV Piggyback:150] Out: 100 [Urine:100] Intake/Output from this shift: Total I/O In: 240 [P.O.:240] Out: -   Labs:  Recent Labs  09/13/14 1720 09/14/14 0520 09/15/14 0531  WBC 16.4* 19.6* 18.3*  HGB 9.0* 9.3* 8.2*  PLT 415* 441* 435*  CREATININE 3.21* 3.66* 4.45*   Estimated Creatinine Clearance: 16 mL/min (by C-G formula based on Cr of 4.45). No results for input(s): VANCOTROUGH, VANCOPEAK, VANCORANDOM, GENTTROUGH, GENTPEAK, GENTRANDOM, TOBRATROUGH, TOBRAPEAK, TOBRARND, AMIKACINPEAK, AMIKACINTROU, AMIKACIN in the last 72 hours.   Microbiology: Recent Results (from the past 720 hour(s))  MRSA PCR Screening     Status: None   Collection Time: 09/06/14 10:07 PM  Result Value Ref Range Status   MRSA by PCR NEGATIVE NEGATIVE Final    Comment:        The GeneXpert MRSA Assay (FDA approved for NASAL specimens only), is one component of a comprehensive MRSA colonization surveillance program. It is not intended to diagnose MRSA infection nor to guide or monitor treatment for MRSA infections.     Anti-infectives    Start     Dose/Rate Route Frequency Ordered Stop   09/15/14 1600  piperacillin-tazobactam (ZOSYN) IVPB 2.25 g     2.25 g 100 mL/hr over 30 Minutes Intravenous Every 8 hours 09/15/14 0923     09/14/14 0600   ceFAZolin (ANCEF) IVPB 2 g/50 mL premix  Status:  Discontinued     2 g 100 mL/hr over 30 Minutes Intravenous On call to O.R. 09/13/14 1710 09/13/14 1946   09/13/14 1711  ceFAZolin (ANCEF) 2-3 GM-% IVPB SOLR    Comments:  Ara Kussmaul   : cabinet override      09/13/14 1711 09/14/14 0529   09/09/14 1000  vancomycin (VANCOCIN) 1,500 mg in sodium chloride 0.9 % 500 mL IVPB  Status:  Discontinued     1,500 mg 250 mL/hr over 120 Minutes Intravenous Every 48 hours 09/07/14 0849 09/15/14 1116   09/07/14 1600  piperacillin-tazobactam (ZOSYN) IVPB 3.375 g  Status:  Discontinued     3.375 g 12.5 mL/hr over 240 Minutes Intravenous Every 8 hours 09/07/14 0849 09/15/14 0923   09/07/14 0900  vancomycin (VANCOCIN) 2,250 mg in sodium chloride 0.9 % 500 mL IVPB     2,250 mg 250 mL/hr over 120 Minutes Intravenous  Once 09/07/14 0820 09/07/14 1141   09/07/14 0900  piperacillin-tazobactam (ZOSYN) IVPB 3.375 g     3.375 g 100 mL/hr over 30 Minutes Intravenous  Once 09/07/14 0849 09/07/14 1011      Assessment: 81 y.o. male continuing on vanc/zosyn for DM foot wound, necrotic gangrene with ulceration, purulent drainage - now s/p R AKA on 7/25. Post-op fever of 101.3 yesterday, and MD noted to continue vanc/zosyn for now and re-assess today. Currently afebrile, wbc improved to 18.3. Renal function worsening, 2.96 on admit, now up to 4.45 (baseline  2-2.5 with CKD4), CrCl~16, UOP recorded 100cc/24h. RN had just started vanc ~10-15 minutes and was told to hold. VR today came back high at 25 (last full dose 7/25). Will hold vanc until levels therapeutic.  7/21 CXR shows pulm edema with possible superimposed infiltrate/PNA.  7/19 >> Vanc >> 7/19 >> Zosyn >>  7/27 VR: 25 on 1500mg  q48h **Calculated ~20 hours until back in therapeutic range - could check another VR tomorrow afternoon if still on vanc  7/23: 0900 VT = 14 before 2nd dose of 1500mg  q48h regimen (at [facility] prior to transfer)  No cultures MRSA screen  neg  Goal of Therapy:  Vancomycin trough level 15-20 mcg/ml  Eradication of infection  Plan:  -Reduce Zosyn to 2.25 IV q8h with worsening renal function  -Will hold Vanc and check another VR after ~20h (probably tomorrow afternoon if still on vanc)  -Mon clinical progress / WBC / Tmax / renal function  -F/U post-op abx LOT / abx de-escalation plans    Elicia Lamp, PharmD Clinical Pharmacist Pager [phone] 09/15/2014 11:35 AM

## 2014-09-15 NOTE — Clinical Social Work Note (Signed)
CSW contacted by Barb Merino, admissions director at [facility] to get an update on patient. She has sent additional clinicals to [facility] for another authorization as last auth ended Sunday. CSW will continue to follow and keep SNF updated on patient's readiness for d/c.  Rei Medlen Givens, MSW, LCSW Licensed Clinical Social Worker [place] [phone]

## 2014-09-15 NOTE — Progress Notes (Signed)
[facility]                                                                              Patient Demographics  [name], is a 81 y.o. male, DOB - 1933-02-09, ENM:[id number]  Admit date - 09/06/2014   Admitting Physician Oswald Hillock, MD  Outpatient Primary MD for the patient is [name], MD  LOS - 9   Chief Complaint  Patient presents with  . Leg Swelling      HPI on 09/06/2014 by Dr. Eleonore Chiquito 81 yr old male is presenting for worsening chronic edema and difficulty ambulating due to pain. Worsening pain in left and right lower legs has been constantly aching for approximately 1 year and is relieved by rest and worsened by activity. Associated symptoms include fatigue and swelling. He has chronic lower extremity at baseline but it is worsening and family reports the "meat is falling off" from his legs. No fever.  Due to his independent living situation, he has a difficult time caring for himself and (per son-in-law at bedside) has a nurse from [facility] making visits to his house twice a day.   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 He will follow the patient as outpatient.  Hypothyroid -on synthroid 125 mcg  History of prostate cancer-15 yrs ago -stable   Anemia of chronic disease, CKD -Hemoglobin 8.2, daily monitor CBC  Essential hypertension -on hydralazine 100 mg tablet, clonidine patch 0.1 mg -reasonable inpatient control   Dyslipidemia -continue statin   CAD, moderate -pt denies any chest pain.   Acute on CKD stage 4:  -Will hold furosemide -Creatinine trending upward of 5.45 -Spoke with Dr. Justin Mend, nephrology, recommended to continue watching -Continue to monitor BMP  Sepsis -Patient spiked a fever with leukocytosis. -Treatment plan as above  Obesity  -Body mass index is 32.1 kg/(m^2).  Code Status: DNR   Family Communication: Daughter at bedside  Disposition Plan: Admitted  Time Spent in minutes   30 minutes  Procedures  Right AKA  Consults   Orthopedic surgery  DVT Prophylaxis  Heparin  Lab Results  Component Value Date   PLT 435* 09/15/2014    Medications  Scheduled Meds: . allopurinol  100 mg Oral QHS  . amiodarone  100 mg Oral Daily  . amLODipine  10 mg Oral Daily  . aspirin EC  81 mg Oral Daily  . atorvastatin  40 mg Oral q1800  . citalopram  20 mg Oral Daily  . feeding supplement (GLUCERNA SHAKE)  237 mL Oral BID BM  . heparin  5,000 Units Subcutaneous 3 times per day  . hydrALAZINE  100 mg Oral TID  . insulin aspart  0-9 Units Subcutaneous TID WC  . insulin detemir  30 Units Subcutaneous Daily  . isosorbide mononitrate  60 mg Oral Daily  . levothyroxine  125 mcg Oral QAC breakfast  . pantoprazole  40 mg Oral Daily  . piperacillin-tazobactam (ZOSYN)  IV  2.25 g Intravenous Q8H  . silver sulfADIAZINE   Topical Daily  . sodium chloride  3 mL Intravenous Q12H   Continuous Infusions: . sodium chloride 10 mL/hr at 09/13/14 0111  . sodium chloride 10 mL/hr at 09/13/14 2007   PRN Meds:.sodium chloride, acetaminophen **OR**  acetaminophen, albuterol, ALPRAZolam, methocarbamol **OR** methocarbamol (ROBAXIN)  IV, metoCLOPramide **OR** metoCLOPramide (REGLAN) injection, morphine injection, ondansetron **OR** ondansetron (ZOFRAN) IV, oxyCODONE, sodium chloride, zolpidem  Antibiotics    Anti-infectives    Start     Dose/Rate Route Frequency Ordered Stop   09/15/14 1600  piperacillin-tazobactam (ZOSYN) IVPB 2.25 g     2.25 g 100 mL/hr over 30 Minutes Intravenous Every 8 hours 09/15/14 0923     09/14/14 0600  ceFAZolin (ANCEF) IVPB 2 g/50 mL premix  Status:  Discontinued     2 g 100 mL/hr over 30 Minutes Intravenous On call to O.R. 09/13/14 1710 09/13/14 1946   09/13/14 1711  ceFAZolin (ANCEF) 2-3 GM-% IVPB SOLR    CommentsAra Kussmaul   : cabinet override      09/13/14 1711 09/14/14 0529   09/09/14 1000  vancomycin (VANCOCIN) 1,500 mg in sodium chloride 0.9 % 500 mL IVPB  Status:  Discontinued     1,500 mg 250 mL/hr over 120 Minutes Intravenous Every 48 hours 09/07/14 0849 09/15/14 1116   09/07/14 1600  piperacillin-tazobactam (ZOSYN) IVPB 3.375 g  Status:  Discontinued     3.375 g 12.5 mL/hr over 240 Minutes Intravenous Every 8 hours 09/07/14 0849 09/15/14 0923   09/07/14 0900  vancomycin (VANCOCIN) 2,250 mg in sodium chloride 0.9 % 500 mL IVPB     2,250 mg 250 mL/hr over 120 Minutes Intravenous  Once 09/07/14 0820 09/07/14 1141   09/07/14 0900  piperacillin-tazobactam (ZOSYN) IVPB 3.375 g     3.375 g 100 mL/hr over 30 Minutes Intravenous  Once 09/07/14 0849 09/07/14 1011      Subjective:   [name] seen and examined today.  Patient moaning during dressing change.  Daughter at bedside states "he moans all the time."    Denies chest pain, shortness of breath, abdominal pain.   Objective:   Filed Vitals:   09/15/14 0100 09/15/14 0410 09/15/14 0555 09/15/14 0933  BP:  124/48  145/51  Pulse:  53  58  Temp: 99.5 F (37.5 C) 98.2 F (36.8 C)  98.2 F (36.8 C)  TempSrc: Oral Oral  Oral    Resp:  14  16  Height:      Weight:      SpO2:  89% 93% 94%    Wt Readings from Last 3 Encounters:  09/14/14 102.2 kg (225 lb 5 oz)  06/22/14 110.678 kg (244 lb)  06/09/14 112.855 kg (248 lb 12.8 oz)     Intake/Output Summary (Last 24 hours) at 09/15/14 1404 Last data filed at 09/15/14 1300  Gross per 24 hour  Intake    870 ml  Output    250 ml  Net    620 ml    Exam  General: Well developed, well nourished, NAD, appears stated age  HEENT: NCAT, mucous membranes moist.   Cardiovascular: S1 S2 auscultated, no rubs, murmurs or gallops. Regular rate and rhythm.  Respiratory: Diminished breath sounds, but clear  Abdomen: Soft, nontender, nondistended, + bowel sounds  Extremities: R AKA, LLE unna boot  Data Review   Micro Results Recent Results (from the past 240 hour(s))  MRSA PCR Screening     Status: None   Collection Time: 09/06/14 10:07 PM  Result Value Ref Range Status   MRSA by PCR NEGATIVE NEGATIVE Final    Comment:        The GeneXpert MRSA Assay (FDA approved for NASAL specimens only), is one component of a comprehensive MRSA colonization surveillance program. It is not intended to diagnose MRSA infection nor to guide or monitor treatment for MRSA infections.     Radiology Reports Dg Chest 1 View  09/07/2014   CLINICAL DATA:  Fluid overload, history CHF, COPD, hypertension, diabetes mellitus, prostate cancer  EXAM: CHEST  1 VIEW  COMPARISON:  Portable exam 1902 hours compared to 09/06/2014  FINDINGS: Enlargement of cardiac silhouette.  Atherosclerotic calcification aorta.  Mediastinal contour stable.  Diffuse pulmonary infiltrates question edema versus less likely infection, increased versus previous study.  No pleural effusion or pneumothorax.  Sclerotic foci at the proximal LEFT humerus question related to metastatic disease  IMPRESSION: Enlargement of cardiac silhouette with diffuse BILATERAL infiltrates favoring pulmonary edema over infection.   Sclerotic foci at the proximal LEFT humerus cannot exclude sclerotic osseous metastases in this patient with a history of prostate cancer.   Electronically Signed   By: Lavonia Dana M.D.   On: 09/07/2014 20:02   Dg Chest 2 View  09/09/2014   CLINICAL DATA:  81 year old male with fever, cough and congestion  EXAM: CHEST  2 VIEW  COMPARISON:  Prior chest x-ray 09/07/2014  FINDINGS: Overall, there has been some improvement in diffuse bilateral interstitial and airspace opacities. However, there remains some focal airspace opacification within the right mid lung. No pleural effusion. No pneumothorax. Stable cardiomegaly and mediastinal contours. Atherosclerotic calcification remains present within the transverse aorta.  IMPRESSION: 1. Improving pulmonary edema. 2. Residual patchy airspace opacity in the right mid lung in the region of the minor fissure may represent superimposed infiltrate/pneumonia, or perhaps some fluid trapped within the minor fissure. 3. Stable cardiomegaly.   Electronically Signed   By: Jacqulynn Cadet M.D.   On: 09/09/2014 10:21   Nm Pulmonary Perf And Vent  09/08/2014   CLINICAL DATA:  Shortness of breath.  EXAM: NUCLEAR MEDICINE VENTILATION - PERFUSION LUNG SCAN  TECHNIQUE: Ventilation images were obtained in multiple projections using inhaled aerosol Tc-37m DTPA. Perfusion images were obtained in multiple projections after intravenous injection of Tc-73m MAA.  RADIOPHARMACEUTICALS:  39.2 Technetium-32m DTPA aerosol inhalation and 6.45 Technetium-85m MAA IV  COMPARISON:  Chest x-ray 09/07/2014.  FINDINGS: Ventilation: Severe bilateral ventilatory defects. No segmental sized perfusion defects noted.  Perfusion: No wedge shaped peripheral perfusion defects to suggest acute pulmonary embolism.  IMPRESSION: Severe bilateral ventilatory defects consistent with bilateral airspace disease noted on recent chest x-ray. No perfusion defects noted to suggest pulmonary embolus. Low probability scan  for pulmonary embolus.   Electronically Signed   By: Marcello Moores  Register   On: 09/08/2014 12:12   Dg Chest Portable 1 View  09/06/2014   CLINICAL DATA:  Lower extremity  weakness  EXAM: PORTABLE CHEST - 1 VIEW  COMPARISON:  March 10, 2014  FINDINGS: There is scarring in both mid lung regions. There is no frank edema or consolidation. Heart is mildly enlarged with pulmonary vascular within normal limits. There is atherosclerotic change in aorta. The bony structures appear stable compared to prior study without focal lesion. Several bony structures appear rather sclerotic a particularly in the medial proximal right humerus, incompletely visualized on this study.  IMPRESSION: Areas of lung scarring. No frank edema or consolidation. Several bones appear somewhat sclerotic. This finding may warrant prostate evaluation given the propensity of prostate carcinoma to present with sclerotic bony metastases.   Electronically Signed   By: Lowella Grip III M.D.   On: 09/06/2014 09:20   Dg Foot Complete Right  09/06/2014   CLINICAL DATA:  Right foot pain, wound.  EXAM: RIGHT FOOT COMPLETE - 3+ VIEW  COMPARISON:  None.  FINDINGS: Soft tissue swelling within the right foot. No acute bony abnormality. No acute fracture, subluxation or dislocation. Possible old healed fractures in the proximal second and third metatarsals. No soft tissue gas.  IMPRESSION: No acute bony abnormality.   Electronically Signed   By: Rolm Baptise M.D.   On: 09/06/2014 14:55    CBC  Recent Labs Lab 09/10/14 0353 09/12/14 1055 09/13/14 1720 09/14/14 0520 09/15/14 0531  WBC 15.1* 16.5* 16.4* 19.6* 18.3*  HGB 9.1* 8.9* 9.0* 9.3* 8.2*  HCT 29.5* 29.7* 29.7* 30.5* 26.6*  PLT 497* 412* 415* 441* 435*  MCV 79.9 82.5 80.7 82.7 81.1  MCH 24.7* 24.7* 24.5* 25.2* 25.0*  MCHC 30.8 30.0 30.3 30.5 30.8  RDW 19.5* 19.7* 19.6* 20.0* 19.9*  LYMPHSABS  --  1.2 0.8  --   --   MONOABS  --  0.6 1.1*  --   --   EOSABS  --  0.5 0.4  --   --     BASOSABS  --  0.0 0.0  --   --     Chemistries   Recent Labs Lab 09/10/14 0353 09/12/14 1055 09/13/14 1720 09/14/14 0520 09/15/14 0531  NA 145 139 143 136 138  K 3.6 3.5 3.8 3.8 4.0  CL 110 106 111 103 104  CO2 26 25 23  19* 23  GLUCOSE 38* 155* 130* 183* 181*  BUN 56* 53* 51* 55* 64*  CREATININE 2.47* 2.96* 3.21* 3.66* 4.45*  CALCIUM 8.4* 8.2* 8.4* 8.1* 8.2*  AST  --   --  24  --   --   ALT  --   --  22  --   --   ALKPHOS  --   --  120  --   --   BILITOT  --   --  0.6  --   --    ------------------------------------------------------------------------------------------------------------------ estimated creatinine clearance is 16 mL/min (by C-G formula based on Cr of 4.45). ------------------------------------------------------------------------------------------------------------------ No results for input(s): HGBA1C in the last 72 hours. ------------------------------------------------------------------------------------------------------------------ No results for input(s): CHOL, HDL, LDLCALC, TRIG, CHOLHDL, LDLDIRECT in the last 72 hours. ------------------------------------------------------------------------------------------------------------------ No results for input(s): TSH, T4TOTAL, T3FREE, THYROIDAB in the last 72 hours.  Invalid input(s): FREET3 ------------------------------------------------------------------------------------------------------------------ No results for input(s): VITAMINB12, FOLATE, FERRITIN, TIBC, IRON, RETICCTPCT in the last 72 hours.  Coagulation profile  Recent Labs Lab 09/13/14 1720  INR 1.25    No results for input(s): DDIMER in the last 72 hours.  Cardiac Enzymes No results for input(s): CKMB, TROPONINI, MYOGLOBIN in the last 168 hours.  Invalid input(s): CK ------------------------------------------------------------------------------------------------------------------  Invalid input(s): POCBNP    Kaycee Haycraft D.O.  on 09/15/2014 at 2:04 PM  Between 7am to 7pm - Pager - [phone]  After 7pm go to [email or web address] - password [email or web address]  And look for the night coverage person covering for me after hours  [facility] Office  [phone]

## 2014-09-16 ENCOUNTER — Telehealth: Payer: Self-pay

## 2014-09-16 ENCOUNTER — Inpatient Hospital Stay (HOSPITAL_COMMUNITY): Payer: Medicare PPO

## 2014-09-16 LAB — CBC
HEMATOCRIT: 26.2 % — AB (ref 39.0–52.0)
HEMOGLOBIN: 8.2 g/dL — AB (ref 13.0–17.0)
MCH: 25.2 pg — ABNORMAL LOW (ref 26.0–34.0)
MCHC: 31.3 g/dL (ref 30.0–36.0)
MCV: 80.6 fL (ref 78.0–100.0)
Platelets: 471 10*3/uL — ABNORMAL HIGH (ref 150–400)
RBC: 3.25 MIL/uL — ABNORMAL LOW (ref 4.22–5.81)
RDW: 20.2 % — AB (ref 11.5–15.5)
WBC: 19.7 10*3/uL — AB (ref 4.0–10.5)

## 2014-09-16 LAB — VANCOMYCIN, RANDOM: Vancomycin Rm: 22 ug/mL

## 2014-09-16 LAB — GLUCOSE, CAPILLARY
GLUCOSE-CAPILLARY: 224 mg/dL — AB (ref 65–99)
GLUCOSE-CAPILLARY: 175 mg/dL — AB (ref 65–99)
GLUCOSE-CAPILLARY: 144 mg/dL — AB (ref 65–99)
Glucose-Capillary: 195 mg/dL — ABNORMAL HIGH (ref 65–99)

## 2014-09-16 LAB — BASIC METABOLIC PANEL
Anion gap: 13 (ref 5–15)
BUN: 73 mg/dL — AB (ref 6–20)
CALCIUM: 8 mg/dL — AB (ref 8.9–10.3)
CO2: 22 mmol/L (ref 22–32)
Chloride: 104 mmol/L (ref 101–111)
Creatinine, Ser: 5.08 mg/dL — ABNORMAL HIGH (ref 0.61–1.24)
GFR, EST AFRICAN AMERICAN: 11 mL/min — AB (ref >60)
GFR, EST NON AFRICAN AMERICAN: 10 mL/min — AB (ref >60)
GLUCOSE: 172 mg/dL — AB (ref 65–99)
POTASSIUM: 3.8 mmol/L (ref 3.5–5.1)
Sodium: 139 mmol/L (ref 135–145)

## 2014-09-16 MED ORDER — SODIUM CHLORIDE 0.9 % IV SOLN
INTRAVENOUS | Status: DC
  Administered 2014-09-16 – 2014-09-17 (×4): via INTRAVENOUS

## 2014-09-16 NOTE — Progress Notes (Signed)
ANTIBIOTIC CONSULT NOTE - FOLLOW UP  Pharmacy Consult for Vancomycin and Zosyn Indication: pneumonia and cellulitis  Allergies  Allergen Reactions  . Ace Inhibitors Other (See Comments)    HYPOTENSION  . Beta Adrenergic Blockers Other (See Comments)     use cautiously secondary to 2nd degree heart block, hypotension    Patient Measurements: Height: 5' 10.5" (179.1 cm) Weight: 232 lb (105.235 kg) IBW/kg (Calculated) : 74.15 Adjusted Body Weight:   Vital Signs: Temp: 98.3 F (36.8 C) (07/28 0948) Temp Source: Oral (07/28 0948) BP: 128/65 mmHg (07/28 0948) Pulse Rate: 70 (07/28 0948) Intake/Output from previous day: 07/27 0701 - 07/28 0700 In: 700 [P.O.:600; IV Piggyback:100] Out: 650 [Urine:650] Intake/Output from this shift: Total I/O In: 630 [P.O.:630] Out: -   Labs:  Recent Labs  09/14/14 0520 09/15/14 0531 09/16/14 0502  WBC 19.6* 18.3* 19.7*  HGB 9.3* 8.2* 8.2*  PLT 441* 435* 471*  CREATININE 3.66* 4.45* 5.08*   Estimated Creatinine Clearance: 14.2 mL/min (by C-G formula based on Cr of 5.08).  Recent Labs  09/15/14 1245 09/16/14 1424  VANCORANDOM 25 56     Microbiology: Recent Results (from the past 720 hour(s))  MRSA PCR Screening     Status: None   Collection Time: 09/06/14 10:07 PM  Result Value Ref Range Status   MRSA by PCR NEGATIVE NEGATIVE Final    Comment:        The GeneXpert MRSA Assay (FDA approved for NASAL specimens only), is one component of a comprehensive MRSA colonization surveillance program. It is not intended to diagnose MRSA infection nor to guide or monitor treatment for MRSA infections.   Culture, blood (routine x 2)     Status: None (Preliminary result)   Collection Time: 09/15/14  3:20 PM  Result Value Ref Range Status   Specimen Description BLOOD LEFT ARM  Final   Special Requests BOTTLES DRAWN AEROBIC AND ANAEROBIC 10CC  Final   Culture NO GROWTH < 24 HOURS  Final   Report Status PENDING  Incomplete  Culture,  blood (routine x 2)     Status: None (Preliminary result)   Collection Time: 09/15/14  3:30 PM  Result Value Ref Range Status   Specimen Description BLOOD LEFT HAND  Final   Special Requests BOTTLES DRAWN AEROBIC ONLY 3CC  Final   Culture NO GROWTH < 24 HOURS  Final   Report Status PENDING  Incomplete    Anti-infectives    Start     Dose/Rate Route Frequency Ordered Stop   09/15/14 1600  piperacillin-tazobactam (ZOSYN) IVPB 2.25 g     2.25 g 100 mL/hr over 30 Minutes Intravenous Every 8 hours 09/15/14 0923     09/14/14 0600  ceFAZolin (ANCEF) IVPB 2 g/50 mL premix  Status:  Discontinued     2 g 100 mL/hr over 30 Minutes Intravenous On call to O.R. 09/13/14 1710 09/13/14 1946   09/13/14 1711  ceFAZolin (ANCEF) 2-3 GM-% IVPB SOLR    CommentsAra Kussmaul   : cabinet override      09/13/14 1711 09/14/14 0529   09/09/14 1000  vancomycin (VANCOCIN) 1,500 mg in sodium chloride 0.9 % 500 mL IVPB  Status:  Discontinued     1,500 mg 250 mL/hr over 120 Minutes Intravenous Every 48 hours 09/07/14 0849 09/15/14 1116   09/07/14 1600  piperacillin-tazobactam (ZOSYN) IVPB 3.375 g  Status:  Discontinued     3.375 g 12.5 mL/hr over 240 Minutes Intravenous Every 8 hours 09/07/14 0849 09/15/14  4174   09/07/14 0900  vancomycin (VANCOCIN) 2,250 mg in sodium chloride 0.9 % 500 mL IVPB     2,250 mg 250 mL/hr over 120 Minutes Intravenous  Once 09/07/14 0820 09/07/14 1141   09/07/14 0900  piperacillin-tazobactam (ZOSYN) IVPB 3.375 g     3.375 g 100 mL/hr over 30 Minutes Intravenous  Once 09/07/14 0849 09/07/14 1011      Assessment: 81yo male on Vancomycin and Zosyn, originally started for cellulitis and now with possible RML pneumonia as well per CXR 7/27.  Vancomycin dose has been held for increasing Cr and we are dosing by random levels currently.  Random level today was 22.  Zosyn dosing is appropriate for renal function.  Blood cultures drawn 7/27 and urine culture today.  Goal of Therapy:   Vancomycin trough level 15-20 mcg/ml  Plan:  Adjust goal Vanc Trough up to cover pneumonia No Vancomycin dose today F/U pending cultures Watch renal fxn  Gracy Bruins, PharmD Clinical Pharmacist [facility]

## 2014-09-16 NOTE — Progress Notes (Signed)
[facility]                                                                              Patient Demographics  [name], is a 81 y.o. male, DOB - [birth year], UQJ:[id number]  Admit date - 09/06/2014   Admitting Physician Oswald Hillock, MD  Outpatient Primary MD for the patient is [name], MD  LOS - 10   Chief Complaint  Patient presents with  . Leg Swelling      HPI on 09/06/2014 by Dr. Eleonore Chiquito 81 yr old male is presenting for worsening chronic edema and difficulty ambulating due to pain. Worsening pain in left and right lower legs has been constantly aching for approximately 1 year and is relieved by rest and worsened by activity. Associated symptoms include fatigue and swelling. He has chronic lower extremity at baseline but it is worsening and family reports the "meat is falling off" from his legs. No fever.  Due to his independent living situation, he has a difficult time caring for himself and (per son-in-law at bedside) has a nurse from [place] making visits to his house twice a day.   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 He will follow the patient as outpatient.  Hypothyroid -on synthroid 125 mcg  History of prostate cancer-15 yrs ago -stable   Essential hypertension -on hydralazine 100 mg tablet, clonidine patch 0.1 mg -reasonable inpatient control   Dyslipidemia -continue statin   CAD, moderate -pt denies any chest pain.   Acute on CKD stage 4:  -Torsemide discontinued  -Creatinine trending upward of 5.08 -Spoke with Dr. Justin Mend, nephrology, recommended to continue watching -Will start IVF and obtain renal US -Continue to monitor BMP  Obesity  -Body mass index is 32.1 kg/(m^2).  Code Status: DNR   Family Communication: None at bedside  Disposition Plan: Admitted.  Continue to monitor.  Patient has worsening Creatinine and Leukocytosis.   Time Spent in minutes   30 minutes  Procedures  Right AKA  Consults   Orthopedic surgery  DVT Prophylaxis  Heparin  Lab Results  Component Value Date   PLT 471* 09/16/2014    Medications  Scheduled Meds: . allopurinol  100 mg Oral QHS  . amiodarone  100 mg Oral Daily  .  amLODipine  10 mg Oral Daily  . aspirin EC  81 mg Oral Daily  . atorvastatin  40 mg Oral q1800  . citalopram  20 mg Oral Daily  . feeding supplement (GLUCERNA SHAKE)  237 mL Oral BID BM  . heparin  5,000 Units Subcutaneous 3 times per day  . hydrALAZINE  100 mg Oral TID  . insulin aspart  0-9 Units Subcutaneous TID WC  . insulin detemir  30 Units Subcutaneous Daily  . isosorbide mononitrate  60 mg Oral Daily  . levothyroxine  125 mcg Oral QAC breakfast  . pantoprazole  40 mg Oral Daily  . piperacillin-tazobactam (ZOSYN)  IV  2.25 g Intravenous Q8H  . silver sulfADIAZINE   Topical Daily  . sodium chloride  3 mL Intravenous Q12H   Continuous Infusions: . sodium chloride 10 mL/hr at 09/13/14 0111   . sodium chloride 10 mL/hr at 09/13/14 2007  . sodium chloride 100 mL/hr at 09/16/14 0958   PRN Meds:.sodium chloride, acetaminophen **OR** acetaminophen, albuterol, ALPRAZolam, methocarbamol **OR** methocarbamol (ROBAXIN)  IV, metoCLOPramide **OR** metoCLOPramide (REGLAN) injection, morphine injection, ondansetron **OR** ondansetron (ZOFRAN) IV, oxyCODONE, sodium chloride, zolpidem  Antibiotics    Anti-infectives    Start     Dose/Rate Route Frequency Ordered Stop   09/15/14 1600  piperacillin-tazobactam (ZOSYN) IVPB 2.25 g     2.25 g 100 mL/hr over 30 Minutes Intravenous Every 8 hours 09/15/14 0923     09/14/14 0600  ceFAZolin (ANCEF) IVPB 2 g/50 mL premix  Status:  Discontinued     2 g 100 mL/hr over 30 Minutes Intravenous On call to O.R. 09/13/14 1710 09/13/14 1946   09/13/14 1711  ceFAZolin (ANCEF) 2-3 GM-% IVPB SOLR    CommentsAra Kussmaul   : cabinet override      09/13/14 1711 09/14/14 0529   09/09/14 1000  vancomycin (VANCOCIN) 1,500 mg in sodium chloride 0.9 % 500 mL IVPB  Status:  Discontinued     1,500 mg 250 mL/hr over 120 Minutes Intravenous Every 48 hours 09/07/14 0849 09/15/14 1116   09/07/14 1600  piperacillin-tazobactam (ZOSYN) IVPB 3.375 g  Status:  Discontinued     3.375 g 12.5 mL/hr over 240 Minutes Intravenous Every 8 hours 09/07/14 0849 09/15/14 0923   09/07/14 0900  vancomycin (VANCOCIN) 2,250 mg in sodium chloride 0.9 % 500 mL IVPB     2,250 mg 250 mL/hr over 120 Minutes Intravenous  Once 09/07/14 0820 09/07/14 1141   09/07/14 0900  piperacillin-tazobactam (ZOSYN) IVPB 3.375 g     3.375 g 100 mL/hr over 30 Minutes Intravenous  Once 09/07/14 0849 09/07/14 1011      Subjective:   Chalmers Cater Mckesson seen and examined today.  Patient denies chest pain, shortness of breath, abdominal pain, cough, dizziness, headache.  States he feels fine.   Objective:   Filed Vitals:   09/16/14 0752 09/16/14 0753 09/16/14 0754 09/16/14 0948  BP:    128/65  Pulse:    64 70  Temp:    98.3 F (36.8 C)  TempSrc:    Oral  Resp:   19 19  Height:      Weight:      SpO2: 87% 93% 95% 95%    Wt Readings from Last 3 Encounters:  09/15/14 105.235 kg (232 lb)  06/22/14 110.678 kg (244 lb)  06/09/14 112.855 kg (248 lb 12.8 oz)     Intake/Output Summary (Last 24 hours) at 09/16/14 1255 Last data filed at 09/16/14 9147  Gross  per 24 hour  Intake    970 ml  Output    500 ml  Net    470 ml    Exam  General: Well developed, well nourished, no distress  HEENT: NCAT, mucous membranes moist.   Cardiovascular: S1 S2 auscultated, RRR, no murmurs  Respiratory: Diminished breath sounds, but clear  Abdomen: Soft, nontender, nondistended, + bowel sounds  Extremities: R AKA, LLE unna boot  Data Review   Micro Results Recent Results (from the past 240 hour(s))  MRSA PCR Screening     Status: None   Collection Time: 09/06/14 10:07 PM  Result Value Ref Range Status   MRSA by PCR NEGATIVE NEGATIVE Final    Comment:        The GeneXpert MRSA Assay (FDA approved for NASAL specimens only), is one component of a comprehensive MRSA colonization surveillance program. It is not intended to diagnose MRSA infection nor to guide or monitor treatment for MRSA infections.   Culture, blood (routine x 2)     Status: None (Preliminary result)   Collection Time: 09/15/14  3:20 PM  Result Value Ref Range Status   Specimen Description BLOOD LEFT ARM  Final   Special Requests BOTTLES DRAWN AEROBIC AND ANAEROBIC 10CC  Final   Culture NO GROWTH < 24 HOURS  Final   Report Status PENDING  Incomplete  Culture, blood (routine x 2)     Status: None (Preliminary result)   Collection Time: 09/15/14  3:30 PM  Result Value Ref Range Status   Specimen Description BLOOD LEFT HAND  Final   Special Requests BOTTLES DRAWN AEROBIC ONLY 3CC  Final   Culture NO GROWTH < 24 HOURS  Final   Report Status PENDING  Incomplete    Radiology Reports Dg Chest 1 View  09/07/2014    CLINICAL DATA:  Fluid overload, history CHF, COPD, hypertension, diabetes mellitus, prostate cancer  EXAM: CHEST  1 VIEW  COMPARISON:  Portable exam 1902 hours compared to 09/06/2014  FINDINGS: Enlargement of cardiac silhouette.  Atherosclerotic calcification aorta.  Mediastinal contour stable.  Diffuse pulmonary infiltrates question edema versus less likely infection, increased versus previous study.  No pleural effusion or pneumothorax.  Sclerotic foci at the proximal LEFT humerus question related to metastatic disease  IMPRESSION: Enlargement of cardiac silhouette with diffuse BILATERAL infiltrates favoring pulmonary edema over infection.  Sclerotic foci at the proximal LEFT humerus cannot exclude sclerotic osseous metastases in this patient with a history of prostate cancer.   Electronically Signed   By: Lavonia Dana M.D.   On: 09/07/2014 20:02   Dg Chest 2 View  09/09/2014   CLINICAL DATA:  81 year old male with fever, cough and congestion  EXAM: CHEST  2 VIEW  COMPARISON:  Prior chest x-ray 09/07/2014  FINDINGS: Overall, there has been some improvement in diffuse bilateral interstitial and airspace opacities. However, there remains some focal airspace opacification within the right mid lung. No pleural effusion. No pneumothorax. Stable cardiomegaly and mediastinal contours. Atherosclerotic calcification remains present within the transverse aorta.  IMPRESSION: 1. Improving pulmonary edema. 2. Residual patchy airspace opacity in the right mid lung in the region of the minor fissure may represent superimposed infiltrate/pneumonia, or perhaps some fluid trapped within the minor fissure. 3. Stable cardiomegaly.   Electronically Signed   By: Jacqulynn Cadet M.D.   On: 09/09/2014 10:21   Nm Pulmonary Perf And Vent  09/08/2014   CLINICAL DATA:  Shortness of breath.  EXAM: NUCLEAR MEDICINE VENTILATION - PERFUSION LUNG SCAN  TECHNIQUE: Ventilation images were obtained in multiple projections using inhaled  aerosol Tc-69m DTPA. Perfusion images were obtained in multiple projections after intravenous injection of Tc-69m MAA.  RADIOPHARMACEUTICALS:  39.2 Technetium-61m DTPA aerosol inhalation and 6.45 Technetium-10m MAA IV  COMPARISON:  Chest x-ray 09/07/2014.  FINDINGS: Ventilation: Severe bilateral ventilatory defects. No segmental sized perfusion defects noted.  Perfusion: No wedge shaped peripheral perfusion defects to suggest acute pulmonary embolism.  IMPRESSION: Severe bilateral ventilatory defects consistent with bilateral airspace disease noted on recent chest x-ray. No perfusion defects noted to suggest pulmonary embolus. Low probability scan for pulmonary embolus.   Electronically Signed   By: Marcello Moores  Register   On: 09/08/2014 12:12   Dg Chest Port 1 View  09/15/2014   CLINICAL DATA:  Fever and difficulty breathing  EXAM: PORTABLE CHEST - 1 VIEW  COMPARISON:  September 09, 2014  FINDINGS: There is generalized interstitial edema. There remains slight alveolar consolidation in the mid right lung, less than on recent prior study. Heart is enlarged. The pulmonary vascular is within normal limits. No adenopathy.  IMPRESSION: Findings consistent with congestive heart failure. A small focus of pneumonia in the right mid lung cannot be excluded.   Electronically Signed   By: Lowella Grip III M.D.   On: 09/15/2014 16:53   Dg Chest Portable 1 View  09/06/2014   CLINICAL DATA:  Lower extremity weakness  EXAM: PORTABLE CHEST - 1 VIEW  COMPARISON:  March 10, 2014  FINDINGS: There is scarring in both mid lung regions. There is no frank edema or consolidation. Heart is mildly enlarged with pulmonary vascular within normal limits. There is atherosclerotic change in aorta. The bony structures appear stable compared to prior study without focal lesion. Several bony structures appear rather sclerotic a particularly in the medial proximal right humerus, incompletely visualized on this study.  IMPRESSION: Areas of lung  scarring. No frank edema or consolidation. Several bones appear somewhat sclerotic. This finding may warrant prostate evaluation given the propensity of prostate carcinoma to present with sclerotic bony metastases.   Electronically Signed   By: Lowella Grip III M.D.   On: 09/06/2014 09:20   Dg Foot Complete Right  09/06/2014   CLINICAL DATA:  Right foot pain, wound.  EXAM: RIGHT FOOT COMPLETE - 3+ VIEW  COMPARISON:  None.  FINDINGS: Soft tissue swelling within the right foot. No acute bony abnormality. No acute fracture, subluxation or dislocation. Possible old healed fractures in the proximal second and third metatarsals. No soft tissue gas.  IMPRESSION: No acute bony abnormality.   Electronically Signed   By: Rolm Baptise M.D.   On: 09/06/2014 14:55    CBC  Recent Labs Lab 09/12/14 1055 09/13/14 1720 09/14/14 0520 09/15/14 0531 09/16/14 0502  WBC 16.5* 16.4* 19.6* 18.3* 19.7*  HGB 8.9* 9.0* 9.3* 8.2* 8.2*  HCT 29.7* 29.7* 30.5* 26.6* 26.2*  PLT 412* 415* 441* 435* 471*  MCV 82.5 80.7 82.7 81.1 80.6  MCH 24.7* 24.5* 25.2* 25.0* 25.2*  MCHC 30.0 30.3 30.5 30.8 31.3  RDW 19.7* 19.6* 20.0* 19.9* 20.2*  LYMPHSABS 1.2 0.8  --   --   --   MONOABS 0.6 1.1*  --   --   --   EOSABS 0.5 0.4  --   --   --   BASOSABS 0.0 0.0  --   --   --     Chemistries   Recent Labs Lab 09/12/14 1055 09/13/14 1720 09/14/14 0520 09/15/14 0531 09/16/14 0502  NA 139  143 136 138 139  K 3.5 3.8 3.8 4.0 3.8  CL 106 111 103 104 104  CO2 25 23 19* 23 22  GLUCOSE 155* 130* 183* 181* 172*  BUN 53* 51* 55* 64* 73*  CREATININE 2.96* 3.21* 3.66* 4.45* 5.08*  CALCIUM 8.2* 8.4* 8.1* 8.2* 8.0*  AST  --  24  --   --   --   ALT  --  22  --   --   --   ALKPHOS  --  120  --   --   --   BILITOT  --  0.6  --   --   --    ------------------------------------------------------------------------------------------------------------------ estimated creatinine clearance is 14.2 mL/min (by C-G formula based on Cr  of 5.08). ------------------------------------------------------------------------------------------------------------------ No results for input(s): HGBA1C in the last 72 hours. ------------------------------------------------------------------------------------------------------------------ No results for input(s): CHOL, HDL, LDLCALC, TRIG, CHOLHDL, LDLDIRECT in the last 72 hours. ------------------------------------------------------------------------------------------------------------------ No results for input(s): TSH, T4TOTAL, T3FREE, THYROIDAB in the last 72 hours.  Invalid input(s): FREET3 ------------------------------------------------------------------------------------------------------------------ No results for input(s): VITAMINB12, FOLATE, FERRITIN, TIBC, IRON, RETICCTPCT in the last 72 hours.  Coagulation profile  Recent Labs Lab 09/13/14 1720  INR 1.25    No results for input(s): DDIMER in the last 72 hours.  Cardiac Enzymes No results for input(s): CKMB, TROPONINI, MYOGLOBIN in the last 168 hours.  Invalid input(s): CK ------------------------------------------------------------------------------------------------------------------ Invalid input(s): POCBNP    Bena Kobel D.O. on 09/16/2014 at 12:55 PM  Between 7am to 7pm - Pager - [phone]  After 7pm go to [email or web address] - password [email or web address]  And look for the night coverage person covering for me after hours  [facility] Office  [phone]

## 2014-09-16 NOTE — Progress Notes (Signed)
Patient ID: [name], male   DOB: January 08, 1933, 82 y.o.   MRN: [id number] Dressings clean and dry, patient moaning, not responding to questions, continue dressing changes.

## 2014-09-16 NOTE — Telephone Encounter (Signed)
Daughter called asking we call [place] and give the name and dosage of his injection. Called [facility] and talked with Gwenlyn Perking, the medication is aranesp 300 mcg q 3 weeks. Told Gwenlyn Perking this is not an order, the hospitalist will need to talk with Dr [name].  His pager number was given.  Called [name] back and explained.

## 2014-09-16 NOTE — Progress Notes (Signed)
Urine output since beginning of shift equals 250. Bladder scan done equals 16 ml of urine. NP on call notified.Will continue to monitor.

## 2014-09-16 NOTE — Progress Notes (Signed)
Physical Therapy Treatment Patient Details Name: [name] MRN: [id number] DOB: 1932-06-08 Today's Date: 09/16/2014    History of Present Illness 81 yo male brought to ED 09/06/14 with worseneing wounds of both legs, inability to care for self. H/O CHF, DM, CVA, myelodysplasticsyndrome, chronic ulcers of bith legs. Now s/p R LE AKA    PT Comments    Patient continued to work on mobility and balance.  Limited by lethargy and pain today.  Agree with need for SNF at discharge for continued therapy.  Follow Up Recommendations  SNF;Supervision/Assistance - 24 hour     Equipment Recommendations  None recommended by PT    Recommendations for Other Services       Precautions / Restrictions Precautions Precautions: Fall Restrictions Weight Bearing Restrictions: No    Mobility  Bed Mobility Overal bed mobility: Needs Assistance;+2 for physical assistance Bed Mobility: Supine to Sit;Sit to Supine     Supine to sit: Max assist;+2 for physical assistance Sit to supine: Max assist;+2 for physical assistance   General bed mobility comments: Verbal and tactile cues for technique.  Assisted patient to EOB using bed pads to move hips and raise trunk.  Once upright, patient leaning posteriorly and to left.  Verbal, visual, and tactile cues to work on moving to midline position and balance.  Required mod assist to maintain balance.  Patient tolerated sitting EOB x 7 minutes.  Returned to supine with +2 max assist.  Transfers                    Ambulation/Gait                 Stairs            Wheelchair Mobility    Modified Rankin (Stroke Patients Only)       Balance Overall balance assessment: Needs assistance Sitting-balance support: Bilateral upper extremity supported Sitting balance-Leahy Scale: Poor Sitting balance - Comments: Patient required UE support and mod assist to maintain balance today.  Unable to reach midline position or maintain balance.   Lethargy contributing (pain meds). Postural control: Posterior lean;Left lateral lean                          Cognition Arousal/Alertness: Lethargic;Suspect due to medications Behavior During Therapy: [facility] for tasks assessed/performed Overall Cognitive Status: Impaired/Different from baseline Area of Impairment: Attention;Following commands;Problem solving   Current Attention Level: Sustained   Following Commands: Follows one step commands inconsistently;Follows one step commands with increased time     Problem Solving: Slow processing;Decreased initiation;Difficulty sequencing;Requires verbal cues      Exercises      General Comments        Pertinent Vitals/Pain Pain Assessment: Faces Faces Pain Scale: Hurts whole lot Pain Location: Rt stump with movement Pain Descriptors / Indicators: Crying;Grimacing Pain Intervention(s): Limited activity within patient's tolerance;Repositioned;Premedicated before session    Home Living                      Prior Function            PT Goals (current goals can now be found in the care plan section) Progress towards PT goals: Progressing toward goals    Frequency  Min 2X/week    PT Plan Current plan remains appropriate    Co-evaluation             End of Session Equipment Utilized During Treatment: Oxygen  Activity Tolerance: Patient limited by lethargy;Patient limited by pain Patient left: in bed;with call bell/phone within reach;with bed alarm set;with family/visitor present     Time: 1451-1506 PT Time Calculation (min) (ACUTE ONLY): 15 min  Charges:  $Therapeutic Activity: 8-22 mins                    G Codes:      Despina Pole 2014/10/15, 6:54 PM Carita Pian. Sanjuana Kava, [place] Pager [phone]

## 2014-09-17 ENCOUNTER — Other Ambulatory Visit: Payer: Self-pay | Admitting: Internal Medicine

## 2014-09-17 DIAGNOSIS — A419 Sepsis, unspecified organism: Secondary | ICD-10-CM

## 2014-09-17 LAB — CBC
HCT: 26.1 % — ABNORMAL LOW (ref 39.0–52.0)
Hemoglobin: 8.1 g/dL — ABNORMAL LOW (ref 13.0–17.0)
MCH: 25.2 pg — ABNORMAL LOW (ref 26.0–34.0)
MCHC: 31 g/dL (ref 30.0–36.0)
MCV: 81.3 fL (ref 78.0–100.0)
PLATELETS: 492 10*3/uL — AB (ref 150–400)
RBC: 3.21 MIL/uL — AB (ref 4.22–5.81)
RDW: 20.2 % — ABNORMAL HIGH (ref 11.5–15.5)
WBC: 19.4 10*3/uL — ABNORMAL HIGH (ref 4.0–10.5)

## 2014-09-17 LAB — BASIC METABOLIC PANEL
ANION GAP: 11 (ref 5–15)
BUN: 73 mg/dL — ABNORMAL HIGH (ref 6–20)
CALCIUM: 7.9 mg/dL — AB (ref 8.9–10.3)
CO2: 21 mmol/L — AB (ref 22–32)
Chloride: 105 mmol/L (ref 101–111)
Creatinine, Ser: 4.97 mg/dL — ABNORMAL HIGH (ref 0.61–1.24)
GFR, EST AFRICAN AMERICAN: 11 mL/min — AB (ref >60)
GFR, EST NON AFRICAN AMERICAN: 10 mL/min — AB (ref >60)
Glucose, Bld: 117 mg/dL — ABNORMAL HIGH (ref 65–99)
POTASSIUM: 4 mmol/L (ref 3.5–5.1)
Sodium: 137 mmol/L (ref 135–145)

## 2014-09-17 LAB — URINE CULTURE: Culture: 30000

## 2014-09-17 LAB — GLUCOSE, CAPILLARY
GLUCOSE-CAPILLARY: 134 mg/dL — AB (ref 65–99)
Glucose-Capillary: 126 mg/dL — ABNORMAL HIGH (ref 65–99)
Glucose-Capillary: 133 mg/dL — ABNORMAL HIGH (ref 65–99)
Glucose-Capillary: 112 mg/dL — ABNORMAL HIGH (ref 65–99)

## 2014-09-17 MED ORDER — DARBEPOETIN ALFA 300 MCG/0.6ML IJ SOSY
300.0000 ug | PREFILLED_SYRINGE | Freq: Once | INTRAMUSCULAR | Status: AC
  Administered 2014-09-17: 300 ug via SUBCUTANEOUS
  Filled 2014-09-17: qty 0.6

## 2014-09-17 NOTE — Clinical Social Work Placement (Signed)
   CLINICAL SOCIAL WORK PLACEMENT  NOTE  Date:  09/17/2014  Patient Details  Name: [name] MRN: [id number] Date of Birth: [birth year]  Clinical Social Work is seeking post-discharge placement for this patient at the [place] level of care (*CSW will initial, date and re-position this form in  chart as items are completed):  Yes   Patient/family provided with [facility] Work Department's list of facilities offering this level of care within the geographic area requested by the patient (or if unable, by the patient's family).  Yes   Patient/family informed of their freedom to choose among providers that offer the needed level of care, that participate in Medicare, Medicaid or managed care program needed by the patient, have an available bed and are willing to accept the patient.  Yes   Patient/family informed of [facility]'s ownership interest in [facility] and [facility], as well as of the fact that they are under no obligation to receive care at these facilities.  PASRR submitted to EDS on 09/06/14     PASRR number received on       Existing PASRR number confirmed on 09/06/14     FL2 transmitted to all facilities in geographic area requested by pt/family on       FL2 transmitted to all facilities within larger geographic area on 09/06/14     Patient informed that his/her managed care company has contracts with or will negotiate with certain facilities, including the following:            Patient/family informed of bed offers received.  Patient chooses bed at  [facility]     Physician recommends and patient chooses bed at      Patient to be transferred to  [facility] on  .  Patient to be transferred to facility by  ambulance     Patient family notified on   of transfer.  Name of family member notified:        PHYSICIAN       Additional Comment:  09/17/14 - Per MD, patient will discharge to facility on Saturday,  j/30/16.   _______________________________________________ Sable Feil, LCSW 09/17/2014, 7:05 PM

## 2014-09-17 NOTE — Care Management Important Message (Signed)
Important Message  Patient Details  Name: [name] MRN: [id number] Date of Birth: 1932/12/05   Medicare Important Message Given:  Yes-second notification given    Pricilla Handler 09/17/2014, 12:39 PM

## 2014-09-17 NOTE — Progress Notes (Signed)
[facility]                                                                              Patient Demographics  [name], is a 81 y.o. male, DOB - 1932/07/20, YOK:[id number]  Admit date - 09/06/2014   Admitting Physician Oswald Hillock, MD  Outpatient Primary MD for the patient is [name], MD  LOS - 11   Chief Complaint  Patient presents with  . Leg Swelling      HPI on 09/06/2014 by Dr. Eleonore Chiquito 81 yr old male is presenting for worsening chronic edema and difficulty ambulating due to pain. Worsening pain in left and right lower legs has been constantly aching for approximately 1 year and is relieved by rest and worsened by activity. Associated symptoms include fatigue and swelling. He has chronic lower extremity at baseline but it is worsening and family reports the "meat is falling off" from his legs. No fever.  Due to his independent living situation, he has a difficult time caring for himself and (per son-in-law at bedside) has a nurse from [place] making visits to his house twice a day.   Assessment & Plan   Necrotic right foot/leg -Patient has chronic bilateral lower extremity sores, likely due to diabetes mellitus -Orthopedics consultation appreciated -Status post right AKA -Continue wound care -Continue Vanco (per pharmacy) and Zosyn -Patient will likely be discharged to SNF  Post op fever/ Sepsis secondary to possible HCAP -Currently afebrile however had a fever of 101.3 on 09/14/2014 -Continues to have leukocytosis -Spoke with Dr. 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 He will follow the patient as outpatient.  Hypothyroid -on synthroid 125 mcg  History of prostate cancer-15 yrs ago -stable   Essential hypertension -on hydralazine 100 mg tablet, clonidine patch 0.1 mg -reasonable inpatient control   Dyslipidemia -continue statin   CAD, moderate -pt denies any chest pain.   Acute on CKD stage 4 -Torsemide discontinued  -Creatinine trending downward 4.97 -Spoke with Dr. Justin Mend, nephrology, recommended to continue watching -Continue IVF -Renal US: negative hydronephrosis -Continue to monitor BMP   Obesity  -Body mass index is 32.1 kg/(m^2).  Code Status: DNR   Family Communication: None at bedside  Disposition Plan: Admitted.  Continue to monitor.   Time Spent in minutes   30 minutes  Procedures  Right AKA  Consults   Orthopedic surgery Dr. Linus Salmons, ID, via phone  DVT Prophylaxis  Heparin  Lab Results  Component Value Date   PLT 492* 09/17/2014  Medications  Scheduled Meds: . allopurinol  100 mg Oral QHS  . amiodarone  100 mg Oral Daily  . amLODipine  10 mg Oral Daily  . aspirin EC  81 mg Oral Daily  . atorvastatin  40 mg Oral q1800  . citalopram  20 mg Oral Daily  . feeding supplement (GLUCERNA SHAKE)  237 mL Oral BID BM  . heparin  5,000 Units Subcutaneous 3 times per day  . hydrALAZINE  100 mg Oral TID  . insulin aspart  0-9 Units Subcutaneous TID WC  . insulin detemir  30 Units Subcutaneous Daily  . isosorbide mononitrate  60 mg Oral Daily  . levothyroxine  125 mcg Oral QAC breakfast  . pantoprazole  40 mg Oral Daily  . piperacillin-tazobactam (ZOSYN)  IV  2.25 g  Intravenous Q8H  . silver sulfADIAZINE   Topical Daily  . sodium chloride  3 mL Intravenous Q12H   Continuous Infusions: . sodium chloride 10 mL/hr at 09/13/14 0111  . sodium chloride 10 mL/hr at 09/13/14 2007  . sodium chloride 75 mL/hr at 09/17/14 1122   PRN Meds:.sodium chloride, acetaminophen **OR** acetaminophen, albuterol, ALPRAZolam, methocarbamol **OR** methocarbamol (ROBAXIN)  IV, metoCLOPramide **OR** metoCLOPramide (REGLAN) injection, morphine injection, ondansetron **OR** ondansetron (ZOFRAN) IV, oxyCODONE, sodium chloride, zolpidem  Antibiotics    Anti-infectives    Start     Dose/Rate Route Frequency Ordered Stop   09/15/14 1600  piperacillin-tazobactam (ZOSYN) IVPB 2.25 g     2.25 g 100 mL/hr over 30 Minutes Intravenous Every 8 hours 09/15/14 0923     09/14/14 0600  ceFAZolin (ANCEF) IVPB 2 g/50 mL premix  Status:  Discontinued     2 g 100 mL/hr over 30 Minutes Intravenous On call to O.R. 09/13/14 1710 09/13/14 1946   09/13/14 1711  ceFAZolin (ANCEF) 2-3 GM-% IVPB SOLR    CommentsAra Kussmaul   : cabinet override      09/13/14 1711 09/14/14 0529   09/09/14 1000  vancomycin (VANCOCIN) 1,500 mg in sodium chloride 0.9 % 500 mL IVPB  Status:  Discontinued     1,500 mg 250 mL/hr over 120 Minutes Intravenous Every 48 hours 09/07/14 0849 09/15/14 1116   09/07/14 1600  piperacillin-tazobactam (ZOSYN) IVPB 3.375 g  Status:  Discontinued     3.375 g 12.5 mL/hr over 240 Minutes Intravenous Every 8 hours 09/07/14 0849 09/15/14 0923   09/07/14 0900  vancomycin (VANCOCIN) 2,250 mg in sodium chloride 0.9 % 500 mL IVPB     2,250 mg 250 mL/hr over 120 Minutes Intravenous  Once 09/07/14 0820 09/07/14 1141   09/07/14 0900  piperacillin-tazobactam (ZOSYN) IVPB 3.375 g     3.375 g 100 mL/hr over 30 Minutes Intravenous  Once 09/07/14 0849 09/07/14 1011      Subjective:   Chalmers Cater Filosa seen and examined today.  Patient denies chest pain, shortness of breath, abdominal pain,  cough, dizziness, headache.  States he feels fine and is hungry.  Objective:   Filed Vitals:   09/16/14 1659 09/16/14 2101 09/17/14 0503 09/17/14 0935  BP: 140/56 133/43 138/47 141/53  Pulse: 58 58 60 57  Temp: 98.7 F (37.1 C) 98.4 F (36.9 C) 98.9 F (37.2 C) 97.5 F (36.4 C)  TempSrc: Oral   Oral  Resp: 18 18 20 18   Height:      Weight:  105.688 kg (233 lb)    SpO2: 95% 92% 90% 99%    Wt Readings from Last 3 Encounters:  09/16/14 105.688 kg (233  lb)  06/22/14 110.678 kg (244 lb)  06/09/14 112.855 kg (248 lb 12.8 oz)     Intake/Output Summary (Last 24 hours) at 09/17/14 1256 Last data filed at 09/17/14 1122  Gross per 24 hour  Intake 3082.5 ml  Output    100 ml  Net 2982.5 ml    Exam  General: Well developed, well nourished, no distress  HEENT: NCAT, mucous membranes moist.   Cardiovascular: S1 S2 auscultated, RRR, no murmurs  Respiratory: Diminished breath sounds, but clear  Abdomen: Soft, nontender, nondistended, + bowel sounds  Extremities: R AKA, LLE unna boot  Data Review   Micro Results Recent Results (from the past 240 hour(s))  Culture, blood (routine x 2)     Status: None (Preliminary result)   Collection Time: 09/15/14  3:20 PM  Result Value Ref Range Status   Specimen Description BLOOD LEFT ARM  Final   Special Requests BOTTLES DRAWN AEROBIC AND ANAEROBIC 10CC  Final   Culture NO GROWTH < 24 HOURS  Final   Report Status PENDING  Incomplete  Culture, blood (routine x 2)     Status: None (Preliminary result)   Collection Time: 09/15/14  3:30 PM  Result Value Ref Range Status   Specimen Description BLOOD LEFT HAND  Final   Special Requests BOTTLES DRAWN AEROBIC ONLY 3CC  Final   Culture NO GROWTH < 24 HOURS  Final   Report Status PENDING  Incomplete    Radiology Reports Dg Chest 1 View  09/07/2014   CLINICAL DATA:  Fluid overload, history CHF, COPD, hypertension, diabetes mellitus, prostate cancer  EXAM: CHEST  1 VIEW  COMPARISON:   Portable exam 1902 hours compared to 09/06/2014  FINDINGS: Enlargement of cardiac silhouette.  Atherosclerotic calcification aorta.  Mediastinal contour stable.  Diffuse pulmonary infiltrates question edema versus less likely infection, increased versus previous study.  No pleural effusion or pneumothorax.  Sclerotic foci at the proximal LEFT humerus question related to metastatic disease  IMPRESSION: Enlargement of cardiac silhouette with diffuse BILATERAL infiltrates favoring pulmonary edema over infection.  Sclerotic foci at the proximal LEFT humerus cannot exclude sclerotic osseous metastases in this patient with a history of prostate cancer.   Electronically Signed   By: Lavonia Dana M.D.   On: 09/07/2014 20:02   Dg Chest 2 View  09/09/2014   CLINICAL DATA:  81 year old male with fever, cough and congestion  EXAM: CHEST  2 VIEW  COMPARISON:  Prior chest x-ray 09/07/2014  FINDINGS: Overall, there has been some improvement in diffuse bilateral interstitial and airspace opacities. However, there remains some focal airspace opacification within the right mid lung. No pleural effusion. No pneumothorax. Stable cardiomegaly and mediastinal contours. Atherosclerotic calcification remains present within the transverse aorta.  IMPRESSION: 1. Improving pulmonary edema. 2. Residual patchy airspace opacity in the right mid lung in the region of the minor fissure may represent superimposed infiltrate/pneumonia, or perhaps some fluid trapped within the minor fissure. 3. Stable cardiomegaly.   Electronically Signed   By: Jacqulynn Cadet M.D.   On: 09/09/2014 10:21   US Renal  09/17/2014   CLINICAL DATA:  Acute renal insufficiency  EXAM: RENAL / URINARY TRACT ULTRASOUND COMPLETE  COMPARISON:  None.  FINDINGS: Right Kidney:  Length: 10.2 cm. Echogenicity within normal limits. No mass or hydronephrosis visualized.  Left Kidney:  Length: 10.5 cm.  Limited views, negative for hydronephrosis.  Bladder:  Not seen.  IMPRESSION:  Negative for hydronephrosis.  Limited views of the left kidney.   Electronically  Signed   By: Andreas Newport M.D.   On: 09/17/2014 02:44   Nm Pulmonary Perf And Vent  09/08/2014   CLINICAL DATA:  Shortness of breath.  EXAM: NUCLEAR MEDICINE VENTILATION - PERFUSION LUNG SCAN  TECHNIQUE: Ventilation images were obtained in multiple projections using inhaled aerosol Tc-35m DTPA. Perfusion images were obtained in multiple projections after intravenous injection of Tc-43m MAA.  RADIOPHARMACEUTICALS:  39.2 Technetium-74m DTPA aerosol inhalation and 6.45 Technetium-22m MAA IV  COMPARISON:  Chest x-ray 09/07/2014.  FINDINGS: Ventilation: Severe bilateral ventilatory defects. No segmental sized perfusion defects noted.  Perfusion: No wedge shaped peripheral perfusion defects to suggest acute pulmonary embolism.  IMPRESSION: Severe bilateral ventilatory defects consistent with bilateral airspace disease noted on recent chest x-ray. No perfusion defects noted to suggest pulmonary embolus. Low probability scan for pulmonary embolus.   Electronically Signed   By: Marcello Moores  Register   On: 09/08/2014 12:12   Dg Chest Port 1 View  09/15/2014   CLINICAL DATA:  Fever and difficulty breathing  EXAM: PORTABLE CHEST - 1 VIEW  COMPARISON:  September 09, 2014  FINDINGS: There is generalized interstitial edema. There remains slight alveolar consolidation in the mid right lung, less than on recent prior study. Heart is enlarged. The pulmonary vascular is within normal limits. No adenopathy.  IMPRESSION: Findings consistent with congestive heart failure. A small focus of pneumonia in the right mid lung cannot be excluded.   Electronically Signed   By: Lowella Grip III M.D.   On: 09/15/2014 16:53   Dg Chest Portable 1 View  09/06/2014   CLINICAL DATA:  Lower extremity weakness  EXAM: PORTABLE CHEST - 1 VIEW  COMPARISON:  March 10, 2014  FINDINGS: There is scarring in both mid lung regions. There is no frank edema or consolidation.  Heart is mildly enlarged with pulmonary vascular within normal limits. There is atherosclerotic change in aorta. The bony structures appear stable compared to prior study without focal lesion. Several bony structures appear rather sclerotic a particularly in the medial proximal right humerus, incompletely visualized on this study.  IMPRESSION: Areas of lung scarring. No frank edema or consolidation. Several bones appear somewhat sclerotic. This finding may warrant prostate evaluation given the propensity of prostate carcinoma to present with sclerotic bony metastases.   Electronically Signed   By: Lowella Grip III M.D.   On: 09/06/2014 09:20   Dg Foot Complete Right  09/06/2014   CLINICAL DATA:  Right foot pain, wound.  EXAM: RIGHT FOOT COMPLETE - 3+ VIEW  COMPARISON:  None.  FINDINGS: Soft tissue swelling within the right foot. No acute bony abnormality. No acute fracture, subluxation or dislocation. Possible old healed fractures in the proximal second and third metatarsals. No soft tissue gas.  IMPRESSION: No acute bony abnormality.   Electronically Signed   By: Rolm Baptise M.D.   On: 09/06/2014 14:55    CBC  Recent Labs Lab 09/12/14 1055 09/13/14 1720 09/14/14 0520 09/15/14 0531 09/16/14 0502 09/17/14 0523  WBC 16.5* 16.4* 19.6* 18.3* 19.7* 19.4*  HGB 8.9* 9.0* 9.3* 8.2* 8.2* 8.1*  HCT 29.7* 29.7* 30.5* 26.6* 26.2* 26.1*  PLT 412* 415* 441* 435* 471* 492*  MCV 82.5 80.7 82.7 81.1 80.6 81.3  MCH 24.7* 24.5* 25.2* 25.0* 25.2* 25.2*  MCHC 30.0 30.3 30.5 30.8 31.3 31.0  RDW 19.7* 19.6* 20.0* 19.9* 20.2* 20.2*  LYMPHSABS 1.2 0.8  --   --   --   --   MONOABS 0.6 1.1*  --   --   --   --  EOSABS 0.5 0.4  --   --   --   --   BASOSABS 0.0 0.0  --   --   --   --     Chemistries   Recent Labs Lab 09/13/14 1720 09/14/14 0520 09/15/14 0531 09/16/14 0502 09/17/14 0523  NA 143 136 138 139 137  K 3.8 3.8 4.0 3.8 4.0  CL 111 103 104 104 105  CO2 23 19* 23 22 21*  GLUCOSE 130* 183*  181* 172* 117*  BUN 51* 55* 64* 73* 73*  CREATININE 3.21* 3.66* 4.45* 5.08* 4.97*  CALCIUM 8.4* 8.1* 8.2* 8.0* 7.9*  AST 24  --   --   --   --   ALT 22  --   --   --   --   ALKPHOS 120  --   --   --   --   BILITOT 0.6  --   --   --   --    ------------------------------------------------------------------------------------------------------------------ estimated creatinine clearance is 14.6 mL/min (by C-G formula based on Cr of 4.97). ------------------------------------------------------------------------------------------------------------------ No results for input(s): HGBA1C in the last 72 hours. ------------------------------------------------------------------------------------------------------------------ No results for input(s): CHOL, HDL, LDLCALC, TRIG, CHOLHDL, LDLDIRECT in the last 72 hours. ------------------------------------------------------------------------------------------------------------------ No results for input(s): TSH, T4TOTAL, T3FREE, THYROIDAB in the last 72 hours.  Invalid input(s): FREET3 ------------------------------------------------------------------------------------------------------------------ No results for input(s): VITAMINB12, FOLATE, FERRITIN, TIBC, IRON, RETICCTPCT in the last 72 hours.  Coagulation profile  Recent Labs Lab 09/13/14 1720  INR 1.25    No results for input(s): DDIMER in the last 72 hours.  Cardiac Enzymes No results for input(s): CKMB, TROPONINI, MYOGLOBIN in the last 168 hours.  Invalid input(s): CK ------------------------------------------------------------------------------------------------------------------ Invalid input(s): POCBNP    Teofila Bowery D.O. on 09/17/2014 at 12:56 PM  Between 7am to 7pm - Pager - [phone]  After 7pm go to [email or web address] - password [email or web address]  And look for the night coverage person covering for me after hours  [facility] Office  [phone]

## 2014-09-18 ENCOUNTER — Inpatient Hospital Stay (HOSPITAL_COMMUNITY): Payer: Medicare PPO

## 2014-09-18 DIAGNOSIS — D649 Anemia, unspecified: Secondary | ICD-10-CM

## 2014-09-18 DIAGNOSIS — Z515 Encounter for palliative care: Secondary | ICD-10-CM | POA: Insufficient documentation

## 2014-09-18 LAB — BASIC METABOLIC PANEL
ANION GAP: 12 (ref 5–15)
BUN: 79 mg/dL — ABNORMAL HIGH (ref 6–20)
CHLORIDE: 109 mmol/L (ref 101–111)
CO2: 20 mmol/L — AB (ref 22–32)
Calcium: 8 mg/dL — ABNORMAL LOW (ref 8.9–10.3)
Creatinine, Ser: 4.9 mg/dL — ABNORMAL HIGH (ref 0.61–1.24)
GFR calc non Af Amer: 10 mL/min — ABNORMAL LOW (ref >60)
GFR, EST AFRICAN AMERICAN: 12 mL/min — AB (ref >60)
Glucose, Bld: 83 mg/dL (ref 65–99)
Potassium: 4 mmol/L (ref 3.5–5.1)
SODIUM: 141 mmol/L (ref 135–145)

## 2014-09-18 LAB — CBC
HEMATOCRIT: 24.7 % — AB (ref 39.0–52.0)
Hemoglobin: 7.4 g/dL — ABNORMAL LOW (ref 13.0–17.0)
MCH: 24.5 pg — ABNORMAL LOW (ref 26.0–34.0)
MCHC: 30 g/dL (ref 30.0–36.0)
MCV: 81.8 fL (ref 78.0–100.0)
Platelets: 538 10*3/uL — ABNORMAL HIGH (ref 150–400)
RBC: 3.02 MIL/uL — ABNORMAL LOW (ref 4.22–5.81)
RDW: 20.5 % — AB (ref 11.5–15.5)
WBC: 16.4 10*3/uL — ABNORMAL HIGH (ref 4.0–10.5)

## 2014-09-18 LAB — PREPARE RBC (CROSSMATCH)

## 2014-09-18 LAB — GLUCOSE, CAPILLARY
Glucose-Capillary: 99 mg/dL (ref 65–99)
Glucose-Capillary: 115 mg/dL — ABNORMAL HIGH (ref 65–99)
Glucose-Capillary: 98 mg/dL (ref 65–99)
Glucose-Capillary: 84 mg/dL (ref 65–99)

## 2014-09-18 MED ORDER — FUROSEMIDE 10 MG/ML IJ SOLN
80.0000 mg | Freq: Three times a day (TID) | INTRAMUSCULAR | Status: DC
  Administered 2014-09-18: 80 mg via INTRAVENOUS
  Filled 2014-09-18: qty 8

## 2014-09-18 MED ORDER — FUROSEMIDE 10 MG/ML IJ SOLN
120.0000 mg | Freq: Three times a day (TID) | INTRAVENOUS | Status: DC
  Administered 2014-09-18 – 2014-09-21 (×9): 120 mg via INTRAVENOUS
  Filled 2014-09-18 (×10): qty 12

## 2014-09-18 MED ORDER — SODIUM CHLORIDE 0.9 % IV SOLN
Freq: Once | INTRAVENOUS | Status: AC
  Administered 2014-09-18: 16:00:00 via INTRAVENOUS

## 2014-09-18 NOTE — Progress Notes (Signed)
Spoke to [name] with goal to set up family meeting to discuss goals of care. Ms [name] is listed as emergency contact and describes herself as " POA". She politely refused consult. I did explain that this is not a hospice discussion nor am I with hospice services.Discussed that Palliative Care is for anyone regardless of level of care or prognosis who has a potentially life limiting disease to review options important to the pt as well as appropriate symptom mgt. She again is appreciate but declines consult at this time. Will notify attending. If pt's needs change please feel free to re-consult Palliative Care.  Thank you, Romona Curls, ANP-ACHPN

## 2014-09-18 NOTE — Progress Notes (Signed)
Received order to insert foley catheter in order to better assess intake and outpu. Unable to insert foley d/t possible obstruction. Foley could be felt curling under the skin. Bartholomew Crews, RN

## 2014-09-18 NOTE — Progress Notes (Signed)
[facility]                                                                              Patient Demographics  [name], is a 81 y.o. male, DOB - September 26, 1932, YQI:[id number]  Admit date - 09/06/2014   Admitting Physician Oswald Hillock, MD  Outpatient Primary MD for the patient is [name], MD  LOS - 12   Chief Complaint  Patient presents with  . Leg Swelling      HPI on 09/06/2014 by Dr. Eleonore Chiquito 81 yr old male is presenting for worsening chronic edema and difficulty ambulating due to pain. Worsening pain in left and right lower legs has been constantly aching for approximately 1 year and is relieved by rest and worsened by activity. Associated symptoms include fatigue and swelling. He has chronic lower extremity at baseline but it is worsening and family reports the "meat is falling off" from his legs. No fever.  Due to his independent living situation, he has a difficult time caring for himself and (per son-in-law at bedside) has a nurse from [place] making visits to his house twice a day.   Assessment & Plan   Necrotic right foot/leg -Patient has chronic bilateral lower extremity sores, likely due to diabetes mellitus -Orthopedics consultation appreciated -Status post right AKA -Was on vanc and zosyn  -Continue wound care -Patient will likely be discharged to SNF  Post op fever/ Sepsis secondary to possible HCAP -Currently afebrile however had a fever of 101.3 on 09/14/2014 -Leukocytosis imroved (however antibiotics discontinued 09/17/2014) -Blood cultures 09/15/2014 show no growth to date -Strep pneumo and legionella urine antigens neg -Repeat CXR 7/30: suspected multifocal pna in B/L upper lobes and RLL -Patient received vanc and zosyn for 10 days (strange that HCAP would no resolve) -Spoke with ID, Dr. 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 He will follow the patient as outpatient.  Hypothyroid -Continue synthroid 125 mcg  History of prostate cancer-15 yrs ago -stable   Essential hypertension -Stable, continue hydralazine 100 mg tablet, clonidine patch 0.1 mg  Dyslipidemia -continue statin   CAD, moderate -pt denies any chest pain.   Acute on CKD stage 4 -Torsemide discontinued  -Creatinine trending downward slowly 4.90 (despite IVF) -Renal US: negative hydronephrosis -Continue to monitor BMP -Nephrology consulted and appreciated    Obesity  -Body mass index is 32.1 kg/(m^2).  Code Status: DNR   Family Communication: None at bedside  Disposition Plan: Admitted.  Continue to monitor.  Nephrology consulted  Time Spent in minutes   30 minutes  Procedures  Right AKA  Consults   Orthopedic surgery Dr. Linus Salmons, ID, via phone  DVT Prophylaxis  Heparin  Lab Results  Component Value Date   PLT 538* 09/18/2014    Medications  Scheduled Meds: . allopurinol  100 mg Oral QHS  . amiodarone  100 mg Oral Daily  . amLODipine  10 mg Oral Daily  . aspirin EC  81 mg Oral Daily  . atorvastatin  40 mg Oral q1800  . citalopram  20 mg Oral Daily  . feeding supplement (GLUCERNA SHAKE)  237 mL Oral BID BM  . heparin  5,000 Units Subcutaneous 3 times per day  . hydrALAZINE  100 mg Oral TID  . insulin aspart  0-9 Units Subcutaneous TID WC  . insulin detemir  30 Units Subcutaneous Daily  . isosorbide mononitrate  60 mg Oral Daily  . levothyroxine  125 mcg Oral QAC breakfast  .  pantoprazole  40 mg Oral Daily  . silver sulfADIAZINE   Topical Daily  . sodium chloride  3 mL Intravenous Q12H   Continuous Infusions: . sodium chloride 10 mL/hr at 09/13/14 0111  . sodium chloride 10 mL/hr at 09/13/14 2007   PRN Meds:.sodium chloride, acetaminophen **OR** acetaminophen, albuterol, ALPRAZolam, methocarbamol **OR** methocarbamol (ROBAXIN)  IV, metoCLOPramide **OR** metoCLOPramide (REGLAN) injection, morphine injection, ondansetron **OR** ondansetron (ZOFRAN) IV, oxyCODONE, sodium chloride, zolpidem  Antibiotics    Anti-infectives    Start     Dose/Rate Route Frequency Ordered Stop   09/15/14 1600  piperacillin-tazobactam (ZOSYN) IVPB 2.25 g  Status:  Discontinued     2.25 g 100 mL/hr over 30 Minutes Intravenous Every 8 hours 09/15/14 0923 09/17/14 1258   09/14/14 0600  ceFAZolin (ANCEF) IVPB 2 g/50 mL premix  Status:  Discontinued     2 g 100 mL/hr over 30 Minutes Intravenous On call to O.R. 09/13/14 1710 09/13/14 1946   09/13/14 1711  ceFAZolin (ANCEF) 2-3 GM-% IVPB SOLR    CommentsAra Kussmaul   : cabinet override      09/13/14 1711 09/14/14 0529   09/09/14 1000  vancomycin (VANCOCIN) 1,500 mg in sodium chloride 0.9 % 500 mL IVPB  Status:  Discontinued     1,500 mg 250 mL/hr over 120 Minutes Intravenous Every 48 hours 09/07/14 0849 09/15/14 1116   09/07/14 1600  piperacillin-tazobactam (ZOSYN) IVPB 3.375 g  Status:  Discontinued     3.375 g 12.5 mL/hr over 240 Minutes Intravenous Every 8 hours 09/07/14 0849 09/15/14 0923   09/07/14 0900  vancomycin (VANCOCIN) 2,250 mg in sodium chloride 0.9 % 500 mL IVPB     2,250 mg 250 mL/hr over 120 Minutes Intravenous  Once 09/07/14 0820 09/07/14 1141   09/07/14 0900  piperacillin-tazobactam (ZOSYN) IVPB 3.375 g     3.375 g 100 mL/hr over 30 Minutes Intravenous  Once 09/07/14 0849 09/07/14 1011      Subjective:   [name] seen and examined today.  Patient denies chest pain, shortness of breath, abdominal  pain, cough, dizziness, headache.  Continues to moan.   Objective:   Filed Vitals:   09/17/14 1802 09/17/14 2120 09/18/14 0534 09/18/14 0810  BP:  117/50 140/49 130/53  Pulse:  116 58 69  Temp: 98.2 F (36.8 C) 98 F (36.7 C) 98.2 F (36.8 C) 98.8 F (37.1 C)  TempSrc: Oral Axillary Oral Oral  Resp:  20 18 22   Height:      Weight:  113.246 kg (249 lb 10.6 oz)    SpO2:  91% 90% 92%    Wt Readings from Last 3 Encounters:  09/17/14 113.246 kg (249 lb 10.6 oz)  06/22/14 110.678 kg (244 lb)  06/09/14 112.855 kg (248 lb 12.8 oz)     Intake/Output Summary (Last 24 hours) at 09/18/14 0931 Last data filed at 09/18/14 0600  Gross per 24 hour  Intake   2687 ml  Output    550 ml  Net   2137 ml    Exam  General: Well developed, well nourished, no distress  HEENT: NCAT, mucous membranes moist.   Cardiovascular: S1 S2 auscultated, RRR, no murmurs  Respiratory: Diminished breath sounds, but clear  Abdomen: Soft, nontender, nondistended, + bowel sounds  Extremities: R AKA, LLE unna boot  Data Review   Micro Results Recent Results (from the past 240 hour(s))  Culture, blood (routine x 2)     Status: None (Preliminary result)   Collection Time: 09/15/14  3:20 PM  Result Value Ref Range Status   Specimen Description BLOOD LEFT ARM  Final   Special Requests BOTTLES DRAWN AEROBIC AND ANAEROBIC 10CC  Final   Culture NO GROWTH 2 DAYS  Final   Report Status PENDING  Incomplete  Culture, blood (routine x 2)     Status: None (Preliminary result)   Collection Time: 09/15/14  3:30 PM  Result Value Ref Range Status   Specimen Description BLOOD LEFT HAND  Final   Special Requests BOTTLES DRAWN AEROBIC ONLY 3CC  Final   Culture NO GROWTH 2 DAYS  Final   Report Status PENDING  Incomplete  Culture, Urine     Status: None   Collection Time: 09/16/14  1:44 AM  Result Value Ref Range Status   Specimen Description URINE, RANDOM  Final   Special Requests NONE  Final   Culture 30,000  COLONIES/mL YEAST  Final   Report Status 09/17/2014 FINAL  Final    Radiology Reports Dg Chest 1 View  09/07/2014   CLINICAL DATA:  Fluid overload, history CHF, COPD, hypertension, diabetes mellitus, prostate cancer  EXAM: CHEST  1 VIEW  COMPARISON:  Portable exam 1902 hours compared to 09/06/2014  FINDINGS: Enlargement of cardiac silhouette.  Atherosclerotic calcification aorta.  Mediastinal contour stable.  Diffuse pulmonary infiltrates question edema versus less likely infection, increased versus previous study.  No pleural effusion or pneumothorax.  Sclerotic foci at the proximal LEFT humerus question related to metastatic disease  IMPRESSION: Enlargement of cardiac silhouette with diffuse BILATERAL infiltrates favoring pulmonary edema over infection.  Sclerotic foci at the proximal LEFT humerus cannot exclude sclerotic osseous metastases in this patient with a history of prostate cancer.   Electronically Signed   By: Lavonia Dana M.D.   On: 09/07/2014 20:02   Dg Chest 2 View  09/09/2014   CLINICAL DATA:  81 year old male with fever, cough and congestion  EXAM: CHEST  2 VIEW  COMPARISON:  Prior chest x-ray 09/07/2014  FINDINGS: Overall, there has been some improvement in diffuse bilateral interstitial and airspace opacities. However, there remains some focal airspace opacification within the right mid lung. No pleural effusion. No pneumothorax. Stable cardiomegaly and mediastinal contours. Atherosclerotic calcification remains present within the transverse aorta.  IMPRESSION: 1. Improving pulmonary edema. 2. Residual patchy airspace opacity in the right mid lung in the region of the minor fissure may represent superimposed infiltrate/pneumonia, or perhaps some fluid trapped within the minor fissure. 3. Stable cardiomegaly.   Electronically Signed   By: Jacqulynn Cadet M.D.   On: 09/09/2014 10:21   US Renal  09/17/2014   CLINICAL DATA:  Acute renal insufficiency  EXAM: RENAL / URINARY TRACT  ULTRASOUND COMPLETE  COMPARISON:  None.  FINDINGS: Right Kidney:  Length: 10.2 cm. Echogenicity within normal limits. No mass or hydronephrosis visualized.  Left Kidney:  Length: 10.5 cm.  Limited views, negative for hydronephrosis.  Bladder:  Not seen.  IMPRESSION: Negative for hydronephrosis.  Limited views of the left kidney.   Electronically Signed   By: Andreas Newport M.D.   On: 09/17/2014 02:44   Nm Pulmonary Perf And Vent  09/08/2014   CLINICAL DATA:  Shortness of breath.  EXAM: NUCLEAR MEDICINE VENTILATION - PERFUSION LUNG SCAN  TECHNIQUE: Ventilation images were obtained in multiple projections using inhaled aerosol Tc-62m DTPA. Perfusion images were obtained in multiple projections after intravenous injection of Tc-12m MAA.  RADIOPHARMACEUTICALS:  39.2 Technetium-45m DTPA aerosol inhalation and 6.45 Technetium-61m MAA IV  COMPARISON:  Chest x-ray 09/07/2014.  FINDINGS: Ventilation: Severe bilateral ventilatory defects. No segmental sized perfusion defects noted.  Perfusion: No wedge shaped peripheral perfusion defects to suggest acute pulmonary embolism.  IMPRESSION: Severe bilateral ventilatory defects consistent with bilateral airspace disease noted on recent chest x-ray. No perfusion defects noted to suggest pulmonary embolus. Low probability scan for pulmonary embolus.   Electronically Signed   By: Marcello Moores  Register   On: 09/08/2014 12:12   Dg Chest Port 1 View  09/18/2014   CLINICAL DATA:  Leukocytosis  EXAM: PORTABLE CHEST - 1 VIEW  COMPARISON:  09/15/2014  FINDINGS: Multifocal patchy opacities in the right upper and lower lobes and left prior hilar region, suspicious for multifocal pneumonia, less likely interstitial edema. No definite pleural effusions. No pneumothorax.  The heart is top-normal in size.  IMPRESSION: Suspected multifocal pneumonia in the bilateral upper lobes and right lower lobe, grossly unchanged.   Electronically Signed   By: Julian Hy M.D.   On: 09/18/2014 08:46    Dg Chest Port 1 View  09/15/2014   CLINICAL DATA:  Fever and difficulty breathing  EXAM: PORTABLE CHEST - 1 VIEW  COMPARISON:  September 09, 2014  FINDINGS: There is generalized interstitial edema. There remains slight alveolar consolidation in the mid right lung, less than on recent prior study. Heart is enlarged. The pulmonary vascular is within normal limits. No adenopathy.  IMPRESSION: Findings consistent with congestive heart failure. A small focus of pneumonia in the right mid lung cannot be excluded.   Electronically Signed   By: Lowella Grip III M.D.   On: 09/15/2014 16:53   Dg Chest Portable 1 View  09/06/2014   CLINICAL DATA:  Lower extremity weakness  EXAM: PORTABLE CHEST - 1 VIEW  COMPARISON:  March 10, 2014  FINDINGS: There is scarring in both mid lung regions. There is no frank edema or consolidation. Heart is mildly enlarged with pulmonary vascular within normal limits. There is atherosclerotic change in aorta. The bony structures appear stable compared to prior study without focal lesion. Several bony structures appear rather sclerotic a particularly in the medial proximal right humerus, incompletely visualized on this study.  IMPRESSION: Areas of lung scarring. No frank edema or consolidation. Several bones appear somewhat sclerotic. This finding may warrant prostate evaluation given the propensity of prostate carcinoma to present with sclerotic bony metastases.   Electronically Signed   By: Lowella Grip III M.D.   On: 09/06/2014 09:20   Dg Foot Complete Right  09/06/2014   CLINICAL DATA:  Right foot pain, wound.  EXAM: RIGHT FOOT COMPLETE - 3+ VIEW  COMPARISON:  None.  FINDINGS: Soft tissue swelling within the right foot. No acute bony abnormality. No acute fracture,  subluxation or dislocation. Possible old healed fractures in the proximal second and third metatarsals. No soft tissue gas.  IMPRESSION: No acute bony abnormality.   Electronically Signed   By: Rolm Baptise M.D.   On:  09/06/2014 14:55    CBC  Recent Labs Lab 09/12/14 1055 09/13/14 1720 09/14/14 0520 09/15/14 0531 09/16/14 0502 09/17/14 0523 09/18/14 0453  WBC 16.5* 16.4* 19.6* 18.3* 19.7* 19.4* 16.4*  HGB 8.9* 9.0* 9.3* 8.2* 8.2* 8.1* 7.4*  HCT 29.7* 29.7* 30.5* 26.6* 26.2* 26.1* 24.7*  PLT 412* 415* 441* 435* 471* 492* 538*  MCV 82.5 80.7 82.7 81.1 80.6 81.3 81.8  MCH 24.7* 24.5* 25.2* 25.0* 25.2* 25.2* 24.5*  MCHC 30.0 30.3 30.5 30.8 31.3 31.0 30.0  RDW 19.7* 19.6* 20.0* 19.9* 20.2* 20.2* 20.5*  LYMPHSABS 1.2 0.8  --   --   --   --   --   MONOABS 0.6 1.1*  --   --   --   --   --   EOSABS 0.5 0.4  --   --   --   --   --   BASOSABS 0.0 0.0  --   --   --   --   --     Chemistries   Recent Labs Lab 09/13/14 1720 09/14/14 0520 09/15/14 0531 09/16/14 0502 09/17/14 0523 09/18/14 0453  NA 143 136 138 139 137 141  K 3.8 3.8 4.0 3.8 4.0 4.0  CL 111 103 104 104 105 109  CO2 23 19* 23 22 21* 20*  GLUCOSE 130* 183* 181* 172* 117* 83  BUN 51* 55* 64* 73* 73* 79*  CREATININE 3.21* 3.66* 4.45* 5.08* 4.97* 4.90*  CALCIUM 8.4* 8.1* 8.2* 8.0* 7.9* 8.0*  AST 24  --   --   --   --   --   ALT 22  --   --   --   --   --   ALKPHOS 120  --   --   --   --   --   BILITOT 0.6  --   --   --   --   --    ------------------------------------------------------------------------------------------------------------------ estimated creatinine clearance is 15.3 mL/min (by C-G formula based on Cr of 4.9). ------------------------------------------------------------------------------------------------------------------ No results for input(s): HGBA1C in the last 72 hours. ------------------------------------------------------------------------------------------------------------------ No results for input(s): CHOL, HDL, LDLCALC, TRIG, CHOLHDL, LDLDIRECT in the last 72 hours. ------------------------------------------------------------------------------------------------------------------ No results for  input(s): TSH, T4TOTAL, T3FREE, THYROIDAB in the last 72 hours.  Invalid input(s): FREET3 ------------------------------------------------------------------------------------------------------------------ No results for input(s): VITAMINB12, FOLATE, FERRITIN, TIBC, IRON, RETICCTPCT in the last 72 hours.  Coagulation profile  Recent Labs Lab 09/13/14 1720  INR 1.25    No results for input(s): DDIMER in the last 72 hours.  Cardiac Enzymes No results for input(s): CKMB, TROPONINI, MYOGLOBIN in the last 168 hours.  Invalid input(s): CK ------------------------------------------------------------------------------------------------------------------ Invalid input(s): POCBNP    Rivaldo Hineman D.O. on 09/18/2014 at 9:31 AM  Between 7am to 7pm - Pager - [phone]  After 7pm go to [email or web address] - password [email or web address]  And look for the night coverage person covering for me after hours  [facility] Office  [phone]

## 2014-09-18 NOTE — Consult Note (Signed)
Renal Service Consult Note [name] - [facility]  [name] 09/18/2014 [name] Requesting Physician:  Dr [name]  Reason for Consult:  Acute on CRF HPI: The patient is a 81 y.o. year-old with hx of diast HF, afib, CVA, HTN, BPH, CKD, obesity, CAD, COPD, remote prostate cancer.  He has known CKD followed by nephrologists locally with baseline creatinine around 2.5- 3.0.  Patient was admitted 7/18 with chronic LE edema, difficulty ambulating due to pain, LE pains x 1 years, fatigue and inability to care for himself at home. He has chronic foot ulcers treated over the years at the [facility].  He has DM2. He was seen by orthopedics on 7/25 for worsening bilat foot ulcers with necrotic drainage of the right foot. No palpable pulses. They recommended amputation which her underwent on the right side on 7/25 with R BKA. They said he very well may require L BKA as well.  Post op his creatinine has been rising up from 3.0 to about 4.8- 5.0 the last 3 days. Asked to see for acute on CRF.  CXR shows pulm edema bilat moderately severe.    Patient is confused to recent events but knows the place but not the year.  No complaints. His daughter says that for about the last year he has been very sick , getting frequent blood transfusions (for MDS/ anemia) and has been talking a lot about dying over the last year or so and that he is "ready to go".    Currently patient doesn't provide any history.    Past Medical History  Past Medical History  Diagnosis Date  . Chronic diastolic heart failure     secondary to diastolic dysfunction EF (previous EF 35-45%)ef 60%4/09  . Arrhythmia     atrial fibrillation /pt on amiodarone,thought to be poor coumadin  . Stroke   . Other and unspecified hyperlipidemia   . Hypertension   . Hypertrophy of prostate without urinary obstruction and other lower urinary tract symptoms (LUTS)   . Osteoarthrosis, unspecified whether generalized or localized, unspecified site    . Type II or unspecified type diabetes mellitus without mention of complication, not stated as uncontrolled   . AV block, [place] 1     Intolerance to ACE's / discontiuation of beta blockers  . Obstructive sleep apnea   . Iron deficiency anemia, unspecified     s/p EGD and coloscopy 3/10.gastritis.hemorrhids  . Cerebrovascular accident   . Renal insufficiency   . Obesity   . Allergic rhinitis, cause unspecified 05/29/2010  . CAD (coronary artery disease)     cath 12/04: mLAD 50-60%, mCFX 20-30%, pRCA 50-60%, mRCA 40%  . Normochromic normocytic anemia 04/21/2013  . Abnormal CXR 04/21/2013  . Hypertensive kidney disease with CKD stage III 04/21/2013  . Prostate cancer   . CHF (congestive heart failure)   . COPD (chronic obstructive pulmonary disease)   . Shortness of breath dyspnea   . Pneumonia    Past Surgical History  Past Surgical History  Procedure Laterality Date  . Prostate surgery    . Prostate surgury      hx of  . Esophagogastroduodenoscopy N/A 04/21/2013    Procedure: ESOPHAGOGASTRODUODENOSCOPY (EGD);  Surgeon: Irene Shipper, MD;  Location: [facility];  Service: Endoscopy;  Laterality: N/A;  . Amputation Right 09/13/2014    Procedure: AMPUTATION ABOVE KNEE;  Surgeon: Newt Minion, MD;  Location: [place];  Service: Orthopedics;  Laterality: Right;   Family History  Family History  Problem Relation  Age of Onset  . Heart disease Father   . Dementia Mother   . CAD Sister    Social History  reports that he has quit smoking. He has never used smokeless tobacco. He reports that he does not drink alcohol or use illicit drugs. Allergies  Allergies  Allergen Reactions  . Ace Inhibitors Other (See Comments)    HYPOTENSION  . Beta Adrenergic Blockers Other (See Comments)     use cautiously secondary to 2nd degree heart block, hypotension   Home medications Prior to Admission medications   Medication Sig Start Date End Date Taking? Authorizing Provider  ACCU-CHEK AVIVA PLUS test  strip USE AS DIRECTED TWICE DAILY TO CHECK BLOOD SUGAR 08/17/14  Yes Biagio Borg, MD  allopurinol (ZYLOPRIM) 100 MG tablet Take 1 tablet (100 mg total) by mouth at bedtime. 12/30/13  Yes Biagio Borg, MD  amiodarone (PACERONE) 200 MG tablet Take 0.5 tablets (100 mg total) by mouth every morning. 03/19/14  Yes Shaune Pascal Bensimhon, MD  amLODipine (NORVASC) 10 MG tablet TAKE 1 TABLET BY MOUTH EVERY DAY 06/21/14  Yes Jolaine Artist, MD  aspirin EC 81 MG EC tablet Take 1 tablet (81 mg total) by mouth daily. 03/13/14  Yes Brett Canales, PA-C  atorvastatin (LIPITOR) 40 MG tablet TAKE 1 TABLET BY MOUTH EVERY DAY 08/02/14  Yes Jolaine Artist, MD  B-D ULTRAFINE III SHORT PEN 31G X 8 MM MISC  03/14/14  Yes Historical Provider, MD  citalopram (CELEXA) 10 MG tablet Take 20 mg by mouth daily.  07/29/12  Yes Biagio Borg, MD  cloNIDine (CATAPRES - DOSED IN MG/24 HR) 0.1 mg/24hr patch Place 1 patch (0.1 mg total) onto the skin once a week. 03/23/14  Yes Jolaine Artist, MD  diclofenac sodium (VOLTAREN) 1 % GEL Apply 2 g topically 2 (two) times daily. To affected joint. 07/13/14  Yes Lyndal Pulley, DO  Fluticasone-Salmeterol (ADVAIR) 100-50 MCG/DOSE AEPB Inhale 1 puff into the lungs as needed (for shortness of breath).  06/05/12  Yes Renato Shin, MD  glucose blood (ONE TOUCH ULTRA TEST) test strip 1 each by Other route 3 (three) times daily. Use as directed three times daily to check blood sugar.  Diagnosis code E11.29 03/02/14  Yes Biagio Borg, MD  hydrALAZINE (APRESOLINE) 100 MG tablet Take 100 mg by mouth 3 (three) times daily. 01/13/14  Yes Historical Provider, MD  Insulin Detemir (LEVEMIR) 100 UNIT/ML Pen Inject 60 Units into the skin 2 (two) times daily. Patient taking differently: Inject 120 Units into the skin every morning.  08/05/14  Yes Biagio Borg, MD  isosorbide mononitrate (IMDUR) 60 MG 24 hr tablet TAKE 1 TABLET (60 MG TOTAL) BY MOUTH DAILY. 08/24/14  Yes Jolaine Artist, MD  levothyroxine  (SYNTHROID, LEVOTHROID) 125 MCG tablet Take 125 mcg by mouth every morning. 12/26/13  Yes Historical Provider, MD  neomycin-bacitracin-polymyxin (NEOSPORIN) OINT Apply 1 application topically daily.   Yes Historical Provider, MD  omeprazole (PRILOSEC) 20 MG capsule Take 1 capsule (20 mg total) by mouth daily. 04/05/14  Yes Jolaine Artist, MD  potassium chloride SA (K-DUR,KLOR-CON) 20 MEQ tablet Take 1 tablet (20 mEq total) by mouth 2 (two) times daily. Patient taking differently: Take 20 mEq by mouth daily. Just takes in the morning 03/25/14  Yes Amy D Clegg, NP  silver sulfADIAZINE (SILVADENE) 1 % cream Apply topically daily. Topical, Daily, For 21 days Apply to Bilateral LE ulcerations twice daily for three days (  Wednesday through Friday) then decrease dressing changes to daily and continue for 21 days. Patient taking differently: Apply topically daily. Apply daily as needed 10/26/13  Yes Modena Jansky, MD  torsemide (DEMADEX) 20 MG tablet Take 2 tablets (40 mg total) by mouth 2 (two) times daily. 05/03/14  Yes Amy D Clegg, NP  traMADol (ULTRAM) 50 MG tablet Take 1 tablet (50 mg total) by mouth every 8 (eight) hours as needed. 06/22/14  Yes Biagio Borg, MD  TRIGELS-F FORTE 460-60-0.01-1 MG CAPS capsule TAKE 1 CAPSULE BY MOUTH DAILY. 02/22/14  Yes Biagio Borg, MD  albuterol (PROVENTIL) (2.5 MG/3ML) 0.083% nebulizer solution Take 3 mLs (2.5 mg total) by nebulization every 4 (four) hours as needed for wheezing or shortness of breath. 09/15/14   Maryann Mikhail, DO  ALPRAZolam (XANAX) 0.25 MG tablet Take 1 tablet (0.25 mg total) by mouth 3 (three) times daily as needed for anxiety. 09/15/14   Maryann Mikhail, DO  B-D ULTRAFINE III SHORT PEN 31G X 8 MM MISC USE AS DIRECTED EVERY MORNING 09/17/14   Biagio Borg, MD  feeding supplement, GLUCERNA SHAKE, (GLUCERNA SHAKE) LIQD Take 237 mLs by mouth 2 (two) times daily between meals. 09/15/14   Maryann Mikhail, DO  oxyCODONE (OXY IR/ROXICODONE) 5 MG immediate  release tablet Take 1 tablet (5 mg total) by mouth every 3 (three) hours as needed for moderate pain. 09/15/14   Maryann Mikhail, DO  silver sulfADIAZINE (SILVADENE) 1 % cream Apply topically daily. 09/15/14   Cristal Ford, DO   Liver Function Tests  Recent Labs Lab 09/13/14 1720  AST 24  ALT 22  ALKPHOS 120  BILITOT 0.6  PROT 6.5  ALBUMIN 1.8*   No results for input(s): LIPASE, AMYLASE in the last 168 hours. CBC  Recent Labs Lab 09/12/14 1055 09/13/14 1720  09/16/14 0502 09/17/14 0523 09/18/14 0453  WBC 16.5* 16.4*  < > 19.7* 19.4* 16.4*  NEUTROABS 14.2* 14.1*  --   --   --   --   HGB 8.9* 9.0*  < > 8.2* 8.1* 7.4*  HCT 29.7* 29.7*  < > 26.2* 26.1* 24.7*  MCV 82.5 80.7  < > 80.6 81.3 81.8  PLT 412* 415*  < > 471* 492* 538*  < > = values in this interval not displayed. Basic Metabolic Panel  Recent Labs Lab 09/12/14 1055 09/13/14 1720 09/14/14 0520 09/15/14 0531 09/16/14 0502 09/17/14 0523 09/18/14 0453  NA 139 143 136 138 139 137 141  K 3.5 3.8 3.8 4.0 3.8 4.0 4.0  CL 106 111 103 104 104 105 109  CO2 25 23 19* 23 22 21* 20*  GLUCOSE 155* 130* 183* 181* 172* 117* 83  BUN 53* 51* 55* 64* 73* 73* 79*  CREATININE 2.96* 3.21* 3.66* 4.45* 5.08* 4.97* 4.90*  CALCIUM 8.2* 8.4* 8.1* 8.2* 8.0* 7.9* 8.0*    Filed Vitals:   09/17/14 1802 09/17/14 2120 09/18/14 0534 09/18/14 0810  BP:  117/50 140/49 130/53  Pulse:  116 58 69  Temp: 98.2 F (36.8 C) 98 F (36.7 C) 98.2 F (36.8 C) 98.8 F (37.1 C)  TempSrc: Oral Axillary Oral Oral  Resp:  20 18 22   Height:      Weight:  113.246 kg (249 lb 10.6 oz)    SpO2:  91% 90% 92%   Exam Awake, confused, partially oriented, no resp distress No rash, cyanosis or gangrene Sclera anicteric, throat clear +jvd Chest mostly clear bilat Irreg no MRG Abd quite obese, large  pannus, distended, tympanitic, no ascites grossly Bilat mild pitting hip edema R AKA stump wrapped L lower leg wrapped in dressing, no sig edema Neuro  mild asterixis, Ox 2, nonfocal, slurred speech  CXR 7/30 - persistent bilat vasc congestion and diffuse IS edema, unchanged from last film   Assessment: 1. Acute on CRF - in setting of advanced stage IV CKD with decompensated CHF.  Still has pulm edema on CXR . Very poor prognosis.  Poor candidate for long-term dialysis, do not recommend dialysis.  Will ask palliative care to see.  If renal function doesn't improve it will get worse and comfort care would be indicated. I have discussed this all at length with pt's daughter.  Would resume diuretics for now.  2. CKD stage IV due to DM/HTN 3. DM  4. HTN on hydral/ norvasc 5. Hx CVA 6. Bilat foot wounds s/p R AKA: prob will require LLE amp as well 7. COPD 8. MDS getting recurrent tranfusions over the last year 9. DNR   Plan- as above  [name] (pgr) [phone]    ([phone] 09/18/2014, 12:22 PM

## 2014-09-19 DIAGNOSIS — I509 Heart failure, unspecified: Secondary | ICD-10-CM

## 2014-09-19 LAB — CBC
HCT: 29 % — ABNORMAL LOW (ref 39.0–52.0)
Hemoglobin: 8.9 g/dL — ABNORMAL LOW (ref 13.0–17.0)
MCH: 25.7 pg — ABNORMAL LOW (ref 26.0–34.0)
MCHC: 30.7 g/dL (ref 30.0–36.0)
MCV: 83.8 fL (ref 78.0–100.0)
PLATELETS: 529 10*3/uL — AB (ref 150–400)
RBC: 3.46 MIL/uL — AB (ref 4.22–5.81)
RDW: 20.5 % — AB (ref 11.5–15.5)
WBC: 18.1 10*3/uL — AB (ref 4.0–10.5)

## 2014-09-19 LAB — GLUCOSE, CAPILLARY
GLUCOSE-CAPILLARY: 96 mg/dL (ref 65–99)
GLUCOSE-CAPILLARY: 82 mg/dL (ref 65–99)
Glucose-Capillary: 96 mg/dL (ref 65–99)
Glucose-Capillary: 88 mg/dL (ref 65–99)

## 2014-09-19 LAB — BASIC METABOLIC PANEL
Anion gap: 16 — ABNORMAL HIGH (ref 5–15)
BUN: 84 mg/dL — ABNORMAL HIGH (ref 6–20)
CALCIUM: 8.4 mg/dL — AB (ref 8.9–10.3)
CO2: 19 mmol/L — AB (ref 22–32)
CREATININE: 4.68 mg/dL — AB (ref 0.61–1.24)
Chloride: 106 mmol/L (ref 101–111)
GFR calc non Af Amer: 11 mL/min — ABNORMAL LOW (ref >60)
GFR, EST AFRICAN AMERICAN: 12 mL/min — AB (ref >60)
Glucose, Bld: 77 mg/dL (ref 65–99)
Potassium: 4 mmol/L (ref 3.5–5.1)
Sodium: 141 mmol/L (ref 135–145)

## 2014-09-19 LAB — TYPE AND SCREEN
ABO/RH(D): O POS
Antibody Screen: NEGATIVE
Donor AG Type: NEGATIVE
UNIT DIVISION: 0

## 2014-09-19 NOTE — Progress Notes (Signed)
Above noted, plan is for transition to hospice care.  No dialysis. Will sign off.   [name] (pgr) [phone]    ([phone] 09/19/2014, 5:54 PM

## 2014-09-19 NOTE — Progress Notes (Addendum)
Attempted to insert foley catheter. Resistance and obstruction noted. Day shift was unable to insert d/t same issue. Patient voiding with incontinent episodes. Notified on call. Orders received for Coude Catheter to be inserted.  6N unable to place. Will notify on call and report to oncoming shift.

## 2014-09-19 NOTE — Progress Notes (Signed)
[name] Progress Note   Subjective: incontinent of urine, not voiding in large amts per staff.  Creat unchanged today. The family that is here  (oldest brother, and daughter [name]) say they want comfort care and dc to hospice house.  There is another daughter who claims HCPOA and wants aggressive Rx including dialysis but has not produced paperwork yet.       Filed Vitals:   09/18/14 1911 09/18/14 2129 09/19/14 0618 09/19/14 0748  BP: 127/52 134/47 152/57 138/51  Pulse: 54 57 57 60  Temp: 98.9 F (37.2 C) 97.7 F (36.5 C) 97.5 F (36.4 C) 98.6 F (37 C)  TempSrc: Oral Oral Oral Oral  Resp: 20 18 20 19   Height:      Weight:      SpO2:  92% 92% 96%   Exam: Awake, confused, partially oriented, no resp distress No rash, cyanosis or gangrene Sclera anicteric, throat clear +jvd Chest mostly clear bilat Irreg no MRG Abd quite obese, large pannus, distended, tympanitic, no ascites grossly Bilat mild pitting hip edema R AKA stump wrapped L lower leg wrapped in dressing, no sig edema Neuro mild asterixis, Ox 2, nonfocal, slurred speech  CXR 7/30 - persistent bilat vasc congestion and diffuse IS edema, unchanged from last film   Assessment: 1. Acute on CRF - in setting of advanced stage IV CKD with decompensated CHF. Still has pulm edema on CXR and LE edema not responding to IV lasix overnight. Stable resp status howevere. Family are in disagreement about level of aggressiveness of Rx. Not clear who has POA and we are waiting for paperwork about HCPOA.  Family that are here now do not want dialysis. Will wait to hear.  Continue lasix, fluid restriction for now.  2. CKD stage IV due to DM/HTN 3. DM  4. HTN on hydral/ norvasc 5. Hx CVA 6. Bilat foot wounds s/p R AKA: prob will require LLE amp as well 7. COPD 8. MDS getting recurrent tranfusions over the last year 9. DNR  Plan - as above   [name]  pager [phone]    cell [phone]  09/19/2014,  12:46 PM     Recent Labs Lab 09/17/14 0523 09/18/14 0453 09/19/14 0504  NA 137 141 141  K 4.0 4.0 4.0  CL 105 109 106  CO2 21* 20* 19*  GLUCOSE 117* 83 77  BUN 73* 79* 84*  CREATININE 4.97* 4.90* 4.68*  CALCIUM 7.9* 8.0* 8.4*    Recent Labs Lab 09/13/14 1720  AST 24  ALT 22  ALKPHOS 120  BILITOT 0.6  PROT 6.5  ALBUMIN 1.8*    Recent Labs Lab 09/13/14 1720  09/17/14 0523 09/18/14 0453 09/19/14 0504  WBC 16.4*  < > 19.4* 16.4* 18.1*  NEUTROABS 14.1*  --   --   --   --   HGB 9.0*  < > 8.1* 7.4* 8.9*  HCT 29.7*  < > 26.1* 24.7* 29.0*  MCV 80.7  < > 81.3 81.8 83.8  PLT 415*  < > 492* 538* 529*  < > = values in this interval not displayed. Marland Kitchen allopurinol  100 mg Oral QHS  . amiodarone  100 mg Oral Daily  . amLODipine  10 mg Oral Daily  . aspirin EC  81 mg Oral Daily  . atorvastatin  40 mg Oral q1800  . citalopram  20 mg Oral Daily  . feeding supplement (GLUCERNA SHAKE)  237 mL Oral BID BM  . furosemide  120 mg  Intravenous 3 times per day  . heparin  5,000 Units Subcutaneous 3 times per day  . hydrALAZINE  100 mg Oral TID  . insulin aspart  0-9 Units Subcutaneous TID WC  . insulin detemir  30 Units Subcutaneous Daily  . isosorbide mononitrate  60 mg Oral Daily  . levothyroxine  125 mcg Oral QAC breakfast  . pantoprazole  40 mg Oral Daily  . silver sulfADIAZINE   Topical Daily  . sodium chloride  3 mL Intravenous Q12H   . sodium chloride 10 mL/hr at 09/13/14 0111  . sodium chloride 10 mL/hr at 09/13/14 2007   sodium chloride, acetaminophen **OR** acetaminophen, albuterol, ALPRAZolam, methocarbamol **OR** methocarbamol (ROBAXIN)  IV, metoCLOPramide **OR** metoCLOPramide (REGLAN) injection, morphine injection, ondansetron **OR** ondansetron (ZOFRAN) IV, oxyCODONE, sodium chloride, zolpidem

## 2014-09-19 NOTE — Progress Notes (Signed)
Received several phone calls from daughter [name]. She reported that she does have health care POA. I requested that she bring a copy of that to the unit. She initially stated she wanted to go forward with dialysis and would want no further palliative care meetings. I did notify Dr.'s Ree Kida and Doctor, hospital of this. Then later Ms. Ardelia Mems did call again and stated she asked her father would he want dialysis and she felt that he clearly told her "no". She states she wants to pursue in-patient hospice. Notified Dr. Ree Kida. She also called Ms Ardelia Mems to verify her decision which she did. I will place SW referral for evaluation for in-pt hospice  Romona Curls, ANP-ACHPN
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
## 2014-09-19 NOTE — Progress Notes (Signed)
Patient discussed with Dr. Barbaraann Rondo.  Patient has multiple medical issues and he is not stable for d/c this weekend. CSW services will continue to monitor and assist once stable to [facility].  Lorie Phenix. Pauline Good, [place]

## 2014-09-19 NOTE — Progress Notes (Addendum)
[facility]                                                                              Patient Demographics  [name], is a 81 y.o. male, DOB - Dec 19, 1932, UUV:[id number]  Admit date - 09/06/2014   Admitting Physician Oswald Hillock, MD  Outpatient Primary MD for the patient is [name], MD  LOS - 26   Chief Complaint  Patient presents with  . Leg Swelling      HPI on 09/06/2014 by Dr. Eleonore Chiquito 81 yr old male is presenting for worsening chronic edema and difficulty ambulating due to pain. Worsening pain in left and right lower legs has been constantly aching for approximately 1 year and is relieved by rest and worsened by activity. Associated symptoms include fatigue and swelling. He has chronic lower extremity at baseline but it is worsening and family reports the "meat is falling off" from his legs. No fever.  Due to his independent living situation, he has a difficult time caring for himself and (per son-in-law at bedside) has a nurse from [facility] making visits to his house twice a day.   Assessment & Plan   Acute on CKD stage 4 -Torsemide discontinued, and patient placed IVF -Despite IVF, creatinine trended upwards -Nephrology consulted and appreciated  -Patient restarted on diuretics, lasix -Cr 4.6 today -Renal US: negative hydronephrosis -Continue to monitor BMP -Discussed with daughter possible dialysis.  Patient is not a good candidate for dialysis given his multiple comorbidities.  Palliative care consulted and appreciated. -Other daughter wants all measures/including HD.    Acute Diastolic CHF exacerbation -Echocardiogram 03/11/2014 showed an EF of 55-60% -patient received IVF due to AKI -IVF discontinued and placed back on diuretics -Continue IV lasix 120mg  TID (as per nephrology recommendations) -Will attempt to monitor output as patient is incontinent and condom cath does not stay in place -Foley catheter placement attempted overnight.    -Will continue to monitor before consulting urology   Necrotic right foot/leg -Patient has chronic bilateral lower extremity sores, likely due to diabetes mellitus -Orthopedics consultation appreciated -Status post right AKA -Was on vanc and zosyn  -Continue wound care -Patient will likely be discharged to SNF  Post op fever/ Sepsis secondary to possible HCAP -Currently afebrile however had a fever of 101.3 on 09/14/2014 -Leukocytosis imroved (however antibiotics discontinued 09/17/2014) -Blood cultures 09/15/2014 show no growth to date -Strep pneumo and legionella urine antigens neg -Repeat CXR 7/30: suspected multifocal pna in B/L upper lobes and RLL -Patient received vanc and zosyn for 10 days (strange that HCAP would no resolve) -Spoke with ID, Dr. 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 Continue to monitor CBC  Sclerotic lesion on ribs on CXR -patient has sclerotic lesions on the ribs on CXR, -Dr [name] is aware of this finding as per his last note in epic. He will follow the patient as outpatient.  Hypothyroid -Continue synthroid 125 mcg  History of prostate cancer-15 yrs ago -stable   Essential hypertension -Stable, continue hydralazine 100 mg tablet, clonidine patch 0.1 mg  Dyslipidemia -continue statin   CAD, moderate -pt denies any chest pain.    Obesity  -Body mass index is 32.1 kg/(m^2).  Code Status: DNR   Family Communication: Daughter at bedside  Disposition Plan: Admitted.  Continue to monitor, restarting diuresis.   Time Spent in minutes   30 minutes  Procedures  Right AKA  Consults   Orthopedic surgery Dr.  Linus Salmons, ID, via phone Nephrology  DVT Prophylaxis  Heparin  Lab Results  Component Value Date   PLT 529* 09/19/2014    Medications  Scheduled Meds: . allopurinol  100 mg Oral QHS  . amiodarone  100 mg Oral Daily  . amLODipine  10 mg Oral Daily  . aspirin EC  81 mg Oral Daily  . atorvastatin  40 mg Oral q1800  . citalopram  20 mg Oral Daily  . feeding supplement (GLUCERNA SHAKE)  237 mL Oral BID BM  . furosemide  120 mg Intravenous 3 times per day  . heparin  5,000 Units Subcutaneous 3 times per day  . hydrALAZINE  100 mg Oral TID  . insulin aspart  0-9 Units Subcutaneous TID WC  . insulin detemir  30 Units Subcutaneous Daily  . isosorbide mononitrate  60 mg Oral Daily  . levothyroxine  125 mcg Oral QAC breakfast  . pantoprazole  40 mg Oral Daily  . silver sulfADIAZINE   Topical Daily  . sodium chloride  3 mL Intravenous Q12H   Continuous Infusions: . sodium chloride 10 mL/hr at 09/13/14 0111  . sodium chloride 10 mL/hr at 09/13/14 2007   PRN Meds:.sodium chloride, acetaminophen **OR** acetaminophen, albuterol, ALPRAZolam, methocarbamol **OR** methocarbamol (ROBAXIN)  IV, metoCLOPramide **OR** metoCLOPramide (REGLAN) injection, morphine injection, ondansetron **OR** ondansetron (ZOFRAN) IV, oxyCODONE, sodium chloride, zolpidem  Antibiotics    Anti-infectives    Start     Dose/Rate Route Frequency Ordered Stop   09/15/14 1600  piperacillin-tazobactam (ZOSYN) IVPB 2.25 g  Status:  Discontinued     2.25 g 100 mL/hr over 30 Minutes Intravenous Every 8 hours 09/15/14 0923 09/17/14 1258   09/14/14 0600  ceFAZolin (ANCEF) IVPB 2 g/50 mL premix  Status:  Discontinued     2 g 100 mL/hr over 30 Minutes Intravenous On call to O.R. 09/13/14 1710 09/13/14 1946   09/13/14 1711  ceFAZolin (ANCEF) 2-3 GM-% IVPB SOLR    CommentsAra Kussmaul   : cabinet override      09/13/14 1711 09/14/14 0529   09/09/14 1000  vancomycin (VANCOCIN) 1,500 mg in sodium chloride 0.9 % 500 mL IVPB   Status:  Discontinued     1,500 mg 250 mL/hr over 120 Minutes Intravenous Every 48 hours 09/07/14 0849 09/15/14 1116   09/07/14 1600  piperacillin-tazobactam (ZOSYN) IVPB 3.375 g  Status:  Discontinued     3.375 g 12.5 mL/hr over 240 Minutes Intravenous Every 8 hours 09/07/14 0849 09/15/14 0923   09/07/14 0900  vancomycin (VANCOCIN) 2,250 mg in sodium chloride 0.9 % 500 mL IVPB     2,250 mg 250 mL/hr over 120 Minutes Intravenous  Once 09/07/14 0820 09/07/14 1141   09/07/14 0900  piperacillin-tazobactam (ZOSYN) IVPB 3.375 g     3.375 g 100 mL/hr over 30 Minutes Intravenous  Once 09/07/14 0849 09/07/14 1011      Subjective:   [name] seen and examined today.  Patient has no complaints this morning.  Feels tired.  Patient denies chest pain, shortness of breath, abdominal pain, cough, dizziness, headache.  Continues to moan.   Objective:   Filed Vitals:   09/18/14 1911 09/18/14 2129 09/19/14 0618 09/19/14 0748  BP: 127/52 134/47 152/57 138/51  Pulse: 54 57 57 60  Temp: 98.9 F (37.2 C) 97.7 F (36.5 C) 97.5 F (36.4 C) 98.6 F (37 C)  TempSrc: Oral Oral Oral Oral  Resp: 20 18 20 19   Height:      Weight:      SpO2:  92% 92% 96%    Wt Readings from Last 3 Encounters:  09/18/14 106.5 kg (234 lb 12.6 oz)  06/22/14 110.678 kg (244 lb)  06/09/14 112.855 kg (248 lb 12.8 oz)     Intake/Output Summary (Last 24 hours) at 09/19/14 1131 Last data filed at 09/19/14 0931  Gross per 24 hour  Intake   1081 ml  Output      0 ml  Net   1081 ml    Exam  General: Well developed, well nourished, no distress  HEENT: NCAT, mucous membranes moist.   Cardiovascular: S1 S2 auscultated, no murmurs  Respiratory: Crackles lower lobes bilaterally, otherwise clear  Abdomen: Soft, nontender, nondistended, + bowel sounds  Extremities: R AKA, LLE unna boot  Data Review   Micro Results Recent Results (from the past 240 hour(s))  Culture, blood (routine x 2)     Status: None  (Preliminary result)   Collection Time: 09/15/14  3:20 PM  Result Value Ref Range Status   Specimen Description BLOOD LEFT ARM  Final   Special Requests BOTTLES DRAWN AEROBIC AND ANAEROBIC 10CC  Final   Culture NO GROWTH 3 DAYS  Final   Report Status PENDING  Incomplete  Culture, blood (routine x 2)     Status: None (Preliminary result)   Collection Time: 09/15/14  3:30 PM  Result Value Ref Range Status   Specimen Description BLOOD LEFT HAND  Final   Special Requests BOTTLES DRAWN AEROBIC ONLY 3CC  Final   Culture NO GROWTH 3 DAYS  Final   Report Status PENDING  Incomplete  Culture, Urine     Status: None   Collection Time: 09/16/14  1:44 AM  Result Value Ref Range Status   Specimen Description URINE, RANDOM  Final   Special Requests NONE  Final   Culture 30,000 COLONIES/mL YEAST  Final   Report Status 09/17/2014 FINAL  Final    Radiology Reports Dg Chest 1 View  09/07/2014   CLINICAL DATA:  Fluid overload, history CHF, COPD, hypertension, diabetes mellitus, prostate cancer  EXAM: CHEST  1 VIEW  COMPARISON:  Portable exam 1902 hours compared to 09/06/2014  FINDINGS: Enlargement of cardiac silhouette.  Atherosclerotic calcification aorta.  Mediastinal contour stable.  Diffuse pulmonary infiltrates question edema versus less likely infection, increased versus previous study.  No pleural effusion or pneumothorax.  Sclerotic foci at the proximal LEFT humerus question related to metastatic disease  IMPRESSION: Enlargement of cardiac silhouette with diffuse BILATERAL infiltrates favoring pulmonary edema over infection.  Sclerotic foci at the proximal LEFT humerus cannot exclude sclerotic osseous metastases in this patient with a history of prostate cancer.   Electronically Signed   By: Lavonia Dana M.D.   On: 09/07/2014 20:02   Dg Chest 2 View  09/09/2014   CLINICAL DATA:  81 year old male with fever, cough and congestion  EXAM: CHEST  2 VIEW  COMPARISON:  Prior chest x-ray 09/07/2014  FINDINGS:  Overall, there has been some improvement in diffuse bilateral interstitial and airspace opacities. However, there remains some focal airspace opacification within the right mid lung. No pleural effusion. No pneumothorax. Stable cardiomegaly and mediastinal contours. Atherosclerotic calcification remains present within the transverse aorta.  IMPRESSION: 1. Improving pulmonary edema. 2. Residual patchy airspace opacity in the right mid lung in the region of the minor fissure may represent superimposed infiltrate/pneumonia, or perhaps some fluid trapped within the minor fissure. 3. Stable cardiomegaly.   Electronically Signed   By: Jacqulynn Cadet M.D.   On: 09/09/2014 10:21   US Renal  09/17/2014   CLINICAL DATA:  Acute renal insufficiency  EXAM: RENAL / URINARY TRACT ULTRASOUND COMPLETE  COMPARISON:  None.  FINDINGS: Right Kidney:  Length: 10.2 cm. Echogenicity within normal limits. No mass or hydronephrosis visualized.  Left Kidney:  Length: 10.5 cm.  Limited views, negative for hydronephrosis.  Bladder:  Not seen.  IMPRESSION: Negative for hydronephrosis.  Limited views of the left kidney.   Electronically Signed   By: Andreas Newport M.D.   On: 09/17/2014 02:44   Nm Pulmonary Perf And Vent  09/08/2014   CLINICAL DATA:  Shortness of breath.  EXAM: NUCLEAR MEDICINE VENTILATION - PERFUSION LUNG SCAN  TECHNIQUE: Ventilation images were obtained in multiple projections using inhaled aerosol Tc-67m DTPA. Perfusion images were obtained in multiple projections after intravenous injection of Tc-63m MAA.  RADIOPHARMACEUTICALS:  39.2 Technetium-78m DTPA aerosol inhalation and 6.45 Technetium-67m MAA IV  COMPARISON:  Chest x-ray 09/07/2014.  FINDINGS: Ventilation: Severe bilateral ventilatory defects. No segmental sized perfusion defects noted.  Perfusion: No wedge shaped peripheral perfusion defects to suggest acute pulmonary embolism.  IMPRESSION: Severe bilateral ventilatory defects consistent with bilateral  airspace disease noted on recent chest x-ray. No perfusion defects noted to suggest pulmonary embolus. Low probability scan for pulmonary embolus.   Electronically Signed   By: Marcello Moores  Register   On: 09/08/2014 12:12   Dg Chest Port 1 View  09/18/2014   CLINICAL DATA:  Leukocytosis  EXAM: PORTABLE CHEST - 1 VIEW  COMPARISON:  09/15/2014  FINDINGS: Multifocal patchy opacities in the right upper and lower lobes and left prior hilar region, suspicious for multifocal pneumonia, less likely interstitial edema. No definite pleural effusions. No pneumothorax.  The heart is top-normal in size.  IMPRESSION: Suspected multifocal pneumonia in the bilateral upper lobes and right lower lobe, grossly unchanged.   Electronically Signed   By: Julian Hy M.D.   On: 09/18/2014 08:46   Dg Chest Port 1 View  09/15/2014   CLINICAL DATA:  Fever and difficulty breathing  EXAM: PORTABLE CHEST - 1 VIEW  COMPARISON:  September 09, 2014  FINDINGS: There is generalized interstitial edema. There remains slight alveolar consolidation in the mid right lung, less than on recent prior study. Heart is enlarged. The pulmonary vascular is within normal limits. No adenopathy.  IMPRESSION: Findings consistent with congestive heart failure. A small focus of pneumonia in the right mid lung cannot be excluded.   Electronically Signed   By: Lowella Grip III M.D.   On: 09/15/2014 16:53   Dg Chest Portable 1 View  09/06/2014   CLINICAL DATA:  Lower extremity weakness  EXAM: PORTABLE CHEST - 1 VIEW  COMPARISON:  March 10, 2014  FINDINGS: There is scarring in both mid lung regions. There is no frank edema or consolidation. Heart is mildly enlarged with pulmonary vascular within normal limits. There is atherosclerotic change in aorta. The bony structures appear  stable compared to prior study without focal lesion. Several bony structures appear rather sclerotic a particularly in the medial proximal right humerus, incompletely visualized on this  study.  IMPRESSION: Areas of lung scarring. No frank edema or consolidation. Several bones appear somewhat sclerotic. This finding may warrant prostate evaluation given the propensity of prostate carcinoma to present with sclerotic bony metastases.   Electronically Signed   By: Lowella Grip III M.D.   On: 09/06/2014 09:20   Dg Foot Complete Right  09/06/2014   CLINICAL DATA:  Right foot pain, wound.  EXAM: RIGHT FOOT COMPLETE - 3+ VIEW  COMPARISON:  None.  FINDINGS: Soft tissue swelling within the right foot. No acute bony abnormality. No acute fracture, subluxation or dislocation. Possible old healed fractures in the proximal second and third metatarsals. No soft tissue gas.  IMPRESSION: No acute bony abnormality.   Electronically Signed   By: Rolm Baptise M.D.   On: 09/06/2014 14:55    CBC  Recent Labs Lab 09/13/14 1720  09/15/14 0531 09/16/14 0502 09/17/14 0523 09/18/14 0453 09/19/14 0504  WBC 16.4*  < > 18.3* 19.7* 19.4* 16.4* 18.1*  HGB 9.0*  < > 8.2* 8.2* 8.1* 7.4* 8.9*  HCT 29.7*  < > 26.6* 26.2* 26.1* 24.7* 29.0*  PLT 415*  < > 435* 471* 492* 538* 529*  MCV 80.7  < > 81.1 80.6 81.3 81.8 83.8  MCH 24.5*  < > 25.0* 25.2* 25.2* 24.5* 25.7*  MCHC 30.3  < > 30.8 31.3 31.0 30.0 30.7  RDW 19.6*  < > 19.9* 20.2* 20.2* 20.5* 20.5*  LYMPHSABS 0.8  --   --   --   --   --   --   MONOABS 1.1*  --   --   --   --   --   --   EOSABS 0.4  --   --   --   --   --   --   BASOSABS 0.0  --   --   --   --   --   --   < > = values in this interval not displayed.  Chemistries   Recent Labs Lab 09/13/14 1720  09/15/14 0531 09/16/14 0502 09/17/14 0523 09/18/14 0453 09/19/14 0504  NA 143  < > 138 139 137 141 141  K 3.8  < > 4.0 3.8 4.0 4.0 4.0  CL 111  < > 104 104 105 109 106  CO2 23  < > 23 22 21* 20* 19*  GLUCOSE 130*  < > 181* 172* 117* 83 77  BUN 51*  < > 64* 73* 73* 79* 84*  CREATININE 3.21*  < > 4.45* 5.08* 4.97* 4.90* 4.68*  CALCIUM 8.4*  < > 8.2* 8.0* 7.9* 8.0* 8.4*  AST 24   --   --   --   --   --   --   ALT 22  --   --   --   --   --   --   ALKPHOS 120  --   --   --   --   --   --   BILITOT 0.6  --   --   --   --   --   --   < > = values in this interval not displayed. ------------------------------------------------------------------------------------------------------------------ estimated creatinine clearance is 15.5 mL/min (by C-G formula based on Cr of 4.68). ------------------------------------------------------------------------------------------------------------------ No results for input(s): HGBA1C in the last 72 hours. ------------------------------------------------------------------------------------------------------------------ No results for  input(s): CHOL, HDL, LDLCALC, TRIG, CHOLHDL, LDLDIRECT in the last 72 hours. ------------------------------------------------------------------------------------------------------------------ No results for input(s): TSH, T4TOTAL, T3FREE, THYROIDAB in the last 72 hours.  Invalid input(s): FREET3 ------------------------------------------------------------------------------------------------------------------ No results for input(s): VITAMINB12, FOLATE, FERRITIN, TIBC, IRON, RETICCTPCT in the last 72 hours.  Coagulation profile  Recent Labs Lab 09/13/14 1720  INR 1.25    No results for input(s): DDIMER in the last 72 hours.  Cardiac Enzymes No results for input(s): CKMB, TROPONINI, MYOGLOBIN in the last 168 hours.  Invalid input(s): CK ------------------------------------------------------------------------------------------------------------------ Invalid input(s): POCBNP    Gennavieve Huq D.O. on 09/19/2014 at 11:31 AM  Between 7am to 7pm - Pager - [phone]  After 7pm go to [email or web address] - password [email or web address]  And look for the night coverage person covering for me after hours  [facility] Office  [phone]

## 2014-09-20 DIAGNOSIS — N185 Chronic kidney disease, stage 5: Secondary | ICD-10-CM

## 2014-09-20 LAB — BASIC METABOLIC PANEL
Anion gap: 11 (ref 5–15)
BUN: 84 mg/dL — AB (ref 6–20)
CALCIUM: 8.2 mg/dL — AB (ref 8.9–10.3)
CHLORIDE: 110 mmol/L (ref 101–111)
CO2: 21 mmol/L — ABNORMAL LOW (ref 22–32)
CREATININE: 4.35 mg/dL — AB (ref 0.61–1.24)
GFR calc Af Amer: 14 mL/min — ABNORMAL LOW (ref >60)
GFR calc non Af Amer: 12 mL/min — ABNORMAL LOW (ref >60)
GLUCOSE: 106 mg/dL — AB (ref 65–99)
Potassium: 3.8 mmol/L (ref 3.5–5.1)
SODIUM: 142 mmol/L (ref 135–145)

## 2014-09-20 LAB — CULTURE, BLOOD (ROUTINE X 2)
Culture: NO GROWTH
Culture: NO GROWTH

## 2014-09-20 LAB — CBC
HCT: 28.4 % — ABNORMAL LOW (ref 39.0–52.0)
Hemoglobin: 8.6 g/dL — ABNORMAL LOW (ref 13.0–17.0)
MCH: 25.3 pg — AB (ref 26.0–34.0)
MCHC: 30.3 g/dL (ref 30.0–36.0)
MCV: 83.5 fL (ref 78.0–100.0)
Platelets: 566 10*3/uL — ABNORMAL HIGH (ref 150–400)
RBC: 3.4 MIL/uL — ABNORMAL LOW (ref 4.22–5.81)
RDW: 20.9 % — ABNORMAL HIGH (ref 11.5–15.5)
WBC: 17.4 10*3/uL — ABNORMAL HIGH (ref 4.0–10.5)

## 2014-09-20 LAB — GLUCOSE, CAPILLARY
Glucose-Capillary: 124 mg/dL — ABNORMAL HIGH (ref 65–99)
Glucose-Capillary: 121 mg/dL — ABNORMAL HIGH (ref 65–99)
Glucose-Capillary: 96 mg/dL (ref 65–99)

## 2014-09-20 NOTE — Progress Notes (Signed)
Chaplain responded nurse request.  Spoke with Pt, briefly, son was getting something to eat, Pt, wants to speak with doctors about what would be next for him. Related that message to nurse and informed the Pt, that the doctor would be by later to day to speak with family.   09/20/14 1200  Clinical Encounter Type  Visited With Patient and family together;Health care provider  Visit Type Initial;Spiritual support  Referral From Nurse  Spiritual Encounters  Spiritual Needs Emotional

## 2014-09-20 NOTE — Progress Notes (Signed)
PT Cancellation and Discharge Note  Patient Details Name: [name] MRN: [id number] DOB: 1932/11/21   Cancelled Treatment:    Reason Eval/Treat Not Completed: Other (comment)   Noted recent changes in plan of care to goals of comfort with plan to dc to [facility]idential [facility];   At this point, acute PT is no longer indicated in Plan of Care;   Will sign off;  It has been a pleasure serving [name] and his family;   Thank you,  Roney Marion, [place] Pager [phone] Office [phone]    Roney Marion [facility] 09/20/2014, 2:27 PM

## 2014-09-20 NOTE — Clinical Social Work Note (Signed)
CSW was informed that discharge plan has changed from skilled nursing facility to residential hospice. CSW spoke with patient's daughter, [name] by phone [phone]) regarding residential hospice and offered choice. Her 1st choice is [facility] and her 2nd choice is [facility]. Contact made with Erling Conte, CSW with [facility] and referral made via voice mail.   Mirca Yale Givens, MSW, LCSW Licensed Clinical Social Worker [place] [phone]

## 2014-09-20 NOTE — Care Management Important Message (Signed)
Important Message  Patient Details  Name: [name] MRN: [id number] Date of Birth: 1932-09-28   Medicare Important Message Given:  Yes-third notification given    Delorse Lek 09/20/2014, 12:07 PM

## 2014-09-20 NOTE — Progress Notes (Signed)
[facility]                                                                              Patient Demographics  [name], is a 81 y.o. male, DOB - 10/06/1932, ZOX:[id number]  Admit date - 09/06/2014   Admitting Physician Oswald Hillock, MD  Outpatient Primary MD for the patient is [name], MD  LOS - 25   Chief Complaint  Patient presents with  . Leg Swelling      HPI on 09/06/2014 by Dr. Eleonore Chiquito 80 yr old male is presenting for worsening chronic edema and difficulty ambulating due to pain. Worsening pain in left and right lower legs has been constantly aching for approximately 1 year and is relieved by rest and worsened by activity. Associated symptoms include fatigue and swelling. He has chronic lower extremity at baseline but it is worsening and family reports the "meat is falling off" from his legs. No fever.  Due to his independent living situation, he has a difficult time caring for himself and (per son-in-law at bedside) has a nurse from [place] making visits to his house twice a day.   Assessment & Plan   Acute on CKD stage 4 -Torsemide discontinued, and patient placed IVF -Despite IVF, creatinine trended upwards -Nephrology consulted and appreciated  -Continue lasix -Cr 4.3 today -Renal US: negative hydronephrosis -Continue to monitor BMP -Discussed with daughter possible dialysis.  Patient is not a good candidate for dialysis given his multiple comorbidities.  Palliative care consulted and appreciated. -family/children and patient have agreed to hospice, no HD  Acute Diastolic CHF exacerbation -Echocardiogram 03/11/2014 showed an EF of 55-60% -patient received IVF due to AKI -IVF discontinued and placed back on diuretics -Continue IV lasix 120mg  TID (as per nephrology recommendations) -Will attempt to monitor output as patient is incontinent and condom cath does not stay in place -Foley catheter placement attempted overnight.   Necrotic  right foot/leg -Patient has chronic bilateral lower extremity sores, likely due to diabetes mellitus -Orthopedics consultation appreciated -Status post right AKA -Was on vanc and zosyn  -Continue wound care -Patient will likely be discharged to SNF  Post op fever/ Sepsis secondary to possible HCAP -Currently afebrile however had a fever of 101.3 on 09/14/2014 -Leukocytosis imroved (however antibiotics discontinued 09/17/2014) -Blood cultures 09/15/2014 show no growth to date -Strep pneumo and legionella urine antigens neg -Repeat CXR 7/30: suspected multifocal pna in B/L upper lobes and RLL -Patient received vanc and zosyn for 10 days (strange that HCAP would no resolve) -Spoke with ID, Dr. 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 Continue to monitor CBC  Sclerotic lesion on ribs on CXR -patient has sclerotic lesions on the ribs on CXR, -Dr [name] is aware of this finding as per his last note in epic. He will follow the patient as outpatient.  Hypothyroid -Continue synthroid 125 mcg  History of prostate cancer-15  yrs ago -stable   Essential hypertension -Stable, continue hydralazine 100 mg tablet, clonidine patch 0.1 mg  Dyslipidemia -continue statin   CAD, moderate -pt denies any chest pain.    Obesity  -Body mass index is 32.1 kg/(m^2).  Code Status: DNR   Family Communication: Son at bedside  Disposition Plan: Admitted.  Continue to monitor and diuresis.  Pending Hospice  Time Spent in minutes   30 minutes  Procedures  Right AKA  Consults   Orthopedic surgery Dr. Linus Salmons, ID, via phone Nephrology Palliative care  DVT  Prophylaxis  Heparin  Lab Results  Component Value Date   PLT 566* 09/20/2014    Medications  Scheduled Meds: . allopurinol  100 mg Oral QHS  . amiodarone  100 mg Oral Daily  . amLODipine  10 mg Oral Daily  . aspirin EC  81 mg Oral Daily  . atorvastatin  40 mg Oral q1800  . citalopram  20 mg Oral Daily  . feeding supplement (GLUCERNA SHAKE)  237 mL Oral BID BM  . furosemide  120 mg Intravenous 3 times per day  . heparin  5,000 Units Subcutaneous 3 times per day  . hydrALAZINE  100 mg Oral TID  . insulin aspart  0-9 Units Subcutaneous TID WC  . insulin detemir  30 Units Subcutaneous Daily  . isosorbide mononitrate  60 mg Oral Daily  . levothyroxine  125 mcg Oral QAC breakfast  . pantoprazole  40 mg Oral Daily  . silver sulfADIAZINE   Topical Daily  . sodium chloride  3 mL Intravenous Q12H   Continuous Infusions: . sodium chloride 10 mL/hr at 09/13/14 0111  . sodium chloride 10 mL/hr at 09/13/14 2007   PRN Meds:.sodium chloride, acetaminophen **OR** acetaminophen, albuterol, ALPRAZolam, methocarbamol **OR** methocarbamol (ROBAXIN)  IV, metoCLOPramide **OR** metoCLOPramide (REGLAN) injection, morphine injection, ondansetron **OR** ondansetron (ZOFRAN) IV, oxyCODONE, sodium chloride, zolpidem  Antibiotics    Anti-infectives    Start     Dose/Rate Route Frequency Ordered Stop   09/15/14 1600  piperacillin-tazobactam (ZOSYN) IVPB 2.25 g  Status:  Discontinued     2.25 g 100 mL/hr over 30 Minutes Intravenous Every 8 hours 09/15/14 0923 09/17/14 1258   09/14/14 0600  ceFAZolin (ANCEF) IVPB 2 g/50 mL premix  Status:  Discontinued     2 g 100 mL/hr over 30 Minutes Intravenous On call to O.R. 09/13/14 1710 09/13/14 1946   09/13/14 1711  ceFAZolin (ANCEF) 2-3 GM-% IVPB SOLR    CommentsAra Kussmaul   : cabinet override      09/13/14 1711 09/14/14 0529   09/09/14 1000  vancomycin (VANCOCIN) 1,500 mg in sodium chloride 0.9 % 500 mL IVPB  Status:  Discontinued     1,500 mg 250  mL/hr over 120 Minutes Intravenous Every 48 hours 09/07/14 0849 09/15/14 1116   09/07/14 1600  piperacillin-tazobactam (ZOSYN) IVPB 3.375 g  Status:  Discontinued     3.375 g 12.5 mL/hr over 240 Minutes Intravenous Every 8 hours 09/07/14 0849 09/15/14 0923   09/07/14 0900  vancomycin (VANCOCIN) 2,250 mg in sodium chloride 0.9 % 500 mL IVPB     2,250 mg 250 mL/hr over 120 Minutes Intravenous  Once 09/07/14 0820 09/07/14 1141   09/07/14 0900  piperacillin-tazobactam (ZOSYN) IVPB 3.375 g     3.375 g 100 mL/hr over 30 Minutes Intravenous  Once 09/07/14 0849 09/07/14 1011      Subjective:   [name] seen and examined today.  Patient has no complaints this  morning. Patient denies chest pain, shortness of breath, abdominal pain, cough, dizziness, headache.  Objective:   Filed Vitals:   09/19/14 1600 09/19/14 2135 09/20/14 0442 09/20/14 0959  BP:  135/44 142/57 136/45  Pulse:  57 59 58  Temp:  98.7 F (37.1 C) 98.3 F (36.8 C) 98.4 F (36.9 C)  TempSrc:  Oral Oral Oral  Resp:  18 20 20   Height:  5' 10.5" (1.791 m)    Weight:  107.8 kg (237 lb 10.5 oz)    SpO2: 93% 95% 91% 94%    Wt Readings from Last 3 Encounters:  09/19/14 107.8 kg (237 lb 10.5 oz)  06/22/14 110.678 kg (244 lb)  06/09/14 112.855 kg (248 lb 12.8 oz)     Intake/Output Summary (Last 24 hours) at 09/20/14 1446 Last data filed at 09/20/14 1300  Gross per 24 hour  Intake    846 ml  Output   1100 ml  Net   -254 ml    Exam  General: Well developed, well nourished, no distress  HEENT: NCAT, mucous membranes moist.   Cardiovascular: S1 S2 auscultated, no murmurs  Respiratory: Diminished breath sounds  Abdomen: Soft, nontender, nondistended, + bowel sounds  Extremities: R AKA, LLE unna boot  Data Review   Micro Results Recent Results (from the past 240 hour(s))  Culture, blood (routine x 2)     Status: None (Preliminary result)   Collection Time: 09/15/14  3:20 PM  Result Value Ref Range  Status   Specimen Description BLOOD LEFT ARM  Final   Special Requests BOTTLES DRAWN AEROBIC AND ANAEROBIC 10CC  Final   Culture NO GROWTH 4 DAYS  Final   Report Status PENDING  Incomplete  Culture, blood (routine x 2)     Status: None (Preliminary result)   Collection Time: 09/15/14  3:30 PM  Result Value Ref Range Status   Specimen Description BLOOD LEFT HAND  Final   Special Requests BOTTLES DRAWN AEROBIC ONLY 3CC  Final   Culture NO GROWTH 4 DAYS  Final   Report Status PENDING  Incomplete  Culture, Urine     Status: None   Collection Time: 09/16/14  1:44 AM  Result Value Ref Range Status   Specimen Description URINE, RANDOM  Final   Special Requests NONE  Final   Culture 30,000 COLONIES/mL YEAST  Final   Report Status 09/17/2014 FINAL  Final    Radiology Reports Dg Chest 1 View  09/07/2014   CLINICAL DATA:  Fluid overload, history CHF, COPD, hypertension, diabetes mellitus, prostate cancer  EXAM: CHEST  1 VIEW  COMPARISON:  Portable exam 1902 hours compared to 09/06/2014  FINDINGS: Enlargement of cardiac silhouette.  Atherosclerotic calcification aorta.  Mediastinal contour stable.  Diffuse pulmonary infiltrates question edema versus less likely infection, increased versus previous study.  No pleural effusion or pneumothorax.  Sclerotic foci at the proximal LEFT humerus question related to metastatic disease  IMPRESSION: Enlargement of cardiac silhouette with diffuse BILATERAL infiltrates favoring pulmonary edema over infection.  Sclerotic foci at the proximal LEFT humerus cannot exclude sclerotic osseous metastases in this patient with a history of prostate cancer.   Electronically Signed   By: Lavonia Dana M.D.   On: 09/07/2014 20:02   Dg Chest 2 View  09/09/2014   CLINICAL DATA:  81 year old male with fever, cough and congestion  EXAM: CHEST  2 VIEW  COMPARISON:  Prior chest x-ray 09/07/2014  FINDINGS: Overall, there has been some improvement in diffuse bilateral interstitial and  airspace opacities. However, there remains some focal airspace opacification within the right mid lung. No pleural effusion. No pneumothorax. Stable cardiomegaly and mediastinal contours. Atherosclerotic calcification remains present within the transverse aorta.  IMPRESSION: 1. Improving pulmonary edema. 2. Residual patchy airspace opacity in the right mid lung in the region of the minor fissure may represent superimposed infiltrate/pneumonia, or perhaps some fluid trapped within the minor fissure. 3. Stable cardiomegaly.   Electronically Signed   By: Jacqulynn Cadet M.D.   On: 09/09/2014 10:21   US Renal  09/17/2014   CLINICAL DATA:  Acute renal insufficiency  EXAM: RENAL / URINARY TRACT ULTRASOUND COMPLETE  COMPARISON:  None.  FINDINGS: Right Kidney:  Length: 10.2 cm. Echogenicity within normal limits. No mass or hydronephrosis visualized.  Left Kidney:  Length: 10.5 cm.  Limited views, negative for hydronephrosis.  Bladder:  Not seen.  IMPRESSION: Negative for hydronephrosis.  Limited views of the left kidney.   Electronically Signed   By: Andreas Newport M.D.   On: 09/17/2014 02:44   Nm Pulmonary Perf And Vent  09/08/2014   CLINICAL DATA:  Shortness of breath.  EXAM: NUCLEAR MEDICINE VENTILATION - PERFUSION LUNG SCAN  TECHNIQUE: Ventilation images were obtained in multiple projections using inhaled aerosol Tc-27m DTPA. Perfusion images were obtained in multiple projections after intravenous injection of Tc-86m MAA.  RADIOPHARMACEUTICALS:  39.2 Technetium-95m DTPA aerosol inhalation and 6.45 Technetium-27m MAA IV  COMPARISON:  Chest x-ray 09/07/2014.  FINDINGS: Ventilation: Severe bilateral ventilatory defects. No segmental sized perfusion defects noted.  Perfusion: No wedge shaped peripheral perfusion defects to suggest acute pulmonary embolism.  IMPRESSION: Severe bilateral ventilatory defects consistent with bilateral airspace disease noted on recent chest x-ray. No perfusion defects noted to suggest  pulmonary embolus. Low probability scan for pulmonary embolus.   Electronically Signed   By: Marcello Moores  Register   On: 09/08/2014 12:12   Dg Chest Port 1 View  09/18/2014   CLINICAL DATA:  Leukocytosis  EXAM: PORTABLE CHEST - 1 VIEW  COMPARISON:  09/15/2014  FINDINGS: Multifocal patchy opacities in the right upper and lower lobes and left prior hilar region, suspicious for multifocal pneumonia, less likely interstitial edema. No definite pleural effusions. No pneumothorax.  The heart is top-normal in size.  IMPRESSION: Suspected multifocal pneumonia in the bilateral upper lobes and right lower lobe, grossly unchanged.   Electronically Signed   By: Julian Hy M.D.   On: 09/18/2014 08:46   Dg Chest Port 1 View  09/15/2014   CLINICAL DATA:  Fever and difficulty breathing  EXAM: PORTABLE CHEST - 1 VIEW  COMPARISON:  September 09, 2014  FINDINGS: There is generalized interstitial edema. There remains slight alveolar consolidation in the mid right lung, less than on recent prior study. Heart is enlarged. The pulmonary vascular is within normal limits. No adenopathy.  IMPRESSION: Findings consistent with congestive heart failure. A small focus of pneumonia in the right mid lung cannot be excluded.   Electronically Signed   By: Lowella Grip III M.D.   On: 09/15/2014 16:53   Dg Chest Portable 1 View  09/06/2014   CLINICAL DATA:  Lower extremity weakness  EXAM: PORTABLE CHEST - 1 VIEW  COMPARISON:  March 10, 2014  FINDINGS: There is scarring in both mid lung regions. There is no frank edema or consolidation. Heart is mildly enlarged with pulmonary vascular within normal limits. There is atherosclerotic change in aorta. The bony structures appear stable compared to prior study without focal lesion. Several bony structures appear rather  sclerotic a particularly in the medial proximal right humerus, incompletely visualized on this study.  IMPRESSION: Areas of lung scarring. No frank edema or consolidation. Several  bones appear somewhat sclerotic. This finding may warrant prostate evaluation given the propensity of prostate carcinoma to present with sclerotic bony metastases.   Electronically Signed   By: Lowella Grip III M.D.   On: 09/06/2014 09:20   Dg Foot Complete Right  09/06/2014   CLINICAL DATA:  Right foot pain, wound.  EXAM: RIGHT FOOT COMPLETE - 3+ VIEW  COMPARISON:  None.  FINDINGS: Soft tissue swelling within the right foot. No acute bony abnormality. No acute fracture, subluxation or dislocation. Possible old healed fractures in the proximal second and third metatarsals. No soft tissue gas.  IMPRESSION: No acute bony abnormality.   Electronically Signed   By: Rolm Baptise M.D.   On: 09/06/2014 14:55    CBC  Recent Labs Lab 09/13/14 1720  09/16/14 0502 09/17/14 0523 09/18/14 0453 09/19/14 0504 09/20/14 0450  WBC 16.4*  < > 19.7* 19.4* 16.4* 18.1* 17.4*  HGB 9.0*  < > 8.2* 8.1* 7.4* 8.9* 8.6*  HCT 29.7*  < > 26.2* 26.1* 24.7* 29.0* 28.4*  PLT 415*  < > 471* 492* 538* 529* 566*  MCV 80.7  < > 80.6 81.3 81.8 83.8 83.5  MCH 24.5*  < > 25.2* 25.2* 24.5* 25.7* 25.3*  MCHC 30.3  < > 31.3 31.0 30.0 30.7 30.3  RDW 19.6*  < > 20.2* 20.2* 20.5* 20.5* 20.9*  LYMPHSABS 0.8  --   --   --   --   --   --   MONOABS 1.1*  --   --   --   --   --   --   EOSABS 0.4  --   --   --   --   --   --   BASOSABS 0.0  --   --   --   --   --   --   < > = values in this interval not displayed.  Chemistries   Recent Labs Lab 09/13/14 1720  09/16/14 0502 09/17/14 0523 09/18/14 0453 09/19/14 0504 09/20/14 0450  NA 143  < > 139 137 141 141 142  K 3.8  < > 3.8 4.0 4.0 4.0 3.8  CL 111  < > 104 105 109 106 110  CO2 23  < > 22 21* 20* 19* 21*  GLUCOSE 130*  < > 172* 117* 83 77 106*  BUN 51*  < > 73* 73* 79* 84* 84*  CREATININE 3.21*  < > 5.08* 4.97* 4.90* 4.68* 4.35*  CALCIUM 8.4*  < > 8.0* 7.9* 8.0* 8.4* 8.2*  AST 24  --   --   --   --   --   --   ALT 22  --   --   --   --   --   --   ALKPHOS 120   --   --   --   --   --   --   BILITOT 0.6  --   --   --   --   --   --   < > = values in this interval not displayed. ------------------------------------------------------------------------------------------------------------------ estimated creatinine clearance is 16.8 mL/min (by C-G formula based on Cr of 4.35). ------------------------------------------------------------------------------------------------------------------ No results for input(s): HGBA1C in the last 72 hours. ------------------------------------------------------------------------------------------------------------------ No results for input(s): CHOL, HDL, LDLCALC, TRIG, CHOLHDL, LDLDIRECT in the last 72 hours. ------------------------------------------------------------------------------------------------------------------  No results for input(s): TSH, T4TOTAL, T3FREE, THYROIDAB in the last 72 hours.  Invalid input(s): FREET3 ------------------------------------------------------------------------------------------------------------------ No results for input(s): VITAMINB12, FOLATE, FERRITIN, TIBC, IRON, RETICCTPCT in the last 72 hours.  Coagulation profile  Recent Labs Lab 09/13/14 1720  INR 1.25    No results for input(s): DDIMER in the last 72 hours.  Cardiac Enzymes No results for input(s): CKMB, TROPONINI, MYOGLOBIN in the last 168 hours.  Invalid input(s): CK ------------------------------------------------------------------------------------------------------------------ Invalid input(s): POCBNP    Kendahl Bumgardner D.O. on 09/20/2014 at 2:46 PM  Between 7am to 7pm - Pager - [phone]  After 7pm go to [email or web address] - password [email or web address]  And look for the night coverage person covering for me after hours  [facility] Office  [phone]

## 2014-09-21 DIAGNOSIS — I5033 Acute on chronic diastolic (congestive) heart failure: Secondary | ICD-10-CM

## 2014-09-21 DIAGNOSIS — N179 Acute kidney failure, unspecified: Secondary | ICD-10-CM

## 2014-09-21 LAB — GLUCOSE, CAPILLARY
Glucose-Capillary: 98 mg/dL (ref 65–99)
Glucose-Capillary: 112 mg/dL — ABNORMAL HIGH (ref 65–99)
Glucose-Capillary: 98 mg/dL (ref 65–99)
Glucose-Capillary: 120 mg/dL — ABNORMAL HIGH (ref 65–99)

## 2014-09-21 MED ORDER — TORSEMIDE 20 MG PO TABS
80.0000 mg | ORAL_TABLET | Freq: Two times a day (BID) | ORAL | Status: DC
  Administered 2014-09-21 – 2014-09-23 (×5): 80 mg via ORAL
  Filled 2014-09-21 (×5): qty 4

## 2014-09-21 MED ORDER — METOLAZONE 5 MG PO TABS
5.0000 mg | ORAL_TABLET | Freq: Every day | ORAL | Status: DC
  Administered 2014-09-21 – 2014-09-23 (×3): 5 mg via ORAL
  Filled 2014-09-21 (×4): qty 1

## 2014-09-21 MED ORDER — NEPRO/CARBSTEADY PO LIQD
237.0000 mL | Freq: Two times a day (BID) | ORAL | Status: DC
  Administered 2014-09-21 – 2014-09-23 (×5): 237 mL via ORAL
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
## 2014-09-21 NOTE — Clinical Social Work Note (Signed)
CSW informed this morning by Erling Conte, CSW with [facility] and Yetta Glassman, RN with [facility] that no beds available. Contact made with patient's daughter (POA) [name] McIntryre regarding Hospice and initially she requested [facility], then in a subsequent conversation, Ms. McIntryre requested [facility] and ST rehab (CSW clarified purpose of [place] placement with daughter). CSW contacted [facility] admissions' staff Angela Nevin regarding placement and updated clinicals requested as current authorization expires today. MD has placed order for PT evaluation and CSW followed with therapist regarding need for re-evaluation.  CSW will continue to follow and assist with an appropriate discharge plan for patient.  Mclane Arora Givens, MSW, LCSW Licensed Clinical Social Worker [place] [phone]

## 2014-09-21 NOTE — Progress Notes (Signed)
[facility]                                                                              Patient Demographics  [name], is a 81 y.o. male, DOB - [birth year], RKY:[id number]  Admit date - 09/06/2014   Admitting Physician Oswald Hillock, MD  Outpatient Primary MD for the patient is [name], MD  LOS - 15   Chief Complaint  Patient presents with  . Leg Swelling      Interim history 81 year old male with history of CKD, CHF presented with worsening edema and difficulty ambulating. Patient is s/p RAKA.  Developed worsening kidney function as well as CHF exacerbation. Currently on diuresis. Patient found to not be a good candidate for dialysis. Patient's family had decided on hospice however daughter has now changed her mind yet again. Pending repeat PT evaluation and possible SNF placement.  Assessment & Plan   Acute on CKD stage 4 -Torsemide discontinued, and patient placed IVF -Despite IVF, creatinine trended upwards -Nephrology consulted and appreciated  -Continue lasix -Cr 4.3  -Renal US: negative hydronephrosis -Continue to monitor BMP -Discussed with daughter possible dialysis.  Patient is not a good candidate for dialysis given his multiple comorbidities.  Palliative care consulted and appreciated  Acute Diastolic CHF exacerbation -Echocardiogram 03/11/2014 showed an EF of 55-60% -patient received IVF due to AKI -IVF discontinued and placed back on diuretics -Continue IV lasix 120mg  TID (as per nephrology recommendations) -Will attempt to monitor output as patient is incontinent and condom cath does not stay in place -Spoke with Dr. Haroldine Laws.  He will try to speak with the daughter.   Necrotic right foot/leg -Patient has chronic bilateral lower extremity sores, likely due to diabetes mellitus -Orthopedics consultation appreciated -Status post right AKA -Was on vanc and zosyn  -Continue wound care -Patient will likely be discharged to SNF  Post op fever/  Sepsis secondary to possible HCAP -Currently afebrile however had a fever of 101.3 on 09/14/2014 -Leukocytosis imroved (however antibiotics discontinued 09/17/2014) -Blood cultures 09/15/2014 show no growth to date -Strep pneumo and legionella urine antigens neg -Repeat CXR 7/30: suspected multifocal pna in B/L upper lobes and RLL -Patient received vanc and zosyn for 10 days (strange that HCAP would no resolve) -Spoke with ID, Dr. 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 Continue to monitor CBC  Sclerotic lesion on ribs on CXR -patient has sclerotic lesions on the ribs on CXR, -Dr [name] is aware of this finding as per his last note in epic. He will follow the patient as outpatient.  Hypothyroid -Continue synthroid 125 mcg  History of prostate cancer-15 yrs ago -stable   Essential hypertension -Stable, continue hydralazine 100 mg tablet, clonidine patch 0.1 mg  Dyslipidemia -continue statin   CAD, moderate -pt denies any chest pain.    Obesity  -Body mass index is 32.1 kg/(m^2).  Code Status: DNR   Family Communication:  Son at bedside  Disposition Plan: Admitted.  Continue to monitor and diuresis.  Pending repeat PT eval.   Time Spent in minutes   30 minutes  Procedures  Right AKA  Consults   Orthopedic surgery Dr. Linus Salmons, ID, via phone Nephrology Palliative care Dr. Haroldine Laws, via phone  DVT Prophylaxis  Heparin  Lab Results  Component Value Date   PLT 566* 09/20/2014    Medications  Scheduled Meds: . allopurinol  100 mg Oral QHS  . amiodarone  100 mg Oral Daily  . amLODipine  10 mg Oral Daily  . aspirin EC  81 mg Oral  Daily  . atorvastatin  40 mg Oral q1800  . citalopram  20 mg Oral Daily  . feeding supplement (GLUCERNA SHAKE)  237 mL Oral BID BM  . furosemide  120 mg Intravenous 3 times per day  . heparin  5,000 Units Subcutaneous 3 times per day  . hydrALAZINE  100 mg Oral TID  . insulin aspart  0-9 Units Subcutaneous TID WC  . insulin detemir  30 Units Subcutaneous Daily  . isosorbide mononitrate  60 mg Oral Daily  . levothyroxine  125 mcg Oral QAC breakfast  . pantoprazole  40 mg Oral Daily  . silver sulfADIAZINE   Topical Daily  . sodium chloride  3 mL Intravenous Q12H   Continuous Infusions: . sodium chloride 10 mL/hr at 09/13/14 0111  . sodium chloride 10 mL/hr at 09/13/14 2007   PRN Meds:.sodium chloride, acetaminophen **OR** acetaminophen, albuterol, ALPRAZolam, methocarbamol **OR** methocarbamol (ROBAXIN)  IV, metoCLOPramide **OR** metoCLOPramide (REGLAN) injection, morphine injection, ondansetron **OR** ondansetron (ZOFRAN) IV, oxyCODONE, sodium chloride, zolpidem  Antibiotics    Anti-infectives    Start     Dose/Rate Route Frequency Ordered Stop   09/15/14 1600  piperacillin-tazobactam (ZOSYN) IVPB 2.25 g  Status:  Discontinued     2.25 g 100 mL/hr over 30 Minutes Intravenous Every 8 hours 09/15/14 0923 09/17/14 1258   09/14/14 0600  ceFAZolin (ANCEF) IVPB 2 g/50 mL premix  Status:  Discontinued     2 g 100 mL/hr over 30 Minutes Intravenous On call to O.R. 09/13/14 1710 09/13/14 1946   09/13/14 1711  ceFAZolin (ANCEF) 2-3 GM-% IVPB SOLR    CommentsAra Kussmaul   : cabinet override      09/13/14 1711 09/14/14 0529   09/09/14 1000  vancomycin (VANCOCIN) 1,500 mg in sodium chloride 0.9 % 500 mL IVPB  Status:  Discontinued     1,500 mg 250 mL/hr over 120 Minutes Intravenous Every 48 hours 09/07/14 0849 09/15/14 1116   09/07/14 1600  piperacillin-tazobactam (ZOSYN) IVPB 3.375 g  Status:  Discontinued     3.375 g 12.5 mL/hr over 240 Minutes Intravenous Every 8 hours 09/07/14  0849 09/15/14 0923   09/07/14 0900  vancomycin (VANCOCIN) 2,250 mg in sodium chloride 0.9 % 500 mL IVPB     2,250 mg 250 mL/hr over 120 Minutes Intravenous  Once 09/07/14 0820 09/07/14 1141   09/07/14 0900  piperacillin-tazobactam (ZOSYN) IVPB 3.375 g     3.375 g 100 mL/hr over 30 Minutes Intravenous  Once 09/07/14 0849 09/07/14 1011      Subjective:   Chalmers Cater Vanbrocklin seen and examined today.  Patient has no complaints this morning. Patient denies chest pain, shortness of breath, abdominal pain, cough, dizziness, headache.  Objective:   Filed Vitals:   09/20/14 1710 09/20/14 2038 09/21/14 0538 09/21/14 0736  BP: 140/50 138/54 129/46 118/37  Pulse: 62 57 58 57  Temp: 98.6 F (37 C) 97.7 F (36.5 C) 97.9 F (36.6 C) 98.3 F (36.8 C)  TempSrc: Oral Axillary Oral Oral  Resp: 20 18 16 19   Height:      Weight:      SpO2: 98% 95% 91% 92%    Wt Readings from Last 3 Encounters:  09/19/14 107.8 kg (237 lb 10.5 oz)  06/22/14 110.678 kg (244 lb)  06/09/14 112.855 kg (248 lb 12.8 oz)     Intake/Output Summary (Last 24 hours) at 09/21/14 1328 Last data filed at 09/21/14 6812  Gross per 24 hour  Intake    430 ml  Output      2 ml  Net    428 ml    Exam  General: Well developed, well nourished, no distress  HEENT: NCAT, mucous membranes moist.   Cardiovascular: S1 S2 auscultated, no murmurs  Respiratory: Diminished breath sounds  Abdomen: Soft, obese, nontender, nondistended, + bowel sounds  Extremities: R AKA, LLE unna boot  Data Review   Micro Results Recent Results (from the past 240 hour(s))  Culture, blood (routine x 2)     Status: None   Collection Time: 09/15/14  3:20 PM  Result Value Ref Range Status   Specimen Description BLOOD LEFT ARM  Final   Special Requests BOTTLES DRAWN AEROBIC AND ANAEROBIC 10CC  Final   Culture NO GROWTH 5 DAYS  Final   Report Status 09/20/2014 FINAL  Final  Culture, blood (routine x 2)     Status: None   Collection Time:  09/15/14  3:30 PM  Result Value Ref Range Status   Specimen Description BLOOD LEFT HAND  Final   Special Requests BOTTLES DRAWN AEROBIC ONLY 3CC  Final   Culture NO GROWTH 5 DAYS  Final   Report Status 09/20/2014 FINAL  Final  Culture, Urine     Status: None   Collection Time: 09/16/14  1:44 AM  Result Value Ref Range Status   Specimen Description URINE, RANDOM  Final   Special Requests NONE  Final   Culture 30,000 COLONIES/mL YEAST  Final   Report Status 09/17/2014 FINAL  Final    Radiology Reports Dg Chest 1 View  09/07/2014   CLINICAL DATA:  Fluid overload, history CHF, COPD, hypertension, diabetes mellitus, prostate cancer  EXAM: CHEST  1 VIEW  COMPARISON:  Portable exam 1902 hours compared to 09/06/2014  FINDINGS: Enlargement of cardiac silhouette.  Atherosclerotic calcification aorta.  Mediastinal contour stable.  Diffuse pulmonary infiltrates question edema versus less likely infection, increased versus previous study.  No pleural effusion or pneumothorax.  Sclerotic foci at the proximal LEFT humerus question related to metastatic disease  IMPRESSION: Enlargement of cardiac silhouette with diffuse BILATERAL infiltrates favoring pulmonary edema over infection.  Sclerotic foci at the proximal LEFT humerus cannot exclude sclerotic osseous metastases in this patient with a history of prostate cancer.   Electronically Signed   By: Lavonia Dana M.D.   On: 09/07/2014 20:02   Dg Chest 2 View  09/09/2014   CLINICAL DATA:  81 year old male with fever, cough and congestion  EXAM: CHEST  2 VIEW  COMPARISON:  Prior chest x-ray 09/07/2014  FINDINGS: Overall, there has been some improvement in diffuse bilateral interstitial and airspace opacities. However, there remains some focal airspace opacification within the right mid lung. No pleural effusion. No pneumothorax. Stable cardiomegaly and mediastinal contours. Atherosclerotic calcification remains present within the transverse aorta.  IMPRESSION: 1.  Improving pulmonary edema. 2. Residual patchy airspace opacity in  the right mid lung in the region of the minor fissure may represent superimposed infiltrate/pneumonia, or perhaps some fluid trapped within the minor fissure. 3. Stable cardiomegaly.   Electronically Signed   By: Jacqulynn Cadet M.D.   On: 09/09/2014 10:21   US Renal  09/17/2014   CLINICAL DATA:  Acute renal insufficiency  EXAM: RENAL / URINARY TRACT ULTRASOUND COMPLETE  COMPARISON:  None.  FINDINGS: Right Kidney:  Length: 10.2 cm. Echogenicity within normal limits. No mass or hydronephrosis visualized.  Left Kidney:  Length: 10.5 cm.  Limited views, negative for hydronephrosis.  Bladder:  Not seen.  IMPRESSION: Negative for hydronephrosis.  Limited views of the left kidney.   Electronically Signed   By: Andreas Newport M.D.   On: 09/17/2014 02:44   Nm Pulmonary Perf And Vent  09/08/2014   CLINICAL DATA:  Shortness of breath.  EXAM: NUCLEAR MEDICINE VENTILATION - PERFUSION LUNG SCAN  TECHNIQUE: Ventilation images were obtained in multiple projections using inhaled aerosol Tc-90m DTPA. Perfusion images were obtained in multiple projections after intravenous injection of Tc-49m MAA.  RADIOPHARMACEUTICALS:  39.2 Technetium-74m DTPA aerosol inhalation and 6.45 Technetium-55m MAA IV  COMPARISON:  Chest x-ray 09/07/2014.  FINDINGS: Ventilation: Severe bilateral ventilatory defects. No segmental sized perfusion defects noted.  Perfusion: No wedge shaped peripheral perfusion defects to suggest acute pulmonary embolism.  IMPRESSION: Severe bilateral ventilatory defects consistent with bilateral airspace disease noted on recent chest x-ray. No perfusion defects noted to suggest pulmonary embolus. Low probability scan for pulmonary embolus.   Electronically Signed   By: Marcello Moores  Register   On: 09/08/2014 12:12   Dg Chest Port 1 View  09/18/2014   CLINICAL DATA:  Leukocytosis  EXAM: PORTABLE CHEST - 1 VIEW  COMPARISON:  09/15/2014  FINDINGS:  Multifocal patchy opacities in the right upper and lower lobes and left prior hilar region, suspicious for multifocal pneumonia, less likely interstitial edema. No definite pleural effusions. No pneumothorax.  The heart is top-normal in size.  IMPRESSION: Suspected multifocal pneumonia in the bilateral upper lobes and right lower lobe, grossly unchanged.   Electronically Signed   By: Julian Hy M.D.   On: 09/18/2014 08:46   Dg Chest Port 1 View  09/15/2014   CLINICAL DATA:  Fever and difficulty breathing  EXAM: PORTABLE CHEST - 1 VIEW  COMPARISON:  September 09, 2014  FINDINGS: There is generalized interstitial edema. There remains slight alveolar consolidation in the mid right lung, less than on recent prior study. Heart is enlarged. The pulmonary vascular is within normal limits. No adenopathy.  IMPRESSION: Findings consistent with congestive heart failure. A small focus of pneumonia in the right mid lung cannot be excluded.   Electronically Signed   By: Lowella Grip III M.D.   On: 09/15/2014 16:53   Dg Chest Portable 1 View  09/06/2014   CLINICAL DATA:  Lower extremity weakness  EXAM: PORTABLE CHEST - 1 VIEW  COMPARISON:  March 10, 2014  FINDINGS: There is scarring in both mid lung regions. There is no frank edema or consolidation. Heart is mildly enlarged with pulmonary vascular within normal limits. There is atherosclerotic change in aorta. The bony structures appear stable compared to prior study without focal lesion. Several bony structures appear rather sclerotic a particularly in the medial proximal right humerus, incompletely visualized on this study.  IMPRESSION: Areas of lung scarring. No frank edema or consolidation. Several bones appear somewhat sclerotic. This finding may warrant prostate evaluation given the propensity of prostate carcinoma to present  with sclerotic bony metastases.   Electronically Signed   By: Lowella Grip III M.D.   On: 09/06/2014 09:20   Dg Foot Complete  Right  09/06/2014   CLINICAL DATA:  Right foot pain, wound.  EXAM: RIGHT FOOT COMPLETE - 3+ VIEW  COMPARISON:  None.  FINDINGS: Soft tissue swelling within the right foot. No acute bony abnormality. No acute fracture, subluxation or dislocation. Possible old healed fractures in the proximal second and third metatarsals. No soft tissue gas.  IMPRESSION: No acute bony abnormality.   Electronically Signed   By: Rolm Baptise M.D.   On: 09/06/2014 14:55    CBC  Recent Labs Lab 09/16/14 0502 09/17/14 0523 09/18/14 0453 09/19/14 0504 09/20/14 0450  WBC 19.7* 19.4* 16.4* 18.1* 17.4*  HGB 8.2* 8.1* 7.4* 8.9* 8.6*  HCT 26.2* 26.1* 24.7* 29.0* 28.4*  PLT 471* 492* 538* 529* 566*  MCV 80.6 81.3 81.8 83.8 83.5  MCH 25.2* 25.2* 24.5* 25.7* 25.3*  MCHC 31.3 31.0 30.0 30.7 30.3  RDW 20.2* 20.2* 20.5* 20.5* 20.9*    Chemistries   Recent Labs Lab 09/16/14 0502 09/17/14 0523 09/18/14 0453 09/19/14 0504 09/20/14 0450  NA 139 137 141 141 142  K 3.8 4.0 4.0 4.0 3.8  CL 104 105 109 106 110  CO2 22 21* 20* 19* 21*  GLUCOSE 172* 117* 83 77 106*  BUN 73* 73* 79* 84* 84*  CREATININE 5.08* 4.97* 4.90* 4.68* 4.35*  CALCIUM 8.0* 7.9* 8.0* 8.4* 8.2*   ------------------------------------------------------------------------------------------------------------------ estimated creatinine clearance is 16.8 mL/min (by C-G formula based on Cr of 4.35). ------------------------------------------------------------------------------------------------------------------ No results for input(s): HGBA1C in the last 72 hours. ------------------------------------------------------------------------------------------------------------------ No results for input(s): CHOL, HDL, LDLCALC, TRIG, CHOLHDL, LDLDIRECT in the last 72 hours. ------------------------------------------------------------------------------------------------------------------ No results for input(s): TSH, T4TOTAL, T3FREE, THYROIDAB in the last 72  hours.  Invalid input(s): FREET3 ------------------------------------------------------------------------------------------------------------------ No results for input(s): VITAMINB12, FOLATE, FERRITIN, TIBC, IRON, RETICCTPCT in the last 72 hours.  Coagulation profile No results for input(s): INR, PROTIME in the last 168 hours.  No results for input(s): DDIMER in the last 72 hours.  Cardiac Enzymes No results for input(s): CKMB, TROPONINI, MYOGLOBIN in the last 168 hours.  Invalid input(s): CK ------------------------------------------------------------------------------------------------------------------ Invalid input(s): POCBNP    Kerby Hockley D.O. on 09/21/2014 at 1:28 PM  Between 7am to 7pm - Pager - [phone]  After 7pm go to [email or web address] - password [email or web address]  And look for the night coverage person covering for me after hours  [facility] Office  [phone]

## 2014-09-21 NOTE — Progress Notes (Signed)
Nutrition Follow-up  DOCUMENTATION CODES:   Obesity unspecified  INTERVENTION:   Provide Nepro Shake po BID, each supplement provides 425 kcal and 19 grams protein  Encourage adequate PO intake.   NUTRITION DIAGNOSIS:   Increased nutrient needs related to wound healing as evidenced by estimated needs; ongoing  GOAL:   Patient will meet greater than or equal to 90% of their needs; progressing  MONITOR:   PO intake, Supplement acceptance, Weight trends, Labs, I & O's  REASON FOR ASSESSMENT:   Malnutrition Screening Tool    ASSESSMENT:   81 yr old male whohas a past medical history of CHF; arrhythmia; stroke; hyperlipidemia; HTN; hypertrophy of prostate without urinary obstruction and other lower urinary tract symptoms (LUTS); osteoarthrosis; Type II or unspecified type diabetes mellitus without mention of complication; AV block, Mobitz 1; obstructive sleep apnea; iron deficiency anemia; CVA; renal insufficiency; obesity; allergic rhinitis; CAD; normochromic normocytic anemia; abnormal CXR (04/21/2013); 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 Worsening pain in left and right lower legs has been constantly aching for approximately 1 year and is relieved by rest and worsened by activity.   Meal completion has been varied from 0-100%. Pt has been consuming his glucerna shakes. RD to modify orders and order Nepro Shake instead as pt with CKD stage 5. Plans to re-evaluate pt if HD is possible for him. Continue to encourage adequate PO intake.   Diet Order:  Diet Carb Modified Fluid consistency:: Thin; Room service appropriate?: Yes  Skin:  Wound (see comment) (Stage II pressure ulcer on sacrum, wound on leg)  Last BM:  8/2  Height:   Ht Readings from Last 1 Encounters:  09/19/14 5' 10.5" (1.791 m)    Weight:   Wt Readings  from Last 1 Encounters:  09/19/14 237 lb 10.5 oz (107.8 kg)    Ideal Body Weight:  76.82 kg (kg)  BMI:  Body mass index is 33.61 kg/(m^2).  Estimated Nutritional Needs:   Kcal:  2000-2200  Protein:  85-105 grams  Fluid:  Per MD  EDUCATION NEEDS:   No education needs identified at this time  Corrin Parker, MS, RD, LDN Pager # [phone] After hours/ weekend pager # [phone]

## 2014-09-22 DIAGNOSIS — N189 Chronic kidney disease, unspecified: Secondary | ICD-10-CM

## 2014-09-22 DIAGNOSIS — E1129 Type 2 diabetes mellitus with other diabetic kidney complication: Secondary | ICD-10-CM

## 2014-09-22 DIAGNOSIS — N179 Acute kidney failure, unspecified: Secondary | ICD-10-CM | POA: Insufficient documentation

## 2014-09-22 DIAGNOSIS — I480 Paroxysmal atrial fibrillation: Secondary | ICD-10-CM

## 2014-09-22 LAB — BASIC METABOLIC PANEL
Anion gap: 10 (ref 5–15)
BUN: 81 mg/dL — AB (ref 6–20)
CO2: 23 mmol/L (ref 22–32)
Calcium: 8.4 mg/dL — ABNORMAL LOW (ref 8.9–10.3)
Chloride: 112 mmol/L — ABNORMAL HIGH (ref 101–111)
Creatinine, Ser: 3.77 mg/dL — ABNORMAL HIGH (ref 0.61–1.24)
GFR calc Af Amer: 16 mL/min — ABNORMAL LOW (ref >60)
GFR calc non Af Amer: 14 mL/min — ABNORMAL LOW (ref >60)
Glucose, Bld: 115 mg/dL — ABNORMAL HIGH (ref 65–99)
Potassium: 3.4 mmol/L — ABNORMAL LOW (ref 3.5–5.1)
Sodium: 145 mmol/L (ref 135–145)

## 2014-09-22 LAB — CBC
HEMATOCRIT: 31.2 % — AB (ref 39.0–52.0)
HEMOGLOBIN: 9.5 g/dL — AB (ref 13.0–17.0)
MCH: 25.9 pg — ABNORMAL LOW (ref 26.0–34.0)
MCHC: 30.4 g/dL (ref 30.0–36.0)
MCV: 85 fL (ref 78.0–100.0)
Platelets: 568 10*3/uL — ABNORMAL HIGH (ref 150–400)
RBC: 3.67 MIL/uL — ABNORMAL LOW (ref 4.22–5.81)
RDW: 22.1 % — ABNORMAL HIGH (ref 11.5–15.5)
WBC: 16.5 10*3/uL — AB (ref 4.0–10.5)

## 2014-09-22 LAB — GLUCOSE, CAPILLARY
GLUCOSE-CAPILLARY: 104 mg/dL — AB (ref 65–99)
Glucose-Capillary: 107 mg/dL — ABNORMAL HIGH (ref 65–99)
Glucose-Capillary: 146 mg/dL — ABNORMAL HIGH (ref 65–99)
Glucose-Capillary: 107 mg/dL — ABNORMAL HIGH (ref 65–99)

## 2014-09-22 MED ORDER — POTASSIUM CHLORIDE ER 10 MEQ PO TBCR
10.0000 meq | EXTENDED_RELEASE_TABLET | Freq: Every day | ORAL | Status: DC
  Administered 2014-09-22 – 2014-09-23 (×2): 10 meq via ORAL
  Filled 2014-09-22 (×2): qty 1
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
## 2014-09-22 NOTE — Evaluation (Signed)
Physical Therapy Re-Evaluation Patient Details Name: Travis PROUTY Sr. MRN: [id number] DOB: 1932/10/04 Today's Date: 09/22/2014   History of Present Illness  81 yo male brought to ED 09/06/14 with worseneing wounds of both legs, inability to care for self. H/O CHF, DM, CVA, myelodysplasticsyndrome, chronic ulcers of bith legs. Now s/p R LE AKA  Clinical Impression  Pt admitted with above diagnosis. Pt currently with functional limitations due to the deficits listed below (see PT Problem List).  Pt will benefit from skilled PT to increase their independence and safety with mobility to allow discharge to the venue listed below.    [name] was more alert sitting up than last session, and it seems that he enjoys being out of bed; He takes more time to process, but is more interactive and has participated more and tolerated more this session than previous sessions; As long as PT has a place in [name] Goals of Care, further PT at SNF is indicated to maximize independence and safety with mobility and to decr caregiver burden.     Follow Up Recommendations SNF;Supervision/Assistance - 24 hour    Equipment Recommendations  None recommended by PT    Recommendations for Other Services       Precautions / Restrictions Precautions Precautions: Fall Restrictions Weight Bearing Restrictions: No      Mobility  Bed Mobility Overal bed mobility: Needs Assistance;+2 for physical assistance Bed Mobility: Supine to Sit     Supine to sit: Max assist;+2 for physical assistance     General bed mobility comments: Initiated pivot-on-hips transfer to EOB with hospital bed elevated and use fo bed pad to square off hips at EOB; Assist for LLE for support while clearing EOB; posterior support given at trunk as well, especially due to posterior lean initially upon sitting  Transfers Overall transfer level: Needs assistance Equipment used:  (bed pad) Transfers: Lateral/Scoot Transfers           Lateral/Scoot Transfers: +2 physical assistance;Total assist (third person (son, Archie) helpful to stabilize chair) General transfer comment: Used bed pad to cradle hips during transfer OOB to drop-arm recliner; blocked L knee  Ambulation/Gait                Stairs            Wheelchair Mobility    Modified Rankin (Stroke Patients Only)       Balance     Sitting balance-Leahy Scale: Poor (approaching Fair) Sitting balance - Comments: Sat EOB initially needing assist to keep balance, but progressing to minguard assist; Pt with minimal weight shift outside of base of support, but able to hug son with support of UEs; able to weight shift anteriorly enough to reach and touch far armrest of reclner, though did not tolerate this position for long; min to mod assist to pull from supported to unsupported sitting in recliner Postural control: Posterior lean                                   Pertinent Vitals/Pain Pain Assessment: Faces Faces Pain Scale: Hurts even more Pain Location: R residual limb Pain Descriptors / Indicators: Aching;Discomfort;Grimacing Pain Intervention(s): Limited activity within patient's tolerance;Monitored during session;Repositioned    Home Living Family/patient expects to be discharged to:: Skilled nursing facility                 Additional Comments: Per OT note, as of 6  months ago, "Pt uses w/c for all mobility. Reports he was able to perform stand pivot transfers but has not ambulated in about a year."    Prior Function Level of Independence: Needs assistance   Gait / Transfers Assistance Needed: Independent with transfers w/c level. No gait per pt report x 1 year (prior to this admission)  ADL's / Homemaking Assistance Needed: Pt reports having a CNA come in the mornings to assist with ADLs. Has hired assistance for housekeeping.  Pt reports he was mod I with w/c <> toilet transfers and toileting  until recently.    Comments: Pt reports going to [facility] daily during the week (bus would pick him up and drop him off) for exercises, Bingo, socialization.  (Per OT note from 6 months ago)     Hand Dominance   Dominant Hand: Right    Extremity/Trunk Assessment   Upper Extremity Assessment: Generalized weakness           Lower Extremity Assessment: RLE deficits/detail;LLE deficits/detail RLE Deficits / Details: High AKA; any movement of residual limb elicits pain and grimace LLE Deficits / Details: Lower leg wrapped in dressing; initiates hip and knee flesion with tactile cueing, needs active assistane to move through limited hip and knee range (Of note, I was mistaken in my last eval note: the AKA is on the Right)     Communication   Communication: Expressive difficulties (had recently recieved pain meds)  Cognition Arousal/Alertness: Lethargic (Lethargic laying in bed upon arrival, but more awake, alert, and interactive once upright) Behavior During Therapy: WFL for tasks assessed/performed Overall Cognitive Status: Impaired/Different from baseline Area of Impairment: Attention;Following commands;Problem solving   Current Attention Level: Sustained   Following Commands: Follows one step commands inconsistently;Follows one step commands with increased time     Problem Solving: Slow processing;Decreased initiation;Difficulty sequencing;Requires verbal cues General Comments: More awake and alert once sitting upright on EOB and in the recliner    General Comments General comments (skin integrity, edema, etc.): Pt took his )2 off as we were getting him settled in the recliner; His son stated he would get [name]'s nasal cannula back in    Exercises        Assessment/Plan    PT Assessment Patient needs continued PT services  PT Diagnosis Generalized weakness;Acute pain   PT Problem List Decreased strength;Decreased activity tolerance;Decreased balance;Decreased  mobility;Decreased safety awareness;Decreased knowledge of use of DME;Pain;Decreased skin integrity  PT Treatment Interventions DME instruction;Functional mobility training;Therapeutic activities;Patient/family education;Therapeutic exercise   PT Goals (Current goals can be found in the Care Plan section) Acute Rehab PT Goals Patient Stated Goal: not stated, but seemed happy to be OOB PT Goal Formulation: Patient unable to participate in goal setting Time For Goal Achievement: 10/06/14 Potential to Achieve Goals: Fair    Frequency Min 2X/week   Barriers to discharge        Co-evaluation               End of Session Equipment Utilized During Treatment: Oxygen;Gait belt (bed pad) Activity Tolerance: Patient tolerated treatment well;Patient limited by pain Patient left: in chair;with call bell/phone within reach;with chair alarm set;Other (comment) (Maximove pad underneath him in prep for getting back to bed) Nurse Communication: Mobility status;Need for lift equipment         Time: [phone] PT Time Calculation (min) (ACUTE ONLY): 35 min   Charges:   PT Evaluation $PT Re-evaluation: 1 Procedure PT Treatments $Therapeutic Activity: 8-22 mins   PT  G Codes:        Roney Marion [facility] 09/22/2014, 10:51 AM  Roney Marion, [place] Pager [phone] Office [phone]

## 2014-09-22 NOTE — Progress Notes (Deleted)
High fall risk

## 2014-09-22 NOTE — Progress Notes (Signed)
[facility]                                                                              Patient Demographics  [name], is a 81 y.o. male, DOB - [birth year], PFX:[id number]  Admit date - 09/06/2014   Admitting Physician Oswald Hillock, MD  Outpatient Primary MD for the patient is [name], MD  LOS - 78   Chief Complaint  Patient presents with  . Leg Swelling      Interim history 81 year old male with history of CKD, CHF presented with worsening edema and difficulty ambulating. Patient is s/p RAKA.  Developed worsening kidney function as well as CHF exacerbation. Currently on diuresis. Patient found to not be a good candidate for dialysis. Patient's family had decided on hospice however daughter has now changed her mind yet again. Pending repeat PT evaluation and possible SNF placement.  Assessment & Plan   Acute on CKD stage 4 -Nephrology consulted and appreciated  -Continue lasix -Cr 4.3  -Renal US: negative hydronephrosis - Continue to monitor serum creatinine  Acute Diastolic CHF exacerbation -Echocardiogram 03/11/2014 showed an EF of 55-60% -patient received IVF due to AKI -IVF discontinued and placed back on diuretics -Patient is currently on torsemide 80 mg by mouth twice a day -Will attempt to monitor output as patient is incontinent and condom cath does not stay in place  Necrotic right foot/leg -Patient has chronic bilateral lower extremity sores, likely due to diabetes mellitus -Orthopedics consultation appreciated -Status post right AKA -Was on vanc and zosyn currently off IV antibiotics -Continue wound care -Patient will likely be discharged to SNF  Post op fever/ Sepsis secondary to possible HCAP -Currently afebrile however had a fever of 101.3 on 09/14/2014 -Leukocytosis imroved (however antibiotics discontinued 09/17/2014) -Blood cultures 09/15/2014 show no growth to date -Strep pneumo and legionella urine antigens neg -Repeat CXR 7/30:  suspected multifocal pna in B/L upper lobes and RLL -Patient received vanc and zosyn for 10 days (strange that HCAP would no resolve) -My associate spoke with ID, Dr. 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 Continue to monitor CBC  Sclerotic lesion on ribs on CXR -patient has sclerotic lesions on the ribs on CXR, -Dr [name] is aware of this finding as per his last note in epic. He will follow the patient as outpatient.  Hypothyroid -Continue synthroid 125 mcg  History of prostate cancer-15 yrs ago -stable   Essential hypertension -Stable, continue hydralazine 100 mg tablet, clonidine patch 0.1 mg  Dyslipidemia -continue statin   CAD, moderate -pt denies any chest pain.    Obesity  -Body mass index is 32.1 kg/(m^2).  Code Status: DNR   Family Communication: Son at bedside  Disposition Plan: Admitted.  Continue to monitor and diuresis.  Pending repeat PT eval.   Time Spent in minutes   30 minutes  Procedures  Right AKA  Consults  Orthopedic surgery Dr. Linus Salmons, ID, via phone Nephrology Palliative care Dr. Haroldine Laws, via phone  DVT Prophylaxis  Heparin  Lab Results  Component Value Date   PLT 568* 09/22/2014    Medications  Scheduled Meds: . allopurinol  100 mg Oral QHS  . amiodarone  100 mg Oral Daily  . amLODipine  10 mg Oral Daily  . aspirin EC  81 mg Oral Daily  . atorvastatin  40 mg Oral q1800  . citalopram  20 mg Oral Daily  . feeding supplement (NEPRO CARB STEADY)  237 mL Oral BID BM  . heparin  5,000 Units Subcutaneous 3 times per day  . hydrALAZINE  100 mg Oral TID  . insulin aspart   0-9 Units Subcutaneous TID WC  . insulin detemir  30 Units Subcutaneous Daily  . isosorbide mononitrate  60 mg Oral Daily  . levothyroxine  125 mcg Oral QAC breakfast  . metolazone  5 mg Oral Daily  . pantoprazole  40 mg Oral Daily  . silver sulfADIAZINE   Topical Daily  . sodium chloride  3 mL Intravenous Q12H  . torsemide  80 mg Oral BID   Continuous Infusions: . sodium chloride 10 mL/hr at 09/13/14 0111  . sodium chloride 10 mL/hr at 09/13/14 2007   PRN Meds:.sodium chloride, acetaminophen **OR** acetaminophen, albuterol, ALPRAZolam, methocarbamol **OR** methocarbamol (ROBAXIN)  IV, metoCLOPramide **OR** metoCLOPramide (REGLAN) injection, morphine injection, ondansetron **OR** ondansetron (ZOFRAN) IV, oxyCODONE, sodium chloride, zolpidem  Antibiotics    Anti-infectives    Start     Dose/Rate Route Frequency Ordered Stop   09/15/14 1600  piperacillin-tazobactam (ZOSYN) IVPB 2.25 g  Status:  Discontinued     2.25 g 100 mL/hr over 30 Minutes Intravenous Every 8 hours 09/15/14 0923 09/17/14 1258   09/14/14 0600  ceFAZolin (ANCEF) IVPB 2 g/50 mL premix  Status:  Discontinued     2 g 100 mL/hr over 30 Minutes Intravenous On call to O.R. 09/13/14 1710 09/13/14 1946   09/13/14 1711  ceFAZolin (ANCEF) 2-3 GM-% IVPB SOLR    Comments:  Ara Kussmaul   : cabinet override      09/13/14 1711 09/14/14 0529   09/09/14 1000  vancomycin (VANCOCIN) 1,500 mg in sodium chloride 0.9 % 500 mL IVPB  Status:  Discontinued     1,500 mg 250 mL/hr over 120 Minutes Intravenous Every 48 hours 09/07/14 0849 09/15/14 1116   09/07/14 1600  piperacillin-tazobactam (ZOSYN) IVPB 3.375 g  Status:  Discontinued     3.375 g 12.5 mL/hr over 240 Minutes Intravenous Every 8 hours 09/07/14 0849 09/15/14 0923   09/07/14 0900  vancomycin (VANCOCIN) 2,250 mg in sodium chloride 0.9 % 500 mL IVPB     2,250 mg 250 mL/hr over 120 Minutes Intravenous  Once 09/07/14 0820 09/07/14 1141   09/07/14 0900   piperacillin-tazobactam (ZOSYN) IVPB 3.375 g     3.375 g 100 mL/hr over 30 Minutes Intravenous  Once 09/07/14 0849 09/07/14 1011      Subjective:   [name] no acute issues reported overnight  Objective:   Filed Vitals:   09/21/14 2123 09/22/14 0519 09/22/14 0954 09/22/14 1000  BP: 141/52 138/51 129/55 112/52  Pulse: 61 57 58   Temp: 99.3 F (37.4 C) 98.7 F (37.1 C) 98.4 F (36.9 C)   TempSrc: Oral Oral Oral   Resp: 18 20 20    Height:      Weight:   107.9 kg (237 lb 14 oz)   SpO2: 98% 93% 96%  Wt Readings from Last 3 Encounters:  09/22/14 107.9 kg (237 lb 14 oz)  06/22/14 110.678 kg (244 lb)  06/09/14 112.855 kg (248 lb 12.8 oz)     Intake/Output Summary (Last 24 hours) at 09/22/14 1709 Last data filed at 09/22/14 1300  Gross per 24 hour  Intake    662 ml  Output   1853 ml  Net  -1191 ml    Exam  General: Well developed, well nourished, no distress  HEENT: NCAT, mucous membranes moist.   Cardiovascular: S1 S2 auscultated, no murmurs  Respiratory: Diminished breath sounds, no wheezes  Abdomen: Soft, obese, nontender, nondistended, + bowel sounds  Extremities: R AKA, LLE unna boot  Data Review   Micro Results Recent Results (from the past 240 hour(s))  Culture, blood (routine x 2)     Status: None   Collection Time: 09/15/14  3:20 PM  Result Value Ref Range Status   Specimen Description BLOOD LEFT ARM  Final   Special Requests BOTTLES DRAWN AEROBIC AND ANAEROBIC 10CC  Final   Culture NO GROWTH 5 DAYS  Final   Report Status 09/20/2014 FINAL  Final  Culture, blood (routine x 2)     Status: None   Collection Time: 09/15/14  3:30 PM  Result Value Ref Range Status   Specimen Description BLOOD LEFT HAND  Final   Special Requests BOTTLES DRAWN AEROBIC ONLY 3CC  Final   Culture NO GROWTH 5 DAYS  Final   Report Status 09/20/2014 FINAL  Final  Culture, Urine     Status: None   Collection Time: 09/16/14  1:44 AM  Result Value Ref Range Status     Specimen Description URINE, RANDOM  Final   Special Requests NONE  Final   Culture 30,000 COLONIES/mL YEAST  Final   Report Status 09/17/2014 FINAL  Final    Radiology Reports Dg Chest 1 View  09/07/2014   CLINICAL DATA:  Fluid overload, history CHF, COPD, hypertension, diabetes mellitus, prostate cancer  EXAM: CHEST  1 VIEW  COMPARISON:  Portable exam 1902 hours compared to 09/06/2014  FINDINGS: Enlargement of cardiac silhouette.  Atherosclerotic calcification aorta.  Mediastinal contour stable.  Diffuse pulmonary infiltrates question edema versus less likely infection, increased versus previous study.  No pleural effusion or pneumothorax.  Sclerotic foci at the proximal LEFT humerus question related to metastatic disease  IMPRESSION: Enlargement of cardiac silhouette with diffuse BILATERAL infiltrates favoring pulmonary edema over infection.  Sclerotic foci at the proximal LEFT humerus cannot exclude sclerotic osseous metastases in this patient with a history of prostate cancer.   Electronically Signed   By: Lavonia Dana M.D.   On: 09/07/2014 20:02   Dg Chest 2 View  09/09/2014   CLINICAL DATA:  81 year old male with fever, cough and congestion  EXAM: CHEST  2 VIEW  COMPARISON:  Prior chest x-ray 09/07/2014  FINDINGS: Overall, there has been some improvement in diffuse bilateral interstitial and airspace opacities. However, there remains some focal airspace opacification within the right mid lung. No pleural effusion. No pneumothorax. Stable cardiomegaly and mediastinal contours. Atherosclerotic calcification remains present within the transverse aorta.  IMPRESSION: 1. Improving pulmonary edema. 2. Residual patchy airspace opacity in the right mid lung in the region of the minor fissure may represent superimposed infiltrate/pneumonia, or perhaps some fluid trapped within the minor fissure. 3. Stable cardiomegaly.   Electronically Signed   By: Jacqulynn Cadet M.D.   On: 09/09/2014 10:21   US  Renal  09/17/2014   CLINICAL  DATA:  Acute renal insufficiency  EXAM: RENAL / URINARY TRACT ULTRASOUND COMPLETE  COMPARISON:  None.  FINDINGS: Right Kidney:  Length: 10.2 cm. Echogenicity within normal limits. No mass or hydronephrosis visualized.  Left Kidney:  Length: 10.5 cm.  Limited views, negative for hydronephrosis.  Bladder:  Not seen.  IMPRESSION: Negative for hydronephrosis.  Limited views of the left kidney.   Electronically Signed   By: Andreas Newport M.D.   On: 09/17/2014 02:44   Nm Pulmonary Perf And Vent  09/08/2014   CLINICAL DATA:  Shortness of breath.  EXAM: NUCLEAR MEDICINE VENTILATION - PERFUSION LUNG SCAN  TECHNIQUE: Ventilation images were obtained in multiple projections using inhaled aerosol Tc-55m DTPA. Perfusion images were obtained in multiple projections after intravenous injection of Tc-76m MAA.  RADIOPHARMACEUTICALS:  39.2 Technetium-70m DTPA aerosol inhalation and 6.45 Technetium-2m MAA IV  COMPARISON:  Chest x-ray 09/07/2014.  FINDINGS: Ventilation: Severe bilateral ventilatory defects. No segmental sized perfusion defects noted.  Perfusion: No wedge shaped peripheral perfusion defects to suggest acute pulmonary embolism.  IMPRESSION: Severe bilateral ventilatory defects consistent with bilateral airspace disease noted on recent chest x-ray. No perfusion defects noted to suggest pulmonary embolus. Low probability scan for pulmonary embolus.   Electronically Signed   By: Marcello Moores  Register   On: 09/08/2014 12:12   Dg Chest Port 1 View  09/18/2014   CLINICAL DATA:  Leukocytosis  EXAM: PORTABLE CHEST - 1 VIEW  COMPARISON:  09/15/2014  FINDINGS: Multifocal patchy opacities in the right upper and lower lobes and left prior hilar region, suspicious for multifocal pneumonia, less likely interstitial edema. No definite pleural effusions. No pneumothorax.  The heart is top-normal in size.  IMPRESSION: Suspected multifocal pneumonia in the bilateral upper lobes and right lower lobe,  grossly unchanged.   Electronically Signed   By: Julian Hy M.D.   On: 09/18/2014 08:46   Dg Chest Port 1 View  09/15/2014   CLINICAL DATA:  Fever and difficulty breathing  EXAM: PORTABLE CHEST - 1 VIEW  COMPARISON:  September 09, 2014  FINDINGS: There is generalized interstitial edema. There remains slight alveolar consolidation in the mid right lung, less than on recent prior study. Heart is enlarged. The pulmonary vascular is within normal limits. No adenopathy.  IMPRESSION: Findings consistent with congestive heart failure. A small focus of pneumonia in the right mid lung cannot be excluded.   Electronically Signed   By: Lowella Grip III M.D.   On: 09/15/2014 16:53   Dg Chest Portable 1 View  09/06/2014   CLINICAL DATA:  Lower extremity weakness  EXAM: PORTABLE CHEST - 1 VIEW  COMPARISON:  March 10, 2014  FINDINGS: There is scarring in both mid lung regions. There is no frank edema or consolidation. Heart is mildly enlarged with pulmonary vascular within normal limits. There is atherosclerotic change in aorta. The bony structures appear stable compared to prior study without focal lesion. Several bony structures appear rather sclerotic a particularly in the medial proximal right humerus, incompletely visualized on this study.  IMPRESSION: Areas of lung scarring. No frank edema or consolidation. Several bones appear somewhat sclerotic. This finding may warrant prostate evaluation given the propensity of prostate carcinoma to present with sclerotic bony metastases.   Electronically Signed   By: Lowella Grip III M.D.   On: 09/06/2014 09:20   Dg Foot Complete Right  09/06/2014   CLINICAL DATA:  Right foot pain, wound.  EXAM: RIGHT FOOT COMPLETE - 3+ VIEW  COMPARISON:  None.  FINDINGS:  Soft tissue swelling within the right foot. No acute bony abnormality. No acute fracture, subluxation or dislocation. Possible old healed fractures in the proximal second and third metatarsals. No soft tissue  gas.  IMPRESSION: No acute bony abnormality.   Electronically Signed   By: Rolm Baptise M.D.   On: 09/06/2014 14:55    CBC  Recent Labs Lab 09/17/14 0523 09/18/14 0453 09/19/14 0504 09/20/14 0450 09/22/14 0604  WBC 19.4* 16.4* 18.1* 17.4* 16.5*  HGB 8.1* 7.4* 8.9* 8.6* 9.5*  HCT 26.1* 24.7* 29.0* 28.4* 31.2*  PLT 492* 538* 529* 566* 568*  MCV 81.3 81.8 83.8 83.5 85.0  MCH 25.2* 24.5* 25.7* 25.3* 25.9*  MCHC 31.0 30.0 30.7 30.3 30.4  RDW 20.2* 20.5* 20.5* 20.9* 22.1*    Chemistries   Recent Labs Lab 09/17/14 0523 09/18/14 0453 09/19/14 0504 09/20/14 0450 09/22/14 0604  NA 137 141 141 142 145  K 4.0 4.0 4.0 3.8 3.4*  CL 105 109 106 110 112*  CO2 21* 20* 19* 21* 23  GLUCOSE 117* 83 77 106* 115*  BUN 73* 79* 84* 84* 81*  CREATININE 4.97* 4.90* 4.68* 4.35* 3.77*  CALCIUM 7.9* 8.0* 8.4* 8.2* 8.4*   ------------------------------------------------------------------------------------------------------------------ estimated creatinine clearance is 19.4 mL/min (by C-G formula based on Cr of 3.77). ------------------------------------------------------------------------------------------------------------------ No results for input(s): HGBA1C in the last 72 hours. ------------------------------------------------------------------------------------------------------------------ No results for input(s): CHOL, HDL, LDLCALC, TRIG, CHOLHDL, LDLDIRECT in the last 72 hours. ------------------------------------------------------------------------------------------------------------------ No results for input(s): TSH, T4TOTAL, T3FREE, THYROIDAB in the last 72 hours.  Invalid input(s): FREET3 ------------------------------------------------------------------------------------------------------------------ No results for input(s): VITAMINB12, FOLATE, FERRITIN, TIBC, IRON, RETICCTPCT in the last 72 hours.  Coagulation profile No results for input(s): INR, PROTIME in the last 168  hours.  No results for input(s): DDIMER in the last 72 hours.  Cardiac Enzymes No results for input(s): CKMB, TROPONINI, MYOGLOBIN in the last 168 hours.  Invalid input(s): CK ------------------------------------------------------------------------------------------------------------------ Invalid input(s): POCBNP    Aidan Caloca D.O. on 09/22/2014 at 5:09 PM  Between 7am to 7pm - Pager - [phone]  After 7pm go to [email or web address] - password [email or web address]  And look for the night coverage person covering for me after hours  [facility] Office  [phone]

## 2014-09-22 NOTE — Progress Notes (Signed)
Patient does not have any peripheral IV and the doctor is aware of it, he is not getting any IV fluids and is almost ready to get discharge to the SNF.

## 2014-09-22 NOTE — Progress Notes (Signed)
Advanced Heart Failure Team Progress Note  Referring Physician: Mikhail Primary Cardiologist:  [place]  Reason for Consultation: HF/Renal failure  HPI:    [name] is an 81 year old man with diastolic CHF, CKD stage 5, HTN, BPH, COPD, h/o prostate ca, a-fib, CVA, DM, and myelodysplastic syndrome. 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 He was living independently at home prior to this hospitalization. He is wheel chair bound at baseline and could pivot. Since his hospitalization his kidney function has been worsening with peak Cr at 5.1. He has continued to have fluid overload He was seen by nephrology who felt he was not a good HD candidate due to comorbidities. Foruntately creatinine improving  Feels better. Denies dyspnea. Feels that urine output is picking up but weight unchanged.   Creatinine 4.35 -> 3.7  . allopurinol  100 mg Oral QHS  . amiodarone  100 mg Oral Daily  . amLODipine  10 mg Oral Daily  . aspirin EC  81 mg Oral Daily  . atorvastatin  40 mg Oral q1800  . citalopram  20 mg Oral Daily  . feeding supplement (NEPRO CARB STEADY)  237 mL Oral BID BM  . heparin  5,000 Units Subcutaneous 3 times per day  . hydrALAZINE  100 mg Oral TID  . insulin aspart  0-9 Units Subcutaneous TID WC  . insulin detemir  30 Units Subcutaneous Daily  . isosorbide mononitrate  60 mg Oral Daily  . levothyroxine  125 mcg Oral QAC breakfast  . metolazone  5 mg Oral Daily  . pantoprazole  40 mg Oral Daily  . silver sulfADIAZINE   Topical Daily  . sodium chloride  3 mL Intravenous Q12H  . torsemide  80 mg Oral BID     Objective:    Vital Signs:   Temp:  [98.4 F (36.9 C)-99.3 F (37.4 C)] 98.6 F (37 C) (08/03 1700) Pulse Rate:  [57-64] 63 (08/03 1700) Resp:  [18-20] 18 (08/03 1700) BP: (112-141)/(45-55) 116/50 mmHg (08/03 1700) SpO2:  [93 %-98 %] 98 % (08/03 1700) Weight:  [107.9 kg (237 lb 14 oz)] 107.9  kg (237 lb 14 oz) (08/03 0954) Last BM Date: 09/22/14  Weight change: Filed Weights   09/18/14 1240 09/19/14 2135 09/22/14 0954  Weight: 106.5 kg (234 lb 12.6 oz) 107.8 kg (237 lb 10.5 oz) 107.9 kg (237 lb 14 oz)    Intake/Output:   Intake/Output Summary (Last 24 hours) at 09/22/14 1728 Last data filed at 09/22/14 1700  Gross per 24 hour  Intake    782 ml  Output   2503 ml  Net  -1721 ml     Physical Exam: General:  Elderly weak appearing. No resp difficulty HEENT: normal Neck: supple. JVP to jaw . Carotids 2+ bilat; no bruits. No lymphadenopathy or thryomegaly appreciated. Cor: PMI nondisplaced. Regular rate & rhythm. 2/6 TR Lungs: clear Abdomen: soft, nontender, + distended. No hepatosplenomegaly. No bruits or masses. Good bowel sounds. Extremities: no cyanosis, clubbing, rash, s/p R AKA  1+ edema on L wrapped Neuro: alert & oriented, garbled speech    ECG: NSR 78 with long 1AVB (353ms)  Labs: Basic Metabolic Panel:  Recent Labs Lab 09/17/14 0523 09/18/14 0453 09/19/14 0504 09/20/14 0450 09/22/14 0604  NA 137 141 141 142 145  K 4.0 4.0 4.0 3.8 3.4*  CL 105 109 106 110 112*  CO2 21* 20* 19* 21* 23  GLUCOSE 117* 83  77 106* 115*  BUN 73* 79* 84* 84* 81*  CREATININE 4.97* 4.90* 4.68* 4.35* 3.77*  CALCIUM 7.9* 8.0* 8.4* 8.2* 8.4*    Liver Function Tests: No results for input(s): AST, ALT, ALKPHOS, BILITOT, PROT, ALBUMIN in the last 168 hours. No results for input(s): LIPASE, AMYLASE in the last 168 hours. No results for input(s): AMMONIA in the last 168 hours.  CBC:  Recent Labs Lab 09/17/14 0523 09/18/14 0453 09/19/14 0504 09/20/14 0450 09/22/14 0604  WBC 19.4* 16.4* 18.1* 17.4* 16.5*  HGB 8.1* 7.4* 8.9* 8.6* 9.5*  HCT 26.1* 24.7* 29.0* 28.4* 31.2*  MCV 81.3 81.8 83.8 83.5 85.0  PLT 492* 538* 529* 566* 568*    Cardiac Enzymes: No results for input(s): CKTOTAL, CKMB, CKMBINDEX, TROPONINI in the last 168 hours.  BNP: BNP (last 3  results)  Recent Labs  03/10/14 1039 04/28/14 1530  BNP 456.3* 153.7*    ProBNP (last 3 results)  Recent Labs  10/21/13 2012 11/28/13 1215 01/27/14 1709  PROBNP 1541.0* 1597.0* 2020.0*     CBG:  Recent Labs Lab 09/21/14 1737 09/21/14 2117 09/22/14 0756 09/22/14 1208 09/22/14 1634  GLUCAP 98 120* 107* 146* 107*    Coagulation Studies: No results for input(s): LABPROT, INR in the last 72 hours.  Other results: Imaging: No results found.   Medications:     Current Medications: . allopurinol  100 mg Oral QHS  . amiodarone  100 mg Oral Daily  . amLODipine  10 mg Oral Daily  . aspirin EC  81 mg Oral Daily  . atorvastatin  40 mg Oral q1800  . citalopram  20 mg Oral Daily  . feeding supplement (NEPRO CARB STEADY)  237 mL Oral BID BM  . heparin  5,000 Units Subcutaneous 3 times per day  . hydrALAZINE  100 mg Oral TID  . insulin aspart  0-9 Units Subcutaneous TID WC  . insulin detemir  30 Units Subcutaneous Daily  . isosorbide mononitrate  60 mg Oral Daily  . levothyroxine  125 mcg Oral QAC breakfast  . metolazone  5 mg Oral Daily  . pantoprazole  40 mg Oral Daily  . silver sulfADIAZINE   Topical Daily  . sodium chloride  3 mL Intravenous Q12H  . torsemide  80 mg Oral BID    Infusions: . sodium chloride 10 mL/hr at 09/13/14 0111  . sodium chloride 10 mL/hr at 09/13/14 2007     Assessment:  1) Acute on chronic renal failure 2) Acute on chronic diastolic HF 3) PAD 4) HTN 5) PAF 6) Previous CVA 7) DNR/DNI  Plan/Discussion:    I know [name] well from the [facility]. He has had a prolonged admission with R AKA and progressive renal failure with creatinine up to 5.1. Renal function seems to be slowly getting better but remains markedly volume overloaded. There has been much discussion between the medical team and various members of his family as to whether he should pursue dialysis or switch to comfort care. I had a long talk with his daughter and  [name], [name] who states firmly that her and her father have talked about this in the past and he would want HD if needed. She realizes that other family members don't agree with this completely but she is his 63 and has discussed this with him in past. She did confirm DNR/DNI.   Creatinine continues to improve. Diuresis picking up but weight stable. It is a bit hard to assess his volume status but it  does seem still elevated. Will continue torsemide 80 bid and daily metolazone for now. Can try IV lasix if needed. Discussed with patient and family at bedside.   Travis Cerone,MD 5:28 PM  Length of Stay: 16  Advanced Heart Failure Team Pager [phone] (M-F; 7a - 4p)  Please contact [facility] for night-coverage after hours (4p -7a ) and weekends on [email or web address]

## 2014-09-23 LAB — BASIC METABOLIC PANEL
Anion gap: 11 (ref 5–15)
BUN: 80 mg/dL — AB (ref 6–20)
CO2: 24 mmol/L (ref 22–32)
CREATININE: 3.85 mg/dL — AB (ref 0.61–1.24)
Calcium: 8.1 mg/dL — ABNORMAL LOW (ref 8.9–10.3)
Chloride: 109 mmol/L (ref 101–111)
GFR calc non Af Amer: 14 mL/min — ABNORMAL LOW (ref >60)
GFR, EST AFRICAN AMERICAN: 16 mL/min — AB (ref >60)
GLUCOSE: 95 mg/dL (ref 65–99)
Potassium: 3.1 mmol/L — ABNORMAL LOW (ref 3.5–5.1)
Sodium: 144 mmol/L (ref 135–145)

## 2014-09-23 LAB — GLUCOSE, CAPILLARY
Glucose-Capillary: 101 mg/dL — ABNORMAL HIGH (ref 65–99)
Glucose-Capillary: 130 mg/dL — ABNORMAL HIGH (ref 65–99)
Glucose-Capillary: 148 mg/dL — ABNORMAL HIGH (ref 65–99)

## 2014-09-23 MED ORDER — POTASSIUM CHLORIDE CRYS ER 20 MEQ PO TBCR
20.0000 meq | EXTENDED_RELEASE_TABLET | Freq: Once | ORAL | Status: AC
  Administered 2014-09-23: 20 meq via ORAL
  Filled 2014-09-23: qty 1

## 2014-09-23 MED ORDER — INSULIN DETEMIR 100 UNIT/ML ~~LOC~~ SOLN: 30.0000 [IU] | Freq: Every day | SUBCUTANEOUS | Status: DC

## 2014-09-23 NOTE — Discharge Summary (Signed)
Physician Discharge Summary  [name] Sr. ZOX:[id number] DOB: [birth year] DOA: 09/06/2014  PCP: Travis Cower, MD  Admit date: 09/06/2014 Discharge date: 09/23/2014  Time spent: > 35 minutes  Recommendations for Outpatient Follow-up:  1. Daily weights 2. Monitor cbc 3. Monitor blood sugars 4. Monitor serum creatinine 5. Reassess K levels  Discharge Diagnoses:  Active Problems:   Type 2 diabetes mellitus with renal manifestations   Paroxysmal atrial fibrillation- in AF 03/10/14   PROSTATE CANCER, HX OF   Normochromic normocytic anemia   Chronic renal failure   Wound, open, foot   Diabetic foot ulcer   Palliative care encounter   Acute on chronic renal failure   Discharge Condition: stable  Diet recommendation: diabetic and renal diet  Filed Weights   09/19/14 2135 09/22/14 0954 09/22/14 2205  Weight: 107.8 kg (237 lb 10.5 oz) 107.9 kg (237 lb 14 oz) 107.591 kg (237 lb 3.1 oz)    History of present illness:  81 year old male with history of CKD, CHF presented with worsening edema and difficulty ambulating. Patient is s/p RAKA. Developed worsening kidney function as well as CHF exacerbation. Currently on diuresis. Patient found to not be a good candidate for dialysis. Patient's family had decided on hospice however daughter has now changed her mind yet again. Pending repeat PT evaluation and possible SNF placement  Hospital Course:  Acute on CKD stage 4 -Nephrology consulted and appreciated  -Continue torsemide -Cr 4.3 , no felt to be a good HD candidate due to comorbidities -Renal US: negative hydronephrosis  Acute Diastolic CHF exacerbation -Echocardiogram 03/11/2014 showed an EF of 55-60% -patient received IVF due to AKI -Patient is currently on torsemide 80 mg by mouth twice a day  Necrotic right foot/leg -Patient has chronic bilateral lower extremity sores, likely due to diabetes mellitus -Orthopedics consultation appreciated -Status post right AKA -Was on  vanc and zosyn currently off IV antibiotics -Continue wound care at SNF   Post op fever/ Sepsis secondary to possible HCAP -Currently afebrile however had a fever of 101.3 on 09/14/2014 -Leukocytosis imroved (however antibiotics discontinued 09/17/2014) -Blood cultures 09/15/2014 show no growth to date -Strep pneumo and legionella urine antigens neg -Repeat CXR 7/30: suspected multifocal pna in B/L upper lobes and RLL -Patient received vanc and zosyn for 10 days (strange that HCAP would no resolve) -My associate spoke with ID, Dr. 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  Myelodysplastic syndrome-hx of transfusions/ Anemia of chronic disease -Hb stable  Sclerotic lesion on ribs on CXR -patient has sclerotic lesions on the ribs on CXR, -Dr [name] is aware of this finding as per his last note in epic. He will follow the patient as outpatient.  Hypothyroid -Continue synthroid 125 mcg  History of prostate cancer-15 yrs ago -stable   Essential hypertension -Stable, continue hydralazine 100 mg tablet and imdur  Dyslipidemia -continue statin   CAD, moderate -pt denies any chest pain.   Obesity  -Body mass index is 32.1 kg/(m^2).  Procedures:  None  Consultations:  Cardiology  Discharge Exam: Filed Vitals:   09/23/14 1000  BP: 130/50  Pulse: 59  Temp: 98.2 F (36.8 C)  Resp: 18    General: Pt in nad, alert  Cardiovascular: rrr, no mrg Respiratory: no increased wob, no wheezes  Discharge Instructions   Discharge Instructions    Call MD for:  difficulty breathing,  headache or visual disturbances    Complete by:  As directed      Call MD for:  redness, tenderness, or signs of infection (pain, swelling, redness, odor or green/yellow discharge around incision site)    Complete by:  As directed       Call MD for:  temperature >100.4    Complete by:  As directed      Diet - low sodium heart healthy    Complete by:  As directed      Discharge instructions    Complete by:  As directed   Patient to follow-up with primary care physician within the next one or 2 weeks     Increase activity slowly    Complete by:  As directed           Current Discharge Medication List    START taking these medications   Details  albuterol (PROVENTIL) (2.5 MG/3ML) 0.083% nebulizer solution Take 3 mLs (2.5 mg total) by nebulization every 4 (four) hours as needed for wheezing or shortness of breath. Qty: 75 mL, Refills: 12    ALPRAZolam (XANAX) 0.25 MG tablet Take 1 tablet (0.25 mg total) by mouth 3 (three) times daily as needed for anxiety. Qty: 30 tablet, Refills: 0    feeding supplement, GLUCERNA SHAKE, (GLUCERNA SHAKE) LIQD Take 237 mLs by mouth 2 (two) times daily between meals. Refills: 0    oxyCODONE (OXY IR/ROXICODONE) 5 MG immediate release tablet Take 1 tablet (5 mg total) by mouth every 3 (three) hours as needed for moderate pain. Qty: 30 tablet, Refills: 0      CONTINUE these medications which have CHANGED   Details  insulin detemir (LEVEMIR) 100 UNIT/ML injection Inject 0.3 mLs (30 Units total) into the skin daily. Qty: 10 mL, Refills: 11    silver sulfADIAZINE (SILVADENE) 1 % cream Apply topically daily. Qty: 50 g, Refills: 0      CONTINUE these medications which have NOT CHANGED   Details  !! ACCU-CHEK AVIVA PLUS test strip USE AS DIRECTED TWICE DAILY TO CHECK BLOOD SUGAR Qty: 100 each, Refills: 7    allopurinol (ZYLOPRIM) 100 MG tablet Take 1 tablet (100 mg total) by mouth at bedtime. Qty: 30 tablet, Refills: 11    amiodarone (PACERONE) 200 MG tablet Take 0.5 tablets (100 mg total) by mouth every morning. Qty: 45 tablet, Refills: 3    amLODipine (NORVASC) 10 MG tablet TAKE 1 TABLET BY MOUTH EVERY DAY Qty: 30 tablet, Refills: 3    aspirin EC 81 MG EC tablet Take 1  tablet (81 mg total) by mouth daily.    atorvastatin (LIPITOR) 40 MG tablet TAKE 1 TABLET BY MOUTH EVERY DAY Qty: 30 tablet, Refills: 6    !! B-D ULTRAFINE III SHORT PEN 31G X 8 MM MISC Refills: 3    !! B-D ULTRAFINE III SHORT PEN 31G X 8 MM MISC USE AS DIRECTED EVERY MORNING Qty: 100 each, Refills: 3    citalopram (CELEXA) 10 MG tablet Take 20 mg by mouth daily.     Fluticasone-Salmeterol (ADVAIR) 100-50 MCG/DOSE AEPB Inhale 1 puff into the lungs as needed (for shortness of breath).    Associated Diagnoses: Chronic kidney disease, stage III (moderate)    !! glucose blood (ONE TOUCH ULTRA TEST) test strip 1 each by Other route 3 (three) times daily. Use as directed three times daily to check blood sugar.  Diagnosis code E11.29 Qty: 100 each, Refills: 5    hydrALAZINE (APRESOLINE) 100 MG tablet  Take 100 mg by mouth 3 (three) times daily. Refills: 6    isosorbide mononitrate (IMDUR) 60 MG 24 hr tablet TAKE 1 TABLET (60 MG TOTAL) BY MOUTH DAILY. Qty: 90 tablet, Refills: 2    levothyroxine (SYNTHROID, LEVOTHROID) 125 MCG tablet Take 125 mcg by mouth every morning. Refills: 11    neomycin-bacitracin-polymyxin (NEOSPORIN) OINT Apply 1 application topically daily.    omeprazole (PRILOSEC) 20 MG capsule Take 1 capsule (20 mg total) by mouth daily. Qty: 30 capsule, Refills: 6    potassium chloride SA (K-DUR,KLOR-CON) 20 MEQ tablet Take 1 tablet (20 mEq total) by mouth 2 (two) times daily. Qty: 180 tablet, Refills: 3    TRIGELS-F FORTE 460-60-0.01-1 MG CAPS capsule TAKE 1 CAPSULE BY MOUTH DAILY. Qty: 30 capsule, Refills: 5     !! - Potential duplicate medications found. Please discuss with provider.    STOP taking these medications     cloNIDine (CATAPRES - DOSED IN MG/24 HR) 0.1 mg/24hr patch      diclofenac sodium (VOLTAREN) 1 % GEL      torsemide (DEMADEX) 20 MG tablet      traMADol (ULTRAM) 50 MG tablet        Allergies  Allergen Reactions  . Ace Inhibitors Other (See  Comments)    HYPOTENSION  . Beta Adrenergic Blockers Other (See Comments)     use cautiously secondary to 2nd degree heart block, hypotension   Follow-up Information    Follow up with Malka So, MD.   Specialty:  Urology   Why:  you need to have repeat evaluation for your prostate   Contact information:   [place] [address] [phone]       Follow up with [facility], MD.   Specialty:  Oncology   Contact information:   [place] [address] [phone]       Follow up with Travis Cower, MD In 1 week.   Specialties:  Internal Medicine, Radiology   Why:  you need recheck of labs and kidney function   Contact information:   [place] [address] [phone]       Follow up with CLEGG,AMY, NP.   Specialty:  Nurse Practitioner   Contact information:   [address] [place] [address] [phone]       Call [place].   Specialty:  Home Health Services   Why:  As needed   Contact information:   [address] [place] [address] [phone]       Follow up with DUDA,MARCUS V, MD In 3 weeks.   Specialty:  Orthopedic Surgery   Contact information:   [place] [address] [phone]        The results of significant diagnostics from this hospitalization (including imaging, microbiology, ancillary and laboratory) are listed below for reference.    Significant Diagnostic Studies: Dg Chest 1 View  09/07/2014   CLINICAL DATA:  Fluid overload, history CHF, COPD, hypertension, diabetes mellitus, prostate cancer  EXAM: CHEST  1 VIEW  COMPARISON:  Portable exam 1902 hours compared to 09/06/2014  FINDINGS: Enlargement of cardiac silhouette.  Atherosclerotic calcification aorta.  Mediastinal contour stable.  Diffuse pulmonary infiltrates question edema versus less likely infection, increased versus previous study.  No pleural effusion or pneumothorax.   Sclerotic foci at the proximal LEFT humerus question related to metastatic disease  IMPRESSION: Enlargement of cardiac silhouette with diffuse BILATERAL  infiltrates favoring pulmonary edema over infection.  Sclerotic foci at the proximal LEFT humerus cannot exclude sclerotic osseous metastases in this patient with a history of prostate cancer.   Electronically Signed   By: Lavonia Dana M.D.   On: 09/07/2014 20:02   Dg Chest 2 View  09/09/2014   CLINICAL DATA:  81 year old male with fever, cough and congestion  EXAM: CHEST  2 VIEW  COMPARISON:  Prior chest x-ray 09/07/2014  FINDINGS: Overall, there has been some improvement in diffuse bilateral interstitial and airspace opacities. However, there remains some focal airspace opacification within the right mid lung. No pleural effusion. No pneumothorax. Stable cardiomegaly and mediastinal contours. Atherosclerotic calcification remains present within the transverse aorta.  IMPRESSION: 1. Improving pulmonary edema. 2. Residual patchy airspace opacity in the right mid lung in the region of the minor fissure may represent superimposed infiltrate/pneumonia, or perhaps some fluid trapped within the minor fissure. 3. Stable cardiomegaly.   Electronically Signed   By: Jacqulynn Cadet M.D.   On: 09/09/2014 10:21   US Renal  09/17/2014   CLINICAL DATA:  Acute renal insufficiency  EXAM: RENAL / URINARY TRACT ULTRASOUND COMPLETE  COMPARISON:  None.  FINDINGS: Right Kidney:  Length: 10.2 cm. Echogenicity within normal limits. No mass or hydronephrosis visualized.  Left Kidney:  Length: 10.5 cm.  Limited views, negative for hydronephrosis.  Bladder:  Not seen.  IMPRESSION: Negative for hydronephrosis.  Limited views of the left kidney.   Electronically Signed   By: Andreas Newport M.D.   On: 09/17/2014 02:44   Nm Pulmonary Perf And Vent  09/08/2014   CLINICAL DATA:  Shortness of breath.  EXAM: NUCLEAR MEDICINE VENTILATION - PERFUSION LUNG SCAN  TECHNIQUE: Ventilation  images were obtained in multiple projections using inhaled aerosol Tc-23m DTPA. Perfusion images were obtained in multiple projections after intravenous injection of Tc-16m MAA.  RADIOPHARMACEUTICALS:  39.2 Technetium-67m DTPA aerosol inhalation and 6.45 Technetium-71m MAA IV  COMPARISON:  Chest x-ray 09/07/2014.  FINDINGS: Ventilation: Severe bilateral ventilatory defects. No segmental sized perfusion defects noted.  Perfusion: No wedge shaped peripheral perfusion defects to suggest acute pulmonary embolism.  IMPRESSION: Severe bilateral ventilatory defects consistent with bilateral airspace disease noted on recent chest x-ray. No perfusion defects noted to suggest pulmonary embolus. Low probability scan for pulmonary embolus.   Electronically Signed   By: Marcello Moores  Register   On: 09/08/2014 12:12   Dg Chest Port 1 View  09/18/2014   CLINICAL DATA:  Leukocytosis  EXAM: PORTABLE CHEST - 1 VIEW  COMPARISON:  09/15/2014  FINDINGS: Multifocal patchy opacities in the right upper and lower lobes and left prior hilar region, suspicious for multifocal pneumonia, less likely interstitial edema. No definite pleural effusions. No pneumothorax.  The heart is top-normal in size.  IMPRESSION: Suspected multifocal pneumonia in the bilateral upper lobes and right lower lobe, grossly unchanged.   Electronically Signed   By: Julian Hy M.D.   On: 09/18/2014 08:46   Dg Chest Port 1 View  09/15/2014   CLINICAL DATA:  Fever and difficulty breathing  EXAM: PORTABLE CHEST - 1 VIEW  COMPARISON:  September 09, 2014  FINDINGS: There is generalized interstitial edema. There remains slight alveolar consolidation in the mid right lung, less than on recent prior study. Heart is enlarged. The pulmonary vascular is within normal limits. No adenopathy.  IMPRESSION: Findings consistent with congestive heart failure. A small focus of pneumonia in the right mid lung cannot be excluded.   Electronically Signed   By:  Lowella Grip III M.D.    On: 09/15/2014 16:53   Dg Chest Portable 1 View  09/06/2014   CLINICAL DATA:  Lower extremity weakness  EXAM: PORTABLE CHEST - 1 VIEW  COMPARISON:  March 10, 2014  FINDINGS: There is scarring in both mid lung regions. There is no frank edema or consolidation. Heart is mildly enlarged with pulmonary vascular within normal limits. There is atherosclerotic change in aorta. The bony structures appear stable compared to prior study without focal lesion. Several bony structures appear rather sclerotic a particularly in the medial proximal right humerus, incompletely visualized on this study.  IMPRESSION: Areas of lung scarring. No frank edema or consolidation. Several bones appear somewhat sclerotic. This finding may warrant prostate evaluation given the propensity of prostate carcinoma to present with sclerotic bony metastases.   Electronically Signed   By: Lowella Grip III M.D.   On: 09/06/2014 09:20   Dg Foot Complete Right  09/06/2014   CLINICAL DATA:  Right foot pain, wound.  EXAM: RIGHT FOOT COMPLETE - 3+ VIEW  COMPARISON:  None.  FINDINGS: Soft tissue swelling within the right foot. No acute bony abnormality. No acute fracture, subluxation or dislocation. Possible old healed fractures in the proximal second and third metatarsals. No soft tissue gas.  IMPRESSION: No acute bony abnormality.   Electronically Signed   By: Rolm Baptise M.D.   On: 09/06/2014 14:55    Microbiology: Recent Results (from the past 240 hour(s))  Culture, blood (routine x 2)     Status: None   Collection Time: 09/15/14  3:20 PM  Result Value Ref Range Status   Specimen Description BLOOD LEFT ARM  Final   Special Requests BOTTLES DRAWN AEROBIC AND ANAEROBIC 10CC  Final   Culture NO GROWTH 5 DAYS  Final   Report Status 09/20/2014 FINAL  Final  Culture, blood (routine x 2)     Status: None   Collection Time: 09/15/14  3:30 PM  Result Value Ref Range Status   Specimen Description BLOOD LEFT HAND  Final   Special  Requests BOTTLES DRAWN AEROBIC ONLY 3CC  Final   Culture NO GROWTH 5 DAYS  Final   Report Status 09/20/2014 FINAL  Final  Culture, Urine     Status: None   Collection Time: 09/16/14  1:44 AM  Result Value Ref Range Status   Specimen Description URINE, RANDOM  Final   Special Requests NONE  Final   Culture 30,000 COLONIES/mL YEAST  Final   Report Status 09/17/2014 FINAL  Final     Labs: Basic Metabolic Panel:  Recent Labs Lab 09/18/14 0453 09/19/14 0504 09/20/14 0450 09/22/14 0604 09/23/14 0427  NA 141 141 142 145 144  K 4.0 4.0 3.8 3.4* 3.1*  CL 109 106 110 112* 109  CO2 20* 19* 21* 23 24  GLUCOSE 83 77 106* 115* 95  BUN 79* 84* 84* 81* 80*  CREATININE 4.90* 4.68* 4.35* 3.77* 3.85*  CALCIUM 8.0* 8.4* 8.2* 8.4* 8.1*   Liver Function Tests: No results for input(s): AST, ALT, ALKPHOS, BILITOT, PROT, ALBUMIN in the last 168 hours. No results for input(s): LIPASE, AMYLASE in the last 168 hours. No results for input(s): AMMONIA in the last 168 hours. CBC:  Recent Labs Lab 09/17/14 0523 09/18/14 0453 09/19/14 0504 09/20/14 0450 09/22/14 0604  WBC 19.4* 16.4* 18.1* 17.4* 16.5*  HGB 8.1* 7.4* 8.9* 8.6* 9.5*  HCT 26.1* 24.7* 29.0* 28.4* 31.2*  MCV 81.3 81.8 83.8 83.5 85.0  PLT 492* 538*  529* 566* 568*   Cardiac Enzymes: No results for input(s): CKTOTAL, CKMB, CKMBINDEX, TROPONINI in the last 168 hours. BNP: BNP (last 3 results)  Recent Labs  03/10/14 1039 04/28/14 1530  BNP 456.3* 153.7*    ProBNP (last 3 results)  Recent Labs  10/21/13 2012 11/28/13 1215 01/27/14 1709  PROBNP 1541.0* 1597.0* 2020.0*    CBG:  Recent Labs Lab 09/22/14 1208 09/22/14 1634 09/22/14 2211 09/23/14 0743 09/23/14 1136  GLUCAP 146* 107* 104* 101* 130*       Signed:  Velvet Bathe  [facility] 09/23/2014, 3:51 PM

## 2014-09-23 NOTE — Clinical Social Work Placement (Addendum)
   CLINICAL SOCIAL WORK PLACEMENT  NOTE  Date:  09/23/2014  Patient Details  Name: Travis KALL Sr. MRN: [id number] Date of Birth: May 01, 1932  Clinical Social Work is seeking post-discharge placement for this patient at the [place] level of care (*CSW will initial, date and re-position this form in  chart as items are completed):  Yes   Patient/family provided with [facility] Work Department's list of facilities offering this level of care within the geographic area requested by the patient (or if unable, by the patient's family).  Yes   Patient/family informed of their freedom to choose among providers that offer the needed level of care, that participate in Medicare, Medicaid or managed care program needed by the patient, have an available bed and are willing to accept the patient.  Yes   Patient/family informed of [facility]'s ownership interest in [facility] and [facility], as well as of the fact that they are under no obligation to receive care at these facilities.  PASRR submitted to EDS on 09/06/14     PASRR number received on       Existing PASRR number confirmed on 09/06/14     FL2 transmitted to all facilities in geographic area requested by pt/family on       FL2 transmitted to all facilities within larger geographic area on 09/06/14     Patient informed that his/her managed care company has contracts with or will negotiate with certain facilities, including the following:            Patient/family informed of bed offers received.  Patient chooses bed at  [facility]     Physician recommends and patient chooses bed at      Patient to be transferred to  [facility] on  09/23/14.  Patient to be transferred to facility by  ambulance     Patient family notified on  09/23/14 of transfer.  Name of family member notified:   Daughter [name]     PHYSICIAN       Additional Comment:  09/23/14 - TC from [place] with  [facility]. She received authorization from [facility] for patient. Informed her that d/c summary sent to facility via [facility]. Family aware of discharge and daughter [name] has completed admissions paperwork.   _______________________________________________ Sable Feil, LCSW 09/23/2014, 4:04 PM

## 2014-09-23 NOTE — Progress Notes (Signed)
Called report to [employer], Therapist, sports, at [facility].

## 2014-09-23 NOTE — Progress Notes (Signed)
Advanced Heart Failure Team Progress Note  Referring Physician: [facility] Primary Cardiologist:  [place]  Reason for Consultation: HF/Renal failure  HPI:    [name] is an 81 year old man with diastolic CHF, CKD stage 5, HTN, BPH, COPD, h/o prostate ca, a-fib, CVA, DM, and myelodysplastic syndrome. 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 He was living independently at home prior to this hospitalization. He is wheel chair bound at baseline and could pivot. Since his hospitalization his kidney function has been worsening with peak Cr at 5.1. He has continued to have fluid overload He was seen by nephrology who felt he was not a good HD candidate due to comorbidities. Foruntately creatinine improving  Feels better. Denies dyspnea. Feels that urine output is picking up, now eight on chart yet.   Creatinine 4.35 -> 3.7 -> 3.8  . allopurinol  100 mg Oral QHS  . amiodarone  100 mg Oral Daily  . amLODipine  10 mg Oral Daily  . aspirin EC  81 mg Oral Daily  . atorvastatin  40 mg Oral q1800  . citalopram  20 mg Oral Daily  . feeding supplement (NEPRO CARB STEADY)  237 mL Oral BID BM  . heparin  5,000 Units Subcutaneous 3 times per day  . hydrALAZINE  100 mg Oral TID  . insulin aspart  0-9 Units Subcutaneous TID WC  . insulin detemir  30 Units Subcutaneous Daily  . isosorbide mononitrate  60 mg Oral Daily  . levothyroxine  125 mcg Oral QAC breakfast  . metolazone  5 mg Oral Daily  . pantoprazole  40 mg Oral Daily  . potassium chloride  10 mEq Oral Daily  . silver sulfADIAZINE   Topical Daily  . sodium chloride  3 mL Intravenous Q12H  . torsemide  80 mg Oral BID     Objective:    Vital Signs:   Temp:  [98.1 F (36.7 C)-98.6 F (37 C)] 98.1 F (36.7 C) (08/04 0549) Pulse Rate:  [58-63] 58 (08/04 0549) Resp:  [18-20] 20 (08/04 0549) BP: (112-140)/(49-55) 140/55 mmHg (08/04 0549) SpO2:  [91 %-98 %] 93 %  (08/04 0549) Weight:  [107.591 kg (237 lb 3.1 oz)-107.9 kg (237 lb 14 oz)] 107.591 kg (237 lb 3.1 oz) (08/03 2205) Last BM Date: 09/22/14  Weight change: Filed Weights   09/19/14 2135 09/22/14 0954 09/22/14 2205  Weight: 107.8 kg (237 lb 10.5 oz) 107.9 kg (237 lb 14 oz) 107.591 kg (237 lb 3.1 oz)    Intake/Output:   Intake/Output Summary (Last 24 hours) at 09/23/14 0750 Last data filed at 09/23/14 0700  Gross per 24 hour  Intake    700 ml  Output   2550 ml  Net  -1850 ml     Physical Exam: General:  Elderly weak appearing. No resp difficulty HEENT: normal Neck: supple. JVP to jaw . Carotids 2+ bilat; no bruits. No lymphadenopathy or thryomegaly appreciated. Cor: PMI nondisplaced. Regular rate & rhythm. 2/6 TR Lungs: clear Abdomen: soft, nontender, + distended. No hepatosplenomegaly. No bruits or masses. Good bowel sounds. Extremities: no cyanosis, clubbing, rash, s/p R AKA  1+ edema on L wrapped Neuro: alert & oriented, garbled speech    ECG: NSR 78 with long 1AVB (388ms)  Labs: Basic Metabolic Panel:  Recent Labs Lab 09/18/14 0453 09/19/14 0504 09/20/14 0450 09/22/14 0604 09/23/14 0427  NA 141 141 142 145 144  K 4.0 4.0 3.8 3.4* 3.1*  CL 109 106 110 112* 109  CO2 20* 19* 21* 23 24  GLUCOSE 83 77 106* 115* 95  BUN 79* 84* 84* 81* 80*  CREATININE 4.90* 4.68* 4.35* 3.77* 3.85*  CALCIUM 8.0* 8.4* 8.2* 8.4* 8.1*    Liver Function Tests: No results for input(s): AST, ALT, ALKPHOS, BILITOT, PROT, ALBUMIN in the last 168 hours. No results for input(s): LIPASE, AMYLASE in the last 168 hours. No results for input(s): AMMONIA in the last 168 hours.  CBC:  Recent Labs Lab 09/17/14 0523 09/18/14 0453 09/19/14 0504 09/20/14 0450 09/22/14 0604  WBC 19.4* 16.4* 18.1* 17.4* 16.5*  HGB 8.1* 7.4* 8.9* 8.6* 9.5*  HCT 26.1* 24.7* 29.0* 28.4* 31.2*  MCV 81.3 81.8 83.8 83.5 85.0  PLT 492* 538* 529* 566* 568*    Cardiac Enzymes: No results for input(s): CKTOTAL,  CKMB, CKMBINDEX, TROPONINI in the last 168 hours.  BNP: BNP (last 3 results)  Recent Labs  03/10/14 1039 04/28/14 1530  BNP 456.3* 153.7*    ProBNP (last 3 results)  Recent Labs  10/21/13 2012 11/28/13 1215 01/27/14 1709  PROBNP 1541.0* 1597.0* 2020.0*     CBG:  Recent Labs Lab 09/21/14 2117 09/22/14 0756 09/22/14 1208 09/22/14 1634 09/22/14 2211  GLUCAP 120* 107* 146* 107* 104*    Coagulation Studies: No results for input(s): LABPROT, INR in the last 72 hours.  Other results: Imaging: No results found.   Medications:     Current Medications: . allopurinol  100 mg Oral QHS  . amiodarone  100 mg Oral Daily  . amLODipine  10 mg Oral Daily  . aspirin EC  81 mg Oral Daily  . atorvastatin  40 mg Oral q1800  . citalopram  20 mg Oral Daily  . feeding supplement (NEPRO CARB STEADY)  237 mL Oral BID BM  . heparin  5,000 Units Subcutaneous 3 times per day  . hydrALAZINE  100 mg Oral TID  . insulin aspart  0-9 Units Subcutaneous TID WC  . insulin detemir  30 Units Subcutaneous Daily  . isosorbide mononitrate  60 mg Oral Daily  . levothyroxine  125 mcg Oral QAC breakfast  . metolazone  5 mg Oral Daily  . pantoprazole  40 mg Oral Daily  . potassium chloride  10 mEq Oral Daily  . silver sulfADIAZINE   Topical Daily  . sodium chloride  3 mL Intravenous Q12H  . torsemide  80 mg Oral BID    Infusions: . sodium chloride 10 mL/hr at 09/13/14 0111  . sodium chloride 10 mL/hr at 09/13/14 2007     Assessment:  1) Acute on chronic renal failure 2) Acute on chronic diastolic HF 3) PAD 4) HTN 5) PAF 6) Previous CVA 7) DNR/DNI  Plan/Discussion:    I know [name] well from the [facility]. He has had a prolonged admission with R AKA and progressive renal failure with creatinine up to 5.1. Renal function seems to be slowly getting better but remains markedly volume overloaded. There has been much discussion between the medical team and various members of his  family as to whether he should pursue dialysis or switch to comfort care. I had a long talk with his daughter and Chauncey Reading, [name] who states firmly that her and her father have talked about this in the past and he would want HD if needed. She realizes that other family members don't agree with this completely but she is his 28 and has discussed this with him in past. She did  confirm DNR/DNI.   Creatinine seems to have plateaued and hopefully will not get worse again. It is a bit hard to assess his volume status but it does seem still elevated. Will continue torsemide 80 bid and daily metolazone for now. Watch renal function closely.   Aizza Santiago,MD 7:50 AM  Length of Stay: 17  Advanced Heart Failure Team Pager [phone] (M-F; 7a - 4p)  Please contact [facility] for night-coverage after hours (4p -7a ) and weekends on [email or web address]

## 2014-09-23 NOTE — Care Management Important Message (Signed)
Important Message  Patient Details  Name: Travis VEZINA Sr. MRN: [id number] Date of Birth: 1932/12/12   Medicare Important Message Given:  Yes-fourth notification given    Delorse Lek 09/23/2014, 2:30 PM

## 2014-09-24 ENCOUNTER — Telehealth: Payer: Self-pay | Admitting: *Deleted

## 2014-09-24 NOTE — Telephone Encounter (Signed)
Called pt daughter [name]) concerning tcm appt no answer LMOM RTC...[name]

## 2014-09-27 NOTE — Telephone Encounter (Signed)
Daughter call back she stated that pt was sent to SNF...[name]

## 2014-09-28 ENCOUNTER — Encounter: Payer: Self-pay | Admitting: Internal Medicine

## 2014-09-28 NOTE — Progress Notes (Deleted)
Patient ID: Lonia [name], male   DOB: March 24, 1932, 81 y.o.   MRN: [id number]        PCP: [name], MD  Code Status: ***  Allergies  Allergen Reactions  . Ace Inhibitors Other (See Comments)    HYPOTENSION  . Beta Adrenergic Blockers Other (See Comments)     use cautiously secondary to 2nd degree heart block, hypotension    Chief Complaint  Patient presents with  . New Admit To SNF     HPI:  81 y.o. patient is here for short term rehabilitation post hospital admission from   Review of Systems:  Constitutional: Negative for fever, chills, malaise/fatigue and diaphoresis.  HENT: Negative for headache, congestion, nasal discharge, hearing loss, earache, sore throat, difficulty swallowing.   Eyes: Negative for eye pain, blurred vision, double vision and discharge.  Respiratory: Negative for cough, shortness of breath and wheezing.   Cardiovascular: Negative for chest pain, palpitations, leg swelling.  Gastrointestinal: Negative for heartburn, nausea, vomiting, abdominal pain, loss of appetite, melena, diarrhea and constipation.  Genitourinary: Negative for dysuria, urgency, frequency, hematuria, incontinence and flank pain.  Musculoskeletal: Negative for back pain, falls, joint pain and myalgias. assistive device used Skin: Negative for itching, sores and rash.  Neurological: Negative for weakness,dizziness, tingling, focal weakness Psychiatric/Behavioral: Negative for depression, anxiety, insomnia and memory loss.    Past Medical History  Diagnosis Date  . Chronic diastolic heart failure     secondary to diastolic dysfunction EF (previous EF 35-45%)ef 60%4/09  . Arrhythmia     atrial fibrillation /pt on amiodarone,thought to be poor coumadin  . Stroke   . Other and unspecified hyperlipidemia   . Hypertension   . Hypertrophy of prostate without urinary obstruction and other lower urinary tract symptoms (LUTS)   . Osteoarthrosis, unspecified whether generalized or  localized, unspecified site   . Type II or unspecified type diabetes mellitus without mention of complication, not stated as uncontrolled   . AV block, [place] 1     Intolerance to ACE's / discontiuation of beta blockers  . Obstructive sleep apnea   . Iron deficiency anemia, unspecified     s/p EGD and coloscopy 3/10.gastritis.hemorrhids  . Cerebrovascular accident   . Renal insufficiency   . Obesity   . Allergic rhinitis, cause unspecified 05/29/2010  . CAD (coronary artery disease)     cath 12/04: mLAD 50-60%, mCFX 20-30%, pRCA 50-60%, mRCA 40%  . Normochromic normocytic anemia 04/21/2013  . Abnormal CXR 04/21/2013  . Hypertensive kidney disease with CKD stage III 04/21/2013  . Prostate cancer   . CHF (congestive heart failure)   . COPD (chronic obstructive pulmonary disease)   . Shortness of breath dyspnea   . Pneumonia    Past Surgical History  Procedure Laterality Date  . Prostate surgery    . Prostate surgury      hx of  . Esophagogastroduodenoscopy N/A 04/21/2013    Procedure: ESOPHAGOGASTRODUODENOSCOPY (EGD);  Surgeon: Irene Shipper, MD;  Location: [facility];  Service: Endoscopy;  Laterality: N/A;  . Amputation Right 09/13/2014    Procedure: AMPUTATION ABOVE KNEE;  Surgeon: Newt Minion, MD;  Location: [facility];  Service: Orthopedics;  Laterality: Right;   Social History:   reports that he has quit smoking. He has never used smokeless tobacco. He reports that he does not drink alcohol or use illicit drugs.  Family History  Problem Relation Age of Onset  . Heart disease Father   . Dementia Mother   .  CAD Sister     Medications:   Medication List       This list is accurate as of: 09/28/14  4:53 PM.  Always use your most recent med list.               albuterol (2.5 MG/3ML) 0.083% nebulizer solution  Commonly known as:  PROVENTIL  Take 3 mLs (2.5 mg total) by nebulization every 4 (four) hours as needed for wheezing or shortness of breath.     allopurinol 100 MG tablet   Commonly known as:  ZYLOPRIM  Take 1 tablet (100 mg total) by mouth at bedtime.     ALPRAZolam 0.25 MG tablet  Commonly known as:  XANAX  Take 1 tablet (0.25 mg total) by mouth 3 (three) times daily as needed for anxiety.     amiodarone 200 MG tablet  Commonly known as:  PACERONE  Take 0.5 tablets (100 mg total) by mouth every morning.     amLODipine 10 MG tablet  Commonly known as:  NORVASC  TAKE 1 TABLET BY MOUTH EVERY DAY     aspirin 81 MG EC tablet  Take 1 tablet (81 mg total) by mouth daily.     atorvastatin 40 MG tablet  Commonly known as:  LIPITOR  TAKE 1 TABLET BY MOUTH EVERY DAY     B-D ULTRAFINE III SHORT PEN 31G X 8 MM Misc  Generic drug:  Insulin Pen Needle     B-D ULTRAFINE III SHORT PEN 31G X 8 MM Misc  Generic drug:  Insulin Pen Needle  USE AS DIRECTED EVERY MORNING     citalopram 10 MG tablet  Commonly known as:  CELEXA  Take 20 mg by mouth daily.     feeding supplement (GLUCERNA SHAKE) Liqd  Take 237 mLs by mouth 2 (two) times daily between meals.     Fluticasone-Salmeterol 100-50 MCG/DOSE Aepb  Commonly known as:  ADVAIR  Inhale 1 puff into the lungs as needed (for shortness of breath).     glucose blood test strip  Commonly known as:  ONE TOUCH ULTRA TEST  1 each by Other route 3 (three) times daily. Use as directed three times daily to check blood sugar.  Diagnosis code E11.29     ACCU-CHEK AVIVA PLUS test strip  Generic drug:  glucose blood  USE AS DIRECTED TWICE DAILY TO CHECK BLOOD SUGAR     hydrALAZINE 100 MG tablet  Commonly known as:  APRESOLINE  Take 100 mg by mouth 3 (three) times daily.     insulin detemir 100 UNIT/ML injection  Commonly known as:  LEVEMIR  Inject 0.3 mLs (30 Units total) into the skin daily.     isosorbide mononitrate 60 MG 24 hr tablet  Commonly known as:  IMDUR  TAKE 1 TABLET (60 MG TOTAL) BY MOUTH DAILY.     levothyroxine 125 MCG tablet  Commonly known as:  SYNTHROID, LEVOTHROID  Take 125 mcg by mouth  every morning.     neomycin-bacitracin-polymyxin Oint  Commonly known as:  NEOSPORIN  Apply 1 application topically daily.     omeprazole 20 MG capsule  Commonly known as:  PRILOSEC  Take 1 capsule (20 mg total) by mouth daily.     oxyCODONE 5 MG immediate release tablet  Commonly known as:  Oxy IR/ROXICODONE  Take 1 tablet (5 mg total) by mouth every 3 (three) hours as needed for moderate pain.     potassium chloride SA 20 MEQ tablet  Commonly  known as:  K-DUR,KLOR-CON  Take 1 tablet (20 mEq total) by mouth 2 (two) times daily.     silver sulfADIAZINE 1 % cream  Commonly known as:  SILVADENE  Apply topically daily.     TRIGELS-F FORTE 460-60-0.01-1 MG Caps capsule  Generic drug:  Fe Fum-Vit C-Vit B12-FA  TAKE 1 CAPSULE BY MOUTH DAILY.         Physical Exam: *** Filed Vitals:   09/28/14 1653  BP: 128/60  Pulse: 60  Temp: 99 F (37.2 C)  Resp: 18  SpO2: 92%    General- elderly male, well built, in no acute distress Head- normocephalic, atraumatic Ears- left ear normal tympanic membrane and normal external ear canal , right ear normal tympanic membrane and normal external ear canal Nose- normal nasal mucosa, no maxillary or frontal sinus tenderness, no nasal discharge Throat- moist mucus membrane, normal oropharynx, dentition is  Eyes- PERRLA, EOMI, no pallor, no icterus, no discharge, normal conjunctiva, normal sclera Neck- no cervical lymphadenopathy, no supraclavicular lymphadenopathy, no thyromegaly, no jugular vein distension, no carotid bruit Chest- no chest wall deformities, no chest wall tenderness Breast- normal appearance, no masses or lumps on palpation, normal nipple and areola exam, no axillary lymphadenopathy Cardiovascular- normal s1,s2, no murmurs/ rubs/ gallops, dorsalis pedis and radial pulses, leg edema Respiratory- bilateral clear to auscultation, no wheeze, no rhonchi, no crackles, no use of accessory muscles Abdomen- bowel sounds present,  soft, non tender, no organomegaly, no abdominal bruits, no guarding or rigidity, no CVA tenderness Pelvic exam- normal pelvic exam, no adenexal or cervical motion tenderness Musculoskeletal- able to move all 4 extremities, no spinal and paraspinal tenderness, normal back curvature, steady gait, no use of assistive device, normal range of motion Neurological- no focal deficit, alert and oriented to person, place and time, normal reflexes, normal muscle strength, normal sensation to fine touch and vibration Skin- warm and dry Nails- hypertrophy, ingrown Psychiatry- normal mood and affect    Labs reviewed: Basic Metabolic Panel:  Recent Labs  10/22/13 0112 10/23/13 0405  09/20/14 0450 09/22/14 0604 09/23/14 0427  NA 145 144  < > 142 145 144  K 4.0 4.0  < > 3.8 3.4* 3.1*  CL 108 108  < > 110 112* 109  CO2 21 24  < > 21* 23 24  GLUCOSE 192* 101*  < > 106* 115* 95  BUN 43* 34*  < > 84* 81* 80*  CREATININE 2.04* 1.91*  < > 4.35* 3.77* 3.85*  CALCIUM 9.3 8.8  < > 8.2* 8.4* 8.1*  MG 2.1 2.1  --   --   --   --   < > = values in this interval not displayed. Liver Function Tests:  Recent Labs  09/07/14 0437 09/08/14 0511 09/13/14 1720  AST 19 25 24   ALT 23 22 22   ALKPHOS 150* 160* 120  BILITOT 0.2* 0.5 0.6  PROT 7.0 7.1 6.5  ALBUMIN 2.4* 2.4* 1.8*   No results for input(s): LIPASE, AMYLASE in the last 8760 hours. No results for input(s): AMMONIA in the last 8760 hours. CBC:  Recent Labs  09/06/14 0904  09/12/14 1055 09/13/14 1720  09/19/14 0504 09/20/14 0450 09/22/14 0604  WBC 14.4*  < > 16.5* 16.4*  < > 18.1* 17.4* 16.5*  NEUTROABS 12.6*  --  14.2* 14.1*  --   --   --   --   HGB 9.4*  < > 8.9* 9.0*  < > 8.9* 8.6* 9.5*  HCT 30.6*  < >  29.7* 29.7*  < > 29.0* 28.4* 31.2*  MCV 82.0  < > 82.5 80.7  < > 83.8 83.5 85.0  PLT 387  < > 412* 415*  < > 529* 566* 568*  < > = values in this interval not displayed. Cardiac Enzymes:  Recent Labs  01/27/14 2222 01/28/14 0258  03/10/14 1039  TROPONINI <0.30 <0.30 0.05*   BNP: Invalid input(s): POCBNP CBG:  Recent Labs  09/23/14 0743 09/23/14 1136 09/23/14 1644  GLUCAP 101* 130* 148*    Radiological Exams: Dg Chest 1 View  09/07/2014   CLINICAL DATA:  Fluid overload, history CHF, COPD, hypertension, diabetes mellitus, prostate cancer  EXAM: CHEST  1 VIEW  COMPARISON:  Portable exam 1902 hours compared to 09/06/2014  FINDINGS: Enlargement of cardiac silhouette.  Atherosclerotic calcification aorta.  Mediastinal contour stable.  Diffuse pulmonary infiltrates question edema versus less likely infection, increased versus previous study.  No pleural effusion or pneumothorax.  Sclerotic foci at the proximal LEFT humerus question related to metastatic disease  IMPRESSION: Enlargement of cardiac silhouette with diffuse BILATERAL infiltrates favoring pulmonary edema over infection.  Sclerotic foci at the proximal LEFT humerus cannot exclude sclerotic osseous metastases in this patient with a history of prostate cancer.   Electronically Signed   By: Lavonia Dana M.D.   On: 09/07/2014 20:02   Dg Chest Portable 1 View  09/06/2014   CLINICAL DATA:  Lower extremity weakness  EXAM: PORTABLE CHEST - 1 VIEW  COMPARISON:  March 10, 2014  FINDINGS: There is scarring in both mid lung regions. There is no frank edema or consolidation. Heart is mildly enlarged with pulmonary vascular within normal limits. There is atherosclerotic change in aorta. The bony structures appear stable compared to prior study without focal lesion. Several bony structures appear rather sclerotic a particularly in the medial proximal right humerus, incompletely visualized on this study.  IMPRESSION: Areas of lung scarring. No frank edema or consolidation. Several bones appear somewhat sclerotic. This finding may warrant prostate evaluation given the propensity of prostate carcinoma to present with sclerotic bony metastases.   Electronically Signed   By: Lowella Grip III M.D.   On: 09/06/2014 09:20   Dg Foot Complete Right  09/06/2014   CLINICAL DATA:  Right foot pain, wound.  EXAM: RIGHT FOOT COMPLETE - 3+ VIEW  COMPARISON:  None.  FINDINGS: Soft tissue swelling within the right foot. No acute bony abnormality. No acute fracture, subluxation or dislocation. Possible old healed fractures in the proximal second and third metatarsals. No soft tissue gas.  IMPRESSION: No acute bony abnormality.   Electronically Signed   By: Rolm Baptise M.D.   On: 09/06/2014 14:55    EKG: Independently reviewed. ***  Assessment/Plan No problem-specific assessment & plan notes found for this encounter.      Goals of care: short term rehabilitation   Labs/tests ordered:  Family/ staff Communication: reviewed care plan with patient and nursing supervisor    Blanchie Serve, MD  [facility] [phone] (Monday-Friday 8 am - 5 pm) [phone] (afterhours)   This encounter was created in error - please disregard.

## 2014-09-30 ENCOUNTER — Non-Acute Institutional Stay (SKILLED_NURSING_FACILITY): Payer: Medicare PPO | Admitting: Internal Medicine

## 2014-09-30 DIAGNOSIS — E46 Unspecified protein-calorie malnutrition: Secondary | ICD-10-CM | POA: Diagnosis not present

## 2014-09-30 DIAGNOSIS — E11621 Type 2 diabetes mellitus with foot ulcer: Secondary | ICD-10-CM

## 2014-09-30 DIAGNOSIS — R404 Transient alteration of awareness: Secondary | ICD-10-CM | POA: Diagnosis not present

## 2014-09-30 DIAGNOSIS — N189 Chronic kidney disease, unspecified: Secondary | ICD-10-CM

## 2014-09-30 DIAGNOSIS — J189 Pneumonia, unspecified organism: Secondary | ICD-10-CM | POA: Diagnosis not present

## 2014-09-30 DIAGNOSIS — D72829 Elevated white blood cell count, unspecified: Secondary | ICD-10-CM

## 2014-09-30 DIAGNOSIS — N186 End stage renal disease: Secondary | ICD-10-CM

## 2014-09-30 DIAGNOSIS — I5032 Chronic diastolic (congestive) heart failure: Secondary | ICD-10-CM

## 2014-09-30 DIAGNOSIS — I10 Essential (primary) hypertension: Secondary | ICD-10-CM

## 2014-09-30 DIAGNOSIS — E038 Other specified hypothyroidism: Secondary | ICD-10-CM

## 2014-09-30 DIAGNOSIS — R5381 Other malaise: Secondary | ICD-10-CM | POA: Diagnosis not present

## 2014-09-30 DIAGNOSIS — E1122 Type 2 diabetes mellitus with diabetic chronic kidney disease: Secondary | ICD-10-CM | POA: Diagnosis not present

## 2014-09-30 DIAGNOSIS — L97529 Non-pressure chronic ulcer of other part of left foot with unspecified severity: Secondary | ICD-10-CM

## 2014-09-30 DIAGNOSIS — D638 Anemia in other chronic diseases classified elsewhere: Secondary | ICD-10-CM

## 2014-09-30 DIAGNOSIS — L97519 Non-pressure chronic ulcer of other part of right foot with unspecified severity: Secondary | ICD-10-CM

## 2014-09-30 NOTE — Progress Notes (Signed)
Patient ID: Travis [name], male   DOB: 1933/01/13, 81 y.o.   MRN: [id number]     Facility: [facility]    PCP: [name], MD  Code Status: DNR  Allergies  Allergen Reactions  . Ace Inhibitors Other (See Comments)    HYPOTENSION  . Beta Adrenergic Blockers Other (See Comments)     use cautiously secondary to 2nd degree heart block, hypotension    Chief Complaint  Patient presents with  . New Admit To SNF     HPI:  81 y.o. patient is here for short term rehabilitation post hospital admission from 09/06/14-09/23/14 with worsening edema and difficulty with ambulation. He was noted to have worsening lower extremity sores, chf exacerbation and worsening kidney function. Orthopedic was consulted and he underwent right AKA. He was started on iv antibiotics. Renal was consulted. Hydronephrosis was ruled out. He was not felt to be a good candidate for dialysis. He was given torsemide. He also had sepsis this admission in setting of HCAP and was treated with antibiotics. He has past medical history of CKD, CHF, HTN, HLD, DM, obesity, MDS among others. He is seen in his room today. He is in no distress, sitting on his wheelchair. As per staff he has been confused and pulling off on his oxygen lately. He refuses to talk to me or be examined by me today. He is coughing during his visit   Review of Systems: refuses to participate   Past Medical History  Diagnosis Date  . Chronic diastolic heart failure     secondary to diastolic dysfunction EF (previous EF 35-45%)ef 60%4/09  . Arrhythmia     atrial fibrillation /pt on amiodarone,thought to be poor coumadin  . Stroke   . Other and unspecified hyperlipidemia   . Hypertension   . Hypertrophy of prostate without urinary obstruction and other lower urinary tract symptoms (LUTS)   . Osteoarthrosis, unspecified whether generalized or localized, unspecified site   . Type II or unspecified type diabetes mellitus  without mention of complication, not stated as uncontrolled   . AV block, Mobitz 1     Intolerance to ACE's / discontiuation of beta blockers  . Obstructive sleep apnea   . Iron deficiency anemia, unspecified     s/p EGD and coloscopy 3/10.gastritis.hemorrhids  . Cerebrovascular accident   . Renal insufficiency   . Obesity   . Allergic rhinitis, cause unspecified 05/29/2010  . CAD (coronary artery disease)     cath 12/04: mLAD 50-60%, mCFX 20-30%, pRCA 50-60%, mRCA 40%  . Normochromic normocytic anemia 04/21/2013  . Abnormal CXR 04/21/2013  . Hypertensive kidney disease with CKD stage III 04/21/2013  . Prostate cancer   . CHF (congestive heart failure)   . COPD (chronic obstructive pulmonary disease)   . Shortness of breath dyspnea   . Pneumonia    Past Surgical History  Procedure Laterality Date  . Prostate surgery    . Prostate surgury      hx of  . Esophagogastroduodenoscopy N/A 04/21/2013    Procedure: ESOPHAGOGASTRODUODENOSCOPY (EGD);  Surgeon: Irene Shipper, MD;  Location: [facility];  Service: Endoscopy;  Laterality: N/A;  . Amputation Right 09/13/2014    Procedure: AMPUTATION ABOVE KNEE;  Surgeon: Newt Minion, MD;  Location: [place];  Service: Orthopedics;  Laterality: Right;   Social History:   reports that he has quit smoking. He has never used smokeless tobacco. He reports that he does not drink alcohol or use illicit  drugs.  Family History  Problem Relation Age of Onset  . Heart disease Father   . Dementia Mother   . CAD Sister     Medications:   Medication List       This list is accurate as of: 09/30/14  5:13 PM.  Always use your most recent med list.               albuterol (2.5 MG/3ML) 0.083% nebulizer solution  Commonly known as:  PROVENTIL  Take 3 mLs (2.5 mg total) by nebulization every 4 (four) hours as needed for wheezing or shortness of breath.     allopurinol 100 MG tablet  Commonly known as:  ZYLOPRIM  Take 1 tablet (100 mg total) by mouth at  bedtime.     ALPRAZolam 0.25 MG tablet  Commonly known as:  XANAX  Take 1 tablet (0.25 mg total) by mouth 3 (three) times daily as needed for anxiety.     amiodarone 200 MG tablet  Commonly known as:  PACERONE  Take 0.5 tablets (100 mg total) by mouth every morning.     amLODipine 10 MG tablet  Commonly known as:  NORVASC  TAKE 1 TABLET BY MOUTH EVERY DAY     aspirin 81 MG EC tablet  Take 1 tablet (81 mg total) by mouth daily.     atorvastatin 40 MG tablet  Commonly known as:  LIPITOR  TAKE 1 TABLET BY MOUTH EVERY DAY     B-D ULTRAFINE III SHORT PEN 31G X 8 MM Misc  Generic drug:  Insulin Pen Needle     B-D ULTRAFINE III SHORT PEN 31G X 8 MM Misc  Generic drug:  Insulin Pen Needle  USE AS DIRECTED EVERY MORNING     citalopram 10 MG tablet  Commonly known as:  CELEXA  Take 20 mg by mouth daily.     feeding supplement (GLUCERNA SHAKE) Liqd  Take 237 mLs by mouth 2 (two) times daily between meals.     Fluticasone-Salmeterol 100-50 MCG/DOSE Aepb  Commonly known as:  ADVAIR  Inhale 1 puff into the lungs as needed (for shortness of breath).     glucose blood test strip  Commonly known as:  ONE TOUCH ULTRA TEST  1 each by Other route 3 (three) times daily. Use as directed three times daily to check blood sugar.  Diagnosis code E11.29     ACCU-CHEK AVIVA PLUS test strip  Generic drug:  glucose blood  USE AS DIRECTED TWICE DAILY TO CHECK BLOOD SUGAR     hydrALAZINE 100 MG tablet  Commonly known as:  APRESOLINE  Take 100 mg by mouth 3 (three) times daily.     insulin detemir 100 UNIT/ML injection  Commonly known as:  LEVEMIR  Inject 0.3 mLs (30 Units total) into the skin daily.     isosorbide mononitrate 60 MG 24 hr tablet  Commonly known as:  IMDUR  TAKE 1 TABLET (60 MG TOTAL) BY MOUTH DAILY.     levothyroxine 125 MCG tablet  Commonly known as:  SYNTHROID, LEVOTHROID  Take 125 mcg by mouth every morning.     neomycin-bacitracin-polymyxin Oint  Commonly known as:   NEOSPORIN  Apply 1 application topically daily.     omeprazole 20 MG capsule  Commonly known as:  PRILOSEC  Take 1 capsule (20 mg total) by mouth daily.     oxyCODONE 5 MG immediate release tablet  Commonly known as:  Oxy IR/ROXICODONE  Take 1 tablet (5 mg total)  by mouth every 3 (three) hours as needed for moderate pain.     potassium chloride SA 20 MEQ tablet  Commonly known as:  K-DUR,KLOR-CON  Take 1 tablet (20 mEq total) by mouth 2 (two) times daily.     silver sulfADIAZINE 1 % cream  Commonly known as:  SILVADENE  Apply topically daily.     TRIGELS-F FORTE 460-60-0.01-1 MG Caps capsule  Generic drug:  Fe Fum-Vit C-Vit B12-FA  TAKE 1 CAPSULE BY MOUTH DAILY.         Physical Exam: Filed Vitals:   09/30/14 1710  BP: 138/72  Pulse: 65  Temp: 97.4 F (36.3 C)  Resp: 19  Weight: 223 lb 14.4 oz (101.56 kg)  SpO2: 93%   Wt Readings from Last 3 Encounters:  09/30/14 223 lb 14.4 oz (101.56 kg)  09/22/14 237 lb 3.1 oz (107.591 kg)  06/22/14 244 lb (110.678 kg)    General- elderly male, obese, in no acute distress Head- normocephalic, atraumatic Nose- no nasal discharge Throat- moist mucus membrane Respiratory- no use of accessory muscles Musculoskeletal- right AKA, sitting on wheelchair Psychiatry- irritated and agitated   Labs reviewed: Basic Metabolic Panel:  Recent Labs  10/22/13 0112 10/23/13 0405  09/20/14 0450 09/22/14 0604 09/23/14 0427  NA 145 144  < > 142 145 144  K 4.0 4.0  < > 3.8 3.4* 3.1*  CL 108 108  < > 110 112* 109  CO2 21 24  < > 21* 23 24  GLUCOSE 192* 101*  < > 106* 115* 95  BUN 43* 34*  < > 84* 81* 80*  CREATININE 2.04* 1.91*  < > 4.35* 3.77* 3.85*  CALCIUM 9.3 8.8  < > 8.2* 8.4* 8.1*  MG 2.1 2.1  --   --   --   --   < > = values in this interval not displayed. Liver Function Tests:  Recent Labs  09/07/14 0437 09/08/14 0511 09/13/14 1720  AST 19 25 24   ALT 23 22 22   ALKPHOS 150* 160* 120  BILITOT 0.2* 0.5 0.6  PROT  7.0 7.1 6.5  ALBUMIN 2.4* 2.4* 1.8*   No results for input(s): LIPASE, AMYLASE in the last 8760 hours. No results for input(s): AMMONIA in the last 8760 hours. CBC:  Recent Labs  09/06/14 0904  09/12/14 1055 09/13/14 1720  09/19/14 0504 09/20/14 0450 09/22/14 0604  WBC 14.4*  < > 16.5* 16.4*  < > 18.1* 17.4* 16.5*  NEUTROABS 12.6*  --  14.2* 14.1*  --   --   --   --   HGB 9.4*  < > 8.9* 9.0*  < > 8.9* 8.6* 9.5*  HCT 30.6*  < > 29.7* 29.7*  < > 29.0* 28.4* 31.2*  MCV 82.0  < > 82.5 80.7  < > 83.8 83.5 85.0  PLT 387  < > 412* 415*  < > 529* 566* 568*  < > = values in this interval not displayed. Cardiac Enzymes:  Recent Labs  01/27/14 2222 01/28/14 0258 03/10/14 1039  TROPONINI <0.30 <0.30 0.05*   BNP: Invalid input(s): POCBNP CBG:  Recent Labs  09/23/14 0743 09/23/14 1136 09/23/14 1644  GLUCAP 101* 130* 148*    Radiological Exams: Dg Chest 1 View  09/07/2014   CLINICAL DATA:  Fluid overload, history CHF, COPD, hypertension, diabetes mellitus, prostate cancer  EXAM: CHEST  1 VIEW  COMPARISON:  Portable exam 1902 hours compared to 09/06/2014  FINDINGS: Enlargement of cardiac silhouette.  Atherosclerotic calcification aorta.  Mediastinal contour stable.  Diffuse pulmonary infiltrates question edema versus less likely infection, increased versus previous study.  No pleural effusion or pneumothorax.  Sclerotic foci at the proximal LEFT humerus question related to metastatic disease  IMPRESSION: Enlargement of cardiac silhouette with diffuse BILATERAL infiltrates favoring pulmonary edema over infection.  Sclerotic foci at the proximal LEFT humerus cannot exclude sclerotic osseous metastases in this patient with a history of prostate cancer.   Electronically Signed   By: Lavonia Dana M.D.   On: 09/07/2014 20:02   Dg Chest Portable 1 View  09/06/2014   CLINICAL DATA:  Lower extremity weakness  EXAM: PORTABLE CHEST - 1 VIEW  COMPARISON:  March 10, 2014  FINDINGS: There is  scarring in both mid lung regions. There is no frank edema or consolidation. Heart is mildly enlarged with pulmonary vascular within normal limits. There is atherosclerotic change in aorta. The bony structures appear stable compared to prior study without focal lesion. Several bony structures appear rather sclerotic a particularly in the medial proximal right humerus, incompletely visualized on this study.  IMPRESSION: Areas of lung scarring. No frank edema or consolidation. Several bones appear somewhat sclerotic. This finding may warrant prostate evaluation given the propensity of prostate carcinoma to present with sclerotic bony metastases.   Electronically Signed   By: Lowella Grip III M.D.   On: 09/06/2014 09:20   Dg Foot Complete Right  09/06/2014   CLINICAL DATA:  Right foot pain, wound.  EXAM: RIGHT FOOT COMPLETE - 3+ VIEW  COMPARISON:  None.  FINDINGS: Soft tissue swelling within the right foot. No acute bony abnormality. No acute fracture, subluxation or dislocation. Possible old healed fractures in the proximal second and third metatarsals. No soft tissue gas.  IMPRESSION: No acute bony abnormality.   Electronically Signed   By: Rolm Baptise M.D.   On: 09/06/2014 14:55     Assessment/Plan  Physical deconditioning Will have him work with physical therapy and occupational therapy team to help with gait training and muscle strengthening exercises.fall precautions. Skin care. Encourage to be out of bed. Poor overall prognosis. Will get  Palliative care consult  altered mental state Confused and agitated this visit. Continue prn xanax for now. Will get chest xray, cbc with diff, cmp to assess for infection  HCAP Recently treated with antibiotics. Coughing at present, refusing exam. Get chest xray to evaluate further. Continue albuterol  ESRD Not a candidate for dialysis, decline anticipated. Palliative care consult  Leukocytosis From infection and stress response in hospital,monitor  cbc with diff  Anemia of chronic disease Monitor h&h  Protein calorie malnutrition Monitor po intake. Continue feeding supplement  Right foot ulcer S/p right AKA. Continue oxyIR 5 mg q3h prn pain, continue wound care  DM Monitor cbg, continue levemir 30 u daily and monitor. Continue aspirin and statin for now  HTN Stable, continue hydralazine, imdur and amlodipine current regimen, monitor bp  Hypothyroidism Continue levothyroxine  Family meeting to be scheduled for goals of care discussion  Palliative care consult for ESRD, has refused dialysis, seen by palliative care in hospital    Goals of care: short term rehabilitation but possible LTC   Labs/tests ordered: cbc with diff, cmp, cxr  Family/ staff Communication: reviewed care plan with patient and nursing supervisor    Blanchie Serve, MD  [facility] [phone] (Monday-Friday 8 am - 5 pm) [phone] (afterhours)

## 2014-10-01 ENCOUNTER — Ambulatory Visit: Payer: Self-pay | Admitting: Internal Medicine

## 2014-10-01 LAB — BASIC METABOLIC PANEL
BUN: 71 mg/dL — AB (ref 4–21)
Creatinine: 2.8 mg/dL — AB (ref 0.6–1.3)
GLUCOSE: 135 mg/dL
Potassium: 5.3 mmol/L (ref 3.4–5.3)
Sodium: 137 mmol/L (ref 137–147)

## 2014-10-01 LAB — HEPATIC FUNCTION PANEL
ALT: 11 U/L (ref 10–40)
AST: 15 U/L (ref 14–40)
Alkaline Phosphatase: 107 U/L (ref 25–125)
Bilirubin, Total: 0.4 mg/dL

## 2014-10-01 LAB — CBC AND DIFFERENTIAL
HCT: 28 % — AB (ref 41–53)
HEMOGLOBIN: 8.3 g/dL — AB (ref 13.5–17.5)
PLATELETS: 405 10*3/uL — AB (ref 150–399)
WBC: 8.4 10*3/mL

## 2014-10-04 ENCOUNTER — Non-Acute Institutional Stay: Payer: Medicare PPO | Admitting: Internal Medicine

## 2014-10-04 ENCOUNTER — Encounter: Payer: Self-pay | Admitting: Internal Medicine

## 2014-10-04 DIAGNOSIS — J189 Pneumonia, unspecified organism: Secondary | ICD-10-CM

## 2014-10-04 DIAGNOSIS — N186 End stage renal disease: Secondary | ICD-10-CM

## 2014-10-04 DIAGNOSIS — R451 Restlessness and agitation: Secondary | ICD-10-CM

## 2014-10-04 NOTE — Progress Notes (Signed)
Patient ID: Travis [name], male   DOB: 01/15/1933, 81 y.o.   MRN: [id number]    [facility]  Code Status: DNR   Chief Complaint  Patient presents with  . Acute Visit    Pneumonia, agitation, and abnormal labs     HPI:  81 y.o. patient is seen for acute concern. He has been agitated and has pneumonia seen on his cxr. He had HCAP in hospital and was on antibiotic. He is currently on o2 and complaints of dyspnea with exertion. Denies any dyspnea at rest.  Review of Systems:  Negative for fever or chills Negative for chest pain or palpitation Negative for abdominal pain, nausea or vomiting. Had bowel movement this am  Past Medical History  Diagnosis Date  . Chronic diastolic heart failure     secondary to diastolic dysfunction EF (previous EF 35-45%)ef 60%4/09  . Arrhythmia     atrial fibrillation /pt on amiodarone,thought to be poor coumadin  . Stroke   . Other and unspecified hyperlipidemia   . Hypertension   . Hypertrophy of prostate without urinary obstruction and other lower urinary tract symptoms (LUTS)   . Osteoarthrosis, unspecified whether generalized or localized, unspecified site   . Type II or unspecified type diabetes mellitus without mention of complication, not stated as uncontrolled   . AV block, Mobitz 1     Intolerance to ACE's / discontiuation of beta blockers  . Obstructive sleep apnea   . Iron deficiency anemia, unspecified     s/p EGD and coloscopy 3/10.gastritis.hemorrhids  . Cerebrovascular accident   . Renal insufficiency   . Obesity   . Allergic rhinitis, cause unspecified 05/29/2010  . CAD (coronary artery disease)     cath 12/04: mLAD 50-60%, mCFX 20-30%, pRCA 50-60%, mRCA 40%  . Normochromic normocytic anemia 04/21/2013  . Abnormal CXR 04/21/2013  . Hypertensive kidney disease with CKD stage III 04/21/2013  . Prostate cancer   . CHF (congestive heart failure)   . COPD (chronic obstructive pulmonary disease)   . Shortness of  breath dyspnea   . Pneumonia    Medication reviewed. See [facility]   Medication List       This list is accurate as of: 10/04/14 12:55 PM.  Always use your most recent med list.               albuterol (2.5 MG/3ML) 0.083% nebulizer solution  Commonly known as:  PROVENTIL  Take 3 mLs (2.5 mg total) by nebulization every 4 (four) hours as needed for wheezing or shortness of breath.     allopurinol 100 MG tablet  Commonly known as:  ZYLOPRIM  Take 1 tablet (100 mg total) by mouth at bedtime.     ALPRAZolam 0.25 MG tablet  Commonly known as:  XANAX  Take 1 tablet (0.25 mg total) by mouth 3 (three) times daily as needed for anxiety.     amiodarone 200 MG tablet  Commonly known as:  PACERONE  Take 0.5 tablets (100 mg total) by mouth every morning.     amLODipine 10 MG tablet  Commonly known as:  NORVASC  TAKE 1 TABLET BY MOUTH EVERY DAY     aspirin 81 MG EC tablet  Take 1 tablet (81 mg total) by mouth daily.     atorvastatin 40 MG tablet  Commonly known as:  LIPITOR  TAKE 1 TABLET BY MOUTH EVERY DAY     citalopram 10 MG tablet  Commonly known as:  CELEXA  Take 20 mg by mouth daily.     Fluticasone-Salmeterol 100-50 MCG/DOSE Aepb  Commonly known as:  ADVAIR  Inhale 1 puff into the lungs as needed (for shortness of breath).     hydrALAZINE 100 MG tablet  Commonly known as:  APRESOLINE  Take 100 mg by mouth 3 (three) times daily.     insulin detemir 100 UNIT/ML injection  Commonly known as:  LEVEMIR  Inject 0.3 mLs (30 Units total) into the skin daily.     isosorbide mononitrate 60 MG 24 hr tablet  Commonly known as:  IMDUR  TAKE 1 TABLET (60 MG TOTAL) BY MOUTH DAILY.     levofloxacin 500 MG tablet  Commonly known as:  LEVAQUIN  Take 500 mg by mouth daily. X 7 days starting 10/01/14     levothyroxine 125 MCG tablet  Commonly known as:  SYNTHROID, LEVOTHROID  Take 125 mcg by mouth every morning.     omeprazole 20 MG capsule  Commonly known as:  PRILOSEC  Take 1  capsule (20 mg total) by mouth daily.     oxyCODONE 5 MG immediate release tablet  Commonly known as:  Oxy IR/ROXICODONE  Take 1 tablet (5 mg total) by mouth every 3 (three) hours as needed for moderate pain.     OXYGEN  Place 3 L into the nose.     potassium chloride SA 20 MEQ tablet  Commonly known as:  K-DUR,KLOR-CON  Take 1 tablet (20 mEq total) by mouth 2 (two) times daily.     saccharomyces boulardii 250 MG capsule  Commonly known as:  FLORASTOR  Take 250 mg by mouth 2 (two) times daily. X 7 days starting 10/01/14     torsemide 20 MG tablet  Commonly known as:  DEMADEX  Take 20 mg by mouth daily.     TRIGELS-F FORTE 460-60-0.01-1 MG Caps capsule  Generic drug:  Fe Fum-Vit C-Vit B12-FA  TAKE 1 CAPSULE BY MOUTH DAILY.     UNABLE TO FIND  Med Name: Med Pass 120 cc by mouth twice daily         Physical exam BP 126/70 mmHg  Pulse 84  Temp(Src) 97.7 F (36.5 C) (Oral)  Resp 19  Wt 225 lb (102.059 kg)  General- elderly male, obese, in no acute distress Head- normocephalic, atraumatic Nose- no nasal discharge, no sinus tenderness Throat- moist mucus membrane Respiratory- no use of accessory muscles, poor air entry with rhonchi Cardiac- normal s1, s2 Musculoskeletal- right AKA, sitting on wheelchair Psychiatry- calm and co-operative this visit  imaging 10/01/14 Chest Xray Findings: Bilateral pulmonary pneumonia with enlargement of the cardiac silhouette. Short-term follow-up is recommended. No destructive lytic lesion is identified involving the bony thorax.  Labs: reviewed  Assessment/plan  Pneumonia Started on levofloxacin and tolerating well. Continue this along with advair and nebulizer treatment, pulmonary toileting and aspiration precautions  Agitation In setting of infection and ESRD with some uremic component. Continue xanax 0.25 mg tid prn for now  ESRD With worsening renal function, monitor clinically as pt is not HD candidate. Palliative care team  to see patient today

## 2014-10-06 ENCOUNTER — Telehealth: Payer: Self-pay | Admitting: Oncology

## 2014-10-06 ENCOUNTER — Ambulatory Visit: Payer: Medicare PPO | Admitting: Oncology

## 2014-10-06 ENCOUNTER — Emergency Department (HOSPITAL_COMMUNITY): Payer: Medicare PPO

## 2014-10-06 ENCOUNTER — Other Ambulatory Visit: Payer: Self-pay

## 2014-10-06 ENCOUNTER — Encounter (HOSPITAL_COMMUNITY): Payer: Self-pay | Admitting: *Deleted

## 2014-10-06 ENCOUNTER — Other Ambulatory Visit (HOSPITAL_COMMUNITY): Payer: Self-pay

## 2014-10-06 ENCOUNTER — Ambulatory Visit: Payer: Medicare PPO

## 2014-10-06 ENCOUNTER — Other Ambulatory Visit: Payer: Medicare PPO

## 2014-10-06 ENCOUNTER — Inpatient Hospital Stay (HOSPITAL_COMMUNITY)
Admission: EM | Admit: 2014-10-06 | Discharge: 2014-10-11 | DRG: 193 | Disposition: A | Discharge status: Hospice | Payer: Medicare PPO | Attending: Internal Medicine | Admitting: Internal Medicine

## 2014-10-06 DIAGNOSIS — Z66 Do not resuscitate: Secondary | ICD-10-CM | POA: Diagnosis present

## 2014-10-06 DIAGNOSIS — I4891 Unspecified atrial fibrillation: Secondary | ICD-10-CM | POA: Diagnosis present

## 2014-10-06 DIAGNOSIS — R0902 Hypoxemia: Secondary | ICD-10-CM | POA: Insufficient documentation

## 2014-10-06 DIAGNOSIS — D638 Anemia in other chronic diseases classified elsewhere: Secondary | ICD-10-CM | POA: Diagnosis present

## 2014-10-06 DIAGNOSIS — J449 Chronic obstructive pulmonary disease, unspecified: Secondary | ICD-10-CM | POA: Diagnosis present

## 2014-10-06 DIAGNOSIS — E1151 Type 2 diabetes mellitus with diabetic peripheral angiopathy without gangrene: Secondary | ICD-10-CM | POA: Diagnosis present

## 2014-10-06 DIAGNOSIS — L89620 Pressure ulcer of left heel, unstageable: Secondary | ICD-10-CM | POA: Diagnosis present

## 2014-10-06 DIAGNOSIS — I5032 Chronic diastolic (congestive) heart failure: Secondary | ICD-10-CM | POA: Diagnosis present

## 2014-10-06 DIAGNOSIS — G9340 Encephalopathy, unspecified: Secondary | ICD-10-CM | POA: Diagnosis present

## 2014-10-06 DIAGNOSIS — Z89611 Acquired absence of right leg above knee: Secondary | ICD-10-CM | POA: Diagnosis not present

## 2014-10-06 DIAGNOSIS — R001 Bradycardia, unspecified: Secondary | ICD-10-CM | POA: Diagnosis not present

## 2014-10-06 DIAGNOSIS — E039 Hypothyroidism, unspecified: Secondary | ICD-10-CM | POA: Diagnosis present

## 2014-10-06 DIAGNOSIS — I480 Paroxysmal atrial fibrillation: Secondary | ICD-10-CM | POA: Diagnosis present

## 2014-10-06 DIAGNOSIS — E785 Hyperlipidemia, unspecified: Secondary | ICD-10-CM | POA: Diagnosis present

## 2014-10-06 DIAGNOSIS — E869 Volume depletion, unspecified: Secondary | ICD-10-CM | POA: Diagnosis present

## 2014-10-06 DIAGNOSIS — I441 Atrioventricular block, second degree: Secondary | ICD-10-CM

## 2014-10-06 DIAGNOSIS — I4892 Unspecified atrial flutter: Secondary | ICD-10-CM | POA: Diagnosis not present

## 2014-10-06 DIAGNOSIS — Y95 Nosocomial condition: Secondary | ICD-10-CM | POA: Diagnosis present

## 2014-10-06 DIAGNOSIS — D469 Myelodysplastic syndrome, unspecified: Secondary | ICD-10-CM | POA: Diagnosis present

## 2014-10-06 DIAGNOSIS — G4733 Obstructive sleep apnea (adult) (pediatric): Secondary | ICD-10-CM | POA: Diagnosis present

## 2014-10-06 DIAGNOSIS — N183 Chronic kidney disease, stage 3 unspecified: Secondary | ICD-10-CM | POA: Diagnosis present

## 2014-10-06 DIAGNOSIS — J9621 Acute and chronic respiratory failure with hypoxia: Secondary | ICD-10-CM | POA: Diagnosis present

## 2014-10-06 DIAGNOSIS — Z9981 Dependence on supplemental oxygen: Secondary | ICD-10-CM

## 2014-10-06 DIAGNOSIS — Z794 Long term (current) use of insulin: Secondary | ICD-10-CM

## 2014-10-06 DIAGNOSIS — I2510 Atherosclerotic heart disease of native coronary artery without angina pectoris: Secondary | ICD-10-CM | POA: Diagnosis present

## 2014-10-06 DIAGNOSIS — Z515 Encounter for palliative care: Secondary | ICD-10-CM | POA: Insufficient documentation

## 2014-10-06 DIAGNOSIS — E871 Hypo-osmolality and hyponatremia: Secondary | ICD-10-CM | POA: Diagnosis present

## 2014-10-06 DIAGNOSIS — Z87891 Personal history of nicotine dependence: Secondary | ICD-10-CM

## 2014-10-06 DIAGNOSIS — N184 Chronic kidney disease, stage 4 (severe): Secondary | ICD-10-CM | POA: Diagnosis present

## 2014-10-06 DIAGNOSIS — L8990 Pressure ulcer of unspecified site, unspecified stage: Secondary | ICD-10-CM | POA: Insufficient documentation

## 2014-10-06 DIAGNOSIS — M1990 Unspecified osteoarthritis, unspecified site: Secondary | ICD-10-CM | POA: Diagnosis present

## 2014-10-06 DIAGNOSIS — J9620 Acute and chronic respiratory failure, unspecified whether with hypoxia or hypercapnia: Secondary | ICD-10-CM | POA: Diagnosis present

## 2014-10-06 DIAGNOSIS — E1129 Type 2 diabetes mellitus with other diabetic kidney complication: Secondary | ICD-10-CM | POA: Diagnosis present

## 2014-10-06 DIAGNOSIS — Z8546 Personal history of malignant neoplasm of prostate: Secondary | ICD-10-CM | POA: Diagnosis not present

## 2014-10-06 DIAGNOSIS — J189 Pneumonia, unspecified organism: Principal | ICD-10-CM | POA: Diagnosis present

## 2014-10-06 DIAGNOSIS — Z8673 Personal history of transient ischemic attack (TIA), and cerebral infarction without residual deficits: Secondary | ICD-10-CM

## 2014-10-06 DIAGNOSIS — E1122 Type 2 diabetes mellitus with diabetic chronic kidney disease: Secondary | ICD-10-CM | POA: Diagnosis present

## 2014-10-06 DIAGNOSIS — N189 Chronic kidney disease, unspecified: Secondary | ICD-10-CM | POA: Diagnosis not present

## 2014-10-06 DIAGNOSIS — S81802A Unspecified open wound, left lower leg, initial encounter: Secondary | ICD-10-CM | POA: Diagnosis present

## 2014-10-06 DIAGNOSIS — I4430 Unspecified atrioventricular block: Secondary | ICD-10-CM | POA: Diagnosis present

## 2014-10-06 DIAGNOSIS — R0602 Shortness of breath: Secondary | ICD-10-CM | POA: Diagnosis not present

## 2014-10-06 DIAGNOSIS — Z7189 Other specified counseling: Secondary | ICD-10-CM | POA: Insufficient documentation

## 2014-10-06 LAB — CBC WITH DIFFERENTIAL/PLATELET
BASOS ABS: 0 10*3/uL (ref 0.0–0.1)
BASOS PCT: 0 % (ref 0–1)
EOS ABS: 0.2 10*3/uL (ref 0.0–0.7)
Eosinophils Relative: 2 % (ref 0–5)
HCT: 29.7 % — ABNORMAL LOW (ref 39.0–52.0)
HEMOGLOBIN: 8.6 g/dL — AB (ref 13.0–17.0)
LYMPHS PCT: 8 % — AB (ref 12–46)
Lymphs Abs: 0.7 10*3/uL (ref 0.7–4.0)
MCH: 26.2 pg (ref 26.0–34.0)
MCHC: 29 g/dL — ABNORMAL LOW (ref 30.0–36.0)
MCV: 90.5 fL (ref 78.0–100.0)
MONO ABS: 0.4 10*3/uL (ref 0.1–1.0)
Monocytes Relative: 4 % (ref 3–12)
NEUTROS PCT: 86 % — AB (ref 43–77)
Neutro Abs: 8 10*3/uL — ABNORMAL HIGH (ref 1.7–7.7)
PLATELETS: 363 10*3/uL (ref 150–400)
RBC: 3.28 MIL/uL — AB (ref 4.22–5.81)
RDW: 26.4 % — ABNORMAL HIGH (ref 11.5–15.5)
WBC: 9.3 10*3/uL (ref 4.0–10.5)

## 2014-10-06 LAB — I-STAT ARTERIAL BLOOD GAS, ED
ACID-BASE DEFICIT: 5 mmol/L — AB (ref 0.0–2.0)
Bicarbonate: 18.8 mEq/L — ABNORMAL LOW (ref 20.0–24.0)
O2 SAT: 87 %
PH ART: 7.429 (ref 7.350–7.450)
Patient temperature: 98.6
TCO2: 20 mmol/L (ref 0–100)
pCO2 arterial: 28.3 mmHg — ABNORMAL LOW (ref 35.0–45.0)
pO2, Arterial: 51 mmHg — ABNORMAL LOW (ref 80.0–100.0)

## 2014-10-06 LAB — BASIC METABOLIC PANEL
ANION GAP: 10 (ref 5–15)
BUN: 71 mg/dL — ABNORMAL HIGH (ref 6–20)
CHLORIDE: 115 mmol/L — AB (ref 101–111)
CO2: 19 mmol/L — ABNORMAL LOW (ref 22–32)
Calcium: 8.6 mg/dL — ABNORMAL LOW (ref 8.9–10.3)
Creatinine, Ser: 2.81 mg/dL — ABNORMAL HIGH (ref 0.61–1.24)
GFR calc non Af Amer: 20 mL/min — ABNORMAL LOW (ref >60)
GFR, EST AFRICAN AMERICAN: 23 mL/min — AB (ref >60)
Glucose, Bld: 108 mg/dL — ABNORMAL HIGH (ref 65–99)
POTASSIUM: 4.6 mmol/L (ref 3.5–5.1)
Sodium: 144 mmol/L (ref 135–145)

## 2014-10-06 LAB — URINALYSIS, ROUTINE W REFLEX MICROSCOPIC
Bilirubin Urine: NEGATIVE
GLUCOSE, UA: NEGATIVE mg/dL
Hgb urine dipstick: NEGATIVE
KETONES UR: NEGATIVE mg/dL
NITRITE: NEGATIVE
PROTEIN: 30 mg/dL — AB
Specific Gravity, Urine: 1.012 (ref 1.005–1.030)
UROBILINOGEN UA: 0.2 mg/dL (ref 0.0–1.0)
pH: 5 (ref 5.0–8.0)

## 2014-10-06 LAB — CBG MONITORING, ED: GLUCOSE-CAPILLARY: 108 mg/dL — AB (ref 65–99)

## 2014-10-06 LAB — URINE MICROSCOPIC-ADD ON

## 2014-10-06 LAB — I-STAT CG4 LACTIC ACID, ED: Lactic Acid, Venous: 1.43 mmol/L (ref 0.5–2.0)

## 2014-10-06 MED ORDER — DEXTROSE 5 % IV SOLN
2.0000 g | INTRAVENOUS | Status: DC
  Administered 2014-10-06: 2 g via INTRAVENOUS
  Filled 2014-10-06 (×3): qty 2

## 2014-10-06 MED ORDER — OXYCODONE HCL 5 MG PO TABS
5.0000 mg | ORAL_TABLET | Freq: Once | ORAL | Status: AC
  Administered 2014-10-06: 5 mg via ORAL
  Filled 2014-10-06: qty 1

## 2014-10-06 MED ORDER — VANCOMYCIN HCL 10 G IV SOLR
2000.0000 mg | Freq: Once | INTRAVENOUS | Status: AC
  Administered 2014-10-06: 2000 mg via INTRAVENOUS
  Filled 2014-10-06: qty 2000

## 2014-10-06 MED ORDER — IPRATROPIUM-ALBUTEROL 0.5-2.5 (3) MG/3ML IN SOLN
3.0000 mL | Freq: Once | RESPIRATORY_TRACT | Status: AC
  Administered 2014-10-06: 3 mL via RESPIRATORY_TRACT
  Filled 2014-10-06: qty 3

## 2014-10-06 MED ORDER — VANCOMYCIN HCL 10 G IV SOLR
1500.0000 mg | INTRAVENOUS | Status: DC
  Administered 2014-10-08: 1500 mg via INTRAVENOUS
  Filled 2014-10-06 (×2): qty 1500

## 2014-10-06 NOTE — ED Notes (Addendum)
The pts daughter reports that he had a rt ak amp 2 weeks ago.  He also has bandages to his lt lower leg.  He has decubitus ulcers over his sacral area he has pads on those from the nursing home

## 2014-10-06 NOTE — ED Provider Notes (Signed)
I saw and evaluated the patient, reviewed the resident's note and I agree with the findings and plan.   EKG Interpretation None      Results for orders placed or performed during the hospital encounter of 10/06/14  CBC with Differential  Result Value Ref Range   WBC 9.3 4.0 - 10.5 K/uL   RBC 3.28 (L) 4.22 - 5.81 MIL/uL   Hemoglobin 8.6 (L) 13.0 - 17.0 g/dL   HCT 29.7 (L) 39.0 - 52.0 %   MCV 90.5 78.0 - 100.0 fL   MCH 26.2 26.0 - 34.0 pg   MCHC 29.0 (L) 30.0 - 36.0 g/dL   RDW 26.4 (H) 11.5 - 15.5 %   Platelets 363 150 - 400 K/uL   Neutrophils Relative % 86 (H) 43 - 77 %   Lymphocytes Relative 8 (L) 12 - 46 %   Monocytes Relative 4 3 - 12 %   Eosinophils Relative 2 0 - 5 %   Basophils Relative 0 0 - 1 %   Neutro Abs 8.0 (H) 1.7 - 7.7 K/uL   Lymphs Abs 0.7 0.7 - 4.0 K/uL   Monocytes Absolute 0.4 0.1 - 1.0 K/uL   Eosinophils Absolute 0.2 0.0 - 0.7 K/uL   Basophils Absolute 0.0 0.0 - 0.1 K/uL   RBC Morphology POLYCHROMASIA PRESENT   Basic metabolic panel  Result Value Ref Range   Sodium 144 135 - 145 mmol/L   Potassium 4.6 3.5 - 5.1 mmol/L   Chloride 115 (H) 101 - 111 mmol/L   CO2 19 (L) 22 - 32 mmol/L   Glucose, Bld 108 (H) 65 - 99 mg/dL   BUN 71 (H) 6 - 20 mg/dL   Creatinine, Ser 2.81 (H) 0.61 - 1.24 mg/dL   Calcium 8.6 (L) 8.9 - 10.3 mg/dL   GFR calc non Af Amer 20 (L) >60 mL/min   GFR calc Af Amer 23 (L) >60 mL/min   Anion gap 10 5 - 15  I-Stat CG4 Lactic Acid, ED  Result Value Ref Range   Lactic Acid, Venous 1.43 0.5 - 2.0 mmol/L  I-Stat Arterial Blood Gas, ED - (order at [facility] and [facility] only)  Result Value Ref Range   pH, Arterial 7.429 7.350 - 7.450   pCO2 arterial 28.3 (L) 35.0 - 45.0 mmHg   pO2, Arterial 51.0 (L) 80.0 - 100.0 mmHg   Bicarbonate 18.8 (L) 20.0 - 24.0 mEq/L   TCO2 20 0 - 100 mmol/L   O2 Saturation 87.0 %   Acid-base deficit 5.0 (H) 0.0 - 2.0 mmol/L   Patient temperature 98.6 F    Collection site RADIAL, ALLEN'S TEST ACCEPTABLE    Drawn by Operator     Sample type ARTERIAL    Dg Chest 1 View  09/07/2014   CLINICAL DATA:  Fluid overload, history CHF, COPD, hypertension, diabetes mellitus, prostate cancer  EXAM: CHEST  1 VIEW  COMPARISON:  Portable exam 1902 hours compared to 09/06/2014  FINDINGS: Enlargement of cardiac silhouette.  Atherosclerotic calcification aorta.  Mediastinal contour stable.  Diffuse pulmonary infiltrates question edema versus less likely infection, increased versus previous study.  No pleural effusion or pneumothorax.  Sclerotic foci at the proximal LEFT humerus question related to metastatic disease  IMPRESSION: Enlargement of cardiac silhouette with diffuse BILATERAL infiltrates favoring pulmonary edema over infection.  Sclerotic foci at the proximal LEFT humerus cannot exclude sclerotic osseous metastases in this patient with a history of prostate cancer.   Electronically Signed   By: Crist Infante.D.  On: 09/07/2014 20:02   Dg Chest 2 View  09/09/2014   CLINICAL DATA:  81 year old male with fever, cough and congestion  EXAM: CHEST  2 VIEW  COMPARISON:  Prior chest x-ray 09/07/2014  FINDINGS: Overall, there has been some improvement in diffuse bilateral interstitial and airspace opacities. However, there remains some focal airspace opacification within the right mid lung. No pleural effusion. No pneumothorax. Stable cardiomegaly and mediastinal contours. Atherosclerotic calcification remains present within the transverse aorta.  IMPRESSION: 1. Improving pulmonary edema. 2. Residual patchy airspace opacity in the right mid lung in the region of the minor fissure may represent superimposed infiltrate/pneumonia, or perhaps some fluid trapped within the minor fissure. 3. Stable cardiomegaly.   Electronically Signed   By: Jacqulynn Cadet M.D.   On: 09/09/2014 10:21   US Renal  09/17/2014   CLINICAL DATA:  Acute renal insufficiency  EXAM: RENAL / URINARY TRACT ULTRASOUND COMPLETE  COMPARISON:  None.  FINDINGS: Right Kidney:   Length: 10.2 cm. Echogenicity within normal limits. No mass or hydronephrosis visualized.  Left Kidney:  Length: 10.5 cm.  Limited views, negative for hydronephrosis.  Bladder:  Not seen.  IMPRESSION: Negative for hydronephrosis.  Limited views of the left kidney.   Electronically Signed   By: Andreas Newport M.D.   On: 09/17/2014 02:44   Nm Pulmonary Perf And Vent  09/08/2014   CLINICAL DATA:  Shortness of breath.  EXAM: NUCLEAR MEDICINE VENTILATION - PERFUSION LUNG SCAN  TECHNIQUE: Ventilation images were obtained in multiple projections using inhaled aerosol Tc-34m DTPA. Perfusion images were obtained in multiple projections after intravenous injection of Tc-33m MAA.  RADIOPHARMACEUTICALS:  39.2 Technetium-78m DTPA aerosol inhalation and 6.45 Technetium-48m MAA IV  COMPARISON:  Chest x-ray 09/07/2014.  FINDINGS: Ventilation: Severe bilateral ventilatory defects. No segmental sized perfusion defects noted.  Perfusion: No wedge shaped peripheral perfusion defects to suggest acute pulmonary embolism.  IMPRESSION: Severe bilateral ventilatory defects consistent with bilateral airspace disease noted on recent chest x-ray. No perfusion defects noted to suggest pulmonary embolus. Low probability scan for pulmonary embolus.   Electronically Signed   By: Marcello Moores  Register   On: 09/08/2014 12:12   Dg Chest Portable 1 View  10/06/2014   CLINICAL DATA:  Shortness of breath and wheezing.  Possible fever.  EXAM: PORTABLE CHEST - 1 VIEW  COMPARISON:  Single view of the chest 09/18/2014 and 09/15/2014.  FINDINGS: Extensive bilateral airspace disease seen on the most recent examination persists and appears somewhat worsened in the left mid lung zone. Aeration in the right chest appears mildly improved. There is cardiomegaly. No pneumothorax or pleural effusion is identified.  IMPRESSION: Multifocal airspace disease has an appearance most compatible with pneumonia. There is increased airspace opacity in the left mid lung  zone. Aeration in the right chest appears somewhat improved.   Electronically Signed   By: Inge Rise M.D.   On: 10/06/2014 16:22   Dg Chest Port 1 View  09/18/2014   CLINICAL DATA:  Leukocytosis  EXAM: PORTABLE CHEST - 1 VIEW  COMPARISON:  09/15/2014  FINDINGS: Multifocal patchy opacities in the right upper and lower lobes and left prior hilar region, suspicious for multifocal pneumonia, less likely interstitial edema. No definite pleural effusions. No pneumothorax.  The heart is top-normal in size.  IMPRESSION: Suspected multifocal pneumonia in the bilateral upper lobes and right lower lobe, grossly unchanged.   Electronically Signed   By: Julian Hy M.D.   On: 09/18/2014 08:46   Dg Chest Port 1  View  09/15/2014   CLINICAL DATA:  Fever and difficulty breathing  EXAM: PORTABLE CHEST - 1 VIEW  COMPARISON:  September 09, 2014  FINDINGS: There is generalized interstitial edema. There remains slight alveolar consolidation in the mid right lung, less than on recent prior study. Heart is enlarged. The pulmonary vascular is within normal limits. No adenopathy.  IMPRESSION: Findings consistent with congestive heart failure. A small focus of pneumonia in the right mid lung cannot be excluded.   Electronically Signed   By: Lowella Grip III M.D.   On: 09/15/2014 16:53    Patient brought in from rehabilitation with persistent pneumonia suspect it's been ongoing since last admission. Probably partially treated. This be consistent with a healthcare acquired pneumonia. Patient restarted on Anna bodyaches. Patient's Terrell blood gas shows no retention of carbon dioxide. PH is also normal at 7.4. Patient's oxygenation is borderline but acceptable at the 52. Patient will need to be continued on oxygen will get antibiotics and will require admission.    Fredia Sorrow, MD 10/06/14 1736

## 2014-10-06 NOTE — Telephone Encounter (Signed)
pt daughter CX appt-pt went to ED

## 2014-10-06 NOTE — ED Notes (Signed)
Pt checked for a bm .Marland Kitchen None seen cleaned some small urine.  Lab in room drawing his blood work

## 2014-10-06 NOTE — Progress Notes (Signed)
ANTIBIOTIC CONSULT NOTE - INITIAL  Pharmacy Consult for Vancomycin Indication: HCAP  Allergies  Allergen Reactions  . Ace Inhibitors Other (See Comments)    HYPOTENSION  . Beta Adrenergic Blockers Other (See Comments)     use cautiously secondary to 2nd degree heart block, hypotension    Patient Measurements:    Vital Signs: Temp: 97.8 F (36.6 C) (08/17 1519) Temp Source: Rectal (08/17 1519) BP: 143/48 mmHg (08/17 1559) Pulse Rate: 78 (08/17 1559) Intake/Output from previous day:   Intake/Output from this shift:    Labs:  Recent Labs  10/06/14 1520  WBC 9.3  HGB 8.6*  PLT 363  CREATININE 2.81*   Estimated Creatinine Clearance: 25.3 mL/min (by C-G formula based on Cr of 2.81). No results for input(s): VANCOTROUGH, VANCOPEAK, VANCORANDOM, GENTTROUGH, GENTPEAK, GENTRANDOM, TOBRATROUGH, TOBRAPEAK, TOBRARND, AMIKACINPEAK, AMIKACINTROU, AMIKACIN in the last 72 hours.   Microbiology: Recent Results (from the past 720 hour(s))  MRSA PCR Screening     Status: None   Collection Time: 09/06/14 10:07 PM  Result Value Ref Range Status   MRSA by PCR NEGATIVE NEGATIVE Final    Comment:        The GeneXpert MRSA Assay (FDA approved for NASAL specimens only), is one component of a comprehensive MRSA colonization surveillance program. It is not intended to diagnose MRSA infection nor to guide or monitor treatment for MRSA infections.   Culture, blood (routine x 2)     Status: None   Collection Time: 09/15/14  3:20 PM  Result Value Ref Range Status   Specimen Description BLOOD LEFT ARM  Final   Special Requests BOTTLES DRAWN AEROBIC AND ANAEROBIC 10CC  Final   Culture NO GROWTH 5 DAYS  Final   Report Status 09/20/2014 FINAL  Final  Culture, blood (routine x 2)     Status: None   Collection Time: 09/15/14  3:30 PM  Result Value Ref Range Status   Specimen Description BLOOD LEFT HAND  Final   Special Requests BOTTLES DRAWN AEROBIC ONLY 3CC  Final   Culture NO GROWTH 5  DAYS  Final   Report Status 09/20/2014 FINAL  Final  Culture, Urine     Status: None   Collection Time: 09/16/14  1:44 AM  Result Value Ref Range Status   Specimen Description URINE, RANDOM  Final   Special Requests NONE  Final   Culture 30,000 COLONIES/mL YEAST  Final   Report Status 09/17/2014 FINAL  Final    Medical History: Past Medical History  Diagnosis Date  . Chronic diastolic heart failure     secondary to diastolic dysfunction EF (previous EF 35-45%)ef 60%4/09  . Arrhythmia     atrial fibrillation /pt on amiodarone,thought to be poor coumadin  . Stroke   . Other and unspecified hyperlipidemia   . Hypertension   . Hypertrophy of prostate without urinary obstruction and other lower urinary tract symptoms (LUTS)   . Osteoarthrosis, unspecified whether generalized or localized, unspecified site   . Type II or unspecified type diabetes mellitus without mention of complication, not stated as uncontrolled   . AV block, Mobitz 1     Intolerance to ACE's / discontiuation of beta blockers  . Obstructive sleep apnea   . Iron deficiency anemia, unspecified     s/p EGD and coloscopy 3/10.gastritis.hemorrhids  . Cerebrovascular accident   . Renal insufficiency   . Obesity   . Allergic rhinitis, cause unspecified 05/29/2010  . CAD (coronary artery disease)     cath 12/04: mLAD  50-60%, mCFX 20-30%, pRCA 50-60%, mRCA 40%  . Normochromic normocytic anemia 04/21/2013  . Abnormal CXR 04/21/2013  . Hypertensive kidney disease with CKD stage III 04/21/2013  . Prostate cancer   . CHF (congestive heart failure)   . COPD (chronic obstructive pulmonary disease)   . Shortness of breath dyspnea   . Pneumonia     Medications:  8/17>>Ceftazidime 8/17>>Vancomycin  Assessment: 81 y/o male presenting 8/17 with complaints of SOB. He was on his way to the [place] [facility] when he developed worsening SOB. Patient with recent admission of HCAP, suspect this is still going on. Pharmacy to dose  Vancomycin for pna.   WBC 9.3 / afebrile   8/17 BCx x 2>>in process   8/17>>vancomycin  8/17>>ceftazidime   Nephrology: SCr 2.81 (baseline variable). CrCl ~25  Goal of Therapy:  Vancomycin Trough 15-20  Plan:  -Vancomycin 2000mg  X 1  -Vancomycin 1500mg  Q48 hours  -F/U c/s, clinical course  -F/U renal fxn  Aziel Morgan C. Lennox Grumbles, PharmD Pharmacy Resident  Pager: [phone] 10/06/2014 6:07 PM

## 2014-10-06 NOTE — ED Notes (Signed)
Rocephin held for blood cultures to be drawn.    The pts daughter wants him toi have his kidney shot before we draw any more blood.  Lab delayed he was in the room to draw his blood when the daughter tokld him not to draw

## 2014-10-06 NOTE — ED Notes (Signed)
Admitting doctor at the bedside 

## 2014-10-06 NOTE — ED Notes (Signed)
The pt arrived by [facility] from [facility].  He was on his way to the cancer center at [facility] long  When he became more sob.  He has been  More confused for the past 2 days and more sob.
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
## 2014-10-06 NOTE — H&P (Signed)
[facility] History and Physical  Nicodemus Denk QIW:[id number] DOB: 02/04/1933 DOA: 10/06/2014  Referring physician: EDP PCP: [name], MD   Chief Complaint: Shortness of breath  HPI: Travis [name] is a 81 y.o. male multiple medical problems most significant for advancing CKD IV diastolic CHF, myelodysplastic syndrome, type 2 diabetes, hypothyroidism, atrial fibrillation, history of CVA, history of CAD, severe peripheral vascular disease, was just discharged from [facility] 2 weeks ago, he underwent right above-knee amputation for necrotic right foot and leg he also has ischemic changes in his left leg with wounds, he was seen by palliative care during that hospitalization and initially there was discussion about residential hospice he subsequently went to skilled nursing facility on 8/4. He was seen by renal during this admission and not felt to be a dialysis candidate due to comorbidities. Today was going to [facility] from SNF to get his Epo shot and was subsequently diverted to [facility] ER due to worsening shortness of breath. Patient is confused and unable to provide any reasonable history, daughter does report increasing oxygen demand over the last few days to a week and increasing confusion. No history of fevers or chills In the emergency room noted to have some improvement in his kidney function, requiring 6 L O2 and chest x-ray with multifocal airspace disease most consistent with pneumonia.   Review of Systems: Unable to obtain due to confusion Constitutional:  No weight loss, night sweats, Fevers, chills, fatigue.  HEENT:  No headaches, Difficulty swallowing,Tooth/dental problems,Sore throat,  No sneezing, itching, ear ache, nasal congestion, post nasal drip,  Cardio-vascular:  No chest pain, Orthopnea, PND, swelling in lower extremities, anasarca, dizziness, palpitations  GI:  No heartburn, indigestion, abdominal pain, nausea, vomiting,  diarrhea, change in bowel habits, loss of appetite  Resp:  No shortness of breath with exertion or at rest. No excess mucus, no productive cough, No non-productive cough, No coughing up of blood.No change in color of mucus.No wheezing.No chest wall deformity  Skin:  no rash or lesions.  GU:  no dysuria, change in color of urine, no urgency or frequency. No flank pain.  Musculoskeletal:  No joint pain or swelling. No decreased range of motion. No back pain.  Psych:  No change in mood or affect. No depression or anxiety. No memory loss.   Past Medical History  Diagnosis Date  . Chronic diastolic heart failure     secondary to diastolic dysfunction EF (previous EF 35-45%)ef 60%4/09  . Arrhythmia     atrial fibrillation /pt on amiodarone,thought to be poor coumadin  . Stroke   . Other and unspecified hyperlipidemia   . Hypertension   . Hypertrophy of prostate without urinary obstruction and other lower urinary tract symptoms (LUTS)   . Osteoarthrosis, unspecified whether generalized or localized, unspecified site   . Type II or unspecified type diabetes mellitus without mention of complication, not stated as uncontrolled   . AV block, Mobitz 1     Intolerance to ACE's / discontiuation of beta blockers  . Obstructive sleep apnea   . Iron deficiency anemia, unspecified     s/p EGD and coloscopy 3/10.gastritis.hemorrhids  . Cerebrovascular accident   . Renal insufficiency   . Obesity   . Allergic rhinitis, cause unspecified 05/29/2010  . CAD (coronary artery disease)     cath 12/04: mLAD 50-60%, mCFX 20-30%, pRCA 50-60%, mRCA 40%  . Normochromic normocytic anemia 04/21/2013  . Abnormal CXR 04/21/2013  . Hypertensive kidney  disease with CKD stage III 04/21/2013  . Prostate cancer   . CHF (congestive heart failure)   . COPD (chronic obstructive pulmonary disease)   . Shortness of breath dyspnea   . Pneumonia    Past Surgical History  Procedure Laterality Date  . Prostate surgery    .  Prostate surgury      hx of  . Esophagogastroduodenoscopy N/A 04/21/2013    Procedure: ESOPHAGOGASTRODUODENOSCOPY (EGD);  Surgeon: Irene Shipper, MD;  Location: [facility];  Service: Endoscopy;  Laterality: N/A;  . Amputation Right 09/13/2014    Procedure: AMPUTATION ABOVE KNEE;  Surgeon: Newt Minion, MD;  Location: [place];  Service: Orthopedics;  Laterality: Right;   Social History:  reports that he has quit smoking. He has never used smokeless tobacco. He reports that he does not drink alcohol or use illicit drugs.  Allergies  Allergen Reactions  . Ace Inhibitors Other (See Comments)    HYPOTENSION  . Beta Adrenergic Blockers Other (See Comments)     use cautiously secondary to 2nd degree heart block, hypotension    Family History  Problem Relation Age of Onset  . Heart disease Father   . Dementia Mother   . CAD Sister     Prior to Admission medications   Medication Sig Start Date End Date Taking? Authorizing Provider  albuterol (PROVENTIL) (2.5 MG/3ML) 0.083% nebulizer solution Take 3 mLs (2.5 mg total) by nebulization every 4 (four) hours as needed for wheezing or shortness of breath. 09/15/14   Maryann Mikhail, DO  allopurinol (ZYLOPRIM) 100 MG tablet Take 1 tablet (100 mg total) by mouth at bedtime. 12/30/13   Biagio Borg, MD  ALPRAZolam Duanne Moron) 0.25 MG tablet Take 1 tablet (0.25 mg total) by mouth 3 (three) times daily as needed for anxiety. 09/15/14   Maryann Mikhail, DO  amiodarone (PACERONE) 200 MG tablet Take 0.5 tablets (100 mg total) by mouth every morning. 03/19/14   Shaune Pascal Bensimhon, MD  amLODipine (NORVASC) 10 MG tablet TAKE 1 TABLET BY MOUTH EVERY DAY 06/21/14   Jolaine Artist, MD  aspirin EC 81 MG EC tablet Take 1 tablet (81 mg total) by mouth daily. Patient not taking: Reported on 10/04/2014 03/13/14   Brett Canales, PA-C  atorvastatin (LIPITOR) 40 MG tablet TAKE 1 TABLET BY MOUTH EVERY DAY 08/02/14   Jolaine Artist, MD  citalopram (CELEXA) 10 MG tablet Take 20  mg by mouth daily.  07/29/12   Biagio Borg, MD  Fluticasone-Salmeterol (ADVAIR) 100-50 MCG/DOSE AEPB Inhale 1 puff into the lungs as needed (for shortness of breath).  06/05/12   Renato Shin, MD  hydrALAZINE (APRESOLINE) 100 MG tablet Take 100 mg by mouth 3 (three) times daily. 01/13/14   Historical Provider, MD  insulin detemir (LEVEMIR) 100 UNIT/ML injection Inject 0.3 mLs (30 Units total) into the skin daily. 09/23/14   Velvet Bathe, MD  isosorbide mononitrate (IMDUR) 60 MG 24 hr tablet TAKE 1 TABLET (60 MG TOTAL) BY MOUTH DAILY. 08/24/14   Jolaine Artist, MD  levofloxacin (LEVAQUIN) 500 MG tablet Take 500 mg by mouth daily. X 7 days starting 10/01/14    Historical Provider, MD  levothyroxine (SYNTHROID, LEVOTHROID) 125 MCG tablet Take 125 mcg by mouth every morning. 12/26/13   Historical Provider, MD  omeprazole (PRILOSEC) 20 MG capsule Take 1 capsule (20 mg total) by mouth daily. 04/05/14   Jolaine Artist, MD  oxyCODONE (OXY IR/ROXICODONE) 5 MG immediate release tablet Take 1 tablet (5  mg total) by mouth every 3 (three) hours as needed for moderate pain. 09/15/14   Maryann Mikhail, DO  OXYGEN Place 3 L into the nose.    Historical Provider, MD  potassium chloride SA (K-DUR,KLOR-CON) 20 MEQ tablet Take 1 tablet (20 mEq total) by mouth 2 (two) times daily. 10/04/14   Blanchie Serve, MD  saccharomyces boulardii (FLORASTOR) 250 MG capsule Take 250 mg by mouth 2 (two) times daily. X 7 days starting 10/01/14    Historical Provider, MD  torsemide (DEMADEX) 20 MG tablet Take 20 mg by mouth daily.    Historical Provider, MD  TRIGELS-F FORTE 460-60-0.01-1 MG CAPS capsule TAKE 1 CAPSULE BY MOUTH DAILY. 02/22/14   Biagio Borg, MD  UNABLE TO FIND Med Name: Med Pass 120 cc by mouth twice daily    Historical Provider, MD   Physical Exam: Filed Vitals:   10/06/14 1642 10/06/14 1700 10/06/14 1730 10/06/14 1800  BP:  147/50 135/49 135/61  Pulse:  67 65 144  Temp:      TempSrc:      Resp:  21 20 31   SpO2: 93%  95% 96% 88%    Wt Readings from Last 3 Encounters:  10/04/14 102.059 kg (225 lb)  09/30/14 101.56 kg (223 lb 14.4 oz)  09/22/14 107.591 kg (237 lb 3.1 oz)    General:  Elderly male, confused, no distress, oriented to self only Eyes: PERRL, normal lids, irises & conjunctiva ENT: grossly normal hearing, lips & tongue Neck: no LAD, masses or thyromegaly Cardiovascular: RRR, no m/r/g. No LE edema. Telemetry: SR, no arrhythmias  Respiratory: ronchi at both bases Abdomen: soft, NT, distended, BS present Musculoskeletal: R AKA, L leg with ischemic changes in lower legs and 2 small ulcers with purulent drainage Psychiatric: confused Neurologic: grossly non-focal, confused          Labs on Admission:  Basic Metabolic Panel:  Recent Labs Lab 10/01/14 10/06/14 1520  NA 137 144  K 5.3 4.6  CL  --  115*  CO2  --  19*  GLUCOSE  --  108*  BUN 71* 71*  CREATININE 2.8* 2.81*  CALCIUM  --  8.6*   Liver Function Tests:  Recent Labs Lab 10/01/14  AST 15  ALT 11  ALKPHOS 107   No results for input(s): LIPASE, AMYLASE in the last 168 hours. No results for input(s): AMMONIA in the last 168 hours. CBC:  Recent Labs Lab 10/01/14 10/06/14 1520  WBC 8.4 9.3  NEUTROABS  --  8.0*  HGB 8.3* 8.6*  HCT 28* 29.7*  MCV  --  90.5  PLT 405* 363   Cardiac Enzymes: No results for input(s): CKTOTAL, CKMB, CKMBINDEX, TROPONINI in the last 168 hours.  BNP (last 3 results)  Recent Labs  03/10/14 1039 04/28/14 1530  BNP 456.3* 153.7*    ProBNP (last 3 results)  Recent Labs  10/21/13 2012 11/28/13 1215 01/27/14 1709  PROBNP 1541.0* 1597.0* 2020.0*    CBG:  Recent Labs Lab 10/06/14 1736  GLUCAP 108*    Radiological Exams on Admission: Dg Chest Portable 1 View  10/06/2014   CLINICAL DATA:  Shortness of breath and wheezing.  Possible fever.  EXAM: PORTABLE CHEST - 1 VIEW  COMPARISON:  Single view of the chest 09/18/2014 and 09/15/2014.  FINDINGS: Extensive bilateral  airspace disease seen on the most recent examination persists and appears somewhat worsened in the left mid lung zone. Aeration in the right chest appears mildly improved. There is cardiomegaly. No  pneumothorax or pleural effusion is identified.  IMPRESSION: Multifocal airspace disease has an appearance most compatible with pneumonia. There is increased airspace opacity in the left mid lung zone. Aeration in the right chest appears somewhat improved.   Electronically Signed   By: Inge Rise M.D.   On: 10/06/2014 16:22    EKG: Independently reviewed. Afib, non specific T wave changes  Assessment/Plan   Respiratory failure, Acute and chronic -Multifactorial suspect component of multifocal pneumonia as well as diastolic heart failure -We'll admit to stepdown unit, start broad-spectrum IV antibiotic vancomycin and Fortaz -Follow blood cultures -Diurese with IV Lasix 80 mg every 12 follow I's and O's and weights -Check swallow evaluation suspect component of aspiration    HCAP (healthcare-associated pneumonia) -As above     Type 2 diabetes mellitus with renal manifestations -Continue Levemir at a lower dose, sliding scale insulin     Peripheral vascular disease status post right AKA and ischemic changes in left leg -Wound care    Myelodysplastic syndrome-hx of transfusions -Requiring intermittent transfusions due to CKD and MDS -hb stable -Was supposed to get EPo shot today will ask pharmacy to determine dose and administer    Chronic renal insufficiency, stage IV  -Suspect creatinine is better than recent baseline due to volume overload noted state, monitor with diuresis, was not felt to be a dialysis candidate by renal few weeks ago -1 daughter is asking for dialysis if needed and the other thinks that this is inappropriate -Will ask palliative to see to clarify goals of care again since this will continue to be a subject of contention  Hypertension  -Continue home meds   Code  Status: DNR, based on Palliative notes and progress notes 2 weeks ago, didn't discuss this again DVT Prophylaxis: hep SQ Family Communication:daughters at bedside Disposition Plan: admit to SDU  Time spent: 37min  Levaeh Vice [facility] Pager [phone]

## 2014-10-07 DIAGNOSIS — J9620 Acute and chronic respiratory failure, unspecified whether with hypoxia or hypercapnia: Secondary | ICD-10-CM

## 2014-10-07 DIAGNOSIS — J189 Pneumonia, unspecified organism: Principal | ICD-10-CM

## 2014-10-07 DIAGNOSIS — R0902 Hypoxemia: Secondary | ICD-10-CM

## 2014-10-07 DIAGNOSIS — J9621 Acute and chronic respiratory failure with hypoxia: Secondary | ICD-10-CM

## 2014-10-07 DIAGNOSIS — Z7189 Other specified counseling: Secondary | ICD-10-CM | POA: Insufficient documentation

## 2014-10-07 DIAGNOSIS — D469 Myelodysplastic syndrome, unspecified: Secondary | ICD-10-CM

## 2014-10-07 DIAGNOSIS — I441 Atrioventricular block, second degree: Secondary | ICD-10-CM

## 2014-10-07 DIAGNOSIS — L8990 Pressure ulcer of unspecified site, unspecified stage: Secondary | ICD-10-CM | POA: Insufficient documentation

## 2014-10-07 DIAGNOSIS — Z515 Encounter for palliative care: Secondary | ICD-10-CM | POA: Insufficient documentation

## 2014-10-07 DIAGNOSIS — N189 Chronic kidney disease, unspecified: Secondary | ICD-10-CM

## 2014-10-07 DIAGNOSIS — Z789 Other specified health status: Secondary | ICD-10-CM

## 2014-10-07 LAB — GLUCOSE, CAPILLARY
GLUCOSE-CAPILLARY: 88 mg/dL (ref 65–99)
GLUCOSE-CAPILLARY: 72 mg/dL (ref 65–99)
Glucose-Capillary: 73 mg/dL (ref 65–99)
Glucose-Capillary: 75 mg/dL (ref 65–99)
Glucose-Capillary: 77 mg/dL (ref 65–99)
Glucose-Capillary: 80 mg/dL (ref 65–99)

## 2014-10-07 LAB — CBC
HEMATOCRIT: 29.4 % — AB (ref 39.0–52.0)
HEMOGLOBIN: 8.5 g/dL — AB (ref 13.0–17.0)
MCH: 26.2 pg (ref 26.0–34.0)
MCHC: 28.9 g/dL — AB (ref 30.0–36.0)
MCV: 90.7 fL (ref 78.0–100.0)
Platelets: 339 10*3/uL (ref 150–400)
RBC: 3.24 MIL/uL — AB (ref 4.22–5.81)
RDW: 26.6 % — ABNORMAL HIGH (ref 11.5–15.5)
WBC: 8.3 10*3/uL (ref 4.0–10.5)

## 2014-10-07 LAB — BASIC METABOLIC PANEL
Anion gap: 9 (ref 5–15)
BUN: 64 mg/dL — AB (ref 6–20)
CHLORIDE: 117 mmol/L — AB (ref 101–111)
CO2: 21 mmol/L — AB (ref 22–32)
CREATININE: 2.65 mg/dL — AB (ref 0.61–1.24)
Calcium: 8.6 mg/dL — ABNORMAL LOW (ref 8.9–10.3)
GFR calc Af Amer: 25 mL/min — ABNORMAL LOW (ref >60)
GFR calc non Af Amer: 21 mL/min — ABNORMAL LOW (ref >60)
GLUCOSE: 83 mg/dL (ref 65–99)
POTASSIUM: 4 mmol/L (ref 3.5–5.1)
SODIUM: 147 mmol/L — AB (ref 135–145)

## 2014-10-07 LAB — PROCALCITONIN: PROCALCITONIN: 0.28 ng/mL

## 2014-10-07 LAB — MRSA PCR SCREENING: MRSA by PCR: NEGATIVE

## 2014-10-07 MED ORDER — DARBEPOETIN ALFA 150 MCG/0.3ML IJ SOSY
150.0000 ug | PREFILLED_SYRINGE | INTRAMUSCULAR | Status: DC
Start: 2014-10-08 — End: 2014-10-07

## 2014-10-07 MED ORDER — PIPERACILLIN-TAZOBACTAM 3.375 G IVPB
3.3750 g | Freq: Three times a day (TID) | INTRAVENOUS | Status: DC
  Administered 2014-10-07 – 2014-10-11 (×13): 3.375 g via INTRAVENOUS
  Filled 2014-10-07 (×16): qty 50

## 2014-10-07 MED ORDER — ISOSORBIDE MONONITRATE ER 30 MG PO TB24
30.0000 mg | ORAL_TABLET | Freq: Every day | ORAL | Status: DC
  Administered 2014-10-09 – 2014-10-11 (×3): 30 mg via ORAL
  Filled 2014-10-07 (×5): qty 1

## 2014-10-07 MED ORDER — ASPIRIN EC 81 MG PO TBEC
81.0000 mg | DELAYED_RELEASE_TABLET | Freq: Every day | ORAL | Status: DC
  Administered 2014-10-07 – 2014-10-11 (×4): 81 mg via ORAL
  Filled 2014-10-07 (×5): qty 1

## 2014-10-07 MED ORDER — ACETAMINOPHEN 650 MG RE SUPP: 650.0000 mg | Freq: Four times a day (QID) | RECTAL | Status: DC | PRN

## 2014-10-07 MED ORDER — ALBUTEROL SULFATE (2.5 MG/3ML) 0.083% IN NEBU: 2.5000 mg | INHALATION_SOLUTION | RESPIRATORY_TRACT | Status: DC | PRN

## 2014-10-07 MED ORDER — ATORVASTATIN CALCIUM 40 MG PO TABS
40.0000 mg | ORAL_TABLET | Freq: Every day | ORAL | Status: DC
  Administered 2014-10-07 – 2014-10-11 (×4): 40 mg via ORAL
  Filled 2014-10-07 (×5): qty 1

## 2014-10-07 MED ORDER — FUROSEMIDE 10 MG/ML IJ SOLN
80.0000 mg | Freq: Two times a day (BID) | INTRAMUSCULAR | Status: AC
  Administered 2014-10-07 (×2): 80 mg via INTRAVENOUS
  Filled 2014-10-07 (×3): qty 8

## 2014-10-07 MED ORDER — LEVOTHYROXINE SODIUM 25 MCG PO TABS
125.0000 ug | ORAL_TABLET | Freq: Every day | ORAL | Status: DC
  Administered 2014-10-07 – 2014-10-11 (×4): 125 ug via ORAL
  Filled 2014-10-07 (×13): qty 1

## 2014-10-07 MED ORDER — CETYLPYRIDINIUM CHLORIDE 0.05 % MT LIQD
7.0000 mL | Freq: Two times a day (BID) | OROMUCOSAL | Status: DC
  Administered 2014-10-07 – 2014-10-10 (×6): 7 mL via OROMUCOSAL

## 2014-10-07 MED ORDER — ONDANSETRON HCL 4 MG/2ML IJ SOLN: 4.0000 mg | Freq: Four times a day (QID) | INTRAMUSCULAR | Status: DC | PRN

## 2014-10-07 MED ORDER — ALLOPURINOL 100 MG PO TABS
100.0000 mg | ORAL_TABLET | Freq: Every day | ORAL | Status: DC
  Administered 2014-10-07 (×2): 100 mg via ORAL
  Filled 2014-10-07 (×3): qty 1

## 2014-10-07 MED ORDER — ACETAMINOPHEN 325 MG PO TABS
650.0000 mg | ORAL_TABLET | Freq: Four times a day (QID) | ORAL | Status: DC | PRN
  Administered 2014-10-07 – 2014-10-11 (×7): 650 mg via ORAL
  Filled 2014-10-07 (×7): qty 2

## 2014-10-07 MED ORDER — MUPIROCIN CALCIUM 2 % EX CREA
TOPICAL_CREAM | Freq: Every day | CUTANEOUS | Status: DC
  Administered 2014-10-07 – 2014-10-11 (×4): via TOPICAL
  Filled 2014-10-07 (×3): qty 15

## 2014-10-07 MED ORDER — INSULIN ASPART 100 UNIT/ML ~~LOC~~ SOLN: 0.0000 [IU] | Freq: Three times a day (TID) | SUBCUTANEOUS | Status: DC

## 2014-10-07 MED ORDER — PIPERACILLIN-TAZOBACTAM IN DEX 2-0.25 GM/50ML IV SOLN: 2.2500 g | Freq: Four times a day (QID) | INTRAVENOUS | Status: DC

## 2014-10-07 MED ORDER — DARBEPOETIN ALFA 40 MCG/0.4ML IJ SOSY
40.0000 ug | PREFILLED_SYRINGE | INTRAMUSCULAR | Status: DC
  Filled 2014-10-07: qty 0.4

## 2014-10-07 MED ORDER — INSULIN DETEMIR 100 UNIT/ML ~~LOC~~ SOLN
20.0000 [IU] | Freq: Every day | SUBCUTANEOUS | Status: DC
  Filled 2014-10-07: qty 0.2

## 2014-10-07 MED ORDER — HYDRALAZINE HCL 50 MG PO TABS
100.0000 mg | ORAL_TABLET | Freq: Three times a day (TID) | ORAL | Status: DC
  Administered 2014-10-07 (×2): 100 mg via ORAL
  Filled 2014-10-07 (×6): qty 2

## 2014-10-07 MED ORDER — AMLODIPINE BESYLATE 10 MG PO TABS
10.0000 mg | ORAL_TABLET | Freq: Every day | ORAL | Status: DC
  Administered 2014-10-07: 10 mg via ORAL
  Filled 2014-10-07 (×2): qty 1

## 2014-10-07 MED ORDER — MOMETASONE FURO-FORMOTEROL FUM 100-5 MCG/ACT IN AERO
2.0000 | INHALATION_SPRAY | Freq: Two times a day (BID) | RESPIRATORY_TRACT | Status: DC
  Administered 2014-10-07 – 2014-10-10 (×6): 2 via RESPIRATORY_TRACT
  Filled 2014-10-07 (×4): qty 8.8

## 2014-10-07 MED ORDER — ALPRAZOLAM 0.25 MG PO TABS
0.2500 mg | ORAL_TABLET | Freq: Three times a day (TID) | ORAL | Status: DC | PRN
  Administered 2014-10-07 – 2014-10-11 (×5): 0.25 mg via ORAL
  Filled 2014-10-07 (×5): qty 1

## 2014-10-07 MED ORDER — DARBEPOETIN ALFA 300 MCG/0.6ML IJ SOSY
300.0000 ug | PREFILLED_SYRINGE | Freq: Once | INTRAMUSCULAR | Status: AC
  Administered 2014-10-07: 300 ug via SUBCUTANEOUS
  Filled 2014-10-07 (×2): qty 0.6

## 2014-10-07 MED ORDER — AMIODARONE HCL 100 MG PO TABS
100.0000 mg | ORAL_TABLET | ORAL | Status: DC
  Administered 2014-10-07: 100 mg via ORAL
  Filled 2014-10-07 (×3): qty 1

## 2014-10-07 MED ORDER — HEPARIN SODIUM (PORCINE) 5000 UNIT/ML IJ SOLN
5000.0000 [IU] | Freq: Three times a day (TID) | INTRAMUSCULAR | Status: DC
  Administered 2014-10-07 – 2014-10-11 (×14): 5000 [IU] via SUBCUTANEOUS
  Filled 2014-10-07 (×16): qty 1

## 2014-10-07 MED ORDER — DARBEPOETIN ALFA 300 MCG/0.6ML IJ SOSY: 300.0000 ug | PREFILLED_SYRINGE | INTRAMUSCULAR | Status: DC

## 2014-10-07 MED ORDER — PANTOPRAZOLE SODIUM 40 MG PO TBEC
40.0000 mg | DELAYED_RELEASE_TABLET | Freq: Every day | ORAL | Status: DC
  Administered 2014-10-07: 40 mg via ORAL

## 2014-10-07 MED ORDER — ONDANSETRON HCL 4 MG PO TABS: 4.0000 mg | ORAL_TABLET | Freq: Four times a day (QID) | ORAL | Status: DC | PRN

## 2014-10-07 NOTE — Progress Notes (Signed)
Aranesp per Pharmacy Consult:  76 YOF with CKD IV and MDS was supposed to get her monthly Aranesp 300 mcg SubQ dose at [facility] but was transferred to [facility]. Will schedule a one-time Aranesp 300 mcg dose.   Albertina Parr, PharmD., BCPS Clinical Pharmacist Pager [phone]

## 2014-10-07 NOTE — Consult Note (Signed)
Cardiologist:    Reason for Consult: Bradycardia Referring Physician: [name] is an 81 y.o. male.  HPI:   [name] is a very pleasant 81 year old male with a history of diastolic heart failure as well as 2nd degree heart block (Wenckebach), hypertension, hyperlipidemia, diabetes, previous CVA, and paroxysmal atrial fibrillation on amiodarone.  Not on coumadin as felt to be high risk of bleeding due to h/o ETOH. Echo 3/11 EF 60-65% mild MR. Also has history of prostate cancer with prostatectomy 15 year ago and now with lesion on spine not thought to be from cancer.   Admitted to [facility] with volume overload 9/2 through 10/26/13 in the setting of recent transfusions. Diuresed with IV lasix. Discharge weigh was 238 pounds.   Admitted to [facility] 10/10 through 11/30/13 with increased dyspnea. Treated for HCAP and mild volume overload. Diuresed with IV lasix and given antibiotics. Discharge weight was 232 pounds.  He was last seen in the office on 04/2014 at CHF clinic.  He was just discharge on 09/23/14 after being treated for acute diastolic CHF, AonCKD, SP right AKA.    He presented with SOB and diagnosed with HCAP/aspiration PNA.  He is cooperative but I am not able to understand him becuase he mumbles.   Past Medical History  Diagnosis Date  . Chronic diastolic heart failure     secondary to diastolic dysfunction EF (previous EF 35-45%)ef 60%4/09  . Arrhythmia     atrial fibrillation /pt on amiodarone,thought to be poor coumadin  . Stroke   . Other and unspecified hyperlipidemia   . Hypertension   . Hypertrophy of prostate without urinary obstruction and other lower urinary tract symptoms (LUTS)   . Osteoarthrosis, unspecified whether generalized or localized, unspecified site   . Type II or unspecified type diabetes mellitus without mention of complication, not stated as uncontrolled   . AV block, Mobitz 1     Intolerance to ACE's / discontiuation of beta blockers  .  Obstructive sleep apnea   . Iron deficiency anemia, unspecified     s/p EGD and coloscopy 3/10.gastritis.hemorrhids  . Cerebrovascular accident   . Renal insufficiency   . Obesity   . Allergic rhinitis, cause unspecified 05/29/2010  . CAD (coronary artery disease)     cath 12/04: mLAD 50-60%, mCFX 20-30%, pRCA 50-60%, mRCA 40%  . Normochromic normocytic anemia 04/21/2013  . Abnormal CXR 04/21/2013  . Hypertensive kidney disease with CKD stage III 04/21/2013  . Prostate cancer   . CHF (congestive heart failure)   . COPD (chronic obstructive pulmonary disease)   . Shortness of breath dyspnea   . Pneumonia     Past Surgical History  Procedure Laterality Date  . Prostate surgery    . Prostate surgury      hx of  . Esophagogastroduodenoscopy N/A 04/21/2013    Procedure: ESOPHAGOGASTRODUODENOSCOPY (EGD);  Surgeon: Irene Shipper, MD;  Location: [facility];  Service: Endoscopy;  Laterality: N/A;  . Amputation Right 09/13/2014    Procedure: AMPUTATION ABOVE KNEE;  Surgeon: Newt Minion, MD;  Location: [place];  Service: Orthopedics;  Laterality: Right;    Family History  Problem Relation Age of Onset  . Heart disease Father   . Dementia Mother   . CAD Sister     Social History:  reports that he has quit smoking. He has never used smokeless tobacco. He reports that he does not drink alcohol or use illicit drugs.  Allergies:  Allergies  Allergen  Reactions  . Ace Inhibitors Other (See Comments)    HYPOTENSION  . Beta Adrenergic Blockers Other (See Comments)     use cautiously secondary to 2nd degree heart block, hypotension    Medications:  Scheduled Meds: . allopurinol  100 mg Oral QHS  . amiodarone  100 mg Oral BH-q7a  . amLODipine  10 mg Oral Daily  . antiseptic oral rinse  7 mL Mouth Rinse BID  . aspirin EC  81 mg Oral Daily  . atorvastatin  40 mg Oral Daily  . furosemide  80 mg Intravenous Q12H  . heparin  5,000 Units Subcutaneous 3 times per day  . hydrALAZINE  100 mg Oral  TID  . insulin aspart  0-9 Units Subcutaneous TID WC  . isosorbide mononitrate  30 mg Oral Daily  . levothyroxine  125 mcg Oral QAC breakfast  . mometasone-formoterol  2 puff Inhalation BID  . mupirocin cream   Topical Daily  . pantoprazole  40 mg Oral Daily  . piperacillin-tazobactam (ZOSYN)  IV  3.375 g Intravenous Q8H  . [START ON 10/08/2014] vancomycin  1,500 mg Intravenous Q48H   Continuous Infusions:  PRN Meds:.acetaminophen **OR** acetaminophen, albuterol, ALPRAZolam, ondansetron **OR** ondansetron (ZOFRAN) IV   Results for orders placed or performed during the hospital encounter of 10/06/14 (from the past 48 hour(s))  CBC with Differential     Status: Abnormal   Collection Time: 10/06/14  3:20 PM  Result Value Ref Range   WBC 9.3 4.0 - 10.5 K/uL   RBC 3.28 (L) 4.22 - 5.81 MIL/uL   Hemoglobin 8.6 (L) 13.0 - 17.0 g/dL   HCT 29.7 (L) 39.0 - 52.0 %   MCV 90.5 78.0 - 100.0 fL   MCH 26.2 26.0 - 34.0 pg   MCHC 29.0 (L) 30.0 - 36.0 g/dL   RDW 26.4 (H) 11.5 - 15.5 %   Platelets 363 150 - 400 K/uL   Neutrophils Relative % 86 (H) 43 - 77 %   Lymphocytes Relative 8 (L) 12 - 46 %   Monocytes Relative 4 3 - 12 %   Eosinophils Relative 2 0 - 5 %   Basophils Relative 0 0 - 1 %   Neutro Abs 8.0 (H) 1.7 - 7.7 K/uL   Lymphs Abs 0.7 0.7 - 4.0 K/uL   Monocytes Absolute 0.4 0.1 - 1.0 K/uL   Eosinophils Absolute 0.2 0.0 - 0.7 K/uL   Basophils Absolute 0.0 0.0 - 0.1 K/uL   RBC Morphology POLYCHROMASIA PRESENT     Comment: FEW TARGET CELLS AND ELLIPTOCYTES NOTED  Basic metabolic panel     Status: Abnormal   Collection Time: 10/06/14  3:20 PM  Result Value Ref Range   Sodium 144 135 - 145 mmol/L   Potassium 4.6 3.5 - 5.1 mmol/L   Chloride 115 (H) 101 - 111 mmol/L   CO2 19 (L) 22 - 32 mmol/L   Glucose, Bld 108 (H) 65 - 99 mg/dL   BUN 71 (H) 6 - 20 mg/dL   Creatinine, Ser 2.81 (H) 0.61 - 1.24 mg/dL   Calcium 8.6 (L) 8.9 - 10.3 mg/dL   GFR calc non Af Amer 20 (L) >60 mL/min   GFR calc Af  Amer 23 (L) >60 mL/min    Comment: (NOTE) The eGFR has been calculated using the CKD EPI equation. This calculation has not been validated in all clinical situations. eGFR's persistently <60 mL/min signify possible Chronic Kidney Disease.    Anion gap 10 5 - 15  I-Stat CG4 Lactic Acid, ED     Status: None   Collection Time: 10/06/14  4:03 PM  Result Value Ref Range   Lactic Acid, Venous 1.43 0.5 - 2.0 mmol/L  I-Stat Arterial Blood Gas, ED - (order at [facility] and [facility] only)     Status: Abnormal   Collection Time: 10/06/14  5:27 PM  Result Value Ref Range   pH, Arterial 7.429 7.350 - 7.450   pCO2 arterial 28.3 (L) 35.0 - 45.0 mmHg   pO2, Arterial 51.0 (L) 80.0 - 100.0 mmHg   Bicarbonate 18.8 (L) 20.0 - 24.0 mEq/L   TCO2 20 0 - 100 mmol/L   O2 Saturation 87.0 %   Acid-base deficit 5.0 (H) 0.0 - 2.0 mmol/L   Patient temperature 98.6 F    Collection site RADIAL, ALLEN'S TEST ACCEPTABLE    Drawn by Operator    Sample type ARTERIAL   POC CBG, ED     Status: Abnormal   Collection Time: 10/06/14  5:36 PM  Result Value Ref Range   Glucose-Capillary 108 (H) 65 - 99 mg/dL  Urinalysis, Routine w reflex microscopic (not at [facility])     Status: Abnormal   Collection Time: 10/06/14  7:43 PM  Result Value Ref Range   Color, Urine YELLOW YELLOW   APPearance CLOUDY (A) CLEAR   Specific Gravity, Urine 1.012 1.005 - 1.030   pH 5.0 5.0 - 8.0   Glucose, UA NEGATIVE NEGATIVE mg/dL   Hgb urine dipstick NEGATIVE NEGATIVE   Bilirubin Urine NEGATIVE NEGATIVE   Ketones, ur NEGATIVE NEGATIVE mg/dL   Protein, ur 30 (A) NEGATIVE mg/dL   Urobilinogen, UA 0.2 0.0 - 1.0 mg/dL   Nitrite NEGATIVE NEGATIVE   Leukocytes, UA MODERATE (A) NEGATIVE  Urine microscopic-add on     Status: Abnormal   Collection Time: 10/06/14  7:43 PM  Result Value Ref Range   Squamous Epithelial / LPF RARE RARE   WBC, UA 21-50 <3 WBC/hpf   Casts HYALINE CASTS (A) NEGATIVE   Urine-Other FEW YEAST   MRSA PCR Screening     Status: None     Collection Time: 10/06/14 11:30 PM  Result Value Ref Range   MRSA by PCR NEGATIVE NEGATIVE    Comment:        The GeneXpert MRSA Assay (FDA approved for NASAL specimens only), is one component of a comprehensive MRSA colonization surveillance program. It is not intended to diagnose MRSA infection nor to guide or monitor treatment for MRSA infections.   Glucose, capillary     Status: None   Collection Time: 10/07/14 12:47 AM  Result Value Ref Range   Glucose-Capillary 75 65 - 99 mg/dL   Comment 1 Notify RN   CBC     Status: Abnormal   Collection Time: 10/07/14  3:08 AM  Result Value Ref Range   WBC 8.3 4.0 - 10.5 K/uL   RBC 3.24 (L) 4.22 - 5.81 MIL/uL   Hemoglobin 8.5 (L) 13.0 - 17.0 g/dL   HCT 29.4 (L) 39.0 - 52.0 %   MCV 90.7 78.0 - 100.0 fL   MCH 26.2 26.0 - 34.0 pg   MCHC 28.9 (L) 30.0 - 36.0 g/dL   RDW 26.6 (H) 11.5 - 15.5 %   Platelets 339 150 - 400 K/uL  Basic metabolic panel     Status: Abnormal   Collection Time: 10/07/14  3:08 AM  Result Value Ref Range   Sodium 147 (H) 135 - 145 mmol/L   Potassium 4.0  3.5 - 5.1 mmol/L   Chloride 117 (H) 101 - 111 mmol/L   CO2 21 (L) 22 - 32 mmol/L   Glucose, Bld 83 65 - 99 mg/dL   BUN 64 (H) 6 - 20 mg/dL   Creatinine, Ser 2.65 (H) 0.61 - 1.24 mg/dL   Calcium 8.6 (L) 8.9 - 10.3 mg/dL   GFR calc non Af Amer 21 (L) >60 mL/min   GFR calc Af Amer 25 (L) >60 mL/min    Comment: (NOTE) The eGFR has been calculated using the CKD EPI equation. This calculation has not been validated in all clinical situations. eGFR's persistently <60 mL/min signify possible Chronic Kidney Disease.    Anion gap 9 5 - 15  Glucose, capillary     Status: None   Collection Time: 10/07/14  7:33 AM  Result Value Ref Range   Glucose-Capillary 77 65 - 99 mg/dL   Comment 1 Notify RN    Comment 2 Document in Chart   Glucose, capillary     Status: None   Collection Time: 10/07/14 10:56 AM  Result Value Ref Range   Glucose-Capillary 80 65 - 99 mg/dL     Dg Chest Portable 1 View  10/06/2014   CLINICAL DATA:  Shortness of breath and wheezing.  Possible fever.  EXAM: PORTABLE CHEST - 1 VIEW  COMPARISON:  Single view of the chest 09/18/2014 and 09/15/2014.  FINDINGS: Extensive bilateral airspace disease seen on the most recent examination persists and appears somewhat worsened in the left mid lung zone. Aeration in the right chest appears mildly improved. There is cardiomegaly. No pneumothorax or pleural effusion is identified.  IMPRESSION: Multifocal airspace disease has an appearance most compatible with pneumonia. There is increased airspace opacity in the left mid lung zone. Aeration in the right chest appears somewhat improved.   Electronically Signed   By: Inge Rise M.D.   On: 10/06/2014 16:22    Review of Systems  Unable to perform ROS: mental acuity  All other systems reviewed and are negative.  Blood pressure 144/65, pulse 80, temperature 98.7 F (37.1 C), temperature source Axillary, resp. rate 14, height 5' 10"  (1.778 m), weight 222 lb 3.6 oz (100.8 kg), SpO2 96 %. Physical Exam  Nursing note and vitals reviewed. Constitutional: He appears well-developed. No distress.  Obese  HENT:  Head: Normocephalic and atraumatic.  Eyes: Conjunctivae are normal. Pupils are equal, round, and reactive to light. No scleral icterus.  Neck: Normal range of motion. Neck supple. No JVD present.  Cardiovascular: Normal rate, regular rhythm, S1 normal and S2 normal.   Murmur heard.  Systolic murmur is present with a grade of 1/6  Pulses:      Radial pulses are 2+ on the right side, and 2+ on the left side.       Dorsalis pedis pulses are 1+ on the left side. Right dorsalis pedis pulse not accessible.  Respiratory: Effort normal. He has wheezes.  GI: Soft. Bowel sounds are normal. He exhibits no distension. There is no tenderness.  Neurological: He is alert.  Cooperative.  The patient mumble and it is impossible to understand him.    Skin:  Skin is warm.  Left leg very dry.     Assessment/Plan: Principal Problem:   HCAP (healthcare-associated pneumonia) Active Problems:   Type 2 diabetes mellitus with renal manifestations   Paroxysmal atrial fibrillation- in AF 03/10/14   Open wound of left lower leg   Acute on chronic respiratory failure with hypoxia  Myelodysplastic syndrome-hx of transfusions   Chronic renal insufficiency, stage III-IV   Respiratory failure, acute and chronic   Pressure ulcer    Second degree AVB, Mobitz I   Two to one AVB The patient is having frequent Mobitz I and had a short period of 2:1 AVB without change in QRS morphology.  We will stop the amiodarone and continue to monitor.  I do not think he is a candidate for a pacemaker given his multiple comorbidities.  It's ok for him to be bradycardiac.  Monitor for symptomatic changes.    Tarri Fuller, [place] 10/07/2014, 11:24 AM   I have seen and examined the patient along with Tarri Fuller, PA.  I have reviewed the chart, notes and new data.  I agree with PA's note.  Key new complaints: groans a lot, but denies discomfort at this time Key examination changes: alert, healing R leg amputation. Ischemic appearing left foot. Key new findings / data: he had periods of 2: AV block with broad QRS complex last night. This suggests both infrahisian and suprahisian sites of AV conduction block.  PLAN: Stop amiodarone. Asymptomatic and sedentary. No immediate indication for pacemaker and I would avoid device implantation in this gentleman with advanced vascular/respiratory/hematological and kidney disease and poor prognosis. Avoid negative chronotropic agents (beta blockers, verapamil/diltiazem, digoxin, etc.)  Sanda Klein, MD, [facility] [phone] 10/07/2014, 2:19 PM

## 2014-10-07 NOTE — Progress Notes (Signed)
PROGRESS NOTE  Travis MURDAUGH Sr. IHW:[id number] DOB: 08/31/1932 DOA: 10/06/2014 PCP: [name], MD  Brief history 81 year old male with a history of CKD stage IV, diastolic CHF, myelodysplastic syndrome, diabetes mellitus, atrial fibrillation, peripheral vascular disease, and coronary artery disease presented with increasing shortness of breath. The patient was recently discharged from the hospital on 09/23/2014 after a prolonged hospital stay during which he had a right above-the-knee amputation secondary to nonhealing leg wounds as well as CHF exacerbation. The patient was discharged with torsemide 80 mg twice a day. During that hospital stay, the patient was seen by palliative medicine. There continues to be incongruent desires within the patient's family regarding his level of care and goals of care. He was also seen by nephrology who did not feel that he was a good dialysis candidate. During his last hospital stay, the patient was also treated for pneumonia with 10 days of vancomycin and Zosyn. The patient was on his way to [place] center, and he was diverted to  [facility] ED due to worsening shortness of breath. Upon presentation to the emergency department, the patient was noted to be on 6 L nasal cannula and chest x-ray showed multifocal opacities bilaterally. Over the past 2 weeks, the patient has required increasing oxygen demand. He was discharged on 2 L.  Assessment/Plan: Acute on chronic respiratory failure -Secondary to pneumonia, suspect aspiration/HCAP -Continue vancomycin and switch fortaz to zosyn pending culture data -Lactic acid 1.43 -Procalcitonin -Presently stable on 3 L nasal cannula with oxygen saturation 94-96 percent HCAP/Aspiration pneumonia -speech therapy eval when pt is more alert -Antibiotics as discussed above -Pulmonary hygiene Chronic diastolic CHF -The patient does not appear to be fluid overloaded clinically -However given the patient's encephalopathy,  continue diuretics and IV form -Patient was discharged with torsemide 80 mg twice a day from his last admission in August 2016--continue furosemide 80 mg IV twice a day -03/11/2014 echocardiogram EF 55-60% -daily weights CKD stage IV -Serum creatinine 3.85 at the time of discharge 09/23/2014 -Monitor renal function on diuretics Paroxysmal atrial fibrillation -This morning, the patient appears to be in Mobitz type II AV block -I have consulted cardiology -Optimize electrolytes -Potassium 4.0 -Continue amiodarone -continue ASA Diabetes mellitus type 2 -Hold Levemir today due to poor oral intake and CBGs in 70-80 range -NovoLog sliding scale -08/04/2014 hemoglobin A1c 8.2 Peripheral vascular disease -Status post right AKA 09/13/2014 --Dr. [name] Given  -I'm waiting for return call regarding possible removal of his staples from his stump  -Right stump looks clean without any signs of infection   Hypertension  -Continue hydralazine and Imdur  -Continue amlodipine  Hypothyroidism  -Continue Synthroid  Dyslipidemia  -Continue statin  Sclerotic lesion on ribs on CXR -patient has sclerotic lesions on the ribs on CXR, -Dr [name] is aware of this finding as per his last note in epic. He will follow the patient as outpatient.  Myelodysplastic syndrome-hx of transfusions/ Anemia of chronic disease -Hb stable -Give the patient's scheduled dose of Aranesp today  Goals of Care -palliative medicine has been consulted  Family Communication:   Pt at beside Disposition Plan:   SNF when medically stable         Procedures/Studies: Dg Chest 1 View  09/07/2014   CLINICAL DATA:  Fluid overload, history CHF, COPD, hypertension, diabetes mellitus, prostate cancer  EXAM: CHEST  1 VIEW  COMPARISON:  Portable exam 1902 hours compared to 09/06/2014  FINDINGS: Enlargement of cardiac silhouette.  Atherosclerotic calcification aorta.  Mediastinal contour stable.  Diffuse pulmonary infiltrates question  edema versus less likely infection, increased versus previous study.  No pleural effusion or pneumothorax.  Sclerotic foci at the proximal LEFT humerus question related to metastatic disease  IMPRESSION: Enlargement of cardiac silhouette with diffuse BILATERAL infiltrates favoring pulmonary edema over infection.  Sclerotic foci at the proximal LEFT humerus cannot exclude sclerotic osseous metastases in this patient with a history of prostate cancer.   Electronically Signed   By: Lavonia Dana M.D.   On: 09/07/2014 20:02   Dg Chest 2 View  09/09/2014   CLINICAL DATA:  81 year old male with fever, cough and congestion  EXAM: CHEST  2 VIEW  COMPARISON:  Prior chest x-ray 09/07/2014  FINDINGS: Overall, there has been some improvement in diffuse bilateral interstitial and airspace opacities. However, there remains some focal airspace opacification within the right mid lung. No pleural effusion. No pneumothorax. Stable cardiomegaly and mediastinal contours. Atherosclerotic calcification remains present within the transverse aorta.  IMPRESSION: 1. Improving pulmonary edema. 2. Residual patchy airspace opacity in the right mid lung in the region of the minor fissure may represent superimposed infiltrate/pneumonia, or perhaps some fluid trapped within the minor fissure. 3. Stable cardiomegaly.   Electronically Signed   By: Jacqulynn Cadet M.D.   On: 09/09/2014 10:21   US Renal  09/17/2014   CLINICAL DATA:  Acute renal insufficiency  EXAM: RENAL / URINARY TRACT ULTRASOUND COMPLETE  COMPARISON:  None.  FINDINGS: Right Kidney:  Length: 10.2 cm. Echogenicity within normal limits. No mass or hydronephrosis visualized.  Left Kidney:  Length: 10.5 cm.  Limited views, negative for hydronephrosis.  Bladder:  Not seen.  IMPRESSION: Negative for hydronephrosis.  Limited views of the left kidney.   Electronically Signed   By: Andreas Newport M.D.   On: 09/17/2014 02:44   Nm Pulmonary Perf And Vent  09/08/2014   CLINICAL DATA:   Shortness of breath.  EXAM: NUCLEAR MEDICINE VENTILATION - PERFUSION LUNG SCAN  TECHNIQUE: Ventilation images were obtained in multiple projections using inhaled aerosol Tc-99m DTPA. Perfusion images were obtained in multiple projections after intravenous injection of Tc-68m MAA.  RADIOPHARMACEUTICALS:  39.2 Technetium-32m DTPA aerosol inhalation and 6.45 Technetium-28m MAA IV  COMPARISON:  Chest x-ray 09/07/2014.  FINDINGS: Ventilation: Severe bilateral ventilatory defects. No segmental sized perfusion defects noted.  Perfusion: No wedge shaped peripheral perfusion defects to suggest acute pulmonary embolism.  IMPRESSION: Severe bilateral ventilatory defects consistent with bilateral airspace disease noted on recent chest x-ray. No perfusion defects noted to suggest pulmonary embolus. Low probability scan for pulmonary embolus.   Electronically Signed   By: Marcello Moores  Register   On: 09/08/2014 12:12   Dg Chest Portable 1 View  10/06/2014   CLINICAL DATA:  Shortness of breath and wheezing.  Possible fever.  EXAM: PORTABLE CHEST - 1 VIEW  COMPARISON:  Single view of the chest 09/18/2014 and 09/15/2014.  FINDINGS: Extensive bilateral airspace disease seen on the most recent examination persists and appears somewhat worsened in the left mid lung zone. Aeration in the right chest appears mildly improved. There is cardiomegaly. No pneumothorax or pleural effusion is identified.  IMPRESSION: Multifocal airspace disease has an appearance most compatible with pneumonia. There is increased airspace opacity in the left mid lung zone. Aeration in the right chest appears somewhat improved.   Electronically Signed   By: Inge Rise M.D.   On: 10/06/2014 16:22   Dg Chest Port 1 View  09/18/2014   CLINICAL  DATA:  Leukocytosis  EXAM: PORTABLE CHEST - 1 VIEW  COMPARISON:  09/15/2014  FINDINGS: Multifocal patchy opacities in the right upper and lower lobes and left prior hilar region, suspicious for multifocal pneumonia,  less likely interstitial edema. No definite pleural effusions. No pneumothorax.  The heart is top-normal in size.  IMPRESSION: Suspected multifocal pneumonia in the bilateral upper lobes and right lower lobe, grossly unchanged.   Electronically Signed   By: Julian Hy M.D.   On: 09/18/2014 08:46   Dg Chest Port 1 View  09/15/2014   CLINICAL DATA:  Fever and difficulty breathing  EXAM: PORTABLE CHEST - 1 VIEW  COMPARISON:  September 09, 2014  FINDINGS: There is generalized interstitial edema. There remains slight alveolar consolidation in the mid right lung, less than on recent prior study. Heart is enlarged. The pulmonary vascular is within normal limits. No adenopathy.  IMPRESSION: Findings consistent with congestive heart failure. A small focus of pneumonia in the right mid lung cannot be excluded.   Electronically Signed   By: Lowella Grip III M.D.   On: 09/15/2014 16:53         Subjective:  patient is awake but pleasantly confused. Only intermittently answers questions. Denies any headache, chest pain, shortness of breath. No reports of respiratory distress, vomiting, diarrhea, uncontrolled pain. Otherwise review of systems unobtainable secondary to patient's mental status   Objective: Filed Vitals:   10/07/14 0036 10/07/14 0416 10/07/14 0726 10/07/14 0930  BP:  160/66 144/65   Pulse:  80 80   Temp:  98.9 F (37.2 C) 98.7 F (37.1 C)   TempSrc:  Oral Axillary   Resp:  15 14   Height: 5\' 10"  (1.778 m)     Weight: 100.8 kg (222 lb 3.6 oz)     SpO2:  94% 95% 96%    Intake/Output Summary (Last 24 hours) at 10/07/14 1123 Last data filed at 10/07/14 0726  Gross per 24 hour  Intake    550 ml  Output   1750 ml  Net  -1200 ml   Weight change:  Exam:   General:  Pt is alert, follows commands appropriately, not in acute distress  HEENT: No icterus, No thrush, No neck mass, [place]/AT; no meningismus   Cardiovascular: RRR, S1/S2, no rubs, no gallops  Respiratory: bibasilar  crackles. No wheezing. Good air movement.   Abdomen: Soft/+BS, non tender, non distended, no guarding  Extremities: No edema, No lymphangitis, No petechiae, No rashes, no synovitis; right above-the-knee amputation site without erythema, drainage   Data Reviewed: Basic Metabolic Panel:  Recent Labs Lab 10/01/14 10/06/14 1520 10/07/14 0308  NA 137 144 147*  K 5.3 4.6 4.0  CL  --  115* 117*  CO2  --  19* 21*  GLUCOSE  --  108* 83  BUN 71* 71* 64*  CREATININE 2.8* 2.81* 2.65*  CALCIUM  --  8.6* 8.6*   Liver Function Tests:  Recent Labs Lab 10/01/14  AST 15  ALT 11  ALKPHOS 107   No results for input(s): LIPASE, AMYLASE in the last 168 hours. No results for input(s): AMMONIA in the last 168 hours. CBC:  Recent Labs Lab 10/01/14 10/06/14 1520 10/07/14 0308  WBC 8.4 9.3 8.3  NEUTROABS  --  8.0*  --   HGB 8.3* 8.6* 8.5*  HCT 28* 29.7* 29.4*  MCV  --  90.5 90.7  PLT 405* 363 339   Cardiac Enzymes: No results for input(s): CKTOTAL, CKMB, CKMBINDEX, TROPONINI in the last  168 hours. BNP: Invalid input(s): POCBNP CBG:  Recent Labs Lab 10/06/14 1736 10/07/14 0047 10/07/14 0733 10/07/14 1056  GLUCAP 108* 75 77 80    Recent Results (from the past 240 hour(s))  MRSA PCR Screening     Status: None   Collection Time: 10/06/14 11:30 PM  Result Value Ref Range Status   MRSA by PCR NEGATIVE NEGATIVE Final    Comment:        The GeneXpert MRSA Assay (FDA approved for NASAL specimens only), is one component of a comprehensive MRSA colonization surveillance program. It is not intended to diagnose MRSA infection nor to guide or monitor treatment for MRSA infections.      Scheduled Meds: . allopurinol  100 mg Oral QHS  . amiodarone  100 mg Oral BH-q7a  . amLODipine  10 mg Oral Daily  . antiseptic oral rinse  7 mL Mouth Rinse BID  . aspirin EC  81 mg Oral Daily  . atorvastatin  40 mg Oral Daily  . cefTAZidime (FORTAZ)  IV  2 g Intravenous Q24H  . furosemide   80 mg Intravenous Q12H  . heparin  5,000 Units Subcutaneous 3 times per day  . hydrALAZINE  100 mg Oral TID  . insulin aspart  0-9 Units Subcutaneous TID WC  . insulin detemir  20 Units Subcutaneous Daily  . levothyroxine  125 mcg Oral QAC breakfast  . mometasone-formoterol  2 puff Inhalation BID  . mupirocin cream   Topical Daily  . pantoprazole  40 mg Oral Daily  . [START ON 10/08/2014] vancomycin  1,500 mg Intravenous Q48H   Continuous Infusions:    Nastassia Bazaldua, DO  [facility] Pager [phone]  If 7PM-7AM, please contact night-coverage [email or web address] Password [email or web address] 10/07/2014, 11:23 AM   LOS: 1 day

## 2014-10-07 NOTE — Evaluation (Signed)
Clinical/Bedside Swallow Evaluation Patient Details  Name: Travis FARNEY Sr. MRN: [id number] Date of Birth: Jul 30, 1932  Today's Date: 10/07/2014 Time: SLP Start Time (ACUTE ONLY): 4627 SLP Stop Time (ACUTE ONLY): 0921 SLP Time Calculation (min) (ACUTE ONLY): 18 min  Past Medical History:  Past Medical History  Diagnosis Date  . Chronic diastolic heart failure     secondary to diastolic dysfunction EF (previous EF 35-45%)ef 60%4/09  . Arrhythmia     atrial fibrillation /pt on amiodarone,thought to be poor coumadin  . Stroke   . Other and unspecified hyperlipidemia   . Hypertension   . Hypertrophy of prostate without urinary obstruction and other lower urinary tract symptoms (LUTS)   . Osteoarthrosis, unspecified whether generalized or localized, unspecified site   . Type II or unspecified type diabetes mellitus without mention of complication, not stated as uncontrolled   . AV block, Mobitz 1     Intolerance to ACE's / discontiuation of beta blockers  . Obstructive sleep apnea   . Iron deficiency anemia, unspecified     s/p EGD and coloscopy 3/10.gastritis.hemorrhids  . Cerebrovascular accident   . Renal insufficiency   . Obesity   . Allergic rhinitis, cause unspecified 05/29/2010  . CAD (coronary artery disease)     cath 12/04: mLAD 50-60%, mCFX 20-30%, pRCA 50-60%, mRCA 40%  . Normochromic normocytic anemia 04/21/2013  . Abnormal CXR 04/21/2013  . Hypertensive kidney disease with CKD stage III 04/21/2013  . Prostate cancer   . CHF (congestive heart failure)   . COPD (chronic obstructive pulmonary disease)   . Shortness of breath dyspnea   . Pneumonia    Past Surgical History:  Past Surgical History  Procedure Laterality Date  . Prostate surgery    . Prostate surgury      hx of  . Esophagogastroduodenoscopy N/A 04/21/2013    Procedure: ESOPHAGOGASTRODUODENOSCOPY (EGD);  Surgeon: Irene Shipper, MD;  Location: [facility];  Service: Endoscopy;  Laterality: N/A;  . Amputation  Right 09/13/2014    Procedure: AMPUTATION ABOVE KNEE;  Surgeon: Newt Minion, MD;  Location: [facility];  Service: Orthopedics;  Laterality: Right;   HPI:  81 y.o. male multiple medical problems most significant for advancing CKD IV diastolic CHF, myelodysplastic syndrome, type 2 diabetes, hypothyroidism, atrial fibrillation, history of CVA, COPD,  CAD, severe peripheral vascular disease, was just discharged from [facility] weeks ago, he underwent right above-knee amputation for necrotic right foot and leg he also has ischemic changes in his left leg with wounds, he was seen by palliative care during that hospitalization and initially there was discussion about residential hospice he subsequently went to skilled nursing facility on 8/4. Admitted due to SOB, confusion. Dx with PNA (bilateral, multifocal).    Assessment / Plan / Recommendation Clinical Impression  Swallow evaluation complete. Po trials limited by lethargy and confusion to ice chips and pureed solids. Moderate-max verbal cueing required for sustained attention to feeding tasks and awareness of bolus including labial seal around spoon. Oral phase delayed however once swallow triggered, patient appearing to protect airway. At this time. poorly alert state appears to be largest contributing factor to dysphagia. Recommend NPO except meds crushed in puree when alert only. SLP will f/u at bedside for diagnostic po trials.     Aspiration Risk  Moderate    Diet Recommendation NPO except meds   Medication Administration: Crushed with puree    Other  Recommendations Oral Care Recommendations: Oral care BID   Follow Up  Recommendations       Frequency and Duration min 2x/week  1 week   Pertinent Vitals/Pain n/a        Swallow Study    General Other Pertinent Information: 81 y.o. male multiple medical problems most significant for advancing CKD IV diastolic CHF, myelodysplastic syndrome, type 2 diabetes, hypothyroidism, atrial  fibrillation, history of CVA, COPD,  CAD, severe peripheral vascular disease, was just discharged from [facility] weeks ago, he underwent right above-knee amputation for necrotic right foot and leg he also has ischemic changes in his left leg with wounds, he was seen by palliative care during that hospitalization and initially there was discussion about residential hospice he subsequently went to skilled nursing facility on 8/4. Admitted due to SOB, confusion. Dx with PNA (bilateral, multifocal).  Type of Study: Bedside swallow evaluation Previous Swallow Assessment: none Diet Prior to this Study: NPO Temperature Spikes Noted: No Respiratory Status:  (nasal cannula 3L) History of Recent Intubation: No Behavior/Cognition: Cooperative;Pleasant mood;Lethargic/Drowsy;Requires cueing Oral Cavity - Dentition: Edentulous Self-Feeding Abilities: Total assist Patient Positioning: Upright in bed Baseline Vocal Quality: Low vocal intensity Volitional Cough: Cognitively unable to elicit Volitional Swallow: Unable to elicit    Oral/Motor/Sensory Function Overall Oral Motor/Sensory Function: Appears within functional limits for tasks assessed   Ice Chips Ice chips: Impaired Presentation: Spoon Oral Phase Impairments: Reduced labial seal;Poor awareness of bolus (required verbal and tactile cues for labial seal around spoo)   Thin Liquid Thin Liquid: Not tested    Nectar Thick Nectar Thick Liquid: Not tested   Honey Thick Honey Thick Liquid: Not tested   Puree Puree: Impaired Presentation: Spoon Oral Phase Impairments: Poor awareness of bolus Oral Phase Functional Implications: Prolonged oral transit   Solid   GO   Travis Moster MA, CCC-SLP [phone]  Solid: Not tested       [name] Meryl 10/07/2014,9:25 AM
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
## 2014-10-07 NOTE — Consult Note (Addendum)
WOC wound consult note Reason for Consult: Consult requested for BLE.   Right stump amputation site with staples in place, well approximated without odor or drainage.  Wound type: Left heel with unstageable pressure injury; 3X3cm, 100% dry intact eschar without odor or drainage. Left anterior foot with darker colored deep tissue injury, approx 1X1cm Left leg with 3 full thickness stasis ulcers to anterior lower calf; .3X.3X.2cm, .8X.8X.2cm, .2X.2X.2cm.  All sites with yellow dry woundbed, scant amt yellow drainage, no odor. Pressure Ulcer POA: Yes Dressing procedure/placement/frequency: Dry gauze and tape to right stump to protect right stump from further injury. Foam dressing to left heel and float to reduce pressure; it is best practice to leave dry stable eschar intact.  Bactroban and foam dressing to stasis ulcers to promote moist healing. Discussed plan of care with patient but he does not appear to understand. Pt was followed by Dr [name] Given of the ortho team prior to admission and can resume follow-up after discharge. Please re-consult if further assistance is needed.  Thank-you,  Julien Girt MSN, [place], [place], [place], [place]

## 2014-10-08 DIAGNOSIS — E1122 Type 2 diabetes mellitus with diabetic chronic kidney disease: Secondary | ICD-10-CM

## 2014-10-08 DIAGNOSIS — I480 Paroxysmal atrial fibrillation: Secondary | ICD-10-CM

## 2014-10-08 LAB — CBC
HCT: 30.5 % — ABNORMAL LOW (ref 39.0–52.0)
HEMOGLOBIN: 8.6 g/dL — AB (ref 13.0–17.0)
MCH: 26 pg (ref 26.0–34.0)
MCHC: 28.2 g/dL — ABNORMAL LOW (ref 30.0–36.0)
MCV: 92.1 fL (ref 78.0–100.0)
Platelets: 327 10*3/uL (ref 150–400)
RBC: 3.31 MIL/uL — AB (ref 4.22–5.81)
RDW: 27 % — ABNORMAL HIGH (ref 11.5–15.5)
WBC: 7 10*3/uL (ref 4.0–10.5)

## 2014-10-08 LAB — GLUCOSE, CAPILLARY
GLUCOSE-CAPILLARY: 81 mg/dL (ref 65–99)
Glucose-Capillary: 88 mg/dL (ref 65–99)
Glucose-Capillary: 110 mg/dL — ABNORMAL HIGH (ref 65–99)

## 2014-10-08 LAB — BASIC METABOLIC PANEL
ANION GAP: 10 (ref 5–15)
BUN: 48 mg/dL — AB (ref 6–20)
CHLORIDE: 116 mmol/L — AB (ref 101–111)
CO2: 23 mmol/L (ref 22–32)
Calcium: 8.3 mg/dL — ABNORMAL LOW (ref 8.9–10.3)
Creatinine, Ser: 2.62 mg/dL — ABNORMAL HIGH (ref 0.61–1.24)
GFR calc Af Amer: 25 mL/min — ABNORMAL LOW (ref >60)
GFR calc non Af Amer: 22 mL/min — ABNORMAL LOW (ref >60)
Glucose, Bld: 79 mg/dL (ref 65–99)
POTASSIUM: 3.9 mmol/L (ref 3.5–5.1)
SODIUM: 149 mmol/L — AB (ref 135–145)

## 2014-10-08 MED ORDER — DEXTROSE 5 % IV SOLN
INTRAVENOUS | Status: DC
  Administered 2014-10-08 – 2014-10-10 (×3): via INTRAVENOUS

## 2014-10-08 NOTE — Progress Notes (Signed)
CSW received referral for residential hospice- CSW spoke with pt daughter who is agreeable to referrals at [facility] and [facility] made referrals- bed offer pending.  CSW will continue to follow.  Domenica Reamer, [place] Social Worker [phone]

## 2014-10-08 NOTE — Progress Notes (Signed)
Speech Language Pathology Treatment: Dysphagia  Patient Details Name: Travis STROTHERS Sr. MRN: [id number] DOB: 1932/08/16 Today's Date: 10/08/2014 Time: 1005-1020 SLP Time Calculation (min) (ACUTE ONLY): 15 min  Assessment / Plan / Recommendation Clinical Impression  F/u after yesterday's swallow assessment.  Pt is more alert today, inquiring about others, answering questions, joking with staff.  He was sufficiently alert to consume thin liquids, purees with adequate attention to material and no s/s of aspiration.  He did fatigue quickly, so POs were removed.  Pt expressed great satisfaction with being able to eat and drink.  Recommend resuming a PO diet - dysphagia 2, thin liquids, with assistance for self-feeding and focus on eating for pleasure.  Spoke with daughter, [name], via phone re: recommendations, fluctuating swallowing ability.  She verbalized understanding.  Pt is awaiting bed at residential hospice.  No further SLP f/u is needed.     HPI Other Pertinent Information: 81 y.o. male multiple medical problems most significant for advancing CKD IV diastolic CHF, myelodysplastic syndrome, type 2 diabetes, hypothyroidism, atrial fibrillation, history of CVA, COPD,  CAD, severe peripheral vascular disease, was just discharged from [facility] 2 weeks ago, he underwent right above-knee amputation for necrotic right foot and leg he also has ischemic changes in his left leg with wounds, he was seen by palliative care during that hospitalization and initially there was discussion about residential hospice he subsequently went to skilled nursing facility on 8/4. Admitted due to SOB, confusion. Dx with PNA (bilateral, multifocal).    Pertinent Vitals Pain Assessment: No/denies pain  SLP Plan  All goals met    Recommendations Diet recommendations: Dysphagia 2 (fine chop);Thin liquid Liquids provided via: Cup Medication Administration: Crushed with puree Supervision: Staff to assist with self  feeding Compensations: Slow rate;Small sips/bites Postural Changes and/or Swallow Maneuvers: Seated upright 90 degrees              Oral Care Recommendations: Oral care BID Plan: All goals met   Kritika Stukes L. Tivis Ringer, [place] CCC/SLP Pager [phone]      [name] 10/08/2014, 11:20 AM

## 2014-10-08 NOTE — Progress Notes (Signed)
PROGRESS NOTE  [name]:[id number] DOB: [birth year] DOA: 10/06/2014 PCP: [name], MD  Brief history 81 year old male with a history of CKD stage IV, diastolic CHF, myelodysplastic syndrome, diabetes mellitus, atrial fibrillation, peripheral vascular disease, and coronary artery disease presented with increasing shortness of breath. The patient was recently discharged from the hospital on 09/23/2014 after a prolonged hospital stay during which he had a right above-the-knee amputation secondary to nonhealing leg wounds as well as CHF exacerbation. The patient was discharged with torsemide 80 mg twice a day. During that hospital stay, the patient was seen by palliative medicine. There continues to be incongruent desires within the patient's family regarding his level of care and goals of care. He was also seen by nephrology who did not feel that he was a good dialysis candidate. During his last hospital stay, the patient was also treated for pneumonia with 10 days of vancomycin and Zosyn. The patient was on his way to [place] [facility], and he was diverted to [facility] ED due to worsening shortness of breath. Upon presentation to the emergency department, the patient was noted to be on 6 L nasal cannula and chest x-ray showed multifocal opacities bilaterally. Over the past 2 weeks, the patient has required increasing oxygen demand. He was discharged on 2 L.  Assessment/Plan: Acute on chronic respiratory failure -Secondary to pneumonia, suspect aspiration/HCAP -Continue vancomycin and switch fortaz to zosyn pending culture data -Blood cultures negative to date -Lactic acid 1.43 -Procalcitonin 0.28 -Presently stable on 3 L nasal cannula with oxygen saturation 95-95% percent HCAP/Aspiration pneumonia -speech therapy eval--dysphagia 2 with thin liquids -Antibiotics as discussed above -Pulmonary hygiene Chronic diastolic CHF -The patient does not appear to be fluid overloaded  clinically -Patient was discharged with torsemide 80 mg twice a day from his last admission in August 2016--continue furosemide 80 mg IV twice a day -03/11/2014 echocardiogram EF 55-60% -daily weights CKD stage IV -Serum creatinine 3.85 at the time of discharge 09/23/2014 -Monitor renal function on diuretics Paroxysmal atrial fibrillation with type 2 mobitiz AV block -This morning, the patient appears to be in Mobitz type II AV block -Appreciate cardiology -Not a good candidate for PPM -Optimize electrolytes -Potassium 4.0 -d/c amiodarone per cardiology, avoid AV nodal blocking agents -continue ASA Diabetes mellitus type 2 -Hold Levemir today due to poor oral intake and CBGs in 70-80 range -08/04/2014 hemoglobin A1c 8.2 -Advised the intention is to transition the patient to more of a focus on comfort, we'll discontinue CBGs and sliding scale insulin. Peripheral vascular disease -Status post right AKA 09/13/2014 --Dr. [name] Given  -discussed with Dr. Harrie Foreman to remove staples from stump -Right stump looks clean without any signs of infection  Hypertension  -D/C amlodipine, hydralazine and Imdur as BP remains on soft side Hypothyroidism  -Continue Synthroid  Dyslipidemia  -Continue statin  Sclerotic lesion on ribs on CXR -patient has sclerotic lesions on the ribs on CXR, -Dr [name] is aware of this finding as per his last note in epic. He will follow the patient as outpatient.  Myelodysplastic syndrome-hx of transfusions/ Anemia of chronic disease -Hb stable -Give the patient's scheduled dose of Aranesp 10/07/14  Goals of Care -palliative medicine consult appreciated -Patient's daughters in agreement that they would like to pursue placement at [facility] as they feel this would be in line with their father's wishes. -continue current care until transfer to residential hospice  Family Communication: Pt at beside; left message for  daughter [name] Disposition Plan:  residential hospice    Procedures/Studies: Dg Chest 2 View  09/09/2014   CLINICAL DATA:  82 year old male with fever, cough and congestion  EXAM: CHEST  2 VIEW  COMPARISON:  Prior chest x-ray 09/07/2014  FINDINGS: Overall, there has been some improvement in diffuse bilateral interstitial and airspace opacities. However, there remains some focal airspace opacification within the right mid lung. No pleural effusion. No pneumothorax. Stable cardiomegaly and mediastinal contours. Atherosclerotic calcification remains present within the transverse aorta.  IMPRESSION: 1. Improving pulmonary edema. 2. Residual patchy airspace opacity in the right mid lung in the region of the minor fissure may represent superimposed infiltrate/pneumonia, or perhaps some fluid trapped within the minor fissure. 3. Stable cardiomegaly.   Electronically Signed   By: Jacqulynn Cadet M.D.   On: 09/09/2014 10:21   US Renal  09/17/2014   CLINICAL DATA:  Acute renal insufficiency  EXAM: RENAL / URINARY TRACT ULTRASOUND COMPLETE  COMPARISON:  None.  FINDINGS: Right Kidney:  Length: 10.2 cm. Echogenicity within normal limits. No mass or hydronephrosis visualized.  Left Kidney:  Length: 10.5 cm.  Limited views, negative for hydronephrosis.  Bladder:  Not seen.  IMPRESSION: Negative for hydronephrosis.  Limited views of the left kidney.   Electronically Signed   By: Andreas Newport M.D.   On: 09/17/2014 02:44   Dg Chest Portable 1 View  10/06/2014   CLINICAL DATA:  Shortness of breath and wheezing.  Possible fever.  EXAM: PORTABLE CHEST - 1 VIEW  COMPARISON:  Single view of the chest 09/18/2014 and 09/15/2014.  FINDINGS: Extensive bilateral airspace disease seen on the most recent examination persists and appears somewhat worsened in the left mid lung zone. Aeration in the right chest appears mildly improved. There is cardiomegaly. No pneumothorax or pleural effusion is identified.  IMPRESSION: Multifocal airspace disease has an  appearance most compatible with pneumonia. There is increased airspace opacity in the left mid lung zone. Aeration in the right chest appears somewhat improved.   Electronically Signed   By: Inge Rise M.D.   On: 10/06/2014 16:22   Dg Chest Port 1 View  09/18/2014   CLINICAL DATA:  Leukocytosis  EXAM: PORTABLE CHEST - 1 VIEW  COMPARISON:  09/15/2014  FINDINGS: Multifocal patchy opacities in the right upper and lower lobes and left prior hilar region, suspicious for multifocal pneumonia, less likely interstitial edema. No definite pleural effusions. No pneumothorax.  The heart is top-normal in size.  IMPRESSION: Suspected multifocal pneumonia in the bilateral upper lobes and right lower lobe, grossly unchanged.   Electronically Signed   By: Julian Hy M.D.   On: 09/18/2014 08:46   Dg Chest Port 1 View  09/15/2014   CLINICAL DATA:  Fever and difficulty breathing  EXAM: PORTABLE CHEST - 1 VIEW  COMPARISON:  September 09, 2014  FINDINGS: There is generalized interstitial edema. There remains slight alveolar consolidation in the mid right lung, less than on recent prior study. Heart is enlarged. The pulmonary vascular is within normal limits. No adenopathy.  IMPRESSION: Findings consistent with congestive heart failure. A small focus of pneumonia in the right mid lung cannot be excluded.   Electronically Signed   By: Lowella Grip III M.D.   On: 09/15/2014 16:53         Subjective: Patient is more alert and awake today. He is pleasantly confused. However he is answering appropriately. Denies any fevers, headache, chest pain, shortness breath, abdominal pain, vomiting, diarrhea.  Objective: Filed  Vitals:   10/08/14 0348 10/08/14 0832 10/08/14 0844 10/08/14 1230  BP: 121/53  123/55 124/62  Pulse: 71  58 69  Temp: 97.5 F (36.4 C)  98.1 F (36.7 C) 98.7 F (37.1 C)  TempSrc: Oral  Oral Oral  Resp: 21  18 16   Height:      Weight:      SpO2: 93% 94% 94% 99%    Intake/Output Summary  (Last 24 hours) at 10/08/14 1340 Last data filed at 10/08/14 1229  Gross per 24 hour  Intake    100 ml  Output   2351 ml  Net  -2251 ml   Weight change: -4.5 kg (-9 lb 14.7 oz) Exam:   General:  Pt is alert, follows commands appropriately, not in acute distress  HEENT: No icterus, No thrush, No neck mass, [place]/AT  Cardiovascular: RRR, S1/S2, no rubs, no gallops  Respiratory: Bibasilar crackles. No wheezing. Good air movement.  Abdomen: Soft/+BS, non tender, non distended, no guarding; no hepatosplenomegaly Extremities: Right AKA site without any erythema or drainage. Left heel without any crepitance on by mouth drainage or lymphangitis. No visible rashes. Dorsalis pedis pulses not palpable on the left. Data Reviewed: Basic Metabolic Panel:  Recent Labs Lab 10/06/14 1520 10/07/14 0308 10/08/14 0258  NA 144 147* 149*  K 4.6 4.0 3.9  CL 115* 117* 116*  CO2 19* 21* 23  GLUCOSE 108* 83 79  BUN 71* 64* 48*  CREATININE 2.81* 2.65* 2.62*  CALCIUM 8.6* 8.6* 8.3*   Liver Function Tests: No results for input(s): AST, ALT, ALKPHOS, BILITOT, PROT, ALBUMIN in the last 168 hours. No results for input(s): LIPASE, AMYLASE in the last 168 hours. No results for input(s): AMMONIA in the last 168 hours. CBC:  Recent Labs Lab 10/06/14 1520 10/07/14 0308 10/08/14 0258  WBC 9.3 8.3 7.0  NEUTROABS 8.0*  --   --   HGB 8.6* 8.5* 8.6*  HCT 29.7* 29.4* 30.5*  MCV 90.5 90.7 92.1  PLT 363 339 327   Cardiac Enzymes: No results for input(s): CKTOTAL, CKMB, CKMBINDEX, TROPONINI in the last 168 hours. BNP: Invalid input(s): POCBNP CBG:  Recent Labs Lab 10/07/14 1154 10/07/14 1520 10/07/14 2203 10/08/14 0843 10/08/14 1223  GLUCAP 88 73 72 81 88    Recent Results (from the past 240 hour(s))  Blood culture (routine x 2)     Status: None (Preliminary result)   Collection Time: 10/06/14  5:45 PM  Result Value Ref Range Status   Specimen Description BLOOD RIGHT ARM  Final   Special  Requests BOTTLES DRAWN AEROBIC AND ANAEROBIC 5ML  Final   Culture NO GROWTH < 24 HOURS  Final   Report Status PENDING  Incomplete  Blood culture (routine x 2)     Status: None (Preliminary result)   Collection Time: 10/06/14  6:00 PM  Result Value Ref Range Status   Specimen Description BLOOD RIGHT HAND  Final   Special Requests BOTTLES DRAWN AEROBIC AND ANAEROBIC 5ML  Final   Culture NO GROWTH < 24 HOURS  Final   Report Status PENDING  Incomplete  MRSA PCR Screening     Status: None   Collection Time: 10/06/14 11:30 PM  Result Value Ref Range Status   MRSA by PCR NEGATIVE NEGATIVE Final    Comment:        The GeneXpert MRSA Assay (FDA approved for NASAL specimens only), is one component of a comprehensive MRSA colonization surveillance program. It is not intended to diagnose MRSA infection  nor to guide or monitor treatment for MRSA infections.      Scheduled Meds: . allopurinol  100 mg Oral QHS  . amLODipine  10 mg Oral Daily  . antiseptic oral rinse  7 mL Mouth Rinse BID  . aspirin EC  81 mg Oral Daily  . atorvastatin  40 mg Oral Daily  . heparin  5,000 Units Subcutaneous 3 times per day  . hydrALAZINE  100 mg Oral TID  . insulin aspart  0-9 Units Subcutaneous TID WC  . isosorbide mononitrate  30 mg Oral Daily  . levothyroxine  125 mcg Oral QAC breakfast  . mometasone-formoterol  2 puff Inhalation BID  . mupirocin cream   Topical Daily  . pantoprazole  40 mg Oral Daily  . piperacillin-tazobactam (ZOSYN)  IV  3.375 g Intravenous Q8H  . vancomycin  1,500 mg Intravenous Q48H   Continuous Infusions:    Tatiana Courter, DO  [facility] Pager [phone]  If 7PM-7AM, please contact night-coverage [email or web address] Password [email or web address] 10/08/2014, 1:40 PM   LOS: 2 days

## 2014-10-08 NOTE — Care Management Note (Signed)
Case Management Note  Patient Details  Name: Travis POFFENBERGER Sr. MRN: [id number] Date of Birth: 1933-02-06  Subjective/Objective:               Pt from SNF([facility]) admitted with SOB/PNA,  history of CKD stage IV, diastolic CHF, myelodysplastic syndrome, diabetes mellitus, atrial fibrillation, peripheral vascular disease, and coronary artery disease.The patient was recently discharged from the hospital on 09/23/2014 after a prolonged hospital stay during which he had a right above-the-knee amputation as well as CHF exacerbation.         Action/Plan:  Residential hospice   Expected Discharge Date:                  Expected Discharge Plan:  [place]  In-House Referral:  Clinical Social Work  Discharge planning Services  CM Consult  Post Acute Care Choice:    Choice offered to:     DME Arranged:    DME Agency:     HH Arranged:    [place] Agency:     Status of Service:  In process, will continue to follow  Medicare Important Message Given:  Yes-second notification given Date Medicare IM Given:    Medicare IM give by:    Date Additional Medicare IM Given:    Additional Medicare Important Message give by:     If discussed at [place] of Stay Meetings, dates discussed:    Additional Comments: [name] (Daughter)[phone], Althia Forts (Other)  [phone]  Sharin Mons, RN  10/08/2014, 5:23 PM

## 2014-10-08 NOTE — Clinical Social Work Note (Signed)
Clinical Social Work Assessment  Patient Details  Name: Travis PUEBLA Sr. MRN: [id number] Date of Birth: [birth year]  Date of referral:  10/08/14               Reason for consult:  End of Life/Hospice                Permission sought to share information with:  Facility Sport and exercise psychologist, Family Supports Permission granted to share information::  Yes, Verbal Permission Granted  Name::     [name]  Agency::  [place]  Relationship::  daughter  Contact Information:     Housing/Transportation Living arrangements for the past 2 months:  Single Family Home Source of Information:  Adult Children Patient Interpreter Needed:  None Criminal Activity/Legal Involvement Pertinent to Current Situation/Hospitalization:  No - Comment as needed Significant Relationships:  Adult Children Lives with:  Self Do you feel safe going back to the place where you live?  No Need for family participation in patient care:  Yes (Comment)  Care giving concerns:  Pt daughter is very involved but does not feel like taking pt home is an option at this time   Facilities manager / plan:  CSW spoke with pt daughter concerning Palliative recommendation for residential hospice  Employment status:  Retired Forensic scientist:  Managed Medicare PT Recommendations:  Not assessed at this time Information / Referral to community resources:   (hospice facilities)  Patient/Family's Response to care:  Pt daughter is agreeable to residential hospice placement.  Patient/Family's Understanding of and Emotional Response to Diagnosis, Current Treatment, and Prognosis: Hopeful that pt will be made comfortable at hospice- had no questions or concerns about prognosis/treatment plan  Emotional Assessment Appearance:  Appears stated age Attitude/Demeanor/Rapport:  Unable to Assess Affect (typically observed):  Unable to Assess Orientation:  Oriented to Self, Oriented to  Place Alcohol / Substance use:  Not Applicable Psych involvement (Current and /or in the community):  No (Comment)  Discharge Needs  Concerns to be addressed:  Discharge Planning Concerns, Care Coordination Readmission within the last 30 days:  Yes Current discharge risk:  Terminally ill Barriers to Discharge:  Hospice Bed not available   Cranford Mon, LCSW 10/08/2014, 10:57 AM

## 2014-10-08 NOTE — Care Management Important Message (Signed)
Important Message  Patient Details  Name: Travis LITTLES Sr. MRN: [id number] Date of Birth: [birth year]   Medicare Important Message Given:  Yes-second notification given    Sharin Mons, RN 10/08/2014, 2:59 PM

## 2014-10-08 NOTE — Progress Notes (Signed)
[facility] is continuing to follow patient- they will re-evaluate patient tomorrow (8/20) for possible admission.  [facility] does not anticipate having beds.  CSW will continue to follow.  Domenica Reamer, [place] Social Worker [phone]

## 2014-10-08 NOTE — Progress Notes (Signed)
Daily Progress Note   Patient Name: Travis TEASDALE Sr.       Date: 10/08/2014 DOB: 01/17/1933  Age: 81 y.o. MRN#: [id number] Attending Physician: Orson Eva, MD Primary Care Physician: [name], MD Admit Date: 10/06/2014  Reason for Consultation/Follow-up: Establishing goals of care  Subjective: 81 year old male with a history of CKD stage IV, diastolic CHF, myelodysplastic syndrome, diabetes mellitus, atrial fibrillation, peripheral vascular disease, and coronary artery disease recently discharged from the hospital on 09/23/2014 after a prolonged hospital stay during which he had a right above-the-knee amputation secondary to nonhealing leg wounds as well as CHF exacerbation. He was also seen by nephrology who did not feel that he was a good dialysis candidate. His last hospital stay is complicated by pneumonia with 10 days of vancomycin and Zosyn. The patient was on his way to [place] center, and he was diverted to [facility] ED due to worsening shortness of breath. Upon presentation to the emergency department, the patient was noted to be on 6 L nasal cannula and chest x-ray showed multifocal opacities bilaterally. He is currently being treated for recurrence of the pneumonia. Palliative care was consulted to readdress goals of care in light of recurrence of pneumonia in conjunction with his continued worsening of his chronic medical problems.  Interval Events: Met with [name] this morning. No family is present at this time.   He was meeting with speech therapy at time of my arrival. He denies any complaints this morning and appears to be in good spirits and joking with staff. Reports his breathing feels better as well.  Length of Stay: 2 days  Current Medications: Scheduled Meds:  . allopurinol  100 mg Oral QHS  . amLODipine  10 mg Oral Daily  . antiseptic oral rinse  7 mL Mouth Rinse BID  . aspirin EC  81 mg Oral Daily  . atorvastatin  40 mg Oral Daily  . heparin  5,000 Units  Subcutaneous 3 times per day  . hydrALAZINE  100 mg Oral TID  . insulin aspart  0-9 Units Subcutaneous TID WC  . isosorbide mononitrate  30 mg Oral Daily  . levothyroxine  125 mcg Oral QAC breakfast  . mometasone-formoterol  2 puff Inhalation BID  . mupirocin cream   Topical Daily  . pantoprazole  40 mg Oral Daily  . piperacillin-tazobactam (ZOSYN)  IV  3.375 g Intravenous Q8H  . vancomycin  1,500 mg Intravenous Q48H    Continuous Infusions:    PRN Meds: acetaminophen **OR** acetaminophen, albuterol, ALPRAZolam, ondansetron **OR** ondansetron (ZOFRAN) IV  Palliative Performance Scale: 30%     Vital Signs: BP 123/55 mmHg  Pulse 58  Temp(Src) 98.1 F (36.7 C) (Oral)  Resp 18  Ht 5' 10"  (1.778 m)  Wt 96.3 kg (212 lb 4.9 oz)  BMI 30.46 kg/m2  SpO2 94% SpO2: SpO2: 94 % O2 Device: O2 Device: Nasal Cannula O2 Flow Rate: O2 Flow Rate (L/min): 2 L/min  Intake/output summary:  Intake/Output Summary (Last 24 hours) at 10/08/14 1047 Last data filed at 10/08/14 9163  Gross per 24 hour  Intake    100 ml  Output   2651 ml  Net  -2551 ml   LBM:   Baseline Weight: Weight: 100.8 kg (222 lb 3.6 oz) Most recent weight: Weight: 96.3 kg (212 lb 4.9 oz)  Physical Exam:  General: Pt is alert, follows commands appropriately, not in acute distress  HEENT: No icterus, No thrush, [place]/AT  Cardiovascular: RRR, ejection murmur noted  Respiratory: bibasilar crackles and some scattered wheezing. Good air movement.   Abdomen: Soft/+BS, non tender, non distended, no guarding  Extremities: No edema,right above-the-knee amputation site without erythema   Additional Data Reviewed: Recent Labs     10/07/14  0308  10/08/14  0258  WBC  8.3  7.0  HGB  8.5*  8.6*  PLT  339  327  NA  147*  149*  BUN  64*  48*  CREATININE  2.65*  2.62*     Problem List:  Patient Active Problem List   Diagnosis Date Noted  . Pressure ulcer 10/07/2014  . Second degree Mobitz I AV block -  suprahisian and infrahisian conduction disease 10/07/2014  . Hypoxia   . Palliative care by specialist   . Goals of care, counseling/discussion   . Respiratory failure, acute and chronic 10/06/2014  . Acute on chronic renal failure   . Palliative care encounter   . Chronic renal failure 09/06/2014  . Wound, open, foot 09/06/2014  . Diabetic foot ulcer 09/06/2014  . Achilles tendinosis 07/13/2014  . Pain and swelling of left lower leg 06/22/2014  . Pain of left heel 06/22/2014  . Acute on chronic diastolic congestive heart failure 03/10/2014  . Myelodysplastic syndrome-hx of transfusions 03/10/2014  . Limited mobility- wheel chair for the last year 03/10/2014  . Chronic renal insufficiency, stage III-IV 03/10/2014  . Second degree AV block, Mobitz type I 01/27/2014  . Bradycardia, sinus 10/24/2013  . NSVT (nonsustained ventricular tachycardia) 10/24/2013  . On amiodarone therapy 10/24/2013  . Secondary malignant neoplasm of bone and bone marrow 10/22/2013  . HCAP (healthcare-associated pneumonia) 10/22/2013  . Acute on chronic respiratory failure with hypoxia 10/22/2013  . Acute on chronic diastolic CHF (congestive heart failure), NYHA class 1 10/21/2013  . Bronchiectasis with acute exacerbation 10/21/2013  . Mobility impaired 09/19/2013  . Chronic respiratory failure 07/09/2013  . Anemia of chronic disease 05/06/2013  . History of prostate cancer- 15 yrs ago 05/06/2013  . Abnormal bone radiograph 04/23/2013  . Normochromic normocytic anemia 04/21/2013  . Abnormal CXR 04/21/2013  . Hypertensive kidney disease with CKD stage III 04/21/2013  . Anemia 04/20/2013  . Cellulitis of leg, left 04/03/2013  . Open wound of left lower leg 04/03/2013  . Nocturnal hypoxemia 10/02/2012  . Stasis ulcer of left ankle 09/12/2012  . Laceration of left lower extremity 09/02/2012  . Bilateral hearing loss 11/27/2010  . Edema 10/18/2010  . Hilar adenopathy 10/18/2010  . Cough 09/21/2010  .  Hyperkalemia 07/04/2010  . Epistaxis 07/03/2010  . Preventative health care 05/29/2010  . Allergic rhinitis, cause unspecified 05/29/2010  . Cervical radiculitis 05/29/2010  . PERIPHERAL NEUROPATHY 03/15/2009  . INSOMNIA 10/14/2008  . Type 2 diabetes mellitus with renal manifestations 06/03/2008  . Chronic diastolic heart failure 16/11/9602  . CHRONIC KIDNEY DISEASE STAGE III (MODERATE) 06/03/2008  . Hypothyroid 05/25/2008  . Gout 05/25/2008  . ANXIETY 05/25/2008  . CAD - moderate 3V CAD April 2012 05/25/2008  . Other second degree atrioventricular block 05/25/2008  . PEPTIC ULCER DISEASE 05/25/2008  . PROSTATE CANCER, HX OF 05/25/2008  . ALCOHOL ABUSE, EPISODIC, HX OF 05/25/2008  . COLONIC POLYPS, HX OF 05/25/2008  . ABNORMAL EKG 04/12/2008  . Paroxysmal atrial fibrillation- in AF 03/10/14 01/22/2008  . Obstructive sleep apnea 06/06/2007  . Dyslipidemia 10/11/2006  . Essential hypertension 10/11/2006  . BENIGN PROSTATIC HYPERTROPHY 10/11/2006  . OSTEOARTHRITIS 10/11/2006  . CEREBROVASCULAR ACCIDENT, HX OF 10/11/2006     Palliative Care Assessment &  Plan    Code Status:  DNR  Goals of Care: There was a great amount of discussion regarding disposition during his last hospitalization. The family decided to pursue rehabilitation course to see how he would progress. He has not done well at rehab with multiple falls and development of recurrent pneumonia. Patient's daughters in agreement that they would like to pursue placement at [facility] as they feel this would be in line with their father's wishes.  Symptom Management:  He reports feeling well this morning. Recommend continue with feeds for comfort.  Psycho-social/Spiritual:  Desire for further Chaplaincy support:no   Prognosis: He has multiple worsening comorbid conditions including cardiac, pulmonary, renal, hematologic, and vascular compromise. He is not a good candidate for pacemaker or dialysis.  Based upon his  current conditions of heart block/CHF, renal disease, respiratory failure secondary to PNA, I would anticipate 2-4 weeks once shift is made to focus on comfort care approach.   He does appear to be feeling better today, however he has demonstrated his frailty with this recurrent admission and his overall trajectory is likely to be terminal in short course. Discharge Planning: Family desires placement of the complaints   Care plan was discussed with speech therapy and social work.  Thank you for allowing the Palliative Medicine Team to assist in the care of this patient.   Time In: 1030 Time Out: 1100 Total Time 25 Prolonged Time Billed  no     Greater than 50%  of this time was spent counseling and coordinating care related to the above assessment and plan.   Micheline Rough, MD  10/08/2014, 10:47 AM  Please contact Palliative Medicine Team phone at [phone] for questions and concerns.

## 2014-10-08 NOTE — Progress Notes (Signed)
Patient Profile: 81 year old male with a history of diastolic heart failure as well as 2nd degree heart block (Wenckebach), hypertension, hyperlipidemia, diabetes, previous CVA, h/o prostate CA, PVD s/p right AKA and paroxysmal atrial fibrillation previously on amiodarone. Not on oral anticoagulation due to high bleed risk 2/2 heavy ETOH use. Admitted for HCAP/ aspiration PNA.   Subjective: Difficult to understands as he mumbles. Does not appear to be in any distress. Sitter by bedside.   Objective: Vital signs in last 24 hours: Temp:  [97.5 F (36.4 C)-98.8 F (37.1 C)] 98.1 F (36.7 C) (08/19 0844) Pulse Rate:  [58-84] 58 (08/19 0844) Resp:  [15-21] 18 (08/19 0844) BP: (115-144)/(47-58) 123/55 mmHg (08/19 0844) SpO2:  [89 %-98 %] 94 % (08/19 0844) Weight:  [212 lb 4.9 oz (96.3 kg)] 212 lb 4.9 oz (96.3 kg) (08/19 0321) Last BM Date: 10/07/14  Intake/Output from previous day: 08/18 0701 - 08/19 0700 In: 100 [IV Piggyback:100] Out: 3150 [Urine:3150] Intake/Output this shift: Total I/O In: 0  Out: 601 [Urine:600; Stool:1]  Medications Current Facility-Administered Medications  Medication Dose Route Frequency Provider Last Rate Last Dose  . acetaminophen (TYLENOL) tablet 650 mg  650 mg Oral Q6H PRN Domenic Polite, MD   650 mg at 10/08/14 0411   Or  . acetaminophen (TYLENOL) suppository 650 mg  650 mg Rectal Q6H PRN Domenic Polite, MD      . albuterol (PROVENTIL) (2.5 MG/3ML) 0.083% nebulizer solution 2.5 mg  2.5 mg Nebulization Q4H PRN Domenic Polite, MD      . allopurinol (ZYLOPRIM) tablet 100 mg  100 mg Oral QHS Domenic Polite, MD   100 mg at 10/07/14 2223  . ALPRAZolam Duanne Moron) tablet 0.25 mg  0.25 mg Oral TID PRN Domenic Polite, MD   0.25 mg at 10/08/14 0411  . amLODipine (NORVASC) tablet 10 mg  10 mg Oral Daily Domenic Polite, MD   10 mg at 10/07/14 1013  . antiseptic oral rinse (CPC / CETYLPYRIDINIUM CHLORIDE 0.05%) solution 7 mL  7 mL Mouth Rinse BID Domenic Polite, MD    7 mL at 10/07/14 2224  . aspirin EC tablet 81 mg  81 mg Oral Daily Domenic Polite, MD   81 mg at 10/07/14 1013  . atorvastatin (LIPITOR) tablet 40 mg  40 mg Oral Daily Domenic Polite, MD   40 mg at 10/07/14 1014  . heparin injection 5,000 Units  5,000 Units Subcutaneous 3 times per day Domenic Polite, MD   5,000 Units at 10/08/14 4010  . hydrALAZINE (APRESOLINE) tablet 100 mg  100 mg Oral TID Domenic Polite, MD   100 mg at 10/07/14 2223  . insulin aspart (novoLOG) injection 0-9 Units  0-9 Units Subcutaneous TID WC Domenic Polite, MD   0 Units at 10/07/14 0800  . isosorbide mononitrate (IMDUR) 24 hr tablet 30 mg  30 mg Oral Daily Orson Eva, MD   30 mg at 10/07/14 1230  . levothyroxine (SYNTHROID, LEVOTHROID) tablet 125 mcg  125 mcg Oral QAC breakfast Domenic Polite, MD   125 mcg at 10/07/14 1013  . mometasone-formoterol (DULERA) 100-5 MCG/ACT inhaler 2 puff  2 puff Inhalation BID Domenic Polite, MD   2 puff at 10/08/14 [phone]  . mupirocin cream (BACTROBAN) 2 %   Topical Daily Orson Eva, MD      . ondansetron [facility]) tablet 4 mg  4 mg Oral Q6H PRN Domenic Polite, MD       Or  . ondansetron [facility]) injection 4 mg  4 mg Intravenous  Q6H PRN Domenic Polite, MD      . pantoprazole (PROTONIX) EC tablet 40 mg  40 mg Oral Daily Domenic Polite, MD   40 mg at 10/07/14 1000  . piperacillin-tazobactam (ZOSYN) IVPB 3.375 g  3.375 g Intravenous Q8H Orson Eva, MD   3.375 g at 10/08/14 0320  . vancomycin (VANCOCIN) 1,500 mg in sodium chloride 0.9 % 500 mL IVPB  1,500 mg Intravenous Q48H Meagan Ival Bible, RPH        PE: General appearance: alert, cooperative and no distress Neck: no carotid bruit and no JVD Lungs: clear to auscultation bilaterally Heart: irregularly irregular rhythm and controlled rate Extremities: s/p right AKA, no edema Pulses: 2+ and symmetric Skin: warm and dry Neurologic: Grossly normal  Lab Results:   Recent Labs  10/06/14 1520 10/07/14 0308 10/08/14 0258  WBC 9.3 8.3 7.0  HGB  8.6* 8.5* 8.6*  HCT 29.7* 29.4* 30.5*  PLT 363 339 327   BMET  Recent Labs  10/06/14 1520 10/07/14 0308 10/08/14 0258  NA 144 147* 149*  K 4.6 4.0 3.9  CL 115* 117* 116*  CO2 19* 21* 23  GLUCOSE 108* 83 79  BUN 71* 64* 48*  CREATININE 2.81* 2.65* 2.62*  CALCIUM 8.6* 8.6* 8.3*    Assessment/Plan  Principal Problem:   HCAP (healthcare-associated pneumonia) Active Problems:   Type 2 diabetes mellitus with renal manifestations   Paroxysmal atrial fibrillation- in AF 03/10/14   Open wound of left lower leg   Acute on chronic respiratory failure with hypoxia   Myelodysplastic syndrome-hx of transfusions   Chronic renal insufficiency, stage III-IV   Respiratory failure, acute and chronic   Pressure ulcer   Second degree Mobitz I AV block - suprahisian and infrahisian conduction disease   Hypoxia   Palliative care by specialist   Goals of care, counseling/discussion   1. Bradycardia: currently in afib/flutter with CVR. Still with occasional nocturnal bradycardia with occasional drops in the 40s. HR currently stable in the mid 60s. Continue to hold amiodarone and avoid use of other AVN blocking agents. Will avoid device implantation in this gentleman with advance vascular/respiratory/hematological and kidney disease and poor prognosis.  2. PAF: currently in afib/flutter but with a CVR in the 60s. Amiodarone was discontinued due to significant bradycardia. Continue to avoid use of other AVN blocking agents. Not an anticoagulation candidate due to high bleed risk (advanced age, fall risk and ETOH abuse).    LOS: 2 days    Brittainy M. Rosita Fire, PA-C 10/08/2014 12:30 PM  I have examined the patient and reviewed assessment and plan and discussed with patient.  Agree with above as stated.  D/w Dr. Carles Collet.  Patient care is moving more towards comfort care. Avoid rate slowing drugs. Will sign off.    Masae Lukacs S.

## 2014-10-09 LAB — PROCALCITONIN: Procalcitonin: 0.26 ng/mL

## 2014-10-09 NOTE — Progress Notes (Signed)
PROGRESS NOTE  [name] Sr. ALP:[id number] DOB: [birth year] DOA: 10/06/2014 PCP: [name], MD  Brief history 81 year old male with a history of CKD stage IV, diastolic CHF, myelodysplastic syndrome, diabetes mellitus, atrial fibrillation, peripheral vascular disease, and coronary artery disease presented with increasing shortness of breath. The patient was recently discharged from the hospital on 09/23/2014 after a prolonged hospital stay during which he had a right above-the-knee amputation secondary to nonhealing leg wounds as well as CHF exacerbation. The patient was discharged with torsemide 80 mg twice a day. During that hospital stay, the patient was seen by palliative medicine. There continues to be incongruent desires within the patient's family regarding his level of care and goals of care. He was also seen by nephrology who did not feel that he was a good dialysis candidate. During his last hospital stay, the patient was also treated for pneumonia with 10 days of vancomycin and Zosyn. The patient was on his way to [facility], and he was diverted to [facility] ED due to worsening shortness of breath. Upon presentation to the emergency department, the patient was noted to be on 6 L nasal cannula and chest x-ray showed multifocal opacities bilaterally. Over the past 2 weeks, the patient has required increasing oxygen demand. He was discharged on 2 L.  Assessment/Plan: Acute on chronic respiratory failure -Secondary to pneumonia, suspect aspiration/HCAP -Blood cultures negative to date -Lactic acid 1.43 -Procalcitonin 0.28-->0.26 -Presently stable on 3 L nasal cannula with oxygen saturation 95-95% percent HCAP/Aspiration pneumonia -speech therapy eval--dysphagia 2 with thin liquids -Antibiotics as discussed above -Pulmonary hygiene -Discontinue vancomycin, continue Zosyn Chronic diastolic CHF -The patient does not appear to be fluid overloaded clinically -Patient was  discharged with torsemide 80 mg twice a day from his last admission in August 2016--continue furosemide 80 mg IV twice a day -03/11/2014 echocardiogram EF 55-60% -daily weights Goals of Care -palliative medicine consult appreciated -Patient's daughters initially in agreement that they would like to pursue placement at [facility] as they feel this would be in line with their father's wishes. -10/09/2014--patient's daughter [name] (HPOA)--having second thoughts about transfer to residential hospice--she refused transfer today and wants to continue aggressive care -case discussed with Dr. Domingo Cocking CKD stage IV -Serum creatinine 3.85 at the time of discharge 09/23/2014 -Monitor renal function off diuretics Paroxysmal atrial fibrillation with type 2 mobitiz AV block -10/07/14 AM, the patient appears to be in Mobitz type II AV block -Appreciate cardiology -Not a good candidate for PPM -Optimize electrolytes -Potassium 4.0 -d/c amiodarone per cardiology, avoid AV nodal blocking agents -continue ASA Diabetes mellitus type 2 -Hold Levemir due to poor oral intake and CBGs in 70-80 range -08/04/2014 hemoglobin A1c 8.2 -d/c CBGs for now and d/c ISS if focus of care more toward comfort Peripheral vascular disease -Status post right AKA 09/13/2014 --Dr. [name] Given  -discussed with Dr. Harrie Foreman to remove staples from stump -Right stump looks clean without any signs of infection  Hypertension  -D/C amlodipine, hydralazine and Imdur as BP remains on soft side Hypothyroidism  -Continue Synthroid  Dyslipidemia  -Continue statin  Sclerotic lesion on ribs on CXR -patient has sclerotic lesions on the ribs on CXR, -Dr [name] is aware of this finding as per his last note in epic. He will follow the patient as outpatient.  Myelodysplastic syndrome-hx of transfusions/ Anemia of chronic disease -Hb stable -Give the patient's scheduled dose of Aranesp 10/07/14   Family Communication: updated daughter  [name]--total time 35 min.  >50% spent counseling and coordinating care Disposition Plan: residential hospice when family agreeable     Procedures/Studies: US Renal  09/17/2014   CLINICAL DATA:  Acute renal insufficiency  EXAM: RENAL / URINARY TRACT ULTRASOUND COMPLETE  COMPARISON:  None.  FINDINGS: Right Kidney:  Length: 10.2 cm. Echogenicity within normal limits. No mass or hydronephrosis visualized.  Left Kidney:  Length: 10.5 cm.  Limited views, negative for hydronephrosis.  Bladder:  Not seen.  IMPRESSION: Negative for hydronephrosis.  Limited views of the left kidney.   Electronically Signed   By: Andreas Newport M.D.   On: 09/17/2014 02:44   Dg Chest Portable 1 View  10/06/2014   CLINICAL DATA:  Shortness of breath and wheezing.  Possible fever.  EXAM: PORTABLE CHEST - 1 VIEW  COMPARISON:  Single view of the chest 09/18/2014 and 09/15/2014.  FINDINGS: Extensive bilateral airspace disease seen on the most recent examination persists and appears somewhat worsened in the left mid lung zone. Aeration in the right chest appears mildly improved. There is cardiomegaly. No pneumothorax or pleural effusion is identified.  IMPRESSION: Multifocal airspace disease has an appearance most compatible with pneumonia. There is increased airspace opacity in the left mid lung zone. Aeration in the right chest appears somewhat improved.   Electronically Signed   By: Inge Rise M.D.   On: 10/06/2014 16:22   Dg Chest Port 1 View  09/18/2014   CLINICAL DATA:  Leukocytosis  EXAM: PORTABLE CHEST - 1 VIEW  COMPARISON:  09/15/2014  FINDINGS: Multifocal patchy opacities in the right upper and lower lobes and left prior hilar region, suspicious for multifocal pneumonia, less likely interstitial edema. No definite pleural effusions. No pneumothorax.  The heart is top-normal in size.  IMPRESSION: Suspected multifocal pneumonia in the bilateral upper lobes and right lower lobe, grossly unchanged.   Electronically  Signed   By: Julian Hy M.D.   On: 09/18/2014 08:46   Dg Chest Port 1 View  09/15/2014   CLINICAL DATA:  Fever and difficulty breathing  EXAM: PORTABLE CHEST - 1 VIEW  COMPARISON:  September 09, 2014  FINDINGS: There is generalized interstitial edema. There remains slight alveolar consolidation in the mid right lung, less than on recent prior study. Heart is enlarged. The pulmonary vascular is within normal limits. No adenopathy.  IMPRESSION: Findings consistent with congestive heart failure. A small focus of pneumonia in the right mid lung cannot be excluded.   Electronically Signed   By: Lowella Grip III M.D.   On: 09/15/2014 16:53         Subjective: Patient is pleasant and confused but more alert. He denies any fevers chills, chest pain, short of breath, nausea, vomiting, diarrhea, abdominal pain.  Objective: Filed Vitals:   10/08/14 1637 10/08/14 1816 10/08/14 2111 10/09/14 0512  BP:  136/58 123/48 151/61  Pulse:  73 58 80  Temp: 99.4 F (37.4 C) 99 F (37.2 C) 98.8 F (37.1 C) 98.8 F (37.1 C)  TempSrc: Oral Oral Oral Oral  Resp:  22 18 20   Height:      Weight:    97.6 kg (215 lb 2.7 oz)  SpO2:  95% 97% 96%    Intake/Output Summary (Last 24 hours) at 10/09/14 1613 Last data filed at 10/09/14 0600  Gross per 24 hour  Intake 2196.25 ml  Output    800 ml  Net 1396.25 ml   Weight change: 1.3 kg (2 lb 13.9 oz) Exam:  General:  Pt is alert, follows commands appropriately, not in acute distress  HEENT: No icterus, No thrush, No neck mass, [place]/AT  Cardiovascular: RRR, S1/S2, no rubs, no gallops  Respiratory: bibasilar crackles. No wheezing. Good air movement.  Abdomen: Soft/+BS, non tender, non distended, no guarding; no hepatosplenomegaly   Extremities: No edema, No lymphangitis, No petechiae, No rashes, no synovitis; no cyanosis   Data Reviewed: Basic Metabolic Panel:  Recent Labs Lab 10/06/14 1520 10/07/14 0308 10/08/14 0258  NA 144 147* 149*  K  4.6 4.0 3.9  CL 115* 117* 116*  CO2 19* 21* 23  GLUCOSE 108* 83 79  BUN 71* 64* 48*  CREATININE 2.81* 2.65* 2.62*  CALCIUM 8.6* 8.6* 8.3*   Liver Function Tests: No results for input(s): AST, ALT, ALKPHOS, BILITOT, PROT, ALBUMIN in the last 168 hours. No results for input(s): LIPASE, AMYLASE in the last 168 hours. No results for input(s): AMMONIA in the last 168 hours. CBC:  Recent Labs Lab 10/06/14 1520 10/07/14 0308 10/08/14 0258  WBC 9.3 8.3 7.0  NEUTROABS 8.0*  --   --   HGB 8.6* 8.5* 8.6*  HCT 29.7* 29.4* 30.5*  MCV 90.5 90.7 92.1  PLT 363 339 327   Cardiac Enzymes: No results for input(s): CKTOTAL, CKMB, CKMBINDEX, TROPONINI in the last 168 hours. BNP: Invalid input(s): POCBNP CBG:  Recent Labs Lab 10/07/14 1520 10/07/14 2203 10/08/14 0843 10/08/14 1223 10/08/14 1626  GLUCAP 73 72 81 88 110*    Recent Results (from the past 240 hour(s))  Blood culture (routine x 2)     Status: None (Preliminary result)   Collection Time: 10/06/14  5:45 PM  Result Value Ref Range Status   Specimen Description BLOOD RIGHT ARM  Final   Special Requests BOTTLES DRAWN AEROBIC AND ANAEROBIC 5ML  Final   Culture NO GROWTH 3 DAYS  Final   Report Status PENDING  Incomplete  Blood culture (routine x 2)     Status: None (Preliminary result)   Collection Time: 10/06/14  6:00 PM  Result Value Ref Range Status   Specimen Description BLOOD RIGHT HAND  Final   Special Requests BOTTLES DRAWN AEROBIC AND ANAEROBIC 5ML  Final   Culture NO GROWTH 3 DAYS  Final   Report Status PENDING  Incomplete  MRSA PCR Screening     Status: None   Collection Time: 10/06/14 11:30 PM  Result Value Ref Range Status   MRSA by PCR NEGATIVE NEGATIVE Final    Comment:        The GeneXpert MRSA Assay (FDA approved for NASAL specimens only), is one component of a comprehensive MRSA colonization surveillance program. It is not intended to diagnose MRSA infection nor to guide or monitor treatment  for MRSA infections.      Scheduled Meds: . antiseptic oral rinse  7 mL Mouth Rinse BID  . aspirin EC  81 mg Oral Daily  . atorvastatin  40 mg Oral Daily  . heparin  5,000 Units Subcutaneous 3 times per day  . isosorbide mononitrate  30 mg Oral Daily  . levothyroxine  125 mcg Oral QAC breakfast  . mometasone-formoterol  2 puff Inhalation BID  . mupirocin cream   Topical Daily  . piperacillin-tazobactam (ZOSYN)  IV  3.375 g Intravenous Q8H  . vancomycin  1,500 mg Intravenous Q48H   Continuous Infusions: . dextrose 75 mL/hr at 10/08/14 1555     Laneah Luft, DO  [facility] Pager [phone]  If 7PM-7AM, please contact night-coverage [email or web address]  Password [email or web address] 10/09/2014, 4:13 PM   LOS: 3 days

## 2014-10-09 NOTE — Progress Notes (Signed)
Patient reports that he is feeling well today and denies complaints.  He has been accepted to [facility], but family now wanting to continue to pursue aggressive care as he seems to be improved from admission.  I attempted to call his daughter [name] to discuss further.  Left VM on her phone.    With his chronic problems, including recurrent aspiration, he is likely to decompensate again in the immediate future, and placement at [facility] still seems like the best option to focus on his goals that family have stated are most important to the patient.  I discussed with Dr. Carles Collet, and I will attempt to touch base with family tomorrow to continue discussion.  Micheline Rough, MD [place] Team [phone]

## 2014-10-09 NOTE — Clinical Social Work Note (Addendum)
Clinical Social Worker has contacted [facility] to request further evaluation for possible admission into facility for today, 8/20. Hospice facility is paging on-call MD and CSW awaiting a returned phone call from on-call MD.   2:01 PM- Patient scheduled to re-evaluated today by [facility].   CSW will continue to follow pt and pt's family for continued support and to facilitate pt's discharge needs once stable for transfer.   Glendon Axe, MSW, LCSWA [phone] 10/09/2014 1:46 PM

## 2014-10-09 NOTE — Clinical Social Work Note (Signed)
Clinical Social Worker contacted admissions coordinator, Manuela Schwartz with [facility] [place] [phone]. Per admissions coordinator, [facility] has accepted patient and was ready to admit patient today, 8/20 however patient's daughter declined residential hospice placement and expressed interest in meeting with attending MD in regards to aggressive treatment/measures.   MD notified. [facility] will continue to follow patient and family for admission. CSW will continue to follow pt and family for continued support and to facilitate pt's discharge needs once stable for transfer.   DC packet prepared and on chart.   Glendon Axe, MSW, LCSWA [phone] 10/09/2014 4:11 PM

## 2014-10-10 LAB — CBC
HCT: 31.8 % — ABNORMAL LOW (ref 39.0–52.0)
Hemoglobin: 9 g/dL — ABNORMAL LOW (ref 13.0–17.0)
MCH: 25.7 pg — ABNORMAL LOW (ref 26.0–34.0)
MCHC: 28.3 g/dL — ABNORMAL LOW (ref 30.0–36.0)
MCV: 90.9 fL (ref 78.0–100.0)
PLATELETS: 287 10*3/uL (ref 150–400)
RBC: 3.5 MIL/uL — AB (ref 4.22–5.81)
RDW: 24.9 % — AB (ref 11.5–15.5)
WBC: 4.8 10*3/uL (ref 4.0–10.5)

## 2014-10-10 LAB — BASIC METABOLIC PANEL
ANION GAP: 8 (ref 5–15)
BUN: 34 mg/dL — ABNORMAL HIGH (ref 6–20)
CALCIUM: 7.9 mg/dL — AB (ref 8.9–10.3)
CO2: 21 mmol/L — ABNORMAL LOW (ref 22–32)
Chloride: 112 mmol/L — ABNORMAL HIGH (ref 101–111)
Creatinine, Ser: 2.05 mg/dL — ABNORMAL HIGH (ref 0.61–1.24)
GFR, EST AFRICAN AMERICAN: 34 mL/min — AB (ref >60)
GFR, EST NON AFRICAN AMERICAN: 29 mL/min — AB (ref >60)
Glucose, Bld: 127 mg/dL — ABNORMAL HIGH (ref 65–99)
Potassium: 3.6 mmol/L (ref 3.5–5.1)
SODIUM: 141 mmol/L (ref 135–145)

## 2014-10-10 LAB — C DIFFICILE QUICK SCREEN W PCR REFLEX
C DIFFICILE (CDIFF) TOXIN: NEGATIVE
C Diff antigen: POSITIVE — AB

## 2014-10-10 LAB — MAGNESIUM: Magnesium: 1.7 mg/dL (ref 1.7–2.4)

## 2014-10-10 MED ORDER — POTASSIUM CHLORIDE 10 MEQ/100ML IV SOLN
10.0000 meq | Freq: Once | INTRAVENOUS | Status: AC
  Administered 2014-10-11: 10 meq via INTRAVENOUS
  Filled 2014-10-10: qty 100

## 2014-10-10 MED ORDER — MAGNESIUM SULFATE 2 GM/50ML IV SOLN
2.0000 g | Freq: Once | INTRAVENOUS | Status: AC
  Administered 2014-10-10: 2 g via INTRAVENOUS
  Filled 2014-10-10: qty 50

## 2014-10-10 MED ORDER — POTASSIUM CHLORIDE 10 MEQ/100ML IV SOLN
10.0000 meq | INTRAVENOUS | Status: AC
  Administered 2014-10-10: 10 meq via INTRAVENOUS
  Filled 2014-10-10: qty 100

## 2014-10-10 NOTE — Discharge Summary (Signed)
Physician Discharge Summary  Travis MALINOSKI Sr. JGO:[id number] DOB: Aug 23, 1932 DOA: 10/06/2014  PCP: [name], MD  Admit date: 10/06/2014 Discharge date: 10/11/2014  Recommendations for Outpatient Follow-up:  1. Pt will need to follow up with PCP in 2 weeks post discharge 2. Maintain 2-3 L nasal cannula and wean for oxygen saturation greater than 92%.  Discharge Diagnoses:  Acute on chronic respiratory failure -Secondary to pneumonia, suspect aspiration/HCAP -Blood cultures negative to date -Lactic acid 1.43 -Procalcitonin 0.28-->0.26-->0.22 -Presently stable on 2 L nasal cannula with oxygen saturation 95-99% percent HCAP/Aspiration pneumonia -speech therapy eval--dysphagia 2 with thin liquids -Antibiotics as discussed above -Pulmonary hygiene -Discontinue vancomycin -continue Zosyn had 4 days during hospitalization-->home with levofloxacin 750 mg on 08/22 and 08/24 which will complete 7 days therapy. Chronic diastolic CHF -The patient does not appear to be fluid overloaded clinically -Patient was discharged with torsemide 80 mg twice a day from his last admission in August 2016 -03/11/2014 echocardiogram EF 55-60% -daily weights--discharge weight 219 pounds Goals of Care -palliative medicine consult appreciated -Patient's daughters initially in agreement that they would like to pursue placement at [facility] as they feel this would be in line with their father's wishes. -10/09/2014--patient's daughter [name] (HPOA)--having second thoughts about transfer to residential hospice--she refused transfer today and wants to continue aggressive care -case discussed with Dr. Domingo Cocking 08/21 -10/10/14--continued discord within family regarding dispo--[name] wants rehab, rest of family ok with hospice -10/11/14--the patient's family ultimately decided to take the patient home to live with his son with hospice care services -There continued to be lots of angst regarding stopping many of the  patient's meds despite many discussions with the patient's daughter [name]--As a result, many of his meds will be restarted after d/c  CKD stage IV -Serum creatinine 3.85 at the time of discharge 09/23/2014 -Monitor renal function off diuretics -Serum creatinine 2.05 at the time of discharge Paroxysmal atrial fibrillation with type 2 mobitiz AV block -10/07/14 AM, the patient appears to be in Mobitz type II AV block -Appreciate cardiology -Not a good candidate for PPM per cardiology -Optimize electrolytes -Keep Potassium 4.0, Mg >2.0 -d/c amiodarone per cardiology, avoid AV nodal blocking agents -continue ASA Diabetes mellitus type 2 -Hold Levemir due to poor oral intake and CBGs in 70-80 range -will not restart Levemir as the patient's CBGs only arranged in 80-1 20s even without Levemir. -08/04/2014 hemoglobin A1c 8.2 -d/c CBGs for now and d/c ISS if focus of care more toward comfort Peripheral vascular disease -Status post right AKA 09/13/2014 --Dr. [name] Given  -discussed with Dr. Harrie Foreman to remove staples from stump--removed on 10/09/2014 -Right stump looks clean without any signs of infection  Hypertension  -D/C amlodipine, hydralazine and Imdur as BP remains on soft side initially -imdur has been restarted Hypothyroidism  -Continue Synthroid  Dyslipidemia  -Continue statin  Sclerotic lesion on ribs on CXR -patient has sclerotic lesions on the ribs on CXR, -Dr [name] is aware of this finding as per his last note in epic. He will follow the patient as outpatient.  Myelodysplastic syndrome-hx of transfusions/ Anemia of chronic disease -Hb stable -Give the patient's scheduled dose of Aranesp 10/07/14  Discharge Condition: stable  Disposition: home with hospice  Diet:dysphagia 2 with thin liquids Wt Readings from Last 3 Encounters:  10/11/14 99.7 kg (219 lb 12.8 oz)  10/04/14 102.059 kg (225 lb)  09/30/14 101.56 kg (223 lb 14.4 oz)    History of present illness:    81 year old male with a history of CKD  stage IV, diastolic CHF, myelodysplastic syndrome, diabetes mellitus, atrial fibrillation, peripheral vascular disease, and coronary artery disease presented with increasing shortness of breath. The patient was recently discharged from the hospital on 09/23/2014 after a prolonged hospital stay during which he had a right above-the-knee amputation secondary to nonhealing leg wounds as well as CHF exacerbation. The patient was discharged with torsemide 80 mg twice a day. During that hospital stay, the patient was seen by palliative medicine. There continues to be incongruent desires within the patient's family regarding his level of care and goals of care. He was also seen by nephrology who did not feel that he was a good dialysis candidate. During his last hospital stay, the patient was also treated for pneumonia with 10 days of vancomycin and Zosyn. The patient was on his way to [facility], and he was diverted to [facility] ED due to worsening shortness of breath. Upon presentation to the emergency department, the patient was noted to be on 6 L nasal cannula and chest x-ray showed multifocal opacities bilaterally. Over the past 2 weeks, the patient has required increasing oxygen demand. He was discharged on 2 L.   Initially, the patient was quite obtunded at the time of admission. The patient was treated with antibiotics for his aspiration pneumonia/HCAP. the patient was also hyponatremic and volume depleted. He was started on intravenous fluids. Palliative medicine was consulted. Dr. Domingo Cocking had multiple discussions with the patient's family. Initially, it appeared that the patient's  HPOA, [name], was agreeable for residential hospice.  As the patient's clinical status improved, [name], changed her mind again similar to the patient's last hospital admission. There continued to be disagreement within the patient's family regarding his ultimate disposition of hospice versus  skilled nursing facility.  After numerous discussions, the patient's family ultimately decided to take the patient home to his son's house with hospice care.  Consultants: Palliative medicine  Discharge Exam: Filed Vitals:   10/11/14 0606  BP: 136/67  Pulse: 56  Temp: 98.6 F (37 C)  Resp:    Filed Vitals:   10/10/14 1500 10/10/14 2158 10/11/14 0433 10/11/14 0606  BP: 150/57 123/49 156/68 136/67  Pulse: 60 54 63 56  Temp: 97.8 F (36.6 C) 98.8 F (37.1 C) 98.8 F (37.1 C) 98.6 F (37 C)  TempSrc: Oral Oral Oral Oral  Resp: 18 19 22    Height:      Weight:    99.7 kg (219 lb 12.8 oz)  SpO2: 98% 100% 98% 99%   General: Alert and awake, NAD, pleasant, cooperative Cardiovascular: RRR, no rub, no gallop, no S3 Respiratory: Bibasilar crackles, left greater than right. No wheezing. Abdomen:soft, nontender, nondistended, positive bowel sounds Extremities: No edema, No lymphangitis, no petechiae; right stump without any drainage, erythema.  Discharge Instructions      Discharge Instructions    Diet - low sodium heart healthy    Complete by:  As directed      Increase activity slowly    Complete by:  As directed             Medication List    STOP taking these medications        allopurinol 100 MG tablet  Commonly known as:  ZYLOPRIM     amiodarone 200 MG tablet  Commonly known as:  PACERONE     amLODipine 10 MG tablet  Commonly known as:  NORVASC     hydrALAZINE 100 MG tablet  Commonly known as:  APRESOLINE  insulin detemir 100 UNIT/ML injection  Commonly known as:  LEVEMIR     omeprazole 20 MG capsule  Commonly known as:  PRILOSEC     oxyCODONE 5 MG immediate release tablet  Commonly known as:  Oxy IR/ROXICODONE     saccharomyces boulardii 250 MG capsule  Commonly known as:  FLORASTOR     sennosides-docusate sodium 8.6-50 MG tablet  Commonly known as:  SENOKOT-S     sodium polystyrene 15 GM/60ML suspension  Commonly known as:  KAYEXALATE      TRIGELS-F FORTE 460-60-0.01-1 MG Caps capsule  Generic drug:  Fe Fum-Vit C-Vit B12-FA      TAKE these medications        acetaminophen 500 MG tablet  Commonly known as:  TYLENOL  Take 500 mg by mouth every 6 (six) hours as needed for mild pain or moderate pain.     albuterol 108 (90 BASE) MCG/ACT inhaler  Commonly known as:  PROVENTIL HFA;VENTOLIN HFA  Inhale 1-2 puffs into the lungs every 6 (six) hours as needed for wheezing or shortness of breath.     albuterol (2.5 MG/3ML) 0.083% nebulizer solution  Commonly known as:  PROVENTIL  Take 3 mLs (2.5 mg total) by nebulization every 4 (four) hours as needed for wheezing or shortness of breath.     ALPRAZolam 0.25 MG tablet  Commonly known as:  XANAX  Take 1 tablet (0.25 mg total) by mouth 3 (three) times daily as needed for anxiety.     aspirin 81 MG EC tablet  Take 1 tablet (81 mg total) by mouth daily.     atorvastatin 40 MG tablet  Commonly known as:  LIPITOR  TAKE 1 TABLET BY MOUTH EVERY DAY     Fluticasone-Salmeterol 100-50 MCG/DOSE Aepb  Commonly known as:  ADVAIR  Inhale 1 puff into the lungs as needed (for shortness of breath).     furosemide 20 MG tablet  Commonly known as:  LASIX  Take 1 tablet (20 mg total) by mouth daily.     isosorbide mononitrate 60 MG 24 hr tablet  Commonly known as:  IMDUR  TAKE 1 TABLET (60 MG TOTAL) BY MOUTH DAILY.     levofloxacin 750 MG tablet  Commonly known as:  LEVAQUIN  Take 1 tablet (750 mg total) by mouth every other day. Give on 10/11/14 and 10/13/14     levothyroxine 125 MCG tablet  Commonly known as:  SYNTHROID, LEVOTHROID  Take 125 mcg by mouth every morning.     oxyCODONE-acetaminophen 5-325 MG per tablet  Commonly known as:  PERCOCET/ROXICET  Take 1 tablet by mouth every 6 (six) hours as needed for moderate pain or severe pain.     OXYGEN  Place 3 L into the nose.         The results of significant diagnostics from this hospitalization (including imaging,  microbiology, ancillary and laboratory) are listed below for reference.    Significant Diagnostic Studies: US Renal  09/17/2014   CLINICAL DATA:  Acute renal insufficiency  EXAM: RENAL / URINARY TRACT ULTRASOUND COMPLETE  COMPARISON:  None.  FINDINGS: Right Kidney:  Length: 10.2 cm. Echogenicity within normal limits. No mass or hydronephrosis visualized.  Left Kidney:  Length: 10.5 cm.  Limited views, negative for hydronephrosis.  Bladder:  Not seen.  IMPRESSION: Negative for hydronephrosis.  Limited views of the left kidney.   Electronically Signed   By: Andreas Newport M.D.   On: 09/17/2014 02:44   Dg Chest Portable 1 View  10/06/2014   CLINICAL DATA:  Shortness of breath and wheezing.  Possible fever.  EXAM: PORTABLE CHEST - 1 VIEW  COMPARISON:  Single view of the chest 09/18/2014 and 09/15/2014.  FINDINGS: Extensive bilateral airspace disease seen on the most recent examination persists and appears somewhat worsened in the left mid lung zone. Aeration in the right chest appears mildly improved. There is cardiomegaly. No pneumothorax or pleural effusion is identified.  IMPRESSION: Multifocal airspace disease has an appearance most compatible with pneumonia. There is increased airspace opacity in the left mid lung zone. Aeration in the right chest appears somewhat improved.   Electronically Signed   By: Inge Rise M.D.   On: 10/06/2014 16:22   Dg Chest Port 1 View  09/18/2014   CLINICAL DATA:  Leukocytosis  EXAM: PORTABLE CHEST - 1 VIEW  COMPARISON:  09/15/2014  FINDINGS: Multifocal patchy opacities in the right upper and lower lobes and left prior hilar region, suspicious for multifocal pneumonia, less likely interstitial edema. No definite pleural effusions. No pneumothorax.  The heart is top-normal in size.  IMPRESSION: Suspected multifocal pneumonia in the bilateral upper lobes and right lower lobe, grossly unchanged.   Electronically Signed   By: Julian Hy M.D.   On: 09/18/2014 08:46    Dg Chest Port 1 View  09/15/2014   CLINICAL DATA:  Fever and difficulty breathing  EXAM: PORTABLE CHEST - 1 VIEW  COMPARISON:  September 09, 2014  FINDINGS: There is generalized interstitial edema. There remains slight alveolar consolidation in the mid right lung, less than on recent prior study. Heart is enlarged. The pulmonary vascular is within normal limits. No adenopathy.  IMPRESSION: Findings consistent with congestive heart failure. A small focus of pneumonia in the right mid lung cannot be excluded.   Electronically Signed   By: Lowella Grip III M.D.   On: 09/15/2014 16:53     Microbiology: Recent Results (from the past 240 hour(s))  Blood culture (routine x 2)     Status: None (Preliminary result)   Collection Time: 10/06/14  5:45 PM  Result Value Ref Range Status   Specimen Description BLOOD RIGHT ARM  Final   Special Requests BOTTLES DRAWN AEROBIC AND ANAEROBIC 5ML  Final   Culture NO GROWTH 4 DAYS  Final   Report Status PENDING  Incomplete  Blood culture (routine x 2)     Status: None (Preliminary result)   Collection Time: 10/06/14  6:00 PM  Result Value Ref Range Status   Specimen Description BLOOD RIGHT HAND  Final   Special Requests BOTTLES DRAWN AEROBIC AND ANAEROBIC 5ML  Final   Culture NO GROWTH 4 DAYS  Final   Report Status PENDING  Incomplete  MRSA PCR Screening     Status: None   Collection Time: 10/06/14 11:30 PM  Result Value Ref Range Status   MRSA by PCR NEGATIVE NEGATIVE Final    Comment:        The GeneXpert MRSA Assay (FDA approved for NASAL specimens only), is one component of a comprehensive MRSA colonization surveillance program. It is not intended to diagnose MRSA infection nor to guide or monitor treatment for MRSA infections.   C difficile quick scan w PCR reflex     Status: Abnormal   Collection Time: 10/10/14  2:45 AM  Result Value Ref Range Status   C Diff antigen POSITIVE (A) NEGATIVE Final   C Diff toxin NEGATIVE NEGATIVE Final   C  Diff interpretation   Final  C. difficile present, but toxin not detected. This indicates colonization. In most cases, this does not require treatment. If patient has signs and symptoms consistent with colitis, consider treatment.     Labs: Basic Metabolic Panel:  Recent Labs Lab 10/06/14 1520 10/07/14 0308 10/08/14 0258 10/10/14 0439  NA 144 147* 149* 141  K 4.6 4.0 3.9 3.6  CL 115* 117* 116* 112*  CO2 19* 21* 23 21*  GLUCOSE 108* 83 79 127*  BUN 71* 64* 48* 34*  CREATININE 2.81* 2.65* 2.62* 2.05*  CALCIUM 8.6* 8.6* 8.3* 7.9*  MG  --   --   --  1.7   Liver Function Tests: No results for input(s): AST, ALT, ALKPHOS, BILITOT, PROT, ALBUMIN in the last 168 hours. No results for input(s): LIPASE, AMYLASE in the last 168 hours. No results for input(s): AMMONIA in the last 168 hours. CBC:  Recent Labs Lab 10/06/14 1520 10/07/14 0308 10/08/14 0258 10/10/14 0439  WBC 9.3 8.3 7.0 4.8  NEUTROABS 8.0*  --   --   --   HGB 8.6* 8.5* 8.6* 9.0*  HCT 29.7* 29.4* 30.5* 31.8*  MCV 90.5 90.7 92.1 90.9  PLT 363 339 327 287   Cardiac Enzymes: No results for input(s): CKTOTAL, CKMB, CKMBINDEX, TROPONINI in the last 168 hours. BNP: Invalid input(s): POCBNP CBG:  Recent Labs Lab 10/07/14 1520 10/07/14 2203 10/08/14 0843 10/08/14 1223 10/08/14 1626  GLUCAP 73 72 81 88 110*    Time coordinating discharge:  Greater than 30 minutes  Signed:  Mckinlee Dunk, DO [facility] Pager: [phone] 10/11/2014, 8:33 AM

## 2014-10-10 NOTE — Progress Notes (Signed)
I stopped to see [name] and he reports he is feeling really well today.  No family was present, and I was unable to reach his daughter, [name].  Spoke with Dr. Carles Collet and as well as hospice liaison who both report the family is now working on taking patient home with hospice support as soon as tomorrow.  Please let us know if there is anything else we can do to assist in the care of [name] at this time.  Micheline Rough, MD [place] [facility] [phone]

## 2014-10-10 NOTE — Progress Notes (Signed)
PROGRESS NOTE  Travis MULA Sr. JKD:[id number] DOB: 1933-01-02 DOA: 10/06/2014 PCP: [name], MD Brief history 81 year old male with a history of CKD stage IV, diastolic CHF, myelodysplastic syndrome, diabetes mellitus, atrial fibrillation, peripheral vascular disease, and coronary artery disease presented with increasing shortness of breath. The patient was recently discharged from the hospital on 09/23/2014 after a prolonged hospital stay during which he had a right above-the-knee amputation secondary to nonhealing leg wounds as well as CHF exacerbation. The patient was discharged with torsemide 80 mg twice a day. During that hospital stay, the patient was seen by palliative medicine. There continues to be incongruent desires within the patient's family regarding his level of care and goals of care. He was also seen by nephrology who did not feel that he was a good dialysis candidate. During his last hospital stay, the patient was also treated for pneumonia with 10 days of vancomycin and Zosyn. The patient was on his way to [facility] [place] center, and he was diverted to [facility] ED due to worsening shortness of breath. Upon presentation to the emergency department, the patient was noted to be on 6 L nasal cannula and chest x-ray showed multifocal opacities bilaterally. Over the past 2 weeks, the patient has required increasing oxygen demand. He was discharged on 2 L.  Assessment/Plan: Acute on chronic respiratory failure -Secondary to pneumonia, suspect aspiration/HCAP -Blood cultures negative to date -Lactic acid 1.43 -Procalcitonin 0.28-->0.26 -Presently stable on 2 L nasal cannula with oxygen saturation 95-99% percent HCAP/Aspiration pneumonia -speech therapy eval--dysphagia 2 with thin liquids -Antibiotics as discussed above -Pulmonary hygiene -Discontinue vancomycin,  -continue Zosyn D#4 Chronic diastolic CHF -The patient does not appear to be fluid overloaded clinically -Patient was  discharged with torsemide 80 mg twice a day from his last admission in August 2016 -03/11/2014 echocardiogram EF 55-60% -daily weights Goals of Care -palliative medicine consult appreciated -Patient's daughters initially in agreement that they would like to pursue placement at [facility] as they feel this would be in line with their father's wishes. -10/09/2014--patient's daughter [name] (HPOA)--having second thoughts about transfer to residential hospice--she refused transfer today and wants to continue aggressive care -case discussed with Dr. Domingo Cocking -10/10/14--continued discord within family regarding dispo--[name] wants rehab, rest of family ok with hospice CKD stage IV -Serum creatinine 3.85 at the time of discharge 09/23/2014 -Monitor renal function off diuretics Paroxysmal atrial fibrillation with type 2 mobitiz AV block -10/07/14 AM, the patient appears to be in Mobitz type II AV block -Appreciate cardiology -Not a good candidate for PPM -Optimize electrolytes -Potassium 4.0 -d/c amiodarone per cardiology, avoid AV nodal blocking agents -continue ASA Diabetes mellitus type 2 -Hold Levemir due to poor oral intake and CBGs in 70-80 range -08/04/2014 hemoglobin A1c 8.2 -d/c CBGs for now and d/c ISS if focus of care more toward comfort Peripheral vascular disease -Status post right AKA 09/13/2014 --Dr. [name] Given  -discussed with Dr. Harrie Foreman to remove staples from stump -Right stump looks clean without any signs of infection  Hypertension  -D/C amlodipine, hydralazine and Imdur as BP remains on soft side Hypothyroidism  -Continue Synthroid  Dyslipidemia  -Continue statin  Sclerotic lesion on ribs on CXR -patient has sclerotic lesions on the ribs on CXR, -Dr [name] is aware of this finding as per his last note in epic. He will follow the patient as outpatient.  Myelodysplastic syndrome-hx of transfusions/ Anemia of chronic disease -Hb stable -Give the patient's scheduled  dose of  Aranesp 10/07/14   Family Communication: 8/21-updated daughter [name]--total time 35 min. >50% spent counseling and coordinating care Disposition Plan: residential hospice when family agreeable     Procedures/Studies: US Renal  09/17/2014   CLINICAL DATA:  Acute renal insufficiency  EXAM: RENAL / URINARY TRACT ULTRASOUND COMPLETE  COMPARISON:  None.  FINDINGS: Right Kidney:  Length: 10.2 cm. Echogenicity within normal limits. No mass or hydronephrosis visualized.  Left Kidney:  Length: 10.5 cm.  Limited views, negative for hydronephrosis.  Bladder:  Not seen.  IMPRESSION: Negative for hydronephrosis.  Limited views of the left kidney.   Electronically Signed   By: Andreas Newport M.D.   On: 09/17/2014 02:44   Dg Chest Portable 1 View  10/06/2014   CLINICAL DATA:  Shortness of breath and wheezing.  Possible fever.  EXAM: PORTABLE CHEST - 1 VIEW  COMPARISON:  Single view of the chest 09/18/2014 and 09/15/2014.  FINDINGS: Extensive bilateral airspace disease seen on the most recent examination persists and appears somewhat worsened in the left mid lung zone. Aeration in the right chest appears mildly improved. There is cardiomegaly. No pneumothorax or pleural effusion is identified.  IMPRESSION: Multifocal airspace disease has an appearance most compatible with pneumonia. There is increased airspace opacity in the left mid lung zone. Aeration in the right chest appears somewhat improved.   Electronically Signed   By: Inge Rise M.D.   On: 10/06/2014 16:22   Dg Chest Port 1 View  09/18/2014   CLINICAL DATA:  Leukocytosis  EXAM: PORTABLE CHEST - 1 VIEW  COMPARISON:  09/15/2014  FINDINGS: Multifocal patchy opacities in the right upper and lower lobes and left prior hilar region, suspicious for multifocal pneumonia, less likely interstitial edema. No definite pleural effusions. No pneumothorax.  The heart is top-normal in size.  IMPRESSION: Suspected multifocal pneumonia in the bilateral  upper lobes and right lower lobe, grossly unchanged.   Electronically Signed   By: Julian Hy M.D.   On: 09/18/2014 08:46   Dg Chest Port 1 View  09/15/2014   CLINICAL DATA:  Fever and difficulty breathing  EXAM: PORTABLE CHEST - 1 VIEW  COMPARISON:  September 09, 2014  FINDINGS: There is generalized interstitial edema. There remains slight alveolar consolidation in the mid right lung, less than on recent prior study. Heart is enlarged. The pulmonary vascular is within normal limits. No adenopathy.  IMPRESSION: Findings consistent with congestive heart failure. A small focus of pneumonia in the right mid lung cannot be excluded.   Electronically Signed   By: Lowella Grip III M.D.   On: 09/15/2014 16:53         Subjective: Patient denies fevers, chills, headache, chest pain, dyspnea, nausea, vomiting, diarrhea, abdominal pain, dysuria, hematuria   Objective: Filed Vitals:   10/09/14 2100 10/09/14 2138 10/10/14 0500 10/10/14 0704  BP:  127/48  146/57  Pulse:  66  58  Temp:  98.4 F (36.9 C)  97 F (36.1 C)  TempSrc:  Axillary  Oral  Resp:  18  16  Height:      Weight:   99.1 kg (218 lb 7.6 oz)   SpO2: 100% 100%  100%    Intake/Output Summary (Last 24 hours) at 10/10/14 1713 Last data filed at 10/10/14 0600  Gross per 24 hour  Intake   2190 ml  Output      0 ml  Net   2190 ml   Weight change: 1.5 kg (3 lb 4.9 oz) Exam:  General:  Pt is alert, follows commands appropriately, not in acute distress  HEENT: No icterus, No thrush, No neck mass, [place]/AT  Cardiovascular: RRR, S1/S2, no rubs, no gallops  Respiratory: Bibasilar crackles without wheezing. Good air movement.  Abdomen: Soft/+BS, non tender, non distended, no guarding; no hepatosplenomegaly  Extremities: No edema, No lymphangitis, No petechiae, No rashes, no synovitis; no cyanosis or clubbing  Data Reviewed: Basic Metabolic Panel:  Recent Labs Lab 10/06/14 1520 10/07/14 0308 10/08/14 0258  10/10/14 0439  NA 144 147* 149* 141  K 4.6 4.0 3.9 3.6  CL 115* 117* 116* 112*  CO2 19* 21* 23 21*  GLUCOSE 108* 83 79 127*  BUN 71* 64* 48* 34*  CREATININE 2.81* 2.65* 2.62* 2.05*  CALCIUM 8.6* 8.6* 8.3* 7.9*  MG  --   --   --  1.7   Liver Function Tests: No results for input(s): AST, ALT, ALKPHOS, BILITOT, PROT, ALBUMIN in the last 168 hours. No results for input(s): LIPASE, AMYLASE in the last 168 hours. No results for input(s): AMMONIA in the last 168 hours. CBC:  Recent Labs Lab 10/06/14 1520 10/07/14 0308 10/08/14 0258 10/10/14 0439  WBC 9.3 8.3 7.0 4.8  NEUTROABS 8.0*  --   --   --   HGB 8.6* 8.5* 8.6* 9.0*  HCT 29.7* 29.4* 30.5* 31.8*  MCV 90.5 90.7 92.1 90.9  PLT 363 339 327 287   Cardiac Enzymes: No results for input(s): CKTOTAL, CKMB, CKMBINDEX, TROPONINI in the last 168 hours. BNP: Invalid input(s): POCBNP CBG:  Recent Labs Lab 10/07/14 1520 10/07/14 2203 10/08/14 0843 10/08/14 1223 10/08/14 1626  GLUCAP 73 72 81 88 110*    Recent Results (from the past 240 hour(s))  Blood culture (routine x 2)     Status: None (Preliminary result)   Collection Time: 10/06/14  5:45 PM  Result Value Ref Range Status   Specimen Description BLOOD RIGHT ARM  Final   Special Requests BOTTLES DRAWN AEROBIC AND ANAEROBIC 5ML  Final   Culture NO GROWTH 3 DAYS  Final   Report Status PENDING  Incomplete  Blood culture (routine x 2)     Status: None (Preliminary result)   Collection Time: 10/06/14  6:00 PM  Result Value Ref Range Status   Specimen Description BLOOD RIGHT HAND  Final   Special Requests BOTTLES DRAWN AEROBIC AND ANAEROBIC 5ML  Final   Culture NO GROWTH 3 DAYS  Final   Report Status PENDING  Incomplete  MRSA PCR Screening     Status: None   Collection Time: 10/06/14 11:30 PM  Result Value Ref Range Status   MRSA by PCR NEGATIVE NEGATIVE Final    Comment:        The GeneXpert MRSA Assay (FDA approved for NASAL specimens only), is one component of  a comprehensive MRSA colonization surveillance program. It is not intended to diagnose MRSA infection nor to guide or monitor treatment for MRSA infections.   C difficile quick scan w PCR reflex     Status: Abnormal   Collection Time: 10/10/14  2:45 AM  Result Value Ref Range Status   C Diff antigen POSITIVE (A) NEGATIVE Final   C Diff toxin NEGATIVE NEGATIVE Final   C Diff interpretation   Final    C. difficile present, but toxin not detected. This indicates colonization. In most cases, this does not require treatment. If patient has signs and symptoms consistent with colitis, consider treatment.     Scheduled Meds: . antiseptic oral rinse  7 mL Mouth Rinse BID  . aspirin EC  81 mg Oral Daily  . atorvastatin  40 mg Oral Daily  . heparin  5,000 Units Subcutaneous 3 times per day  . isosorbide mononitrate  30 mg Oral Daily  . levothyroxine  125 mcg Oral QAC breakfast  . mometasone-formoterol  2 puff Inhalation BID  . mupirocin cream   Topical Daily  . piperacillin-tazobactam (ZOSYN)  IV  3.375 g Intravenous Q8H   Continuous Infusions: . dextrose 75 mL/hr at 10/10/14 1536     Homar Weinkauf, DO  [facility] Pager [phone]  If 7PM-7AM, please contact night-coverage [email or web address] Password [email or web address] 10/10/2014, 5:13 PM   LOS: 4 days

## 2014-10-11 LAB — CULTURE, BLOOD (ROUTINE X 2)
Culture: NO GROWTH
Culture: NO GROWTH

## 2014-10-11 LAB — PROCALCITONIN: PROCALCITONIN: 0.22 ng/mL

## 2014-10-11 MED ORDER — LEVOFLOXACIN 750 MG PO TABS: 750.0000 mg | ORAL_TABLET | ORAL | Status: AC

## 2014-10-11 MED ORDER — FUROSEMIDE 20 MG PO TABS
20.0000 mg | ORAL_TABLET | Freq: Every day | ORAL | Status: AC
Start: 2014-10-11 — End: ?

## 2014-10-11 MED ORDER — OXYCODONE-ACETAMINOPHEN 5-325 MG PO TABS: 1.0000 | ORAL_TABLET | Freq: Four times a day (QID) | ORAL | Status: DC | PRN

## 2014-10-11 MED ORDER — OXYCODONE-ACETAMINOPHEN 5-325 MG PO TABS
2.0000 | ORAL_TABLET | Freq: Once | ORAL | Status: AC
  Administered 2014-10-11: 2 via ORAL
  Filled 2014-10-11: qty 2

## 2014-10-11 MED ORDER — OXYCODONE-ACETAMINOPHEN 5-325 MG PO TABS: 1.0000 | ORAL_TABLET | Freq: Four times a day (QID) | ORAL | Status: AC | PRN

## 2014-10-11 NOTE — Care Management Note (Signed)
Case Management Note  Patient Details  Name: Travis ELLENBECKER Sr. MRN: [id number] Date of Birth: [birth year]  Subjective/Objective:      Pt for dc home with hospice services today, provided by [facility].   Hospice agency nurse and pt's daughter, [name] met this AM to discuss expectations for care, DME needs, and goals.  Daughter states pt will need ambulance transport home.  He will dc to his son's home at [address], [place], [place] [address].  Hospice RN to make arrangements for all DME with [facility]; she states she will take care of ordering all DME.  Notified MD for needed wound care orders upon dc.                 Action/Plan: CSW consulted for ambulance transport via [facility].  Pt will need O2 during transport, per Hospice nurse.    Expected Discharge Date:       10/11/2014           Expected Discharge Plan:  Home w Hospice Care  In-House Referral:  Clinical Social Work  Discharge planning Services  CM Consult  Post Acute Care Choice:  Hospice Choice offered to:  [facility] POA / Guardian  DME Arranged:   per Hospice RN arrangements  DME Agency:   [facility]  HH Arranged:    [place]  Status of Service:  Completed, signed off  Medicare Important Message Given:  Yes-second notification given Date Medicare IM Given:    Medicare IM give by:    Date Additional Medicare IM Given:    Additional Medicare Important Message give by:     If discussed at Lead of Stay Meetings, dates discussed:    Additional Comments:  Reinaldo Raddle, RN, BSN  Trauma/Neuro ICU Case Manager [phone]

## 2014-10-11 NOTE — Progress Notes (Signed)
Patient will discharge to home Anticipated discharge date: 10/11/14 Transportation by [facility]- scheduled for 3:30pm  CSW signing off.  Domenica Reamer, [place] Social Worker [phone]

## 2014-10-11 NOTE — Progress Notes (Signed)
I stopped by to check on [name]. His daughter [name] was meeting with hospice liaison. She reports they are planning on taking him home later today. She denied any questions for me or any other concerns at this time. I asked her to call and let me know if she has further questions, prior to his discharge.  Micheline Rough, MD [place] Team [phone]

## 2014-10-12 ENCOUNTER — Telehealth: Payer: Self-pay | Admitting: *Deleted

## 2014-10-12 NOTE — Telephone Encounter (Signed)
Called pt/daughter to get him set-up for TCM appt was d/c 10/11/14. [name] stated that dad was d/c home and family decided to do hospice so they will be under [facility] care. Did not make TCM appt with pcp....[name]

## 2014-10-12 NOTE — Progress Notes (Signed)
This encounter was created in error - please disregard.

## 2014-10-15 ENCOUNTER — Inpatient Hospital Stay (HOSPITAL_COMMUNITY): Payer: Self-pay

## 2014-10-15 ENCOUNTER — Telehealth: Payer: Self-pay | Admitting: Internal Medicine

## 2014-10-15 ENCOUNTER — Inpatient Hospital Stay (HOSPITAL_COMMUNITY): Admit: 2014-10-15 | Payer: Self-pay

## 2014-10-15 NOTE — Telephone Encounter (Signed)
Pt daughter states that she will keep pt with Hospice and monitor his condition and call back as needed for DME and [facility] referrals

## 2014-10-15 NOTE — Telephone Encounter (Signed)
Pt daughter called said that pt is doing pretty good she would like to talk to nurse about a few things.  They are thinking of cancelling [facility]osp[facility] and would maybe like to see if they could just get a skilled nurse back in there.  ......  Best number to reach her is her cell number

## 2014-10-21 DEATH — deceased

## 2015-08-05 ENCOUNTER — Other Ambulatory Visit: Payer: Self-pay | Admitting: Nurse Practitioner

## 2015-08-30 ENCOUNTER — Other Ambulatory Visit: Payer: Self-pay

## 2016-08-20 IMAGING — DX DG CHEST 2V
2 series · 2 of 2 positions shown · non-contrast
Comparison: 11/28/2013 and 10/24/2013 and CT scan dated 10/21/2013

CLINICAL DATA: Shortness of breath.

EXAM:
CHEST  2 VIEW

[chest lat]
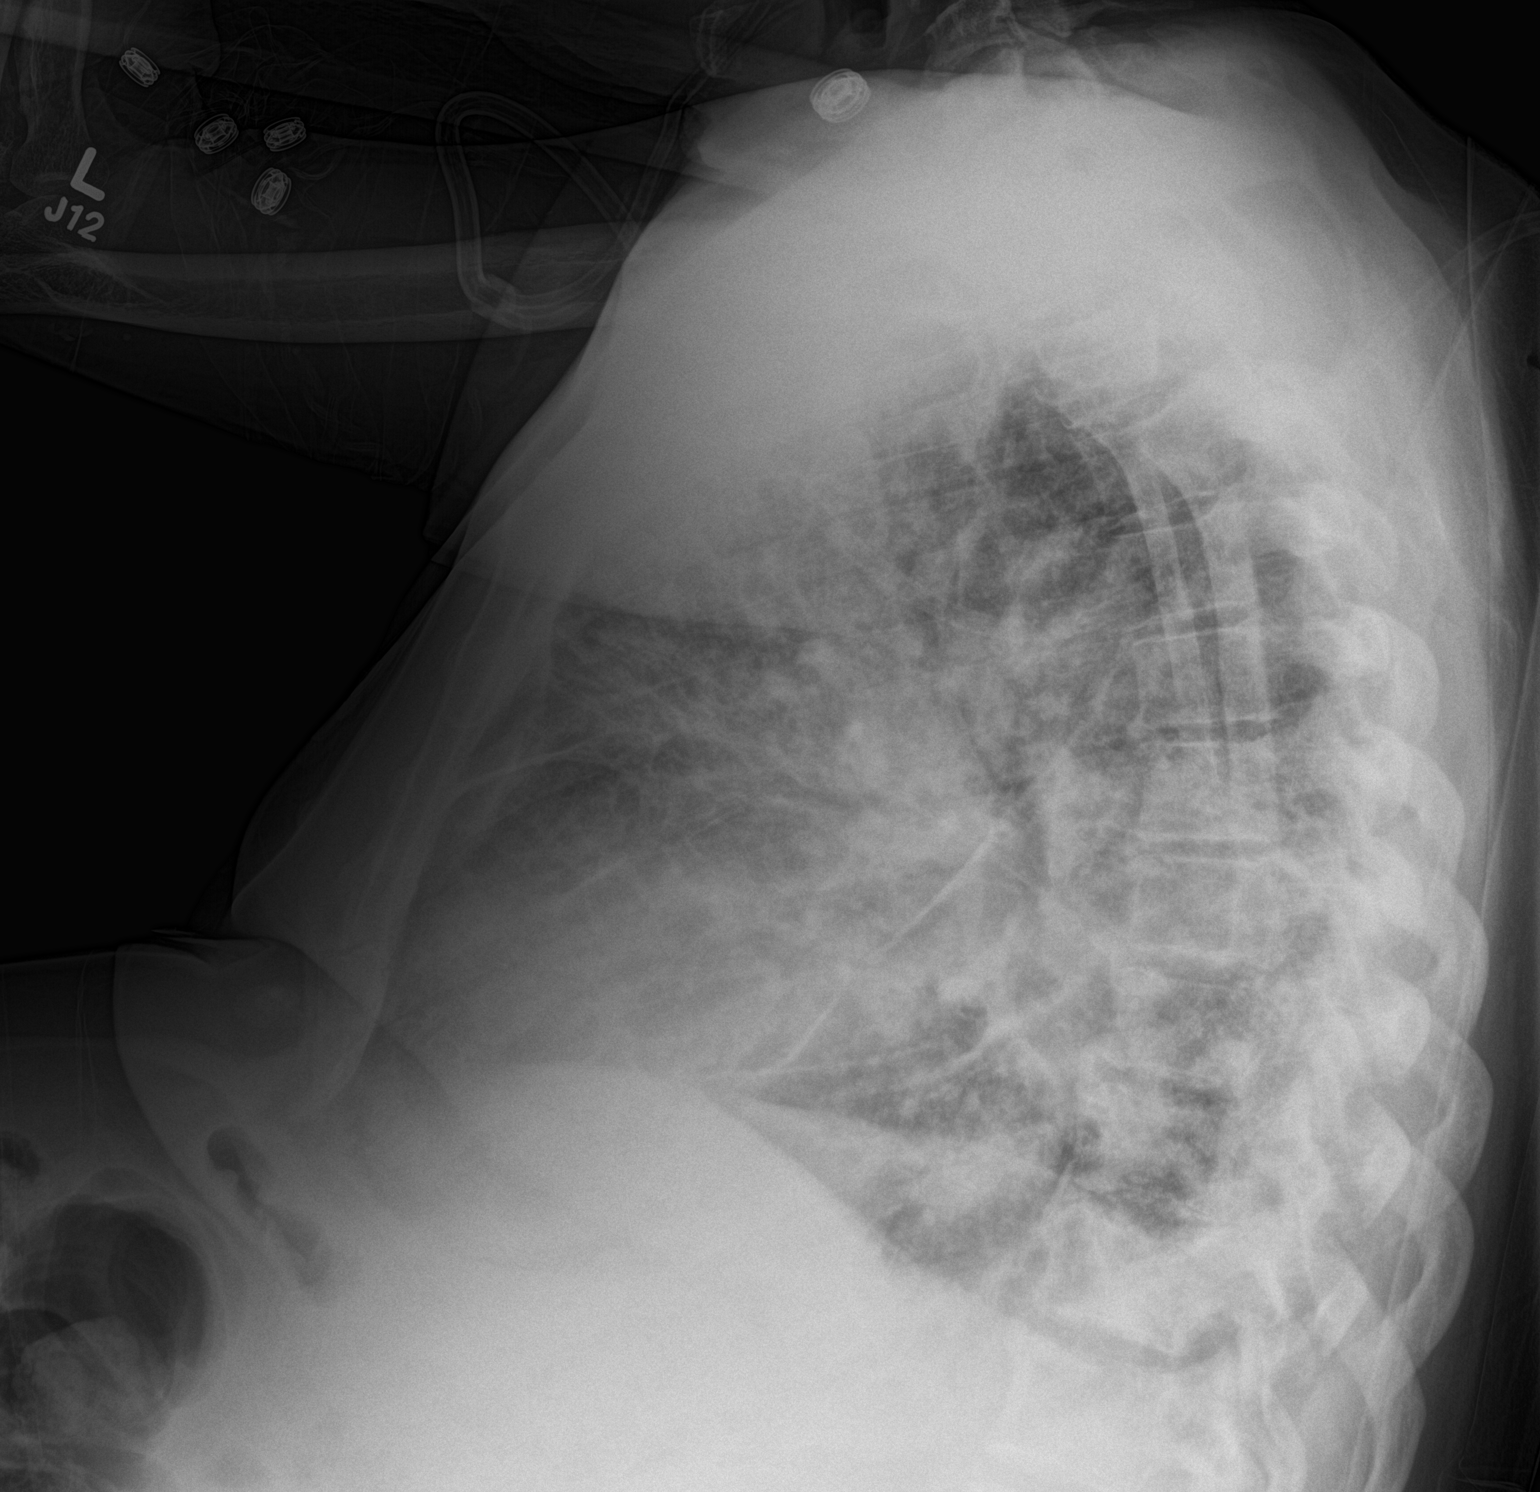

[chest ap]
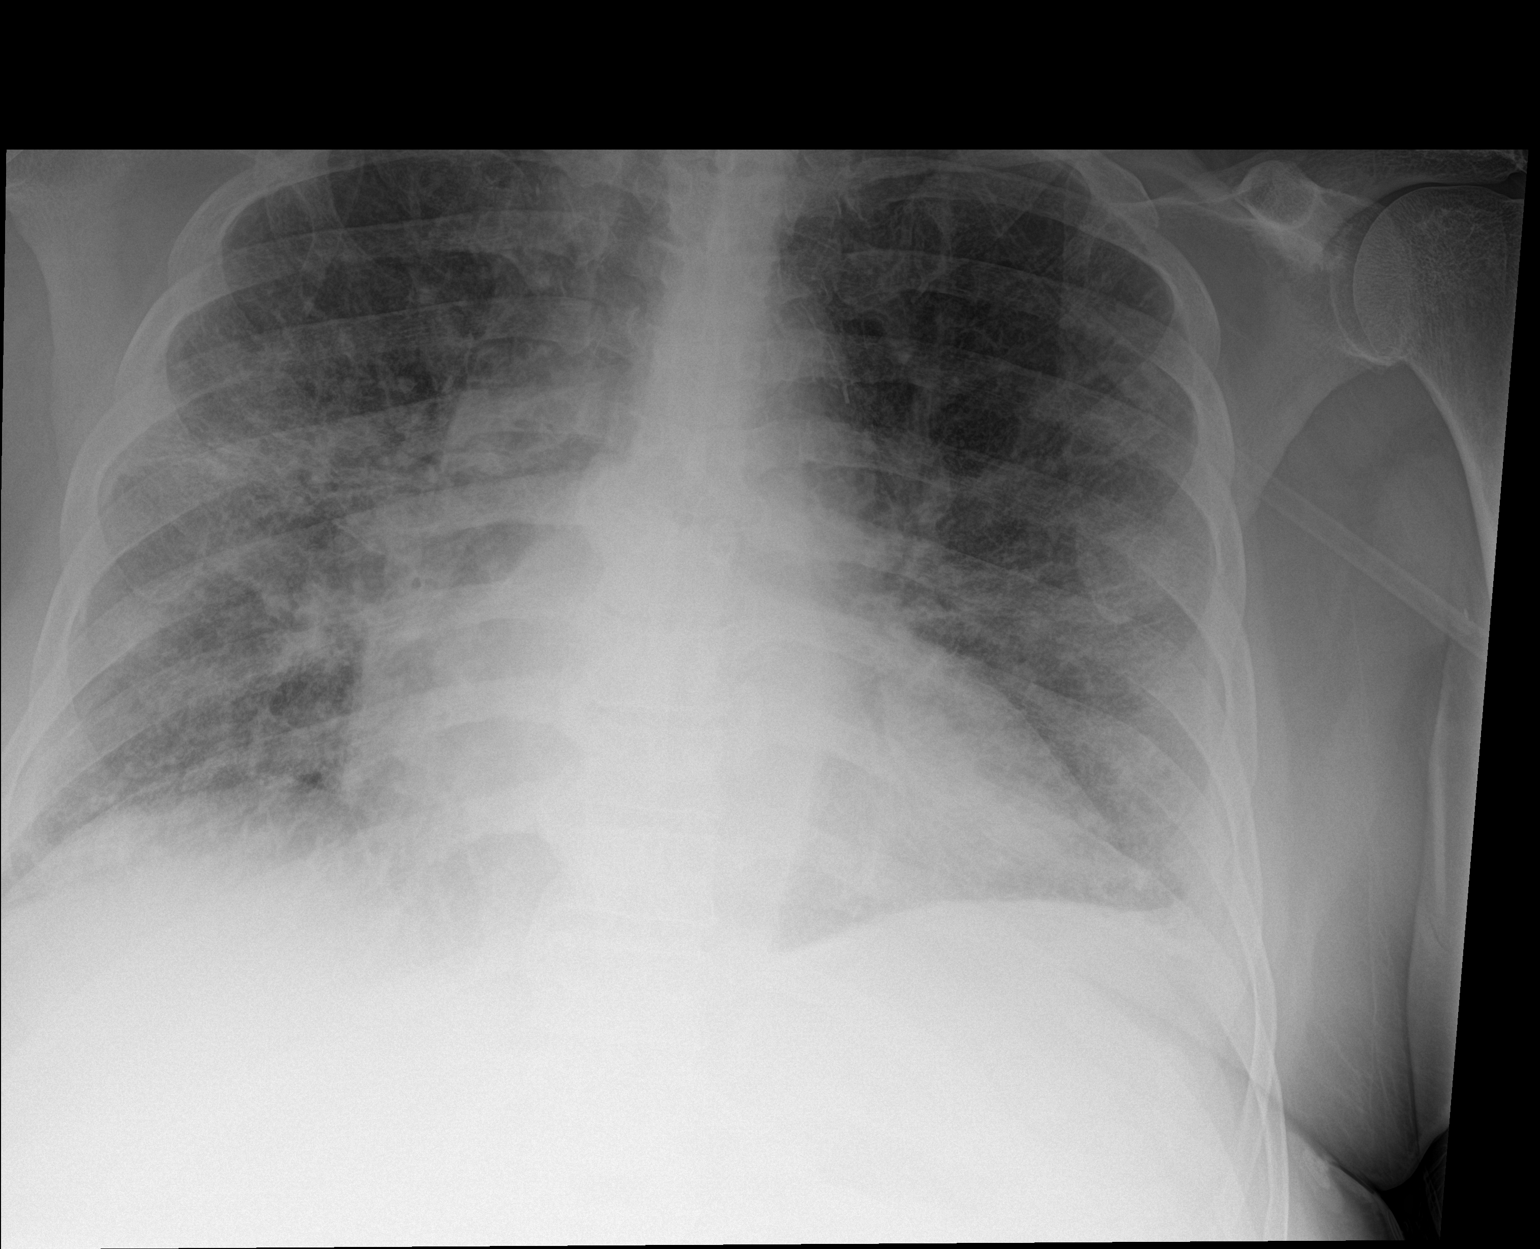

[2 of 2 positions shown; findings below may reference images not displayed]

FINDINGS: The patient has interstitial infiltrates, right greater than left
superimposed on severe chronic lung disease. There are tiny
bilateral pleural effusions. There is peribronchial thickening as
well as thickening of the fissures. The finding is most consistent
with pulmonary edema superimposed on severe chronic lung disease.
IMPRESSION: Probable pulmonary edema superimposed on severe chronic lung
disease.

## 2017-03-30 IMAGING — CR DG CHEST 1V PORT
1 series · 1 of 1 positions shown · non-contrast
Comparison: March 10, 2014

CLINICAL DATA: Lower extremity weakness

EXAM:
PORTABLE CHEST - 1 VIEW

[AP]
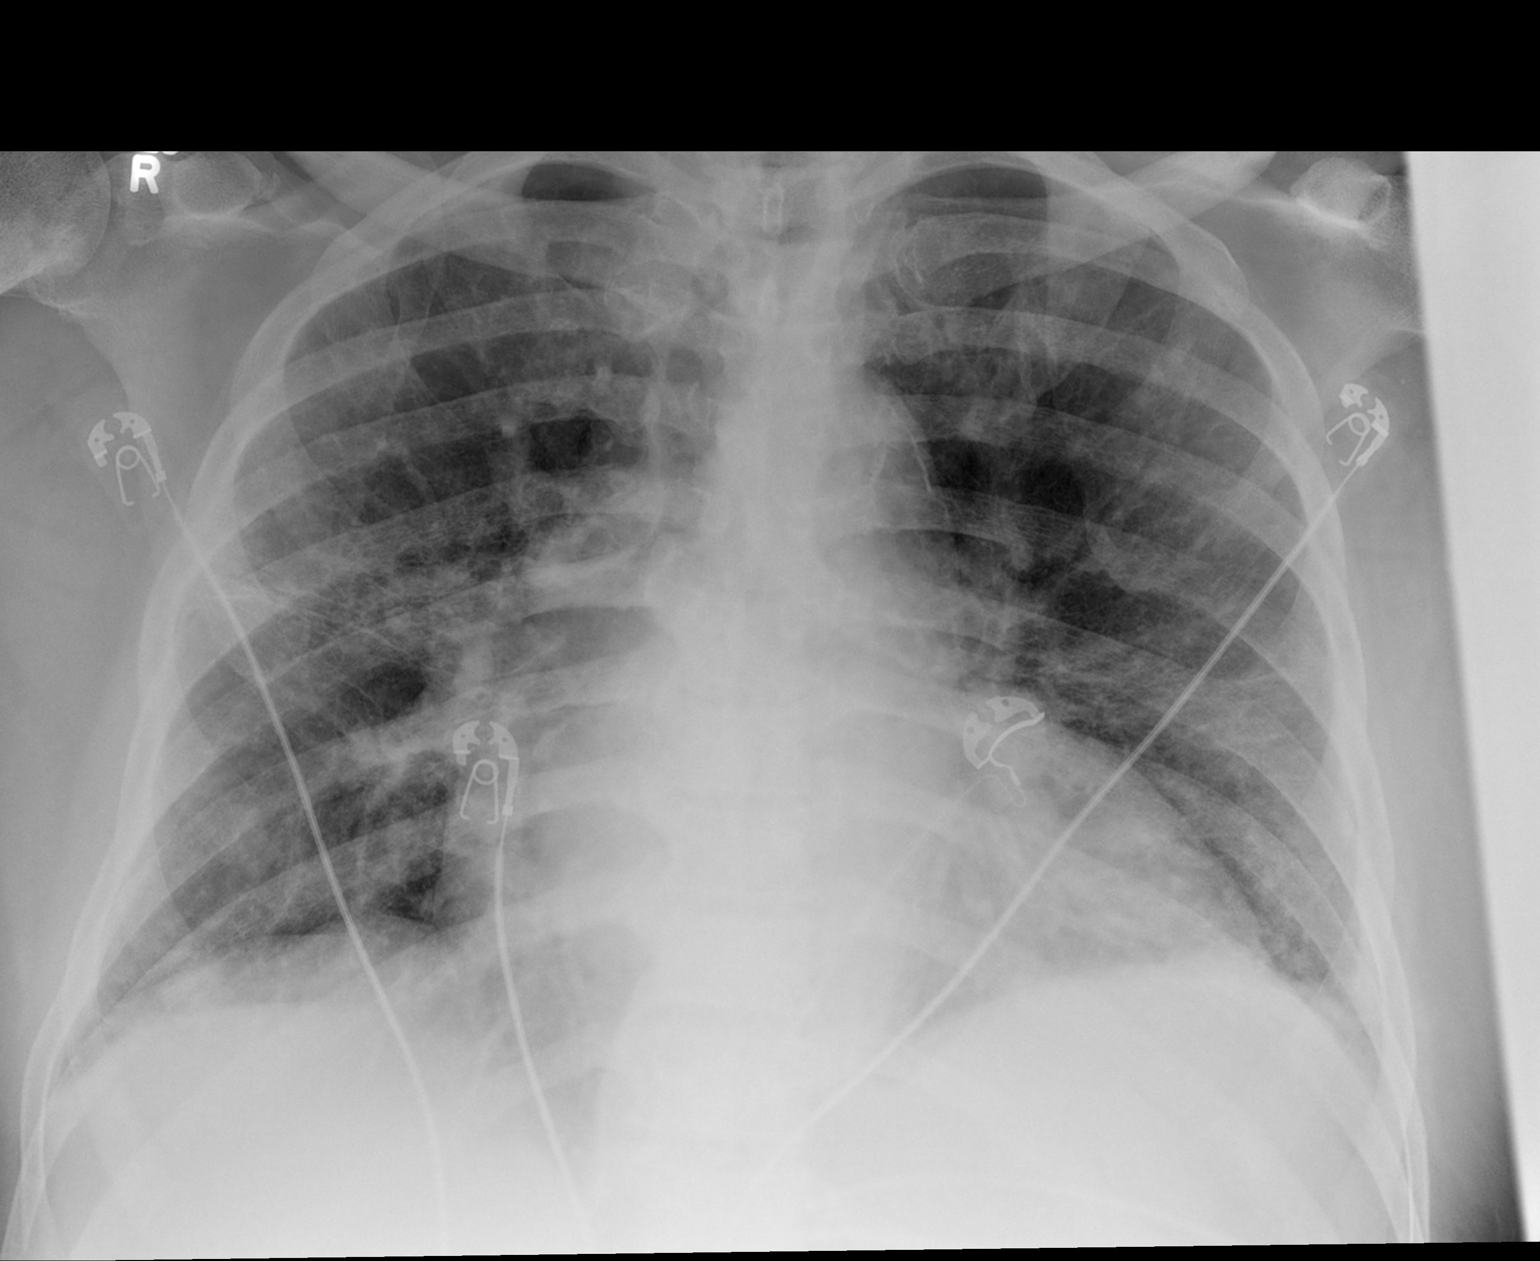

[1 of 1 positions shown; findings below may reference images not displayed]

FINDINGS: There is scarring in both mid lung regions. There is no frank edema
or consolidation. Heart is mildly enlarged with pulmonary vascular
within normal limits. There is atherosclerotic change in aorta. The
bony structures appear stable compared to prior study without focal
lesion. Several bony structures appear rather sclerotic a
particularly in the medial proximal right humerus, incompletely
visualized on this study.
IMPRESSION: Areas of lung scarring. No frank edema or consolidation. Several
bones appear somewhat sclerotic. This finding may warrant prostate
evaluation given the propensity of prostate carcinoma to present
with sclerotic bony metastases.

## 2017-03-31 IMAGING — DX DG CHEST 1V
1 series · 1 of 1 positions shown · non-contrast
Comparison: Portable exam 7376 hours compared to 09/06/2014

CLINICAL DATA: Fluid overload, history CHF, COPD, hypertension,
diabetes mellitus, prostate cancer

EXAM:
CHEST  1 VIEW

[chest ap]
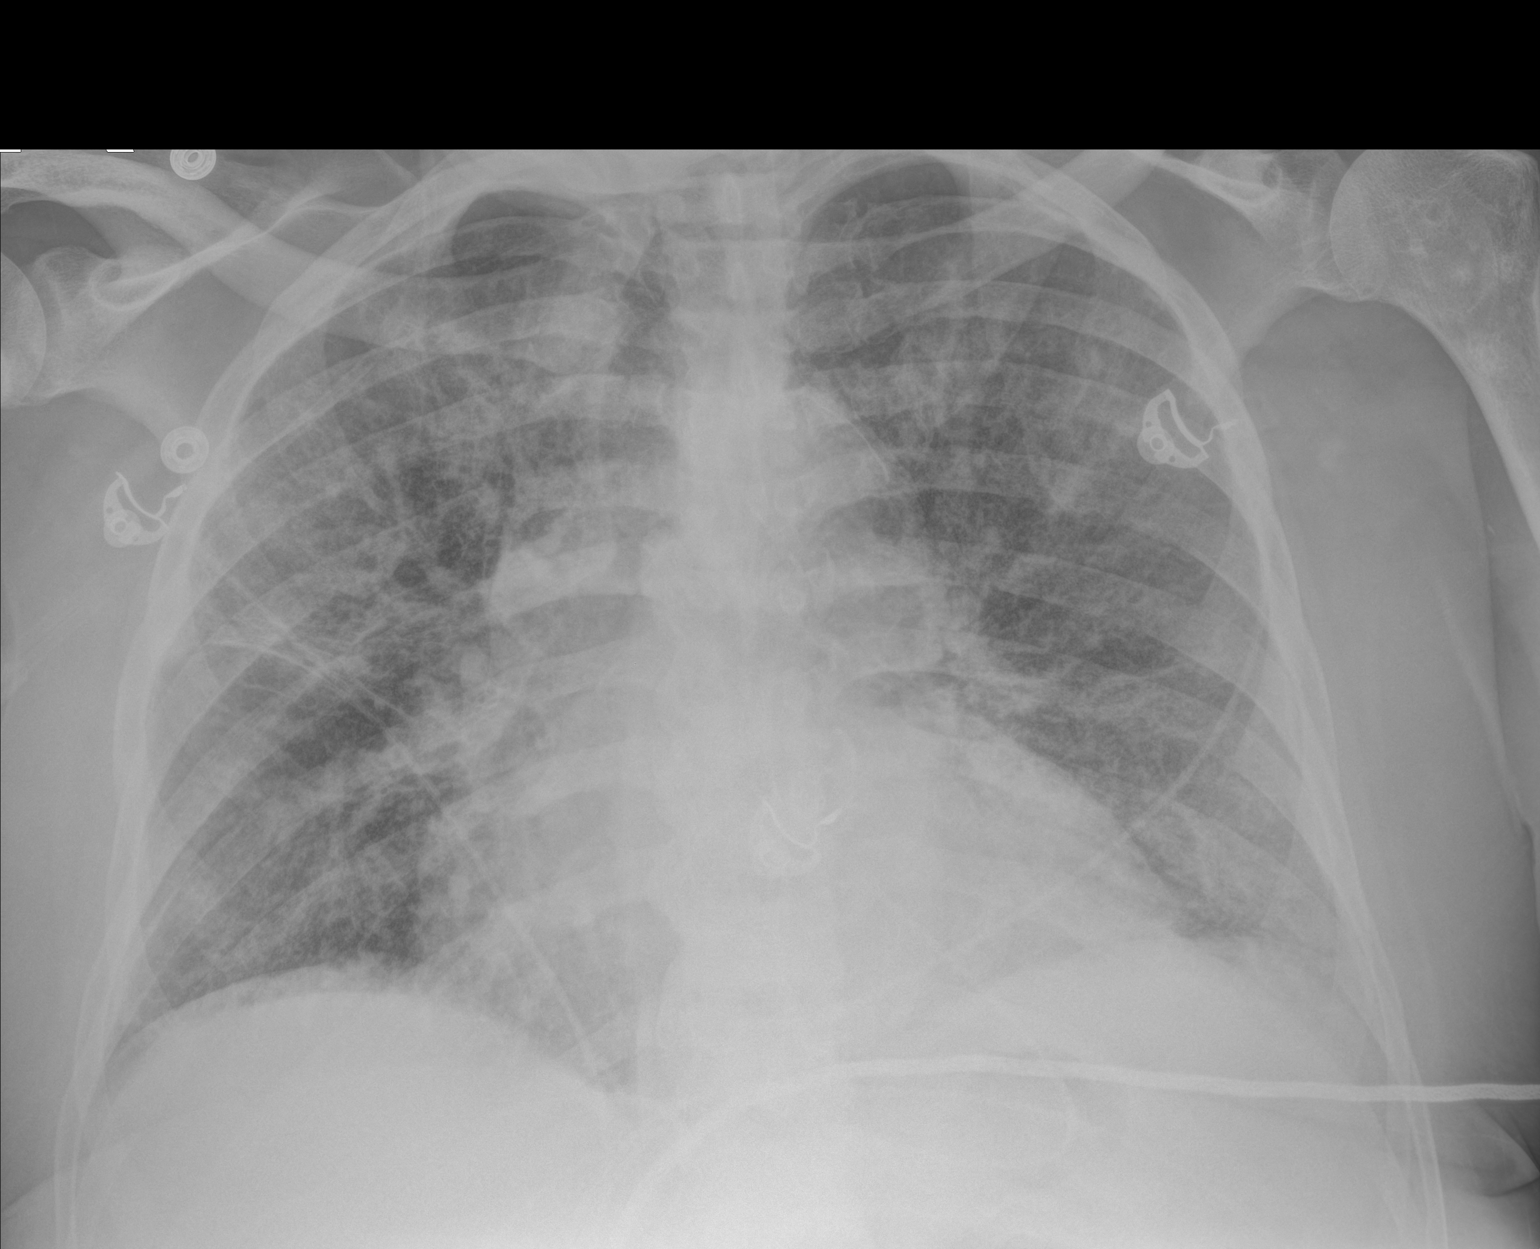

[1 of 1 positions shown; findings below may reference images not displayed]

FINDINGS: Enlargement of cardiac silhouette.

Atherosclerotic calcification aorta.

Mediastinal contour stable.

Diffuse pulmonary infiltrates question edema versus less likely
infection, increased versus previous study.

No pleural effusion or pneumothorax.

Sclerotic foci at the proximal LEFT humerus question related to
metastatic disease
IMPRESSION: Enlargement of cardiac silhouette with diffuse BILATERAL infiltrates
favoring pulmonary edema over infection.

Sclerotic foci at the proximal LEFT humerus cannot exclude sclerotic
osseous metastases in this patient with a history of prostate
cancer.

## 2017-04-02 IMAGING — CR DG CHEST 2V
2 series · 2 of 2 positions shown · non-contrast
Comparison: Prior chest x-ray 09/07/2014

CLINICAL DATA: 82-year-old male with fever, cough and congestion

EXAM:
CHEST  2 VIEW

[w chest lat (1 of 2)]
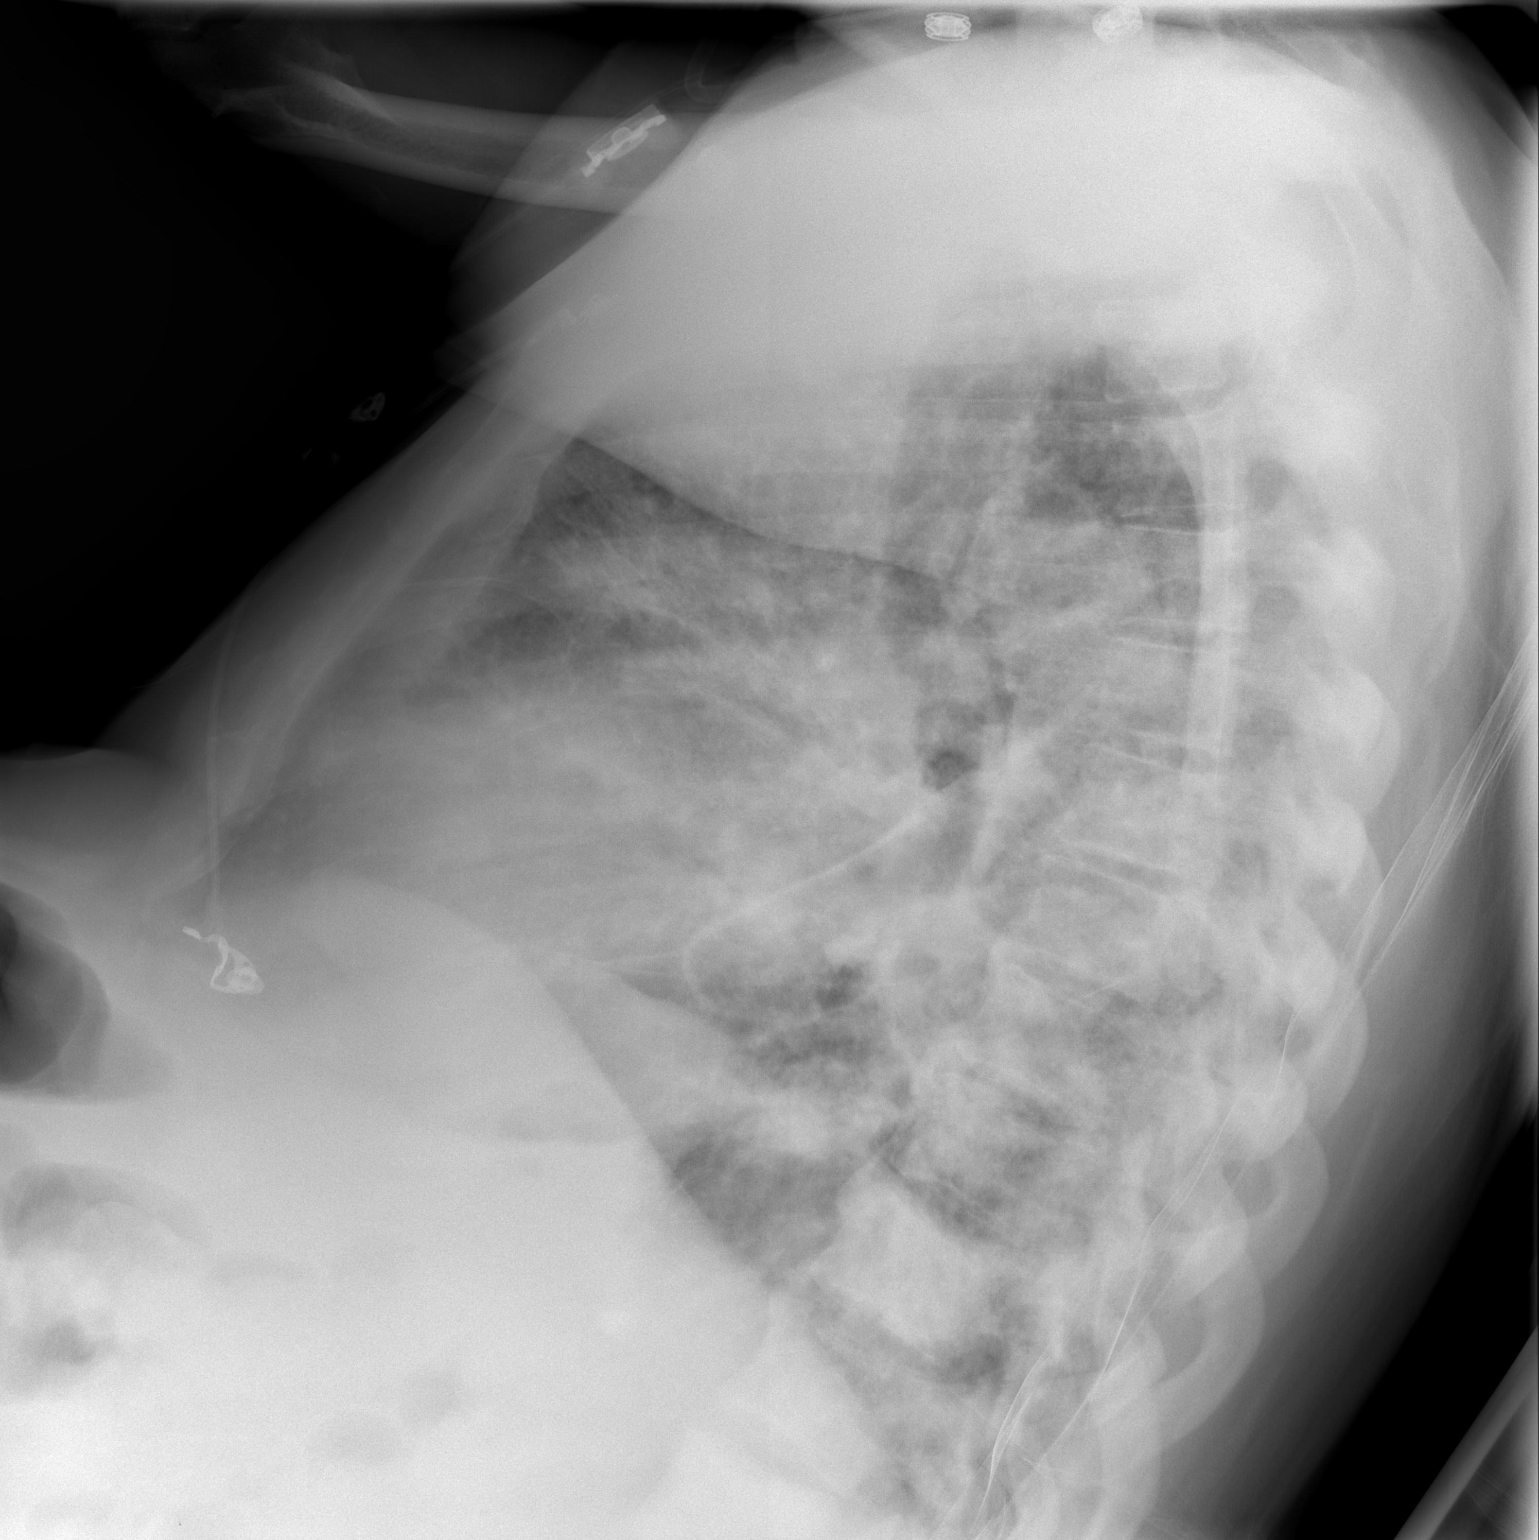

[w chest lat (2 of 2)]
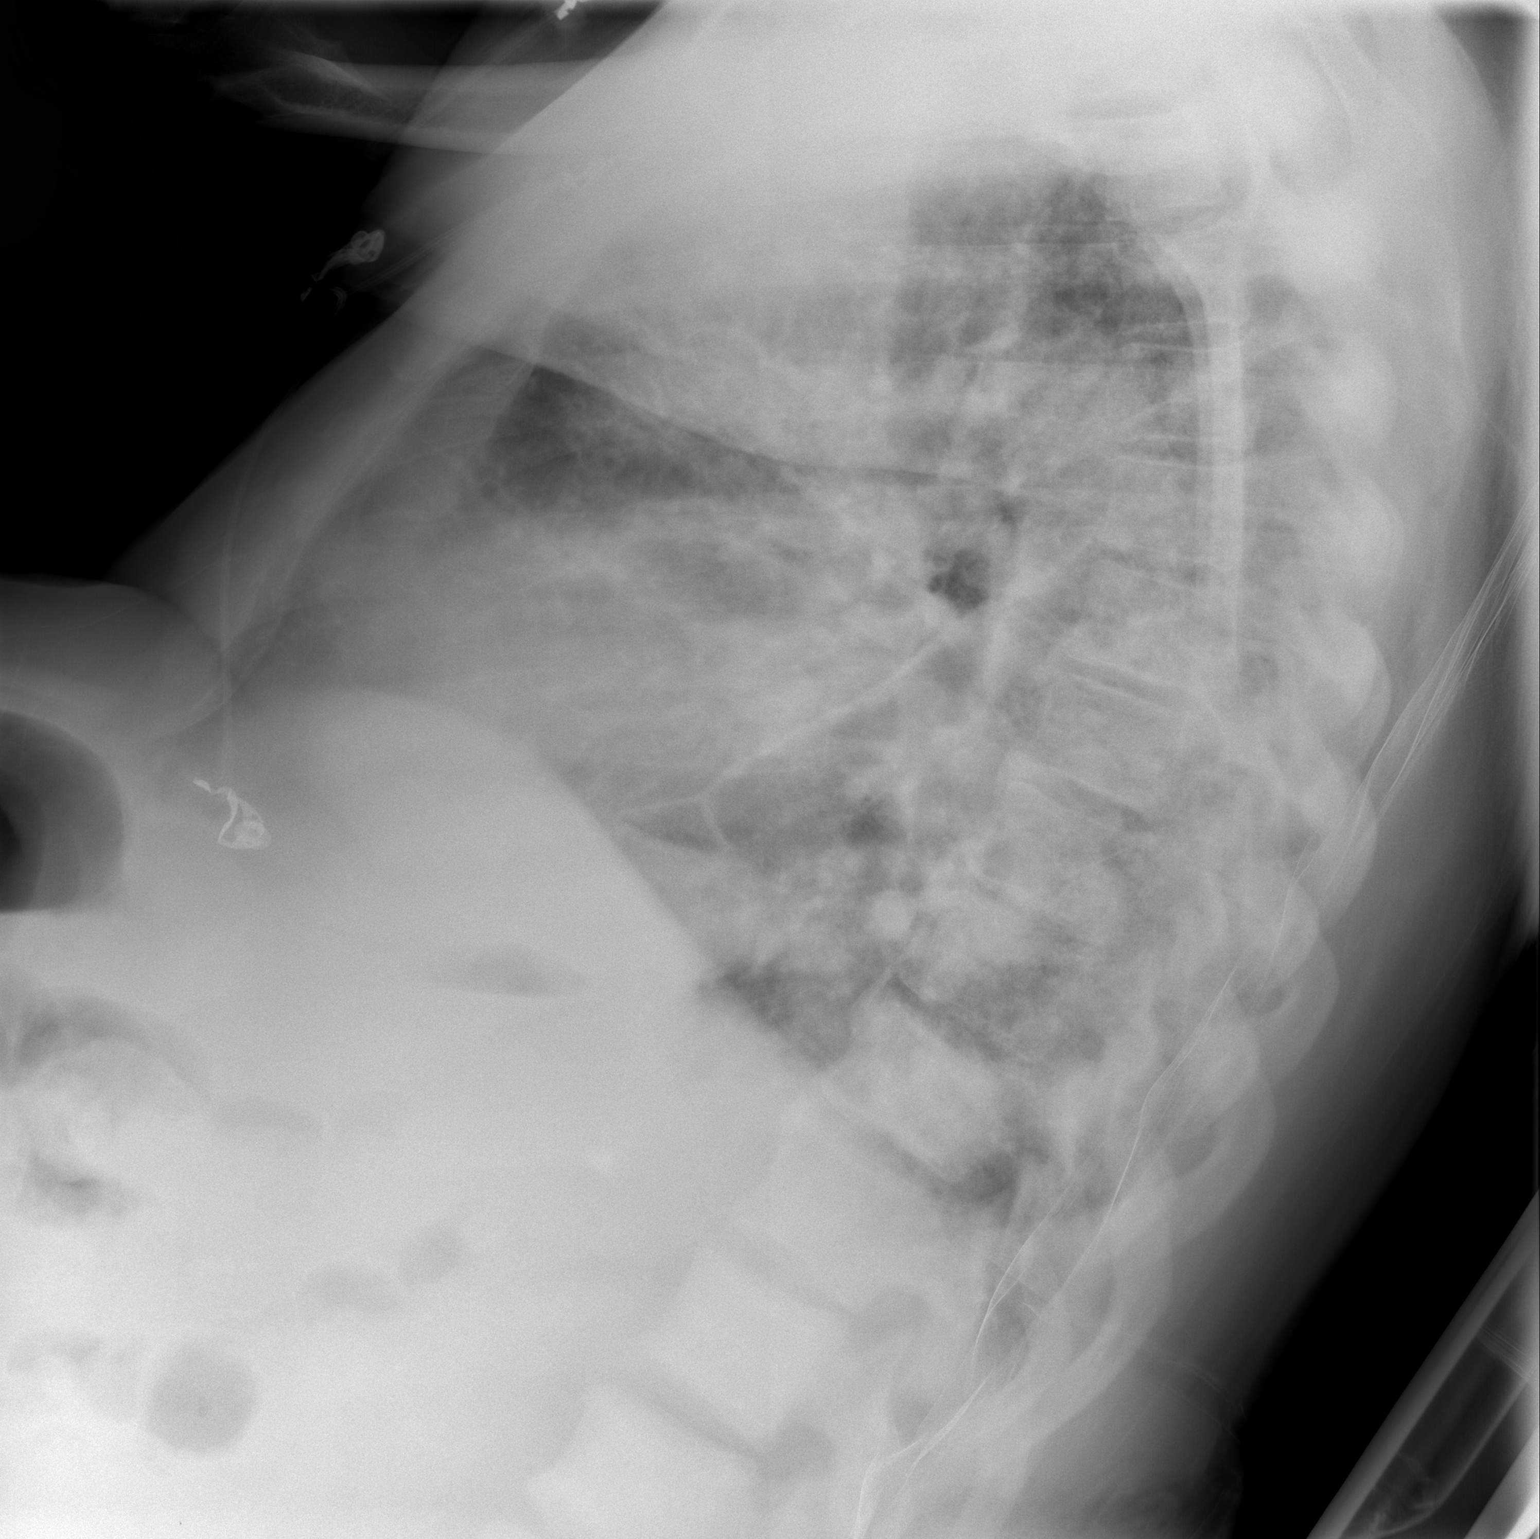

[2 of 2 positions shown; findings below may reference images not displayed]

FINDINGS: Overall, there has been some improvement in diffuse bilateral
interstitial and airspace opacities. However, there remains some
focal airspace opacification within the right mid lung. No pleural
effusion. No pneumothorax. Stable cardiomegaly and mediastinal
contours. Atherosclerotic calcification remains present within the
transverse aorta.
IMPRESSION: 1. Improving pulmonary edema.
2. Residual patchy airspace opacity in the right mid lung in the
region of the minor fissure may represent superimposed
infiltrate/pneumonia, or perhaps some fluid trapped within the minor
fissure.
3. Stable cardiomegaly.
